# Patient Record
Sex: Male | Born: 1945 | Race: Black or African American | Hispanic: No | Marital: Single | Smoking: Never smoker
Health system: Southern US, Community
[De-identification: ages and names within clinical notes are randomized; demographics above are authoritative.]

## PROBLEM LIST (undated history)

## (undated) ENCOUNTER — Ambulatory Visit (HOSPITAL_COMMUNITY): Disposition: A | Discharge status: Other Acute Inpatient Hospital | Payer: Medicare Other

## (undated) DIAGNOSIS — G20 Parkinson's disease: Secondary | ICD-10-CM

## (undated) DIAGNOSIS — G8929 Other chronic pain: Secondary | ICD-10-CM

## (undated) DIAGNOSIS — M79671 Pain in right foot: Secondary | ICD-10-CM

## (undated) DIAGNOSIS — R200 Anesthesia of skin: Secondary | ICD-10-CM

## (undated) DIAGNOSIS — R339 Retention of urine, unspecified: Secondary | ICD-10-CM

## (undated) DIAGNOSIS — M79672 Pain in left foot: Secondary | ICD-10-CM

## (undated) DIAGNOSIS — Z960 Presence of urogenital implants: Secondary | ICD-10-CM

## (undated) DIAGNOSIS — R0602 Shortness of breath: Secondary | ICD-10-CM

## (undated) DIAGNOSIS — G20A1 Parkinson's disease without dyskinesia, without mention of fluctuations: Secondary | ICD-10-CM

## (undated) DIAGNOSIS — E785 Hyperlipidemia, unspecified: Secondary | ICD-10-CM

## (undated) DIAGNOSIS — K4090 Unilateral inguinal hernia, without obstruction or gangrene, not specified as recurrent: Secondary | ICD-10-CM

## (undated) DIAGNOSIS — I10 Essential (primary) hypertension: Secondary | ICD-10-CM

## (undated) DIAGNOSIS — J189 Pneumonia, unspecified organism: Secondary | ICD-10-CM

## (undated) DIAGNOSIS — Z978 Presence of other specified devices: Secondary | ICD-10-CM

## (undated) HISTORY — PX: TONSILLECTOMY: SUR1361

## (undated) HISTORY — DX: Pain in left foot: M79.672

## (undated) HISTORY — DX: Parkinson's disease: G20

## (undated) HISTORY — DX: Essential (primary) hypertension: I10

## (undated) HISTORY — DX: Parkinson's disease without dyskinesia, without mention of fluctuations: G20.A1

## (undated) HISTORY — PX: COLON SURGERY: SHX602

## (undated) HISTORY — DX: Pain in left foot: M79.671

## (undated) HISTORY — DX: Other chronic pain: G89.29

## (undated) HISTORY — DX: Hyperlipidemia, unspecified: E78.5

---

## 1999-09-18 ENCOUNTER — Encounter: Payer: Self-pay | Admitting: Cardiology

## 1999-09-18 ENCOUNTER — Encounter: Admission: RE | Admit: 1999-09-18 | Discharge: 1999-09-18 | Payer: Self-pay | Admitting: Cardiology

## 1999-11-02 ENCOUNTER — Ambulatory Visit (HOSPITAL_COMMUNITY): Admission: RE | Admit: 1999-11-02 | Discharge: 1999-11-02 | Payer: Self-pay | Admitting: Neurosurgery

## 2003-06-26 ENCOUNTER — Emergency Department (HOSPITAL_COMMUNITY): Admission: EM | Admit: 2003-06-26 | Discharge: 2003-06-26 | Payer: Self-pay | Admitting: Family Medicine

## 2004-09-23 ENCOUNTER — Emergency Department (HOSPITAL_COMMUNITY): Admission: EM | Admit: 2004-09-23 | Discharge: 2004-09-23 | Payer: Self-pay | Admitting: Emergency Medicine

## 2005-09-14 ENCOUNTER — Emergency Department (HOSPITAL_COMMUNITY): Admission: EM | Admit: 2005-09-14 | Discharge: 2005-09-14 | Payer: Self-pay | Admitting: Family Medicine

## 2005-10-27 ENCOUNTER — Emergency Department (HOSPITAL_COMMUNITY): Admission: EM | Admit: 2005-10-27 | Discharge: 2005-10-27 | Payer: Self-pay | Admitting: Emergency Medicine

## 2005-11-04 ENCOUNTER — Emergency Department (HOSPITAL_COMMUNITY): Admission: EM | Admit: 2005-11-04 | Discharge: 2005-11-04 | Payer: Self-pay | Admitting: Emergency Medicine

## 2005-12-13 ENCOUNTER — Emergency Department (HOSPITAL_COMMUNITY): Admission: EM | Admit: 2005-12-13 | Discharge: 2005-12-13 | Payer: Self-pay | Admitting: Emergency Medicine

## 2006-06-25 ENCOUNTER — Emergency Department (HOSPITAL_COMMUNITY): Admission: EM | Admit: 2006-06-25 | Discharge: 2006-06-25 | Payer: Self-pay | Admitting: Emergency Medicine

## 2007-05-22 ENCOUNTER — Emergency Department (HOSPITAL_COMMUNITY): Admission: EM | Admit: 2007-05-22 | Discharge: 2007-05-22 | Payer: Self-pay | Admitting: Family Medicine

## 2009-03-14 ENCOUNTER — Emergency Department (HOSPITAL_COMMUNITY): Admission: EM | Admit: 2009-03-14 | Discharge: 2009-03-14 | Payer: Self-pay | Admitting: Emergency Medicine

## 2009-04-15 ENCOUNTER — Encounter: Admission: RE | Admit: 2009-04-15 | Discharge: 2009-04-15 | Payer: Self-pay | Admitting: Cardiology

## 2009-06-02 ENCOUNTER — Emergency Department (HOSPITAL_COMMUNITY): Admission: EM | Admit: 2009-06-02 | Discharge: 2009-06-03 | Payer: Self-pay | Admitting: Emergency Medicine

## 2009-08-09 ENCOUNTER — Emergency Department (HOSPITAL_COMMUNITY): Admission: EM | Admit: 2009-08-09 | Discharge: 2009-08-09 | Payer: Self-pay | Admitting: Emergency Medicine

## 2009-08-20 ENCOUNTER — Emergency Department (HOSPITAL_COMMUNITY): Admission: EM | Admit: 2009-08-20 | Discharge: 2009-08-20 | Payer: Self-pay | Admitting: Emergency Medicine

## 2009-09-05 ENCOUNTER — Emergency Department (HOSPITAL_COMMUNITY): Admission: EM | Admit: 2009-09-05 | Discharge: 2009-09-05 | Payer: Self-pay | Admitting: Emergency Medicine

## 2009-11-29 ENCOUNTER — Ambulatory Visit (HOSPITAL_COMMUNITY): Admission: RE | Admit: 2009-11-29 | Discharge: 2009-11-29 | Payer: Self-pay | Admitting: Gastroenterology

## 2010-03-03 ENCOUNTER — Encounter
Admission: RE | Admit: 2010-03-03 | Discharge: 2010-03-28 | Payer: Self-pay | Source: Home / Self Care | Attending: Physical Medicine & Rehabilitation | Admitting: Physical Medicine & Rehabilitation

## 2010-03-06 ENCOUNTER — Encounter
Admission: RE | Admit: 2010-03-06 | Discharge: 2010-03-06 | Payer: Self-pay | Source: Home / Self Care | Attending: Neurosurgery | Admitting: Neurosurgery

## 2010-03-07 ENCOUNTER — Ambulatory Visit: Admit: 2010-03-07 | Payer: Self-pay | Admitting: Physical Medicine & Rehabilitation

## 2010-03-19 ENCOUNTER — Encounter: Payer: Self-pay | Admitting: Neurosurgery

## 2010-03-19 ENCOUNTER — Encounter: Payer: Self-pay | Admitting: Cardiology

## 2010-04-25 ENCOUNTER — Ambulatory Visit: Payer: Medicaid Other

## 2010-05-04 ENCOUNTER — Ambulatory Visit: Payer: Medicare Other | Attending: Cardiology

## 2010-05-04 DIAGNOSIS — R262 Difficulty in walking, not elsewhere classified: Secondary | ICD-10-CM | POA: Insufficient documentation

## 2010-05-04 DIAGNOSIS — M25569 Pain in unspecified knee: Secondary | ICD-10-CM | POA: Insufficient documentation

## 2010-05-04 DIAGNOSIS — IMO0001 Reserved for inherently not codable concepts without codable children: Secondary | ICD-10-CM | POA: Insufficient documentation

## 2010-05-04 DIAGNOSIS — M25669 Stiffness of unspecified knee, not elsewhere classified: Secondary | ICD-10-CM | POA: Insufficient documentation

## 2010-05-08 ENCOUNTER — Ambulatory Visit: Payer: Medicare Other

## 2010-05-11 ENCOUNTER — Ambulatory Visit: Payer: Medicare Other

## 2010-05-14 LAB — URINALYSIS, ROUTINE W REFLEX MICROSCOPIC
Bilirubin Urine: NEGATIVE
Bilirubin Urine: NEGATIVE
Glucose, UA: NEGATIVE mg/dL
Glucose, UA: NEGATIVE mg/dL
Ketones, ur: NEGATIVE mg/dL
Ketones, ur: NEGATIVE mg/dL
Leukocytes, UA: NEGATIVE
Nitrite: POSITIVE — AB
Nitrite: NEGATIVE
Protein, ur: 30 mg/dL — AB
Protein, ur: 100 mg/dL — AB
Specific Gravity, Urine: 1.008 (ref 1.005–1.030)
Specific Gravity, Urine: 1.013 (ref 1.005–1.030)
Urobilinogen, UA: 1 mg/dL (ref 0.0–1.0)
Urobilinogen, UA: 1 mg/dL (ref 0.0–1.0)
pH: 6.5 (ref 5.0–8.0)
pH: 5.5 (ref 5.0–8.0)

## 2010-05-14 LAB — URINE MICROSCOPIC-ADD ON

## 2010-05-14 LAB — URINE CULTURE: Colony Count: >=100000

## 2010-05-15 LAB — URINE MICROSCOPIC-ADD ON

## 2010-05-15 LAB — URINE CULTURE
Colony Count: NO GROWTH
Culture: NO GROWTH

## 2010-05-15 LAB — URINALYSIS, ROUTINE W REFLEX MICROSCOPIC
Bilirubin Urine: NEGATIVE
Glucose, UA: 250 mg/dL — AB
Ketones, ur: NEGATIVE mg/dL
Leukocytes, UA: NEGATIVE
Nitrite: NEGATIVE
Protein, ur: 100 mg/dL — AB
Specific Gravity, Urine: 1.013 (ref 1.005–1.030)
Urobilinogen, UA: 1 mg/dL (ref 0.0–1.0)
pH: 6.5 (ref 5.0–8.0)

## 2010-05-15 LAB — GLUCOSE, CAPILLARY: Glucose-Capillary: 138 mg/dL — ABNORMAL HIGH (ref 70–99)

## 2010-09-22 ENCOUNTER — Other Ambulatory Visit (HOSPITAL_COMMUNITY): Payer: Self-pay | Admitting: Cardiology

## 2010-10-09 ENCOUNTER — Encounter (HOSPITAL_COMMUNITY)
Admission: RE | Admit: 2010-10-09 | Discharge: 2010-10-09 | Disposition: A | Discharge status: Home / Self Care | Payer: Medicare Other | Source: Ambulatory Visit | Attending: Cardiology | Admitting: Cardiology

## 2010-10-09 DIAGNOSIS — R079 Chest pain, unspecified: Secondary | ICD-10-CM | POA: Insufficient documentation

## 2010-10-09 MED ORDER — TECHNETIUM TC 99M TETROFOSMIN IV KIT
10.0000 | PACK | Freq: Once | INTRAVENOUS | Status: AC | PRN
  Administered 2010-10-09: 10 via INTRAVENOUS

## 2010-10-09 MED ORDER — TECHNETIUM TC 99M TETROFOSMIN IV KIT
30.0000 | PACK | Freq: Once | INTRAVENOUS | Status: AC | PRN
  Administered 2010-10-09: 30 via INTRAVENOUS

## 2010-11-07 ENCOUNTER — Other Ambulatory Visit: Payer: Self-pay | Admitting: Family Medicine

## 2010-11-07 ENCOUNTER — Ambulatory Visit
Admission: RE | Admit: 2010-11-07 | Discharge: 2010-11-07 | Disposition: A | Discharge status: Home / Self Care | Payer: Medicare Other | Source: Ambulatory Visit | Attending: Family Medicine | Admitting: Family Medicine

## 2010-11-07 DIAGNOSIS — M545 Low back pain, unspecified: Secondary | ICD-10-CM

## 2010-11-07 DIAGNOSIS — M542 Cervicalgia: Secondary | ICD-10-CM

## 2010-11-07 DIAGNOSIS — I10 Essential (primary) hypertension: Secondary | ICD-10-CM

## 2010-11-07 DIAGNOSIS — M25569 Pain in unspecified knee: Secondary | ICD-10-CM

## 2011-01-03 ENCOUNTER — Encounter: Payer: Self-pay | Admitting: Family Medicine

## 2011-01-05 ENCOUNTER — Ambulatory Visit (INDEPENDENT_AMBULATORY_CARE_PROVIDER_SITE_OTHER): Payer: Medicare Other | Admitting: Family Medicine

## 2011-01-05 ENCOUNTER — Encounter: Payer: Self-pay | Admitting: Family Medicine

## 2011-01-05 DIAGNOSIS — I10 Essential (primary) hypertension: Secondary | ICD-10-CM | POA: Insufficient documentation

## 2011-01-05 DIAGNOSIS — F419 Anxiety disorder, unspecified: Secondary | ICD-10-CM

## 2011-01-05 DIAGNOSIS — F411 Generalized anxiety disorder: Secondary | ICD-10-CM

## 2011-01-05 DIAGNOSIS — G8929 Other chronic pain: Secondary | ICD-10-CM

## 2011-01-05 DIAGNOSIS — E119 Type 2 diabetes mellitus without complications: Secondary | ICD-10-CM

## 2011-01-05 DIAGNOSIS — R7303 Prediabetes: Secondary | ICD-10-CM | POA: Insufficient documentation

## 2011-01-05 LAB — POCT GLYCOSYLATED HEMOGLOBIN (HGB A1C): Hemoglobin A1C: 7.5

## 2011-01-05 MED ORDER — HYDROCHLOROTHIAZIDE 25 MG PO TABS: 25.0000 mg | ORAL_TABLET | Freq: Every day | ORAL | Status: DC

## 2011-01-05 NOTE — Assessment & Plan Note (Addendum)
Patient does not know why he takes Klonopin. He thinks that he may have been anxious at one point.  Last took Klonopin this morning. Will address this issue at the next visit in 2-4 weeks

## 2011-01-05 NOTE — Assessment & Plan Note (Signed)
Not well-controlled currently on ARB and calcium channel blocker.  Plan add hydrochlorothiazide and recheck in 2 weeks. We'll also check comprehensive metabolic panel today.

## 2011-01-05 NOTE — Assessment & Plan Note (Signed)
A1c in range. No changes planned. Followup in 3 months or sooner

## 2011-01-05 NOTE — Patient Instructions (Signed)
Thank you for coming in today. We will get some blood and urine today for labs.  Please start taking HCTZ too.  Come see me in 2-4 weeks to follow up your blood pressure and your pain.  We will treat your chronic pain but we will do it by the book.  First we need to get a urine drug screen.

## 2011-01-05 NOTE — Assessment & Plan Note (Addendum)
Has not been taking Vicodin for several months. At this point I cannot confirm any of his story. Will obtain urine drug screen today. [facility] drug database reviewed, and scanned into record.  Only clonazepam in the last year. In return visit in 2-4 weeks will discuss pain control options. At this point I am reluctant to start narcotics until a trial of physical therapy has happened. I will be happy to continue to prescribe tramadol.

## 2011-01-05 NOTE — Progress Notes (Addendum)
Here to meet the new doctor and to discuss his HTN, DM, and Chronic Pain.   1) HTN:  Taking medications as listed below. Denies any palpitations chest pains dyspnea syncope or edema. Feels well otherwise  2) diabetes: Taking medications listed below isn't checking sugar. Denies any symptoms such as polyuria or polydipsia. Feels well otherwise. Lab Results  Component Value Date   HGBA1C 7.5 01/05/2011    3) chronic pain: Previously was managed by other physicians with Vicodin and tramadol.  Cannot remember if he's ever been to physical therapy. Denies any surgeries. Denies any weakness numbness tingling in his legs. He notes that his pain occurs in his lumbar back neck and bilateral knees.  Denies any history of drug abuse.   PMH reviewed.  ROS as above otherwise neg Medications reviewed. Current Outpatient Prescriptions  Medication Sig Dispense Refill  . amLODipine-benazepril (LOTREL) 10-20 MG per capsule Take 1 capsule by mouth daily.        . clonazePAM (KLONOPIN) 0.5 MG tablet Take 0.5 mg by mouth 2 (two) times daily as needed.        Marland Kitchen glimepiride (AMARYL) 4 MG tablet Take 4 mg by mouth daily before breakfast.        . HYDROcodone-acetaminophen (LORTAB) 7.5-500 MG per tablet Take 1 tablet by mouth every 6 (six) hours as needed.        Marland Kitchen ibuprofen (ADVIL,MOTRIN) 800 MG tablet Take 800 mg by mouth every 8 (eight) hours as needed.        . metFORMIN (GLUCOPHAGE) 1000 MG tablet Take 1,000 mg by mouth 2 (two) times daily with a meal.        . metoprolol (TOPROL XL) 50 MG 24 hr tablet Take 50 mg by mouth daily.       . traMADol (ULTRAM) 50 MG tablet Take 50 mg by mouth 2 (two) times daily as needed. Maximum dose= 8 tablets per day       . hydrochlorothiazide (HYDRODIURIL) 25 MG tablet Take 1 tablet (25 mg total) by mouth daily.  30 tablet  1     Exam:  BP 170/100  Pulse 85  Ht 5\' 10"  (1.778 m)  Wt 219 lb (99.338 kg)  BMI 31.42 kg/m2 Gen: Well NAD HEENT: EOMI,  MMM Lungs: CTABL Nl  WOB Heart: RRR no MRG Abd: NABS, NT, ND Exts: Non edematous BL  LE, warm and well perfused. Diabetic foot exam shows callus buildup dead skin buildup and no monofilament sensation bilaterally. No ulcers noted MSK: Nontender over entire spinal midline. Bilateral paraspinal tenderness along the lumbar sacral region bilaterally. Normal gait normal reflexes and sensation in legs bilaterally. Patient is wearing a back brace.

## 2011-01-06 LAB — COMPLETE METABOLIC PANEL WITH GFR
ALT: 19 U/L (ref 0–53)
AST: 23 U/L (ref 0–37)
Albumin: 3.9 g/dL (ref 3.5–5.2)
Alkaline Phosphatase: 68 U/L (ref 39–117)
BUN: 9 mg/dL (ref 6–23)
CO2: 29 mEq/L (ref 19–32)
Calcium: 9.9 mg/dL (ref 8.4–10.5)
Chloride: 101 mEq/L (ref 96–112)
Creat: 0.98 mg/dL (ref 0.50–1.35)
GFR, Est African American: >89 mL/min (ref >89)
GFR, Est Non African American: 82 mL/min — ABNORMAL LOW (ref >89)
Glucose, Bld: 185 mg/dL — ABNORMAL HIGH (ref 70–99)
Potassium: 3.1 mEq/L — ABNORMAL LOW (ref 3.5–5.3)
Sodium: 142 mEq/L (ref 135–145)
Total Bilirubin: 0.5 mg/dL (ref 0.3–1.2)
Total Protein: 7.3 g/dL (ref 6.0–8.3)

## 2011-01-06 LAB — DRUG SCREEN, URINE
Amphetamine Screen, Ur: NEGATIVE
Barbiturate Quant, Ur: NEGATIVE
Benzodiazepines.: NEGATIVE
Cocaine Metabolites: NEGATIVE
Creatinine,U: 119.2 mg/dL
Marijuana Metabolite: NEGATIVE
Methadone: NEGATIVE
Opiates: NEGATIVE
Phencyclidine (PCP): NEGATIVE
Propoxyphene: NEGATIVE

## 2011-01-26 ENCOUNTER — Telehealth: Payer: Self-pay | Admitting: Family Medicine

## 2011-01-26 ENCOUNTER — Encounter: Payer: Self-pay | Admitting: Family Medicine

## 2011-01-26 ENCOUNTER — Ambulatory Visit (INDEPENDENT_AMBULATORY_CARE_PROVIDER_SITE_OTHER): Payer: Medicare Other | Admitting: Family Medicine

## 2011-01-26 DIAGNOSIS — Z23 Encounter for immunization: Secondary | ICD-10-CM

## 2011-01-26 DIAGNOSIS — L989 Disorder of the skin and subcutaneous tissue, unspecified: Secondary | ICD-10-CM | POA: Insufficient documentation

## 2011-01-26 DIAGNOSIS — G8929 Other chronic pain: Secondary | ICD-10-CM

## 2011-01-26 DIAGNOSIS — E119 Type 2 diabetes mellitus without complications: Secondary | ICD-10-CM

## 2011-01-26 DIAGNOSIS — I10 Essential (primary) hypertension: Secondary | ICD-10-CM

## 2011-01-26 DIAGNOSIS — B353 Tinea pedis: Secondary | ICD-10-CM

## 2011-01-26 DIAGNOSIS — N529 Male erectile dysfunction, unspecified: Secondary | ICD-10-CM

## 2011-01-26 MED ORDER — AMLODIPINE BESY-BENAZEPRIL HCL 10-20 MG PO CAPS: 1.0000 | ORAL_CAPSULE | Freq: Every day | ORAL | Status: DC

## 2011-01-26 MED ORDER — KETOCONAZOLE 2 % EX CREA: TOPICAL_CREAM | Freq: Every day | CUTANEOUS | Status: DC

## 2011-01-26 MED ORDER — TRAMADOL HCL 50 MG PO TABS: 50.0000 mg | ORAL_TABLET | Freq: Three times a day (TID) | ORAL | Status: DC | PRN

## 2011-01-26 MED ORDER — HYDROCODONE-ACETAMINOPHEN 7.5-750 MG PO TABS: 1.0000 | ORAL_TABLET | Freq: Every day | ORAL | Status: DC | PRN

## 2011-01-26 NOTE — Assessment & Plan Note (Signed)
See DM section for discussion. Will follow.

## 2011-01-26 NOTE — Patient Instructions (Addendum)
Thank you for coming in today. Please start taking all you blood pressure medications.  Amlodipine/Benazepril, Hydrochlorathioazide, and Metoprolol.  I prescribed hydrocodone 7.5/750 with and tramadol with refills.  Clean and inspect your feet daily.  I prescribed some ketoconazole cream to use every day to keep the fungus down.  Come see me in 1-2 months for follow up.

## 2011-01-26 NOTE — Assessment & Plan Note (Signed)
Organic impotence. We'll fill out form for vacuum erection system, and send copy of note in with it. We'll followup in one or 2 months

## 2011-01-26 NOTE — Assessment & Plan Note (Signed)
Blood pressure significantly elevated today. However he is asymptomatic. I think this is likely due to medication nonadherence. Plan to repeat amlodipine/benazepril and emphasize the importance of taking all of his blood pressure medications. We carefully reviewed what his medicines are and what to do. Plan to followup in one to 2 months.

## 2011-01-26 NOTE — Assessment & Plan Note (Addendum)
Chronic pain. Urine drug screen and [place] and drug database appear to be consistent with his story. Plan to prescribe tramadol +30 tablets of hydrocodone 7.5/750. Encouraged patient to reduce dose of ibuprofen as this is interfering with his blood pressure control. Plan to followup in one to 2 months Prescription use agreement filled out and scanned into chart

## 2011-01-26 NOTE — Progress Notes (Signed)
Addended by: Jennette Bill on: 01/26/2011 11:12 AM   Modules accepted: Orders

## 2011-01-26 NOTE — Assessment & Plan Note (Signed)
Diabetes is well controlled on metformin and Amaryl. However his feet are not good condition. He doesn't have any ulcers but does have tinea in his feet.  Plan: Discussed good foot hygiene and prescribe ketoconazole for tinea. Followup in one month

## 2011-01-26 NOTE — Telephone Encounter (Signed)
Have form for penis pump. We need to schedule an appointment to justify this device for medicare. Will need an appt. I left a message stating this.

## 2011-01-26 NOTE — Progress Notes (Signed)
Mr. Tyler Lester presents to clinic today for blood pressure, impotence, back pain, diabetes.  #1 blood pressure: Taking hydrochlorothiazide and perhaps metoprolol but is not taking amlodipine/benazepril.  No palpitations dyspnea or significant edema. No syncope either. Feels well has trouble recalling which medicines to which.  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 He has no erections in the morning and is not masturbating.  He thinks a penis pump may help.    #3 chronic back pain. Tramadol does help but not quite enough. He thinks hydrocodone 7.5/750 one tablet a day as needed is sufficient in addition to his tramadol. He additionally is taking ibuprofen for back pain. He denies any radiculopathy weakness or numbness. He additionally denies any bladder or bowel dysfunction.  #4 diabetes: Taking metformin and Amaryl. His last A1c was 7.5 in early November.  No lows polyuria or polydipsia.  He does not inspect his feet and has not been to a podiatrist.  PMH reviewed.  ROS as above otherwise neg Medications reviewed. Current Outpatient Prescriptions  Medication Sig Dispense Refill  . clonazePAM (KLONOPIN) 0.5 MG tablet Take 0.5 mg by mouth 2 (two) times daily as needed.        Marland Kitchen glimepiride (AMARYL) 4 MG tablet Take 4 mg by mouth daily before breakfast.        . hydrochlorothiazide (HYDRODIURIL) 25 MG tablet Take 1 tablet (25 mg total) by mouth daily.  30 tablet  1  . ibuprofen (ADVIL,MOTRIN) 800 MG tablet Take 800 mg by mouth every 8 (eight) hours as needed.        . metFORMIN (GLUCOPHAGE) 1000 MG tablet Take 1,000 mg by mouth 2 (two) times daily with a meal.        . metoprolol (TOPROL XL) 50 MG 24 hr tablet Take 50 mg by mouth daily.       . traMADol (ULTRAM) 50 MG tablet Take 1 tablet (50 mg total) by mouth every 8 (eight) hours as needed. Maximum dose= 8 tablets per day  60 tablet  1  . DISCONTD: traMADol (ULTRAM) 50 MG tablet Take 50 mg by mouth 2  (two) times daily as needed. Maximum dose= 8 tablets per day       . amLODipine-benazepril (LOTREL) 10-20 MG per capsule Take 1 capsule by mouth daily.  30 capsule  3  . HYDROcodone-acetaminophen (VICODIN ES) 7.5-750 MG per tablet Take 1 tablet by mouth daily as needed for pain.  30 tablet  1  . ketoconazole (NIZORAL) 2 % cream Apply topically daily.  75 g  12  . DISCONTD: amLODipine-benazepril (LOTREL) 10-20 MG per capsule Take 1 capsule by mouth daily.        Marland Kitchen DISCONTD: HYDROcodone-acetaminophen (LORTAB) 7.5-500 MG per tablet Take 1 tablet by mouth every 6 (six) hours as needed.          Exam:  BP 180/96  Pulse 80  Temp(Src) 97.9 F (36.6 C) (Oral)  Ht 5\' 10"  (1.778 m)  Wt 219 lb (99.338 kg)  BMI 31.42 kg/m2 Gen: Well NAD HEENT: EOMI,  MMM Lungs: CTABL Nl WOB Heart: RRR no MRG Abd: NABS, NT, ND Exts: Feet with tinea bilaterally.  Thickened nails. 0/4 to monofilament filament on right and 2/4 to monofilament on left. No ulcers noted dorsal pedis and posterior tibialis pulses are 2+ bilaterally. MSK: Nontender over spinal midline. Bilateral paraspinal lumbar tenderness to palpation. Strength reflexes and sensation in both legs are normal and equal. Normal  gait.

## 2011-02-01 ENCOUNTER — Telehealth: Payer: Self-pay | Admitting: Family Medicine

## 2011-02-01 NOTE — Telephone Encounter (Signed)
[name], Renato Battles

## 2011-02-01 NOTE — Telephone Encounter (Signed)
Pt called asking for a replacement on his vicodin, says he misplaced it, advised pt we cannot replace lost/stolen prescriptions per our practice policy, pt says he will keep looking, sending this as an [place]

## 2011-02-21 ENCOUNTER — Telehealth: Payer: Self-pay | Admitting: Family Medicine

## 2011-02-21 MED ORDER — PANTOPRAZOLE SODIUM 40 MG PO TBEC: 40.0000 mg | DELAYED_RELEASE_TABLET | Freq: Every day | ORAL | Status: DC

## 2011-02-21 NOTE — Telephone Encounter (Signed)
Required for Nexium. Form given to Dr. Denyse Amass

## 2011-02-22 NOTE — Telephone Encounter (Signed)
Patient notified that Protonix is ready at pharmacy.

## 2011-03-02 ENCOUNTER — Other Ambulatory Visit: Payer: Self-pay | Admitting: Family Medicine

## 2011-03-02 MED ORDER — SILDENAFIL CITRATE 100 MG PO TABS: 50.0000 mg | ORAL_TABLET | Freq: Every day | ORAL | Status: DC | PRN

## 2011-03-12 ENCOUNTER — Ambulatory Visit: Payer: Medicare Other | Admitting: Family Medicine

## 2011-03-16 ENCOUNTER — Ambulatory Visit: Payer: Medicare Other | Admitting: Family Medicine

## 2011-03-26 ENCOUNTER — Ambulatory Visit: Payer: Medicare Other | Admitting: Family Medicine

## 2011-03-28 ENCOUNTER — Encounter: Payer: Self-pay | Admitting: Family Medicine

## 2011-03-28 ENCOUNTER — Ambulatory Visit (INDEPENDENT_AMBULATORY_CARE_PROVIDER_SITE_OTHER): Payer: Medicare Other | Admitting: Family Medicine

## 2011-03-28 VITALS — BP 158/83 | HR 96 | Ht 68.0 in | Wt 214.0 lb

## 2011-03-28 DIAGNOSIS — E1149 Type 2 diabetes mellitus with other diabetic neurological complication: Secondary | ICD-10-CM

## 2011-03-28 DIAGNOSIS — I10 Essential (primary) hypertension: Secondary | ICD-10-CM

## 2011-03-28 DIAGNOSIS — G8929 Other chronic pain: Secondary | ICD-10-CM

## 2011-03-28 DIAGNOSIS — E1142 Type 2 diabetes mellitus with diabetic polyneuropathy: Secondary | ICD-10-CM

## 2011-03-28 MED ORDER — HYDROCODONE-ACETAMINOPHEN 7.5-750 MG PO TABS: 1.0000 | ORAL_TABLET | Freq: Every day | ORAL | Status: DC | PRN

## 2011-03-28 NOTE — Assessment & Plan Note (Signed)
Chronic pain doing well on low-dose intermittent Vicodin. Plan to refill today 30 tabs with one refill.  Followup in one month.  If his medication needs remain stable I feel this is a safe level for this practice.  Discuss red flags such as bowel or bladder problems weakness or numbness.

## 2011-03-28 NOTE — Patient Instructions (Addendum)
Thank you for coming in today. Get a pill box and make sure you are taking your medicines.  Come back in 1 month for a recheck.  I would like to make sure you blood pressure is not good on all your medicines before we start adding some.  I refilled your pain medicine.  Come back in 1 month for a diabetes check then too.

## 2011-03-28 NOTE — Progress Notes (Signed)
Tyler Lester is a 66 y.o. male who presents to [facility] today for   1) hypertension:  He is pretty sure that he's taking his blood pressure medicines however he can't name any of them and does not use a pillbox.  He feels well however without any chest pains palpitations dyspnea.    2) diabetes: Takes his Amaryl and metformin. No hyperglycemic episodes no hypoglycemia feels well. Lab Results  Component Value Date   HGBA1C 7.5 01/05/2011    3) chronic back pain  doing well with ibuprofen and intermittent Vicodin. He says that he uses Vicodin one to 2 pills every other day as needed. He denies any worsening pain bowel or bladder dysfunction constipation nausea vomiting. He says that his pain medications allow him to remain functional at home and exercise.  He feels well.   PMH reviewed. Significant for diabetes hypertension and chronic pain ROS as above otherwise neg Medications reviewed. Current Outpatient Prescriptions  Medication Sig Dispense Refill  . amLODipine-benazepril (LOTREL) 10-20 MG per capsule Take 1 capsule by mouth daily.  30 capsule  3  . clonazePAM (KLONOPIN) 0.5 MG tablet Take 0.5 mg by mouth 2 (two) times daily as needed.        Marland Kitchen glimepiride (AMARYL) 4 MG tablet Take 4 mg by mouth daily before breakfast.        . hydrochlorothiazide (HYDRODIURIL) 25 MG tablet Take 25 mg by mouth daily.      Marland Kitchen HYDROcodone-acetaminophen (VICODIN ES) 7.5-750 MG per tablet Take 1 tablet by mouth daily as needed for pain.  30 tablet  1  . ibuprofen (ADVIL,MOTRIN) 800 MG tablet Take 800 mg by mouth every 8 (eight) hours as needed.        Marland Kitchen ketoconazole (NIZORAL) 2 % cream Apply topically daily.  75 g  12  . metFORMIN (GLUCOPHAGE) 1000 MG tablet Take 1,000 mg by mouth 2 (two) times daily with a meal.        . metoprolol (TOPROL XL) 50 MG 24 hr tablet Take 50 mg by mouth daily.       . pantoprazole (PROTONIX) 40 MG tablet Take 1 tablet (40 mg total) by mouth daily.  30 tablet  12  . sildenafil  (VIAGRA) 100 MG tablet Take 0.5-1 tablets (50-100 mg total) by mouth daily as needed for erectile dysfunction.  5 tablet  11  . traMADol (ULTRAM) 50 MG tablet Take 1 tablet (50 mg total) by mouth every 8 (eight) hours as needed. Maximum dose= 8 tablets per day  60 tablet  1  . DISCONTD: HYDROcodone-acetaminophen (VICODIN ES) 7.5-750 MG per tablet Take 1 tablet by mouth daily as needed for pain.  30 tablet  1    Exam:  BP 158/83  Pulse 96  Ht 5\' 8"  (1.727 m)  Wt 214 lb (97.07 kg)  BMI 32.54 kg/m2 Gen: Well NAD HEENT: EOMI,  MMM Lungs: CTABL Nl WOB Heart: RRR no MRG Abd: NABS, NT, ND MSK: Nontender over spinal midline. Bilateral paraspinal lumbar tenderness to palpation. Strength reflexes and sensation in both legs are normal and equal. Normal gait.

## 2011-03-28 NOTE — Assessment & Plan Note (Signed)
A1c at goal in November. He is due again in February 9 for a repeat A1c. Plan to continue metformin and Amaryl.  Followup in one month

## 2011-03-28 NOTE — Assessment & Plan Note (Signed)
Blood pressure never well controlled today on multiple drug therapy. I am very highly suspicious of accidental medication nonadherence.  He is on multiple medicines and does not use a pillbox and cannot really name his blood pressure medications.  Encouraged patient strongly to use a pillbox to ensure compliance with drug therapy. Plan to followup in one month. We'll consider increasing her Toprol at that point as his heart rate is in the 90s.  Patient expresses understanding

## 2011-03-30 ENCOUNTER — Other Ambulatory Visit: Payer: Self-pay | Admitting: Family Medicine

## 2011-03-30 MED ORDER — PANTOPRAZOLE SODIUM 40 MG PO TBEC: 40.0000 mg | DELAYED_RELEASE_TABLET | Freq: Every day | ORAL | Status: DC

## 2011-05-14 ENCOUNTER — Ambulatory Visit: Payer: Medicare Other | Admitting: Family Medicine

## 2011-05-17 ENCOUNTER — Ambulatory Visit (INDEPENDENT_AMBULATORY_CARE_PROVIDER_SITE_OTHER): Payer: Medicare Other | Admitting: Family Medicine

## 2011-05-17 VITALS — BP 123/76 | HR 80 | Temp 97.9°F | Ht 68.0 in | Wt 216.4 lb

## 2011-05-17 DIAGNOSIS — H60399 Other infective otitis externa, unspecified ear: Secondary | ICD-10-CM

## 2011-05-17 DIAGNOSIS — H6090 Unspecified otitis externa, unspecified ear: Secondary | ICD-10-CM

## 2011-05-17 DIAGNOSIS — J069 Acute upper respiratory infection, unspecified: Secondary | ICD-10-CM | POA: Insufficient documentation

## 2011-05-17 MED ORDER — HYDROCORTISONE-ACETIC ACID 1-2 % OT SOLN: 4.0000 [drp] | Freq: Three times a day (TID) | OTIC | Status: AC

## 2011-05-17 NOTE — Assessment & Plan Note (Signed)
Left otitis externa.  Vosol HC, advised to discontinue qtip sue.

## 2011-05-17 NOTE — Patient Instructions (Signed)
I sent ear drops to your pharmacy for your left ear  Cough and nasal congestion are due to cold virus.  Please let us know if you have worsening cough or start to have fever, or doe not improve.  Upper Respiratory Infection, Adult An upper respiratory infection (URI) is also known as the common cold. It is often caused by a type of germ (virus). Colds are easily spread (contagious). You can pass it to others by kissing, coughing, sneezing, or drinking out of the same glass. Usually, you get better in 1 or 2 weeks.   HOME CARE    Only take medicine as told by your doctor.   Use a warm mist humidifier or breathe in steam from a hot shower.   Drink enough water and fluids to keep your pee (urine) clear or pale yellow.   Get plenty of rest.   Return to work when your temperature is back to normal or as told by your doctor. You may use a face mask and wash your hands to stop your cold from spreading.  GET HELP RIGHT AWAY IF:    After the first few days, you feel you are getting worse.   You have questions about your medicine.   You have chills, shortness of breath, or brown or red spit (mucus).   You have yellow or brown snot (nasal discharge) or pain in the face, especially when you bend forward.   You have a fever, puffy (swollen) neck, pain when you swallow, or white spots in the back of your throat.   You have a bad headache, ear pain, sinus pain, or chest pain.   You have a high-pitched whistling sound when you breathe in and out (wheezing).   You have a lasting cough or cough up blood.   You have sore muscles or a stiff neck.  MAKE SURE YOU:    Understand these instructions.   Will watch your condition.   Will get help right away if you are not doing well or get worse.  Document Released: 08/01/2007 Document Revised: 02/01/2011 Document Reviewed: 06/19/2010 [facility] Patient Information 2012 [place], [place].

## 2011-05-17 NOTE — Assessment & Plan Note (Signed)
Viral, Discussed symptomatic treatment and red flags for return.

## 2011-05-17 NOTE — Progress Notes (Signed)
  Subjective:    Patient ID: Tyler Lester, male    DOB: 28-Jun-1945, 66 y.o.   MRN: [id number]  HPI 30 minutes late for workin appt  approx  Week of nasal congestion, facial pressure.  Some cough, nonproductive,  No fever, chills.  Some dyspnea.  No emesis or diarrhea.  I have reviewed patient's  PMH, FH, and Social history and Medications as related to this visit.    Review of SystemsGeneral:  Negative for fever, chills, malaise, myalgias         Objective:   Physical Exam GEN: Alert & Oriented, No acute distress, psychomotor slowing HEENT: [place]/AT. EOMI, PERRLA- 2mm-, no conjunctival injection or scleral icterus.  Bilateral tympanic membranes intact without erythema or effusion.  Left canal with otitis externa.  Nares without edema or rhinorrhea.  Oropharynx is without erythema or exudates.  No anterior or posterior cervical lymphadenopathy. CV:  Regular Rate & Rhythm, no murmur Respiratory:  Normal work of breathing, CTAB        Assessment & Plan:

## 2011-05-29 ENCOUNTER — Telehealth: Payer: Self-pay | Admitting: Family Medicine

## 2011-05-29 NOTE — Telephone Encounter (Signed)
Fwd to PCP to address. Lorenda Hatchet, Renato Battles

## 2011-05-29 NOTE — Telephone Encounter (Signed)
Patient is calling because [facility] has not heard back on the refill for Hydrocodone.  He said they have been faxing it since last week Thursday.

## 2011-05-30 NOTE — Telephone Encounter (Signed)
Pt has appt 4/9 w/Dr Denyse Amass

## 2011-05-30 NOTE — Telephone Encounter (Signed)
He never followed up with me. No more refills until a visit. Route to red team to call patient.

## 2011-05-30 NOTE — Telephone Encounter (Signed)
LMOM for pt to rt call. Please let pt know msg per Dr Denyse Amass.

## 2011-06-05 ENCOUNTER — Ambulatory Visit: Payer: Medicare Other | Admitting: Family Medicine

## 2011-06-08 ENCOUNTER — Telehealth: Payer: Self-pay | Admitting: *Deleted

## 2011-06-08 NOTE — Telephone Encounter (Signed)
Received request for PCP change form.  Form states patient is requesting Dr. Armen Pickup and no reason listed for requested change.  Called patient and left message to call our office back.  Gaylene Brooks, RN

## 2011-06-15 ENCOUNTER — Ambulatory Visit: Payer: Medicare Other | Admitting: Family Medicine

## 2011-06-20 NOTE — Telephone Encounter (Signed)
Called patient and left message to call our office back.  Trason Shifflet Ann, RN  

## 2011-06-20 NOTE — Telephone Encounter (Signed)
Patient called back.  Patient was question about reason for form request and stated that he takes Vicodin for pain and needs to get refill.  Last office visit was 02/2011.  Scheduled follow-up appt with Dr. Denyse Amass for 07/04/11 at 3:15pm for diabetes/pain med.  Can disregard PCP change request and patient will continue to see Dr. Denyse Amass.  Gaylene Brooks, RN

## 2011-06-20 NOTE — Telephone Encounter (Signed)
OK will follow up

## 2011-06-29 ENCOUNTER — Ambulatory Visit: Payer: Medicare Other | Admitting: Family Medicine

## 2011-07-04 ENCOUNTER — Ambulatory Visit (INDEPENDENT_AMBULATORY_CARE_PROVIDER_SITE_OTHER): Payer: Medicare Other | Admitting: Family Medicine

## 2011-07-04 ENCOUNTER — Encounter: Payer: Self-pay | Admitting: Family Medicine

## 2011-07-04 VITALS — BP 185/98 | HR 82 | Ht 68.0 in | Wt 223.0 lb

## 2011-07-04 DIAGNOSIS — I10 Essential (primary) hypertension: Secondary | ICD-10-CM

## 2011-07-04 DIAGNOSIS — E1149 Type 2 diabetes mellitus with other diabetic neurological complication: Secondary | ICD-10-CM

## 2011-07-04 DIAGNOSIS — E1142 Type 2 diabetes mellitus with diabetic polyneuropathy: Secondary | ICD-10-CM

## 2011-07-04 DIAGNOSIS — K219 Gastro-esophageal reflux disease without esophagitis: Secondary | ICD-10-CM | POA: Insufficient documentation

## 2011-07-04 DIAGNOSIS — R5381 Other malaise: Secondary | ICD-10-CM

## 2011-07-04 DIAGNOSIS — R5383 Other fatigue: Secondary | ICD-10-CM

## 2011-07-04 DIAGNOSIS — G8929 Other chronic pain: Secondary | ICD-10-CM

## 2011-07-04 LAB — TSH: TSH: 1.172 u[IU]/mL (ref 0.350–4.500)

## 2011-07-04 LAB — POCT GLYCOSYLATED HEMOGLOBIN (HGB A1C): Hemoglobin A1C: 7.2

## 2011-07-04 LAB — LDL CHOLESTEROL, DIRECT: Direct LDL: 85 mg/dL

## 2011-07-04 MED ORDER — HYDROCHLOROTHIAZIDE 25 MG PO TABS: ORAL_TABLET | ORAL | Status: DC

## 2011-07-04 MED ORDER — METOPROLOL SUCCINATE ER 50 MG PO TB24: ORAL_TABLET | ORAL | Status: DC

## 2011-07-04 MED ORDER — TRAMADOL HCL 50 MG PO TABS: 50.0000 mg | ORAL_TABLET | Freq: Three times a day (TID) | ORAL | Status: DC | PRN

## 2011-07-04 MED ORDER — AMLODIPINE BESY-BENAZEPRIL HCL 10-20 MG PO CAPS: ORAL_CAPSULE | ORAL | Status: DC

## 2011-07-04 MED ORDER — GLIMEPIRIDE 4 MG PO TABS: ORAL_TABLET | ORAL | Status: DC

## 2011-07-04 MED ORDER — PANTOPRAZOLE SODIUM 40 MG PO TBEC: 40.0000 mg | DELAYED_RELEASE_TABLET | Freq: Every day | ORAL | Status: DC

## 2011-07-04 MED ORDER — METFORMIN HCL 1000 MG PO TABS: ORAL_TABLET | ORAL | Status: DC

## 2011-07-04 MED ORDER — HYDROCODONE-ACETAMINOPHEN 7.5-750 MG PO TABS: 1.0000 | ORAL_TABLET | Freq: Every day | ORAL | Status: DC | PRN

## 2011-07-04 NOTE — Patient Instructions (Signed)
Thank you for coming in today. We will do some tests today to see why you are tired.  I called in all your prescriptions.  Please se me in 2-4 weeks to follow up your fatigue.  Call or go to the emergency room if you get worse, have trouble breathing, have chest pains, or palpitations.  Come back or go to the emergency room if you notice new weakness new numbness problems walking or bowel or bladder problems.

## 2011-07-05 ENCOUNTER — Encounter: Payer: Self-pay | Admitting: Family Medicine

## 2011-07-05 DIAGNOSIS — R5383 Other fatigue: Secondary | ICD-10-CM | POA: Insufficient documentation

## 2011-07-05 LAB — COMPLETE METABOLIC PANEL WITH GFR
ALT: 17 U/L (ref 0–53)
AST: 20 U/L (ref 0–37)
Albumin: 3.9 g/dL (ref 3.5–5.2)
Alkaline Phosphatase: 51 U/L (ref 39–117)
BUN: 13 mg/dL (ref 6–23)
CO2: 29 mEq/L (ref 19–32)
Calcium: 8.9 mg/dL (ref 8.4–10.5)
Chloride: 108 mEq/L (ref 96–112)
Creat: 0.93 mg/dL (ref 0.50–1.35)
GFR, Est African American: >89 mL/min
GFR, Est Non African American: 87 mL/min
Glucose, Bld: 155 mg/dL — ABNORMAL HIGH (ref 70–99)
Potassium: 3.4 mEq/L — ABNORMAL LOW (ref 3.5–5.3)
Sodium: 145 mEq/L (ref 135–145)
Total Bilirubin: 0.4 mg/dL (ref 0.3–1.2)
Total Protein: 6.8 g/dL (ref 6.0–8.3)

## 2011-07-05 NOTE — Assessment & Plan Note (Signed)
Plan to refill diabetes medications and check a hemoglobin A1c today.  Encouraged patient to check his blood sugar occasionally. He expresses understanding

## 2011-07-05 NOTE — Assessment & Plan Note (Signed)
Patient has fatigue.  I think this is likely sleep apnea however we'll start investigating simple possibilities such as hypothyroidism and anemia.  Check CBC and TSH today followup in 2-4 weeks. If not improved will do depression screen and then refer for sleep study

## 2011-07-05 NOTE — Assessment & Plan Note (Signed)
Chronic back pain.  Seems to do reasonably well. Plan to refill hydrocodone and follow patient up in 2-4 weeks

## 2011-07-05 NOTE — Progress Notes (Signed)
Tyler Lester is a 66 y.o. male who presents to [facility] today for  1) low back pain: Patient has chronic low back pain with occasional exacerbations. 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 He has had worsening back pain as result. He has run out of his Vicodin and would like a refill. He does the pain occurs in his lumbar back without radiation weakness or numbness.  He feels well otherwise, and denies any bowel or bladder dysfunction.  2) hypertension: Has been out of almost all of his blood pressure medications and has not been taking them. Denies any chest pain palpitations trouble breathing edema.    3) diabetes: Of his diabetes medications and has not been checking his blood sugar. Denies any polyuria polydipsia or symptomatic hypoglycemic episodes. Like medication refills today.  4) fatigue: Patient has one month of significant daytime fatigue. He feels sleepy during the day. He notes that he does snore at night, and sometimes feels tired when he gets up. Many years ago he had a sleep study that was negative however he has gained weight since then. Denies any suicidal ideation   PMH: Reviewed significant for hypertension diabetes and chronic back pain History  Substance Use Topics  . Smoking status: Never Smoker   . Smokeless tobacco: Not on file  . Alcohol Use: Not on file   ROS as above  Medications reviewed. Current Outpatient Prescriptions  Medication Sig Dispense Refill  . glimepiride (AMARYL) 4 MG tablet 1 pill po daily for diabetes  90 tablet  2  . metFORMIN (GLUCOPHAGE) 1000 MG tablet 1 pill po twice daily for diabetes  180 tablet  2  . amLODipine-benazepril (LOTREL) 10-20 MG per capsule 1 po daily for blood pressure  90 capsule  2  . clonazePAM (KLONOPIN) 0.5 MG tablet Take 0.5 mg by mouth 2 (two) times daily as needed.        . hydrochlorothiazide (HYDRODIURIL) 25 MG tablet 1 pill po daily for blood pressure  90 tablet  2  .  HYDROcodone-acetaminophen (VICODIN ES) 7.5-750 MG per tablet Take 1 tablet by mouth daily as needed for pain.  30 tablet  1  . ibuprofen (ADVIL,MOTRIN) 800 MG tablet Take 800 mg by mouth every 8 (eight) hours as needed.        Marland Kitchen ketoconazole (NIZORAL) 2 % cream Apply topically daily.  75 g  12  . metoprolol succinate (TOPROL XL) 50 MG 24 hr tablet 1 pill po daily for blood pressure  90 tablet  2  . pantoprazole (PROTONIX) 40 MG tablet Take 1 tablet (40 mg total) by mouth daily.  30 tablet  12  . sildenafil (VIAGRA) 100 MG tablet Take 0.5-1 tablets (50-100 mg total) by mouth daily as needed for erectile dysfunction.  5 tablet  11  . traMADol (ULTRAM) 50 MG tablet Take 1 tablet (50 mg total) by mouth every 8 (eight) hours as needed. Maximum dose= 8 tablets per day  60 tablet  1    Exam:  BP 185/98  Pulse 82  Ht 5\' 8"  (1.727 m)  Wt 223 lb (101.152 kg)  BMI 33.91 kg/m2 Gen: Well NAD HEENT: EOMI,  MMM Lungs: CTABL Nl WOB Heart: RRR no MRG Abd: NABS, NT, ND Exts: Non edematous BL  LE, warm and well perfused.  Back: Nontender over spinal midline. Left SI joint tenderness. Normal gait normal strength normal reflexes. Sensations are intact. Patient can get onto and off exam table by  himself.  Results for orders placed in visit on 07/04/11 (from the past 72 hour(s))  POCT GLYCOSYLATED HEMOGLOBIN (HGB A1C)     Status: Normal   Collection Time   07/04/11  3:31 PM      Component Value Range Comment   Hemoglobin A1C 7.2     COMPLETE METABOLIC PANEL WITH GFR     Status: Abnormal   Collection Time   07/04/11  3:55 PM      Component Value Range Comment   Sodium 145  135 - 145 (mEq/L)    Potassium 3.4 (*) 3.5 - 5.3 (mEq/L)    Chloride 108  96 - 112 (mEq/L)    CO2 29  19 - 32 (mEq/L)    Glucose, Bld 155 (*) 70 - 99 (mg/dL)    BUN 13  6 - 23 (mg/dL)    Creat 1.61  0.96 - 1.35 (mg/dL)    Total Bilirubin 0.4  0.3 - 1.2 (mg/dL)    Alkaline Phosphatase 51  39 - 117 (U/L)    AST 20  0 - 37 (U/L)    ALT  17  0 - 53 (U/L)    Total Protein 6.8  6.0 - 8.3 (g/dL)    Albumin 3.9  3.5 - 5.2 (g/dL)    Calcium 8.9  8.4 - 10.5 (mg/dL)    GFR, Est African American >89      GFR, Est Non African American 87     LDL CHOLESTEROL, DIRECT     Status: Normal   Collection Time   07/04/11  3:55 PM      Component Value Range Comment   Direct LDL 85     TSH     Status: Normal   Collection Time   07/04/11  3:55 PM      Component Value Range Comment   TSH 1.172  0.350 - 4.500 (uIU/mL)

## 2011-07-05 NOTE — Assessment & Plan Note (Signed)
Blood pressure poorly controlled today due to medication in appearance. Plan to restart his blood pressure medications with what they're used for written on the bottle.  Followup in 2-4 weeks. Plan to check lipids and metabolic panel and direct LDL today

## 2011-08-31 ENCOUNTER — Other Ambulatory Visit: Payer: Self-pay | Admitting: *Deleted

## 2011-08-31 DIAGNOSIS — G8929 Other chronic pain: Secondary | ICD-10-CM

## 2011-09-03 ENCOUNTER — Encounter: Payer: Self-pay | Admitting: Family Medicine

## 2011-09-03 ENCOUNTER — Ambulatory Visit (INDEPENDENT_AMBULATORY_CARE_PROVIDER_SITE_OTHER): Payer: Medicare Other | Admitting: Family Medicine

## 2011-09-03 VITALS — BP 163/90 | HR 76 | Ht 68.0 in | Wt 206.6 lb

## 2011-09-03 DIAGNOSIS — G8929 Other chronic pain: Secondary | ICD-10-CM

## 2011-09-03 DIAGNOSIS — I10 Essential (primary) hypertension: Secondary | ICD-10-CM

## 2011-09-03 DIAGNOSIS — E1149 Type 2 diabetes mellitus with other diabetic neurological complication: Secondary | ICD-10-CM

## 2011-09-03 DIAGNOSIS — R5383 Other fatigue: Secondary | ICD-10-CM

## 2011-09-03 DIAGNOSIS — R5381 Other malaise: Secondary | ICD-10-CM

## 2011-09-03 DIAGNOSIS — E1142 Type 2 diabetes mellitus with diabetic polyneuropathy: Secondary | ICD-10-CM

## 2011-09-03 MED ORDER — HYDROCODONE-ACETAMINOPHEN 7.5-750 MG PO TABS: 1.0000 | ORAL_TABLET | Freq: Every day | ORAL | Status: DC | PRN

## 2011-09-03 MED ORDER — LISINOPRIL 10 MG PO TABS: 10.0000 mg | ORAL_TABLET | Freq: Every day | ORAL | Status: DC

## 2011-09-03 NOTE — Assessment & Plan Note (Signed)
Stable. Refilled vicodin 7.5/750 daily prn. Continue tramadol and ibuprofen with moderation.

## 2011-09-03 NOTE — Progress Notes (Signed)
Patient ID: Tyler Lester, male   DOB: 08-09-1945, 66 y.o.   MRN: [id number] Patient ID: Tyler Lester    DOB: April 28, 1945, 66 y.o.   MRN: [id number] --- Subjective:  Tyler Lester is a 66 y.o.male who presents for medication refill.   Hypertension:  Takes HCTZ 25mg  daily and metoprolol 50mg  daily. Doesn't check BP at home.  Denies headache, chest pain, lower extremity swelling, acute change in vision, shortness of breath.  BAck pain: right sided, chronic. Takes ibuprofen which doesn;t work as well as vicodin which he takes daily. Also takes tramadol.  Denies any radiation of pain to the legs. No weakness, no urinary or bowel incontinence. No numbness or tingling in lower extremities. No worsening of pain.  Also complaining of fatigue ROS: see HPI Past Medical History: reviewed and updated medications and allergies. Social History: Tobacco: denies  Objective: Filed Vitals:   09/03/11 1020  BP: 163/90  Pulse: 76    Physical Examination:   General appearance - alert, well appearing, and in no distress Nose - normal and patent, no erythema, discharge or polyps Mouth - mucous membranes moist, pharynx normal without lesions Neck - supple, no significant adenopathy Chest - clear to auscultation, no wheezes, rales or rhonchi, symmetric air entry Heart - normal rate, regular rhythm, normal S1, S2, no murmurs, rubs, clicks or gallops Abdomen - soft, nontender, nondistended, no masses or organomegaly Extremities - peripheral pulses normal, no pedal edema, no clubbing or cyanosis Back - tenderness to palpation along right paralumbar muscles. Negative straight leg. Normal range of motion of knees and hips. 5/5 strength lower extremities bilaterally.

## 2011-09-03 NOTE — Assessment & Plan Note (Signed)
Not well controled. Was not taking amlodipine-benazepril. Started patient on lisinopril 10mg . Will check BMP in 2 weeks. Follow up in 1 month. See avs for full HTN management.

## 2011-09-03 NOTE — Assessment & Plan Note (Signed)
TSH from last visit was normal. Continues to report fatigue. Will obtain CBC to make sure he is not anemic. CMP in May was within normal limits. Will follow up at next visit.

## 2011-09-03 NOTE — Assessment & Plan Note (Signed)
A1C of 7.2 in May. Repeat in August. Continue metformin 1000mg  bid and glimepiride 4mg  daily. Denies any symptoms of hypoglycemia.

## 2011-09-03 NOTE — Patient Instructions (Addendum)
It was nice to meet you today!  Your blood pressure was high today. I am starting you on a blood pressure medication called lisinopril 10mg  to take once a day. The 3 blood pressure medicines you need to take are: Lisinopril 10mg  daily, hydrochlorothiazide 25mg  daily and metoprolol 50mg  daily.   I would like to get some blood work in 2-3 weeks. Please make an appointment for a lab test in 2 weeks to make sure that the new medication is not affecting your blood tests.   I will see you again in 1 month to see how the blood pressure medicine is working. If you can, when you are at a drug store, check your blood pressure at an automatic blood pressure machine and write it down so that I can see what your blood pressure has been like.

## 2011-09-21 ENCOUNTER — Telehealth: Payer: Self-pay | Admitting: Family Medicine

## 2011-09-21 DIAGNOSIS — R634 Abnormal weight loss: Secondary | ICD-10-CM

## 2011-09-21 NOTE — Telephone Encounter (Signed)
Open in error

## 2011-09-21 NOTE — Telephone Encounter (Signed)
Noticed that patient had lost 15lbs since last visit in June. Wanted to touch base about this and encourage him to get some blood work done and make follow up appointment with me. Left message on answering message reminding him of lab work and importance of follow up appointment. Will obtain CBC with diff and CMP

## 2011-10-02 ENCOUNTER — Ambulatory Visit (INDEPENDENT_AMBULATORY_CARE_PROVIDER_SITE_OTHER): Payer: PRIVATE HEALTH INSURANCE | Admitting: Family Medicine

## 2011-10-02 ENCOUNTER — Encounter: Payer: Self-pay | Admitting: Family Medicine

## 2011-10-02 ENCOUNTER — Telehealth: Payer: Self-pay | Admitting: Family Medicine

## 2011-10-02 VITALS — BP 133/70 | HR 74 | Temp 97.7°F | Ht 68.0 in | Wt 199.2 lb

## 2011-10-02 DIAGNOSIS — K5289 Other specified noninfective gastroenteritis and colitis: Secondary | ICD-10-CM

## 2011-10-02 DIAGNOSIS — K529 Noninfective gastroenteritis and colitis, unspecified: Secondary | ICD-10-CM

## 2011-10-02 NOTE — Patient Instructions (Signed)
You have a viral infection that is causing your loose stools.  It may go on for up to another week. You can try taking some fiber supplements such as Metamucil to help with the loose stools.  You can get this at the drug store.  Take in 1-2 times per day for the next week. You will need to make an appointment with your primary physician, Dr. Gwenlyn Saran, to discuss refills on your pain medications.

## 2011-10-02 NOTE — Telephone Encounter (Signed)
Patient was seen today by Ritch as a work in and on the way out he told me that he is out of his pain meds and that the drug store had sent [place] a request for  refills.  I tried to make him an appointment with you but you don't have any available times until the end of the month.  What would you like to do?  His pharmacy is [facility].

## 2011-10-02 NOTE — Progress Notes (Signed)
Patient ID: Windel Keziah, male   DOB: 1945/06/04, 66 y.o.   MRN: [id number] Subjective: The patient is a 66 y.o. year old male who presents today for diarrhea x1 week.  1. Diarrhea: x1 week.  Started with nausea and vomiting.  No blood.  Stools are somewhat watery and occurring 3-4 times per day.  No fevers/chills.  No rhinorrhea or cough.  No abd pain or cramps.  Patient's past medical, social, and family history were reviewed and updated as appropriate. History  Substance Use Topics  . Smoking status: Never Smoker   . Smokeless tobacco: Not on file  . Alcohol Use: Not on file   Objective:  Filed Vitals:   10/02/11 1453  BP: 133/70  Pulse: 74  Temp: 97.7 F (36.5 C)   Gen: NAD, overweight CV: RRR Resp: CTABL Abd: SNTND, +BS Ext: No edema, <2 sec cap refill  Assessment/Plan: Viral gastro, resolving.  With normal bp and pulse I doubt significant dehydration.  Will recommend fiber to help form stools and RTC in 1-2 weeks if not better.  Please also see individual problems in problem list for problem-specific plans.

## 2011-10-03 ENCOUNTER — Other Ambulatory Visit: Payer: Self-pay | Admitting: Family Medicine

## 2011-10-03 ENCOUNTER — Telehealth: Payer: Self-pay | Admitting: *Deleted

## 2011-10-03 NOTE — Telephone Encounter (Signed)
Called in to [facility], vicodin 7.5/750 qd/prn pain since patient was unable to obtain appointment this month. Patient will need to be seen before getting new Rx for vicodin.

## 2011-10-03 NOTE — Telephone Encounter (Signed)
Called and informed pt of appt with [facility] on 10/09/2011 @ 1000 AM [name]  [phone]  Expressed to pt that if he CANNOT keep this appt that he will need to call their office 24 hours in advance to CANCEL/RESCHEDULE or he may be charged a fee. Also told pt to bring in his insurance card, ID, copay and new patient information packet. Pt voiced understanding and agreed.Loralee Pacas [place]

## 2011-10-22 ENCOUNTER — Ambulatory Visit (INDEPENDENT_AMBULATORY_CARE_PROVIDER_SITE_OTHER): Payer: PRIVATE HEALTH INSURANCE | Admitting: Family Medicine

## 2011-10-22 ENCOUNTER — Encounter: Payer: Self-pay | Admitting: Family Medicine

## 2011-10-22 VITALS — BP 121/76 | HR 74 | Temp 98.4°F | Ht 68.0 in | Wt 201.0 lb

## 2011-10-22 DIAGNOSIS — H00039 Abscess of eyelid unspecified eye, unspecified eyelid: Secondary | ICD-10-CM

## 2011-10-22 DIAGNOSIS — L03213 Periorbital cellulitis: Secondary | ICD-10-CM

## 2011-10-22 DIAGNOSIS — R197 Diarrhea, unspecified: Secondary | ICD-10-CM

## 2011-10-22 MED ORDER — PROBIOTIC PO CAPS: 1.0000 | ORAL_CAPSULE | Freq: Every day | ORAL | Status: DC

## 2011-10-22 MED ORDER — LOPERAMIDE HCL 2 MG PO TABS: 2.0000 mg | ORAL_TABLET | Freq: Four times a day (QID) | ORAL | Status: AC | PRN

## 2011-10-22 MED ORDER — SULFAMETHOXAZOLE-TRIMETHOPRIM 800-160 MG PO TABS: 2.0000 | ORAL_TABLET | Freq: Two times a day (BID) | ORAL | Status: AC

## 2011-10-22 NOTE — Patient Instructions (Addendum)
For eye swelling: -Take antibiotic: 2 tablets every 12 hours for 7 days   -Take probiotic: 1 tablet daily while taking antibiotic to prevent diarrhea -Ice 20 minutes at a time to help with swelling -Take Tylenol 650 mg every 8 hours as needed  For diarrhea: -Try Imodium: 1 tablet every 6 hours as needed  Follow-up on [facility] (with Dr. Gwenlyn Saran if available)

## 2011-10-23 DIAGNOSIS — L03213 Periorbital cellulitis: Secondary | ICD-10-CM | POA: Insufficient documentation

## 2011-10-23 DIAGNOSIS — R197 Diarrhea, unspecified: Secondary | ICD-10-CM | POA: Insufficient documentation

## 2011-10-23 NOTE — Assessment & Plan Note (Signed)
Without abdominal pain or systemic signs of infection. Try Lomotil. Follow-up as needed. Discussed how may worsen with patient's taking antibiotic for cellulitis.

## 2011-10-23 NOTE — Progress Notes (Signed)
  Subjective:    Patient ID: Tyler Lester, male    DOB: 10-14-1945, 66 y.o.   MRN: [id number]  HPI # Right eye swelling for the past 4 days Denies bug bites or trauma to that or surrounding area His eye hurts but the pain is not particularly worse when he moves his eyeball He denies vision changes ROS: denies fevers/chills/nausea  # Diarrhea for the past 4 days He denies abdominal pain or recent antibiotic use He is having about 4 loose stools daily. He denies blood in stool His appetite is unchanged  Review of Systems Per HPI  Allergies, medication, past medical history reviewed.  Significant for -T2DM with neuropathy--HgbA1c 7.2 (06/2011) -HTN -GERD    Objective:   Physical Exam GEN: NAD HEENT: significant right upper and lower eye lid swelling; EOMI; no conjunctival erythema or discharge; PERRL; vision grossly intact; no hyperemia  NECK: tender along right submandible but no LN palpable  PULM: NI WOB ABD: NABS, soft, NT, ND SKIN: no other rash    Assessment & Plan:

## 2011-10-23 NOTE — Assessment & Plan Note (Addendum)
Right eye, etiology unclear Will treat with Bactrim and follow-up in 2 days. Take with probiotic especially with current diarrhea.   Given indications to RTC or go to the ED

## 2011-10-24 ENCOUNTER — Ambulatory Visit (INDEPENDENT_AMBULATORY_CARE_PROVIDER_SITE_OTHER): Payer: PRIVATE HEALTH INSURANCE | Admitting: Family Medicine

## 2011-10-24 ENCOUNTER — Encounter: Payer: Self-pay | Admitting: Family Medicine

## 2011-10-24 ENCOUNTER — Ambulatory Visit: Payer: PRIVATE HEALTH INSURANCE | Admitting: Family Medicine

## 2011-10-24 VITALS — BP 109/68 | HR 73 | Temp 98.3°F | Ht 68.0 in | Wt 200.0 lb

## 2011-10-24 DIAGNOSIS — Z79899 Other long term (current) drug therapy: Secondary | ICD-10-CM

## 2011-10-24 DIAGNOSIS — E1149 Type 2 diabetes mellitus with other diabetic neurological complication: Secondary | ICD-10-CM

## 2011-10-24 DIAGNOSIS — R197 Diarrhea, unspecified: Secondary | ICD-10-CM

## 2011-10-24 DIAGNOSIS — E1142 Type 2 diabetes mellitus with diabetic polyneuropathy: Secondary | ICD-10-CM

## 2011-10-24 DIAGNOSIS — H00039 Abscess of eyelid unspecified eye, unspecified eyelid: Secondary | ICD-10-CM

## 2011-10-24 DIAGNOSIS — L03213 Periorbital cellulitis: Secondary | ICD-10-CM

## 2011-10-24 LAB — POCT GLYCOSYLATED HEMOGLOBIN (HGB A1C): Hemoglobin A1C: 6.5

## 2011-10-24 NOTE — Patient Instructions (Addendum)
Finish all of your antibiotic  Keep your follow-up appointment with Dr. Gwenlyn Saran

## 2011-10-24 NOTE — Assessment & Plan Note (Signed)
Resolved

## 2011-10-24 NOTE — Assessment & Plan Note (Addendum)
Resolved with antibiotics. Advised to finish course.  His home health nurse reports that he rubs his eyes constantly. This may be impetus for his infection.  He has follow-up with his PCP already arranged for next week for routine follow-up.

## 2011-10-24 NOTE — Progress Notes (Signed)
  Subjective:    Patient ID: Tyler Lester, male    DOB: 30-Jul-1945, 66 y.o.   MRN: [id number]  HPI # Right preseptal cellulitis Improving since 2 days ago He is taking antibiotic He denies eye pain or vision changes  His home health nurse reports that he constantly rubs his eyes  ROS: denies fevers/chills  # Diarrhea Imodium helped He is no longer having diarrhea, and his last bowel movement was yesterday  Review of Systems Per HPI    Objective:   Physical Exam GEN: NAD HEENT: right eye swelling resolved; normal conjunctiva without injection or discharge    Assessment & Plan:

## 2011-11-01 ENCOUNTER — Ambulatory Visit (INDEPENDENT_AMBULATORY_CARE_PROVIDER_SITE_OTHER): Payer: PRIVATE HEALTH INSURANCE | Admitting: Family Medicine

## 2011-11-01 ENCOUNTER — Encounter: Payer: Self-pay | Admitting: Family Medicine

## 2011-11-01 VITALS — BP 135/84 | HR 76 | Ht 68.0 in | Wt 198.0 lb

## 2011-11-01 DIAGNOSIS — R5381 Other malaise: Secondary | ICD-10-CM

## 2011-11-01 DIAGNOSIS — H00039 Abscess of eyelid unspecified eye, unspecified eyelid: Secondary | ICD-10-CM

## 2011-11-01 DIAGNOSIS — R5383 Other fatigue: Secondary | ICD-10-CM

## 2011-11-01 DIAGNOSIS — E1149 Type 2 diabetes mellitus with other diabetic neurological complication: Secondary | ICD-10-CM

## 2011-11-01 DIAGNOSIS — L03213 Periorbital cellulitis: Secondary | ICD-10-CM

## 2011-11-01 DIAGNOSIS — G8929 Other chronic pain: Secondary | ICD-10-CM

## 2011-11-01 DIAGNOSIS — R634 Abnormal weight loss: Secondary | ICD-10-CM

## 2011-11-01 DIAGNOSIS — B353 Tinea pedis: Secondary | ICD-10-CM

## 2011-11-01 DIAGNOSIS — I10 Essential (primary) hypertension: Secondary | ICD-10-CM

## 2011-11-01 DIAGNOSIS — E1142 Type 2 diabetes mellitus with diabetic polyneuropathy: Secondary | ICD-10-CM

## 2011-11-01 LAB — COMPLETE METABOLIC PANEL WITH GFR
ALT: 10 U/L (ref 0–53)
AST: 13 U/L (ref 0–37)
Albumin: 3.9 g/dL (ref 3.5–5.2)
Alkaline Phosphatase: 53 U/L (ref 39–117)
BUN: 13 mg/dL (ref 6–23)
CO2: 26 mEq/L (ref 19–32)
Calcium: 9.4 mg/dL (ref 8.4–10.5)
Chloride: 102 mEq/L (ref 96–112)
Creat: 1.26 mg/dL (ref 0.50–1.35)
GFR, Est African American: 69 mL/min
GFR, Est Non African American: 60 mL/min
Glucose, Bld: 96 mg/dL (ref 70–99)
Potassium: 3.2 mEq/L — ABNORMAL LOW (ref 3.5–5.3)
Sodium: 138 mEq/L (ref 135–145)
Total Bilirubin: 0.6 mg/dL (ref 0.3–1.2)
Total Protein: 7.1 g/dL (ref 6.0–8.3)

## 2011-11-01 LAB — CBC WITH DIFFERENTIAL/PLATELET
Basophils Absolute: 0 10*3/uL (ref 0.0–0.1)
Basophils Relative: 0 % (ref 0–1)
Eosinophils Absolute: 0.1 10*3/uL (ref 0.0–0.7)
Eosinophils Relative: 1 % (ref 0–5)
HCT: 41.7 % (ref 39.0–52.0)
Hemoglobin: 14.8 g/dL (ref 13.0–17.0)
Lymphocytes Relative: 33 % (ref 12–46)
Lymphs Abs: 2.2 10*3/uL (ref 0.7–4.0)
MCH: 29.4 pg (ref 26.0–34.0)
MCHC: 35.5 g/dL (ref 30.0–36.0)
MCV: 82.9 fL (ref 78.0–100.0)
Monocytes Absolute: 0.5 10*3/uL (ref 0.1–1.0)
Monocytes Relative: 7 % (ref 3–12)
Neutro Abs: 4 10*3/uL (ref 1.7–7.7)
Neutrophils Relative %: 59 % (ref 43–77)
Platelets: 251 10*3/uL (ref 150–400)
RBC: 5.03 MIL/uL (ref 4.22–5.81)
RDW: 13.6 % (ref 11.5–15.5)
WBC: 6.8 10*3/uL (ref 4.0–10.5)

## 2011-11-01 LAB — TSH: TSH: 1.665 u[IU]/mL (ref 0.350–4.500)

## 2011-11-01 MED ORDER — HYDROCHLOROTHIAZIDE 25 MG PO TABS: ORAL_TABLET | ORAL | Status: DC

## 2011-11-01 MED ORDER — KETOCONAZOLE 2 % EX CREA: TOPICAL_CREAM | Freq: Every day | CUTANEOUS | Status: DC

## 2011-11-01 MED ORDER — IBUPROFEN 800 MG PO TABS: 800.0000 mg | ORAL_TABLET | Freq: Three times a day (TID) | ORAL | Status: DC | PRN

## 2011-11-01 MED ORDER — HYDROCODONE-ACETAMINOPHEN 7.5-750 MG PO TABS: 1.0000 | ORAL_TABLET | Freq: Every day | ORAL | Status: DC | PRN

## 2011-11-01 NOTE — Assessment & Plan Note (Signed)
A!C: 6.5 on 8/28: continue metformin 1000mg  bid and glimepiride 4mg   daily

## 2011-11-01 NOTE — Assessment & Plan Note (Signed)
Improving. Patient to finish bactrim course.

## 2011-11-01 NOTE — Assessment & Plan Note (Signed)
Unclear etiology at this time. Associated with weight loss which could be from decreased appetite. Will recheck TSH, CMP and CBC. Colorectal cancer always a thought with weight loss, but Patient up to date for colorectal screening. Will follow up in1 month.

## 2011-11-01 NOTE — Assessment & Plan Note (Signed)
Refilled vicodin 7/5/750 and tramadol. Appears stable. No neuro findings.

## 2011-11-01 NOTE — Patient Instructions (Addendum)
You can Try taking a multivitamin that you can get over the counter at the pharmacy. We will be getting some blood work today to see if there's anything that could explain your lack of energy and weight loss.   I would like to see you back in 1 month

## 2011-11-01 NOTE — Progress Notes (Signed)
Patient ID: Tyler Lester, male   DOB: 08/28/1945, 66 y.o.   MRN: [id number] Patient ID: Tyler Lester    DOB: August 08, 1945, 66 y.o.   MRN: [id number] --- Subjective:  Tyler Lester is a 66 y.o.male who presents for follow up. - HTN: has been taking his blood pressure medications and tolerating them well. He denies any chest pain, shortness of breath, PND, orthopnea. - preseptal cellulitis diagnosed last week. Feels much better, feels that swelling has gone down. Taking bactrim. No fevers or chills. No change in vision.  - Fatigue and weight loss: feels tired. States that his muscles feel tired when he walks. He also has had lower appetite and not eating as much as he used to. He would like something for energy. Denies any blood in stool. Per patient, last colonoscopy was 2-3 years ago and was normal.    ROS: see HPI Past Medical History: reviewed and updated medications and allergies. Social History: Tobacco: denies  Objective: Filed Vitals:   11/01/11 1042  BP: 135/84  Pulse: 76    Physical Examination:   General appearance - alert, well appearing, and in no distress, wearing sunglasses Eyes: right eye: minimal swelling around orbit compared to left.  Mouth - mucous membranes moist, pharynx normal without lesions Chest - clear to auscultation, no wheezes, rales or rhonchi, symmetric air entry Heart - normal rate, regular rhythm, normal S1, S2, no murmurs, rubs, clicks or gallops Abdomen - soft, nontender, nondistended, no masses or organomegaly Extremities - peripheral pulses normal, no pedal edema, no clubbing or cyanosis MSK - tenderness along cervical spine and along lumbar spine. 4+/5 knee extension and flexion bilaterally, 4+/5 hip flexion and extension, 4+/5 plantarflexion and dorsiflexion b/l. Sensation to light touch intact

## 2011-11-01 NOTE — Assessment & Plan Note (Signed)
stable on lisninopril 10mg  and HCTZ 25mg  at 135/84. Reviewed previous BP's which have been well controled. Will continue with current regimen and obtain BMP since lisinopril was started a couple of months ago and follow up lab work had not been obtained.

## 2011-11-07 ENCOUNTER — Ambulatory Visit: Payer: PRIVATE HEALTH INSURANCE | Admitting: Family Medicine

## 2011-11-07 ENCOUNTER — Telehealth: Payer: Self-pay | Admitting: Family Medicine

## 2011-11-07 DIAGNOSIS — E876 Hypokalemia: Secondary | ICD-10-CM

## 2011-11-07 NOTE — Telephone Encounter (Signed)
Called patient and left voice message stating that K was low. Instructed patient to return for lab draw in 10 days to follow up K. If low at the time as well, will consider supplementation.

## 2011-11-16 ENCOUNTER — Other Ambulatory Visit: Payer: PRIVATE HEALTH INSURANCE

## 2011-11-16 DIAGNOSIS — E876 Hypokalemia: Secondary | ICD-10-CM

## 2011-11-16 NOTE — Progress Notes (Signed)
BMP DONE TODAY Tyler Lester 

## 2011-11-17 LAB — BASIC METABOLIC PANEL
BUN: 11 mg/dL (ref 6–23)
CO2: 27 mEq/L (ref 19–32)
Calcium: 9.3 mg/dL (ref 8.4–10.5)
Chloride: 105 mEq/L (ref 96–112)
Creat: 1.22 mg/dL (ref 0.50–1.35)
Glucose, Bld: 122 mg/dL — ABNORMAL HIGH (ref 70–99)
Potassium: 3.8 mEq/L (ref 3.5–5.3)
Sodium: 142 mEq/L (ref 135–145)

## 2011-12-03 ENCOUNTER — Encounter: Payer: Self-pay | Admitting: Family Medicine

## 2011-12-03 ENCOUNTER — Ambulatory Visit (INDEPENDENT_AMBULATORY_CARE_PROVIDER_SITE_OTHER): Payer: PRIVATE HEALTH INSURANCE | Admitting: Family Medicine

## 2011-12-03 VITALS — BP 162/100 | HR 71 | Ht 69.0 in | Wt 200.0 lb

## 2011-12-03 DIAGNOSIS — R5383 Other fatigue: Secondary | ICD-10-CM

## 2011-12-03 DIAGNOSIS — R5381 Other malaise: Secondary | ICD-10-CM

## 2011-12-03 DIAGNOSIS — I10 Essential (primary) hypertension: Secondary | ICD-10-CM

## 2011-12-03 NOTE — Progress Notes (Signed)
  Subjective:    Patient ID: Tyler Lester, male    DOB: 03/02/1945, 66 y.o.   MRN: [id number]  HPI Work in appt for fatigue.  "Moving too slow, off balance, lack of energy"  For the past few weeks.  Also notes has been losing his balance for the past 4-5 years.  Fatigue:  Only sleeping 2-3 hours per night.  Goes to bed 1-2 AM, wakes up 2-3 hours later, feels rested when he wakes up, this is his normal pattern for 3-4 years.  Takes naps daily- several hours a day.    Feels "unbalanced, feels my knees popping"  Does not endorse room spinning.  Keeps repeating he is "just walking too slow, need something to help in pepping me up a little"  No actual falls.    Poor historian.  Review of medical chart reveals fatigue has been a recurrent complaint.  Has been worke dup most recently last month with normal CBC, CMET, TSH     Denies being on clonazepam and denies illicit substance use.  States takign vicodin from PCP, does not feel tramadol works well. "Review of Systems No numbness or tingling, No dyspnea, chest pain.   No dizziness, does not sound orthostatic or syncopal in nature,  Denies change in mood, sadness, decreased need for sleep. Objective:   Physical Exam GEN: Alert & Oriented, No acute distress CV:  Regular Rate & Rhythm, no murmur Respiratory:  Normal work of breathing, CTAB Abd:  + BS, soft, no tenderness to palpation Ext: no pre-tibial edema Neuro: gets onto exam table without difficulty.  Cn 2-12 intact.  Neg pronator drift.  Normal finger to nose.  Normal rhomberg.          Assessment & Plan:

## 2011-12-03 NOTE — Patient Instructions (Addendum)
Will order sleep study  Make follow-up with your primary doctor in 2 weeks

## 2011-12-04 NOTE — Assessment & Plan Note (Signed)
Patients complaints non specific, chronic over years.  Lack of sleep likely contributor.  Reports waking up after 2-3 hours, snoring, daytime somnolence requiring naps.  Will order sleep study, patient to return in 3-4 weeks for follow-up with PCP.

## 2011-12-04 NOTE — Assessment & Plan Note (Signed)
Patient did not take BP meds today.  Will recheck at follow-up with PCP in 3-4 weeks

## 2011-12-17 ENCOUNTER — Ambulatory Visit (INDEPENDENT_AMBULATORY_CARE_PROVIDER_SITE_OTHER): Payer: PRIVATE HEALTH INSURANCE | Admitting: Family Medicine

## 2011-12-17 ENCOUNTER — Encounter: Payer: Self-pay | Admitting: Family Medicine

## 2011-12-17 VITALS — BP 146/96 | HR 80 | Ht 68.0 in | Wt 199.0 lb

## 2011-12-17 DIAGNOSIS — M25559 Pain in unspecified hip: Secondary | ICD-10-CM

## 2011-12-17 DIAGNOSIS — H9190 Unspecified hearing loss, unspecified ear: Secondary | ICD-10-CM

## 2011-12-17 DIAGNOSIS — I10 Essential (primary) hypertension: Secondary | ICD-10-CM

## 2011-12-17 DIAGNOSIS — R5381 Other malaise: Secondary | ICD-10-CM

## 2011-12-17 DIAGNOSIS — R634 Abnormal weight loss: Secondary | ICD-10-CM

## 2011-12-17 DIAGNOSIS — E1149 Type 2 diabetes mellitus with other diabetic neurological complication: Secondary | ICD-10-CM

## 2011-12-17 DIAGNOSIS — E1142 Type 2 diabetes mellitus with diabetic polyneuropathy: Secondary | ICD-10-CM

## 2011-12-17 DIAGNOSIS — R5383 Other fatigue: Secondary | ICD-10-CM

## 2011-12-17 DIAGNOSIS — G8929 Other chronic pain: Secondary | ICD-10-CM

## 2011-12-17 DIAGNOSIS — M25551 Pain in right hip: Secondary | ICD-10-CM

## 2011-12-17 MED ORDER — CYCLOBENZAPRINE HCL 10 MG PO TABS: 10.0000 mg | ORAL_TABLET | Freq: Two times a day (BID) | ORAL | Status: DC | PRN

## 2011-12-17 MED ORDER — GLIMEPIRIDE 4 MG PO TABS: ORAL_TABLET | ORAL | Status: DC

## 2011-12-17 MED ORDER — HYDROCHLOROTHIAZIDE 25 MG PO TABS: ORAL_TABLET | ORAL | Status: DC

## 2011-12-17 MED ORDER — HYDROCODONE-ACETAMINOPHEN 7.5-750 MG PO TABS: 1.0000 | ORAL_TABLET | Freq: Every day | ORAL | Status: DC | PRN

## 2011-12-17 MED ORDER — LISINOPRIL 10 MG PO TABS: 10.0000 mg | ORAL_TABLET | Freq: Every day | ORAL | Status: DC

## 2011-12-17 NOTE — Patient Instructions (Addendum)
For the hearing loss, I am going to refer you to a specialist that evaluates your hearing called an audiologist.   For the hip pain, you take ibuprofen 800mg  twice a day as needed for the pain. Continue taking the vicodin and the tramadol. I am prescribing a muscle relaxer to help with the miscles.   Come in whenever you'd like for the extra lab work.  I am going to check a chest xray as well.

## 2011-12-18 DIAGNOSIS — H9190 Unspecified hearing loss, unspecified ear: Secondary | ICD-10-CM | POA: Insufficient documentation

## 2011-12-18 DIAGNOSIS — M25551 Pain in right hip: Secondary | ICD-10-CM | POA: Insufficient documentation

## 2011-12-18 NOTE — Progress Notes (Signed)
Patient ID: [name]    DOB: 05/03/1945, 66 y.o.   MRN: [id number] --- Subjective:  Zaccai is a 66 y.o.male who presents for follow up. Concerns include the following: - right hip pain: has been chronic for a few years but has gotten worst in the last couple weeks where he feels weaker in the right leg because of pain. He feels like his balance is off when he tries to turn.  He denies any tingling or loss of sensation in leg. Denies any bowel or urine incontinence.   - right sided hearing loss: x1 year, worst in last week with feeling of water in ear. He denies any dizziness with walking and standing, no light headedness.  - fatigue: feels low energy. Has a sleep apnea study scheduled this week. He is told that he snores and he reports falling asleep easily during the day if he just sits for a few minutes. He goes to sleep around 12 or 1am and wakes up around 5am with interrupted sleep in between. He reports being more "moody" and feeling like his temper is short.  - weight loss: feels like he has been eating the same, not trying to loose weight.   ROS: see HPI Past Medical History: reviewed and updated medications and allergies. Social History: Tobacco: denies   Objective: Filed Vitals:   12/17/11 1437  BP: 146/96  Pulse: 80    Physical Examination:   General appearance - alert, well appearing, and in no distress Ears - scarred appearing right TM with erythema around canal, no tenderness with movement of the pinna, left TM unremarkable.  Nose - normal and patent, mildly edematous and erythematous nasal turbinates Mouth - mucous membranes moist, pharynx normal without lesions Chest - clear to auscultation, no wheezes, rales or rhonchi, symmetric air entry Heart - normal rate, regular rhythm, normal S1, S2, no murmurs, rubs, clicks or gallops Abdomen - soft, nontender, nondistended, no masses or organomegaly Extremities - peripheral pulses normal, no pedal edema Hip - tenderness to  palpation along right sacroiliac joint, normal range of movement of hip. Neuro - 4+/5 strength with knee flexion, extension and hip flexion bilaterally, CN2-12 grossly intact, normal finger to nose, negative rhomberg

## 2011-12-18 NOTE — Assessment & Plan Note (Signed)
Possibly from sleep apnea, which is soon to be furher evaluated. With his recent weight loss, occult malignancy is also part of the differential. He reports having a colonoscopy 1 year ago with removal of polyp and need to repeat test in 3-5 years. He is not a smoker, but will nonetheless check a cxr to rule out any obvious lung mass. Will also check a PSA and an HIV. CBC, TSH and CMP recently have been normal. Will closely follow up on this.
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
## 2011-12-18 NOTE — Assessment & Plan Note (Signed)
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 There is no evidence of infection on exam, but there does appear to be some scarring of the right TM. Will refer to audiology for further evaluation. His loss of balance doesn't seem to be associated with an inner ear issue, but more with a mechanical element regarding his right hip. Cranial nerve exam and overall neuro exam are unremarkable which is reassuring. Will follow up audiology recs

## 2011-12-27 ENCOUNTER — Encounter (HOSPITAL_BASED_OUTPATIENT_CLINIC_OR_DEPARTMENT_OTHER): Payer: PRIVATE HEALTH INSURANCE

## 2012-01-09 ENCOUNTER — Other Ambulatory Visit: Payer: Self-pay | Admitting: *Deleted

## 2012-01-09 MED ORDER — IBUPROFEN 800 MG PO TABS: 800.0000 mg | ORAL_TABLET | Freq: Three times a day (TID) | ORAL | Status: DC | PRN

## 2012-01-24 ENCOUNTER — Emergency Department (HOSPITAL_COMMUNITY)
Admission: EM | Admit: 2012-01-24 | Discharge: 2012-01-24 | Disposition: A | Discharge status: Home / Self Care | Payer: PRIVATE HEALTH INSURANCE | Attending: Emergency Medicine | Admitting: Emergency Medicine

## 2012-01-24 ENCOUNTER — Encounter (HOSPITAL_COMMUNITY): Payer: Self-pay | Admitting: Emergency Medicine

## 2012-01-24 DIAGNOSIS — E119 Type 2 diabetes mellitus without complications: Secondary | ICD-10-CM | POA: Insufficient documentation

## 2012-01-24 DIAGNOSIS — R339 Retention of urine, unspecified: Secondary | ICD-10-CM

## 2012-01-24 DIAGNOSIS — Z79899 Other long term (current) drug therapy: Secondary | ICD-10-CM | POA: Insufficient documentation

## 2012-01-24 DIAGNOSIS — E785 Hyperlipidemia, unspecified: Secondary | ICD-10-CM | POA: Insufficient documentation

## 2012-01-24 DIAGNOSIS — R338 Other retention of urine: Secondary | ICD-10-CM | POA: Insufficient documentation

## 2012-01-24 DIAGNOSIS — I10 Essential (primary) hypertension: Secondary | ICD-10-CM | POA: Insufficient documentation

## 2012-01-24 DIAGNOSIS — G4730 Sleep apnea, unspecified: Secondary | ICD-10-CM | POA: Insufficient documentation

## 2012-01-24 DIAGNOSIS — G8929 Other chronic pain: Secondary | ICD-10-CM | POA: Insufficient documentation

## 2012-01-24 LAB — URINALYSIS, ROUTINE W REFLEX MICROSCOPIC
Bilirubin Urine: NEGATIVE
Glucose, UA: NEGATIVE mg/dL
Ketones, ur: NEGATIVE mg/dL
Leukocytes, UA: NEGATIVE
Nitrite: NEGATIVE
Protein, ur: 100 mg/dL — AB
Specific Gravity, Urine: 1.012 (ref 1.005–1.030)
Urobilinogen, UA: 1 mg/dL (ref 0.0–1.0)
pH: 5.5 (ref 5.0–8.0)

## 2012-01-24 LAB — URINE MICROSCOPIC-ADD ON

## 2012-01-24 NOTE — ED Notes (Signed)
Foley cath inserted using sterile technique. Clear urine returned. Pt tolerated well 

## 2012-01-24 NOTE — ED Notes (Signed)
Pt sts straining to urinate today and only getting small amount out; pt sts some urge to go; pt sts hx of same in past

## 2012-01-24 NOTE — ED Notes (Signed)
Assumed care of patient. Pt resting comfortably. Denies any pain. VSS. Pt placed on monitor.

## 2012-01-24 NOTE — ED Provider Notes (Signed)
History     CSN: [id number]  Arrival date & time 01/24/12  1300   First MD Initiated Contact with Patient 01/24/12 1311      Chief Complaint  Patient presents with  . Urinary Retention    (Consider location/radiation/quality/duration/timing/severity/associated sxs/prior treatment) The history is provided by the patient.   patient reports difficulty urinating today and urinary retention.  He denies flank pain.  No nausea or vomiting.  He denies dysuria.  No fevers or chills.  He has a history of urinary incontinence and has required Foley catheter placement before.  Foley catheter was placed on arrival to emergency department the patient feels much better at this time.  He has no complaints.  His symptoms are moderate in severity at their worst.  Past Medical History  Diagnosis Date  . Diabetes mellitus   . Chronic pain   . Hypertension   . Hyperlipidemia   . Sleep apnea     Past Surgical History  Procedure Date  . Colon surgery     History reviewed. No pertinent family history.  History  Substance Use Topics  . Smoking status: Never Smoker   . Smokeless tobacco: Not on file  . Alcohol Use: Not on file      Review of Systems  All other systems reviewed and are negative.    Allergies  Review of patient's allergies indicates no known allergies.  Home Medications   Current Outpatient Rx  Name  Route  Sig  Dispense  Refill  . CYCLOBENZAPRINE HCL 10 MG PO TABS   Oral   Take 1 tablet (10 mg total) by mouth 2 (two) times daily as needed for muscle spasms.   60 tablet   0   . GLIMEPIRIDE 4 MG PO TABS      1 pill po daily for diabetes   90 tablet   2   . HYDROCHLOROTHIAZIDE 25 MG PO TABS      1 pill po daily for blood pressure   90 tablet   2   . HYDROCODONE-ACETAMINOPHEN 7.5-750 MG PO TABS   Oral   Take 1 tablet by mouth daily as needed for pain.   45 tablet   1   . IBUPROFEN 800 MG PO TABS   Oral   Take 1 tablet (800 mg total) by mouth every 8  (eight) hours as needed.   30 tablet   1   . KETOCONAZOLE 2 % EX CREA   Topical   Apply topically daily.   75 g   12   . LISINOPRIL 10 MG PO TABS   Oral   Take 1 tablet (10 mg total) by mouth daily.   90 tablet   3   . METFORMIN HCL 1000 MG PO TABS      1 pill po twice daily for diabetes   180 tablet   2   . METOPROLOL SUCCINATE ER 50 MG PO TB24      1 pill po daily for blood pressure   90 tablet   2   . PANTOPRAZOLE SODIUM 40 MG PO TBEC   Oral   Take 1 tablet (40 mg total) by mouth daily.   30 tablet   12   . PROBIOTIC PO CAPS   Oral   Take 1 capsule by mouth daily.   30 capsule   12   . TRAMADOL HCL 50 MG PO TABS   Oral   Take 1 tablet (50 mg total) by mouth every 8 (  eight) hours as needed. Maximum dose= 8 tablets per day   60 tablet   1   . CLONAZEPAM 0.5 MG PO TABS   Oral   Take 0.5 mg by mouth 2 (two) times daily as needed. For anxiety           BP 174/97  Pulse 98  Temp 98 F (36.7 C) (Oral)  Resp 20  SpO2 96%  Physical Exam  Nursing note and vitals reviewed. Constitutional: He is oriented to person, place, and time. He appears well-developed and well-nourished.  HENT:  Head: Normocephalic and atraumatic.  Eyes: EOM are normal.  Neck: Normal range of motion.  Cardiovascular: Normal rate, regular rhythm, normal heart sounds and intact distal pulses.   Pulmonary/Chest: Effort normal and breath sounds normal. No respiratory distress.  Abdominal: Soft. He exhibits no distension. There is no tenderness.  Musculoskeletal: Normal range of motion.  Neurological: He is alert and oriented to person, place, and time.  Skin: Skin is warm and dry.  Psychiatric: He has a normal mood and affect. Judgment normal.    ED Course  Procedures (including critical care time)  Labs Reviewed  URINALYSIS, ROUTINE W REFLEX MICROSCOPIC - Abnormal; Notable for the following:    APPearance HAZY (*)     Hgb urine dipstick MODERATE (*)     Protein, ur 100 (*)      All other components within normal limits  URINE MICROSCOPIC-ADD ON  URINE CULTURE   No results found.   1. Urinary retention       MDM  Patient with evidence of urinary retention.  Foley catheter with resolution of symptoms.  Abdomen benign.  Vital signs normal.  Discharge home with catheter in place.  Urology followup.        Lyanne Co, MD 01/24/12 1536

## 2012-01-25 LAB — URINE CULTURE
Colony Count: NO GROWTH
Culture: NO GROWTH

## 2012-01-28 ENCOUNTER — Encounter (HOSPITAL_COMMUNITY): Payer: Self-pay | Admitting: Emergency Medicine

## 2012-01-28 ENCOUNTER — Emergency Department (HOSPITAL_COMMUNITY)
Admission: EM | Admit: 2012-01-28 | Discharge: 2012-01-28 | Disposition: A | Discharge status: Home / Self Care | Payer: PRIVATE HEALTH INSURANCE | Attending: Emergency Medicine | Admitting: Emergency Medicine

## 2012-01-28 DIAGNOSIS — Z9889 Other specified postprocedural states: Secondary | ICD-10-CM | POA: Insufficient documentation

## 2012-01-28 DIAGNOSIS — Z466 Encounter for fitting and adjustment of urinary device: Secondary | ICD-10-CM | POA: Insufficient documentation

## 2012-01-28 DIAGNOSIS — G4730 Sleep apnea, unspecified: Secondary | ICD-10-CM | POA: Insufficient documentation

## 2012-01-28 DIAGNOSIS — E119 Type 2 diabetes mellitus without complications: Secondary | ICD-10-CM | POA: Insufficient documentation

## 2012-01-28 DIAGNOSIS — Z87448 Personal history of other diseases of urinary system: Secondary | ICD-10-CM | POA: Insufficient documentation

## 2012-01-28 DIAGNOSIS — Z79899 Other long term (current) drug therapy: Secondary | ICD-10-CM | POA: Insufficient documentation

## 2012-01-28 DIAGNOSIS — E785 Hyperlipidemia, unspecified: Secondary | ICD-10-CM | POA: Insufficient documentation

## 2012-01-28 DIAGNOSIS — I10 Essential (primary) hypertension: Secondary | ICD-10-CM | POA: Insufficient documentation

## 2012-01-28 DIAGNOSIS — G8929 Other chronic pain: Secondary | ICD-10-CM | POA: Insufficient documentation

## 2012-01-28 NOTE — ED Provider Notes (Signed)
History    This chart was scribed for Hilario Quarry, MD, MD by Smitty Pluck, ED Scribe. The patient was seen in room TR11C and the patient's care was started at 12:16PM.   CSN: [id number]  Arrival date & time 01/28/12  1147    No chief complaint on file.   (Consider location/radiation/quality/duration/timing/severity/associated sxs/prior treatment) The history is provided by the patient. No language interpreter was used.   Tyler Lester is a 66 y.o. male who presents to the Emergency Department due to foley catheter removal. The catheter was placed on 01/25/12 due pt having trouble urinating. He reports that he has hx of prostate surgery. He denies any other pain after placement of catheter.   Past Medical History  Diagnosis Date  . Diabetes mellitus   . Chronic pain   . Hypertension   . Hyperlipidemia   . Sleep apnea     Past Surgical History  Procedure Date  . Colon surgery     No family history on file.  History  Substance Use Topics  . Smoking status: Never Smoker   . Smokeless tobacco: Not on file  . Alcohol Use: Not on file      Review of Systems  Constitutional: Negative for fever and chills.  Respiratory: Negative for shortness of breath.   Gastrointestinal: Negative for nausea and vomiting.  Genitourinary: Negative for dysuria.  Neurological: Negative for weakness.  All other systems reviewed and are negative.    Allergies  Review of patient's allergies indicates no known allergies.  Home Medications   Current Outpatient Rx  Name  Route  Sig  Dispense  Refill  . CLONAZEPAM 0.5 MG PO TABS   Oral   Take 0.5 mg by mouth 2 (two) times daily as needed. For anxiety         . CYCLOBENZAPRINE HCL 10 MG PO TABS   Oral   Take 1 tablet (10 mg total) by mouth 2 (two) times daily as needed for muscle spasms.   60 tablet   0   . GLIMEPIRIDE 4 MG PO TABS   Oral   Take 4 mg by mouth daily.         Marland Kitchen HYDROCHLOROTHIAZIDE 25 MG PO TABS   Oral  Take 25 mg by mouth daily.         Marland Kitchen HYDROCODONE-ACETAMINOPHEN 7.5-750 MG PO TABS   Oral   Take 1 tablet by mouth daily as needed. For pain         . IBUPROFEN 800 MG PO TABS   Oral   Take 800 mg by mouth every 8 (eight) hours as needed. For pain         . KETOCONAZOLE 2 % EX CREA   Topical   Apply 1 application topically daily.         Marland Kitchen LISINOPRIL 10 MG PO TABS   Oral   Take 1 tablet (10 mg total) by mouth daily.   90 tablet   3   . METFORMIN HCL 1000 MG PO TABS   Oral   Take 1,000 mg by mouth 2 (two) times daily with a meal.         . METOPROLOL SUCCINATE ER 50 MG PO TB24   Oral   Take 50 mg by mouth daily. Take with or immediately following a meal.         . PANTOPRAZOLE SODIUM 40 MG PO TBEC   Oral   Take 1 tablet (40 mg total) by mouth  daily.   30 tablet   12   . PROBIOTIC PO CAPS   Oral   Take 1 capsule by mouth daily.   30 capsule   12   . TRAMADOL HCL 50 MG PO TABS   Oral   Take 50 mg by mouth every 8 (eight) hours as needed. For pain           BP 146/90  Pulse 92  Temp 97.8 F (36.6 C)  Resp 16  SpO2 98%  Physical Exam  Nursing note and vitals reviewed. Constitutional: He is oriented to person, place, and time. He appears well-developed and well-nourished. No distress.  HENT:  Head: Normocephalic and atraumatic.  Eyes: EOM are normal.  Neck: Neck supple. No tracheal deviation present.  Cardiovascular: Normal rate.   Pulmonary/Chest: Effort normal. No respiratory distress.  Musculoskeletal: Normal range of motion.  Neurological: He is alert and oriented to person, place, and time.  Skin: Skin is warm and dry.  Psychiatric: He has a normal mood and affect. His behavior is normal.    ED Course  Procedures (including critical care time) DIAGNOSTIC STUDIES: Oxygen Saturation is 98% on room air, normal by my interpretation.    COORDINATION OF CARE: 12:42 PM Discussed ED treatment with pt     Labs Reviewed - No data to  display No results found.   No diagnosis found.    MDM  I personally performed the services described in this documentation, which was scribed in my presence. The recorded information has been reviewed and considered.   Patient with history of urinary retention s/p foley catheter placed here 11/29.  Patient returned today for catheter removal.  Discussed with patient possible continued retention and need for replacement and that this is normally done in urology office.  Patient does not wish to wait for urology follow up and opts for catheter removal here.  Foley d/c'd by nursing.   Hilario Quarry, MD 02/02/12 [id number]

## 2012-01-28 NOTE — ED Notes (Signed)
Pt ambulatory leaving ED by self. Pt has no further questions upon d/c.

## 2012-01-28 NOTE — ED Notes (Signed)
Needs to have cath removed today placed on 11/29 no problems ua is clear

## 2012-01-31 ENCOUNTER — Ambulatory Visit: Payer: PRIVATE HEALTH INSURANCE | Admitting: Family Medicine

## 2012-02-07 ENCOUNTER — Ambulatory Visit (INDEPENDENT_AMBULATORY_CARE_PROVIDER_SITE_OTHER): Payer: PRIVATE HEALTH INSURANCE | Admitting: Family Medicine

## 2012-02-07 ENCOUNTER — Encounter: Payer: Self-pay | Admitting: Family Medicine

## 2012-02-07 VITALS — BP 138/80 | Temp 100.5°F | Ht 68.0 in | Wt 198.0 lb

## 2012-02-07 DIAGNOSIS — J189 Pneumonia, unspecified organism: Secondary | ICD-10-CM

## 2012-02-07 DIAGNOSIS — IMO0001 Reserved for inherently not codable concepts without codable children: Secondary | ICD-10-CM

## 2012-02-07 DIAGNOSIS — E1142 Type 2 diabetes mellitus with diabetic polyneuropathy: Secondary | ICD-10-CM

## 2012-02-07 DIAGNOSIS — R339 Retention of urine, unspecified: Secondary | ICD-10-CM

## 2012-02-07 DIAGNOSIS — E1149 Type 2 diabetes mellitus with other diabetic neurological complication: Secondary | ICD-10-CM

## 2012-02-07 DIAGNOSIS — R634 Abnormal weight loss: Secondary | ICD-10-CM

## 2012-02-07 LAB — CBC WITH DIFFERENTIAL/PLATELET
Basophils Absolute: 0 10*3/uL (ref 0.0–0.1)
Basophils Relative: 0 % (ref 0–1)
Eosinophils Absolute: 0 10*3/uL (ref 0.0–0.7)
Eosinophils Relative: 0 % (ref 0–5)
HCT: 45.1 % (ref 39.0–52.0)
Hemoglobin: 16 g/dL (ref 13.0–17.0)
Lymphocytes Relative: 9 % — ABNORMAL LOW (ref 12–46)
Lymphs Abs: 2.1 10*3/uL (ref 0.7–4.0)
MCH: 29.5 pg (ref 26.0–34.0)
MCHC: 35.5 g/dL (ref 30.0–36.0)
MCV: 83.2 fL (ref 78.0–100.0)
Monocytes Absolute: 1.6 10*3/uL — ABNORMAL HIGH (ref 0.1–1.0)
Monocytes Relative: 7 % (ref 3–12)
Neutro Abs: 20.3 10*3/uL — ABNORMAL HIGH (ref 1.7–7.7)
Neutrophils Relative %: 84 % — ABNORMAL HIGH (ref 43–77)
Platelets: 233 10*3/uL (ref 150–400)
RBC: 5.42 MIL/uL (ref 4.22–5.81)
RDW: 12.5 % (ref 11.5–15.5)
WBC: 24.1 10*3/uL — ABNORMAL HIGH (ref 4.0–10.5)

## 2012-02-07 LAB — POCT URINALYSIS DIPSTICK
Glucose, UA: NEGATIVE
Ketones, UA: NEGATIVE
Nitrite, UA: NEGATIVE
Protein, UA: >=300
Spec Grav, UA: >=1.03
Urobilinogen, UA: 4
pH, UA: 5.5

## 2012-02-07 LAB — HIV ANTIBODY (ROUTINE TESTING W REFLEX): HIV: NONREACTIVE

## 2012-02-07 LAB — POCT UA - MICROSCOPIC ONLY: RBC, urine, microscopic: >20

## 2012-02-07 LAB — POCT GLYCOSYLATED HEMOGLOBIN (HGB A1C): Hemoglobin A1C: 6.1

## 2012-02-07 LAB — PSA: PSA: 14.17 ng/mL — ABNORMAL HIGH (ref <=4.00)

## 2012-02-07 MED ORDER — TRAMADOL HCL 50 MG PO TABS: 50.0000 mg | ORAL_TABLET | Freq: Three times a day (TID) | ORAL | Status: DC | PRN

## 2012-02-07 MED ORDER — CYCLOBENZAPRINE HCL 10 MG PO TABS: 10.0000 mg | ORAL_TABLET | Freq: Two times a day (BID) | ORAL | Status: DC | PRN

## 2012-02-07 MED ORDER — HYDROCODONE-ACETAMINOPHEN 7.5-750 MG PO TABS: 1.0000 | ORAL_TABLET | Freq: Every day | ORAL | Status: DC | PRN

## 2012-02-07 MED ORDER — LEVOFLOXACIN 500 MG PO TABS: 500.0000 mg | ORAL_TABLET | Freq: Every day | ORAL | Status: DC

## 2012-02-07 NOTE — Progress Notes (Signed)
Patient ID: [name]    DOB: 07-21-1945, 66 y.o.   MRN: [id number] --- Subjective:  Nassir is a 66 y.o.male who presents for follow up: - cough: 1 month, productive of green mucus. Shortness of breath with a few yards. No sore throat, no rhinorrhea. He denies any night sweats. No hemoptysis.  - urinary retention: was seen in the ED on 11/28 for urinary retention, at which point foley was placed. Per his report foley was discontinued on 12/2 and replaced yesterday 12/11 at the urologist's office because of worsening retention. Thinks he has appointment with urology at end of the month. Blood in the urine but not since new foley in. No abdominal pain. No dysuria.  - letter for court: he has a court date for having misinformed police officers who were questioning him. I did not fully understand the full story he told me. He wanted me to write a a letter stating what medications he is taking in case it could help his case.   ROS: see HPI Past Medical History: reviewed and updated medications and allergies. Social History: Tobacco: denies  Objective: Filed Vitals:   02/07/12 1555  BP: 138/80  Temp: 100.5 F (38.1 C)    Physical Examination:   General appearance - alert, fatigued appearing  Ears - bilateral TM's and external ear canals normal Nose - normal and patent, no erythema, discharge or polyps Mouth - mucous membranes moist, pharynx normal without lesions Neck - supple, no significant adenopathy Chest - decreased breath sounds in lower left with crackles Heart - normal rate, regular rhythm, normal S1, S2, no murmurs, rubs, clicks or gallops Abdomen - soft, nontender, mildly distended, no masses or organomegaly, foley bag in place Extremities - no pedal edema

## 2012-02-07 NOTE — Patient Instructions (Signed)
I am going to treat you for pneumonia with antibiotics to take for 7 days. I would still like for you to get a chest xray tomorrow.   I will be in touch with you about the lab tests. It is very important for you to follow up with urology.   Please follow up with me in 2 weeks.

## 2012-02-09 DIAGNOSIS — IMO0001 Reserved for inherently not codable concepts without codable children: Secondary | ICD-10-CM | POA: Insufficient documentation

## 2012-02-09 DIAGNOSIS — R634 Abnormal weight loss: Secondary | ICD-10-CM | POA: Insufficient documentation

## 2012-02-09 DIAGNOSIS — J189 Pneumonia, unspecified organism: Secondary | ICD-10-CM | POA: Insufficient documentation

## 2012-02-09 DIAGNOSIS — R339 Retention of urine, unspecified: Secondary | ICD-10-CM | POA: Insufficient documentation

## 2012-02-09 LAB — CULTURE, URINE COMPREHENSIVE
Colony Count: NO GROWTH
Organism ID, Bacteria: NO GROWTH

## 2012-02-09 NOTE — Assessment & Plan Note (Signed)
Unintentional weight loss that was in the process of being worked up. Normal TSH, CBC. Had ordered PSA, HIV and CXR to evaluate for any occult malignancy that could be present at his age. He had not had a chance to get them done, so obtaining this today.

## 2012-02-09 NOTE — Assessment & Plan Note (Signed)
With fever andindwelling foley, will get UA and UCx. Patient to follow up with urologist. Obtaining PSA today.

## 2012-02-09 NOTE — Assessment & Plan Note (Signed)
Febrile with chest exam showing left sided decreased breath sounds and crackles. No respiratory distress. Will treat for CAP with levaquin as this would provide broader coverage than azithromycin.  Ordered chest xray for confirmation of pneumonia as well as evaluation for chronic cough and weight loss.

## 2012-02-09 NOTE — Assessment & Plan Note (Signed)
Told patient that I could not write a letter explaining his behavior based on his medical conditions. I can write a letter objectively stating what conditions are being treated.

## 2012-02-11 ENCOUNTER — Telehealth: Payer: Self-pay | Admitting: Family Medicine

## 2012-02-11 NOTE — Telephone Encounter (Signed)
Done. .Tyler Lester  

## 2012-02-11 NOTE — Telephone Encounter (Signed)
Patient is calling with a fax # for a letter about the type of medication he is taking to be sent to his landlord.  The fax # is [phone].  He needs this for when he goes to court.

## 2012-02-14 ENCOUNTER — Telehealth: Payer: Self-pay | Admitting: Family Medicine

## 2012-02-14 NOTE — Telephone Encounter (Signed)
Called patient to check up on him about his pneumonia that was diagnosed in clinic on 02/07/12. He states that he finished the antibiotics today and that he feels much better with improved work of breathing and shortness of breath.  Instructed patient to make appointment this coming Monday or Tuesday for follow up, just to make sure that he is clinically improved. Patient agreed to this.  Also told him about PSA being elevated at 14 and for him to follow up with urology. He states that he thinks he has an appointment on 02/25/12.

## 2012-02-18 ENCOUNTER — Ambulatory Visit (INDEPENDENT_AMBULATORY_CARE_PROVIDER_SITE_OTHER): Payer: PRIVATE HEALTH INSURANCE | Admitting: Family Medicine

## 2012-02-18 ENCOUNTER — Encounter: Payer: Self-pay | Admitting: Family Medicine

## 2012-02-18 VITALS — BP 135/72 | HR 99 | Temp 97.6°F | Ht 68.0 in | Wt 190.7 lb

## 2012-02-18 DIAGNOSIS — R339 Retention of urine, unspecified: Secondary | ICD-10-CM

## 2012-02-18 DIAGNOSIS — R5383 Other fatigue: Secondary | ICD-10-CM

## 2012-02-18 DIAGNOSIS — R531 Weakness: Secondary | ICD-10-CM

## 2012-02-18 DIAGNOSIS — R5381 Other malaise: Secondary | ICD-10-CM

## 2012-02-18 DIAGNOSIS — J189 Pneumonia, unspecified organism: Secondary | ICD-10-CM

## 2012-02-18 LAB — CBC WITH DIFFERENTIAL/PLATELET
Basophils Absolute: 0 10*3/uL (ref 0.0–0.1)
Basophils Relative: 0 % (ref 0–1)
Eosinophils Absolute: 0.2 10*3/uL (ref 0.0–0.7)
Eosinophils Relative: 2 % (ref 0–5)
HCT: 46 % (ref 39.0–52.0)
Hemoglobin: 16.6 g/dL (ref 13.0–17.0)
Lymphocytes Relative: 30 % (ref 12–46)
Lymphs Abs: 2.1 10*3/uL (ref 0.7–4.0)
MCH: 29.9 pg (ref 26.0–34.0)
MCHC: 36.1 g/dL — ABNORMAL HIGH (ref 30.0–36.0)
MCV: 82.7 fL (ref 78.0–100.0)
Monocytes Absolute: 0.5 10*3/uL (ref 0.1–1.0)
Monocytes Relative: 7 % (ref 3–12)
Neutro Abs: 4.4 10*3/uL (ref 1.7–7.7)
Neutrophils Relative %: 61 % (ref 43–77)
Platelets: 319 10*3/uL (ref 150–400)
RBC: 5.56 MIL/uL (ref 4.22–5.81)
RDW: 12.7 % (ref 11.5–15.5)
WBC: 7.1 10*3/uL (ref 4.0–10.5)

## 2012-02-18 LAB — VITAMIN B12: Vitamin B-12: 999 pg/mL — ABNORMAL HIGH (ref 211–911)

## 2012-02-18 LAB — TSH: TSH: 0.904 u[IU]/mL (ref 0.350–4.500)

## 2012-02-18 MED ORDER — TAMSULOSIN HCL 0.4 MG PO CAPS: 0.4000 mg | ORAL_CAPSULE | Freq: Every day | ORAL | Status: DC

## 2012-02-18 NOTE — Progress Notes (Signed)
Subjective: The patient is a 66 y.o. year old male who presents today for followup.  1. Pneumonia: Patient reports that his breathing is significantly better now he has finished his antibiotics. He still does have a mildly productive cough is more bothersome at times. He denies any fevers, chills, nausea, vomiting, diarrhea. He is not any shortness of breath.  2. Urinary retention: The patient still has an indwelling Foley secondary to 2 episodes of urinary retention around the beginning of the month. He was scheduled to have it removed in the [facility] room on the 30th of this month. He is asking if there is any possibility of having it removed sooner.  Patient's past medical, social, and family history were reviewed and updated as appropriate. History  Substance Use Topics  . Smoking status: Never Smoker   . Smokeless tobacco: Not on file  . Alcohol Use: Not on file   Objective:  Filed Vitals:   02/18/12 1046  BP: 135/72  Pulse: 99  Temp: 97.6 F (36.4 C)   Gen: No acute distress, breathing easily CV: RRR, no murmurs Resp: Clear to auscultation bilaterally with mildly decreased breath sounds globally.  Assessment/Plan:  Please also see individual problems in problem list for problem-specific plans.

## 2012-02-18 NOTE — Patient Instructions (Signed)
I want you to start taking flomax for your prostate and come back to see [place] Thursday morning to take out your foley. I want to get some blood work for your weakness. Please go to [facility] for a chest x-ray.

## 2012-02-18 NOTE — Assessment & Plan Note (Signed)
The exact history on this is slightly confusing. I do not see a clear plan regarding when the patient's Foley should be removed. It has been in for a number of weeks now. I will plan to start the patient on Flomax and have the Foley removed Thursday morning. This will give [place] 2 days to see if urinary retention recurs. If it does, she will definitely need to see a urologist. I understand that he is somewhat resistant to this but we will had no other choice.

## 2012-02-18 NOTE — Assessment & Plan Note (Signed)
Appears to be resolving. Patient never followed up with chest x-ray as requested. I would like to have him get one today to make certain that his chest x-ray appears to show resolving pneumonia. I do expect that there will be some abnormalities on his chest x-ray still at this time and he will likely need a repeat chest x-ray in 4-6 weeks to ensure the resolution.

## 2012-02-18 NOTE — Assessment & Plan Note (Signed)
Most likely related to recovering from pneumonia. However, I will check B12, vitamin D, and TSH.

## 2012-02-19 LAB — VITAMIN D 25 HYDROXY (VIT D DEFICIENCY, FRACTURES): Vit D, 25-Hydroxy: 20 ng/mL — ABNORMAL LOW (ref 30–89)

## 2012-02-22 ENCOUNTER — Ambulatory Visit
Admission: RE | Admit: 2012-02-22 | Discharge: 2012-02-22 | Disposition: A | Discharge status: Home / Self Care | Payer: PRIVATE HEALTH INSURANCE | Source: Ambulatory Visit | Attending: Family Medicine | Admitting: Family Medicine

## 2012-02-22 DIAGNOSIS — E1149 Type 2 diabetes mellitus with other diabetic neurological complication: Secondary | ICD-10-CM

## 2012-02-28 ENCOUNTER — Telehealth: Payer: Self-pay | Admitting: Family Medicine

## 2012-02-28 ENCOUNTER — Ambulatory Visit: Payer: PRIVATE HEALTH INSURANCE | Admitting: *Deleted

## 2012-02-28 DIAGNOSIS — R339 Retention of urine, unspecified: Secondary | ICD-10-CM

## 2012-02-28 NOTE — Progress Notes (Signed)
Patient in office as directed this AM. Foley catheter removed at 11:40 AM. Consulted with Dr. Sheffield Slider and Dr. Mauricio Po.  Patient advised if not able to void in 6-8 hours or is having pain go to ED . He voices understanding. He is taking  Flomax as directed.  Patient asks about recent chest xray . Dr. Mauricio Po advises no sign of pneumonia .   Notified to schedule appointment to follow up with Dr. Louanne Belton.

## 2012-02-28 NOTE — Telephone Encounter (Signed)
Patient says he needs you to call him concerning his medications.  He says they gave his 30 instead of 45 pills.

## 2012-02-28 NOTE — Telephone Encounter (Signed)
Called patient back. He was calling because he is having flare up of right hip pain requiring 2 tablets of vicodin daily. I will send in for a new Rx for vicodin 7.5/750 daily prn #15 tablets, no refill. His next refill will be on 03/10/11. Called in Rx to [facility] today.

## 2012-03-10 ENCOUNTER — Encounter: Payer: Self-pay | Admitting: Family Medicine

## 2012-03-10 MED ORDER — VITAMIN D3 10 MCG (400 UNIT) PO TABS: 800.0000 [IU] | ORAL_TABLET | Freq: Every day | ORAL | Status: DC

## 2012-03-10 NOTE — Addendum Note (Signed)
Addended by: Majel Homer D on: 03/10/2012 06:05 PM   Modules accepted: Orders

## 2012-03-24 ENCOUNTER — Ambulatory Visit: Payer: PRIVATE HEALTH INSURANCE | Admitting: Family Medicine

## 2012-03-25 ENCOUNTER — Encounter: Payer: Self-pay | Admitting: Family Medicine

## 2012-03-25 NOTE — Telephone Encounter (Signed)
This encounter was created in error - please disregard.

## 2012-03-27 ENCOUNTER — Ambulatory Visit (INDEPENDENT_AMBULATORY_CARE_PROVIDER_SITE_OTHER): Payer: PRIVATE HEALTH INSURANCE | Admitting: Family Medicine

## 2012-03-27 ENCOUNTER — Encounter: Payer: Self-pay | Admitting: Family Medicine

## 2012-03-27 VITALS — BP 156/98 | HR 88 | Temp 97.6°F | Ht 68.0 in | Wt 197.1 lb

## 2012-03-27 DIAGNOSIS — M25519 Pain in unspecified shoulder: Secondary | ICD-10-CM

## 2012-03-27 DIAGNOSIS — R05 Cough: Secondary | ICD-10-CM | POA: Insufficient documentation

## 2012-03-27 DIAGNOSIS — R059 Cough, unspecified: Secondary | ICD-10-CM | POA: Insufficient documentation

## 2012-03-27 MED ORDER — BENZONATATE 200 MG PO CAPS: 200.0000 mg | ORAL_CAPSULE | Freq: Two times a day (BID) | ORAL | Status: DC | PRN

## 2012-03-27 MED ORDER — DICLOFENAC SODIUM 1 % TD GEL: 2.0000 g | Freq: Three times a day (TID) | TRANSDERMAL | Status: DC | PRN

## 2012-03-27 MED ORDER — ACETAMINOPHEN-CODEINE #3 300-30 MG PO TABS: 1.0000 | ORAL_TABLET | Freq: Every evening | ORAL | Status: DC | PRN

## 2012-03-27 NOTE — Assessment & Plan Note (Signed)
Likely post-viral cough from recent pneumonia vs. URI.  No fever today.  No positive findings on exam concerning for pneumonia. - Will treat symptoms with Tessalon perles during the day and Tylenol #3 QHS PRN (not to be combined with pain medications) - Red flags reviewed and home remedies per handout - Follow up in 2 weeks with PCP to discuss chronic medical issues

## 2012-03-27 NOTE — Progress Notes (Signed)
  Subjective:    Patient ID: Tyler Lester, male    DOB: 07-12-1945, 67 y.o.   MRN: [id number]  HPI  Patient presents to same day appointment for productive cough.  Was recently seen in ED 3 weeks ago for pneumonia.  Still complains of wet cough and sore throat.  He completed course of antibiotics.  Coughing up green phlegm.  For cough symptoms, he has take OTC Robitussin which helped a little.  Coughing through the day, but worse at night.  Endorses runny nose.  Denies any associated chest pain, shortness of breath.  Denies any fever, chills, nausea/vomiting.    Patient also requesting Voltaren gel for hx of rotator cuff injury about 20-25 years ago.  He says the cold weather has made the pain worse.    Never smoker.   Review of Systems Per HPI    Objective:   Physical Exam  Constitutional: He appears well-nourished. No distress.  HENT:  Head: Normocephalic and atraumatic.  Mouth/Throat: No oropharyngeal exudate.       MM dry  Cardiovascular: Normal rate and normal heart sounds.   No murmur heard. Pulmonary/Chest: Effort normal and breath sounds normal. He has no wheezes. He has no rales.  Lymphadenopathy:    He has no cervical adenopathy.  Skin: Skin is dry. No rash noted.  MSK: limited abduction both shoulders, LT worse than RT    Assessment & Plan:

## 2012-03-27 NOTE — Patient Instructions (Addendum)
It was good to see you today, Tyler Lester.  I am sorry you are not feeling well. Please pick up prescriptions for cough at your pharmacy. For cough and sore throat, please purchase CEPACOL cough drops. If you develop worsening symptoms, fever (temp > 101 degrees), persistent cough, or nausea/vomiting, please return to clinic. Hope you feel better soon.  Upper Respiratory Infection, Adult  An upper respiratory infection (URI) is also known as the common cold. It is often caused by a type of germ (virus). Colds are easily spread (contagious). You can pass it to others by kissing, coughing, sneezing, or drinking out of the same glass. Usually, you get better in 1 or 2 weeks.  HOME CARE  Only take medicine as told by your doctor.  Use a warm mist humidifier or breathe in steam from a hot shower.  Drink enough water and fluids to keep your pee (urine) clear or pale yellow.  Get plenty of rest.  Return to work when your temperature is back to normal or as told by your doctor. You may use a face mask and wash your hands to stop your cold from spreading. GET HELP RIGHT AWAY IF:  After the first few days, you feel you are getting worse.  You have questions about your medicine.  You have chills, shortness of breath, or brown or red spit (mucus).  You have yellow or brown snot (nasal discharge) or pain in the face, especially when you bend forward.  You have a fever, puffy (swollen) neck, pain when you swallow, or white spots in the back of your throat.  You have a bad headache, ear pain, sinus pain, or chest pain.  You have a high-pitched whistling sound when you breathe in and out (wheezing).  You have a lasting cough or cough up blood.  You have sore muscles or a stiff neck. MAKE SURE YOU:  Understand these instructions.  Will watch your condition.  Will get help right away if you are not doing well or get worse. Document Released: 08/01/2007 Document Revised: 05/07/2011 Document Reviewed: 06/19/2010   [facility] Patient Information 2013 [place], [place].

## 2012-03-27 NOTE — Assessment & Plan Note (Signed)
Acute on chronic shoulder pain.  Will refill Voltaren gel today, but advised patient to schedule follow up appointment with PCP in 2 weeks to discuss pain control and other pain medication refills.  He also asked me about a handicap placard which he will bring to next appointment.

## 2012-04-14 ENCOUNTER — Telehealth: Payer: Self-pay | Admitting: Family Medicine

## 2012-04-14 NOTE — Telephone Encounter (Signed)
Patient is calling for a refill on his Vicodin.  He also wants the doctor to know that last month he didn't get the total amount.  The drug store gave him 30 but he was supposed to get 45.  Please call him when it is ready for pick up.

## 2012-04-15 NOTE — Telephone Encounter (Signed)
Forward to PCP for refill request on pain med

## 2012-04-15 NOTE — Telephone Encounter (Signed)
Pt is calling again to see if he can get this medicine

## 2012-04-16 ENCOUNTER — Telehealth: Payer: Self-pay | Admitting: Family Medicine

## 2012-04-16 ENCOUNTER — Other Ambulatory Visit: Payer: Self-pay | Admitting: Family Medicine

## 2012-04-16 MED ORDER — HYDROCODONE-ACETAMINOPHEN 7.5-500 MG PO TABS: 1.0000 | ORAL_TABLET | Freq: Two times a day (BID) | ORAL | Status: DC | PRN

## 2012-04-16 MED ORDER — HYDROCODONE-ACETAMINOPHEN 7.5-750 MG PO TABS: 1.0000 | ORAL_TABLET | Freq: Two times a day (BID) | ORAL | Status: DC | PRN

## 2012-04-16 NOTE — Telephone Encounter (Signed)
Called patient and told him the Rx is ready for him to pick up and that he will need to schedule and keep that appointment for any further refills.Tyler Lester, [name]

## 2012-04-16 NOTE — Telephone Encounter (Signed)
[facility] calling because Vicodin ES 7.5/750 mg is no longer available.  Can Rx be changed to 7.5/325 mg?  Will route request to Dr. Gwenlyn Saran and call pharmacy back.  Gaylene Brooks, RN

## 2012-04-16 NOTE — Telephone Encounter (Signed)
Received notice from drugstore stating that it does not cover vicodin 7.5/750mg . Changed Rx to 7.5/500 bid/prn for 14 day supply. 30 tabs. No refills.

## 2012-04-16 NOTE — Telephone Encounter (Signed)
Wil refill Rx for vicodin 7.5mg /750mg  bid/prn #30tabs, enough for 2 week supply. Patient needs an appointment with me before I can refill anymore pain medications.

## 2012-04-16 NOTE — Telephone Encounter (Signed)
Patient is calling back checking on the status of the refill on his Vicodin.

## 2012-04-23 ENCOUNTER — Encounter: Payer: Self-pay | Admitting: Family Medicine

## 2012-04-23 ENCOUNTER — Ambulatory Visit (INDEPENDENT_AMBULATORY_CARE_PROVIDER_SITE_OTHER): Payer: PRIVATE HEALTH INSURANCE | Admitting: Family Medicine

## 2012-04-23 VITALS — BP 139/81 | HR 96 | Temp 98.3°F | Ht 68.0 in | Wt 202.1 lb

## 2012-04-23 DIAGNOSIS — R339 Retention of urine, unspecified: Secondary | ICD-10-CM

## 2012-04-23 DIAGNOSIS — G8929 Other chronic pain: Secondary | ICD-10-CM

## 2012-04-23 DIAGNOSIS — M542 Cervicalgia: Secondary | ICD-10-CM

## 2012-04-23 DIAGNOSIS — I10 Essential (primary) hypertension: Secondary | ICD-10-CM

## 2012-04-23 LAB — BASIC METABOLIC PANEL
BUN: 15 mg/dL (ref 6–23)
CO2: 28 mEq/L (ref 19–32)
Calcium: 9.6 mg/dL (ref 8.4–10.5)
Chloride: 103 mEq/L (ref 96–112)
Creat: 1.1 mg/dL (ref 0.50–1.35)
Glucose, Bld: 130 mg/dL — ABNORMAL HIGH (ref 70–99)
Potassium: 3.5 mEq/L (ref 3.5–5.3)
Sodium: 139 mEq/L (ref 135–145)

## 2012-04-23 MED ORDER — DICLOFENAC SODIUM 1 % TD GEL: 4.0000 g | Freq: Four times a day (QID) | TRANSDERMAL | Status: DC

## 2012-04-23 MED ORDER — HYDROCODONE-ACETAMINOPHEN 7.5-500 MG PO TABS: 1.0000 | ORAL_TABLET | Freq: Four times a day (QID) | ORAL | Status: DC | PRN

## 2012-04-23 NOTE — Progress Notes (Signed)
Patient ID: Tyler Lester    DOB: 08-Mar-1945, 67 y.o.   MRN: [id number] --- Subjective:  Tyler Lester is a 67 y.o.male who presents with the following concerns:  - left neck swelling: 4 days, painful: throbbing, constant, difficult at moving neck, no exercise in that area.  Occasional cough, improved, mucus productive. No congestion or rhinorrhea, no sore throat.  No shortness of breath.  tassalon perles helped.  - right hip pain: chronic x2 years, worst across the back. vicodin helped. Ran out of vicodin a couple of days ago. Takes it twice daily. No new lower extremity weakness, urine or bowel incontinence. Patient thinks right leg may be shorter than left and would like that to be evaluated. Walks with a cane. Is able to walk 1/2 block.  - prostate and urologic complaints: seen at [facility] and has follow up appointment soon.   ROS: see HPI Past Medical History: reviewed and updated medications and allergies. Social History: Tobacco: none  Objective: Filed Vitals:   04/23/12 1426  BP: 139/81  Pulse: 96  Temp: 98.3 F (36.8 C)    Physical Examination:   General appearance - alert, well appearing, and in no distress Neck - supple, no tenderness along cervical spine. Tenderness along left sternocleidomastoid muscle. Some limitation in movement to the left side. No lymphadenopathy. Normal range of motion of shoulders.  Chest - clear to auscultation, no wheezes, rales or rhonchi, symmetric air entry Heart - normal rate, regular rhythm, normal S1, S2, no murmurs, rubs, clicks or gallops Extremities - peripheral pulses normal, no pedal edema, no clubbing or cyanosis Right hip - tenderness to palpation along sacroiliac joint, 4+/5 strength with knee flexion, extension and hip flexion bilaterally,

## 2012-04-23 NOTE — Patient Instructions (Signed)
For the neck pain, I am going to get an xray to make sure everything with your neck is ok.  For the pain, the vicodin for your hip will help with the neck. Please stop taking ibuprofen until I see the results of the lab work.  I am sending a prescription for a gel that could help. If this gel is too expensive, you can use over the counter: aspercreme.

## 2012-04-25 DIAGNOSIS — M542 Cervicalgia: Secondary | ICD-10-CM | POA: Insufficient documentation

## 2012-04-25 NOTE — Assessment & Plan Note (Signed)
Refilled vicodin 7.5/500mg  60 tablets, 1 refill.  Will send referral to PT to evaluate and treat.

## 2012-04-25 NOTE — Assessment & Plan Note (Signed)
Follow-up with [facility] °

## 2012-04-25 NOTE — Assessment & Plan Note (Signed)
No evidence of lymphadenopathy. 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 Since patient greater than 65yo, will obtain spinal xray to rule out any acute process.  Otherwise, treat pain with vicodin, warm compresses and voltaren gel. No oral NSAID's at this time due to marginally elevated Cr.

## 2012-05-01 ENCOUNTER — Ambulatory Visit: Payer: PRIVATE HEALTH INSURANCE | Admitting: Family Medicine

## 2012-05-14 ENCOUNTER — Ambulatory Visit (INDEPENDENT_AMBULATORY_CARE_PROVIDER_SITE_OTHER): Payer: PRIVATE HEALTH INSURANCE | Admitting: Sports Medicine

## 2012-05-14 ENCOUNTER — Encounter: Payer: Self-pay | Admitting: Sports Medicine

## 2012-05-14 VITALS — BP 145/75 | HR 85 | Temp 97.7°F | Ht 68.0 in | Wt 205.4 lb

## 2012-05-14 DIAGNOSIS — R609 Edema, unspecified: Secondary | ICD-10-CM

## 2012-05-14 DIAGNOSIS — L602 Onychogryphosis: Secondary | ICD-10-CM | POA: Insufficient documentation

## 2012-05-14 DIAGNOSIS — L603 Nail dystrophy: Secondary | ICD-10-CM

## 2012-05-14 DIAGNOSIS — R600 Localized edema: Secondary | ICD-10-CM | POA: Insufficient documentation

## 2012-05-14 DIAGNOSIS — L608 Other nail disorders: Secondary | ICD-10-CM

## 2012-05-14 LAB — BASIC METABOLIC PANEL
BUN: 21 mg/dL (ref 6–23)
CO2: 31 mEq/L (ref 19–32)
Calcium: 10.4 mg/dL (ref 8.4–10.5)
Chloride: 100 mEq/L (ref 96–112)
Creat: 1.39 mg/dL — ABNORMAL HIGH (ref 0.50–1.35)
Glucose, Bld: 135 mg/dL — ABNORMAL HIGH (ref 70–99)
Potassium: 3.4 mEq/L — ABNORMAL LOW (ref 3.5–5.3)
Sodium: 142 mEq/L (ref 135–145)

## 2012-05-14 MED ORDER — FUROSEMIDE 20 MG PO TABS: 20.0000 mg | ORAL_TABLET | Freq: Every day | ORAL | Status: DC

## 2012-05-14 NOTE — Assessment & Plan Note (Addendum)
Trimmed today per pt request >Consider 1 yr course of Lamsil if no concerns for compliance

## 2012-05-14 NOTE — Assessment & Plan Note (Addendum)
Given elevated BP and + JVD suspect volume overload + PND but no orthopnea, DOE or chest pain, EKG deferred today Check BMP & Start Lasix Elevate feet prn >titrate lasix >consider 2D ECHO vs cards eval > consider compression socks

## 2012-05-14 NOTE — Patient Instructions (Addendum)
It was nice to see you today.   Today we discussed: 1. Bilateral lower extremity edema Try taking: - furosemide (LASIX) 20 MG tablet; Take 1 tablet (20 mg total) by mouth daily. Take 1 tablet twice a day for the next 5 days then use once per day  Dispense: 30 tablet; Refill: 3 I am checking labs today  2. Nail dystrophy Soak your feet in warm clean water daily. Talk to Dr. Gwenlyn Saran about treating your nails if you are interested   Please plan to return to see Dr. Gwenlyn Saran in 2-3 weeks.  If you need anything prior to seeing her please call the clinic.  Please Bring all medications with you to each appointment.

## 2012-05-18 NOTE — Progress Notes (Signed)
  [name]  Patient name: Tyler Lester MRN [id number]  Date of birth: Jun 27, 1945  CC & HPI:  Tyler Lester is a 67 y.o. male presenting today for LE edema and dystrophic nails.  Reports swelling in LE has gotten worse over the past 2 weeks.  Denies orthpnea, chest pain, or DOE.  +PND X 1-2X/week.  BP poorly controlled.  + Increased salt intake. incidious onset, No hx of DVT, no cough, no hemoptysis  Notes small blister over Left 1st toe  # Nail dystrophy.  Has not been able to have toe nails trimmed in >6 months causing discomfort in shoes  ROS:  No fevers, no chills. No chest pain, syncope, palpitations. + Claudication like symptoms  Pertinent History Reviewed:  Medical & Surgical Hx:  Reviewed: Significant for HTN, DM, Chronic pain, long standing Athlete's foot, impotence,  Medications: Reviewed & Updated - see associated section Social History: Reviewed -  reports that he has never smoked. He does not have any smokeless tobacco history on file.  Objective Findings:  Vitals: BP 145/75  Pulse 85  Temp(Src) 97.7 F (36.5 C) (Oral)  Ht 5\' 8"  (1.727 m)  Wt 205 lb 6.4 oz (93.169 kg)  BMI 31.24 kg/m2  PE: GENERAL:  Adult AA  male. In no discomfort; no respiratory distress. PSYCH: Alert and appropriately interactive; Insight:Fair   H&N: AT/[place], trachea midline EENT:  MMM, no scleral icterus, EOMi HEART: RRR, S1/S2 heard, no murmur LUNGS: CTA B, no wheezes, no crackles EXTREMITIES: Moves all 4 extremities spontaneously, warm well perfused, 2+/4 edema, bilateral DP and PT pulses 2/4.  Markedly dystrophic nails bilaterally, >1cm on 3,4,5th B    Assessment & Plan:

## 2012-05-19 ENCOUNTER — Other Ambulatory Visit: Payer: Self-pay | Admitting: *Deleted

## 2012-05-19 NOTE — Addendum Note (Signed)
Addended by: Gaspar Bidding D on: 05/19/2012 09:10 AM   Modules accepted: Level of Service

## 2012-05-20 MED ORDER — CYCLOBENZAPRINE HCL 10 MG PO TABS: 10.0000 mg | ORAL_TABLET | Freq: Two times a day (BID) | ORAL | Status: DC | PRN

## 2012-05-23 ENCOUNTER — Ambulatory Visit (INDEPENDENT_AMBULATORY_CARE_PROVIDER_SITE_OTHER): Payer: PRIVATE HEALTH INSURANCE | Admitting: Family Medicine

## 2012-05-23 ENCOUNTER — Encounter: Payer: Self-pay | Admitting: Family Medicine

## 2012-05-23 VITALS — BP 137/83 | HR 91 | Temp 98.2°F | Wt 208.0 lb

## 2012-05-23 DIAGNOSIS — E1142 Type 2 diabetes mellitus with diabetic polyneuropathy: Secondary | ICD-10-CM

## 2012-05-23 DIAGNOSIS — E1149 Type 2 diabetes mellitus with other diabetic neurological complication: Secondary | ICD-10-CM

## 2012-05-23 DIAGNOSIS — N179 Acute kidney failure, unspecified: Secondary | ICD-10-CM

## 2012-05-23 DIAGNOSIS — R600 Localized edema: Secondary | ICD-10-CM

## 2012-05-23 DIAGNOSIS — R609 Edema, unspecified: Secondary | ICD-10-CM

## 2012-05-23 LAB — COMPREHENSIVE METABOLIC PANEL
ALT: 15 U/L (ref 0–53)
AST: 20 U/L (ref 0–37)
Albumin: 3.9 g/dL (ref 3.5–5.2)
Alkaline Phosphatase: 53 U/L (ref 39–117)
BUN: 24 mg/dL — ABNORMAL HIGH (ref 6–23)
CO2: 31 mEq/L (ref 19–32)
Calcium: 9.7 mg/dL (ref 8.4–10.5)
Chloride: 99 mEq/L (ref 96–112)
Creat: 1.22 mg/dL (ref 0.50–1.35)
Glucose, Bld: 171 mg/dL — ABNORMAL HIGH (ref 70–99)
Potassium: 3.5 mEq/L (ref 3.5–5.3)
Sodium: 139 mEq/L (ref 135–145)
Total Bilirubin: 0.3 mg/dL (ref 0.3–1.2)
Total Protein: 6.9 g/dL (ref 6.0–8.3)

## 2012-05-23 MED ORDER — TAMSULOSIN HCL 0.4 MG PO CAPS: 0.4000 mg | ORAL_CAPSULE | Freq: Every day | ORAL | Status: DC

## 2012-05-23 MED ORDER — GABAPENTIN 300 MG PO CAPS: 300.0000 mg | ORAL_CAPSULE | Freq: Every day | ORAL | Status: DC

## 2012-05-23 NOTE — Progress Notes (Addendum)
Patient ID: Tyler Lester    DOB: [birth year], 67 y.o.   MRN: [id number] --- Subjective:  Tyler Lester is a 67 y.o.male who presents with the following concerns.  - cough: x1 week. Coughing up clear mucus, no blood, associated with shortness of breath. No fevers or chills. No rhinorrhea. Get short winded after 100 feet. - lower extremity swelling: right more than left. X 1week. soeaked it with epston salt for couple of days. Taking lasix 20mg  bid and has helped a little bit with the lower extremity swelling. Also reports shortness of breath with walking 100 yards. Can't sleep laying flat, needs 2-3 pillows. This has been ongoing for months. Also reports some numbness and loss of sensation in lower extremities.  Was seen one week ago for lower extremity swelling and was prescribed lasix 20mg  bid.    ROS: see HPI Past Medical History: reviewed and updated medications and allergies. Social History: Tobacco: denies  Objective: Filed Vitals:   05/23/12 1504  BP: 137/83  Pulse: 91  Temp: 98.2 F (36.8 C)    Physical Examination:   General appearance - alert, well appearing, and in no distress Chest - clear to auscultation, no wheezes, rales or rhonchi, symmetric air entry Heart - normal rate, regular rhythm, normal S1, S2, no murmurs, rubs, clicks or gallops Extremities - shiny, venous stasis changes bilaterally with trace pedal edema bilaterally, no sensation to mcrofilament testing in feet bilaterally.  Area of sloughed off skin on lateral aspect of right toe as well as medial left great toe, no warmth, no erythema

## 2012-05-23 NOTE — Patient Instructions (Addendum)
For now, do not take anything that says on the ingredient list: NSAID: ibuprofen, aleve It's ok to take tylenol (acetaminophen).  I will let you know of the results of the lab work. You can take lasix 20mg  every day.   For the foot, use vaseline on the areas and cover it with bandaids.  Follow up in 1 week.

## 2012-05-24 LAB — PRO B NATRIURETIC PEPTIDE: Pro B Natriuretic peptide (BNP): 7.01 pg/mL (ref <126)

## 2012-05-24 NOTE — Assessment & Plan Note (Addendum)
Differential includes heart failure vs venous stasis vs low albumin Will check BNP to evaluate any component of heart failure as well as an echo. Will also obtain CMP for kidney function evaluation as well as albumin level. Patient had small increase in creatinine recently. Will follow up on this.  Continue lasix 20mg  daily for now.

## 2012-05-25 NOTE — Assessment & Plan Note (Signed)
Neuropathy. Foot sores present, without signs of infection. Rx for diabetic shoes written. PAtient to cover areas with vaseline and bandaid to avoid friction from shoe.

## 2012-05-26 ENCOUNTER — Other Ambulatory Visit: Payer: Self-pay

## 2012-05-26 ENCOUNTER — Emergency Department (HOSPITAL_COMMUNITY)
Admission: EM | Admit: 2012-05-26 | Discharge: 2012-05-26 | Discharge status: Against Medical Advice | Payer: PRIVATE HEALTH INSURANCE | Attending: Emergency Medicine | Admitting: Emergency Medicine

## 2012-05-26 ENCOUNTER — Encounter (HOSPITAL_COMMUNITY): Payer: Self-pay | Admitting: *Deleted

## 2012-05-26 DIAGNOSIS — R209 Unspecified disturbances of skin sensation: Secondary | ICD-10-CM | POA: Insufficient documentation

## 2012-05-26 DIAGNOSIS — R079 Chest pain, unspecified: Secondary | ICD-10-CM | POA: Insufficient documentation

## 2012-05-26 DIAGNOSIS — M7989 Other specified soft tissue disorders: Secondary | ICD-10-CM | POA: Insufficient documentation

## 2012-05-26 LAB — POCT I-STAT, CHEM 8
BUN: 27 mg/dL — ABNORMAL HIGH (ref 6–23)
Calcium, Ion: 1.31 mmol/L — ABNORMAL HIGH (ref 1.13–1.30)
Chloride: 103 mEq/L (ref 96–112)
Creatinine, Ser: 1.2 mg/dL (ref 0.50–1.35)
Glucose, Bld: 161 mg/dL — ABNORMAL HIGH (ref 70–99)
HCT: 48 % (ref 39.0–52.0)
Hemoglobin: 16.3 g/dL (ref 13.0–17.0)
Potassium: 3.6 mEq/L (ref 3.5–5.1)
Sodium: 141 mEq/L (ref 135–145)
TCO2: 29 mmol/L (ref 0–100)

## 2012-05-26 LAB — CBC
HCT: 43.5 % (ref 39.0–52.0)
Hemoglobin: 15.2 g/dL (ref 13.0–17.0)
MCH: 30.6 pg (ref 26.0–34.0)
MCHC: 34.9 g/dL (ref 30.0–36.0)
MCV: 87.5 fL (ref 78.0–100.0)
Platelets: 194 10*3/uL (ref 150–400)
RBC: 4.97 MIL/uL (ref 4.22–5.81)
RDW: 13.2 % (ref 11.5–15.5)
WBC: 6.9 10*3/uL (ref 4.0–10.5)

## 2012-05-26 LAB — POCT I-STAT TROPONIN I: Troponin i, poc: 0 ng/mL (ref 0.00–0.08)

## 2012-05-26 NOTE — ED Notes (Signed)
Pt state R LE swelling x 1 week an tingling to L chest since last night.  Due for echo on 06/03/12.

## 2012-05-26 NOTE — ED Notes (Signed)
Called no answer times 3

## 2012-05-26 NOTE — ED Notes (Signed)
Pt did not answer x 2 

## 2012-06-03 ENCOUNTER — Ambulatory Visit (HOSPITAL_COMMUNITY)
Admission: RE | Admit: 2012-06-03 | Discharge: 2012-06-03 | Disposition: A | Discharge status: Home / Self Care | Payer: PRIVATE HEALTH INSURANCE | Source: Ambulatory Visit | Attending: Family Medicine | Admitting: Family Medicine

## 2012-06-03 ENCOUNTER — Telehealth: Payer: Self-pay | Admitting: Family Medicine

## 2012-06-03 ENCOUNTER — Other Ambulatory Visit: Payer: Self-pay | Admitting: Family Medicine

## 2012-06-03 DIAGNOSIS — N179 Acute kidney failure, unspecified: Secondary | ICD-10-CM

## 2012-06-03 NOTE — Telephone Encounter (Signed)
Forward to PCP for refills

## 2012-06-03 NOTE — Telephone Encounter (Signed)
The patient called back and asked for the Rx for Lisinopril be sent to [facility] on [address].

## 2012-06-03 NOTE — Telephone Encounter (Signed)
Patient is calling because he is completely out of his Hydrocodone and Lisinopril so he needs these refilled as soon as possible.  He needs to be called when both of these Rx's are ready to be picked up.

## 2012-06-04 ENCOUNTER — Telehealth: Payer: Self-pay | Admitting: Family Medicine

## 2012-06-04 DIAGNOSIS — I10 Essential (primary) hypertension: Secondary | ICD-10-CM

## 2012-06-04 MED ORDER — LISINOPRIL 10 MG PO TABS: 10.0000 mg | ORAL_TABLET | Freq: Every day | ORAL | Status: DC

## 2012-06-04 NOTE — Telephone Encounter (Signed)
Patient is calling to find out if his prescription for his pain medication is ready to be picked up and if his prescription for Lisinopril has been sent to his pharmacy.

## 2012-06-04 NOTE — Telephone Encounter (Signed)
Sent Rx for lisinopril 10mg  daily at [facility] [address].  Also called pharmacy for refill on norco 7.5/325mg  q6/prn #60 tabs.   Marena Chancy, PGY-2 Family Medicine Resident

## 2012-06-11 NOTE — Telephone Encounter (Signed)
Has this been addressed?

## 2012-06-17 ENCOUNTER — Telehealth: Payer: Self-pay | Admitting: Family Medicine

## 2012-06-17 DIAGNOSIS — I10 Essential (primary) hypertension: Secondary | ICD-10-CM

## 2012-06-17 MED ORDER — METOPROLOL SUCCINATE ER 50 MG PO TB24: 50.0000 mg | ORAL_TABLET | Freq: Every day | ORAL | Status: DC

## 2012-06-17 MED ORDER — HYDROCHLOROTHIAZIDE 25 MG PO TABS: 25.0000 mg | ORAL_TABLET | Freq: Every day | ORAL | Status: DC

## 2012-06-17 MED ORDER — LISINOPRIL 10 MG PO TABS: 10.0000 mg | ORAL_TABLET | Freq: Every day | ORAL | Status: DC

## 2012-06-17 NOTE — Telephone Encounter (Signed)
Patient is calling because he says he has contacted his pharmacy for a refill on his Blood Pressure medication of which he doesn't know the name of.  He says they they have told him that they have faxed it over, but I don't see any record of it.

## 2012-06-17 NOTE — Telephone Encounter (Signed)
Patient is on 3 blood pressure medications: lisinopril 10mg , toprol XL 50, hctz 25mg . I did not receive anything from the pharmacy for refills.  Will send refills for all three and patient can see which one he is out of.

## 2012-06-17 NOTE — Telephone Encounter (Signed)
Forward to PCP for refill

## 2012-06-24 ENCOUNTER — Other Ambulatory Visit: Payer: Self-pay | Admitting: Family Medicine

## 2012-06-24 NOTE — Telephone Encounter (Signed)
Submitted refill form for vicodin 7.5mg /325 every 6hrs/prn 60 tablets 1 refill. Sent to [facility].   Marena Chancy, PGY-2 Family Medicine Resident

## 2012-06-26 HISTORY — PX: CATARACT EXTRACTION W/ INTRAOCULAR LENS IMPLANT: SHX1309

## 2012-07-01 ENCOUNTER — Encounter: Payer: Self-pay | Admitting: Family Medicine

## 2012-07-01 ENCOUNTER — Ambulatory Visit (INDEPENDENT_AMBULATORY_CARE_PROVIDER_SITE_OTHER): Payer: PRIVATE HEALTH INSURANCE | Admitting: Family Medicine

## 2012-07-01 ENCOUNTER — Ambulatory Visit (HOSPITAL_COMMUNITY)
Admission: RE | Admit: 2012-07-01 | Discharge: 2012-07-01 | Disposition: A | Discharge status: Home / Self Care | Payer: PRIVATE HEALTH INSURANCE | Source: Ambulatory Visit | Attending: Family Medicine | Admitting: Family Medicine

## 2012-07-01 VITALS — BP 142/82 | HR 92 | Temp 98.0°F | Ht 69.0 in | Wt 211.0 lb

## 2012-07-01 DIAGNOSIS — R609 Edema, unspecified: Secondary | ICD-10-CM | POA: Insufficient documentation

## 2012-07-01 DIAGNOSIS — M79609 Pain in unspecified limb: Secondary | ICD-10-CM | POA: Insufficient documentation

## 2012-07-01 DIAGNOSIS — R600 Localized edema: Secondary | ICD-10-CM

## 2012-07-01 DIAGNOSIS — M7989 Other specified soft tissue disorders: Secondary | ICD-10-CM

## 2012-07-01 DIAGNOSIS — G609 Hereditary and idiopathic neuropathy, unspecified: Secondary | ICD-10-CM

## 2012-07-01 DIAGNOSIS — G629 Polyneuropathy, unspecified: Secondary | ICD-10-CM

## 2012-07-01 MED ORDER — GABAPENTIN 300 MG PO CAPS: 300.0000 mg | ORAL_CAPSULE | Freq: Three times a day (TID) | ORAL | Status: DC

## 2012-07-01 NOTE — Progress Notes (Addendum)
*  PRELIMINARY RESULTS* Vascular Ultrasound Right lower extremity venous duplex has been completed.  Preliminary findings: Negative for DVT involving the right lower extremity. There is superficial thrombosis involving a varicosity in the mid calf.   Attempted call report to [phone]. Left message at 3:45pm. Attempted call again at 3:55pm, still no answer. Let patient leave since MD could not be contacted.   Farrel Demark, RDMS, RVT  07/01/2012, 3:48 PM

## 2012-07-01 NOTE — Patient Instructions (Addendum)
Dear Mr. Vancuren,   I think you are dealing with neuropathy. So I want you to take Gabapentin three times a day. A new refill has been sent to your pharmacy. Also, we need to make sure there is not a blood clot. Our nurse will help you get to the radiology department at [facility].   Please follow up in 1 weeks.   Dr. Clinton Sawyer

## 2012-07-01 NOTE — Progress Notes (Signed)
  Subjective:    Patient ID: Tyler Lester, male    DOB: Mar 20, 1945, 67 y.o.   MRN: [id number]  HPI  67 year old M with subacute RLE edema that has become painful in the last 2 days. Pain in sole of right foot and on medial and lateral lower extremity distal to the knee. Pain in described as stinging, burning, and severe.   He was seen for this problem in march 2014 and ordered to increase his furosemide. His pro-BNP, kidney function and albumin have been normal. An echo was ordered, and the patient claims to have had the study, but no record exists in Epic.    Review of Systems     Objective:   Physical Exam BP 142/82  Pulse 92  Temp(Src) 98 F (36.7 C) (Oral)  Ht 5\' 9"  (1.753 m)  Wt 211 lb (95.709 kg)  BMI 31.15 kg/m2  Extremties: RLE: 44 cm diameter, chronic skin changes - hyperpigmented, shiny, hairless, with numerous firm nodules on anterior shin; TTP right planta surface of 2nd metatarsal; TTP medial calf; 1+ DP pulse; good cap refill LLE: 44 cm diameter, chronic skin changes, no tenderness, 1+ DP pulse, good cap refill      Assessment & Plan:  Concern for exacerbation of neuropathy given burning and tingling sensation vs DVT. Increase neurontin to 300 mg TID and obtain venous duplex doppler to rule out DVT.

## 2012-07-02 ENCOUNTER — Telehealth: Payer: Self-pay | Admitting: Family Medicine

## 2012-07-02 ENCOUNTER — Ambulatory Visit: Payer: PRIVATE HEALTH INSURANCE | Admitting: Family Medicine

## 2012-07-02 NOTE — Telephone Encounter (Signed)
Patient informed that venous duplex negative for DVT. No need for further intervention at this time. Patient in agreement.

## 2012-07-03 ENCOUNTER — Ambulatory Visit: Payer: PRIVATE HEALTH INSURANCE | Admitting: Family Medicine

## 2012-07-11 ENCOUNTER — Ambulatory Visit (INDEPENDENT_AMBULATORY_CARE_PROVIDER_SITE_OTHER): Payer: PRIVATE HEALTH INSURANCE | Admitting: Family Medicine

## 2012-07-11 ENCOUNTER — Encounter: Payer: Self-pay | Admitting: Family Medicine

## 2012-07-11 ENCOUNTER — Telehealth: Payer: Self-pay | Admitting: *Deleted

## 2012-07-11 VITALS — BP 163/93 | HR 86 | Ht 69.0 in | Wt 206.9 lb

## 2012-07-11 DIAGNOSIS — M542 Cervicalgia: Secondary | ICD-10-CM

## 2012-07-11 MED ORDER — HYDROCHLOROTHIAZIDE 25 MG PO TABS: 25.0000 mg | ORAL_TABLET | Freq: Every day | ORAL | Status: DC

## 2012-07-11 MED ORDER — MELOXICAM 15 MG PO TABS: 15.0000 mg | ORAL_TABLET | Freq: Every day | ORAL | Status: DC

## 2012-07-11 MED ORDER — CYCLOBENZAPRINE HCL 10 MG PO TABS: 10.0000 mg | ORAL_TABLET | Freq: Two times a day (BID) | ORAL | Status: DC | PRN

## 2012-07-11 NOTE — Telephone Encounter (Signed)
Pt states " I have this pain in my neck that is going up into my head and nothing is helping. I need to see somebody" Appointment made for SDA. Wyatt Haste, RN-BSN

## 2012-07-11 NOTE — Patient Instructions (Addendum)
Take the muscle relaxants (Flexeril) once a day as needed  Use warm compresses on your neck shoulders  Take meloxicam once a day with food as needed for pain   Follow-up with Dr. Gwenlyn Saran in 1-2 weeks to discuss your blood pressure and to follow-up o your neck pain  Bring your medicines please

## 2012-07-11 NOTE — Progress Notes (Signed)
  Subjective:    Patient ID: Tyler Lester, male    DOB: [birth year], 67 y.o.   MRN: [id number]  HPI # SDA Neck pain for the past 2 weeks It feels like a knot on the right side of his neck Denies inciting event  No history of pat neck injuries Denies tingling, numbness of hands  Medications tried: -Gabapentin: did not help; he takes twice a day   -Icy hot helps the pain "just a little" -He is out of Flexeril   Review of Systems Denies headaches, chest pain, difficulty breathing   Allergies, medication, past medical history reviewed.  Smoking status noted. Hypertension--he is not sure which medications he is on     Objective:   Physical Exam GEN: NAD; well-nourished, -appearing HEENT:   Head: [place]/AT   Eyes: normal conjunctiva without injection or tearing   Nose: no rhinorrhea, normal turbinates NECK: no LAD   Inspection: No muscle wasting or winging    Ecchymosis/edema: neg    AC joint, scapula, clavicle: NT   Cervical spine: tender along trapezius on right (none on left); with ROM 120 deg on right and 120 on left (hurts to move both ways but pain is localized to right side of neck)   Spurling's: positive bilaterally but may be secondary to just generalized pain with ROM   C5-T1 intact   Sensation intact   Grip 5/5 CV: RRR PULM: NI WOB; CTAB   10/2010 CERVICAL SPINE - COMPLETE 4+ VIEW  Comparison: 05/22/2007  Findings: Stable appearance of moderately severe spondylosis at the  C5-6 level consisting of disc space narrowing, sclerosis and  proliferative changes. This level demonstrates bilateral bony  foraminal stenosis. Other levels show milder proliferative  changes. No evidence of fracture or subluxation. No soft tissue  swelling.  IMPRESSION:  Stable cervical spondylosis with most significant disease at C5-6.     Assessment & Plan:

## 2012-07-14 DIAGNOSIS — M542 Cervicalgia: Secondary | ICD-10-CM | POA: Insufficient documentation

## 2012-07-14 NOTE — Assessment & Plan Note (Signed)
For the past 2 weeks. Musculoskeletal +/- exacerbation of cervical spondylosis.  We discussed how cervical spondylosis predisposes him to occasional exacerbations.  -Flexeril prn -Warm compresses -Follow-up prn; discussed worrisome red flags that should prompt follow-up (worsening pain, numbness in hands, other worrisome symptoms)

## 2012-07-30 ENCOUNTER — Ambulatory Visit: Payer: PRIVATE HEALTH INSURANCE | Admitting: Family Medicine

## 2012-08-04 ENCOUNTER — Other Ambulatory Visit: Payer: Self-pay | Admitting: Family Medicine

## 2012-08-04 NOTE — Telephone Encounter (Signed)
Sent refill for hydrocodone/APAP 7.5/325 q6/prn 60 tablets, no refills. Sent to [facility].   Marena Chancy, PGY-2 Family Medicine Resident

## 2012-08-13 ENCOUNTER — Encounter: Payer: Self-pay | Admitting: Family Medicine

## 2012-08-13 ENCOUNTER — Ambulatory Visit (INDEPENDENT_AMBULATORY_CARE_PROVIDER_SITE_OTHER): Payer: PRIVATE HEALTH INSURANCE | Admitting: Family Medicine

## 2012-08-13 VITALS — BP 122/75 | HR 94 | Ht 69.0 in | Wt 206.7 lb

## 2012-08-13 DIAGNOSIS — I10 Essential (primary) hypertension: Secondary | ICD-10-CM

## 2012-08-13 DIAGNOSIS — E1149 Type 2 diabetes mellitus with other diabetic neurological complication: Secondary | ICD-10-CM

## 2012-08-13 DIAGNOSIS — G609 Hereditary and idiopathic neuropathy, unspecified: Secondary | ICD-10-CM

## 2012-08-13 DIAGNOSIS — R609 Edema, unspecified: Secondary | ICD-10-CM

## 2012-08-13 DIAGNOSIS — E1142 Type 2 diabetes mellitus with diabetic polyneuropathy: Secondary | ICD-10-CM

## 2012-08-13 DIAGNOSIS — G629 Polyneuropathy, unspecified: Secondary | ICD-10-CM

## 2012-08-13 DIAGNOSIS — R600 Localized edema: Secondary | ICD-10-CM

## 2012-08-13 DIAGNOSIS — G8929 Other chronic pain: Secondary | ICD-10-CM

## 2012-08-13 MED ORDER — HYDROCODONE-ACETAMINOPHEN 7.5-325 MG PO TABS: 1.0000 | ORAL_TABLET | Freq: Three times a day (TID) | ORAL | Status: DC | PRN

## 2012-08-13 MED ORDER — GLIMEPIRIDE 4 MG PO TABS: 4.0000 mg | ORAL_TABLET | Freq: Every day | ORAL | Status: DC

## 2012-08-13 MED ORDER — METFORMIN HCL 1000 MG PO TABS: 1000.0000 mg | ORAL_TABLET | Freq: Two times a day (BID) | ORAL | Status: DC

## 2012-08-13 MED ORDER — GABAPENTIN 300 MG PO CAPS: 300.0000 mg | ORAL_CAPSULE | Freq: Three times a day (TID) | ORAL | Status: DC

## 2012-08-13 MED ORDER — LISINOPRIL 10 MG PO TABS: 10.0000 mg | ORAL_TABLET | Freq: Every day | ORAL | Status: DC

## 2012-08-13 MED ORDER — METOPROLOL SUCCINATE ER 50 MG PO TB24: 50.0000 mg | ORAL_TABLET | Freq: Every day | ORAL | Status: DC

## 2012-08-13 MED ORDER — FUROSEMIDE 20 MG PO TABS: 20.0000 mg | ORAL_TABLET | Freq: Every day | ORAL | Status: DC

## 2012-08-13 NOTE — Patient Instructions (Signed)
Please make an appointment for the geriatric clinic on any Thursday morning of the month of July.  Please bring all your medicines with you for that appointment.

## 2012-08-17 NOTE — Assessment & Plan Note (Addendum)
Refilled vicodin 7.5/325 tid prn 90 tabs.

## 2012-08-17 NOTE — Assessment & Plan Note (Signed)
Well controled on hctz 15, metop 50. Continue current management.

## 2012-08-17 NOTE — Assessment & Plan Note (Signed)
Compliant with metformin and amaryl. Foot changes with caluces and thickened nails. Would benefit from geriatric clinic for better foot care. PAtient to make appointment for July.  At that visit, will get a1c.

## 2012-08-17 NOTE — Progress Notes (Signed)
Patient ID: Tyler Lester    DOB: [birth year], 67 y.o.   MRN: [id number] --- Subjective:  Tyler Lester is a 67 y.o.male who presents for follow up on hypertension. - hypertension: taking medications. No chest pain, no shortness of breath. Used to have lower extremity swelling which has well improved. No headache or change in vision.  He takes hctz, metoprolol and lasix.    ROS: see HPI Past Medical History: reviewed and updated medications and allergies. Recent surgery: cataract surgery in left eye which went well.  Social History: Tobacco: none  Objective: Filed Vitals:   08/13/12 1623  BP: 122/75  Pulse: 94    Physical Examination:   General appearance - alert, well appearing, and in no distress Neck - supple, no significant adenopathy Chest - clear to auscultation, no wheezes, rales or rhonchi, symmetric air entry Heart - normal rate, regular rhythm, normal S1, S2, no murmurs, rubs, clicks or gallops Abdomen - soft, nontender, nondistended, no masses or organomegaly Extremities - trace edema bilaterally, Feet: calices present on heels and bilaterally, thickened nails present.

## 2012-09-04 ENCOUNTER — Ambulatory Visit (INDEPENDENT_AMBULATORY_CARE_PROVIDER_SITE_OTHER): Payer: PRIVATE HEALTH INSURANCE | Admitting: Family Medicine

## 2012-09-04 ENCOUNTER — Encounter: Payer: Self-pay | Admitting: Family Medicine

## 2012-09-04 VITALS — BP 102/68 | HR 85 | Ht 69.0 in | Wt 203.6 lb

## 2012-09-04 DIAGNOSIS — L603 Nail dystrophy: Secondary | ICD-10-CM

## 2012-09-04 DIAGNOSIS — R339 Retention of urine, unspecified: Secondary | ICD-10-CM

## 2012-09-04 DIAGNOSIS — E1142 Type 2 diabetes mellitus with diabetic polyneuropathy: Secondary | ICD-10-CM

## 2012-09-04 DIAGNOSIS — Z23 Encounter for immunization: Secondary | ICD-10-CM

## 2012-09-04 DIAGNOSIS — E1149 Type 2 diabetes mellitus with other diabetic neurological complication: Secondary | ICD-10-CM

## 2012-09-04 DIAGNOSIS — L608 Other nail disorders: Secondary | ICD-10-CM

## 2012-09-04 DIAGNOSIS — I10 Essential (primary) hypertension: Secondary | ICD-10-CM

## 2012-09-04 DIAGNOSIS — K219 Gastro-esophageal reflux disease without esophagitis: Secondary | ICD-10-CM

## 2012-09-04 LAB — POCT GLYCOSYLATED HEMOGLOBIN (HGB A1C): Hemoglobin A1C: 6.5

## 2012-09-04 NOTE — Assessment & Plan Note (Addendum)
Diabetic foot exam done in clinic today.  A1C of 6.5.  - Continue amaryl 4mg  daily and metformin 1000mg  bid.  - foot care: podiatry appointment to be made. Recommendation of bag balm for dry and cracked skin of feet - Patient to make office appointment for 2 weeks from now. At that time, will obtain fasting lipid panel and CMP.

## 2012-09-04 NOTE — Assessment & Plan Note (Signed)
well controlled  

## 2012-09-04 NOTE — Progress Notes (Signed)
Patient ID: Tyler Lester    DOB: August 11, 1945, 67 y.o.   MRN: [id number] --- Subjective:  Tyler Lester is a 67 y.o.male with h/o DM2, HTN, chronic neck and hip pain who presents for geriatric clinic evaluation.  (Note: Patient arrived over one hour late due to transportation issues, limiting visit to a couple of items)  - patient referred by PCP for foot evaluation in context of DM2. Patient with thickened nails and dry skin for several months. No pain, no loss of sensation, no numbness or tingling in lower extremities.   ROS: see HPI Past Medical History: reviewed and updated medications and allergies. Social History: Tobacco: none  Objective: Filed Vitals:   09/04/12 1158  BP: 102/68  Pulse: 85    Physical Examination:   General appearance - alert, well appearing, and in no distress Chest - clear to auscultation, no wheezes, rales or rhonchi, symmetric air entry Heart - normal rate, regular rhythm, normal S1, S2, no murmurs Foot exam - long, dystrophic, thickened nails in bilateral feet Thickened, dry and cracked skin on bilateral heels.  Normal sensation to microfilament Dorsalis pedis pulse difficult to palpate bilaterally  Diabetes foot exam documented in chart.

## 2012-09-04 NOTE — Assessment & Plan Note (Signed)
Dystrophy from onychomycosis. Trimmed nails during visit.  Patient's diabetes is well controled and he doesn't have loss of sensation or active foot wounds, making treatment with oral terbinafine not necessary at this time.  Will refer patient to podiatry for regular foot exams and foot care.

## 2012-09-04 NOTE — Patient Instructions (Addendum)
Follow up with Dr. Gwenlyn Saran in 2 weeks. Come fasting for the appointment so that we can check lab work. This means no food or drink other than water after midnight.  For the dry skin, use bag balm.

## 2012-09-06 ENCOUNTER — Encounter (HOSPITAL_COMMUNITY): Payer: Self-pay | Admitting: *Deleted

## 2012-09-06 ENCOUNTER — Emergency Department (HOSPITAL_COMMUNITY)
Admission: EM | Admit: 2012-09-06 | Discharge: 2012-09-06 | Disposition: A | Discharge status: Home / Self Care | Payer: PRIVATE HEALTH INSURANCE | Attending: Emergency Medicine | Admitting: Emergency Medicine

## 2012-09-06 DIAGNOSIS — G4730 Sleep apnea, unspecified: Secondary | ICD-10-CM | POA: Insufficient documentation

## 2012-09-06 DIAGNOSIS — E119 Type 2 diabetes mellitus without complications: Secondary | ICD-10-CM | POA: Insufficient documentation

## 2012-09-06 DIAGNOSIS — R338 Other retention of urine: Secondary | ICD-10-CM | POA: Insufficient documentation

## 2012-09-06 DIAGNOSIS — G8929 Other chronic pain: Secondary | ICD-10-CM | POA: Insufficient documentation

## 2012-09-06 DIAGNOSIS — E785 Hyperlipidemia, unspecified: Secondary | ICD-10-CM | POA: Insufficient documentation

## 2012-09-06 DIAGNOSIS — R300 Dysuria: Secondary | ICD-10-CM | POA: Insufficient documentation

## 2012-09-06 DIAGNOSIS — I10 Essential (primary) hypertension: Secondary | ICD-10-CM | POA: Insufficient documentation

## 2012-09-06 DIAGNOSIS — R339 Retention of urine, unspecified: Secondary | ICD-10-CM

## 2012-09-06 DIAGNOSIS — Z79899 Other long term (current) drug therapy: Secondary | ICD-10-CM | POA: Insufficient documentation

## 2012-09-06 LAB — URINALYSIS, ROUTINE W REFLEX MICROSCOPIC
Bilirubin Urine: NEGATIVE
Glucose, UA: NEGATIVE mg/dL
Ketones, ur: NEGATIVE mg/dL
Leukocytes, UA: NEGATIVE
Nitrite: NEGATIVE
Protein, ur: NEGATIVE mg/dL
Specific Gravity, Urine: 1.01 (ref 1.005–1.030)
Urobilinogen, UA: 0.2 mg/dL (ref 0.0–1.0)
pH: 5.5 (ref 5.0–8.0)

## 2012-09-06 LAB — URINE MICROSCOPIC-ADD ON

## 2012-09-06 LAB — GLUCOSE, CAPILLARY: Glucose-Capillary: 135 mg/dL — ABNORMAL HIGH (ref 70–99)

## 2012-09-06 NOTE — ED Notes (Signed)
Pt states feels the need to urinate but cannot pass but just a little urine and symptoms started this am.

## 2012-09-06 NOTE — ED Provider Notes (Signed)
History    CSN: [id number] Arrival date & time 09/06/12  1236  First MD Initiated Contact with Patient 09/06/12 1306     Chief Complaint  Patient presents with  . unable to urinate    (Consider location/radiation/quality/duration/timing/severity/associated sxs/prior Treatment) HPI Comments: Patient presents with a one-day history of inability to urinate. He does have a history of prostate problems and had a history of urinary retention in the past which required Foley catheter. He states that he was feeling okay yesterday but today he noticed that he was only dribbling a little bit when he was trying to pee. He has worsening pressure in his lower abdomen. He's had new vomiting or fevers. He is followed by a [facility].  Past Medical History  Diagnosis Date  . Diabetes mellitus   . Chronic pain   . Hypertension   . Hyperlipidemia   . Sleep apnea    Past Surgical History  Procedure Laterality Date  . Colon surgery    . Cataract extraction w/ intraocular lens implant Left 06/2012    laser for posterior capsule?   No family history on file. History  Substance Use Topics  . Smoking status: Never Smoker   . Smokeless tobacco: Not on file  . Alcohol Use: No    Review of Systems  Constitutional: Negative for fever, chills, diaphoresis and fatigue.  HENT: Negative for congestion, rhinorrhea and sneezing.   Eyes: Negative.   Respiratory: Negative for cough, chest tightness and shortness of breath.   Cardiovascular: Negative for chest pain and leg swelling.  Gastrointestinal: Negative for nausea, vomiting, abdominal pain, diarrhea and blood in stool.  Genitourinary: Positive for dysuria. Negative for frequency, hematuria, flank pain, scrotal swelling, difficulty urinating and testicular pain.  Musculoskeletal: Negative for back pain and arthralgias.  Skin: Negative for rash.  Neurological: Negative for dizziness, speech difficulty, weakness, numbness and headaches.     Allergies  Review of patient's allergies indicates no known allergies.  Home Medications   Current Outpatient Rx  Name  Route  Sig  Dispense  Refill  . Cholecalciferol (VITAMIN D3) 400 UNITS tablet   Oral   Take 2 tablets (800 Units total) by mouth daily.   60 tablet   1   . clonazePAM (KLONOPIN) 0.5 MG tablet   Oral   Take 0.5 mg by mouth 2 (two) times daily as needed. For anxiety         . cyclobenzaprine (FLEXERIL) 10 MG tablet   Oral   Take 1 tablet (10 mg total) by mouth 2 (two) times daily as needed for muscle spasms.   60 tablet   0   . furosemide (LASIX) 20 MG tablet   Oral   Take 1 tablet (20 mg total) by mouth daily. Take 1 tablet twice a day for the next 5 days then use once per day   30 tablet   3   . gabapentin (NEURONTIN) 300 MG capsule   Oral   Take 1 capsule (300 mg total) by mouth 3 (three) times daily.   90 capsule   3   . glimepiride (AMARYL) 4 MG tablet   Oral   Take 1 tablet (4 mg total) by mouth daily.   30 tablet   6   . hydrochlorothiazide (HYDRODIURIL) 25 MG tablet   Oral   Take 1 tablet (25 mg total) by mouth daily.   30 tablet   4   . HYDROcodone-acetaminophen (NORCO) 7.5-325 MG per tablet   Oral  Take 1 tablet by mouth 3 (three) times daily as needed for pain.   90 tablet   1   . ketoconazole (NIZORAL) 2 % cream   Topical   Apply 1 application topically daily.         Marland Kitchen lisinopril (PRINIVIL,ZESTRIL) 10 MG tablet   Oral   Take 1 tablet (10 mg total) by mouth daily.   90 tablet   3   . metFORMIN (GLUCOPHAGE) 1000 MG tablet   Oral   Take 1 tablet (1,000 mg total) by mouth 2 (two) times daily with a meal.   60 tablet   6   . metoprolol succinate (TOPROL-XL) 50 MG 24 hr tablet   Oral   Take 1 tablet (50 mg total) by mouth daily. Take with or immediately following a meal.   30 tablet   4   . pantoprazole (PROTONIX) 40 MG tablet   Oral   Take 40 mg by mouth daily.         . tamsulosin (FLOMAX) 0.4 MG  CAPS   Oral   Take 1 capsule (0.4 mg total) by mouth daily.   30 capsule   1    BP 158/81  Pulse 95  Temp(Src) 98 F (36.7 C) (Oral)  Resp 18  SpO2 99% Physical Exam  Constitutional: He is oriented to person, place, and time. He appears well-developed and well-nourished.  HENT:  Head: Normocephalic and atraumatic.  Eyes: Pupils are equal, round, and reactive to light.  Neck: Normal range of motion. Neck supple.  Cardiovascular: Normal rate, regular rhythm and normal heart sounds.   Pulmonary/Chest: Effort normal and breath sounds normal. No respiratory distress. He has no wheezes. He has no rales. He exhibits no tenderness.  Abdominal: Soft. Bowel sounds are normal. There is no tenderness. There is no rebound and no guarding.  Musculoskeletal: Normal range of motion. He exhibits no edema.  Lymphadenopathy:    He has no cervical adenopathy.  Neurological: He is alert and oriented to person, place, and time.  Skin: Skin is warm and dry. No rash noted.  Psychiatric: He has a normal mood and affect.    ED Course  Procedures (including critical care time) Results for orders placed during the hospital encounter of 09/06/12  URINALYSIS, ROUTINE W REFLEX MICROSCOPIC      Result Value Range   Color, Urine YELLOW  YELLOW   APPearance CLEAR  CLEAR   Specific Gravity, Urine 1.010  1.005 - 1.030   pH 5.5  5.0 - 8.0   Glucose, UA NEGATIVE  NEGATIVE mg/dL   Hgb urine dipstick SMALL (*) NEGATIVE   Bilirubin Urine NEGATIVE  NEGATIVE   Ketones, ur NEGATIVE  NEGATIVE mg/dL   Protein, ur NEGATIVE  NEGATIVE mg/dL   Urobilinogen, UA 0.2  0.0 - 1.0 mg/dL   Nitrite NEGATIVE  NEGATIVE   Leukocytes, UA NEGATIVE  NEGATIVE  GLUCOSE, CAPILLARY      Result Value Range   Glucose-Capillary 135 (*) 70 - 99 mg/dL   Comment 1 Documented in Chart     Comment 2 Notify RN    URINE MICROSCOPIC-ADD ON      Result Value Range   RBC / HPF 3-6  <3 RBC/hpf   No results found.   No results found. 1.  Urinary retention     MDM  A Foley catheter was placed and the patient had about 1000 cc of clear urine return. The catheter was left in place with a leg bag  and patient will be discharged to followup with his urologist. He is currently already on Flomax.  Rolan Bucco, MD 09/06/12 1510

## 2012-09-06 NOTE — ED Notes (Signed)
Pt was unable to urinate. Orders for foley cath given. Pt with 700 ml urine out.

## 2012-09-06 NOTE — ED Notes (Signed)
CBG was 135. Notified Nurse Martie Lee.

## 2012-09-06 NOTE — Discharge Instructions (Signed)
Acute Urinary Retention, Male °You have been seen by a caregiver today because of your inability to urinate (pass your water). °This is a common problem in elderly males. As men age their prostates become larger and block the flow of urine from the bladder. This is usually a problem that has come on gradually. It is often first noticed by having to get up at night to urinate. This is because as the prostate enlarges it is more difficult to empty the bladder completely. °Treatment may involve a one time catheterization to empty the bladder. This is putting in a tube to drain your urine. Then you and your personal caregiver can decide at your earliest convenience how to handle this problem in the future. It may also be a problem that may not recur for years. Sometimes this problem can be caused by medications. In this case, all that is often necessary is to discontinue the offending agent. °If you are to leave the foley catheter (a long, narrow, hollow tube) in and go home with a drainage system, you will need to discuss the best course of action with your caregiver. While the catheter is in, maintain a good intake of fluids. Keep the drainage bag emptied and lower than your catheter. This is so contaminated (infected) urine will not be flowing back into your bladder. This could lead to a urinary tract infection. °Only take over-the-counter or prescription medicines for pain, discomfort, or fever as directed by your caregiver.  °SEEK IMMEDIATE MEDICAL CARE IF:  °You develop chills, fever, or show signs of generalized illness that occurs prior to seeing your caregiver. °Document Released: 05/21/2000 Document Revised: 05/07/2011 Document Reviewed: 02/04/2008 °Exit[employer]® Patient Information ©2014 [employer]. ° °

## 2012-09-06 NOTE — ED Notes (Signed)
Foley bag changed to a leg bag. Education done on how to change the bag at night to the large foley bag to avoid infection. Pt getting dressed at this time.

## 2012-09-06 NOTE — ED Notes (Signed)
Pt given a urinal.

## 2012-09-11 ENCOUNTER — Ambulatory Visit: Payer: PRIVATE HEALTH INSURANCE

## 2012-09-19 ENCOUNTER — Ambulatory Visit: Payer: PRIVATE HEALTH INSURANCE | Admitting: Family Medicine

## 2012-10-06 ENCOUNTER — Telehealth: Payer: Self-pay | Admitting: Family Medicine

## 2012-10-07 NOTE — Telephone Encounter (Signed)
error 

## 2012-10-14 ENCOUNTER — Ambulatory Visit (INDEPENDENT_AMBULATORY_CARE_PROVIDER_SITE_OTHER): Payer: PRIVATE HEALTH INSURANCE | Admitting: Family Medicine

## 2012-10-14 ENCOUNTER — Encounter: Payer: Self-pay | Admitting: Family Medicine

## 2012-10-14 VITALS — BP 129/76 | HR 84 | Ht 69.0 in | Wt 201.0 lb

## 2012-10-14 DIAGNOSIS — E1149 Type 2 diabetes mellitus with other diabetic neurological complication: Secondary | ICD-10-CM

## 2012-10-14 DIAGNOSIS — G8929 Other chronic pain: Secondary | ICD-10-CM

## 2012-10-14 DIAGNOSIS — E119 Type 2 diabetes mellitus without complications: Secondary | ICD-10-CM

## 2012-10-14 MED ORDER — METFORMIN HCL 1000 MG PO TABS: 1000.0000 mg | ORAL_TABLET | Freq: Two times a day (BID) | ORAL | Status: DC

## 2012-10-14 MED ORDER — HYDROCODONE-ACETAMINOPHEN 7.5-325 MG PO TABS: 1.0000 | ORAL_TABLET | Freq: Three times a day (TID) | ORAL | Status: DC | PRN

## 2012-10-14 MED ORDER — CYCLOBENZAPRINE HCL 10 MG PO TABS: 10.0000 mg | ORAL_TABLET | Freq: Two times a day (BID) | ORAL | Status: DC | PRN

## 2012-10-14 MED ORDER — GLIMEPIRIDE 4 MG PO TABS: 4.0000 mg | ORAL_TABLET | Freq: Every day | ORAL | Status: DC

## 2012-10-14 NOTE — Patient Instructions (Addendum)
Follow up in October  Come back for a lab visit to get your cholesterol checked. Call the clinic 2 days before the time you would like to come. Please come fasting: no food or drink other than water after midnight.

## 2012-10-16 NOTE — Assessment & Plan Note (Signed)
Recent A1C in July of 6.5. Continue metformin and amaryl.  Instructed patient to return in the near future for fasting lipid panel. Will also get BMP.
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
## 2012-10-16 NOTE — Assessment & Plan Note (Signed)
Refilled vicodin 7.5/325 90 tablets 1 refill.  Refilled flexeril even though not ideal in older patient. Continue to monitor.

## 2012-10-21 ENCOUNTER — Ambulatory Visit: Payer: PRIVATE HEALTH INSURANCE | Admitting: Family Medicine

## 2012-10-22 ENCOUNTER — Telehealth: Payer: Self-pay | Admitting: Family Medicine

## 2012-10-22 NOTE — Telephone Encounter (Signed)
Please see note below. Elizabeth Skyelar Swigart, RN-BSN  

## 2012-10-22 NOTE — Telephone Encounter (Signed)
Pt wants a note to get a back brace and cane so he can get it on his insurance Please advise

## 2012-10-28 MED ORDER — CANE MISC: 1.0000 | Freq: Every day | Status: DC

## 2012-10-28 MED ORDER — LUMBAR BACK BRACE/SUPPORT PAD MISC: 1.0000 | Freq: Every day | Status: DC

## 2012-10-28 NOTE — Telephone Encounter (Signed)
Attempted to call both numbers - unavailable at this time Wyatt Haste, RN-BSN

## 2012-10-28 NOTE — Telephone Encounter (Signed)
rx printed for cane and back brace. Please call patient to let him know he can pick prescriptions up. Will drop off at front desk for him to pick up.   Marena Chancy, PGY-3 Family Medicine Resident

## 2012-11-25 ENCOUNTER — Encounter: Payer: Self-pay | Admitting: Family Medicine

## 2012-12-11 ENCOUNTER — Encounter: Payer: Self-pay | Admitting: Family Medicine

## 2012-12-11 ENCOUNTER — Ambulatory Visit (INDEPENDENT_AMBULATORY_CARE_PROVIDER_SITE_OTHER): Payer: PRIVATE HEALTH INSURANCE | Admitting: Family Medicine

## 2012-12-11 VITALS — BP 173/92 | HR 80 | Temp 98.0°F | Ht 69.0 in | Wt 208.0 lb

## 2012-12-11 DIAGNOSIS — I10 Essential (primary) hypertension: Secondary | ICD-10-CM

## 2012-12-11 DIAGNOSIS — M25561 Pain in right knee: Secondary | ICD-10-CM

## 2012-12-11 DIAGNOSIS — E1142 Type 2 diabetes mellitus with diabetic polyneuropathy: Secondary | ICD-10-CM

## 2012-12-11 DIAGNOSIS — M25569 Pain in unspecified knee: Secondary | ICD-10-CM

## 2012-12-11 DIAGNOSIS — Z23 Encounter for immunization: Secondary | ICD-10-CM

## 2012-12-11 DIAGNOSIS — E1149 Type 2 diabetes mellitus with other diabetic neurological complication: Secondary | ICD-10-CM

## 2012-12-11 LAB — POCT GLYCOSYLATED HEMOGLOBIN (HGB A1C): Hemoglobin A1C: 6.1

## 2012-12-11 MED ORDER — HYDROCHLOROTHIAZIDE 25 MG PO TABS: 25.0000 mg | ORAL_TABLET | Freq: Every day | ORAL | Status: DC

## 2012-12-11 MED ORDER — HYDROCODONE-ACETAMINOPHEN 7.5-325 MG PO TABS: 1.0000 | ORAL_TABLET | Freq: Three times a day (TID) | ORAL | Status: DC | PRN

## 2012-12-11 NOTE — Patient Instructions (Signed)
For your knee, do the exercises like I showed you.  - wall squat for 20-30 seconds - leg raises with the elastic band: 15 reps for 3 sets Stop if it causes any pain.   I am sending medicine for the blood pressure  Follow up in 3 months.

## 2012-12-12 DIAGNOSIS — M25561 Pain in right knee: Secondary | ICD-10-CM | POA: Insufficient documentation

## 2012-12-12 NOTE — Assessment & Plan Note (Addendum)
Uncontrolled today. Suspect this is because he has not been taking hydrochlorothiazide. Will admit hydrochlorothiazide 25 mg to his current lisinopril and metoprolol. Repeat basic metabolic panel and lipid panel in next visit.

## 2012-12-12 NOTE — Assessment & Plan Note (Signed)
Suspect osteoarthritis as cause. Consider obtaining knee x-rays at future visit to see how much osteoarthritis present. Discussed possibility of joint injection. Patient is not quite ready for this at this time. Continue pain medicine for now.

## 2012-12-12 NOTE — Assessment & Plan Note (Signed)
Well controlled with hemoglobin A1c of 6.1. Continue glyburide and metformin.

## 2012-12-12 NOTE — Progress Notes (Signed)
Patient ID: Tyler Lester    DOB: 1945/11/10, 67 y.o.   MRN: [id number] --- Subjective:  Tyler Lester is a 67 y.o.male who presents for followup. -Diabetes: Patient has been taking metformin and glimepiride. Denies any recent hypoglycemic episodes. He was seen recently by a podiatrist who prescribed medicine for nail fungus. He feels like it has improved. Denies any lower extremity open lesions or sores. Denies  Anemia of change in vision. He has scheduled surgery for cataract in January.  - Hypertension: He has been taking lisinopril 10 mg daily as well as metoprolol. He has not been taking the hydrochlorothiazide. He thought this had been replaced by lisinopril. He denies any chest pain, any shortness of breath, any worsening lower extremity swelling.  - Bilateral knee pain: Left more than right. Located on the medial aspect of knee. A few days ago had the feeling that the left knee was going to give out. Pain is intermittent, worse with walking. He has been able to walk around and be functional. He has been taking Vicodin 7.5/ 325 once to twice a day.  ROS: see HPI Past Medical History: reviewed and updated medications and allergies. Social History: Tobacco: None  Objective: Filed Vitals:   12/11/12 1603  BP: 173/92  Pulse: 80  Temp: 98 F (36.7 C)    Physical Examination:   General appearance - alert, well appearing, and in no distress Neck - supple, no significant adenopathy Chest - clear to auscultation, no wheezes, rales or rhonchi, symmetric air entry Heart - normal rate, regular rhythm, normal S1, S2, no murmurs Abdomen - soft, nontender Extremities - trace pitting pedal edema Knees-left knee: No significant soft tissue swelling or joint effusion. Tenderness to palpation along medial and lateral aspect of patella. Unable to fully extend. Normal flexion.  Right knee: No soft tissue swelling or joint effusion. Mild tenderness to palpation along medial and lateral joint lines.

## 2013-01-07 ENCOUNTER — Telehealth: Payer: Self-pay | Admitting: Family Medicine

## 2013-01-07 NOTE — Telephone Encounter (Signed)
Pt called and would like a refill of his hydrocodone left up front for pick up. He would also like to ne called when it is ready. JW

## 2013-01-07 NOTE — Telephone Encounter (Signed)
Will fwd to MD.  Kinlee Garrison L, CMA  

## 2013-01-08 ENCOUNTER — Telehealth: Payer: Self-pay | Admitting: Family Medicine

## 2013-01-08 NOTE — Telephone Encounter (Signed)
Pt called again about refilling his pain meds.

## 2013-01-08 NOTE — Telephone Encounter (Signed)
Pt called and wanted to know what the refill status is on his hydrocodone. JW

## 2013-01-09 ENCOUNTER — Ambulatory Visit (INDEPENDENT_AMBULATORY_CARE_PROVIDER_SITE_OTHER): Payer: PRIVATE HEALTH INSURANCE | Admitting: Family Medicine

## 2013-01-09 ENCOUNTER — Encounter: Payer: Self-pay | Admitting: Family Medicine

## 2013-01-09 VITALS — BP 154/98 | HR 79 | Temp 98.3°F | Wt 206.0 lb

## 2013-01-09 DIAGNOSIS — M25569 Pain in unspecified knee: Secondary | ICD-10-CM

## 2013-01-09 DIAGNOSIS — M25561 Pain in right knee: Secondary | ICD-10-CM

## 2013-01-09 MED ORDER — HYDROCODONE-ACETAMINOPHEN 7.5-325 MG PO TABS: 1.0000 | ORAL_TABLET | Freq: Three times a day (TID) | ORAL | Status: DC | PRN

## 2013-01-09 NOTE — Telephone Encounter (Signed)
Pt seen by me today, 11/14. Norco refilled for two weeks only, with f/u with Dr. Gwenlyn Saran scheduled for 11/26. See SDA note by me from today. I forwarded that note to Dr. Gwenlyn Saran, as well. Thanks. --CMS

## 2013-01-09 NOTE — Telephone Encounter (Addendum)
Pt seen by me today, 11/14. Norco refilled for two weeks only, with f/u with Dr. Gwenlyn Saran scheduled for 11/26. See SDA note by me from today. I forwarded that note to Dr. Gwenlyn Saran, as well. Thanks. --CMS  Addendum: corrected date.

## 2013-01-09 NOTE — Patient Instructions (Signed)
Thank you for coming in, today!  I think your pain is related to your arthritis. I don't see anything that makes me worried about infection or fracture. Continue to work on stretching and exercise, gently. I will refill your pain medicine for 2 weeks. Make an appointment today to see Dr. Gwenlyn Saran between now and when your medicine runs out. I will send her a message to let her know that you will be seeing her soon.  Please feel free to call with any questions or concerns at any time, at [phone]. --Dr. Casper Harrison

## 2013-01-09 NOTE — Progress Notes (Signed)
  Subjective:    Patient ID: Tyler Lester, male    DOB: [birth year], 67 y.o.   MRN: [id number]  HPI: Pt presents to [facility] for SDA for right knee pain. Pt was seen about 1 month ago with similar pain, but was worse on the left at that time. Current pain has been present about 1 week. Pain is described as sharp in and around the knee, worst below the kneecap around a 'bump' that has been there "about all his life." Pain is constant, 10/10 at its worst. Moving around makes it worse. Dr. Gwenlyn Saran at pt's visit last monthconsidered that osteoarthritis may be contributing. Pt has not had xrays in about 2 years.  Of note, pt stated at beginning of appointment: "I need my pain medicine refilled, nobody'll give it to me. The pharmacy won't fill it, I guess I need a new prescription." Pt states he has about 6 hydrocodone left (90 were filled last month). Pt states he has been using this for about 2 years.   Review of Systems: As above. No fever/chills, N/V. Denies swelling in either knee. Does endorse pain in low back and right hip. Denies sciatica-type symptoms.     Objective:   Physical Exam BP 154/98  Pulse 79  Temp(Src) 98.3 F (36.8 C) (Oral)  Wt 206 lb (93.441 kg)  Gen: well-appearing adult male in NAD; does fixate on pain medication, in conversation MSK: bilateral knees with pain on active and passive ROM  Crepitus under patella bilaterally with compression on passive ROM  Tenderness to palpation along medial and lateral joint lines on both knees, worst on lateral right  Increased pain with valgus and varus stress bilaterally, but right > left  Negative drawer signs bilaterally  No joint effusion noted to either knee, sitting straight leg lift negative for radicular pain bilaterally Ext/Skin: bilateral LE with skin changes consistent with chronic venous insufficiency (scaling, thickened skin) but no breakdown or ulceration, and minimal swelling     Assessment & Plan:

## 2013-01-09 NOTE — Assessment & Plan Note (Signed)
Bilateral knee pain consistent with prior exams, very likely component of osteoarthritis. Dr. Gwenlyn Saran considered xrays at appointment last month, which would likely be helpful (last done in 2012, showed "mild bilateral osteoarthritis") to evaluate for worsening degeneration. Pt prescribed Norco #90 about one month ago. Given use TID, refill today seems appropriate, but will only write for #45 (two weeks worth). Instructed pt to f/u with Dr. Gwenlyn Saran within the next two weeks for further refills and to consider xrays. Will also forward this note to her and address phone notes from yesterday.

## 2013-01-21 ENCOUNTER — Ambulatory Visit (INDEPENDENT_AMBULATORY_CARE_PROVIDER_SITE_OTHER): Payer: PRIVATE HEALTH INSURANCE | Admitting: Family Medicine

## 2013-01-21 ENCOUNTER — Encounter: Payer: Self-pay | Admitting: Family Medicine

## 2013-01-21 VITALS — BP 170/95 | HR 85 | Ht 69.0 in | Wt 206.9 lb

## 2013-01-21 DIAGNOSIS — I10 Essential (primary) hypertension: Secondary | ICD-10-CM

## 2013-01-21 DIAGNOSIS — G8929 Other chronic pain: Secondary | ICD-10-CM

## 2013-01-21 MED ORDER — LISINOPRIL 10 MG PO TABS: 10.0000 mg | ORAL_TABLET | Freq: Every day | ORAL | Status: DC

## 2013-01-21 MED ORDER — HYDROCODONE-ACETAMINOPHEN 7.5-325 MG PO TABS: 1.0000 | ORAL_TABLET | Freq: Three times a day (TID) | ORAL | Status: DC | PRN

## 2013-01-21 MED ORDER — HYDROCHLOROTHIAZIDE 25 MG PO TABS: 25.0000 mg | ORAL_TABLET | Freq: Every day | ORAL | Status: DC

## 2013-01-21 MED ORDER — METOPROLOL SUCCINATE ER 50 MG PO TB24: 50.0000 mg | ORAL_TABLET | Freq: Every day | ORAL | Status: DC

## 2013-01-21 MED ORDER — CYCLOBENZAPRINE HCL 10 MG PO TABS: 10.0000 mg | ORAL_TABLET | Freq: Two times a day (BID) | ORAL | Status: DC | PRN

## 2013-01-21 MED ORDER — METFORMIN HCL 1000 MG PO TABS: 1000.0000 mg | ORAL_TABLET | Freq: Two times a day (BID) | ORAL | Status: DC

## 2013-01-21 NOTE — Patient Instructions (Signed)
Follow up in middle of January.   Please get the xrays of your knees whenever you can, either at [facility] or at the hospital.

## 2013-01-26 NOTE — Assessment & Plan Note (Signed)
Uncontrolled due to non-compliance with meds. Refilled meds. Encouraged taking meds on regular basis and calling in advance before meds run out.
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
## 2013-01-26 NOTE — Assessment & Plan Note (Signed)
Refill Vicodin for 1 month. Bilateral knee xrays ordered to evaluate extent of arthritis and possibility of knee injections.

## 2013-01-30 ENCOUNTER — Encounter: Payer: Self-pay | Admitting: Emergency Medicine

## 2013-01-30 ENCOUNTER — Ambulatory Visit (INDEPENDENT_AMBULATORY_CARE_PROVIDER_SITE_OTHER): Payer: PRIVATE HEALTH INSURANCE | Admitting: Emergency Medicine

## 2013-01-30 VITALS — BP 179/95 | HR 93 | Temp 98.1°F | Ht 69.0 in

## 2013-01-30 DIAGNOSIS — E1142 Type 2 diabetes mellitus with diabetic polyneuropathy: Secondary | ICD-10-CM

## 2013-01-30 DIAGNOSIS — E1149 Type 2 diabetes mellitus with other diabetic neurological complication: Secondary | ICD-10-CM

## 2013-01-30 DIAGNOSIS — R339 Retention of urine, unspecified: Secondary | ICD-10-CM

## 2013-01-30 DIAGNOSIS — R300 Dysuria: Secondary | ICD-10-CM

## 2013-01-30 LAB — GLUCOSE, CAPILLARY: Glucose-Capillary: 148 mg/dL — ABNORMAL HIGH (ref 70–99)

## 2013-01-30 MED ORDER — TAMSULOSIN HCL 0.4 MG PO CAPS: 0.8000 mg | ORAL_CAPSULE | Freq: Every day | ORAL | Status: DC

## 2013-01-30 MED ORDER — CEPHALEXIN 500 MG PO CAPS: 500.0000 mg | ORAL_CAPSULE | Freq: Three times a day (TID) | ORAL | Status: DC

## 2013-01-30 NOTE — Patient Instructions (Signed)
It was nice to see you!  I think you were retaining your urine with caused the pain. I am going to give you antibiotics for possible infection of your bladder.  Take 1 pill 3 times a day for 7 days.  Take Flomax 2 pills a day instead of 1 pill.  Follow up with your urologist as scheduled.   If you go 24 hours without urinating, you need to see a doctor.

## 2013-01-30 NOTE — Assessment & Plan Note (Addendum)
With dysuria and some blood in the urine on I&O cath. Urine noted on shorts on repeat exam. Unfortunately, sample was not collected for UA or culture. History of retention and elevated PSA, followed by Dr. Margarita Grizzle.   Pt states has appointment in January. As he was able to urinate in our office as evidenced by the wet shorts, will not place foley at this time. Increased flomax to 0.8mg  daily. With the dysuria and hematuria will presumptively treat for UTI with keflex 500mg  TID x7 days as no sample was collected.

## 2013-01-30 NOTE — Progress Notes (Signed)
   Subjective:    Patient ID: Tyler Lester, male    DOB: 26-Jul-1945, 67 y.o.   MRN: [id number]  HPI Sonya Pucci is here for a SDA for dysuria.  He states that this morning he was only able to urinate a small amount and it burned.  Also with some suprapubic pain.  No fevers.  Reported the urine looked normal this morning.  Urinating normally yesterday.  He is taking his flomax daily.  States he urinated on himself on the bus this morning, but his shorts were dry on initial exam and wet on f/u exam.  I have reviewed and updated the following as appropriate: allergies and current medications PMHx: BPH on flomax; elevated PSA; has had urinary retention in the past  SHx: non smoker   Review of Systems See HPI    Objective:   Physical Exam BP 179/95  Pulse 93  Temp(Src) 98.1 F (36.7 C) (Oral)  Ht 5\' 9"  (1.753 m) Gen: alert, cooperative, mild distress with exam Abd: +BS, soft, ND, tender mass in suprapubic area on initial exam --> resolved on repeat exam GU: normal penis, testes descended; no tenderness  I&O cath: small amount of bloody urine     Assessment & Plan:

## 2013-02-17 ENCOUNTER — Encounter: Payer: Self-pay | Admitting: Family Medicine

## 2013-02-17 ENCOUNTER — Ambulatory Visit (INDEPENDENT_AMBULATORY_CARE_PROVIDER_SITE_OTHER): Payer: PRIVATE HEALTH INSURANCE | Admitting: Family Medicine

## 2013-02-17 VITALS — BP 153/83 | HR 91 | Temp 98.3°F | Ht 69.0 in | Wt 207.0 lb

## 2013-02-17 DIAGNOSIS — R269 Unspecified abnormalities of gait and mobility: Secondary | ICD-10-CM

## 2013-02-17 DIAGNOSIS — R2681 Unsteadiness on feet: Secondary | ICD-10-CM

## 2013-02-17 DIAGNOSIS — L608 Other nail disorders: Secondary | ICD-10-CM

## 2013-02-17 DIAGNOSIS — L603 Nail dystrophy: Secondary | ICD-10-CM

## 2013-02-17 DIAGNOSIS — E1149 Type 2 diabetes mellitus with other diabetic neurological complication: Secondary | ICD-10-CM

## 2013-02-17 DIAGNOSIS — E1142 Type 2 diabetes mellitus with diabetic polyneuropathy: Secondary | ICD-10-CM

## 2013-02-17 DIAGNOSIS — R339 Retention of urine, unspecified: Secondary | ICD-10-CM

## 2013-02-17 MED ORDER — HYDROCODONE-ACETAMINOPHEN 7.5-325 MG PO TABS: 1.0000 | ORAL_TABLET | Freq: Three times a day (TID) | ORAL | Status: DC | PRN

## 2013-02-17 MED ORDER — TERBINAFINE HCL 250 MG PO TABS: 250.0000 mg | ORAL_TABLET | Freq: Every day | ORAL | Status: DC

## 2013-02-17 NOTE — Patient Instructions (Signed)
I am referring you to the sports medicine clinic for evaluation of your gait.  I am also giving you a prescription for diabetic shoes.  For the foot fungus, take the medicine every day. Please follow up with me in 6 weeks for blood work to make sure that your liver is okay.

## 2013-02-21 DIAGNOSIS — R2681 Unsteadiness on feet: Secondary | ICD-10-CM | POA: Insufficient documentation

## 2013-02-21 NOTE — Assessment & Plan Note (Signed)
Much improved s/p keflex for UTI. Continue to monitor

## 2013-02-21 NOTE — Progress Notes (Signed)
Patient ID: Tyler Lester    DOB: Sep 03, 1945, 67 y.o.   MRN: [id number] --- Subjective:  Tyler Lester is a 67 y.o.male who presents with concern regarding his gait. He feels like one of his legs is longer and that he has difficulty clearing his right foot when he walks. This has been ongoing for a long time but it is getting to be more problematic just recently. He doesn't wear diabetic shoes. He went to the podiatrist several months ago but did not return due to an outstanding bill. He denies any sores or ulcers on his feet. Denies any numbness or tingling. He feels like his toenails are thickened and he is not able to trim them.   - f/u on urinary incontinence: resolved with antibiotics. Urinating normally without accidents. No dysuria. Taking keflex.   ROS: see HPI Past Medical History: reviewed and updated medications and allergies. Social History: Tobacco: none  Objective: Filed Vitals:   02/17/13 1117  BP: 153/83  Pulse: 91  Temp: 98.3 F (36.8 C)    Physical Examination:   General appearance - alert, well appearing, and in no distress Chest - clear to auscultation, no wheezes, rales or rhonchi, symmetric air entry Heart - normal rate, regular rhythm, normal S1, S2, no murmurs Feet - superficial abrasion on the lateral aspect of the riht 5th toe along with 2 superficial abrasions on the dorsal aspect of the left foot.  Absent sensation to microfilament at the level of the big toe bilaterally, otherwise, sensation intact.  Faint dorsalis pedis pulses, 2+ posterior tibilais pulse bilaterally Thickened and friable nails on all toes bilaterally.   Procedure:  Trimmed toenails to shorter size. While cutting nails, nails were very soft and friable.

## 2013-02-21 NOTE — Assessment & Plan Note (Signed)
Likely coming from diabetic neuropathy. Patient needing diabetic shoes. Rx given. Would also benefit from gait evaluation at the sports medicine center in case he needs inserts. Referal to sports medicine made.

## 2013-02-21 NOTE — Assessment & Plan Note (Signed)
Nails with evidence of onychomycosis. Will empirically treat with terbinafine for 6 weeks wtith re-check of labs in 6 weeks.  LFTs from 04/2012 were normal.

## 2013-02-21 NOTE — Assessment & Plan Note (Signed)
Decreased microfilament testing in great toe.  -diabetic shoe prescription, which will hopefully help with small abrasions on feet - trimmed nails down - follow up in 3-4 weeks for repeat A1C - patient unable to follow up with podiatry due to outstanding bill

## 2013-03-05 ENCOUNTER — Telehealth: Payer: Self-pay | Admitting: *Deleted

## 2013-03-05 DIAGNOSIS — E1149 Type 2 diabetes mellitus with other diabetic neurological complication: Secondary | ICD-10-CM

## 2013-03-05 NOTE — Telephone Encounter (Signed)
"  Pt is unavailable at this time" (message on both numbers). LMOVM for pt to return call.  Please have pt make a fasting lab appt to check cholesterol.  Future orders in. Dakia Schifano, Salome Spotted

## 2013-03-09 ENCOUNTER — Ambulatory Visit: Payer: PRIVATE HEALTH INSURANCE | Admitting: Sports Medicine

## 2013-03-11 ENCOUNTER — Telehealth: Payer: Self-pay | Admitting: Family Medicine

## 2013-03-11 MED ORDER — HYDROCODONE-ACETAMINOPHEN 7.5-325 MG PO TABS: 1.0000 | ORAL_TABLET | Freq: Three times a day (TID) | ORAL | Status: DC | PRN

## 2013-03-11 NOTE — Telephone Encounter (Signed)
Will fwd to MD.  Yuma Blucher L, CMA  

## 2013-03-11 NOTE — Telephone Encounter (Signed)
Would like to have prescription for hydrocodone printed and left up front for pickup Says he would rather do this than wait for appt on Friday Please advise

## 2013-03-11 NOTE — Telephone Encounter (Signed)
Please tell patient that he can pick up Rx at front desk but that this will be the last time it will be refilled over the phone since our clinic policy asks that patients be seen in the clinic for a refill on their pain medications.  Thank you!  Liam Graham, PGY-3 Family Medicine Resident

## 2013-03-11 NOTE — Telephone Encounter (Signed)
Please tell patient that he can pick up Rx at front desk but that this will be the last time it will be refilled over the phone since our clinic policy asks that patients be seen in the clinic for a refill on their pain medications.  Thank you!  Ewen Varnell, PGY-3 Family Medicine Resident  

## 2013-03-11 NOTE — Telephone Encounter (Signed)
Pt notified and clinic policy clearly stated again to patient.  Pt verbalized understanding.  Tyler Lester, Loralyn Freshwater, [place]

## 2013-03-13 ENCOUNTER — Ambulatory Visit: Payer: PRIVATE HEALTH INSURANCE | Admitting: Family Medicine

## 2013-04-08 ENCOUNTER — Encounter: Payer: Self-pay | Admitting: Family Medicine

## 2013-04-08 ENCOUNTER — Ambulatory Visit (INDEPENDENT_AMBULATORY_CARE_PROVIDER_SITE_OTHER): Payer: PRIVATE HEALTH INSURANCE | Admitting: Family Medicine

## 2013-04-08 VITALS — BP 153/92 | HR 87 | Temp 98.2°F | Ht 69.0 in | Wt 207.0 lb

## 2013-04-08 DIAGNOSIS — I10 Essential (primary) hypertension: Secondary | ICD-10-CM

## 2013-04-08 DIAGNOSIS — E1149 Type 2 diabetes mellitus with other diabetic neurological complication: Secondary | ICD-10-CM

## 2013-04-08 DIAGNOSIS — L603 Nail dystrophy: Secondary | ICD-10-CM

## 2013-04-08 DIAGNOSIS — R600 Localized edema: Secondary | ICD-10-CM

## 2013-04-08 DIAGNOSIS — R609 Edema, unspecified: Secondary | ICD-10-CM

## 2013-04-08 DIAGNOSIS — L608 Other nail disorders: Secondary | ICD-10-CM

## 2013-04-08 LAB — COMPREHENSIVE METABOLIC PANEL
ALT: 15 U/L (ref 0–53)
AST: 20 U/L (ref 0–37)
Albumin: 4 g/dL (ref 3.5–5.2)
Alkaline Phosphatase: 46 U/L (ref 39–117)
BUN: 13 mg/dL (ref 6–23)
CO2: 32 mEq/L (ref 19–32)
Calcium: 9.5 mg/dL (ref 8.4–10.5)
Chloride: 103 mEq/L (ref 96–112)
Creat: 1.19 mg/dL (ref 0.50–1.35)
Glucose, Bld: 73 mg/dL (ref 70–99)
Potassium: 3.2 mEq/L — ABNORMAL LOW (ref 3.5–5.3)
Sodium: 139 mEq/L (ref 135–145)
Total Bilirubin: 0.5 mg/dL (ref 0.2–1.2)
Total Protein: 6.9 g/dL (ref 6.0–8.3)

## 2013-04-08 LAB — POCT GLYCOSYLATED HEMOGLOBIN (HGB A1C): Hemoglobin A1C: 6.4

## 2013-04-08 MED ORDER — ATORVASTATIN CALCIUM 20 MG PO TABS: 20.0000 mg | ORAL_TABLET | Freq: Every day | ORAL | Status: DC

## 2013-04-08 MED ORDER — GLIMEPIRIDE 4 MG PO TABS: 4.0000 mg | ORAL_TABLET | Freq: Every day | ORAL | Status: DC

## 2013-04-08 MED ORDER — HYDROCODONE-ACETAMINOPHEN 7.5-325 MG PO TABS: 1.0000 | ORAL_TABLET | Freq: Three times a day (TID) | ORAL | Status: DC | PRN

## 2013-04-08 MED ORDER — LISINOPRIL 20 MG PO TABS: 20.0000 mg | ORAL_TABLET | Freq: Every day | ORAL | Status: DC

## 2013-04-08 NOTE — Patient Instructions (Addendum)
Increase your lisinopril to 20mg  daily. I am sending a prescription for cholesterol and we will start you back on the fungal medicine.   Come back in 2 weeks for Blood pressure check and repeat labs.

## 2013-04-09 ENCOUNTER — Telehealth: Payer: Self-pay | Admitting: Family Medicine

## 2013-04-09 DIAGNOSIS — E876 Hypokalemia: Secondary | ICD-10-CM

## 2013-04-09 MED ORDER — POTASSIUM CHLORIDE CRYS ER 20 MEQ PO TBCR: 40.0000 meq | EXTENDED_RELEASE_TABLET | Freq: Once | ORAL | Status: DC

## 2013-04-09 MED ORDER — TERBINAFINE HCL 250 MG PO TABS: 250.0000 mg | ORAL_TABLET | Freq: Every day | ORAL | Status: DC

## 2013-04-09 NOTE — Telephone Encounter (Signed)
Called pt again 'person is unavailable right now'. Will try later. Tyler Lester, Tyler Lester

## 2013-04-09 NOTE — Telephone Encounter (Signed)
Tried calling patient to let him know of his lab results but did not get any answer and no option to leave message.  Please let patient know that his potassium was a little low and that I am sending one dose of potassium to take. It is important that he follow up with me in 2 weeks to check on his BP and lab check.  Also, his liver tests are normal and I will therefore send another dose of terbinafine for his nail fungus.   Thank you!  Liam Graham, PGY-3 Family Medicine Resident

## 2013-04-10 NOTE — Assessment & Plan Note (Signed)
Needs echo scheduled at next visit.

## 2013-04-10 NOTE — Telephone Encounter (Signed)
Called patient, phone states person is unavailable.  Phone is turned off.  Will fwd to MD for FYI that we do not have a current phone for patient.  Deretha Ertle, Loralyn Freshwater, [place]

## 2013-04-10 NOTE — Assessment & Plan Note (Signed)
Well controled with A1C of 6.4 today. Congratulated him on this.  No signs of hypoglycemia so will continue amaryl.  Continue metformin as well Patient is not on a statin and fasting lipid panels have been difficult to obtain. Since diabetic, will empirically start him on moderate dose statin: lipitor 20mg  daily. Do not have lipid panel, so unable to calculate ASCVD risk. Consider obtaining non fasting lipid panel at next visit.

## 2013-04-10 NOTE — Assessment & Plan Note (Signed)
Not at goal for diabetes.  - increase lisinopril to 20mg  daily and follow up in 2 weeks for repeat BMP and BP recheck.

## 2013-04-10 NOTE — Progress Notes (Signed)
Patient ID: Tyler Lester    DOB: [birth year], 68 y.o.   MRN: [id number] --- Subjective:  Tyler Lester is a 68 y.o.male who presents for follow up on diabetes and HTN.   # Diabetes: - medications: metformin 1000mg  bid and amaryl 4mg  daily.  - missed doses in 1 week: none - hypoglycemic events: none - CBG's at home: doesn't check - frequency of monitoring: na - numbness or tingling in lower extremities? Intermittently, better with pain medicine - sores or ulcers? None - nails: continues to have thickened nails. Has been taking terbinafine since it was prescribed.  - seen by podiatry? No. Filled Rx for diabetic shoes. Still feels like he looses his balance.  - last eye check?Followed by ophthalmology and scheduled for surgery next month  # Hypertension: Medications: lisinopril 10mg  daily and hctz 25 daily and metoprolol succinate 50 Number of doses missed in 1 week: 0 BP at home: doesn't check Exercise routine: none Salt in diet: some Chest pain: none Lower extremity swelling: stable Shortness of breath: none Headache: none Change in vision: none  ROS: see HPI Past Medical History: reviewed and updated medications and allergies. Social History: Tobacco: none  Objective: Filed Vitals:   04/08/13 1115  BP: 153/92  Pulse: 87  Temp: 98.2 F (36.8 C)    Physical Examination:   General appearance - alert, well appearing, and in no distress Chest - clear to auscultation, no wheezes, rales or rhonchi, symmetric air entry Heart - normal rate, regular rhythm, normal S1, S2, no murmurs  Extremities - +1 pedal edema, dorsalis pedis pulses difficult to palpate, long, thick and friable nails bilaterally, decreased sensation to light touch in great toes bilaterally  Trimmed nails bilaterally with toe nail clippers. Patient tolerated this well.

## 2013-04-10 NOTE — Assessment & Plan Note (Addendum)
Still significant dystrophy.  - LFT's checked and normal - refill terbinafine for another 6 weeks of treatment - nails trimmed today

## 2013-04-13 ENCOUNTER — Encounter: Payer: Self-pay | Admitting: Family Medicine

## 2013-04-22 ENCOUNTER — Encounter: Payer: Self-pay | Admitting: Home Health Services

## 2013-04-30 ENCOUNTER — Encounter: Payer: Self-pay | Admitting: Family Medicine

## 2013-04-30 ENCOUNTER — Ambulatory Visit (INDEPENDENT_AMBULATORY_CARE_PROVIDER_SITE_OTHER): Payer: PRIVATE HEALTH INSURANCE | Admitting: Family Medicine

## 2013-04-30 VITALS — BP 103/44 | HR 102 | Temp 98.6°F | Ht 69.0 in | Wt 208.0 lb

## 2013-04-30 DIAGNOSIS — R609 Edema, unspecified: Secondary | ICD-10-CM

## 2013-04-30 DIAGNOSIS — J069 Acute upper respiratory infection, unspecified: Secondary | ICD-10-CM | POA: Insufficient documentation

## 2013-04-30 DIAGNOSIS — R600 Localized edema: Secondary | ICD-10-CM

## 2013-04-30 DIAGNOSIS — R05 Cough: Secondary | ICD-10-CM

## 2013-04-30 DIAGNOSIS — R059 Cough, unspecified: Secondary | ICD-10-CM

## 2013-04-30 DIAGNOSIS — I10 Essential (primary) hypertension: Secondary | ICD-10-CM

## 2013-04-30 MED ORDER — IPRATROPIUM BROMIDE 0.06 % NA SOLN: 2.0000 | Freq: Four times a day (QID) | NASAL | Status: DC

## 2013-04-30 MED ORDER — LISINOPRIL 10 MG PO TABS: 10.0000 mg | ORAL_TABLET | Freq: Every day | ORAL | Status: DC

## 2013-04-30 MED ORDER — HYDROCODONE-ACETAMINOPHEN 7.5-325 MG PO TABS: 1.0000 | ORAL_TABLET | Freq: Three times a day (TID) | ORAL | Status: DC | PRN

## 2013-04-30 NOTE — Assessment & Plan Note (Addendum)
Patient reporting some shortness of breath. Will check echo to rule out heart failure as etiology

## 2013-04-30 NOTE — Assessment & Plan Note (Signed)
Likely viral in nature, but given chest exam with slight decreased air movement on left, will check cxr. Normal o2 which is reassuring.

## 2013-04-30 NOTE — Progress Notes (Signed)
Patient ID: Tyler Lester    DOB: 1945-12-12, 68 y.o.   MRN: [id number] --- Subjective:  Tyler Lester is a 68 y.o.male who presents for follow up on HTN. His other concerns also include:  - Cough and nasal congestion: Has had cough for last 3 days. Nonproductive. Feeling congested. No increased dyspnea compared to baseline. No fevers no chills. No myalgias. His friend had a viral upper respiratory infection he thinks he caught it.  - Hypertension: Lisinopril was increased 2 weeks ago from 10 mg 20 mg. He has been compliant with medications. He currently takes hydrochlorothiazide 25 and lisinopril 20. He feels lightheaded when he gets up. He has not been checking his blood pressure at home. He denies any chest pain. He does report that he has been feeling more tired. He also has some shortness of breath when he walks short distances. He denies any orthopnea. He continues to have lower extremity swelling.   ROS: see HPI Past Medical History: reviewed and updated medications and allergies. Social History: Tobacco: none  Objective: Filed Vitals:   04/30/13 1555  BP: 103/44  Pulse: 102  Temp: 98.6 F (37 C)    Physical Examination:   General appearance - alert, well appearing, and in no distress  Ears - bilateral TM's and external ear canals normal Nose - erythematous and congested nasal turbinates bilaterally Mouth - erythematous posterior oropharynx, no tonsillar exudates. Neck - supple, no significant adenopathy Chest - scattered wheezing, left upper lobe with decreased breath sounds compared to right, normal work of breathing, no crackles Heart - normal rate, regular rhythm, normal S1, S2, no murmurs

## 2013-04-30 NOTE — Assessment & Plan Note (Signed)
Blood pressure now low on hydrochlorothiazide 25 mg and lisinopril 20 mg daily. Will stop HCTZ and lisinopril 20. Start back on lisinopril 10 mg daily. Followup with me in one week to recheck. Will also check repeat BMP in the setting of double to dose of ACE inhibitor today.

## 2013-04-30 NOTE — Patient Instructions (Addendum)
For the nasal congestion, start taking the ipratropium spray.  Also get the chest xray  For the blood pressure, your blood pressure is low, so let's stop the lisinopril and the hydrochlorothiazide. Start on lisinopril 10mg  daily.   Follow up in 1 week with me.   We are also going to check your heart with the echo.

## 2013-05-01 ENCOUNTER — Telehealth: Payer: Self-pay | Admitting: Family Medicine

## 2013-05-01 LAB — BASIC METABOLIC PANEL
BUN: 20 mg/dL (ref 6–23)
CO2: 31 mEq/L (ref 19–32)
Calcium: 9.5 mg/dL (ref 8.4–10.5)
Chloride: 100 mEq/L (ref 96–112)
Creat: 1.99 mg/dL — ABNORMAL HIGH (ref 0.50–1.35)
Glucose, Bld: 125 mg/dL — ABNORMAL HIGH (ref 70–99)
Potassium: 3.3 mEq/L — ABNORMAL LOW (ref 3.5–5.3)
Sodium: 140 mEq/L (ref 135–145)

## 2013-05-01 NOTE — Telephone Encounter (Signed)
Called patient and discussed results of BMP with him showing acute renal failure with Cr of 1.9 from 1.19. Patient's lisinopril had been increased to 20mg  daily from 10mg  daily which caused marked BP drop.  I think the renal failure is coming hypotension and renal hypoperfusion.  Patient states that he takes 1-2 ibuprofen per day. I asked him to not take anymore ibuprofen or over the counter pain relievers.   In clinic we had discussed stopping lisinopril 20mg  and hctz 25mg  daily and starting back lisinopril 10mg  daily. Instructed him to hold lisinopril 10mg  daily until Tuesday. Patient expressed understanding and agreed with plan.  Tyler Lester, PGY-3 Family Medicine Resident

## 2013-05-06 ENCOUNTER — Encounter: Payer: Self-pay | Admitting: Family Medicine

## 2013-05-06 ENCOUNTER — Ambulatory Visit (HOSPITAL_COMMUNITY)
Admission: RE | Admit: 2013-05-06 | Discharge: 2013-05-06 | Disposition: A | Discharge status: Home / Self Care | Payer: PRIVATE HEALTH INSURANCE | Source: Ambulatory Visit | Attending: Family Medicine | Admitting: Family Medicine

## 2013-05-06 ENCOUNTER — Other Ambulatory Visit (HOSPITAL_COMMUNITY): Payer: Self-pay | Admitting: Family Medicine

## 2013-05-06 DIAGNOSIS — N179 Acute kidney failure, unspecified: Secondary | ICD-10-CM

## 2013-05-06 DIAGNOSIS — I5032 Chronic diastolic (congestive) heart failure: Secondary | ICD-10-CM | POA: Insufficient documentation

## 2013-05-06 DIAGNOSIS — I369 Nonrheumatic tricuspid valve disorder, unspecified: Secondary | ICD-10-CM

## 2013-05-06 NOTE — Progress Notes (Signed)
  Echocardiogram 2D Echocardiogram has been performed.  Nakiya Rallis FRANCES 05/06/2013, 4:24 PM

## 2013-05-07 ENCOUNTER — Encounter: Payer: Self-pay | Admitting: Family Medicine

## 2013-05-07 ENCOUNTER — Ambulatory Visit (INDEPENDENT_AMBULATORY_CARE_PROVIDER_SITE_OTHER): Payer: PRIVATE HEALTH INSURANCE | Admitting: Family Medicine

## 2013-05-07 VITALS — BP 148/87 | HR 84 | Ht 69.0 in | Wt 206.9 lb

## 2013-05-07 DIAGNOSIS — I10 Essential (primary) hypertension: Secondary | ICD-10-CM

## 2013-05-07 DIAGNOSIS — N179 Acute kidney failure, unspecified: Secondary | ICD-10-CM

## 2013-05-07 DIAGNOSIS — J069 Acute upper respiratory infection, unspecified: Secondary | ICD-10-CM

## 2013-05-07 LAB — BASIC METABOLIC PANEL
BUN: 12 mg/dL (ref 6–23)
CO2: 32 mEq/L (ref 19–32)
Calcium: 9.2 mg/dL (ref 8.4–10.5)
Chloride: 102 mEq/L (ref 96–112)
Creat: 1.06 mg/dL (ref 0.50–1.35)
Glucose, Bld: 109 mg/dL — ABNORMAL HIGH (ref 70–99)
Potassium: 3.6 mEq/L (ref 3.5–5.3)
Sodium: 142 mEq/L (ref 135–145)

## 2013-05-07 NOTE — Patient Instructions (Signed)
Please go and get a chest xray   Don't take your blood pressure medicine until I call you.   See you back in 2 weeks.

## 2013-05-08 ENCOUNTER — Telehealth: Payer: Self-pay | Admitting: Family Medicine

## 2013-05-08 DIAGNOSIS — N179 Acute kidney failure, unspecified: Secondary | ICD-10-CM | POA: Insufficient documentation

## 2013-05-08 DIAGNOSIS — N183 Chronic kidney disease, stage 3 unspecified: Secondary | ICD-10-CM | POA: Insufficient documentation

## 2013-05-08 NOTE — Assessment & Plan Note (Signed)
Check BMP after lisinopril was held and BP back to normal

## 2013-05-08 NOTE — Progress Notes (Signed)
Patient ID: Tyler Lester    DOB: 02/23/1946, 68 y.o.   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  - low BP: at last visit, BP was in the 84'O systolic. Lisinopril and hctz were stopped. Patient had some associated acute renal failure with Cr increase from 1.1 to 1.9.  He has not taken any of his BP meds since his last visit. Doesn't check BP at home. He has been eating and drinking normally. Not feeling lightheaded or dizzy.  - cough: still feeling congested with feeling of congestion in chest. Coughing green phlegm. Does feel like it is improving slightly. No shortness of breath. No fevers.   ROS: see HPI Past Medical History: reviewed and updated medications and allergies. Social History: Tobacco: none  Objective: Filed Vitals:   05/07/13 1629  BP: 148/87  Pulse: 84   Repeat BP: 130/80  Physical Examination:   General appearance - alert, well appearing, and in no distress Nose - erythematous and congested nasal turbinates bilaterally Mouth - mucous membranes moist, erythematous oropharynx but no tonsilar exudates Neck - supple, no significant adenopathy Chest - clear to auscultation, no wheezes, rales or rhonchi, symmetric air entry Heart - normal rate, regular rhythm, normal S1, S2, no murmurs Neuro - resting tremor of left hand  Echo 05/06/13: reviewed with patient.   Study Conclusions  - Left ventricle: The cavity size was normal. Wall thickness was increased in a pattern of mild LVH. Systolic function was normal. The estimated ejection fraction was in the range of 55% to 60%. Wall motion was normal; there were no regional wall motion abnormalities. Doppler parameters are consistent with abnormal left ventricular relaxation (grade 1 diastolic dysfunction). - Atrial septum: No defect or patent foramen ovale was identified. Transthoracic  echocardiography. M-mode, complete 2D, spectral Doppler, and color Doppler. Height: Height: 175.3cm. Height: 69in. Weight: Weight: 90.9kg. Weight: 200lb. Body mass index: BMI: 29.6kg/m^2. Body surface area: BSA: 2.11m^2. Blood pressure: 103/44. Patient status: Outpatient. Location: Echo laboratory.

## 2013-05-08 NOTE — Telephone Encounter (Signed)
Called patient and let him know that Creatinine is now back in normal range.  Since BP yesterday was essentially normal off of BP meds, will continue to hold until patient follows up in 10 days. At that point, will reassess. If high, could consider starting back lisinopril with close follow up.  Liam Graham, PGY-3 Family Medicine Resident

## 2013-05-08 NOTE — Assessment & Plan Note (Signed)
Still symptomatic with cough. O2 of 96% on ra in clinic.  Patient to get chest xray

## 2013-05-08 NOTE — Assessment & Plan Note (Signed)
Recently low BP on lisinopril 20 and hctz 25mg .  BP now normal without meds.  Continue holding meds for 2 weeks for recheck BMP to check renal function.

## 2013-05-11 ENCOUNTER — Telehealth: Payer: Self-pay | Admitting: Family Medicine

## 2013-05-11 NOTE — Telephone Encounter (Signed)
Called pt. Explained to pt, that we do not fill out disability forms in our office. Pt has to take the form to [employer] and they will request info from [place] after he signs a ROI there. Pt verbalized understanding and will pick up his forms from [place]. Javier Glazier, Gerrit Heck

## 2013-05-11 NOTE — Telephone Encounter (Signed)
Patient dropped off disability papers to be filled out.  Please fax when completed.

## 2013-05-21 ENCOUNTER — Encounter: Payer: Self-pay | Admitting: Family Medicine

## 2013-05-21 ENCOUNTER — Ambulatory Visit (INDEPENDENT_AMBULATORY_CARE_PROVIDER_SITE_OTHER): Payer: PRIVATE HEALTH INSURANCE | Admitting: Family Medicine

## 2013-05-21 VITALS — BP 145/80 | HR 93 | Temp 98.2°F | Ht 69.0 in | Wt 208.0 lb

## 2013-05-21 DIAGNOSIS — R251 Tremor, unspecified: Secondary | ICD-10-CM

## 2013-05-21 DIAGNOSIS — R059 Cough, unspecified: Secondary | ICD-10-CM

## 2013-05-21 DIAGNOSIS — R259 Unspecified abnormal involuntary movements: Secondary | ICD-10-CM

## 2013-05-21 DIAGNOSIS — R05 Cough: Secondary | ICD-10-CM

## 2013-05-21 DIAGNOSIS — I10 Essential (primary) hypertension: Secondary | ICD-10-CM

## 2013-05-21 LAB — BASIC METABOLIC PANEL
BUN: 14 mg/dL (ref 6–23)
CO2: 29 mEq/L (ref 19–32)
Calcium: 9.4 mg/dL (ref 8.4–10.5)
Chloride: 101 mEq/L (ref 96–112)
Creat: 1.17 mg/dL (ref 0.50–1.35)
Glucose, Bld: 144 mg/dL — ABNORMAL HIGH (ref 70–99)
Potassium: 3.5 mEq/L (ref 3.5–5.3)
Sodium: 139 mEq/L (ref 135–145)

## 2013-05-21 MED ORDER — OMEPRAZOLE 20 MG PO CPDR: 20.0000 mg | DELAYED_RELEASE_CAPSULE | Freq: Every day | ORAL | Status: DC

## 2013-05-21 MED ORDER — HYDROCODONE-ACETAMINOPHEN 7.5-325 MG PO TABS: 1.0000 | ORAL_TABLET | Freq: Three times a day (TID) | ORAL | Status: DC | PRN

## 2013-05-21 MED ORDER — LISINOPRIL 10 MG PO TABS: 10.0000 mg | ORAL_TABLET | Freq: Every day | ORAL | Status: DC

## 2013-05-21 MED ORDER — AZITHROMYCIN 250 MG PO TABS: ORAL_TABLET | ORAL | Status: DC

## 2013-05-21 NOTE — Patient Instructions (Addendum)
For the cough and congestion, you can take mucinex and I am also going to treat you with an antibiotic.  Also, we are going to treat your acid reflux and see if this helps your cough.   For the blood pressure, continue taking lisinopril 10mg  daily.   Follow up in 2 weeks to see how you are doing.

## 2013-05-23 DIAGNOSIS — R251 Tremor, unspecified: Secondary | ICD-10-CM | POA: Insufficient documentation

## 2013-05-23 NOTE — Progress Notes (Signed)
Patient ID: Tyler Lester    DOB: 08-07-1945, 68 y.o.   MRN: [id number] --- Subjective:  Tyler Lester is a 68 y.o.male who presents for follow up on hypertension. He is also here with chest congestion and cough.  - hypertension: had been started back on lisinopril 10mg  daily which he has been taking. No light headedness. No headache or change in vision. No chest pain. No significant lower extremity swelling. He doesn't check BP's at home - chest congestion and cough: he reports having gone to get a chest xray, but report doesn't appear in epic. He continues to experience cough worst at night, associated with nasal congestion and rhinorrhea. He denies any difficulty breathing. This has been ongoing for several weeks and has not significantly improved.   ROS: see HPI Past Medical History: reviewed and updated medications and allergies. Social History: Tobacco: none  Objective: Filed Vitals:   05/21/13 1414  BP: 145/80  Pulse: 93  Temp: 98.2 F (36.8 C)   Repeat manual BP: 130/80 Physical Examination:   General appearance - alert, well appearing, and in no distress Ears -  Nose - erythematous and congested nasal turbinates Mouth - mucous membranes moist, cobblestone pattern present  Neck - supple, no significant adenopathy Chest - clear to auscultation, normal work of breathing, occasional wheeze that clears after coughing Heart - normal rate, regular rhythm, normal S1, S2, no murmurs Neuro - coarse, low frequency tremor of left hand, no tremor with finger to nose.

## 2013-05-23 NOTE — Assessment & Plan Note (Signed)
Cough and chest congestion.  Patient tells me he had cxr done, but I don't see the report.  Since this has been ongoing for a few weeks, will treat for atypical pna with azithromycin. Follow up in 1-2 weeks.

## 2013-05-23 NOTE — Assessment & Plan Note (Signed)
BP controled with lisinopril 10 mg daily. Check BMP.  Patient had episode of low BP's with hctz, lisinopril and metop.  No clear reason for metop at this time. WIll continue holding hctz and metop.

## 2013-05-23 NOTE — Assessment & Plan Note (Signed)
Did not go into much detail given time spent on other issues, but would like to evaluate patient more fully.  May benefit from neuro consult for evaluation of parkinson's.

## 2013-05-24 ENCOUNTER — Telehealth: Payer: Self-pay | Admitting: Family Medicine

## 2013-05-24 NOTE — Telephone Encounter (Signed)
Please let patient know that his labwork was normal. Thank you! Liam Graham, PGY-3 Family Medicine Resident

## 2013-05-25 NOTE — Telephone Encounter (Signed)
Called pt, no answer.  States "person is unavailable right now"  Tyler Lester, [name]

## 2013-05-25 NOTE — Telephone Encounter (Signed)
Called pt. Unable to reach or leave message 'person is unavailable right now'.  Javier Glazier, Gerrit Heck

## 2013-05-27 ENCOUNTER — Ambulatory Visit: Payer: PRIVATE HEALTH INSURANCE | Admitting: Family Medicine

## 2013-06-01 ENCOUNTER — Encounter: Payer: Self-pay | Admitting: Family Medicine

## 2013-06-17 ENCOUNTER — Ambulatory Visit (INDEPENDENT_AMBULATORY_CARE_PROVIDER_SITE_OTHER): Payer: PRIVATE HEALTH INSURANCE | Admitting: Family Medicine

## 2013-06-17 ENCOUNTER — Encounter: Payer: Self-pay | Admitting: Family Medicine

## 2013-06-17 VITALS — BP 145/81 | HR 89 | Ht 69.0 in | Wt 206.0 lb

## 2013-06-17 DIAGNOSIS — R251 Tremor, unspecified: Secondary | ICD-10-CM

## 2013-06-17 DIAGNOSIS — R259 Unspecified abnormal involuntary movements: Secondary | ICD-10-CM

## 2013-06-17 MED ORDER — HYDROCODONE-ACETAMINOPHEN 7.5-325 MG PO TABS: 1.0000 | ORAL_TABLET | Freq: Three times a day (TID) | ORAL | Status: DC | PRN

## 2013-06-17 NOTE — Progress Notes (Signed)
Patient ID: Tyler Lester    DOB: 1945-06-17, 68 y.o.   MRN: [id number] --- Subjective:  Tyler Lester is a 68 y.o.male who presents for evaluation of hand and mouth tremor.  - Tremor: started over a month ago, getting worst and more noticeable. Present bilaterally but left hand more than right. He has also noticed some tremor of his mouth. Tremor of the hand is more pronounced when he is at rest. Doesn't interfere with eating. He does feel weak in the left hand and has been having more trouble grasping things. He also feels like he walks more slowly than he used to. He also reports less energy.  Tremor is not affected by alcohol use. He denies any family history of tremor.   ROS: see HPI Past Medical History: reviewed and updated medications and allergies. Social History: Tobacco: none  Objective: Filed Vitals:   06/17/13 1429  BP: 145/81  Pulse: 89    Physical Examination:   General appearance - alert, well appearing, and in no distress Neuro - CN2-12 grossly intact 4/5 grip strength on left compared to 5/5 on right tremor of the mouth and tongue present with active movement of the mouth.  Low frequency tremor of left hand present at rest.  Normal finger to nose bilaterally 2+ patellar reflexes bilaterally  Negative Babinski 2-3 beats of clonus on left, none on right.  Gait: favors right leg, minimal shuffling No obvious cogwheel rigidity bilaterally

## 2013-06-17 NOTE — Patient Instructions (Signed)
Like we talked about, I would like for you to be seen by the neurologists to evaluate you for the tremor you are having.  We will be in touch with you about this. If you have not heard back from [place] in 1 week, please call us back.

## 2013-06-19 NOTE — Assessment & Plan Note (Signed)
Given low frequency tremor present at rest, concern for Parkinson's.  - will refer patient to neurology for full evaluation

## 2013-07-13 ENCOUNTER — Encounter: Payer: Self-pay | Admitting: Family Medicine

## 2013-07-13 ENCOUNTER — Ambulatory Visit (INDEPENDENT_AMBULATORY_CARE_PROVIDER_SITE_OTHER): Payer: PRIVATE HEALTH INSURANCE | Admitting: Family Medicine

## 2013-07-13 VITALS — BP 147/85 | HR 85 | Ht 69.0 in | Wt 206.0 lb

## 2013-07-13 DIAGNOSIS — M47812 Spondylosis without myelopathy or radiculopathy, cervical region: Secondary | ICD-10-CM

## 2013-07-13 DIAGNOSIS — I10 Essential (primary) hypertension: Secondary | ICD-10-CM

## 2013-07-13 DIAGNOSIS — G8929 Other chronic pain: Secondary | ICD-10-CM | POA: Insufficient documentation

## 2013-07-13 MED ORDER — GABAPENTIN 100 MG PO CAPS: 100.0000 mg | ORAL_CAPSULE | Freq: Three times a day (TID) | ORAL | Status: DC

## 2013-07-13 NOTE — Assessment & Plan Note (Signed)
Patient has documented C5-C6 DDD based on previous imaging. Now having worsening bilateral hand numbness and weakness. Patient requests referral to Orthopedic Surgery at [facility] -MRI cervical spine ordered to further evaluate spine disease -Started on Gabapentin 100 mg TID, continue Flexeril and Norco as needed -Referral to Orthopedic Surgery placed

## 2013-07-13 NOTE — Assessment & Plan Note (Signed)
Blood pressure mildly elevated today. No changes to pharmacotherapy made today.  -Patient told to follow up with PCP

## 2013-07-13 NOTE — Patient Instructions (Signed)
Neck Pain/Hand numbness - you will be sent for an MRI of your neck, A prior-authorization has to be completed first, our nursing staff will contact you to scheduled the MRI, you have also been referred to Orthopedic surgery, our referral coordinator will contact you about your appointment  A prescription for gabapentin has also been sent to your pharmacy, please start taking this evening  Make a follow up appointment with Dr. Otis Dials.

## 2013-07-13 NOTE — Progress Notes (Signed)
   Subjective:    Patient ID: Tyler Lester, male    DOB: [birth year], 68 y.o.   MRN: [id number]  HPI 68 y/o male presents for follow up of neck pain. Patient has a history of cervical DDD. He wishes to see a Psychologist, sport and exercise at [facility] and was told that he needs a referral prior to being seen in their office. He reports neck pain that has been present for many years, he states that he has had worsening bilateral hand numbness and weakness (left > right). He frequently drops objects. Is currently on Flexeril which provides minimal relief of symptoms, has also had Gabapentin which he is currently not taking as it makes him sleepy, takes Norco three times a day to help his pain   Review of Systems  Musculoskeletal: Positive for arthralgias, back pain, neck pain and neck stiffness.  Neurological: Positive for weakness and numbness. Negative for tremors and syncope.       Objective:   Physical Exam Vitals: reviewed Gen: pleasant AAM, NAD MSK: midline and paraspinal cervical tenderness, decreased neck ROM (limited by pain) Neuro: CN2-12 intact, bilateral arm abduction/aduction/flexion/extension 5/5, left grip strength 4/5, right grip strength 5/5, decreased sensation to light touch on bilateral hands, 2+ biceps and BR reflexes bilaterally   2007 CT Cervical Spine - C5-C6 DDD 2012 cervical xray - stable C5-C6 cervical spondylosis      Assessment & Plan:  Please see problem specific assessment and plan.

## 2013-07-14 ENCOUNTER — Telehealth: Payer: Self-pay | Admitting: *Deleted

## 2013-07-14 NOTE — Telephone Encounter (Signed)
Left message for patient to return call. Please tell him I scheduled his appointment for MRI on Wednesday May 27 at 6:30 pm at [facility]. If he needs to reschedule the appointment he can call them at [phone].Jaymes Graff [place]

## 2013-07-15 NOTE — Telephone Encounter (Signed)
Attempted to call patient again at all numbers listed in his chart and I am unable to reach him or leave a voicemail. Please see previous message.Tyler Lester

## 2013-07-16 NOTE — Telephone Encounter (Signed)
Attempted to call patient a third time, unable to reach patient at the three numbers listed in his chart. Please see previous message.Tyler Lester

## 2013-07-22 ENCOUNTER — Ambulatory Visit
Admission: RE | Admit: 2013-07-22 | Discharge: 2013-07-22 | Disposition: A | Discharge status: Home / Self Care | Payer: PRIVATE HEALTH INSURANCE | Source: Ambulatory Visit | Attending: Family Medicine | Admitting: Family Medicine

## 2013-07-22 ENCOUNTER — Telehealth: Payer: Self-pay | Admitting: Family Medicine

## 2013-07-22 ENCOUNTER — Encounter: Payer: Self-pay | Admitting: Family Medicine

## 2013-07-22 DIAGNOSIS — M47812 Spondylosis without myelopathy or radiculopathy, cervical region: Secondary | ICD-10-CM

## 2013-07-22 NOTE — Telephone Encounter (Signed)
Discussed results of MRI with patient, he has appointment with Orthopedic surgery to discuss possible surgery on June 1

## 2013-07-27 ENCOUNTER — Other Ambulatory Visit: Payer: Self-pay | Admitting: Family Medicine

## 2013-07-27 ENCOUNTER — Ambulatory Visit: Payer: PRIVATE HEALTH INSURANCE | Admitting: Family Medicine

## 2013-07-27 MED ORDER — HYDROCODONE-ACETAMINOPHEN 7.5-325 MG PO TABS: 1.0000 | ORAL_TABLET | Freq: Three times a day (TID) | ORAL | Status: DC | PRN

## 2013-07-31 ENCOUNTER — Encounter: Payer: Self-pay | Admitting: Family Medicine

## 2013-07-31 ENCOUNTER — Ambulatory Visit (INDEPENDENT_AMBULATORY_CARE_PROVIDER_SITE_OTHER): Payer: PRIVATE HEALTH INSURANCE | Admitting: Family Medicine

## 2013-07-31 VITALS — BP 138/76 | HR 89 | Temp 97.7°F | Wt 214.0 lb

## 2013-07-31 DIAGNOSIS — M47812 Spondylosis without myelopathy or radiculopathy, cervical region: Secondary | ICD-10-CM

## 2013-07-31 DIAGNOSIS — E1149 Type 2 diabetes mellitus with other diabetic neurological complication: Secondary | ICD-10-CM

## 2013-07-31 DIAGNOSIS — M799 Soft tissue disorder, unspecified: Secondary | ICD-10-CM

## 2013-07-31 DIAGNOSIS — M7989 Other specified soft tissue disorders: Secondary | ICD-10-CM

## 2013-07-31 LAB — POCT GLYCOSYLATED HEMOGLOBIN (HGB A1C): Hemoglobin A1C: 6.3

## 2013-07-31 MED ORDER — LISINOPRIL 10 MG PO TABS: 10.0000 mg | ORAL_TABLET | Freq: Every day | ORAL | Status: DC

## 2013-07-31 MED ORDER — CYCLOBENZAPRINE HCL 10 MG PO TABS
10.0000 mg | ORAL_TABLET | Freq: Two times a day (BID) | ORAL | Status: DC | PRN
Start: 2013-07-31 — End: 2013-11-12

## 2013-07-31 MED ORDER — HYDROCODONE-ACETAMINOPHEN 7.5-325 MG PO TABS: 1.0000 | ORAL_TABLET | Freq: Three times a day (TID) | ORAL | Status: DC | PRN

## 2013-07-31 NOTE — Patient Instructions (Signed)
We will be in touch for the referral.  I would like to ultrasound that lump on your shoulder.   Follow up in 1 month to meet the new doctor.

## 2013-08-03 ENCOUNTER — Emergency Department (HOSPITAL_COMMUNITY)
Admission: EM | Admit: 2013-08-03 | Discharge: 2013-08-03 | Disposition: A | Discharge status: Home / Self Care | Payer: PRIVATE HEALTH INSURANCE | Attending: Emergency Medicine | Admitting: Emergency Medicine

## 2013-08-03 ENCOUNTER — Emergency Department (HOSPITAL_COMMUNITY): Payer: PRIVATE HEALTH INSURANCE

## 2013-08-03 ENCOUNTER — Encounter (HOSPITAL_COMMUNITY): Payer: Self-pay | Admitting: Emergency Medicine

## 2013-08-03 DIAGNOSIS — Z792 Long term (current) use of antibiotics: Secondary | ICD-10-CM | POA: Insufficient documentation

## 2013-08-03 DIAGNOSIS — G8929 Other chronic pain: Secondary | ICD-10-CM | POA: Insufficient documentation

## 2013-08-03 DIAGNOSIS — M7989 Other specified soft tissue disorders: Secondary | ICD-10-CM | POA: Insufficient documentation

## 2013-08-03 DIAGNOSIS — Z79899 Other long term (current) drug therapy: Secondary | ICD-10-CM | POA: Insufficient documentation

## 2013-08-03 DIAGNOSIS — R609 Edema, unspecified: Secondary | ICD-10-CM | POA: Insufficient documentation

## 2013-08-03 DIAGNOSIS — N289 Disorder of kidney and ureter, unspecified: Secondary | ICD-10-CM | POA: Insufficient documentation

## 2013-08-03 DIAGNOSIS — E876 Hypokalemia: Secondary | ICD-10-CM | POA: Insufficient documentation

## 2013-08-03 DIAGNOSIS — E785 Hyperlipidemia, unspecified: Secondary | ICD-10-CM | POA: Insufficient documentation

## 2013-08-03 DIAGNOSIS — E119 Type 2 diabetes mellitus without complications: Secondary | ICD-10-CM | POA: Insufficient documentation

## 2013-08-03 DIAGNOSIS — I10 Essential (primary) hypertension: Secondary | ICD-10-CM | POA: Insufficient documentation

## 2013-08-03 LAB — CBC WITH DIFFERENTIAL/PLATELET
Basophils Absolute: 0 10*3/uL (ref 0.0–0.1)
Basophils Relative: 0 % (ref 0–1)
Eosinophils Absolute: 0.1 10*3/uL (ref 0.0–0.7)
Eosinophils Relative: 2 % (ref 0–5)
HCT: 40.7 % (ref 39.0–52.0)
Hemoglobin: 13.5 g/dL (ref 13.0–17.0)
Lymphocytes Relative: 39 % (ref 12–46)
Lymphs Abs: 2.2 10*3/uL (ref 0.7–4.0)
MCH: 29.5 pg (ref 26.0–34.0)
MCHC: 33.2 g/dL (ref 30.0–36.0)
MCV: 88.9 fL (ref 78.0–100.0)
Monocytes Absolute: 0.4 10*3/uL (ref 0.1–1.0)
Monocytes Relative: 8 % (ref 3–12)
Neutro Abs: 2.8 10*3/uL (ref 1.7–7.7)
Neutrophils Relative %: 51 % (ref 43–77)
Platelets: 183 10*3/uL (ref 150–400)
RBC: 4.58 MIL/uL (ref 4.22–5.81)
RDW: 13.4 % (ref 11.5–15.5)
WBC: 5.5 10*3/uL (ref 4.0–10.5)

## 2013-08-03 LAB — I-STAT CHEM 8, ED
BUN: 11 mg/dL (ref 6–23)
Calcium, Ion: 1.32 mmol/L — ABNORMAL HIGH (ref 1.13–1.30)
Chloride: 101 mEq/L (ref 96–112)
Creatinine, Ser: 1.5 mg/dL — ABNORMAL HIGH (ref 0.50–1.35)
Glucose, Bld: 88 mg/dL (ref 70–99)
HCT: 44 % (ref 39.0–52.0)
Hemoglobin: 15 g/dL (ref 13.0–17.0)
Potassium: 3.1 mEq/L — ABNORMAL LOW (ref 3.7–5.3)
Sodium: 147 mEq/L (ref 137–147)
TCO2: 27 mmol/L (ref 0–100)

## 2013-08-03 LAB — PRO B NATRIURETIC PEPTIDE: Pro B Natriuretic peptide (BNP): 16 pg/mL (ref 0–125)

## 2013-08-03 MED ORDER — POTASSIUM CHLORIDE CRYS ER 20 MEQ PO TBCR
40.0000 meq | EXTENDED_RELEASE_TABLET | Freq: Once | ORAL | Status: AC
  Administered 2013-08-03: 40 meq via ORAL
  Filled 2013-08-03: qty 2

## 2013-08-03 NOTE — Discharge Instructions (Signed)
Edema Keep your legs elevated above her heart as much as possible. Keep your scheduled appointment with the family practice clinic tomorrow at 11 AM. Tell your doctor that you were here and that you have lab work done here Ask them to help you with your diet. You need to cut down on salt. Ask for a referral to a foot doctor Edema is a buildup of fluids. It is most common in the feet, ankles, and legs. This happens more as a person ages. It may affect one or both legs. HOME CARE   Raise (elevate) the legs or ankles above the level of the heart while lying down.  Avoid sitting or standing still for a long time.  Exercise the legs to help the puffiness (swelling) go down.  A low-salt diet may help lessen the puffiness.  Only take medicine as told by your doctor. GET HELP RIGHT AWAY IF:   You develop shortness of breath or chest pain.  You cannot breathe when you lie down.  You have more puffiness that does not go away with treatment.  You develop pain or redness in the areas that are puffy.  You have a temperature by mouth above 102 F (38.9 C), not controlled by medicine.  You gain 03 lb/1.4 kg or more in 1 day or 05 lb/2.3 kg in a week. MAKE SURE YOU:   Understand these instructions.  Will watch your condition.  Will get help right away if you are not doing well or get worse. Document Released: 08/01/2007 Document Revised: 05/07/2011 Document Reviewed: 08/01/2007 [facility] Patient Information 2014 [place], [place].

## 2013-08-03 NOTE — ED Notes (Signed)
Patient with increased swelling in his lower legs and feet.  Patient states he is having a hard time walking due to the swelling.  Patient denies any CHF history.  Patient states he noticed this starting approximately one week ago.  Patient denies any shortness of breath.

## 2013-08-03 NOTE — ED Provider Notes (Signed)
CSN: [id number]     Arrival date & time 08/03/13  1850 History   First MD Initiated Contact with Patient 08/03/13 2258     Chief Complaint  Patient presents with  . Leg Swelling     (Consider location/radiation/quality/duration/timing/severity/associated sxs/prior Treatment) HPI Complains of bilateral leg edema for one week. Swelling is worse at the end of the day improved after bedrest and with soaking in Epsom salts. He complains of bilateral lower leg pain with walking. No fever. No trauma. No fever No shortness of breath. No chest pain. No other associated symptoms. Pain is nonradiating, mild Past Medical History  Diagnosis Date  . Diabetes mellitus   . Chronic pain   . Hypertension   . Hyperlipidemia   . Sleep apnea    Past Surgical History  Procedure Laterality Date  . Colon surgery    . Cataract extraction w/ intraocular lens implant Left 06/2012    laser for posterior capsule?   History reviewed. No pertinent family history. History  Substance Use Topics  . Smoking status: Never Smoker   . Smokeless tobacco: Not on file  . Alcohol Use: No   occasional alcohol use, no illicit drug use  Review of Systems  Constitutional: Negative.   HENT: Negative.   Respiratory: Negative.   Cardiovascular: Positive for leg swelling.  Gastrointestinal: Negative.   Musculoskeletal: Positive for gait problem.       Walks with cane  Skin: Negative.        toenails poorly cared for  Neurological: Negative.   Psychiatric/Behavioral: Negative.   All other systems reviewed and are negative.     Allergies  Review of patient's allergies indicates no known allergies.  Home Medications   Prior to Admission medications   Medication Sig Start Date End Date Taking? Authorizing Provider  atorvastatin (LIPITOR) 20 MG tablet Take 1 tablet (20 mg total) by mouth daily. 04/08/13  Yes Kandis Nab, MD  Cholecalciferol (VITAMIN D3) 400 UNITS tablet Take 2 tablets (800 Units total) by  mouth daily. 03/10/12  Yes Vivi Ferns, MD  clonazePAM (KLONOPIN) 0.5 MG tablet Take 0.5 mg by mouth 2 (two) times daily as needed. For anxiety   Yes Historical Provider, MD  cyclobenzaprine (FLEXERIL) 10 MG tablet Take 1 tablet (10 mg total) by mouth 2 (two) times daily as needed for muscle spasms. 07/31/13  Yes Kandis Nab, MD  gabapentin (NEURONTIN) 100 MG capsule Take 1 capsule (100 mg total) by mouth 3 (three) times daily. 07/13/13  Yes Lupita Dawn, MD  glimepiride (AMARYL) 4 MG tablet Take 1 tablet (4 mg total) by mouth daily. 04/08/13  Yes Kandis Nab, MD  HYDROcodone-acetaminophen (NORCO) 7.5-325 MG per tablet Take 1 tablet by mouth 3 (three) times daily as needed for moderate pain. 07/31/13  Yes Kandis Nab, MD  ipratropium (ATROVENT) 0.06 % nasal spray Place 2 sprays into both nostrils 4 (four) times daily. 04/30/13  Yes Kandis Nab, MD  ketoconazole (NIZORAL) 2 % cream Apply 1 application topically daily.   Yes Historical Provider, MD  lisinopril (PRINIVIL,ZESTRIL) 10 MG tablet Take 1 tablet (10 mg total) by mouth daily. 07/31/13  Yes Kandis Nab, MD  metFORMIN (GLUCOPHAGE) 1000 MG tablet Take 1 tablet (1,000 mg total) by mouth 2 (two) times daily with a meal. 01/21/13  Yes Kandis Nab, MD  omeprazole (PRILOSEC) 20 MG capsule Take 1 capsule (20 mg total) by mouth daily. 05/21/13  Yes Kandis Nab, MD  pantoprazole (PROTONIX) 40 MG tablet Take 40 mg by mouth daily.   Yes Historical Provider, MD  potassium chloride SA (K-DUR,KLOR-CON) 20 MEQ tablet Take 2 tablets (40 mEq total) by mouth once. 04/09/13  Yes Kandis Nab, MD  tamsulosin (FLOMAX) 0.4 MG CAPS capsule Take 2 capsules (0.8 mg total) by mouth daily. 01/30/13  Yes Melony Overly, MD  terbinafine (LAMISIL) 250 MG tablet Take 1 tablet (250 mg total) by mouth daily. 04/09/13  Yes Kandis Nab, MD  azithromycin (ZITHROMAX) 250 MG tablet Take 250 mg by mouth daily. Take 500mg  on day 1 and 250mg  on days 2-5.  Patient not sure when he completed course of medication 05/21/13   Kandis Nab, MD  Elastic Bandages & Supports (LUMBAR BACK BRACE/SUPPORT PAD) MISC 1 Device by Does not apply route daily. 10/28/12   Kandis Nab, MD  Misc. Devices (CANE) MISC 1 Device by Does not apply route daily. Please fit one point cane for balance and support 10/28/12   Kandis Nab, MD   BP 155/83  Pulse 79  Temp(Src) 98.5 F (36.9 C) (Oral)  Resp 15  Ht 5\' 9"  (1.753 m)  Wt 217 lb (98.431 kg)  BMI 32.03 kg/m2  SpO2 99% Physical Exam  Nursing note and vitals reviewed. Constitutional: He appears well-developed and well-nourished.  HENT:  Head: Normocephalic and atraumatic.  Eyes: Conjunctivae are normal. Pupils are equal, round, and reactive to light.  Neck: Neck supple. No JVD present. No tracheal deviation present. No thyromegaly present.  Cardiovascular: Normal rate and regular rhythm.   No murmur heard. Pulmonary/Chest: Effort normal and breath sounds normal.  Abdominal: Soft. Bowel sounds are normal. He exhibits no distension. There is no tenderness.  Musculoskeletal: Normal range of motion. He exhibits edema. He exhibits no tenderness.  1+ pretibial pitting edema bilaterally. Toenails with chronic appearing fungal infection and poorly trimmed. DP pulses 2+ bilaterally.  Neurological: He is alert. No cranial nerve deficit. Coordination normal.  Walks unassisted, without difficulty  Skin: Skin is warm and dry. No rash noted.  Psychiatric: He has a normal mood and affect.    ED Course  Procedures (including critical care time) Labs Review Labs Reviewed  I-STAT CHEM 8, ED - Abnormal; Notable for the following:    Potassium 3.1 (*)    Creatinine, Ser 1.50 (*)    Calcium, Ion 1.32 (*)    All other components within normal limits  CBC WITH DIFFERENTIAL  PRO B NATRIURETIC PEPTIDE    Imaging Review Dg Chest 2 View  08/03/2013   CLINICAL DATA:  Leg swelling.  EXAM: CHEST  2 VIEW  COMPARISON:  PA  and lateral chest 02/22/2012.  FINDINGS: The lungs are clear. No pneumothorax or pleural effusion. heart size and mediastinal contours are within normal limits. The visualized skeletal structures are unremarkable.  IMPRESSION: No acute disease.   Electronically Signed   By: Inge Rise M.D.   On: 08/03/2013 20:47     EKG Interpretation None     Chest xray viewed by me MDM   Final diagnoses:  None   We'll not prescribe diuretic as patient has mild renal insufficiency and mild hypokalemia. He has an appointment scheduled for [facility] for tomorrow 11 AM. He is advised to elevate his leg and cut down on salt. Diagnosis #1 peripheral edema #2 hypokalemia #3 renal insufficiency     Orlie Dakin, MD 08/03/13 2320

## 2013-08-03 NOTE — Assessment & Plan Note (Signed)
-   await orthopedic referral  - refilled vicodin and flexeril - continue gabapentin at 100mg  bid. Hesitate to increase dose since patient reports somnolence with it.

## 2013-08-03 NOTE — Assessment & Plan Note (Signed)
Located on left posterior shoulder. This could be a hypertrophic muscle from muscle spasms or a lipoma. - will get [place] to better define it

## 2013-08-03 NOTE — Assessment & Plan Note (Signed)
Patient requesting podiatry referral for trimming of toenails which are getting long and difficult to cut.  - podiatry referral placed

## 2013-08-03 NOTE — Progress Notes (Signed)
Patient ID: Tyler Lester    DOB: 1945-06-17, 68 y.o.   MRN: [id number] --- Subjective:  Tyler Lester is a 68 y.o.male who presents for follow up on neck pain. He was seen at the clinic for neck pain on 07/13/13 and MRI was done which showed central canal stenosis at levels C4 through C6 as well as central protrusion at C3-4. Since then, he has been taking vicodin 7.5/325 three times a day which helps with the pain but doesn't completely control it. He continues to have difficulty moving his neck around secondary to pain. Has also has been taking gabapentin twice a day but this makes his sleepy. He reports numbness and tingling in both hands, but this has improved since starting the gabapentin. He has also had some associated weakness in the left hand more than the right.   ROS: see HPI Past Medical History: reviewed and updated medications and allergies. Social History: Tobacco: none  Objective: Filed Vitals:   07/31/13 1204  BP: 138/76  Pulse: 89  Temp: 97.7 F (36.5 C)    Physical Examination:   General appearance - alert, well appearing, and in no distress Neck - tenderness to palpation along cervical spine, neck range of motion limited secondary to pain, negative Spurling's, increased spasticity along left trapezius muscle Decreased grip strength on left compared to right, 5/5 deltoid, biceps and triceps strength Chest - clear to auscultation, no wheezes, rales or rhonchi, symmetric air entry Heart - normal rate, regular rhythm, normal S1, S2, no murmurs Skin - soft tissue round mass 2cmx2cm on the posterior aspect of the left shoulder, tender to palpation.    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 The neural foramina are severely  narrowed at both of  these levels.  Central protrusion at C3-4 contacts and causes some deformity of the  cord.  Disc bulges C6-7 causes mild deformity of the ventral cord.

## 2013-08-07 ENCOUNTER — Ambulatory Visit (HOSPITAL_COMMUNITY)
Admission: RE | Admit: 2013-08-07 | Discharge: 2013-08-07 | Disposition: A | Discharge status: Home / Self Care | Payer: PRIVATE HEALTH INSURANCE | Source: Ambulatory Visit | Attending: Family Medicine | Admitting: Family Medicine

## 2013-08-07 ENCOUNTER — Other Ambulatory Visit: Payer: Self-pay | Admitting: Family Medicine

## 2013-08-07 DIAGNOSIS — R220 Localized swelling, mass and lump, head: Secondary | ICD-10-CM | POA: Insufficient documentation

## 2013-08-07 DIAGNOSIS — R221 Localized swelling, mass and lump, neck: Principal | ICD-10-CM

## 2013-08-07 DIAGNOSIS — M7989 Other specified soft tissue disorders: Secondary | ICD-10-CM

## 2013-08-19 ENCOUNTER — Ambulatory Visit: Payer: PRIVATE HEALTH INSURANCE | Admitting: Family Medicine

## 2013-08-24 ENCOUNTER — Encounter: Payer: Self-pay | Admitting: Family Medicine

## 2013-08-24 ENCOUNTER — Ambulatory Visit (INDEPENDENT_AMBULATORY_CARE_PROVIDER_SITE_OTHER): Payer: PRIVATE HEALTH INSURANCE | Admitting: Family Medicine

## 2013-08-24 VITALS — BP 165/90 | HR 101 | Ht 69.0 in | Wt 210.0 lb

## 2013-08-24 DIAGNOSIS — R809 Proteinuria, unspecified: Secondary | ICD-10-CM

## 2013-08-24 DIAGNOSIS — M47812 Spondylosis without myelopathy or radiculopathy, cervical region: Secondary | ICD-10-CM

## 2013-08-24 DIAGNOSIS — I10 Essential (primary) hypertension: Secondary | ICD-10-CM

## 2013-08-24 DIAGNOSIS — L603 Nail dystrophy: Secondary | ICD-10-CM

## 2013-08-24 DIAGNOSIS — L608 Other nail disorders: Secondary | ICD-10-CM

## 2013-08-24 DIAGNOSIS — R600 Localized edema: Secondary | ICD-10-CM

## 2013-08-24 DIAGNOSIS — M7989 Other specified soft tissue disorders: Secondary | ICD-10-CM

## 2013-08-24 DIAGNOSIS — R609 Edema, unspecified: Secondary | ICD-10-CM

## 2013-08-24 LAB — POCT URINALYSIS DIPSTICK
Bilirubin, UA: NEGATIVE
Glucose, UA: NEGATIVE
Ketones, UA: NEGATIVE
Leukocytes, UA: NEGATIVE
Nitrite, UA: NEGATIVE
Protein, UA: >=300
Spec Grav, UA: 1.025
Urobilinogen, UA: 1
pH, UA: 6

## 2013-08-24 LAB — POCT UA - MICROSCOPIC ONLY

## 2013-08-24 MED ORDER — HYDROCODONE-ACETAMINOPHEN 7.5-325 MG PO TABS: 1.0000 | ORAL_TABLET | Freq: Three times a day (TID) | ORAL | Status: DC | PRN

## 2013-08-24 MED ORDER — OMEPRAZOLE 20 MG PO CPDR: 20.0000 mg | DELAYED_RELEASE_CAPSULE | Freq: Every day | ORAL | Status: DC

## 2013-08-24 MED ORDER — METFORMIN HCL 1000 MG PO TABS: 1000.0000 mg | ORAL_TABLET | Freq: Two times a day (BID) | ORAL | Status: DC

## 2013-08-24 MED ORDER — LISINOPRIL 10 MG PO TABS: 10.0000 mg | ORAL_TABLET | Freq: Every day | ORAL | Status: DC

## 2013-08-24 NOTE — Patient Instructions (Signed)
Please make an appointment with the pharmacy clinic for ABI testing. That is to check the circulation in your legs.   Make an appointment with a doctor here in the clinic for Friday to check on your blood pressure and on the swelling in the feet.   For the swelling:  - apply vaseline twice a day to the legs to help with the skin - fill the prescription for the compression stockings and wear the compression stockings as often as possible - keep your legs elevated as much as you can  We'll follow up with you with the labwork.   For the bump on the shoulder, I'm going to send a referral for surgery.

## 2013-08-25 ENCOUNTER — Telehealth: Payer: Self-pay | Admitting: Family Medicine

## 2013-08-25 DIAGNOSIS — N183 Chronic kidney disease, stage 3 unspecified: Secondary | ICD-10-CM | POA: Insufficient documentation

## 2013-08-25 DIAGNOSIS — N182 Chronic kidney disease, stage 2 (mild): Secondary | ICD-10-CM | POA: Insufficient documentation

## 2013-08-25 LAB — BASIC METABOLIC PANEL
BUN: 11 mg/dL (ref 6–23)
CO2: 31 mEq/L (ref 19–32)
Calcium: 9.1 mg/dL (ref 8.4–10.5)
Chloride: 102 mEq/L (ref 96–112)
Creat: 1.11 mg/dL (ref 0.50–1.35)
Glucose, Bld: 97 mg/dL (ref 70–99)
Potassium: 3.2 mEq/L — ABNORMAL LOW (ref 3.5–5.3)
Sodium: 142 mEq/L (ref 135–145)

## 2013-08-25 LAB — TSH: TSH: 1.385 u[IU]/mL (ref 0.350–4.500)

## 2013-08-25 MED ORDER — POTASSIUM CHLORIDE CRYS ER 20 MEQ PO TBCR: 60.0000 meq | EXTENDED_RELEASE_TABLET | Freq: Once | ORAL | Status: DC

## 2013-08-25 NOTE — Assessment & Plan Note (Signed)
Found to have >300 protein in urine.  - check pr/cr ratio at follow up visit.

## 2013-08-25 NOTE — Assessment & Plan Note (Addendum)
Possibly venous stasis. Bilateral. Doesn't appear to be DVT. Patient does have a history of diastolic heart failure grade 1 but pro BNP in the ED was normal, making heart failure exacerbation less likely.  Difficulty finding pulse and so recommended ABI testing to establish any possible component of PVD.  - use gentle compression with 15-72mmHg compression stockings.  - use vaseline bid on legs to help with skin changes and breakdown - check UA for protein - recheck BMP for Creatinine and K - follow up at end of week - hold on lasix since renal insufficiency and low K

## 2013-08-25 NOTE — Assessment & Plan Note (Signed)
Refilled vicodin.  

## 2013-08-25 NOTE — Assessment & Plan Note (Signed)
-

## 2013-08-25 NOTE — Assessment & Plan Note (Signed)
Trimmed nails in office today.  Patient has appointment with podiatry in 1 month

## 2013-08-25 NOTE — Progress Notes (Signed)
Patient ID: Tyler Lester    DOB: 1945-12-18, 68 y.o.   MRN: [id number] --- Subjective:  Tyler Lester is a 68 y.o.male who presents with lower extremity swelling. He states that it started 2-3 weeks ago. It has not gotten any worst. It is bilateral. He doesn't think it gets better in the morning or with elevation. He denies any leg pain. He has had some associated skin changes. No shortness of breath, no history of clots. No chest pain. No palpitations.  He missed his morning dose of lisinopril.   He was seen in the ED on 08/03/13 for leg swelling and was found to have a creatinine of 1.5 and a K of 3.1.  ROS: see HPI Past Medical History: reviewed and updated medications and allergies. Social History: Tobacco: none  Objective: Filed Vitals:   08/24/13 1605  BP: 165/90  Pulse: 101    Physical Examination:   General appearance - alert, well appearing, and in no distress Chest - clear to auscultation, no wheezes, rales or rhonchi, symmetric air entry Heart - mildly tachycardic at 100HR, regular rhythm,  normal S1, S2, no murmurs Extremities - dorsalis pedis pulses difficult to palpate, shiny and thin skin with venous stasis changes, 2+ pitting edema bilaterally, long, thick toenails bilaterally  Toenail trimming: Trimmed toenails with large nail clippers. Patient tolerated procedure well.

## 2013-08-25 NOTE — Assessment & Plan Note (Signed)
Uncontrolled today due to missing dose. History of low BP in the past, so do not want to overtreat.  - refilled lisinopril 10mg  daily - follow up at the end of the week

## 2013-08-25 NOTE — Telephone Encounter (Signed)
Please call patient to let him know that his potassium was low and I would like for him to take 3 tablets of potassium which I will be sending to the pharmacy.  Please make sure he sees a doctor for follow up at the end of the week and also makes an appointment with Dr. Valentina Lucks for ABI testing.   Thank you!  Liam Graham, PGY-3 Family Medicine Resident

## 2013-08-25 NOTE — Telephone Encounter (Signed)
Called x2, each time received recording that person was not accepting calls at the time. Will send letter to patient.

## 2013-09-03 ENCOUNTER — Ambulatory Visit: Payer: Self-pay | Admitting: Podiatry

## 2013-09-15 ENCOUNTER — Encounter (HOSPITAL_COMMUNITY): Payer: Self-pay | Admitting: Emergency Medicine

## 2013-09-15 ENCOUNTER — Other Ambulatory Visit: Payer: Self-pay | Admitting: Orthopedic Surgery

## 2013-09-15 ENCOUNTER — Emergency Department (HOSPITAL_COMMUNITY)
Admission: EM | Admit: 2013-09-15 | Discharge: 2013-09-16 | Disposition: A | Discharge status: Home / Self Care | Payer: PRIVATE HEALTH INSURANCE | Attending: Emergency Medicine | Admitting: Emergency Medicine

## 2013-09-15 DIAGNOSIS — G8929 Other chronic pain: Secondary | ICD-10-CM | POA: Diagnosis not present

## 2013-09-15 DIAGNOSIS — Z79899 Other long term (current) drug therapy: Secondary | ICD-10-CM | POA: Diagnosis not present

## 2013-09-15 DIAGNOSIS — E119 Type 2 diabetes mellitus without complications: Secondary | ICD-10-CM | POA: Diagnosis not present

## 2013-09-15 DIAGNOSIS — R42 Dizziness and giddiness: Secondary | ICD-10-CM

## 2013-09-15 DIAGNOSIS — E785 Hyperlipidemia, unspecified: Secondary | ICD-10-CM | POA: Diagnosis not present

## 2013-09-15 DIAGNOSIS — I10 Essential (primary) hypertension: Secondary | ICD-10-CM | POA: Insufficient documentation

## 2013-09-15 LAB — CBC WITH DIFFERENTIAL/PLATELET
Basophils Absolute: 0 10*3/uL (ref 0.0–0.1)
Basophils Relative: 0 % (ref 0–1)
Eosinophils Absolute: 0.1 10*3/uL (ref 0.0–0.7)
Eosinophils Relative: 1 % (ref 0–5)
HCT: 46.2 % (ref 39.0–52.0)
Hemoglobin: 15.4 g/dL (ref 13.0–17.0)
Lymphocytes Relative: 27 % (ref 12–46)
Lymphs Abs: 2.2 10*3/uL (ref 0.7–4.0)
MCH: 29.2 pg (ref 26.0–34.0)
MCHC: 33.3 g/dL (ref 30.0–36.0)
MCV: 87.7 fL (ref 78.0–100.0)
Monocytes Absolute: 0.7 10*3/uL (ref 0.1–1.0)
Monocytes Relative: 9 % (ref 3–12)
Neutro Abs: 5.2 10*3/uL (ref 1.7–7.7)
Neutrophils Relative %: 63 % (ref 43–77)
Platelets: 203 10*3/uL (ref 150–400)
RBC: 5.27 MIL/uL (ref 4.22–5.81)
RDW: 12.8 % (ref 11.5–15.5)
WBC: 8.3 10*3/uL (ref 4.0–10.5)

## 2013-09-15 LAB — COMPREHENSIVE METABOLIC PANEL
ALT: 12 U/L (ref 0–53)
AST: 18 U/L (ref 0–37)
Albumin: 3.6 g/dL (ref 3.5–5.2)
Alkaline Phosphatase: 59 U/L (ref 39–117)
Anion gap: 16 — ABNORMAL HIGH (ref 5–15)
BUN: 14 mg/dL (ref 6–23)
CO2: 25 mEq/L (ref 19–32)
Calcium: 9 mg/dL (ref 8.4–10.5)
Chloride: 101 mEq/L (ref 96–112)
Creatinine, Ser: 1.3 mg/dL (ref 0.50–1.35)
GFR calc Af Amer: 64 mL/min — ABNORMAL LOW (ref >90)
GFR calc non Af Amer: 56 mL/min — ABNORMAL LOW (ref >90)
Glucose, Bld: 86 mg/dL (ref 70–99)
Potassium: 3.2 mEq/L — ABNORMAL LOW (ref 3.7–5.3)
Sodium: 142 mEq/L (ref 137–147)
Total Bilirubin: 0.4 mg/dL (ref 0.3–1.2)
Total Protein: 7.5 g/dL (ref 6.0–8.3)

## 2013-09-15 MED ORDER — OXYCODONE-ACETAMINOPHEN 5-325 MG PO TABS
1.0000 | ORAL_TABLET | Freq: Once | ORAL | Status: AC
  Administered 2013-09-15: 1 via ORAL
  Filled 2013-09-15: qty 1

## 2013-09-15 NOTE — ED Notes (Signed)
MD at bedside. 

## 2013-09-15 NOTE — ED Notes (Signed)
Pt reports throat is sore and requesting pain pill.

## 2013-09-15 NOTE — ED Notes (Signed)
Pt placed in bed and in gown. Pt monitored by pulse ox, bp cuff, and 5-lead.

## 2013-09-15 NOTE — ED Notes (Signed)
Pt. reports dizziness , lightheaded , jaw and left hand shaking onset 2 days ago , alert and oriented ,respirations unlabored , speech clear /no facial asymmetry , no arm drift.

## 2013-09-15 NOTE — ED Provider Notes (Signed)
CSN: [id number]     Arrival date & time 09/15/13  1926 History   First MD Initiated Contact with Patient 09/15/13 2336     Chief Complaint  Patient presents with  . Dizziness     (Consider location/radiation/quality/duration/timing/severity/associated sxs/prior Treatment) Patient is a 68 y.o. male presenting with dizziness. The history is provided by the patient.  Dizziness Quality:  Lightheadedness Severity:  Mild Onset quality:  Gradual Duration:  2 days Timing:  Constant Progression:  Unchanged Chronicity:  Recurrent Context: not when bending over, not with ear pain and not with loss of consciousness   Context comment:  Sore throat Relieved by:  Nothing Worsened by:  Nothing tried Ineffective treatments:  None tried Associated symptoms: no blood in stool, no chest pain, no diarrhea, no hearing loss, no nausea, no palpitations, no shortness of breath, no tinnitus, no vomiting and no weakness   Risk factors: no Meniere's disease     Past Medical History  Diagnosis Date  . Diabetes mellitus   . Chronic pain   . Hypertension   . Hyperlipidemia   . Sleep apnea    Past Surgical History  Procedure Laterality Date  . Colon surgery    . Cataract extraction w/ intraocular lens implant Left 06/2012    laser for posterior capsule?   No family history on file. History  Substance Use Topics  . Smoking status: Never Smoker   . Smokeless tobacco: Not on file  . Alcohol Use: No    Review of Systems  Constitutional: Negative for fever.  HENT: Negative for ear discharge, hearing loss, rhinorrhea, sinus pressure, tinnitus, trouble swallowing and voice change.   Eyes: Negative for photophobia.  Respiratory: Negative for chest tightness and shortness of breath.   Cardiovascular: Negative for chest pain, palpitations and leg swelling.  Gastrointestinal: Negative for nausea, vomiting, diarrhea and blood in stool.  Neurological: Positive for dizziness and light-headedness. Negative  for facial asymmetry, weakness and numbness.  All other systems reviewed and are negative.     Allergies  Review of patient's allergies indicates no known allergies.  Home Medications   Prior to Admission medications   Medication Sig Start Date End Date Taking? Authorizing Provider  atorvastatin (LIPITOR) 20 MG tablet Take 1 tablet (20 mg total) by mouth daily. 04/08/13   Kandis Nab, MD  Cholecalciferol (VITAMIN D3) 400 UNITS tablet Take 2 tablets (800 Units total) by mouth daily. 03/10/12   Vivi Ferns, MD  clonazePAM (KLONOPIN) 0.5 MG tablet Take 0.5 mg by mouth 2 (two) times daily as needed. For anxiety    Historical Provider, MD  cyclobenzaprine (FLEXERIL) 10 MG tablet Take 1 tablet (10 mg total) by mouth 2 (two) times daily as needed for muscle spasms. 07/31/13   Kandis Nab, MD  Elastic Bandages & Supports (LUMBAR BACK BRACE/SUPPORT PAD) MISC 1 Device by Does not apply route daily. 10/28/12   Kandis Nab, MD  gabapentin (NEURONTIN) 100 MG capsule Take 1 capsule (100 mg total) by mouth 3 (three) times daily. 07/13/13   Lupita Dawn, MD  glimepiride (AMARYL) 4 MG tablet Take 1 tablet (4 mg total) by mouth daily. 04/08/13   Kandis Nab, MD  HYDROcodone-acetaminophen (NORCO) 7.5-325 MG per tablet Take 1 tablet by mouth 3 (three) times daily as needed for moderate pain. 08/24/13   Kandis Nab, MD  ipratropium (ATROVENT) 0.06 % nasal spray Place 2 sprays into both nostrils 4 (four) times daily. 04/30/13   Kandis Nab,  MD  ketoconazole (NIZORAL) 2 % cream Apply 1 application topically daily.    Historical Provider, MD  lisinopril (PRINIVIL,ZESTRIL) 10 MG tablet Take 1 tablet (10 mg total) by mouth daily. 08/24/13   Kandis Nab, MD  metFORMIN (GLUCOPHAGE) 1000 MG tablet Take 1 tablet (1,000 mg total) by mouth 2 (two) times daily with a meal. 08/24/13   Kandis Nab, MD  Misc. Devices (CANE) MISC 1 Device by Does not apply route daily. Please fit one point cane for  balance and support 10/28/12   Kandis Nab, MD  omeprazole (PRILOSEC) 20 MG capsule Take 1 capsule (20 mg total) by mouth daily. 08/24/13   Kandis Nab, MD  pantoprazole (PROTONIX) 40 MG tablet Take 40 mg by mouth daily.    Historical Provider, MD  potassium chloride SA (K-DUR,KLOR-CON) 20 MEQ tablet Take 3 tablets (60 mEq total) by mouth once. 08/25/13   Kandis Nab, MD  tamsulosin (FLOMAX) 0.4 MG CAPS capsule Take 2 capsules (0.8 mg total) by mouth daily. 01/30/13   Melony Overly, MD  terbinafine (LAMISIL) 250 MG tablet Take 1 tablet (250 mg total) by mouth daily. 04/09/13   Kandis Nab, MD   BP 128/81  Pulse 82  Temp(Src) 97.8 F (36.6 C) (Oral)  Resp 14  Wt 201 lb 1 oz (91.201 kg)  SpO2 98% Physical Exam  Constitutional: He is oriented to person, place, and time. He appears well-developed and well-nourished. No distress.  HENT:  Head: Normocephalic and atraumatic.  Mouth/Throat: Oropharynx is clear and moist.  Eyes: Conjunctivae are normal. Pupils are equal, round, and reactive to light.  Neck: Normal range of motion. Neck supple.  Cardiovascular: Normal rate, regular rhythm and intact distal pulses.   Pulmonary/Chest: Effort normal and breath sounds normal. He has no wheezes. He has no rales.  Abdominal: Soft. Bowel sounds are normal. There is no tenderness. There is no rebound and no guarding.  Musculoskeletal: Normal range of motion. He exhibits no edema and no tenderness.  Neurological: He is alert and oriented to person, place, and time. He has normal reflexes. He displays normal reflexes. No cranial nerve deficit. He exhibits normal muscle tone. Coordination normal.  No tremors  Skin: Skin is warm and dry.  Psychiatric: He has a normal mood and affect.    ED Course  Procedures (including critical care time) Labs Review Labs Reviewed  COMPREHENSIVE METABOLIC PANEL - Abnormal; Notable for the following:    Potassium 3.2 (*)    GFR calc non Af Amer 56 (*)     GFR calc Af Amer 64 (*)    Anion gap 16 (*)    All other components within normal limits  RAPID STREP SCREEN  CBC WITH DIFFERENTIAL  I-STAT TROPOININ, ED    Imaging Review No results found.   EKG Interpretation None      MDM   Final diagnoses:  None     Date: 09/16/2013  Rate: 79  Rhythm: normal sinus rhythm  QRS Axis: normal  Intervals: normal  ST/T Wave abnormalities: normal  Conduction Disutrbances: none  Narrative Interpretation: unremarkable      See by family practice follow up set up.  No acute conditions today  Doreather Hoxworth K Seabron Iannello-Rasch, MD 09/16/13 [phone]

## 2013-09-16 ENCOUNTER — Emergency Department (HOSPITAL_COMMUNITY): Payer: PRIVATE HEALTH INSURANCE

## 2013-09-16 ENCOUNTER — Encounter (HOSPITAL_COMMUNITY): Payer: Self-pay | Admitting: Emergency Medicine

## 2013-09-16 DIAGNOSIS — R42 Dizziness and giddiness: Secondary | ICD-10-CM | POA: Diagnosis not present

## 2013-09-16 LAB — I-STAT TROPONIN, ED: Troponin i, poc: 0.01 ng/mL (ref 0.00–0.08)

## 2013-09-16 LAB — RAPID STREP SCREEN (MED CTR MEBANE ONLY): Streptococcus, Group A Screen (Direct): NEGATIVE

## 2013-09-16 NOTE — ED Notes (Signed)
PT returned from scans. Pt monitored by bp cuff, 12-lead, and pulse ox.

## 2013-09-17 ENCOUNTER — Encounter: Payer: Self-pay | Admitting: Family Medicine

## 2013-09-17 ENCOUNTER — Ambulatory Visit: Payer: PRIVATE HEALTH INSURANCE | Admitting: Family Medicine

## 2013-09-17 ENCOUNTER — Ambulatory Visit (INDEPENDENT_AMBULATORY_CARE_PROVIDER_SITE_OTHER): Payer: PRIVATE HEALTH INSURANCE | Admitting: Family Medicine

## 2013-09-17 VITALS — BP 149/83 | HR 91 | Temp 98.1°F | Wt 199.0 lb

## 2013-09-17 DIAGNOSIS — R059 Cough, unspecified: Secondary | ICD-10-CM

## 2013-09-17 DIAGNOSIS — G8929 Other chronic pain: Secondary | ICD-10-CM

## 2013-09-17 DIAGNOSIS — R05 Cough: Secondary | ICD-10-CM

## 2013-09-17 LAB — CULTURE, GROUP A STREP

## 2013-09-17 MED ORDER — HYDROCODONE-ACETAMINOPHEN 7.5-325 MG PO TABS: 1.0000 | ORAL_TABLET | Freq: Three times a day (TID) | ORAL | Status: DC | PRN

## 2013-09-17 MED ORDER — BENZONATATE 100 MG PO CAPS: 100.0000 mg | ORAL_CAPSULE | Freq: Three times a day (TID) | ORAL | Status: DC | PRN

## 2013-09-17 NOTE — Patient Instructions (Signed)
Great to meet you!  Make an appt today with Dr. Lonny Prude for next month.   Cough, Adult  A cough is a reflex that helps clear your throat and airways. It can help heal the body or may be a reaction to an irritated airway. A cough may only last 2 or 3 weeks (acute) or may last more than 8 weeks (chronic).  CAUSES Acute cough:  Viral or bacterial infections. Chronic cough:  Infections.  Allergies.  Asthma.  Post-nasal drip.  Smoking.  Heartburn or acid reflux.  Some medicines.  Chronic lung problems (COPD).  Cancer. SYMPTOMS   Cough.  Fever.  Chest pain.  Increased breathing rate.  High-pitched whistling sound when breathing (wheezing).  Colored mucus that you cough up (sputum). TREATMENT   A bacterial cough may be treated with antibiotic medicine.  A viral cough must run its course and will not respond to antibiotics.  Your caregiver may recommend other treatments if you have a chronic cough. HOME CARE INSTRUCTIONS   Only take over-the-counter or prescription medicines for pain, discomfort, or fever as directed by your caregiver. Use cough suppressants only as directed by your caregiver.  Use a cold steam vaporizer or humidifier in your bedroom or home to help loosen secretions.  Sleep in a semi-upright position if your cough is worse at night.  Rest as needed.  Stop smoking if you smoke. SEEK IMMEDIATE MEDICAL CARE IF:   You have pus in your sputum.  Your cough starts to worsen.  You cannot control your cough with suppressants and are losing sleep.  You begin coughing up blood.  You have difficulty breathing.  You develop pain which is getting worse or is uncontrolled with medicine.  You have a fever. MAKE SURE YOU:   Understand these instructions.  Will watch your condition.  Will get help right away if you are not doing well or get worse. Document Released: 08/11/2010 Document Revised: 05/07/2011 Document Reviewed:  08/11/2010 [facility] Patient Information 2015 [place], [place]. This information is not intended to replace advice given to you by your health care provider. Make sure you discuss any questions you have with your health care provider.

## 2013-09-17 NOTE — Assessment & Plan Note (Signed)
Controlled Seen in place of his PCP this overbooked today, advised that he'll need to see him next month For chronic neck and back pain Refill Vicodin for one month, 7.5 #90

## 2013-09-17 NOTE — Assessment & Plan Note (Signed)
Cough and congestion Seen in the ED 2 days ago for this, chest x-ray was negative there Normal vitals, chest clear, respirations nonlabored Requests Tylenol with codeine, however I declined to prescribe this due to giving him Vicodin as well. Tessalon Perles for cough Followup PCP as needed, red flags reviewed in detail provided for return care

## 2013-09-17 NOTE — Progress Notes (Signed)
Patient ID: Tyler Lester, male   DOB: Feb 22, 1946, 68 y.o.   MRN: [id number]  [name], MD Phone: [phone]  Subjective:  Chief complaint-noted  Pt Here for cough, congestion, refill on chronic pain meds  Chronic pain Takes up to 3 Vicodin per day for chronic neck and back pain. His PCP was overbooked today and he could get appointment.  Cough congestion Onset 2 days ago, has been seen in the ED the negative chest x-ray since that time. Does not like the ED gave him any meds that I can tell other than one Percocet while he was there. He is requesting Tylenol with codeine States that his cough is productive of green sputum, denies dyspnea or chest pain. Appetite normal, no fever or chills  ROS-  Per history of present illness  Past Medical History Patient Active Problem List   Diagnosis Date Noted  . Proteinuria 08/25/2013  . Soft tissue mass 08/03/2013  . Cervical spine degeneration 07/13/2013  . Tremor 05/23/2013  . Acute renal failure 05/08/2013  . Chronic diastolic heart failure 05/39/7673  . Acute upper respiratory infections of unspecified site 04/30/2013  . Gait instability 02/21/2013  . Knee pain, bilateral 12/12/2012  . Neck pain on right side 07/14/2012  . Bilateral lower extremity edema 05/14/2012  . Nail dystrophy 05/14/2012  . Neck pain on left side 04/25/2012  . Cough 03/27/2012  . Urinary retention 02/09/2012  . Hearing loss 12/18/2011  . Right hip pain 12/18/2011  . Fatigue 07/05/2011  . GERD (gastroesophageal reflux disease) 07/04/2011  . 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 Diabetes mellitus type 2 with neurological manifestations 01/05/2011  . HTN (hypertension) 01/05/2011  . Chronic pain 01/05/2011  . Anxiety 01/05/2011    Medications- reviewed and updated Current Outpatient Prescriptions  Medication Sig Dispense Refill  . atorvastatin (LIPITOR) 20 MG tablet Take 1 tablet (20 mg total) by mouth daily.  90 tablet  3  . benzonatate  (TESSALON PERLES) 100 MG capsule Take 1 capsule (100 mg total) by mouth 3 (three) times daily as needed for cough.  20 capsule  0  . Cholecalciferol (VITAMIN D3) 400 UNITS tablet Take 2 tablets (800 Units total) by mouth daily.  60 tablet  1  . clonazePAM (KLONOPIN) 0.5 MG tablet Take 0.5 mg by mouth 2 (two) times daily as needed. For anxiety      . cyclobenzaprine (FLEXERIL) 10 MG tablet Take 1 tablet (10 mg total) by mouth 2 (two) times daily as needed for muscle spasms.  60 tablet  1  . Elastic Bandages & Supports (LUMBAR BACK BRACE/SUPPORT PAD) MISC 1 Device by Does not apply route daily.  1 each  0  . gabapentin (NEURONTIN) 100 MG capsule Take 1 capsule (100 mg total) by mouth 3 (three) times daily.  90 capsule  1  . glimepiride (AMARYL) 4 MG tablet Take 1 tablet (4 mg total) by mouth daily.  90 tablet  6  . HYDROcodone-acetaminophen (NORCO) 7.5-325 MG per tablet Take 1 tablet by mouth 3 (three) times daily as needed for moderate pain.  90 tablet  0  . ipratropium (ATROVENT) 0.06 % nasal spray Place 2 sprays into both nostrils 4 (four) times daily.  15 mL  12  . ketoconazole (NIZORAL) 2 % cream Apply 1 application topically daily.      Marland Kitchen lisinopril (PRINIVIL,ZESTRIL) 10 MG tablet Take 1 tablet (10 mg total) by mouth daily.  90 tablet  3  . metFORMIN (GLUCOPHAGE) 1000 MG tablet Take 1  tablet (1,000 mg total) by mouth 2 (two) times daily with a meal.  180 tablet  6  . Misc. Devices (CANE) MISC 1 Device by Does not apply route daily. Please fit one point cane for balance and support  1 each  0  . omeprazole (PRILOSEC) 20 MG capsule Take 1 capsule (20 mg total) by mouth daily.  30 capsule  3  . pantoprazole (PROTONIX) 40 MG tablet Take 40 mg by mouth daily.      . potassium chloride (KLOR-CON) 20 MEQ packet Take 20 mEq by mouth 3 (three) times daily.      . tamsulosin (FLOMAX) 0.4 MG CAPS capsule Take 2 capsules (0.8 mg total) by mouth daily.  60 capsule  1  . terbinafine (LAMISIL) 250 MG tablet  Take 1 tablet (250 mg total) by mouth daily.  45 tablet  0   No current facility-administered medications for this visit.    Objective: BP 149/83  Pulse 91  Temp(Src) 98.1 F (36.7 C) (Oral)  Wt 199 lb (90.266 kg) Gen: NAD, alert, cooperative with exam HEENT: NCAT CV: RRR, good S1/S2, no murmur Resp: CTABL, no wheezes, non-labored Neuro: Alert and oriented, No gross deficits, walks with a cane   Assessment/Plan:  Chronic pain Controlled Seen in place of his PCP this overbooked today, advised that he'll need to see him next month For chronic neck and back pain Refill Vicodin for one month, 7.5 #90    Cough Cough and congestion Seen in the ED 2 days ago for this, chest x-ray was negative there Normal vitals, chest clear, respirations nonlabored Requests Tylenol with codeine, however I declined to prescribe this due to giving him Vicodin as well. Tessalon Perles for cough Followup PCP as needed, red flags reviewed in detail provided for return care     Meds ordered this encounter  Medications  . HYDROcodone-acetaminophen (NORCO) 7.5-325 MG per tablet    Sig: Take 1 tablet by mouth 3 (three) times daily as needed for moderate pain.    Dispense:  90 tablet    Refill:  0  . benzonatate (TESSALON PERLES) 100 MG capsule    Sig: Take 1 capsule (100 mg total) by mouth 3 (three) times daily as needed for cough.    Dispense:  20 capsule    Refill:  0

## 2013-09-22 ENCOUNTER — Ambulatory Visit (INDEPENDENT_AMBULATORY_CARE_PROVIDER_SITE_OTHER): Payer: PRIVATE HEALTH INSURANCE | Admitting: Family Medicine

## 2013-09-22 ENCOUNTER — Other Ambulatory Visit: Payer: Self-pay | Admitting: Orthopedic Surgery

## 2013-09-22 ENCOUNTER — Encounter: Payer: Self-pay | Admitting: Family Medicine

## 2013-09-22 VITALS — BP 110/60 | HR 89 | Temp 98.1°F | Ht 69.0 in | Wt 204.0 lb

## 2013-09-22 DIAGNOSIS — R05 Cough: Secondary | ICD-10-CM

## 2013-09-22 DIAGNOSIS — M940 Chondrocostal junction syndrome [Tietze]: Secondary | ICD-10-CM

## 2013-09-22 DIAGNOSIS — R059 Cough, unspecified: Secondary | ICD-10-CM

## 2013-09-22 DIAGNOSIS — M542 Cervicalgia: Secondary | ICD-10-CM

## 2013-09-22 MED ORDER — DM-GUAIFENESIN ER 30-600 MG PO TB12: 1.0000 | ORAL_TABLET | Freq: Two times a day (BID) | ORAL | Status: DC

## 2013-09-22 MED ORDER — ACETAMINOPHEN-CODEINE #3 300-30 MG PO TABS: 1.0000 | ORAL_TABLET | Freq: Three times a day (TID) | ORAL | Status: DC | PRN

## 2013-09-22 NOTE — Patient Instructions (Signed)
Tyler Lester it was great to see you today!  I am sorry to hear that you are not feeling well. Please start taking the mucinex to help loosen up your cough and clear your congestion. If you coughing is very bad you can also take the tylenol with codeine  I would watch out for the vicodin and codeine together as these can cause significant sedation and drowsiness   Feel better soon! Bernadene Bell, MD

## 2013-09-22 NOTE — Progress Notes (Signed)
Patient ID: ALFONZA TOFT, male   DOB: 29-Jun-1945, 68 y.o.   MRN: [id number]   [facility] Bernadene Bell, MD Phone: [phone]  Subjective:  Mr Gaunce is a 68 y.o M who presents as SDA for persistent cough   # Cough/congestion -org mucus was green, but now mucus is becoming clear -denies SOB, chest pain or congestion  -was at one point seen in the ED for this, chest x-ray was negative there  -saw Dr Wendi Snipes on 09/17/13 who prescribed tessalon and tylenol- was taking tessalon but then ran out -feels that he has to catch his breath when mucus pools in his mouth   All systems were reviewed and were negative unless otherwise noted in the HPI  Past Medical History Patient Active Problem List   Diagnosis Date Noted  . Proteinuria 08/25/2013  . Soft tissue mass 08/03/2013  . Cervical spine degeneration 07/13/2013  . Tremor 05/23/2013  . Acute renal failure 05/08/2013  . Chronic diastolic heart failure 73/40/3709  . Acute upper respiratory infections of unspecified site 04/30/2013  . Gait instability 02/21/2013  . Knee pain, bilateral 12/12/2012  . Neck pain on right side 07/14/2012  . Bilateral lower extremity edema 05/14/2012  . Nail dystrophy 05/14/2012  . Neck pain on left side 04/25/2012  . Cough 03/27/2012  . Urinary retention 02/09/2012  . Hearing loss 12/18/2011  . Right hip pain 12/18/2011  . Fatigue 07/05/2011  . GERD (gastroesophageal reflux disease) 07/04/2011  . 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 Diabetes mellitus type 2 with neurological manifestations 01/05/2011  . HTN (hypertension) 01/05/2011  . Chronic pain 01/05/2011  . Anxiety 01/05/2011   Reviewed problem list.  Medications- reviewed and updated Chief complaint-noted No additions to family history Social history- patient is a never smoker  Objective: BP 110/60  Pulse 89  Temp(Src) 98.1 F (36.7 C) (Oral)  Ht 5\' 9"  (1.753 m)  Wt 204 lb (92.534 kg)  BMI 30.11 kg/m2  SpO2  98% Gen: NAD, alert, cooperative with exam HEENT: NCAT, EOMI, mildly erythematous pharynx; dry mm; PND present  Neck: FROM, supple CV: RRR, good S1/S2, no murmur, cap refill <3 Resp: CTABL, no wheezes, non-labored Abd: SNTND, BS present, no guarding or organomegaly Ext: No edema, warm, normal tone, moves UE/LE spontaneously  Assessment/Plan: See problem based a/p

## 2013-09-22 NOTE — Assessment & Plan Note (Signed)
Likely viral URI that is continuing to resolve No red flags noted at this time Does have hx of CHF but not volume overloaded  Would benefit from increased fluids Tylenol with codeine (instructed not to take with vicodin) Mucinex  Humidifer or warm shower F/up with PCP if persists

## 2013-09-23 ENCOUNTER — Ambulatory Visit: Payer: PRIVATE HEALTH INSURANCE | Admitting: Family Medicine

## 2013-09-24 ENCOUNTER — Inpatient Hospital Stay (HOSPITAL_COMMUNITY)
Admission: RE | Admit: 2013-09-24 | Discharge: 2013-09-24 | Disposition: A | Discharge status: Home / Self Care | Payer: PRIVATE HEALTH INSURANCE | Source: Ambulatory Visit

## 2013-09-24 NOTE — Pre-Procedure Instructions (Addendum)
Tyler Lester  09/24/2013   Your procedure is scheduled on:  September 30, 2013  Report to [facility] Admitting at 6:30 AM.  Call this number if you have problems the morning of surgery: [phone]   Remember:   Do not eat food or drink liquids after midnight.   Take these medicines the morning of surgery with A SIP OF WATER: dextromethorphan-guaiFENesin ([place] DM), gabapentin (NEURONTIN), omeprazole (PRILOSEC), pantoprazole (PROTONIX), potassium chloride (KLOR-CON),  tamsulosin (FLOMAX), terbinafine (LAMISIL)   Take the following if needed: acetaminophen-codeine (TYLENOL #3) if needed, benzonatate (TESSALON PERLES) if needed, clonazePAM (KLONOPIN) if needed, clonazePAM (KLONOPIN), HYDROcodone-acetaminophen ([place]),      Do not wear jewelry.  Do not wear lotions, powders, or colognes.   Men may shave face and neck only.  Do not bring valuables to the hospital.  [facility] is not responsible  for any belongings or valuables.               Contacts, dentures or bridgework may not be worn into surgery.  Leave suitcase in the car. After surgery it may be brought to your room.  For patients admitted to the hospital, discharge time is determined by your   treatment team.               Patients discharged the day of surgery will not be allowed to drive home.  Name and phone number of your driver:     Please read over the following fact sheets that you were given: Pain Booklet, Coughing and Deep Breathing, Blood Transfusion Information and Surgical Site Infection Prevention

## 2013-09-29 ENCOUNTER — Inpatient Hospital Stay (HOSPITAL_COMMUNITY)
Admission: RE | Admit: 2013-09-29 | Discharge: 2013-09-29 | Disposition: A | Discharge status: Home / Self Care | Payer: PRIVATE HEALTH INSURANCE | Source: Ambulatory Visit

## 2013-09-29 MED ORDER — CEFAZOLIN SODIUM-DEXTROSE 2-3 GM-% IV SOLR: 2.0000 g | INTRAVENOUS | Status: DC

## 2013-09-29 NOTE — Progress Notes (Signed)
Called patient, he missed his PAT appointment, He stated he was cancelling his surgery to another time. Informed patient to notify  Dr Lynann Bologna

## 2013-09-30 ENCOUNTER — Encounter (HOSPITAL_COMMUNITY): Admission: RE | Payer: Self-pay | Source: Ambulatory Visit

## 2013-09-30 ENCOUNTER — Inpatient Hospital Stay (HOSPITAL_COMMUNITY)
Admission: RE | Admit: 2013-09-30 | Payer: PRIVATE HEALTH INSURANCE | Source: Ambulatory Visit | Admitting: Orthopedic Surgery

## 2013-09-30 SURGERY — POSTERIOR CERVICAL FUSION/FORAMINOTOMY LEVEL 4
Anesthesia: General

## 2013-10-14 ENCOUNTER — Ambulatory Visit (INDEPENDENT_AMBULATORY_CARE_PROVIDER_SITE_OTHER): Payer: PRIVATE HEALTH INSURANCE | Admitting: Family Medicine

## 2013-10-14 ENCOUNTER — Encounter: Payer: Self-pay | Admitting: Family Medicine

## 2013-10-14 VITALS — BP 164/106 | HR 78 | Temp 98.0°F | Wt 207.0 lb

## 2013-10-14 DIAGNOSIS — F32A Depression, unspecified: Secondary | ICD-10-CM

## 2013-10-14 DIAGNOSIS — G8929 Other chronic pain: Secondary | ICD-10-CM

## 2013-10-14 DIAGNOSIS — F3289 Other specified depressive episodes: Secondary | ICD-10-CM

## 2013-10-14 DIAGNOSIS — F329 Major depressive disorder, single episode, unspecified: Secondary | ICD-10-CM

## 2013-10-14 DIAGNOSIS — I10 Essential (primary) hypertension: Secondary | ICD-10-CM

## 2013-10-14 MED ORDER — FLUOXETINE HCL 10 MG PO CAPS: 10.0000 mg | ORAL_CAPSULE | Freq: Every day | ORAL | Status: DC

## 2013-10-14 MED ORDER — HYDROCODONE-ACETAMINOPHEN 7.5-325 MG PO TABS: 1.0000 | ORAL_TABLET | Freq: Three times a day (TID) | ORAL | Status: DC | PRN

## 2013-10-14 NOTE — Progress Notes (Signed)
   Subjective:    Patient ID: QUENCY TOBER, male    DOB: 1946/01/30, 68 y.o.   MRN: [id number]  HPI  Chronic pain: Neck and back. Controlled with Vicodin.  Lack of energy: started few years ago. Moving slower. Mood has been depressed for a few years. Sleeps about 6 hours a night with waking up for 30 minutes during the night. Takes naps in the day at times. Normal appetite. Good concentration. Has not taken anything to give him more energy. PHQ-9: 8  Hypertension: Adherent but did not take this morning. No headaches, chest pain, shortness of breath or leg swelling.   History  Substance Use Topics  . Smoking status: Never Smoker   . Smokeless tobacco: Not on file  . Alcohol Use: No    Review of Systems Per HPI    Objective:   Physical Exam  Gen: well appearing male, in no acute distress     Assessment & Plan:

## 2013-10-14 NOTE — Patient Instructions (Addendum)
Tyler Lester, it was a pleasure seeing you today. Today we talked about your lack of energy, your chronic pain and high blood pressure. Please schedule an appointment to see the nurse for a blood pressure check early next week. I have refilled your Norco. Regarding your lack of energy, I will prescribe a medication called Prozac. This is a medication for mood and I think it will help your energy as well.  Please see me in two weeks for follow-up  If you have any questions or concerns, please do not hesitate to call the office at [phone].  Sincerely,  Cordelia Poche, MD

## 2013-10-15 DIAGNOSIS — F329 Major depressive disorder, single episode, unspecified: Secondary | ICD-10-CM | POA: Insufficient documentation

## 2013-10-15 DIAGNOSIS — F32A Depression, unspecified: Secondary | ICD-10-CM | POA: Insufficient documentation

## 2013-10-15 NOTE — Assessment & Plan Note (Signed)
Stable. Continue current regimen. Will need to sign pain contract at next visit.

## 2013-10-15 NOTE — Assessment & Plan Note (Signed)
Appears to be mild. Will try prozac 10mg . Follow-up in two weeks.

## 2013-10-15 NOTE — Assessment & Plan Note (Signed)
Blood pressure not controlled today. Patient states he has not taken his medication yet. Reinforced adherence. Asked to follow-up for nurse visit next week for blood pressure recheck. Will continue current management.

## 2013-10-29 ENCOUNTER — Ambulatory Visit: Payer: PRIVATE HEALTH INSURANCE | Admitting: Family Medicine

## 2013-11-06 ENCOUNTER — Other Ambulatory Visit: Payer: Self-pay | Admitting: Orthopedic Surgery

## 2013-11-09 ENCOUNTER — Ambulatory Visit (INDEPENDENT_AMBULATORY_CARE_PROVIDER_SITE_OTHER): Payer: PRIVATE HEALTH INSURANCE | Admitting: Family Medicine

## 2013-11-09 ENCOUNTER — Encounter: Payer: Self-pay | Admitting: Family Medicine

## 2013-11-09 ENCOUNTER — Encounter (HOSPITAL_COMMUNITY): Payer: Self-pay | Admitting: Pharmacy Technician

## 2013-11-09 VITALS — BP 132/61 | HR 86 | Temp 98.7°F | Ht 69.0 in | Wt 207.0 lb

## 2013-11-09 DIAGNOSIS — F329 Major depressive disorder, single episode, unspecified: Secondary | ICD-10-CM

## 2013-11-09 DIAGNOSIS — G8929 Other chronic pain: Secondary | ICD-10-CM

## 2013-11-09 DIAGNOSIS — F32A Depression, unspecified: Secondary | ICD-10-CM

## 2013-11-09 DIAGNOSIS — F3289 Other specified depressive episodes: Secondary | ICD-10-CM

## 2013-11-09 DIAGNOSIS — R809 Proteinuria, unspecified: Secondary | ICD-10-CM

## 2013-11-09 DIAGNOSIS — E1149 Type 2 diabetes mellitus with other diabetic neurological complication: Secondary | ICD-10-CM

## 2013-11-09 DIAGNOSIS — I10 Essential (primary) hypertension: Secondary | ICD-10-CM

## 2013-11-09 DIAGNOSIS — E876 Hypokalemia: Secondary | ICD-10-CM

## 2013-11-09 LAB — BASIC METABOLIC PANEL
BUN: 14 mg/dL (ref 6–23)
CO2: 33 mEq/L — ABNORMAL HIGH (ref 19–32)
Calcium: 9.3 mg/dL (ref 8.4–10.5)
Chloride: 100 mEq/L (ref 96–112)
Creat: 1.2 mg/dL (ref 0.50–1.35)
Glucose, Bld: 129 mg/dL — ABNORMAL HIGH (ref 70–99)
Potassium: 3.2 mEq/L — ABNORMAL LOW (ref 3.5–5.3)
Sodium: 141 mEq/L (ref 135–145)

## 2013-11-09 LAB — POCT GLYCOSYLATED HEMOGLOBIN (HGB A1C): Hemoglobin A1C: 6

## 2013-11-09 MED ORDER — GLUCOSE BLOOD VI STRP: ORAL_STRIP | Status: DC

## 2013-11-09 MED ORDER — ONETOUCH ULTRA 2 W/DEVICE KIT: PACK | Status: DC

## 2013-11-09 MED ORDER — ONETOUCH ULTRASOFT LANCETS MISC: Status: DC

## 2013-11-09 MED ORDER — FLUOXETINE HCL 20 MG PO CAPS: 20.0000 mg | ORAL_CAPSULE | Freq: Every day | ORAL | Status: DC

## 2013-11-09 MED ORDER — HYDROCODONE-ACETAMINOPHEN 7.5-325 MG PO TABS: 1.0000 | ORAL_TABLET | Freq: Three times a day (TID) | ORAL | Status: DC | PRN

## 2013-11-09 NOTE — Assessment & Plan Note (Signed)
Symptoms seem to be improved with Prozac. Will increase to 20mg  today.

## 2013-11-09 NOTE — Patient Instructions (Signed)
Thank you for coming to see me today. It was a pleasure. Today we talked about:   Diabetes: I am ordering a glucose meter and test strips. Your A1C today is 6, which is great. I will refer you to a Podiatrist  Hypertension: your blood pressure was great today.  Chronic pain: I have refilled your Norco  Depression: we will increase the prozac to 20mg  daily in the morning.  Healthcare maintenance: please schedule an appointment to have a colonoscopy  Please make an appointment to see me in 3 months for follow-up.  If you have any questions or concerns, please do not hesitate to call the office at [phone].  Sincerely,  Cordelia Poche, MD

## 2013-11-09 NOTE — Assessment & Plan Note (Signed)
Refill Norco. UDS today.

## 2013-11-09 NOTE — Assessment & Plan Note (Signed)
Controlled today. Continue current regimen

## 2013-11-09 NOTE — Progress Notes (Signed)
    Subjective    Tyler Lester is a 68 y.o. male that presents for a follow-up visit for chronic issues.   1. Sleep: Sleeping about 5-6 hours per night. Wakes up during the night for about 1 hour before going to sleep. Takes naps during the days because he's at home sitting in his chair. Sleep/Energy has been better since starting Prozac. 2. Chronic pain: Pain in hip, back, knees. Medication helping. Patient still functional 3. Hypertension: Adherent to treatment. No headache, chest pain or shortness of breath. No leg swelling 4. Diabetes: Does not currently have a glucose meter. Does not check blood sugar at home. No nausea/vomiting. No changes to vision. Has an appointment with ophthalmologist in a few weeks. No neuropathy symptoms. No ulcers. Checks feet often  History  Substance Use Topics  . Smoking status: Never Smoker   . Smokeless tobacco: Not on file  . Alcohol Use: No    No Known Allergies  No orders of the defined types were placed in this encounter.    ROS  Per HPI   Objective   BP 132/61  Pulse 86  Temp(Src) 98.7 F (37.1 C) (Oral)  Ht 5\' 9"  (1.753 m)  Wt 207 lb (93.895 kg)  BMI 30.55 kg/m2  General: Well appearing in no acute distress Extremities: No edema Neuro: monofilament performed and documented in chart  Assessment and Plan   Please refer to problem based charting of assessment and plan

## 2013-11-09 NOTE — Assessment & Plan Note (Signed)
Will obtain protein/creatinine ratio today.

## 2013-11-09 NOTE — Assessment & Plan Note (Signed)
Will refer to podiatry. Placed prescription for meter and test strips. Currently controlled but with neuropathy. Current A1C of 6.

## 2013-11-10 LAB — DRUG SCR UR, PAIN MGMT, REFLEX CONF
Amphetamine Screen, Ur: NEGATIVE
Barbiturate Quant, Ur: NEGATIVE
Benzodiazepines.: NEGATIVE
Cocaine Metabolites: NEGATIVE
Creatinine,U: 293.89 mg/dL
Marijuana Metabolite: NEGATIVE
Methadone: NEGATIVE
Phencyclidine (PCP): NEGATIVE
Propoxyphene: NEGATIVE

## 2013-11-10 LAB — PROTEIN / CREATININE RATIO, URINE
Creatinine, Urine: 299.4 mg/dL
Protein Creatinine Ratio: 0.63 — ABNORMAL HIGH (ref <0.15)
Total Protein, Urine: 189 mg/dL — ABNORMAL HIGH (ref 5–25)

## 2013-11-10 NOTE — H&P (Signed)
PREOPERATIVE H&P  Chief Complaint: Progressive balance deterioration  HPI: Tyler Lester is a 68 y.o. male who presents with ongoing and progressive deterioration in balance  MRI reveals severe spinal cord compression C3-C6  Patient has failed multiple forms of conservative care and continues to have pain (see office notes for additional details regarding the patient's full course of treatment)  Past Medical History  Diagnosis Date  . Diabetes mellitus   . Chronic pain   . Hypertension   . Hyperlipidemia   . Sleep apnea    Past Surgical History  Procedure Laterality Date  . Colon surgery    . Cataract extraction w/ intraocular lens implant Left 06/2012    laser for posterior capsule?   History   Social History  . Marital Status: Single    Spouse Name: N/A    Number of Children: N/A  . Years of Education: N/A   Social History Main Topics  . Smoking status: Never Smoker   . Smokeless tobacco: Not on file  . Alcohol Use: No  . Drug Use: No  . Sexual Activity: Not on file   Other Topics Concern  . Not on file   Social History Narrative  . No narrative on file   No family history on file. No Known Allergies Prior to Admission medications   Medication Sig Start Date End Date Taking? Authorizing Provider  atorvastatin (LIPITOR) 20 MG tablet Take 1 tablet (20 mg total) by mouth daily. 04/08/13  Yes Kandis Nab, MD  benzonatate (TESSALON PERLES) 100 MG capsule Take 1 capsule (100 mg total) by mouth 3 (three) times daily as needed for cough. 09/17/13  Yes Timmothy Euler, MD  Cholecalciferol (VITAMIN D3) 400 UNITS tablet Take 2 tablets (800 Units total) by mouth daily. 03/10/12  Yes Vivi Ferns, MD  clonazePAM (KLONOPIN) 0.5 MG tablet Take 0.5 mg by mouth 2 (two) times daily as needed for anxiety.    Yes Historical Provider, MD  cyclobenzaprine (FLEXERIL) 10 MG tablet Take 1 tablet (10 mg total) by mouth 2 (two) times daily as needed for muscle spasms. 07/31/13   Yes Kandis Nab, MD  dextromethorphan-guaiFENesin [facility] DM) 30-600 MG per 12 hr tablet Take 1 tablet by mouth 2 (two) times daily. 09/22/13  Yes Bernadene Bell, MD  FLUoxetine (PROZAC) 20 MG capsule Take 1 capsule (20 mg total) by mouth daily. 11/09/13  Yes Cordelia Poche, MD  gabapentin (NEURONTIN) 100 MG capsule Take 1 capsule (100 mg total) by mouth 3 (three) times daily. 07/13/13  Yes Lupita Dawn, MD  glimepiride (AMARYL) 4 MG tablet Take 1 tablet (4 mg total) by mouth daily. 04/08/13  Yes Kandis Nab, MD  HYDROcodone-acetaminophen (NORCO) 7.5-325 MG per tablet Take 1 tablet by mouth 3 (three) times daily as needed for moderate pain. 11/09/13  Yes Cordelia Poche, MD  ipratropium (ATROVENT) 0.06 % nasal spray Place 2 sprays into both nostrils 4 (four) times daily. 04/30/13  Yes Kandis Nab, MD  ketoconazole (NIZORAL) 2 % cream Apply 1 application topically daily.   Yes Historical Provider, MD  lisinopril (PRINIVIL,ZESTRIL) 10 MG tablet Take 1 tablet (10 mg total) by mouth daily. 08/24/13  Yes Kandis Nab, MD  metFORMIN (GLUCOPHAGE) 1000 MG tablet Take 1 tablet (1,000 mg total) by mouth 2 (two) times daily with a meal. 08/24/13  Yes Kandis Nab, MD  omeprazole (PRILOSEC) 20 MG capsule Take 1 capsule (20 mg total) by mouth daily.  08/24/13  Yes Kandis Nab, MD  pantoprazole (PROTONIX) 40 MG tablet Take 40 mg by mouth daily.   Yes Historical Provider, MD  potassium chloride (KLOR-CON) 20 MEQ packet Take 20 mEq by mouth 3 (three) times daily.   Yes Historical Provider, MD  tamsulosin (FLOMAX) 0.4 MG CAPS capsule Take 2 capsules (0.8 mg total) by mouth daily. 01/30/13  Yes Melony Overly, MD  terbinafine (LAMISIL) 250 MG tablet Take 1 tablet (250 mg total) by mouth daily. 04/09/13  Yes Kandis Nab, MD  Blood Glucose Monitoring Suppl (ONE TOUCH ULTRA 2) W/DEVICE KIT Please dispense one unit. Patient to check blood sugar once every morning before breakfast. 11/09/13   Cordelia Poche,  MD  Elastic Bandages & Supports (LUMBAR BACK BRACE/SUPPORT PAD) MISC 1 Device by Does not apply route daily. 10/28/12   Kandis Nab, MD  glucose blood (ONE TOUCH ULTRA TEST) test strip Check sugar 6 x daily 11/09/13   Cordelia Poche, MD  Lancets [facility] ULTRASOFT) lancets Use as instructed 11/09/13   Cordelia Poche, MD  Misc. Devices (CANE) MISC 1 Device by Does not apply route daily. Please fit one point cane for balance and support 10/28/12   Kandis Nab, MD     All other systems have been reviewed and were otherwise negative with the exception of those mentioned in the HPI and as above.  Physical Exam: There were no vitals filed for this visit.  General: Alert, no acute distress Cardiovascular: No pedal edema Respiratory: No cyanosis, no use of accessory musculature Skin: No lesions in the area of chief complaint Neurologic: Sensation intact distally Psychiatric: Patient is competent for consent with normal mood and affect Lymphatic: No axillary or cervical lymphadenopathy  MUSCULOSKELETAL: Wide-based gait, diffuse weakness throughout upper and lower extremities  Assessment/Plan: Myelopathy Plan for Procedure(s): Posterior cervical decompression fusion, cervical 3-4, cervical 4-5, cervical 5-6, cervical 6-7 with instrumentation and allograft   (LEVEL 4)  Of note, the patient has been non-compliant on several occasions, and has canceled a previous surgery and did not proceed with getting a CT scan of his neck, as was previously recommended. I do have ongoing concerns with postoperative compliance as well, however, given his severe neurologic compression and myelopathy, I feel that any additional delay in surgery brings with it increased risk on ongoing neurologic dysfunction.   Sinclair Ship, MD 11/10/2013 12:34 PM

## 2013-11-11 ENCOUNTER — Encounter (HOSPITAL_COMMUNITY): Payer: Self-pay

## 2013-11-11 ENCOUNTER — Encounter (HOSPITAL_COMMUNITY)
Admission: RE | Admit: 2013-11-11 | Discharge: 2013-11-11 | Disposition: A | Discharge status: Home / Self Care | Payer: PRIVATE HEALTH INSURANCE | Source: Ambulatory Visit | Attending: Orthopedic Surgery | Admitting: Orthopedic Surgery

## 2013-11-11 HISTORY — DX: Anesthesia of skin: R20.0

## 2013-11-11 HISTORY — DX: Shortness of breath: R06.02

## 2013-11-11 HISTORY — DX: Pneumonia, unspecified organism: J18.9

## 2013-11-11 HISTORY — DX: Unilateral inguinal hernia, without obstruction or gangrene, not specified as recurrent: K40.90

## 2013-11-11 LAB — APTT: aPTT: 30 seconds (ref 24–37)

## 2013-11-11 LAB — CBC WITH DIFFERENTIAL/PLATELET
Basophils Absolute: 0 10*3/uL (ref 0.0–0.1)
Basophils Relative: 0 % (ref 0–1)
Eosinophils Absolute: 0.1 10*3/uL (ref 0.0–0.7)
Eosinophils Relative: 2 % (ref 0–5)
HCT: 44.2 % (ref 39.0–52.0)
Hemoglobin: 14.8 g/dL (ref 13.0–17.0)
Lymphocytes Relative: 40 % (ref 12–46)
Lymphs Abs: 2.1 10*3/uL (ref 0.7–4.0)
MCH: 28.6 pg (ref 26.0–34.0)
MCHC: 33.5 g/dL (ref 30.0–36.0)
MCV: 85.5 fL (ref 78.0–100.0)
Monocytes Absolute: 0.5 10*3/uL (ref 0.1–1.0)
Monocytes Relative: 10 % (ref 3–12)
Neutro Abs: 2.5 10*3/uL (ref 1.7–7.7)
Neutrophils Relative %: 48 % (ref 43–77)
Platelets: 189 10*3/uL (ref 150–400)
RBC: 5.17 MIL/uL (ref 4.22–5.81)
RDW: 12.8 % (ref 11.5–15.5)
WBC: 5.2 10*3/uL (ref 4.0–10.5)

## 2013-11-11 LAB — URINE MICROSCOPIC-ADD ON

## 2013-11-11 LAB — URINALYSIS, ROUTINE W REFLEX MICROSCOPIC
Bilirubin Urine: NEGATIVE
Glucose, UA: NEGATIVE mg/dL
Ketones, ur: NEGATIVE mg/dL
Leukocytes, UA: NEGATIVE
Nitrite: NEGATIVE
Protein, ur: >300 mg/dL — AB
Specific Gravity, Urine: 1.018 (ref 1.005–1.030)
Urobilinogen, UA: 1 mg/dL (ref 0.0–1.0)
pH: 6.5 (ref 5.0–8.0)

## 2013-11-11 LAB — HEPATIC FUNCTION PANEL
ALT: 10 U/L (ref 0–53)
AST: 16 U/L (ref 0–37)
Albumin: 3.3 g/dL — ABNORMAL LOW (ref 3.5–5.2)
Alkaline Phosphatase: 52 U/L (ref 39–117)
Bilirubin, Direct: <0.2 mg/dL (ref 0.0–0.3)
Total Bilirubin: 0.5 mg/dL (ref 0.3–1.2)
Total Protein: 7 g/dL (ref 6.0–8.3)

## 2013-11-11 LAB — TYPE AND SCREEN
ABO/RH(D): B POS
Antibody Screen: NEGATIVE

## 2013-11-11 LAB — ABO/RH: ABO/RH(D): B POS

## 2013-11-11 LAB — PROTIME-INR
INR: 1.03 (ref 0.00–1.49)
Prothrombin Time: 13.5 seconds (ref 11.6–15.2)

## 2013-11-11 LAB — SURGICAL PCR SCREEN
MRSA, PCR: NEGATIVE
Staphylococcus aureus: NEGATIVE

## 2013-11-11 MED ORDER — CEFAZOLIN SODIUM-DEXTROSE 2-3 GM-% IV SOLR
2.0000 g | INTRAVENOUS | Status: AC
  Administered 2013-11-12 (×2): 2 g via INTRAVENOUS
  Filled 2013-11-11: qty 50

## 2013-11-11 NOTE — Pre-Procedure Instructions (Signed)
Tyler Lester  11/11/2013   Your procedure is scheduled on:  Thursday, Sept. 16, 2015  Report to [facility] Admitting at 9:00 AM.  Call this number if you have problems the morning of surgery: [phone]   Remember:   Do not eat food or drink liquids after midnight.    Take these medicines the morning of surgery with A SIP OF WATER: FLUoxetine (PROZAC), gabapentin (NEURONTIN), omeprazole (PRILOSEC), pantoprazole (PROTONIX), tamsulosin (FLOMAX) ipratropium (ATROVENT) nasal spray, if needed:clonazePAM (KLONOPIN) for anxiety, HYDROcodone for pain  Stop taking Aspirin,vitamins, and herbal medications. Do not take any NSAIDs ie: Ibuprofen, Advil, Naproxen or any medication containing Aspirin.   Do not wear jewelry, make-up or nail polish.  Do not wear lotions, powders, or perfumes. You may not wear deodorant.  Do not shave 48 hours prior to surgery. Men may shave face and neck.  Do not bring valuables to the hospital.  [facility] is not responsible for any belongings or valuables.               Contacts, dentures or bridgework may not be worn into surgery.  Leave suitcase in the car. After surgery it may be brought to your room.  For patients admitted to the hospital, discharge time is determined by your treatment team.               Patients discharged the day of surgery will not be allowed to drive home.  Name and phone number of your driver:   Special Instructions:  Special Instructions:Special Instructions: [facility] - Preparing for Surgery  Before surgery, you can play an important role.  Because skin is not sterile, your skin needs to be as free of germs as possible.  You can reduce the number of germs on you skin by washing with CHG (chlorahexidine gluconate) soap before surgery.  CHG is an antiseptic cleaner which kills germs and bonds with the skin to continue killing germs even after washing.  Please DO NOT use if you have an allergy to CHG or antibacterial  soaps.  If your skin becomes reddened/irritated stop using the CHG and inform your nurse when you arrive at Short Stay.  Do not shave (including legs and underarms) for at least 48 hours prior to the first CHG shower.  You may shave your face.  Please follow these instructions carefully:   1.  Shower with CHG Soap the night before surgery and the morning of Surgery.  2.  If you choose to wash your hair, wash your hair first as usual with your normal shampoo.  3.  After you shampoo, rinse your hair and body thoroughly to remove the Shampoo.  4.  Use CHG as you would any other liquid soap.  You can apply chg directly  to the skin and wash gently with scrungie or a clean washcloth.  5.  Apply the CHG Soap to your body ONLY FROM THE NECK DOWN.  Do not use on open wounds or open sores.  Avoid contact with your eyes, ears, mouth and genitals (private parts).  Wash genitals (private parts) with your normal soap.  6.  Wash thoroughly, paying special attention to the area where your surgery will be performed.  7.  Thoroughly rinse your body with warm water from the neck down.  8.  DO NOT shower/wash with your normal soap after using and rinsing off the CHG Soap.  9.  Pat yourself dry with a clean towel.  10.  Wear clean pajamas.            11.  Place clean sheets on your bed the night of your first shower and do not sleep with pets.  Day of Surgery  Do not apply any lotions/deodorants the morning of surgery.  Please wear clean clothes to the hospital/surgery center.   Please read over the following fact sheets that you were given: Pain Booklet, Coughing and Deep Breathing, Blood Transfusion Information, MRSA Information and Surgical Site Infection Prevention

## 2013-11-11 NOTE — Progress Notes (Signed)
Pt denies SOB, chest pain, and being under the care of a cardiologist. Pt denies having a chest x ray and EKG within the last year. Pt denies having a stress test, echo and cardiac cath. 

## 2013-11-12 ENCOUNTER — Inpatient Hospital Stay (HOSPITAL_COMMUNITY): Payer: PRIVATE HEALTH INSURANCE

## 2013-11-12 ENCOUNTER — Encounter (HOSPITAL_COMMUNITY)
Admission: RE | Disposition: A | Discharge status: Home / Self Care | Payer: PRIVATE HEALTH INSURANCE | Source: Ambulatory Visit | Attending: Orthopedic Surgery

## 2013-11-12 ENCOUNTER — Inpatient Hospital Stay (HOSPITAL_COMMUNITY): Payer: PRIVATE HEALTH INSURANCE | Admitting: Anesthesiology

## 2013-11-12 ENCOUNTER — Encounter (HOSPITAL_COMMUNITY): Payer: PRIVATE HEALTH INSURANCE | Admitting: Anesthesiology

## 2013-11-12 ENCOUNTER — Inpatient Hospital Stay (HOSPITAL_COMMUNITY)
Admission: RE | Admit: 2013-11-12 | Discharge: 2013-11-13 | DRG: 473 | Disposition: A | Discharge status: Home / Self Care | Payer: PRIVATE HEALTH INSURANCE | Source: Ambulatory Visit | Attending: Orthopedic Surgery | Admitting: Orthopedic Surgery

## 2013-11-12 ENCOUNTER — Encounter (HOSPITAL_COMMUNITY): Payer: Self-pay | Admitting: *Deleted

## 2013-11-12 DIAGNOSIS — Z79899 Other long term (current) drug therapy: Secondary | ICD-10-CM

## 2013-11-12 DIAGNOSIS — G8929 Other chronic pain: Secondary | ICD-10-CM | POA: Diagnosis present

## 2013-11-12 DIAGNOSIS — E119 Type 2 diabetes mellitus without complications: Secondary | ICD-10-CM | POA: Diagnosis not present

## 2013-11-12 DIAGNOSIS — Z91199 Patient's noncompliance with other medical treatment and regimen due to unspecified reason: Secondary | ICD-10-CM

## 2013-11-12 DIAGNOSIS — Z9119 Patient's noncompliance with other medical treatment and regimen: Secondary | ICD-10-CM | POA: Diagnosis not present

## 2013-11-12 DIAGNOSIS — G959 Disease of spinal cord, unspecified: Secondary | ICD-10-CM | POA: Diagnosis present

## 2013-11-12 DIAGNOSIS — M5030 Other cervical disc degeneration, unspecified cervical region: Secondary | ICD-10-CM | POA: Diagnosis present

## 2013-11-12 DIAGNOSIS — M4712 Other spondylosis with myelopathy, cervical region: Principal | ICD-10-CM | POA: Diagnosis present

## 2013-11-12 DIAGNOSIS — E785 Hyperlipidemia, unspecified: Secondary | ICD-10-CM | POA: Diagnosis not present

## 2013-11-12 DIAGNOSIS — G4730 Sleep apnea, unspecified: Secondary | ICD-10-CM | POA: Diagnosis present

## 2013-11-12 HISTORY — PX: POSTERIOR CERVICAL FUSION/FORAMINOTOMY: SHX5038

## 2013-11-12 LAB — OPIATES/OPIOIDS (LC/MS-MS)
Codeine Urine: 537 ng/mL — ABNORMAL HIGH (ref <50)
Hydrocodone: NEGATIVE ng/mL (ref <50)
Hydromorphone: NEGATIVE ng/mL (ref <50)
Morphine Urine: 109 ng/mL — ABNORMAL HIGH (ref <50)
Norhydrocodone, Ur: NEGATIVE ng/mL (ref <50)
Noroxycodone, Ur: NEGATIVE ng/mL (ref <50)
Oxycodone, ur: NEGATIVE ng/mL (ref <50)
Oxymorphone: NEGATIVE ng/mL (ref <50)

## 2013-11-12 LAB — GLUCOSE, CAPILLARY
Glucose-Capillary: 96 mg/dL (ref 70–99)
Glucose-Capillary: 98 mg/dL (ref 70–99)
Glucose-Capillary: 100 mg/dL — ABNORMAL HIGH (ref 70–99)
Glucose-Capillary: 99 mg/dL (ref 70–99)
Glucose-Capillary: 111 mg/dL — ABNORMAL HIGH (ref 70–99)
Glucose-Capillary: 136 mg/dL — ABNORMAL HIGH (ref 70–99)

## 2013-11-12 SURGERY — POSTERIOR CERVICAL FUSION/FORAMINOTOMY LEVEL 4
Anesthesia: General

## 2013-11-12 MED ORDER — DM-GUAIFENESIN ER 30-600 MG PO TB12
1.0000 | ORAL_TABLET | Freq: Two times a day (BID) | ORAL | Status: DC
  Administered 2013-11-13: 1 via ORAL
  Filled 2013-11-12 (×3): qty 1

## 2013-11-12 MED ORDER — LIDOCAINE HCL (CARDIAC) 20 MG/ML IV SOLN
INTRAVENOUS | Status: DC | PRN
  Administered 2013-11-12: 100 mg via INTRAVENOUS

## 2013-11-12 MED ORDER — INSULIN ASPART 100 UNIT/ML ~~LOC~~ SOLN: 0.0000 [IU] | Freq: Every day | SUBCUTANEOUS | Status: DC

## 2013-11-12 MED ORDER — MIDAZOLAM HCL 5 MG/5ML IJ SOLN
INTRAMUSCULAR | Status: DC | PRN
  Administered 2013-11-12: 2 mg via INTRAVENOUS

## 2013-11-12 MED ORDER — ZOLPIDEM TARTRATE 5 MG PO TABS: 5.0000 mg | ORAL_TABLET | Freq: Every evening | ORAL | Status: DC | PRN

## 2013-11-12 MED ORDER — SODIUM CHLORIDE 0.9 % IV SOLN
10.0000 mg | INTRAVENOUS | Status: DC | PRN
Start: 2013-11-12 — End: 2013-11-12
  Administered 2013-11-12 (×2): 25 ug/min via INTRAVENOUS

## 2013-11-12 MED ORDER — PROPOFOL 10 MG/ML IV BOLUS
INTRAVENOUS | Status: AC
  Filled 2013-11-12: qty 20

## 2013-11-12 MED ORDER — GABAPENTIN 100 MG PO CAPS
100.0000 mg | ORAL_CAPSULE | Freq: Three times a day (TID) | ORAL | Status: DC
  Administered 2013-11-13: 100 mg via ORAL
  Filled 2013-11-12 (×4): qty 1

## 2013-11-12 MED ORDER — SENNOSIDES-DOCUSATE SODIUM 8.6-50 MG PO TABS
1.0000 | ORAL_TABLET | Freq: Every evening | ORAL | Status: DC | PRN
Start: 2013-11-12 — End: 2013-11-13

## 2013-11-12 MED ORDER — EPHEDRINE SULFATE 50 MG/ML IJ SOLN
INTRAMUSCULAR | Status: DC | PRN
  Administered 2013-11-12 (×2): 10 mg via INTRAVENOUS
  Administered 2013-11-12 (×2): 5 mg via INTRAVENOUS

## 2013-11-12 MED ORDER — LISINOPRIL 10 MG PO TABS
10.0000 mg | ORAL_TABLET | Freq: Every day | ORAL | Status: DC
  Administered 2013-11-13: 10 mg via ORAL
  Filled 2013-11-12 (×2): qty 1

## 2013-11-12 MED ORDER — PROPOFOL INFUSION 10 MG/ML OPTIME
INTRAVENOUS | Status: DC | PRN
  Administered 2013-11-12: 14:00:00 via INTRAVENOUS
  Administered 2013-11-12 (×2): 50 ug/kg/min via INTRAVENOUS

## 2013-11-12 MED ORDER — SODIUM CHLORIDE 0.9 % IJ SOLN
INTRAMUSCULAR | Status: AC
  Filled 2013-11-12: qty 10

## 2013-11-12 MED ORDER — FLEET ENEMA 7-19 GM/118ML RE ENEM
1.0000 | ENEMA | Freq: Once | RECTAL | Status: AC | PRN
Start: 2013-11-12 — End: 2013-11-12

## 2013-11-12 MED ORDER — ALBUMIN HUMAN 5 % IV SOLN
INTRAVENOUS | Status: DC | PRN
  Administered 2013-11-12 (×2): via INTRAVENOUS

## 2013-11-12 MED ORDER — CLONAZEPAM 0.5 MG PO TABS: 0.5000 mg | ORAL_TABLET | Freq: Two times a day (BID) | ORAL | Status: DC | PRN

## 2013-11-12 MED ORDER — THROMBIN 20000 UNITS EX KIT
PACK | CUTANEOUS | Status: DC | PRN
  Administered 2013-11-12: 20000 [IU] via TOPICAL

## 2013-11-12 MED ORDER — SODIUM CHLORIDE 0.9 % IJ SOLN: 3.0000 mL | INTRAMUSCULAR | Status: DC | PRN

## 2013-11-12 MED ORDER — LACTATED RINGERS IV SOLN
INTRAVENOUS | Status: DC
  Administered 2013-11-12: 10:00:00 via INTRAVENOUS

## 2013-11-12 MED ORDER — OXYCODONE HCL 5 MG PO TABS: 5.0000 mg | ORAL_TABLET | Freq: Once | ORAL | Status: DC | PRN

## 2013-11-12 MED ORDER — DEXAMETHASONE SODIUM PHOSPHATE 4 MG/ML IJ SOLN
INTRAMUSCULAR | Status: DC | PRN
  Administered 2013-11-12: 4 mg via INTRAVENOUS

## 2013-11-12 MED ORDER — POVIDONE-IODINE 7.5 % EX SOLN: Freq: Once | CUTANEOUS | Status: DC

## 2013-11-12 MED ORDER — ACETAMINOPHEN 650 MG RE SUPP: 650.0000 mg | RECTAL | Status: DC | PRN

## 2013-11-12 MED ORDER — CEFAZOLIN SODIUM 1-5 GM-% IV SOLN
1.0000 g | Freq: Three times a day (TID) | INTRAVENOUS | Status: AC
  Administered 2013-11-12 – 2013-11-13 (×2): 1 g via INTRAVENOUS
  Filled 2013-11-12 (×2): qty 50

## 2013-11-12 MED ORDER — ALUM & MAG HYDROXIDE-SIMETH 200-200-20 MG/5ML PO SUSP
30.0000 mL | Freq: Four times a day (QID) | ORAL | Status: DC | PRN
Start: 2013-11-12 — End: 2013-11-13

## 2013-11-12 MED ORDER — PHENYLEPHRINE HCL 10 MG/ML IJ SOLN
INTRAMUSCULAR | Status: DC | PRN
  Administered 2013-11-12: 80 ug via INTRAVENOUS
  Administered 2013-11-12: 40 ug via INTRAVENOUS
  Administered 2013-11-12: 120 ug via INTRAVENOUS
  Administered 2013-11-12 (×2): 80 ug via INTRAVENOUS

## 2013-11-12 MED ORDER — FLUOXETINE HCL 20 MG PO CAPS
20.0000 mg | ORAL_CAPSULE | Freq: Every day | ORAL | Status: DC
  Filled 2013-11-12: qty 1

## 2013-11-12 MED ORDER — PROMETHAZINE HCL 25 MG/ML IJ SOLN: 6.2500 mg | INTRAMUSCULAR | Status: DC | PRN

## 2013-11-12 MED ORDER — LACTATED RINGERS IV SOLN
INTRAVENOUS | Status: DC | PRN
  Administered 2013-11-12: 10:00:00 via INTRAVENOUS

## 2013-11-12 MED ORDER — MORPHINE SULFATE 2 MG/ML IJ SOLN
1.0000 mg | INTRAMUSCULAR | Status: DC | PRN
  Administered 2013-11-12: 2 mg via INTRAVENOUS
  Filled 2013-11-12: qty 1

## 2013-11-12 MED ORDER — ONDANSETRON HCL 4 MG/2ML IJ SOLN: 4.0000 mg | INTRAMUSCULAR | Status: DC | PRN

## 2013-11-12 MED ORDER — ATORVASTATIN CALCIUM 20 MG PO TABS
20.0000 mg | ORAL_TABLET | Freq: Every day | ORAL | Status: DC
  Administered 2013-11-13: 20 mg via ORAL
  Filled 2013-11-12 (×2): qty 1

## 2013-11-12 MED ORDER — SUFENTANIL CITRATE 50 MCG/ML IV SOLN
INTRAVENOUS | Status: AC
  Filled 2013-11-12: qty 1

## 2013-11-12 MED ORDER — OXYCODONE-ACETAMINOPHEN 5-325 MG PO TABS
1.0000 | ORAL_TABLET | ORAL | Status: DC | PRN
  Administered 2013-11-13 (×2): 2 via ORAL
  Filled 2013-11-12 (×2): qty 2

## 2013-11-12 MED ORDER — SUCCINYLCHOLINE CHLORIDE 20 MG/ML IJ SOLN
INTRAMUSCULAR | Status: DC | PRN
  Administered 2013-11-12: 120 mg via INTRAVENOUS

## 2013-11-12 MED ORDER — IPRATROPIUM BROMIDE 0.06 % NA SOLN
2.0000 | Freq: Four times a day (QID) | NASAL | Status: DC
  Administered 2013-11-13 (×3): 2 via NASAL
  Filled 2013-11-12: qty 15

## 2013-11-12 MED ORDER — LACTATED RINGERS IV SOLN
INTRAVENOUS | Status: DC
  Administered 2013-11-12 (×2): via INTRAVENOUS

## 2013-11-12 MED ORDER — ONDANSETRON HCL 4 MG/2ML IJ SOLN
INTRAMUSCULAR | Status: AC
  Filled 2013-11-12: qty 2

## 2013-11-12 MED ORDER — MENTHOL 3 MG MT LOZG: 1.0000 | LOZENGE | OROMUCOSAL | Status: DC | PRN

## 2013-11-12 MED ORDER — PANTOPRAZOLE SODIUM 40 MG PO TBEC
40.0000 mg | DELAYED_RELEASE_TABLET | Freq: Every day | ORAL | Status: DC
  Administered 2013-11-13: 40 mg via ORAL
  Filled 2013-11-12 (×2): qty 1

## 2013-11-12 MED ORDER — SCOPOLAMINE 1 MG/3DAYS TD PT72
MEDICATED_PATCH | TRANSDERMAL | Status: DC | PRN
  Administered 2013-11-12: 1 via TRANSDERMAL

## 2013-11-12 MED ORDER — POTASSIUM CHLORIDE 20 MEQ PO PACK
20.0000 meq | PACK | Freq: Three times a day (TID) | ORAL | Status: DC
  Filled 2013-11-12: qty 1

## 2013-11-12 MED ORDER — BUPIVACAINE-EPINEPHRINE 0.25% -1:200000 IJ SOLN
INTRAMUSCULAR | Status: DC | PRN
  Administered 2013-11-12: 5 mL

## 2013-11-12 MED ORDER — HYDROMORPHONE HCL 1 MG/ML IJ SOLN
0.2500 mg | INTRAMUSCULAR | Status: DC | PRN
  Administered 2013-11-12: 0.5 mg via INTRAVENOUS

## 2013-11-12 MED ORDER — CEFAZOLIN SODIUM-DEXTROSE 2-3 GM-% IV SOLR
INTRAVENOUS | Status: AC
  Filled 2013-11-12: qty 50

## 2013-11-12 MED ORDER — INSULIN ASPART 100 UNIT/ML ~~LOC~~ SOLN
0.0000 [IU] | Freq: Three times a day (TID) | SUBCUTANEOUS | Status: DC
  Administered 2013-11-13: 3 [IU] via SUBCUTANEOUS
  Administered 2013-11-13: 5 [IU] via SUBCUTANEOUS

## 2013-11-12 MED ORDER — THROMBIN 20000 UNITS EX SOLR
CUTANEOUS | Status: AC
  Filled 2013-11-12: qty 20000

## 2013-11-12 MED ORDER — VITAMIN D3 10 MCG (400 UNIT) PO TABS: 800.0000 [IU] | ORAL_TABLET | Freq: Every day | ORAL | Status: DC

## 2013-11-12 MED ORDER — GLIMEPIRIDE 4 MG PO TABS
4.0000 mg | ORAL_TABLET | Freq: Every day | ORAL | Status: DC
Start: 2013-11-13 — End: 2013-11-13
  Filled 2013-11-12 (×2): qty 1

## 2013-11-12 MED ORDER — HYDROMORPHONE HCL 1 MG/ML IJ SOLN
INTRAMUSCULAR | Status: AC
  Administered 2013-11-12: 0.5 mg via INTRAVENOUS
  Filled 2013-11-12: qty 2

## 2013-11-12 MED ORDER — ROCURONIUM BROMIDE 50 MG/5ML IV SOLN
INTRAVENOUS | Status: AC
  Filled 2013-11-12: qty 1

## 2013-11-12 MED ORDER — PANTOPRAZOLE SODIUM 40 MG PO TBEC: 40.0000 mg | DELAYED_RELEASE_TABLET | Freq: Every day | ORAL | Status: DC

## 2013-11-12 MED ORDER — PROPOFOL 10 MG/ML IV BOLUS
INTRAVENOUS | Status: DC | PRN
  Administered 2013-11-12 (×2): 50 mg via INTRAVENOUS
  Administered 2013-11-12: 150 mg via INTRAVENOUS

## 2013-11-12 MED ORDER — METFORMIN HCL 500 MG PO TABS
1000.0000 mg | ORAL_TABLET | Freq: Two times a day (BID) | ORAL | Status: DC
  Filled 2013-11-12 (×3): qty 2

## 2013-11-12 MED ORDER — SODIUM CHLORIDE 0.9 % IJ SOLN: 3.0000 mL | Freq: Two times a day (BID) | INTRAMUSCULAR | Status: DC

## 2013-11-12 MED ORDER — POTASSIUM CHLORIDE CRYS ER 20 MEQ PO TBCR
20.0000 meq | EXTENDED_RELEASE_TABLET | Freq: Three times a day (TID) | ORAL | Status: DC
  Administered 2013-11-13: 20 meq via ORAL
  Filled 2013-11-12 (×4): qty 1

## 2013-11-12 MED ORDER — ARTIFICIAL TEARS OP OINT
TOPICAL_OINTMENT | OPHTHALMIC | Status: DC | PRN
  Administered 2013-11-12: 1 via OPHTHALMIC

## 2013-11-12 MED ORDER — BISACODYL 5 MG PO TBEC: 5.0000 mg | DELAYED_RELEASE_TABLET | Freq: Every day | ORAL | Status: DC | PRN

## 2013-11-12 MED ORDER — TERBINAFINE HCL 250 MG PO TABS
250.0000 mg | ORAL_TABLET | Freq: Every day | ORAL | Status: DC
  Filled 2013-11-12: qty 1

## 2013-11-12 MED ORDER — OXYCODONE HCL 5 MG/5ML PO SOLN: 5.0000 mg | Freq: Once | ORAL | Status: DC | PRN

## 2013-11-12 MED ORDER — SODIUM CHLORIDE 0.9 % IV SOLN: 250.0000 mL | INTRAVENOUS | Status: DC

## 2013-11-12 MED ORDER — DIAZEPAM 5 MG PO TABS: 5.0000 mg | ORAL_TABLET | Freq: Four times a day (QID) | ORAL | Status: DC | PRN

## 2013-11-12 MED ORDER — SUFENTANIL CITRATE 50 MCG/ML IV SOLN
INTRAVENOUS | Status: DC | PRN
  Administered 2013-11-12 (×2): 10 ug via INTRAVENOUS
  Administered 2013-11-12: 5 ug via INTRAVENOUS
  Administered 2013-11-12: 20 ug via INTRAVENOUS
  Administered 2013-11-12: 10 ug via INTRAVENOUS
  Administered 2013-11-12: 5 ug via INTRAVENOUS

## 2013-11-12 MED ORDER — BENZONATATE 100 MG PO CAPS: 100.0000 mg | ORAL_CAPSULE | Freq: Three times a day (TID) | ORAL | Status: DC | PRN

## 2013-11-12 MED ORDER — LIDOCAINE HCL (CARDIAC) 20 MG/ML IV SOLN
INTRAVENOUS | Status: AC
  Filled 2013-11-12: qty 5

## 2013-11-12 MED ORDER — CHOLECALCIFEROL 10 MCG (400 UNIT) PO TABS
800.0000 [IU] | ORAL_TABLET | Freq: Every day | ORAL | Status: DC
  Filled 2013-11-12: qty 2

## 2013-11-12 MED ORDER — BACITRACIN ZINC 500 UNIT/GM EX OINT
TOPICAL_OINTMENT | CUTANEOUS | Status: AC
  Filled 2013-11-12: qty 15

## 2013-11-12 MED ORDER — ONDANSETRON HCL 4 MG/2ML IJ SOLN
INTRAMUSCULAR | Status: DC | PRN
  Administered 2013-11-12: 4 mg via INTRAVENOUS

## 2013-11-12 MED ORDER — SODIUM CHLORIDE 0.9 % IV SOLN: INTRAVENOUS | Status: DC

## 2013-11-12 MED ORDER — DOCUSATE SODIUM 100 MG PO CAPS
100.0000 mg | ORAL_CAPSULE | Freq: Two times a day (BID) | ORAL | Status: DC
  Administered 2013-11-13: 100 mg via ORAL
  Filled 2013-11-12 (×2): qty 1

## 2013-11-12 MED ORDER — MIDAZOLAM HCL 2 MG/2ML IJ SOLN
INTRAMUSCULAR | Status: AC
  Filled 2013-11-12: qty 2

## 2013-11-12 MED ORDER — ACETAMINOPHEN 325 MG PO TABS: 650.0000 mg | ORAL_TABLET | ORAL | Status: DC | PRN

## 2013-11-12 MED ORDER — BUPIVACAINE-EPINEPHRINE (PF) 0.25% -1:200000 IJ SOLN
INTRAMUSCULAR | Status: AC
  Filled 2013-11-12: qty 30

## 2013-11-12 MED ORDER — PHENOL 1.4 % MT LIQD: 1.0000 | OROMUCOSAL | Status: DC | PRN

## 2013-11-12 MED ORDER — TAMSULOSIN HCL 0.4 MG PO CAPS
0.8000 mg | ORAL_CAPSULE | Freq: Every day | ORAL | Status: DC
  Administered 2013-11-13: 0.8 mg via ORAL
  Filled 2013-11-12 (×2): qty 2

## 2013-11-12 MED ORDER — SODIUM CHLORIDE 0.9 % IV SOLN
INTRAVENOUS | Status: DC | PRN
  Administered 2013-11-12: 16:00:00 via INTRAVENOUS

## 2013-11-12 SURGICAL SUPPLY — 93 items
APL SKNCLS STERI-STRIP NONHPOA (GAUZE/BANDAGES/DRESSINGS) ×1
BENZOIN TINCTURE PRP APPL 2/3 (GAUZE/BANDAGES/DRESSINGS) ×3 IMPLANT
BIT DRILL MOUNTAINEER FIX 14 (BIT) ×1
BIT DRILL MOUNTAINEER FIX 14MM (BIT) IMPLANT
BIT DRILL MOUNTAINER FXED 12MM (DRILL) IMPLANT
BLADE CLIPPER SURG NEURO (BLADE) IMPLANT
BUR NEURO DRILL SOFT 3.0X3.8M (BURR) ×2 IMPLANT
BUR PRESCISION 1.7 ELITE (BURR) ×3 IMPLANT
CONNECTOR HEAD-HEAD 21MM (Connector) ×1 IMPLANT
CONT SPEC STER OR (MISCELLANEOUS) ×2 IMPLANT
CORDS BIPOLAR (ELECTRODE) ×2 IMPLANT
COVER SURGICAL LIGHT HANDLE (MISCELLANEOUS) ×2 IMPLANT
DRAIN CHANNEL 10F 3/8 F FF (DRAIN) IMPLANT
DRAIN CHANNEL 15F RND FF W/TCR (WOUND CARE) IMPLANT
DRAIN HEMOVAC 1/8 X 5 (WOUND CARE) IMPLANT
DRAPE C-ARM 42X72 X-RAY (DRAPES) IMPLANT
DRAPE INCISE IOBAN 66X45 STRL (DRAPES) ×2 IMPLANT
DRAPE PED LAPAROTOMY (DRAPES) ×2 IMPLANT
DRAPE POUCH INSTRU U-SHP 10X18 (DRAPES) ×2 IMPLANT
DRAPE PROXIMA HALF (DRAPES) ×11 IMPLANT
DRAPE SURG 17X23 STRL (DRAPES) ×16 IMPLANT
DRAPE TABLE COVER HEAVY DUTY (DRAPES) ×2 IMPLANT
DRILL BIT MOUNTAINEER FIX 14MM (BIT) ×2
DRILL MOUNTAINEER FIXED 12MM (DRILL) ×2
DRSG MEPILEX BORDER 4X8 (GAUZE/BANDAGES/DRESSINGS) ×1 IMPLANT
DURAPREP 26ML APPLICATOR (WOUND CARE) ×2 IMPLANT
ELECT BLADE 4.0 EZ CLEAN MEGAD (MISCELLANEOUS) ×2
ELECT CAUTERY BLADE 6.4 (BLADE) ×2 IMPLANT
ELECT REM PT RETURN 9FT ADLT (ELECTROSURGICAL) ×2
ELECTRODE BLDE 4.0 EZ CLN MEGD (MISCELLANEOUS) ×1 IMPLANT
ELECTRODE REM PT RTRN 9FT ADLT (ELECTROSURGICAL) ×1 IMPLANT
EVACUATOR SILICONE 100CC (DRAIN) IMPLANT
GAUZE SPONGE 4X4 12PLY STRL (GAUZE/BANDAGES/DRESSINGS) ×2 IMPLANT
GAUZE SPONGE 4X4 16PLY XRAY LF (GAUZE/BANDAGES/DRESSINGS) ×8 IMPLANT
GLOVE BIO SURGEON STRL SZ7 (GLOVE) ×2 IMPLANT
GLOVE BIO SURGEON STRL SZ8 (GLOVE) ×2 IMPLANT
GLOVE BIOGEL PI IND STRL 7.5 (GLOVE) ×1 IMPLANT
GLOVE BIOGEL PI IND STRL 8 (GLOVE) ×1 IMPLANT
GLOVE BIOGEL PI INDICATOR 7.5 (GLOVE) ×3
GLOVE BIOGEL PI INDICATOR 8 (GLOVE) ×1
GOWN STRL REUS W/ TWL LRG LVL3 (GOWN DISPOSABLE) ×4 IMPLANT
GOWN STRL REUS W/ TWL XL LVL3 (GOWN DISPOSABLE) ×1 IMPLANT
GOWN STRL REUS W/TWL LRG LVL3 (GOWN DISPOSABLE) ×16
GOWN STRL REUS W/TWL XL LVL3 (GOWN DISPOSABLE) ×2
IV CATH 14GX2 1/4 (CATHETERS) ×2 IMPLANT
KIT BASIN OR (CUSTOM PROCEDURE TRAY) ×2 IMPLANT
KIT ROOM TURNOVER OR (KITS) ×2 IMPLANT
MIX DBX 10CC 35% BONE (Bone Implant) ×1 IMPLANT
MOUNTAINEER 14MM DRILL ×1 IMPLANT
NDL HYPO 25GX1X1/2 BEV (NEEDLE) ×1 IMPLANT
NEEDLE 27GAX1X1/2 (NEEDLE) ×2 IMPLANT
NEEDLE HYPO 25GX1X1/2 BEV (NEEDLE) ×2 IMPLANT
NEURO MONITORING STIM (LABOR (TRAVEL & OVERTIME)) ×1 IMPLANT
NS IRRIG 1000ML POUR BTL (IV SOLUTION) ×3 IMPLANT
NUT HH X CONN OUTER (Orthopedic Implant) ×2 IMPLANT
PACK LAMINECTOMY ORTHO (CUSTOM PROCEDURE TRAY) ×2 IMPLANT
PAD ARMBOARD 7.5X6 YLW CONV (MISCELLANEOUS) ×4 IMPLANT
PATTIES SURGICAL .5 X.5 (GAUZE/BANDAGES/DRESSINGS) ×2 IMPLANT
PATTIES SURGICAL .5 X3 (DISPOSABLE) IMPLANT
PATTIES SURGICAL .5X1.5 (GAUZE/BANDAGES/DRESSINGS) ×2 IMPLANT
PIN MAYFIELD SKULL DISP (PIN) ×2 IMPLANT
PUTTY DBX 1CC (Putty) ×2 IMPLANT
PUTTY DBX 1CC DEPUY (Putty) IMPLANT
ROD MOUNTAINEER 120MM (Rod) ×2 IMPLANT
SCREW F A 3.5X14 (Screw) ×6 IMPLANT
SCREW HH X CONN INNER (Screw) ×2 IMPLANT
SCREW INNER (Screw) ×6 IMPLANT
SCREW MOUNTAINEER FA 18MM (Screw) ×2 IMPLANT
SLEEVE SURGEON STRL (DRAPES) ×1 IMPLANT
SPONGE GAUZE 4X4 12PLY STER LF (GAUZE/BANDAGES/DRESSINGS) ×1 IMPLANT
SPONGE INTESTINAL PEANUT (DISPOSABLE) IMPLANT
SPONGE SURGIFOAM ABS GEL 100 (HEMOSTASIS) ×2 IMPLANT
STRIP CLOSURE SKIN 1/2X4 (GAUZE/BANDAGES/DRESSINGS) ×1 IMPLANT
SURGIFLO TRUKIT (HEMOSTASIS) IMPLANT
SUT MNCRL AB 4-0 PS2 18 (SUTURE) ×2 IMPLANT
SUT VIC AB 0 CT1 18XCR BRD 8 (SUTURE) ×1 IMPLANT
SUT VIC AB 0 CT1 8-18 (SUTURE) ×2
SUT VIC AB 1 CT1 18XCR BRD 8 (SUTURE) ×2 IMPLANT
SUT VIC AB 1 CT1 27 (SUTURE) ×2
SUT VIC AB 1 CT1 27XBRD ANBCTR (SUTURE) IMPLANT
SUT VIC AB 1 CT1 8-18 (SUTURE) ×6
SUT VIC AB 2-0 CT1 18 (SUTURE) IMPLANT
SUT VIC AB 2-0 CT2 18 VCP726D (SUTURE) ×3 IMPLANT
SYR BULB IRRIGATION 50ML (SYRINGE) ×2 IMPLANT
SYR CONTROL 10ML LL (SYRINGE) ×2 IMPLANT
TAPE CLOTH 4X10 WHT NS (GAUZE/BANDAGES/DRESSINGS) ×2 IMPLANT
TAPE CLOTH SURG 4X10 WHT LF (GAUZE/BANDAGES/DRESSINGS) ×1 IMPLANT
TAPE CLOTH SURG 6X10 WHT LF (GAUZE/BANDAGES/DRESSINGS) ×1 IMPLANT
TOWEL OR 17X24 6PK STRL BLUE (TOWEL DISPOSABLE) ×2 IMPLANT
TOWEL OR 17X26 10 PK STRL BLUE (TOWEL DISPOSABLE) ×2 IMPLANT
TRAY FOLEY CATH 16FRSI W/METER (SET/KITS/TRAYS/PACK) ×2 IMPLANT
WATER STERILE IRR 1000ML POUR (IV SOLUTION) ×1 IMPLANT
YANKAUER SUCT BULB TIP NO VENT (SUCTIONS) ×2 IMPLANT

## 2013-11-12 NOTE — Transfer of Care (Signed)
Immediate Anesthesia Transfer of Care Note  Patient: Tyler Lester  Procedure(s) Performed: Procedure(s) with comments: Posterior cervical decompression fusion, cervical 3-4, cervical 4-5, cervical 5-6, cervical 6-7 with instrumentation and allograft   (LEVEL 4) (N/A) - Posterior cervical decompression fusion, cervical 3-4, cervical 4-5, cervical 5-6, cervical 6-7 with instrumentation and allograft  Patient Location: PACU  Anesthesia Type:General  Level of Consciousness: sedated  Airway & Oxygen Therapy: Patient Spontanous Breathing and Patient connected to face mask oxygen  Post-op Assessment: Report given to PACU RN and Post -op Vital signs reviewed and stable  Post vital signs: Reviewed and stable  Complications: No apparent anesthesia complications

## 2013-11-12 NOTE — Progress Notes (Signed)
Orthopedic Tech Progress Note Patient Details:  Tyler Lester 03/26/1945 [id number]  Patient ID: [name], male   DOB: 10-Apr-1945, 68 y.o.   MRN: [id number] Brace order completed by Warnell Forester, Wynne Jury 11/12/2013, 8:38 PM

## 2013-11-12 NOTE — Anesthesia Procedure Notes (Signed)
Procedure Name: Intubation Date/Time: 11/12/2013 11:36 AM Performed by: Jenne Campus Pre-anesthesia Checklist: Patient identified, Emergency Drugs available, Suction available, Patient being monitored and Timeout performed Patient Re-evaluated:Patient Re-evaluated prior to inductionOxygen Delivery Method: Circle system utilized Preoxygenation: Pre-oxygenation with 100% oxygen Intubation Type: IV induction Ventilation: Mask ventilation without difficulty Grade View: Grade I Tube type: Oral Tube size: 7.5 mm Number of attempts: 1 Airway Equipment and Method: Stylet and Video-laryngoscopy Placement Confirmation: positive ETCO2,  CO2 detector and breath sounds checked- equal and bilateral Secured at: 22 cm Tube secured with: Tape Dental Injury: Teeth and Oropharynx as per pre-operative assessment  Difficulty Due To: Difficult Airway-  due to neck instability and Difficulty was anticipated Comments: Smooth IV induction. EZ mask. DL x 1 elective video-glide. 7.5 ETT passed easily. AIO. +ETCO2 bbse. Cervical spine and head maintained in newtral position throughout induction and intubation.

## 2013-11-12 NOTE — Anesthesia Preprocedure Evaluation (Addendum)
Anesthesia Evaluation  Patient identified by MRN, date of birth, ID band Patient awake    Reviewed: Allergy & Precautions, H&P , NPO status , Patient's Chart, lab work & pertinent test results  Airway Mallampati: II TM Distance: >3 FB Neck ROM: Full    Dental  (+) Edentulous Upper, Dental Advisory Given, Missing   Pulmonary sleep apnea ,    Pulmonary exam normal       Cardiovascular hypertension, Pt. on medications     Neuro/Psych Anxiety Depression    GI/Hepatic GERD-  ,  Endo/Other  diabetes  Renal/GU      Musculoskeletal   Abdominal   Peds  Hematology   Anesthesia Other Findings   Reproductive/Obstetrics                        Anesthesia Physical Anesthesia Plan  ASA: III  Anesthesia Plan: General   Post-op Pain Management:    Induction: Intravenous  Airway Management Planned: Oral ETT and Video Laryngoscope Planned  Additional Equipment:   Intra-op Plan:   Post-operative Plan: Extubation in OR  Informed Consent:   Dental advisory given  Plan Discussed with: CRNA, Anesthesiologist and Surgeon  Anesthesia Plan Comments:       Anesthesia Quick Evaluation

## 2013-11-13 LAB — GLUCOSE, CAPILLARY
Glucose-Capillary: 164 mg/dL — ABNORMAL HIGH (ref 70–99)
Glucose-Capillary: 214 mg/dL — ABNORMAL HIGH (ref 70–99)

## 2013-11-13 MED FILL — Sodium Chloride IV Soln 0.9%: INTRAVENOUS | Qty: 1000 | Status: AC

## 2013-11-13 MED FILL — Sodium Chloride Irrigation Soln 0.9%: Qty: 3000 | Status: AC

## 2013-11-13 MED FILL — Heparin Sodium (Porcine) Inj 1000 Unit/ML: INTRAMUSCULAR | Qty: 30 | Status: AC

## 2013-11-13 NOTE — Anesthesia Postprocedure Evaluation (Signed)
Anesthesia Post Note  Patient: Tyler Lester  Procedure(s) Performed: Procedure(s) (LRB): Posterior cervical decompression fusion, cervical 3-4, cervical 4-5, cervical 5-6, cervical 6-7 with instrumentation and allograft   (LEVEL 4) (N/A)  Anesthesia type: general  Patient location: PACU  Post pain: Pain level controlled  Post assessment: Patient's Cardiovascular Status Stable  Last Vitals:  Filed Vitals:   11/13/13 1403  BP: 102/49  Pulse: 93  Temp: 37.2 C  Resp: 20    Post vital signs: Reviewed and stable  Level of consciousness: sedated  Complications: No apparent anesthesia complications

## 2013-11-13 NOTE — Op Note (Signed)
NAMELUBY, SEAMANS NO.:  [id number]  MEDICAL RECORD NO.:  [id number]  LOCATION:  [id number]                        FACILITY:  [facility]  PHYSICIAN:  Phylliss Bob, MD      DATE OF BIRTH:  [birth year]  DATE OF PROCEDURE:  11/12/2013                              OPERATIVE REPORT   PREOPERATIVE DIAGNOSIS: 1. Profound cervical myelopathy. 2. Severe spinal cord compression at C3-4, C4-5, C5-6. 3. Severe multilevel cervical degenerative disk disease.  POSTOPERATIVE DIAGNOSIS: 1. Profound cervical myelopathy. 2. Severe spinal cord compression at C3-4, C4-5, C5-6. 3. Severe multilevel cervical degenerative disk disease.  PROCEDURE: 1. Posterior cervical decompression, C3-C7. 2. Bilateral laminotomy with partial facetectomy and foraminotomy, C6-     7. 3. Posterior spinal fusion, C3-4, C4-5, C5-6, C6-7. 4. Placement of posterior segmental instrumentation, C3-C7. 5. Use of local autograft. 6. Use of morselized allograft - DBX mix. 7. Application and removal of cranial tongs. 8. Intraoperative use of fluoroscopy.  SURGEON:  Phylliss Bob, MD  ASSISTANT:  Pricilla Holm, [facility]  ANESTHESIA:  General endotracheal anesthesia.  COMPLICATIONS:  None.  DISPOSITION:  Stable.  ESTIMATED BLOOD LOSS:  300 mL.  INDICATIONS FOR SURGERY:  Briefly, Mr. Ratterree is a 68 year old male who I did initially evaluate on September 11, 2013, with ongoing and severe deterioration in balance.  I did review an MRI, which was clearly notable for substantial and profound spinal cord compression at C4-5 and C5-6 in particular, in addition to C3-4.  The patient was also noted to have myelomalacia.  Given the patient's progressive and substantial cervical myelopathy and the findings on his MRI, we did discuss proceeding with a posterior decompression and instrumented posterior spinal fusion.  The patient did elect to proceed.  Of particular note, the patient did cancel his surgery the day prior  to a previously scheduled date.  Also, I did advise that we proceed with a CAT scan of the patient's cervical spine for preoperative planning purposes.  However, the patient was unable to be contacted by the facility where the CAT scan was to be arranged.  I did give him the second date, and I did decide to proceed without a CAT scan, as I did feel that additional and ongoing compression of the patient's cervical spine, given the profound spinal cord compression, would bring with it in increased risk of ongoing neurologic dysfunction, which was likely worsen without surgical intervention.  We therefore did elect to proceed on November 12, 2013.  OPERATIVE DETAILS:  On November 12, 2013, the patient was brought to surgery and general endotracheal anesthesia was administered.  The patient was intubated by the Anesthesia team.  Extension of the patient's neck was minimized, per my direction to the Anesthesia team. A [place] head holder was placed.  The patient was then rolled prone onto hospital bed.  The head was secured to the [place] head holder in a flexed position.  Of note, baseline motor-evoked potentials were obtained by the neurologic monitoring team.  Upon positioning, the patient on the table, there was no change of baseline.  However, I then proceeded with translating the patient's neck posteriorly, and the patient's baseline motor-evoked potentials were decreased, I therefore  flexed the head and removed the tape from the shoulders, and there was a substantial improvement in the motor-evoked potentials.  Of note, the patient did have motor function identified into the hands bilaterally and into one of his two feet upon repositioning his head.  Also of note, motor-evoked potentials were obtained throughout the surgery; and throughout the surgery, there did continue to be present motor evoked potentials in the bilateral hands and in the patient's left foot.  Once the patient  was appropriately positioned, the neck was prepped and draped and a time-out procedure was performed.  Antibiotics were given. I then made a midline incision over the posterior cervical spine.  The fascia was incised at the midline.  The paraspinal musculature was bluntly swept laterally.  Self-retaining retractors were placed.  Of note, I did relax retractors approximately every 20 minutes to optimize perfusion to the soft tissues.  I then subperiosteally exposed the lamina of C3, C4, C5, C6, and C7.  The facet joints were subperiosteally exposed bilaterally and decorticated using a 1.7 mm high-speed bur.  I then prepared the lateral mass screws using a 1.7 mm bur followed by a 12 mm drill, 2.5 mm in diameter.  I then used a 3.0 mm tap at the lateral masses at C3, C4, and C5 bilaterally.  I did angle the trajectory of the lateral masses 30 degrees cephalad and 30 degrees lateral.  I then performed a bilateral C6-7 laminotomy with partial facetectomy and foraminotomy.  In doing so, I was able to fully decompress the exiting C7 nerves bilaterally.  Also, I was able to palpate the superior and medial border of the C7 pedicle.  I then used a 1.7 bur to gain access to the C7 pedicle entry points.  I then used a gearshift probe followed by a 3.5 mm tap.  The tap was advanced to 18 mm.  A ball-tip probe was used to confirm that there was no cortical violation.  The wound was then copiously irrigated with approximately 1 L of normal saline.  I then proceeded with the laminectomy portion of the procedure.  Using a 2 mm high-speed bur, I did develop a 14 mm laminectomy defect on the right and on the left at C4, C5, and C6.  With my physician assistant holding upward tension on the posterior lamina, I did release all the soft tissue adhesions to the lamina, and the lamina was removed en bloc at C4, C5, and C6.  The spinal cord was noted. Again, the wound was copiously irrigated.  At this point, I  did use a 3 mm high-speed bur to decorticate the posterior elements.  14 x 3.5 mm screws were placed in the lateral masses at C3, C4, and C5.  A 3.5 x 18 mm screw was placed into the pedicle of C7 bilaterally.  I did note an excellent press fit of each of the screws.  Posterior rods were then fashioned to the appropriate length and the appropriate contour.  They were secured into the screws from C3-C7.  Caps were placed and a final locking procedure was performed.  Of note, a cross-link was placed at the C5 level.  Prior to placing the screws, an abundant amount of autograft obtained from the decompression, in addition to allograft in the form of DBX mixture was packed along the posterior elements.  I then explored the wound for any undue bleeding, and there was none encountered.  There was no extravasation of cerebrospinal fluid noted. I then  closed the fascia using #1 Vicryl.  The subcutaneous layer was then closed using 2-0 Vicryl, and the skin was closed using 3-0 Monocryl.  Benzoin and Steri-Strips were applied followed by sterile dressing.  All instrument counts were correct at the termination of the procedure.  Of note, once the procedure was completed, the patient was rolled supine and the May[place] head holder was removed by me.  The patient was then extubated and transferred to recovery. Patient was able to move feet and Hands in recovery.  Of note, Pricilla Holm was my assistant throughout the entirety of the procedure, and did aid in retraction, suctioning, and closure.     Phylliss Bob, MD     MD/MEDQ  D:  11/12/2013  T:  11/13/2013  Job:  [id number]

## 2013-11-13 NOTE — Progress Notes (Signed)
Utilization review completed.  

## 2013-11-13 NOTE — Progress Notes (Signed)
    Patient doing well, reports resolved numbness in arms, feeling "much better and stronger" had difficulty sleeping through night due to strange environment, loose stool d/c, nurse checked rectal tone and was found to be intact with good sensation, pt eager to eat breakfast and ambulate so he can go home. States he has little to no neck pain.  BP 141/71  Pulse 89  Temp(Src) 98.7 F (37.1 C) (Oral)  Resp 20  Ht 5\' 9"  (1.753 m)  Wt 87.181 kg (192 lb 3.2 oz)  BMI 28.37 kg/m2  SpO2 98%  Dressing in place, hard collar in place worn appropriately, 4+ strength BIL UE's, appears very comfortable but slightly groggy, SCD's in place, NVI  POD #1 s/p C3-7 Posterior Cervical Decompression and Fusion  - Pt reports feeling much improved, no numbness/pain in BIL arms, has not ambulated yet to eval balance - minimal PO neck pain, well controlled  - up with nursing encourage ambulation - Percocet for pain, Valium for muscle spasms - likely d/c home today pending ambulating, food intake and NL bowel/bladder function  -Importance of PO instructions reinforced with pt, hx of non-compliance -D/C scritps signed in chart - D/C orders printed in chart  - Awaiting Philly collar to bedside

## 2013-11-13 NOTE — Plan of Care (Signed)
Problem: Consults Goal: Diagnosis - Spinal Surgery Cervical Spine Fusion     

## 2013-11-13 NOTE — Progress Notes (Signed)
D/C instructions and scripts given to Pt. Pt was able to walk in hall with assistance and felt comfortable with the plan to be D/Cd home today. He reviewed instructions with me and his girlfriend [place] and understood how to care for himself at home. Pt leaving at this time with his girlfriend.

## 2013-11-17 ENCOUNTER — Encounter (HOSPITAL_COMMUNITY): Payer: Self-pay | Admitting: Orthopedic Surgery

## 2013-11-19 NOTE — Discharge Summary (Signed)
Patient ID: Tyler Lester MRN: [id number] DOB/AGE: 66-Mar-1947 66 y.o.  Admit date: 11/12/2013 Discharge date:   Admission Diagnoses:  Active Problems:   Myelopathy   Discharge Diagnoses:  Same  Past Medical History  Diagnosis Date  . Diabetes mellitus   . Chronic pain   . Hypertension   . Hyperlipidemia   . Sleep apnea   . Shortness of breath   . Inguinal hernia     right  . Pneumonia   . Numbness     T/O    Surgeries: Procedure(s): Posterior cervical decompression fusion, cervical 3-4, cervical 4-5, cervical 5-6, cervical 6-7 with instrumentation and allograft   (LEVEL 4) on 11/12/2013   Consultants:    Discharged Condition: Improved  Hospital Course: Tyler Lester is an 68 y.o. male who was admitted 11/12/2013 for operative treatment of <principal problem not specified>. Patient has severe unremitting pain that affects sleep, daily activities, and work/hobbies. After pre-op clearance the patient was taken to the operating room on 11/12/2013 and underwent  Procedure(s): Posterior cervical decompression fusion, cervical 3-4, cervical 4-5, cervical 5-6, cervical 6-7 with instrumentation and allograft   (LEVEL 4).    Patient was given perioperative antibiotics:  Anti-infectives   Start     Dose/Rate Route Frequency Ordered Stop   11/13/13 1000  terbinafine (LAMISIL) tablet 250 mg  Status:  Discontinued     250 mg Oral Daily 11/12/13 1946 11/13/13 1846   11/12/13 2200  ceFAZolin (ANCEF) IVPB 1 g/50 mL premix     1 g 100 mL/hr over 30 Minutes Intravenous Every 8 hours 11/12/13 1946 11/13/13 0630   11/12/13 0600  ceFAZolin (ANCEF) IVPB 2 g/50 mL premix     2 g 100 mL/hr over 30 Minutes Intravenous On call to O.R. 11/11/13 1405 11/12/13 1536       Patient was given sequential compression devices, early ambulation to prevent DVT.  Patient benefited maximally from hospital stay and there were no complications.    Recent vital signs: No data found.    Discharge  Medications:     Medication List    STOP taking these medications       cyclobenzaprine 10 MG tablet  Commonly known as:  FLEXERIL     HYDROcodone-acetaminophen 7.5-325 MG per tablet  Commonly known as:  NORCO      TAKE these medications       atorvastatin 20 MG tablet  Commonly known as:  LIPITOR  Take 1 tablet (20 mg total) by mouth daily.     benzonatate 100 MG capsule  Commonly known as:  TESSALON PERLES  Take 1 capsule (100 mg total) by mouth 3 (three) times daily as needed for cough.     Cane Misc  1 Device by Does not apply route daily. Please fit one point cane for balance and support     clonazePAM 0.5 MG tablet  Commonly known as:  KLONOPIN  Take 0.5 mg by mouth 2 (two) times daily as needed for anxiety.     dextromethorphan-guaiFENesin 30-600 MG per 12 hr tablet  Commonly known as:  MUCINEX DM  Take 1 tablet by mouth 2 (two) times daily.     FLUoxetine 20 MG capsule  Commonly known as:  PROZAC  Take 1 capsule (20 mg total) by mouth daily.     gabapentin 100 MG capsule  Commonly known as:  NEURONTIN  Take 1 capsule (100 mg total) by mouth 3 (three) times daily.  glimepiride 4 MG tablet  Commonly known as:  AMARYL  Take 1 tablet (4 mg total) by mouth daily.     glucose blood test strip  Commonly known as:  ONE TOUCH ULTRA TEST  Check sugar 6 x daily     ipratropium 0.06 % nasal spray  Commonly known as:  ATROVENT  Place 2 sprays into both nostrils 4 (four) times daily.     ketoconazole 2 % cream  Commonly known as:  NIZORAL  Apply 1 application topically daily.     lisinopril 10 MG tablet  Commonly known as:  PRINIVIL,ZESTRIL  Take 1 tablet (10 mg total) by mouth daily.     Lumbar Back Brace/Support Pad Misc  1 Device by Does not apply route daily.     metFORMIN 1000 MG tablet  Commonly known as:  GLUCOPHAGE  Take 1 tablet (1,000 mg total) by mouth 2 (two) times daily with a meal.     omeprazole 20 MG capsule  Commonly known as:   PRILOSEC  Take 1 capsule (20 mg total) by mouth daily.     ONE TOUCH ULTRA 2 W/DEVICE Kit  Please dispense one unit. Patient to check blood sugar once every morning before breakfast.     onetouch ultrasoft lancets  Use as instructed     pantoprazole 40 MG tablet  Commonly known as:  PROTONIX  Take 40 mg by mouth daily.     potassium chloride 20 MEQ packet  Commonly known as:  KLOR-CON  Take 20 mEq by mouth 3 (three) times daily.     tamsulosin 0.4 MG Caps capsule  Commonly known as:  FLOMAX  Take 2 capsules (0.8 mg total) by mouth daily.     terbinafine 250 MG tablet  Commonly known as:  LAMISIL  Take 1 tablet (250 mg total) by mouth daily.     Vitamin D3 400 UNITS tablet  Take 2 tablets (800 Units total) by mouth daily.        Diagnostic Studies: Dg Cervical Spine 2 Or 3 Views  11/12/2013   CLINICAL DATA:  Postop C3-C7.  Posterior decompression and fusion.  EXAM: CERVICAL SPINE - 2-3 VIEW  COMPARISON:  MRI 11/12/2013  FINDINGS: The patient has had posterior fusion C3 through C7. There is significant degenerative change in the midcervical spine. There is loss of lordosis. Alignment is otherwise normal.  IMPRESSION: Status post posterior fusion C3-C7.   Electronically Signed   By: Shon Hale M.D.   On: 11/12/2013 22:12   Dg Cervical Spine 2-3 Views  11/12/2013   CLINICAL DATA:  Posterior cervical fusion  EXAM: CERVICAL SPINE - 2-3 VIEW  COMPARISON:  MRI of the cervical spine of Jul 22, 2013  FINDINGS: The initial film was labeled #1 and timed at 12:37p.m. on November 12, 2013. The metallic trocars overlie the posterior aspect of the C4 spinous process. Surgical sponge material extends from this level inferiorly to C7.  The follow-up film labeled portable AP cross table in timed is 1634 hr reveals the patient to have undergone posterior fusion from C3 through C7. No definite pedicle screws are demonstrated in C6.  IMPRESSION: The intraoperative films labeled as described reveal  preoperative localization of the surgical site and the subsequent post fusion changes.   Electronically Signed   By: David  Martinique   On: 11/12/2013 17:05   Mr Cervical Spine Wo Contrast  11/12/2013   CLINICAL DATA:  68 year old male post surgery cervical spine earlier this afternoon now presenting  with on going and progressive deterioration with balance.  EXAM: MRI CERVICAL SPINE WITHOUT CONTRAST  TECHNIQUE: Multiplanar, multisequence MR imaging of the cervical spine was performed. No intravenous contrast was administered.  COMPARISON:  11/12/2013 intraoperative plain film examination. 07/22/2013 MR.  FINDINGS: Patient has undergone recent posterior decompression C4 through C6 with fusion utilizing facet screws and posterior connecting bar which contribute to artifact slightly limiting the present exam. Exact positioning of the screws is indeterminate by MR and if further delineation of position of hardware is clinically desired, CT imaging may be considered.  Baseline narrowed spinal canal secondary to short pedicles. Superimposed cervical spondylotic changes continued to contribute to compression of the ventral aspect of the cord (as detailed below). C4 through C6 posterior decompression via laminectomy with postoperative collection at the laminectomy site which does not appear to be causing significant mass effect upon the cord which draped slightly posteriorly.  Focal altered signal intensity within the cord at the C5-6 level was noted on the preoperative exam most consistent with myelomalacia from chronic compression.  Small amount of fluid in the left supraclavicular region may be related to fluid extending to this region from postoperative changes. Mild narrowing of the pharynx greater on the right. Pooled secretions may contribute to this appearance.  Postoperative fluid extends from the operative site inferiorly to the upper thoracic region within the paraspinal muscles which may represent early expected  postoperative changes.  Grossly, the vertebral arteries appear patent.  C2-3:  Bulge.  C3-4: Broad-based protrusion greatest right paracentral position with cord compression which appears more prominent than on the prior MR.  C4-5: Posterior decompression. Broad-based disc osteophyte complex with ventral cord compression.  C5-6: Posterior decompression. Broad-based disc osteophyte with ventral cord compression.  C6-7:  Bulge.  Mild spinal stenosis with mild cord contact.  C7-T1: Negative.  Evaluation of the neural foramen limited by metallic artifact.  Decreased signal intensity of bone marrow may be related to patient's habitus. Correlation with CBC to exclude anemia contributing to this appearance may be considered  IMPRESSION: Post cervical spine surgery today with C4 through C6 posterior laminectomy/decompression. The fluid collection within the operative site does not appear to be causing significant mass effect upon the cord (which is draped slightly posteriorly). The disc disease at the C4-5 through the C6-7 level continues to cause mass effect upon the ventral aspect of the cord as detailed above.  The degree of spinal stenosis at the C3-4 level has progressed since the prior exam.  Focal area of altered signal intensity within the cord at C5-6 level was noted on preoperative exam is felt to represent myelomalacia.  Hardware is incompletely assessed by MR and would require CT imaging if further evaluation is clinically desired.  Postop per fluid within the paraspinal muscles inferior to the operative site extending into the upper thoracic region may represent expected postoperative changes. Clinical correlation recommended.  Postoperative changes within soft tissue of the neck as noted above.  These results were called by telephone at the time of interpretation on 11/12/2013 at 8:28 pm to Barton Dubois, PA who verbally acknowledged these results.   Electronically Signed   By: Chauncey Cruel M.D.   On:  11/12/2013 20:29    Disposition: 01-Home or Self Care   POD #1 s/p C3-7 Posterior Cervical Decompression and Fusion   - Pt reports feeling much improved, no numbness/pain in BIL arms, has not ambulated yet to eval balance   - Minimal PO neck pain, well controlled   -  Up with nursing encourage ambulation   - Percocet for pain, Valium for muscle spasms   - Likely d/c home today pending ambulating, food intake and NL bowel/bladder function   - Importance of PO instructions reinforced with pt, hx of non-compliance   - D/C scritps signed in chart   - D/C orders printed in chart   - Awaiting [place] collar to bedside   Signed: Justice Britain 11/19/2013, 9:09 AM

## 2013-12-01 ENCOUNTER — Telehealth: Payer: Self-pay | Admitting: Family Medicine

## 2013-12-01 NOTE — Telephone Encounter (Signed)
Needs refill on pain meds

## 2013-12-02 NOTE — Telephone Encounter (Signed)
Patient called back for request of pain med.  Provider have nothing available for remainder of month for appts.

## 2013-12-02 NOTE — Telephone Encounter (Signed)
Patient not due for refill on narcotics. Last prescribed on 9/14. Patient will not receive prescription until one is due for narcotics.

## 2013-12-03 ENCOUNTER — Ambulatory Visit: Payer: PRIVATE HEALTH INSURANCE | Admitting: Family Medicine

## 2013-12-03 NOTE — Telephone Encounter (Signed)
Patient informed. 

## 2013-12-15 ENCOUNTER — Ambulatory Visit (INDEPENDENT_AMBULATORY_CARE_PROVIDER_SITE_OTHER): Payer: PRIVATE HEALTH INSURANCE | Admitting: Family Medicine

## 2013-12-15 ENCOUNTER — Encounter: Payer: Self-pay | Admitting: Family Medicine

## 2013-12-15 ENCOUNTER — Other Ambulatory Visit: Payer: Self-pay | Admitting: *Deleted

## 2013-12-15 VITALS — BP 130/70 | HR 84 | Temp 97.9°F | Ht 69.0 in | Wt 190.0 lb

## 2013-12-15 DIAGNOSIS — M940 Chondrocostal junction syndrome [Tietze]: Secondary | ICD-10-CM

## 2013-12-15 DIAGNOSIS — R05 Cough: Secondary | ICD-10-CM

## 2013-12-15 DIAGNOSIS — G8929 Other chronic pain: Secondary | ICD-10-CM

## 2013-12-15 DIAGNOSIS — R059 Cough, unspecified: Secondary | ICD-10-CM

## 2013-12-15 MED ORDER — HYDROCODONE-ACETAMINOPHEN 7.5-325 MG PO TABS: 1.0000 | ORAL_TABLET | Freq: Three times a day (TID) | ORAL | Status: DC | PRN

## 2013-12-15 NOTE — Progress Notes (Signed)
    Subjective    Tyler Lester is a 68 y.o. male that presents for an office visit.   1. Chronic pain: Was put on pain meds but says they did not help him. Currently in a collar. Has a home health nurse. Patient can bathe himself. Uses a cane to move around. Has fallen twice, last a week ago. Did not hurt himself. Orthopedic surgeon on 10/27.  History  Substance Use Topics  . Smoking status: Never Smoker   . Smokeless tobacco: Never Used  . Alcohol Use: Yes     Comment: rare alcohol /beer    No Known Allergies  No orders of the defined types were placed in this encounter.    ROS  Per HPI   Objective   BP 130/70  Pulse 84  Temp(Src) 97.9 F (36.6 C) (Oral)  Ht 5\' 9"  (1.753 m)  Wt 190 lb (86.183 kg)  BMI 28.05 kg/m2  General: Fair appearing, no acute distress  Assessment and Plan   Please refer to problem based charting of assessment and plan

## 2013-12-15 NOTE — Patient Instructions (Addendum)
Thank you for coming to see me today. It was a pleasure. Today we talked about:   Chronic pain: I have refilled your Norco.  Please make an appointment to see me in 4 weeks for a follow-up appointment.  If you have any questions or concerns, please do not hesitate to call the office at [phone].  Sincerely,  Cordelia Poche, MD

## 2013-12-24 ENCOUNTER — Telehealth: Payer: Self-pay | Admitting: *Deleted

## 2013-12-24 NOTE — Assessment & Plan Note (Signed)
Refill opiates. Falls seem related to usage of cane rather than disorientation. Will still eventually discuss titration of opiates.

## 2013-12-24 NOTE — Telephone Encounter (Signed)
Pt called stating he has been having a lot of shaking in his left arm and mouth for the past 2-3 weeks.  Pt denies any chest pain, visual changes or SOB.  Pt complained of weakness, numbness of both arms.  Pt advised to go to urgent care or emergency department for evaluation; no appts at [facility] today.  Pt requested a referral to a neurologist.  Will forward to PCP for further advise.

## 2013-12-25 ENCOUNTER — Ambulatory Visit: Payer: PRIVATE HEALTH INSURANCE | Admitting: Family Medicine

## 2013-12-30 ENCOUNTER — Ambulatory Visit (INDEPENDENT_AMBULATORY_CARE_PROVIDER_SITE_OTHER): Payer: PRIVATE HEALTH INSURANCE | Admitting: Family Medicine

## 2013-12-30 ENCOUNTER — Encounter: Payer: Self-pay | Admitting: Family Medicine

## 2013-12-30 VITALS — BP 135/70 | HR 80 | Temp 98.1°F | Ht 69.0 in | Wt 195.4 lb

## 2013-12-30 DIAGNOSIS — G8929 Other chronic pain: Secondary | ICD-10-CM

## 2013-12-30 DIAGNOSIS — R251 Tremor, unspecified: Secondary | ICD-10-CM

## 2013-12-30 MED ORDER — HYDROCODONE-ACETAMINOPHEN 7.5-325 MG PO TABS: 1.0000 | ORAL_TABLET | Freq: Three times a day (TID) | ORAL | Status: DC | PRN

## 2013-12-30 NOTE — Progress Notes (Signed)
    Subjective    Tyler Lester is a 68 y.o. male that presents for an office visit.   1. Hand shaking: started about one year ago. It gets worse intermittently but has gotten much worse in the last few months. Now has trouble holding a plate. He has associated weakness. Has a history of a fall from his bed about one week ago due to the weakness in his hand. Has a history of cervical spine fusion on 9/17. Symptoms have improved since then. Last seen spinal surgeon a few weeks ago. Is supposed to follow-up in another three months.   2. Back pain: Chronic issue. Worse when laying down at night. Also painful when he gets up, but eventually eases off during the day.  History  Substance Use Topics  . Smoking status: Never Smoker   . Smokeless tobacco: Never Used  . Alcohol Use: Yes     Comment: rare alcohol /beer    Allergies  Allergen Reactions  . Tramadol Itching    No orders of the defined types were placed in this encounter.    ROS  Per HPI   Objective   BP 135/70 mmHg  Pulse 80  Temp(Src) 98.1 F (36.7 C) (Oral)  Ht 5\' 9"  (1.753 m)  Wt 195 lb 6.4 oz (88.633 kg)  BMI 28.84 kg/m2  General: fair appearing, no distress Neuro: Pill rolling tremor noted in left hand. Tremor of his mouth noted. No CN deficits noticed. Shuffling gait with multiple steps to turn  Assessment and Plan   Please refer to problem based charting of assessment and plan

## 2013-12-30 NOTE — Patient Instructions (Signed)
Thank you for coming to see me today. It was a pleasure. Today we talked about:   Hand tremor: I will refer you to neurology. I am concerned that this may be related to Parkinson Disease.  Back pain: I will refill your medication. We may discuss treatment but for now, I would like you to follow-up with Neurology  Please make an appointment to see me in 4 weeks for follow-up.  If you have any questions or concerns, please do not hesitate to call the office at [phone].  Sincerely,  Cordelia Poche, MD

## 2014-01-05 DIAGNOSIS — R251 Tremor, unspecified: Secondary | ICD-10-CM | POA: Insufficient documentation

## 2014-01-05 NOTE — Assessment & Plan Note (Signed)
Patient showing signs of possible parkinson disease. Will refer to neurology for further assessment.

## 2014-01-08 ENCOUNTER — Ambulatory Visit: Payer: 59 | Admitting: Neurology

## 2014-01-08 ENCOUNTER — Other Ambulatory Visit (HOSPITAL_COMMUNITY): Payer: Self-pay | Admitting: Orthopedic Surgery

## 2014-01-08 DIAGNOSIS — M25571 Pain in right ankle and joints of right foot: Secondary | ICD-10-CM

## 2014-01-13 ENCOUNTER — Ambulatory Visit (HOSPITAL_COMMUNITY): Payer: PRIVATE HEALTH INSURANCE

## 2014-01-15 ENCOUNTER — Ambulatory Visit (HOSPITAL_COMMUNITY)
Admission: RE | Admit: 2014-01-15 | Discharge: 2014-01-15 | Disposition: A | Discharge status: Home / Self Care | Payer: PRIVATE HEALTH INSURANCE | Source: Ambulatory Visit | Attending: Orthopedic Surgery | Admitting: Orthopedic Surgery

## 2014-01-15 ENCOUNTER — Encounter: Payer: Self-pay | Admitting: Neurology

## 2014-01-15 DIAGNOSIS — M25571 Pain in right ankle and joints of right foot: Secondary | ICD-10-CM

## 2014-01-15 DIAGNOSIS — I70203 Unspecified atherosclerosis of native arteries of extremities, bilateral legs: Secondary | ICD-10-CM | POA: Diagnosis not present

## 2014-01-15 DIAGNOSIS — M79609 Pain in unspecified limb: Secondary | ICD-10-CM

## 2014-01-15 NOTE — Progress Notes (Signed)
VASCULAR LAB PRELIMINARY  ARTERIAL  ABI completed:    RIGHT    LEFT    PRESSURE WAVEFORM  PRESSURE WAVEFORM  BRACHIAL 142 Triphasic BRACHIAL 124 Triphasic  DP 135 Monophasic  DP 141  Monophasic   AT   AT    PT 137 Triphasic  PT 159 Triphasic   PER   PER    GREAT TOE  NA GREAT TOE  NA    RIGHT LEFT  ABI 0.96 1.12   Duplex imaging of the right lower extremity:  Minimal to mild plaque throughout.  Triphasic waveform in the CFA.  Biphasic waveforms in the FA and popliteal a.  50% stenosis in the proximal FA.    Kaiden Pech, RVT 01/15/2014, 6:23 PM

## 2014-01-19 ENCOUNTER — Telehealth: Payer: Self-pay | Admitting: Family Medicine

## 2014-01-19 NOTE — Telephone Encounter (Signed)
Pt called and would like the lab results that were taken at the hospital. jw

## 2014-01-19 NOTE — Telephone Encounter (Signed)
Pt talking about LE doppler. Tyler Lester, Tyler Lester

## 2014-01-28 ENCOUNTER — Ambulatory Visit: Payer: PRIVATE HEALTH INSURANCE | Admitting: Family Medicine

## 2014-01-28 ENCOUNTER — Telehealth: Payer: Self-pay | Admitting: Family Medicine

## 2014-01-28 NOTE — Telephone Encounter (Signed)
Pt called and needs the doctor to write a letter to [facility] care stating that he has parkinson disease. Please fax to [facility] [phone]. If you need additional information please call [place] at [phone]. jw.

## 2014-01-29 NOTE — Telephone Encounter (Signed)
I haven't diagnosed him with Parkinson Disease. I just referred him to neurology for a formal evaluation. If he has seen neurology and if they have told him he has Parkinson Disease, he should request a letter from his neurologist's office.

## 2014-01-29 NOTE — Telephone Encounter (Signed)
LVM for patient to call back to inform of below message

## 2014-01-29 NOTE — Telephone Encounter (Signed)
Was going to discuss at office visit but patient did not come. Overall, patient does not have current PAD. Will inform patient of results.

## 2014-02-03 ENCOUNTER — Ambulatory Visit (INDEPENDENT_AMBULATORY_CARE_PROVIDER_SITE_OTHER): Payer: Medicare Other | Admitting: Neurology

## 2014-02-03 ENCOUNTER — Encounter: Payer: Self-pay | Admitting: Neurology

## 2014-02-03 VITALS — BP 152/82 | HR 77 | Ht 69.0 in | Wt 199.8 lb

## 2014-02-03 DIAGNOSIS — G20 Parkinson's disease: Secondary | ICD-10-CM

## 2014-02-03 MED ORDER — RASAGILINE MESYLATE 0.5 MG PO TABS: 0.5000 mg | ORAL_TABLET | Freq: Every day | ORAL | Status: DC

## 2014-02-03 NOTE — Progress Notes (Addendum)
[facility]    Provider:  Dr Jaynee Eagles Referring Provider: Cordelia Poche, MD Primary Care Physician:  Cordelia Poche, MD  CC:  Tremor  HPI:  Tyler Lester is a 68 y.o. male here as a referral from Dr. Lonny Prude for Tremor. PMHx cervical fusion (September), chronic back pain, DM, HTN, fatigue, depression, anxiety, myelopathy.   Was sent here by Dr. Teryl Lucy for evaluation of tremor. He has a tremor in his left hand and his jaw.  Tremor has only been going on for at least a year but worse in the last 3 months. Getting worse after neck surgery. Worse in the left but also in the right hand as well as the chin. He can't walk, he has chronic pain in his low back and neck. Walking slow, doesn't have energy. No tremors in other family members. Doesn't notice the tremor getting better with alcohol. No constipation. Can hardly smell. Sometimes he is talking low, softer. Hand writing is shaky, smaller. Feels weak. TSH was normal in June. Does not drink caffeine. 1-2 drinks a week, no significant alcohol. No FHx of parkinson's or neurodegenerative disease. Has had falls.   Review of Systems: Patient complains of symptoms per HPI as well as the following symptoms: swelling in legs, weakness. Pertinent negatives per HPI. All others negative.   History   Social History  . Marital Status: Single    Spouse Name: N/A    Number of Children: 2  . Years of Education: 12   Occupational History  . Retired    . Disability    Social History Main Topics  . Smoking status: Never Smoker   . Smokeless tobacco: Never Used  . Alcohol Use: Yes     Comment: rare alcohol /beer  . Drug Use: No  . Sexual Activity: Not on file   Other Topics Concern  . Not on file   Social History Narrative   Patient lives at home alone.    Patient have 2 children.    Patient has a high school education    Patient is right handed.    Patient is retired on disability.        Family History  Problem Relation  Age of Onset  . Cancer - Other Other   . Diabetes Other   . Hypertension Other     Past Medical History  Diagnosis Date  . Diabetes mellitus   . Chronic pain   . Hypertension   . Hyperlipidemia   . Sleep apnea   . Shortness of breath   . Inguinal hernia     right  . Pneumonia   . Numbness     T/O    Past Surgical History  Procedure Laterality Date  . Colon surgery    . Cataract extraction w/ intraocular lens implant Left 06/2012    laser for posterior capsule?  Marland Kitchen Tonsillectomy    . Posterior cervical fusion/foraminotomy N/A 11/12/2013    Procedure: Posterior cervical decompression fusion, cervical 3-4, cervical 4-5, cervical 5-6, cervical 6-7 with instrumentation and allograft   (LEVEL 4);  Surgeon: Sinclair Ship, MD;  Location: [facility];  Service: Orthopedics;  Laterality: N/A;  Posterior cervical decompression fusion, cervical 3-4, cervical 4-5, cervical 5-6, cervical 6-7 with instrumentation and allograft    Current Outpatient Prescriptions  Medication Sig Dispense Refill  . atorvastatin (LIPITOR) 20 MG tablet Take 1 tablet (20 mg total) by mouth daily. 90 tablet 3  . benzonatate (TESSALON PERLES) 100 MG capsule Take 1  capsule (100 mg total) by mouth 3 (three) times daily as needed for cough. 20 capsule 0  . Blood Glucose Monitoring Suppl (ONE TOUCH ULTRA 2) W/DEVICE KIT Please dispense one unit. Patient to check blood sugar once every morning before breakfast. 1 each 0  . Cholecalciferol (VITAMIN D3) 400 UNITS tablet Take 2 tablets (800 Units total) by mouth daily. 60 tablet 1  . clonazePAM (KLONOPIN) 0.5 MG tablet Take 0.5 mg by mouth 2 (two) times daily as needed for anxiety.     Marland Kitchen dextromethorphan-guaiFENesin (MUCINEX DM) 30-600 MG per 12 hr tablet Take 1 tablet by mouth 2 (two) times daily. 14 tablet 0  . Elastic Bandages & Supports (LUMBAR BACK BRACE/SUPPORT PAD) MISC 1 Device by Does not apply route daily. 1 each 0  . FLUoxetine (PROZAC) 20 MG capsule Take 1  capsule (20 mg total) by mouth daily. 30 capsule 2  . gabapentin (NEURONTIN) 100 MG capsule Take 1 capsule (100 mg total) by mouth 3 (three) times daily. 90 capsule 1  . glimepiride (AMARYL) 4 MG tablet Take 1 tablet (4 mg total) by mouth daily. 90 tablet 6  . glucose blood (ONE TOUCH ULTRA TEST) test strip Check sugar 6 x daily 200 each 3  . HYDROcodone-acetaminophen (NORCO) 7.5-325 MG per tablet Take 1 tablet by mouth every 8 (eight) hours as needed for moderate pain. 90 tablet 0  . ipratropium (ATROVENT) 0.06 % nasal spray Place 2 sprays into both nostrils 4 (four) times daily. 15 mL 12  . ketoconazole (NIZORAL) 2 % cream Apply 1 application topically daily.    . Lancets (ONETOUCH ULTRASOFT) lancets Use as instructed 100 each 12  . lisinopril (PRINIVIL,ZESTRIL) 10 MG tablet Take 1 tablet (10 mg total) by mouth daily. 90 tablet 3  . metFORMIN (GLUCOPHAGE) 1000 MG tablet Take 1 tablet (1,000 mg total) by mouth 2 (two) times daily with a meal. 180 tablet 6  . Misc. Devices (CANE) MISC 1 Device by Does not apply route daily. Please fit one point cane for balance and support 1 each 0  . omeprazole (PRILOSEC) 20 MG capsule Take 1 capsule (20 mg total) by mouth daily. 30 capsule 3  . pantoprazole (PROTONIX) 40 MG tablet Take 40 mg by mouth daily.    . potassium chloride (KLOR-CON) 20 MEQ packet Take 20 mEq by mouth 3 (three) times daily.    . tamsulosin (FLOMAX) 0.4 MG CAPS capsule Take 2 capsules (0.8 mg total) by mouth daily. 60 capsule 1  . terbinafine (LAMISIL) 250 MG tablet Take 1 tablet (250 mg total) by mouth daily. 45 tablet 0  . rasagiline (AZILECT) 0.5 MG TABS tablet Take 1 tablet (0.5 mg total) by mouth daily. 30 tablet 3   No current facility-administered medications for this visit.    Allergies as of 02/03/2014 - Review Complete 02/03/2014  Allergen Reaction Noted  . Tramadol Itching 12/30/2013    Vitals: BP 152/82 mmHg  Pulse 77  Ht _0  (1.753 m)  Wt 199 lb 12.8 oz (90.629  kg)  BMI 29.49 kg/m2 Last Weight:  Wt Readings from Last 1 Encounters:  02/03/14 199 lb 12.8 oz (90.629 kg)   Last Height:   Ht Readings from Last 1 Encounters:  02/03/14 _1  (1.753 m)    Physical exam: Exam: Gen: NAD, conversant, well nourised                  CV: RRR, no MRG. No Carotid Bruits. Eyes: Conjunctivae clear without exudates  or hemorrhage  Neuro: Detailed Neurologic Exam  Speech: Mildly hypophonic. No aphasia, not dysarthric Cognition:    The patient is oriented to person, place, and time;     recent and remote memory intact;     language fluent;     normal attention, concentration,     fund of knowledge Cranial Nerves:    The pupils are equal, round, and reactive to light. The fundi are normal and spontaneous venous pulsations are present. Visual fields are full to finger confrontation. Extraocular movements are intact. Trigeminal sensation is intact and the muscles of mastication are normal. The face is symmetric. The palate elevates in the midline. Voice is normal. Shoulder shrug is normal. The tongue has normal motion without fasciculations.   Coordination: Bradykinesias left > right finger tapping, slow but intact FTN, no dysmetria  Gait:  antalgic. Difficult to assess for shuffling and arm swing because he has chronic pain, recent neck surgery, chronic low back pain and uses a cane.    Motor Observation: Left>R resting tremor and chin tremor. Decreased blink. Postural tremor.      Tone:    No cogwheeling  Posture:    Posture is normal. normal erect    Strength:    Giveway due to pain     Sensation: intact     Reflex Exam:  DTR's:    Deep tendon reflexes in the upper and lower extremities are symmetric 1+ bilaterally.   Toes:    The toes are withdrawal bilaterally.   Clonus:    Clonus is absent.      Assessment/Plan:  68 year old male here for evaluation of tremor. PMHx cervical fusion (September), chronic back pain, DM, HTN, fatigue,  depression, anxiety, myelopathy. Has some parkinsonian features, left > right resting tremor, bradykinesia left > right on finger tapping, mild hypophonia, masked facies. No cogwheeling. Antalgic gait with small strides however difficult to assess for shuffling or arm swing because he has chronic pain,  recent neck surgery, chronic low back pain, neuropathy and uses a cane.    MRI of the brain Will start Azilect and follow clinically Do not take Dextromethorphan (cold medicine) with Azilect Follow up in 6 weeks    Sarina Ill, MD  [facility] [address] [place], [place] [address]  Phone [phone] Fax [phone] Lenor Coffin

## 2014-02-03 NOTE — Patient Instructions (Signed)
Overall you are doing fairly well but I do want to suggest a few things today:   Remember to drink plenty of fluid, eat healthy meals and do not skip any meals. Try to eat protein with a every meal and eat a healthy snack such as fruit or nuts in between meals. Try to keep a regular sleep-wake schedule and try to exercise daily, particularly in the form of walking, 20-30 minutes a day, if you can.   As far as your medications are concerned, I would like to suggest: Azilect 0.5 mg (1 tab daily). Do not take Dextromethorphan(cold medicine) with this as it increases the risk of side effects. If you experience significant side effects such as dizziness, low blood pressure, falls, increased heart rate, sweating, jerking or twitching or any concerning symptoms please stop and call the office or proceed to the ED (call 911)  As far as diagnostic testing: MRI of the brain  I would like to see you back in 6 weeks, sooner if we need to. Please call us with any interim questions, concerns, problems, updates or refill requests.   Please also call us for any test results so we can go over those with you on the phone.  My clinical assistant and will answer any of your questions and relay your messages to me and also relay most of my messages to you.   Our phone number is [phone]. We also have an after hours call service for urgent matters and there is a physician on-call for urgent questions. For any emergencies you know to call 911 or go to the nearest emergency room

## 2014-02-05 ENCOUNTER — Ambulatory Visit (INDEPENDENT_AMBULATORY_CARE_PROVIDER_SITE_OTHER): Payer: PRIVATE HEALTH INSURANCE | Admitting: Family Medicine

## 2014-02-05 ENCOUNTER — Encounter: Payer: Self-pay | Admitting: Family Medicine

## 2014-02-05 VITALS — BP 159/73 | HR 81 | Temp 97.8°F | Ht 69.0 in | Wt 199.5 lb

## 2014-02-05 DIAGNOSIS — G8929 Other chronic pain: Secondary | ICD-10-CM

## 2014-02-05 DIAGNOSIS — E1149 Type 2 diabetes mellitus with other diabetic neurological complication: Secondary | ICD-10-CM

## 2014-02-05 DIAGNOSIS — E1140 Type 2 diabetes mellitus with diabetic neuropathy, unspecified: Secondary | ICD-10-CM

## 2014-02-05 DIAGNOSIS — I10 Essential (primary) hypertension: Secondary | ICD-10-CM

## 2014-02-05 MED ORDER — HYDROCODONE-ACETAMINOPHEN 7.5-325 MG PO TABS: 1.0000 | ORAL_TABLET | Freq: Three times a day (TID) | ORAL | Status: DC | PRN

## 2014-02-05 MED ORDER — SENNA 8.6 MG PO TABS: 2.0000 | ORAL_TABLET | Freq: Every day | ORAL | Status: DC

## 2014-02-05 NOTE — Patient Instructions (Signed)
Thank you for coming to see me today. It was a pleasure. Today we talked about:   High blood pressure: your blood pressure was a little high today. I want you to make an appointment to see our nurse next week for a recheck of your blood pressure  Chronic pain: I have refilled your Norco. I have also prescribed you Senna to make sure you do not develop worsened bowel issues  Diabetes: No changes to medications. I will order a meter with test strips and lancets.  Please make an appointment to see me in one month for follow-up.  If you have any questions or concerns, please do not hesitate to call the office at [phone].  Sincerely,  Cordelia Poche, MD

## 2014-02-05 NOTE — Progress Notes (Signed)
    Subjective    Tyler Lester is a 68 y.o. male that presents for an office visit.   1. Chronic pain: Pain controlled with Vicodin. No falls. Patient states he is walking better with less pain in his hips. Having bowel movements every few days. Last bowel movement yesterday.   2. Hypertension: Adherent to regimen, although has not taken blood pressure medication yet today. Usually takes medication in the morning but states he forgot to take it after leaving home. No headaches, chest pain or shortness of breath.  3. Diabetes mellitus: Patient has not been checking blood sugar because he does not have equipment at home. No nausea, vomiting, lightheadedness, seizures. Adherent with metformin and glimepiride.  4. Desire for DME: Patient states when he is taking a shower, he has trouble standing in the tub for long periods of time. He has not fallen in the bathtub.  History  Substance Use Topics  . Smoking status: Never Smoker   . Smokeless tobacco: Never Used  . Alcohol Use: Yes     Comment: rare alcohol /beer    Allergies  Allergen Reactions  . Tramadol Itching    No orders of the defined types were placed in this encounter.    ROS  Per HPI   Objective   BP 159/73 mmHg  Pulse 81  Temp(Src) 97.8 F (36.6 C) (Oral)  Ht 5\' 9"  (1.753 m)  Wt 199 lb 8 oz (90.493 kg)  BMI 29.45 kg/m2  General: Fair appearing Neuro: Tremor of left hand noticed.  Assessment and Plan   Please refer to problem based charting of assessment and plan

## 2014-02-08 NOTE — Assessment & Plan Note (Signed)
Patient's blood pressure not controlled today although patient has not taken medication. Will have patient follow-up next week for a nurse visit. Otherwise, will keep regimen unchanged.

## 2014-02-08 NOTE — Assessment & Plan Note (Signed)
Patient not checking blood sugars. No evidence of hypoglycemia per patient history. Will prescribe diabetic equipment for home CBG checks. A1C at next office visit.

## 2014-02-08 NOTE — Assessment & Plan Note (Signed)
Patient's pain manageable. Will obtain UDS at next office visit. Will revisit controlled substance contract at that time.

## 2014-02-22 ENCOUNTER — Ambulatory Visit: Payer: PRIVATE HEALTH INSURANCE | Admitting: *Deleted

## 2014-02-22 VITALS — BP 158/80 | HR 84

## 2014-02-22 DIAGNOSIS — I10 Essential (primary) hypertension: Secondary | ICD-10-CM

## 2014-02-22 NOTE — Progress Notes (Signed)
   Pt in nurse clinic for blood pressure check.  BP 158/80 manually, heart rate 84.  Pt stated he is having some generalized pain.  Advised pt to schedule an appt with PCP.  Continue taking blood pressure medications as prescribed.  Pt stated he is also having a head cold and taking over the counter medications. Advised to be careful when taking OTC with having high blood pressure.  If he is not feeling any better to schedule an appt with provider soon.  Pt stated understanding.  Derl Barrow, RN

## 2014-02-23 ENCOUNTER — Telehealth: Payer: Self-pay | Admitting: Neurology

## 2014-02-23 NOTE — Telephone Encounter (Signed)
Dr Jaynee Eagles, here is a request.

## 2014-02-23 NOTE — Telephone Encounter (Signed)
Patient requesting a letter stating he has Parkinson's disease.  Letter need to be addressed to Philis Pique with [facility] in order to have a caretaker.  Fax # [phone].  Please call and advise.

## 2014-02-24 ENCOUNTER — Inpatient Hospital Stay: Admission: RE | Admit: 2014-02-24 | Payer: PRIVATE HEALTH INSURANCE | Source: Ambulatory Visit

## 2014-02-24 ENCOUNTER — Encounter: Payer: Self-pay | Admitting: Neurology

## 2014-02-24 NOTE — Telephone Encounter (Signed)
Please fax letter i gave you, thanks

## 2014-02-24 NOTE — Telephone Encounter (Signed)
Faxed mh

## 2014-02-26 MED ORDER — GLUCOSE BLOOD VI STRP: ORAL_STRIP | Status: DC

## 2014-02-26 MED ORDER — ONETOUCH ULTRA 2 W/DEVICE KIT: PACK | Status: DC

## 2014-02-26 MED ORDER — ONETOUCH ULTRASOFT LANCETS MISC: Status: DC

## 2014-02-28 ENCOUNTER — Encounter (HOSPITAL_COMMUNITY): Payer: Self-pay | Admitting: Emergency Medicine

## 2014-02-28 ENCOUNTER — Emergency Department (HOSPITAL_COMMUNITY)
Admission: EM | Admit: 2014-02-28 | Discharge: 2014-02-28 | Disposition: A | Discharge status: Home / Self Care | Payer: PRIVATE HEALTH INSURANCE | Attending: Emergency Medicine | Admitting: Emergency Medicine

## 2014-02-28 ENCOUNTER — Emergency Department (HOSPITAL_COMMUNITY): Payer: PRIVATE HEALTH INSURANCE

## 2014-02-28 DIAGNOSIS — M25571 Pain in right ankle and joints of right foot: Secondary | ICD-10-CM | POA: Diagnosis not present

## 2014-02-28 DIAGNOSIS — E780 Pure hypercholesterolemia: Secondary | ICD-10-CM | POA: Insufficient documentation

## 2014-02-28 DIAGNOSIS — G8929 Other chronic pain: Secondary | ICD-10-CM | POA: Insufficient documentation

## 2014-02-28 DIAGNOSIS — Z79899 Other long term (current) drug therapy: Secondary | ICD-10-CM | POA: Insufficient documentation

## 2014-02-28 DIAGNOSIS — I10 Essential (primary) hypertension: Secondary | ICD-10-CM | POA: Diagnosis not present

## 2014-02-28 DIAGNOSIS — M25551 Pain in right hip: Secondary | ICD-10-CM | POA: Diagnosis not present

## 2014-02-28 DIAGNOSIS — Z8719 Personal history of other diseases of the digestive system: Secondary | ICD-10-CM | POA: Diagnosis not present

## 2014-02-28 DIAGNOSIS — Z8669 Personal history of other diseases of the nervous system and sense organs: Secondary | ICD-10-CM | POA: Diagnosis not present

## 2014-02-28 DIAGNOSIS — R059 Cough, unspecified: Secondary | ICD-10-CM

## 2014-02-28 DIAGNOSIS — E119 Type 2 diabetes mellitus without complications: Secondary | ICD-10-CM | POA: Insufficient documentation

## 2014-02-28 DIAGNOSIS — R05 Cough: Secondary | ICD-10-CM | POA: Insufficient documentation

## 2014-02-28 DIAGNOSIS — Z8701 Personal history of pneumonia (recurrent): Secondary | ICD-10-CM | POA: Diagnosis not present

## 2014-02-28 MED ORDER — HYDROCOD POLST-CHLORPHEN POLST 10-8 MG/5ML PO LQCR: 5.0000 mL | Freq: Two times a day (BID) | ORAL | Status: DC | PRN

## 2014-02-28 NOTE — ED Notes (Signed)
Pt c/o cough and pain with cough x 3 days

## 2014-02-28 NOTE — Discharge Instructions (Signed)
As discussed, your evaluation today has been largely reassuring.  But, it is important that you monitor your condition carefully, and do not hesitate to return to the ED if you develop new, or concerning changes in your condition. ° °Otherwise, please follow-up with your physician for appropriate ongoing care. ° ° °Cough, Adult ° A cough is a reflex. It helps you clear your throat and airways. A cough can help heal your body. A cough can last 2 or 3 weeks (acute) or may last more than 8 weeks (chronic). Some common causes of a cough can include an infection, allergy, or a cold. °HOME CARE °· Only take medicine as told by your doctor. °· If given, take your medicines (antibiotics) as told. Finish them even if you start to feel better. °· Use a cold steam vaporizer or humidifier in your home. This can help loosen thick spit (secretions). °· Sleep so you are almost sitting up (semi-upright). Use pillows to do this. This helps reduce coughing. °· Rest as needed. °· Stop smoking if you smoke. °GET HELP RIGHT AWAY IF: °· You have yellowish-white fluid (pus) in your thick spit. °· Your cough gets worse. °· Your medicine does not reduce coughing, and you are losing sleep. °· You cough up blood. °· You have trouble breathing. °· Your pain gets worse and medicine does not help. °· You have a fever. °MAKE SURE YOU:  °· Understand these instructions. °· Will watch your condition. °· Will get help right away if you are not doing well or get worse. °Document Released: 10/26/2010 Document Revised: 06/29/2013 Document Reviewed: 10/26/2010 °ExitCare® Patient Information ©2015 [employer]. This information is not intended to replace advice given to you by your health care provider. Make sure you discuss any questions you have with your health care provider. ° °

## 2014-02-28 NOTE — ED Provider Notes (Signed)
CSN: [id number]     Arrival date & time 02/28/14  1252 History   First MD Initiated Contact with Patient 02/28/14 1614     Chief Complaint  Patient presents with  . Cough     HPI  Patient presents with one week of cough. Since onset cough has been persistent, with mild associated chest tightness while coughing. No other chest pain. No improvement with DayQuil. No concurrent fever, chills, nausea, vomiting, syncope. No new weight loss, weight gain, lower extremity swelling. Patient has not seen his physician in the past week. Patient has additional complaints of chronic pain in his right hip, right ankle, but no change, no new fall, no new loss of function or sensation in the extremity.  Past Medical History  Diagnosis Date  . Diabetes mellitus   . Chronic pain   . Hypertension   . Hyperlipidemia   . Sleep apnea   . Shortness of breath   . Inguinal hernia     right  . Pneumonia   . Numbness     T/O   Past Surgical History  Procedure Laterality Date  . Colon surgery    . Cataract extraction w/ intraocular lens implant Left 06/2012    laser for posterior capsule?  Marland Kitchen Tonsillectomy    . Posterior cervical fusion/foraminotomy N/A 11/12/2013    Procedure: Posterior cervical decompression fusion, cervical 3-4, cervical 4-5, cervical 5-6, cervical 6-7 with instrumentation and allograft   (LEVEL 4);  Surgeon: Sinclair Ship, MD;  Location: [facility];  Service: Orthopedics;  Laterality: N/A;  Posterior cervical decompression fusion, cervical 3-4, cervical 4-5, cervical 5-6, cervical 6-7 with instrumentation and allograft   Family History  Problem Relation Age of Onset  . Cancer - Other Other   . Diabetes Other   . Hypertension Other    History  Substance Use Topics  . Smoking status: Never Smoker   . Smokeless tobacco: Never Used  . Alcohol Use: Yes     Comment: rare alcohol /beer    Review of Systems  Constitutional:       Per HPI, otherwise negative  HENT:       Per  HPI, otherwise negative  Respiratory:       Per HPI, otherwise negative  Cardiovascular:       Per HPI, otherwise negative  Gastrointestinal: Negative for vomiting.  Endocrine:       Negative aside from HPI  Genitourinary:       Neg aside from HPI   Musculoskeletal:       Per HPI, otherwise negative  Skin: Negative.   Neurological: Negative for syncope.      Allergies  Tramadol  Home Medications   Prior to Admission medications   Medication Sig Start Date End Date Taking? Authorizing Provider  atorvastatin (LIPITOR) 20 MG tablet Take 1 tablet (20 mg total) by mouth daily. 04/08/13   Kandis Nab, MD  benzonatate (TESSALON PERLES) 100 MG capsule Take 1 capsule (100 mg total) by mouth 3 (three) times daily as needed for cough. 09/17/13   Timmothy Euler, MD  Blood Glucose Monitoring Suppl (ONE TOUCH ULTRA 2) W/DEVICE KIT Please dispense one unit. Patient to check blood sugar once every morning before breakfast. 02/26/14   Cordelia Poche, MD  Cholecalciferol (VITAMIN D3) 400 UNITS tablet Take 2 tablets (800 Units total) by mouth daily. 03/10/12   Vivi Ferns, MD  clonazePAM (KLONOPIN) 0.5 MG tablet Take 0.5 mg by mouth 2 (two) times daily as  needed for anxiety.     Historical Provider, MD  dextromethorphan-guaiFENesin (MUCINEX DM) 30-600 MG per 12 hr tablet Take 1 tablet by mouth 2 (two) times daily. 09/22/13   Bernadene Bell, MD  Elastic Bandages & Supports (LUMBAR BACK BRACE/SUPPORT PAD) MISC 1 Device by Does not apply route daily. 10/28/12   Kandis Nab, MD  FLUoxetine (PROZAC) 20 MG capsule Take 1 capsule (20 mg total) by mouth daily. 11/09/13   Cordelia Poche, MD  gabapentin (NEURONTIN) 100 MG capsule Take 1 capsule (100 mg total) by mouth 3 (three) times daily. 07/13/13   Lupita Dawn, MD  glimepiride (AMARYL) 4 MG tablet Take 1 tablet (4 mg total) by mouth daily. 04/08/13   Kandis Nab, MD  glucose blood (ONE TOUCH ULTRA TEST) test strip Check sugar 6 x daily 02/26/14   Cordelia Poche, MD  HYDROcodone-acetaminophen (NORCO) 7.5-325 MG per tablet Take 1 tablet by mouth every 8 (eight) hours as needed for moderate pain. 02/05/14   Cordelia Poche, MD  ipratropium (ATROVENT) 0.06 % nasal spray Place 2 sprays into both nostrils 4 (four) times daily. 04/30/13   Kandis Nab, MD  ketoconazole (NIZORAL) 2 % cream Apply 1 application topically daily.    Historical Provider, MD  Lancets [facility] ULTRASOFT) lancets Use as instructed 02/26/14   Cordelia Poche, MD  lisinopril (PRINIVIL,ZESTRIL) 10 MG tablet Take 1 tablet (10 mg total) by mouth daily. 08/24/13   Kandis Nab, MD  metFORMIN (GLUCOPHAGE) 1000 MG tablet Take 1 tablet (1,000 mg total) by mouth 2 (two) times daily with a meal. 08/24/13   Kandis Nab, MD  Misc. Devices (CANE) MISC 1 Device by Does not apply route daily. Please fit one point cane for balance and support 10/28/12   Kandis Nab, MD  omeprazole (PRILOSEC) 20 MG capsule Take 1 capsule (20 mg total) by mouth daily. 08/24/13   Kandis Nab, MD  pantoprazole (PROTONIX) 40 MG tablet Take 40 mg by mouth daily.    Historical Provider, MD  potassium chloride (KLOR-CON) 20 MEQ packet Take 20 mEq by mouth 3 (three) times daily.    Historical Provider, MD  rasagiline (AZILECT) 0.5 MG TABS tablet Take 1 tablet (0.5 mg total) by mouth daily. 02/03/14   Melvenia Beam, MD  senna (SENOKOT) 8.6 MG TABS tablet Take 2 tablets (17.2 mg total) by mouth daily. 02/05/14   Cordelia Poche, MD  tamsulosin (FLOMAX) 0.4 MG CAPS capsule Take 2 capsules (0.8 mg total) by mouth daily. 01/30/13   Melony Overly, MD  terbinafine (LAMISIL) 250 MG tablet Take 1 tablet (250 mg total) by mouth daily. 04/09/13   Kandis Nab, MD   BP 133/63 mmHg  Pulse 81  Temp(Src) 98.2 F (36.8 C) (Oral)  Resp 18  SpO2 98% Physical Exam  Constitutional: He is oriented to person, place, and time. He appears well-developed. No distress.  Deconditioned elderly male wearing a soft cervical collar, but  resting comfortably in bed  HENT:  Head: Normocephalic and atraumatic.  Eyes: Conjunctivae and EOM are normal.  Neck:  Patient acknowledges chronic soreness.  No new changes / complaints.  Cardiovascular: Normal rate and regular rhythm.   Pulmonary/Chest: Effort normal. No stridor. No respiratory distress. He has no wheezes.  Abdominal: He exhibits no distension. There is no tenderness.  Musculoskeletal: He exhibits no edema.  Mild tenderness to palpation about the right hip and right ankle, laterally and medially, respectively. No deformity.  Flexes  and extends both the hip and the ankle spontaneously.   Neurological: He is alert and oriented to person, place, and time.  Skin: Skin is warm and dry.  Psychiatric: He has a normal mood and affect.  Nursing note and vitals reviewed.   ED Course  Procedures (including critical care time) I reviewed the EMR  Imaging Review Dg Chest 2 View (if Patient Has Fever And/or Copd)  02/28/2014   CLINICAL DATA:  Cough, chest congestion, and shortness of breath.  EXAM: CHEST  2 VIEW  COMPARISON:  09/16/2013  FINDINGS: Heart size and pulmonary vascularity are normal. No infiltrates or effusions. Slight scarring in the lingula, stable. No acute osseous abnormality.  IMPRESSION: No acute abnormalities.   Electronically Signed   By: Rozetta Nunnery M.D.   On: 02/28/2014 14:57     MDM  Elderly male with multiple medical issues now presents with one week of cough. No fever, chills, hemodynamically instability, nor any concurrent complaints of weight gain, or swelling suggesting heart failure exacerbation. No evidence for pneumonia. Patient has no chest pain that is not associated with coughing. There is no evidence for coronary disease. Patient was started on a new antitussive regimen, will follow-up with primary care.      Carmin Muskrat, MD 02/28/14 [phone]

## 2014-03-03 ENCOUNTER — Ambulatory Visit (INDEPENDENT_AMBULATORY_CARE_PROVIDER_SITE_OTHER): Payer: Medicare Other | Admitting: Family Medicine

## 2014-03-03 ENCOUNTER — Encounter: Payer: Self-pay | Admitting: Family Medicine

## 2014-03-03 VITALS — BP 157/77 | HR 79 | Temp 97.7°F | Ht 69.0 in | Wt 192.6 lb

## 2014-03-03 DIAGNOSIS — E1140 Type 2 diabetes mellitus with diabetic neuropathy, unspecified: Secondary | ICD-10-CM

## 2014-03-03 DIAGNOSIS — Z1211 Encounter for screening for malignant neoplasm of colon: Secondary | ICD-10-CM

## 2014-03-03 DIAGNOSIS — M47812 Spondylosis without myelopathy or radiculopathy, cervical region: Secondary | ICD-10-CM

## 2014-03-03 DIAGNOSIS — E1149 Type 2 diabetes mellitus with other diabetic neurological complication: Secondary | ICD-10-CM

## 2014-03-03 DIAGNOSIS — G8929 Other chronic pain: Secondary | ICD-10-CM

## 2014-03-03 LAB — POCT GLYCOSYLATED HEMOGLOBIN (HGB A1C): Hemoglobin A1C: 5.3

## 2014-03-03 MED ORDER — LISINOPRIL 20 MG PO TABS: 20.0000 mg | ORAL_TABLET | Freq: Every day | ORAL | Status: DC

## 2014-03-03 MED ORDER — HYDROCODONE-ACETAMINOPHEN 7.5-325 MG PO TABS: 1.0000 | ORAL_TABLET | Freq: Three times a day (TID) | ORAL | Status: DC | PRN

## 2014-03-03 MED ORDER — GABAPENTIN 100 MG PO CAPS: 200.0000 mg | ORAL_CAPSULE | Freq: Three times a day (TID) | ORAL | Status: DC

## 2014-03-03 NOTE — Patient Instructions (Signed)
Thank you for coming to see me today. It was a pleasure. Today we talked about:   High blood pressure: I will INCREASE your lisinopril. Please start taking Lisinopril 20mg  daily. I will check lab work at your next visit to monitor your electrolytes.  Right leg pain: this sounds like Sciatica. I will INCREASE your gabapentin. Please start taking Gabapentin 200mg  three times per day. I will reassess you at your next visit  Screening for colon cancer: I have placed a referral for you to see a gastroenterologist.  No pulses: I could not feel pulses in your feet. I will send you to see Dr. Valentina Lucks to have your arteries assessed.  Please make an appointment to see me in 2 months for follow-up. Please call for refills your Norco.  If you have any questions or concerns, please do not hesitate to call the office at [phone].  Sincerely,  Cordelia Poche, MD

## 2014-03-03 NOTE — Progress Notes (Signed)
    Subjective    Tyler Lester is a 69 y.o. male that presents for an office visit.   Right leg numbness/pain: Symptoms started about 1 month ago. Sharp pain that radiates from back down to heel of his right leg. Symptoms have been getting worse. He has not tried anything to make it better although the Norco is helping. No sensational loss. Has no weakness, but states he has to get off of his leg due to pain at times. No recent trauma. Symptoms are present whether standing or walking, although walking does cause more pain. He states he has never smoked. No leg swelling.  Chronic pain: Patient has chronic pain that is controlled on Vicodin/Norco. Patient's function has declined because of the above problem.  Diabetes mellitus: patient still has not picked up glucose monitor. Reminded him that his meter should be available at the pharmacy. No issues with medication adherence per patient.   History  Substance Use Topics  . Smoking status: Never Smoker   . Smokeless tobacco: Never Used  . Alcohol Use: Yes     Comment: rare alcohol /beer    Allergies  Allergen Reactions  . Tramadol Itching    Meds ordered this encounter  Medications  . lisinopril (PRINIVIL,ZESTRIL) 20 MG tablet    Sig: Take 1 tablet (20 mg total) by mouth daily.    Dispense:  30 tablet    Refill:  2  . gabapentin (NEURONTIN) 100 MG capsule    Sig: Take 2 capsules (200 mg total) by mouth 3 (three) times daily.    Dispense:  180 capsule    Refill:  2  . HYDROcodone-acetaminophen (NORCO) 7.5-325 MG per tablet    Sig: Take 1 tablet by mouth every 8 (eight) hours as needed for moderate pain.    Dispense:  90 tablet    Refill:  0    ROS  Per HPI   Objective   BP 157/77 mmHg  Pulse 79  Temp(Src) 97.7 F (36.5 C) (Oral)  Ht 5\' 9"  (1.753 m)  Wt 192 lb 9.6 oz (87.363 kg)  BMI 28.43 kg/m2  General: Fair appearing male, no distress Cardiovascular: could not feel distal pedal or posterior tibialis pulses  bilaterally Musculoskeletal: Positive straight leg of right leg. No pain with internal/external rotation bilaterally Neuro: Resting tremor of left arm. 2+ patellar reflexes bilaterally, 4/5 strength bilaterally Skin: extremely dry  Assessment and Plan   Please refer to problem based charting of assessment and plan

## 2014-03-03 NOTE — Progress Notes (Signed)
Patient dropped DMV Form for Disability Placard to be signed by PCP. Please, follow up with Patient.

## 2014-03-03 NOTE — Progress Notes (Signed)
Spoke with patient and informed him that form is up front for pick up

## 2014-03-08 ENCOUNTER — Ambulatory Visit: Payer: Medicaid Other | Admitting: Pharmacist

## 2014-03-10 ENCOUNTER — Ambulatory Visit (INDEPENDENT_AMBULATORY_CARE_PROVIDER_SITE_OTHER): Payer: Medicare Other

## 2014-03-10 ENCOUNTER — Ambulatory Visit (INDEPENDENT_AMBULATORY_CARE_PROVIDER_SITE_OTHER): Payer: Medicare Other | Admitting: Podiatry

## 2014-03-10 ENCOUNTER — Telehealth: Payer: Self-pay | Admitting: Family Medicine

## 2014-03-10 ENCOUNTER — Encounter: Payer: Self-pay | Admitting: Podiatry

## 2014-03-10 DIAGNOSIS — M25579 Pain in unspecified ankle and joints of unspecified foot: Secondary | ICD-10-CM

## 2014-03-10 DIAGNOSIS — E1140 Type 2 diabetes mellitus with diabetic neuropathy, unspecified: Secondary | ICD-10-CM

## 2014-03-10 DIAGNOSIS — G20 Parkinson's disease: Secondary | ICD-10-CM

## 2014-03-10 DIAGNOSIS — I739 Peripheral vascular disease, unspecified: Secondary | ICD-10-CM

## 2014-03-10 DIAGNOSIS — G629 Polyneuropathy, unspecified: Secondary | ICD-10-CM

## 2014-03-10 DIAGNOSIS — E1149 Type 2 diabetes mellitus with other diabetic neurological complication: Secondary | ICD-10-CM

## 2014-03-10 DIAGNOSIS — B351 Tinea unguium: Secondary | ICD-10-CM

## 2014-03-10 DIAGNOSIS — R52 Pain, unspecified: Secondary | ICD-10-CM

## 2014-03-10 NOTE — Telephone Encounter (Signed)
Need referral to [facility]

## 2014-03-10 NOTE — Progress Notes (Signed)
   Subjective:    Patient ID: Tyler Lester, male    DOB: 04/23/1945, 69 y.o.   MRN: [id number]  HPI  69 year old male presents the office they with complaints of bilateral ankle pain with a right greater than left. He states that he has pain radiating from his back down to his right ankle which has been ongoing for the last year has been getting worse. He has been on Norco which seems to help. He is also been seen by his primary care physician for this as well. Denies any history of injury or trauma to bilateral lower extremities. The only injury he recalls is back in high school. He also states his nails are painful and elongated as they are rubbing on shoes. Denies any recent redness or drainage from the nail sites. No other complaints at this time.     Review of Systems  Cardiovascular:       CALF PAIN WITH WALKING  Musculoskeletal: Positive for back pain.       JOINT AND MUSCLE PAIN  Neurological: Positive for weakness.  Hematological:       SLOW TO HEAL  Psychiatric/Behavioral: The patient is nervous/anxious.   All other systems reviewed and are negative.      Objective:   Physical Exam AAO x3, NAD DP/PT pulses decreased b/l. There does appear to be atrophy of the skin. Decreased protective sensation with Derrel Nip monofilament, vibratory sensation intact, Achilles tendon reflex intact. There is diffuse tenderness overlying the right ankle/leg however there is no specific area pinpoint bony tenderness or pain with vibratory sensation. There is mild discomfort with ankle joint range of motion. No pain with STJ ROM. There is no signficiant increase in edema compared to contralateral extremity. There is no overlying erythema or increase in warmth. Nails hypertrophic, dystrophic, elongated, brittle, discolored 10. No swelling erythema, drainage from around the nail sites. No open lesions or pre-ulcerative lesions identified.  There is is no pain with calf compression,  swelling, warmth, erythema.       Assessment and plan   69 year old male with symptomatic onychomycosis; bilateral ankle pain right greater than left  -X-rays were obtained and reviewed with the patient. -Treatment options were discussed including alternatives, risks, complications. -Nail sharply debrided 10 without complications/bleeding. -According to patient's primary care physician's last note he has an appointment to see Dr. Valentina Lucks to assess circulation.  -Ankle pain seems to also originate from low back. Follow-up with PCP/neurologist - Follow-up in 3 months or sooner should any problems arise. In the meantime, encouraged to call the office with any questions, concerns, change in symptoms.

## 2014-03-11 ENCOUNTER — Telehealth: Payer: Self-pay | Admitting: Family Medicine

## 2014-03-11 NOTE — Telephone Encounter (Signed)
Pt called because the cough syrup   That was called in is over 100.00 and he can not afford this. Can we call in something that would be cheaper. jw

## 2014-03-12 ENCOUNTER — Ambulatory Visit: Payer: Medicaid Other | Admitting: Pharmacist

## 2014-03-12 NOTE — Telephone Encounter (Signed)
patient coming in today 03/12/2014

## 2014-03-12 NOTE — Telephone Encounter (Signed)
I have not every evaluated him for a cough nor have I prescribed him medication for a cough, that I can remember. I see he has gotten a prescription from the ED for cough syrup. I recommend he make an appointment to be seen if his symptoms warrant.

## 2014-03-12 NOTE — Telephone Encounter (Signed)
LVM for pt to return call to inform him of below. Katharina Caper, Kezia Benevides D

## 2014-03-19 ENCOUNTER — Ambulatory Visit: Payer: Medicare Other | Admitting: Neurology

## 2014-03-22 ENCOUNTER — Encounter: Payer: Self-pay | Admitting: Neurology

## 2014-03-22 ENCOUNTER — Ambulatory Visit (INDEPENDENT_AMBULATORY_CARE_PROVIDER_SITE_OTHER): Payer: Medicare Other | Admitting: Neurology

## 2014-03-22 VITALS — BP 138/85 | HR 84 | Ht 69.0 in | Wt 195.0 lb

## 2014-03-22 DIAGNOSIS — G20 Parkinson's disease: Secondary | ICD-10-CM

## 2014-03-22 MED ORDER — RASAGILINE MESYLATE 1 MG PO TABS: 1.0000 mg | ORAL_TABLET | Freq: Every day | ORAL | Status: DC

## 2014-03-22 MED ORDER — GABAPENTIN 300 MG PO CAPS: 300.0000 mg | ORAL_CAPSULE | Freq: Three times a day (TID) | ORAL | Status: DC

## 2014-03-22 NOTE — Progress Notes (Signed)
[facility]    Provider:  Dr Jaynee Eagles Referring Provider: Cordelia Poche, MD Primary Care Physician:  Cordelia Poche, MD  CC: Tremor  HPI:  Tyler Lester is a 69 y.o. male here as a follow up for tremor.   Interval History 03/22/2014: the tremor continues. He appears to be a poor historian and doesn't quite know what medications he is taking. He is not having any side effects from the Azilect and says he believes he is taking it. He also has neck pain and low back pain. He is on pain medication and is waiting to be seen by pain management. He had recent cervical spine surgery and has had lumber spine surgery in the past. He is having shooting pain from low back into right leg that is chronic and he is being managed by orthopaedic doctor. The orthopedist cancelled his pain medication because he was already getting vicodin from dr. Teryl Lucy and patient wants to know if I can give him the percocet instead of vicodin. He is only taking 271m three times daily (at first he said just once daily) of neurontin. No side effects from the gabapentin.   02/03/2014: Tyler Lester a 69y.o. male here as a referral from Dr. NLonny Prudefor Tremor. PMHx cervical fusion (September), chronic back pain, DM, HTN, fatigue, depression, anxiety, myelopathy.   Was sent here by Dr. NTeryl Lucyfor evaluation of tremor. He has a tremor in his left hand and his jaw. Tremor has only been going on for at least a year but worse in the last 3 months. Getting worse after neck surgery. Worse in the left but also in the right hand as well as the chin. He can't walk, he has chronic pain in his low back and neck. Walking slow, doesn't have energy. No tremors in other family members. Doesn't notice the tremor getting better with alcohol. No constipation. Can hardly smell. Sometimes he is talking low, softer. Hand writing is shaky, smaller. Feels weak. TSH was normal in June. Does not drink caffeine. 1-2 drinks a week, no  significant alcohol. No FHx of parkinson's or neurodegenerative disease. Has had falls.   Review of Systems: Patient complains of symptoms per HPI as well as the following symptoms: leg swelling, joint swelling, back pain, walking difficulty, neck pain, depression, nervous/anxious. Pertinent negatives per HPI. All others negative.   History   Social History  . Marital Status: Single    Spouse Name: N/A    Number of Children: 2  . Years of Education: 12   Occupational History  . Retired    . Disability    Social History Main Topics  . Smoking status: Never Smoker   . Smokeless tobacco: Never Used  . Alcohol Use: Yes     Comment: rare alcohol /beer  . Drug Use: No  . Sexual Activity: Not on file   Other Topics Concern  . Not on file   Social History Narrative   Patient lives at home alone.    Patient have 2 children.    Patient has a high school education    Patient is right handed.    Patient is retired on disability.        Family History  Problem Relation Age of Onset  . Cancer - Other Other   . Diabetes Other   . Hypertension Other     Past Medical History  Diagnosis Date  . Diabetes mellitus   . Chronic pain   . Hypertension   .  Hyperlipidemia   . Sleep apnea   . Shortness of breath   . Inguinal hernia     right  . Pneumonia   . Numbness     T/O    Past Surgical History  Procedure Laterality Date  . Colon surgery    . Cataract extraction w/ intraocular lens implant Left 06/2012    laser for posterior capsule?  Marland Kitchen Tonsillectomy    . Posterior cervical fusion/foraminotomy N/A 11/12/2013    Procedure: Posterior cervical decompression fusion, cervical 3-4, cervical 4-5, cervical 5-6, cervical 6-7 with instrumentation and allograft   (LEVEL 4);  Surgeon: Sinclair Ship, MD;  Location: [facility];  Service: Orthopedics;  Laterality: N/A;  Posterior cervical decompression fusion, cervical 3-4, cervical 4-5, cervical 5-6, cervical 6-7 with instrumentation  and allograft    Current Outpatient Prescriptions  Medication Sig Dispense Refill  . atorvastatin (LIPITOR) 20 MG tablet Take 1 tablet (20 mg total) by mouth daily. 90 tablet 3  . Blood Glucose Monitoring Suppl (ONE TOUCH ULTRA 2) W/DEVICE KIT Please dispense one unit. Patient to check blood sugar once every morning before breakfast. 1 each 0  . chlorpheniramine-HYDROcodone (TUSSIONEX PENNKINETIC ER) 10-8 MG/5ML LQCR Take 5 mLs by mouth every 12 (twelve) hours as needed for cough. 115 mL 0  . Cholecalciferol (VITAMIN D3) 400 UNITS tablet Take 2 tablets (800 Units total) by mouth daily. 60 tablet 1  . clonazePAM (KLONOPIN) 0.5 MG tablet Take 0.5 mg by mouth 2 (two) times daily as needed for anxiety.     Water engineer Bandages & Supports (LUMBAR BACK BRACE/SUPPORT PAD) MISC 1 Device by Does not apply route daily. 1 each 0  . FLUoxetine (PROZAC) 20 MG capsule Take 1 capsule (20 mg total) by mouth daily. 30 capsule 2  . FLUZONE HIGH-DOSE 0.5 ML SUSY   0  . gabapentin (NEURONTIN) 100 MG capsule Take 2 capsules (200 mg total) by mouth 3 (three) times daily. 180 capsule 2  . glimepiride (AMARYL) 4 MG tablet Take 1 tablet (4 mg total) by mouth daily. 90 tablet 6  . glucose blood (ONE TOUCH ULTRA TEST) test strip Check sugar 6 x daily 200 each 3  . HYDROcodone-acetaminophen (NORCO) 7.5-325 MG per tablet Take 1 tablet by mouth every 8 (eight) hours as needed for moderate pain. 90 tablet 0  . ipratropium (ATROVENT) 0.06 % nasal spray Place 2 sprays into both nostrils 4 (four) times daily. 15 mL 12  . ketoconazole (NIZORAL) 2 % cream Apply 1 application topically daily.    . Lancets (ONETOUCH ULTRASOFT) lancets Use as instructed 100 each 12  . lisinopril (PRINIVIL,ZESTRIL) 20 MG tablet Take 1 tablet (20 mg total) by mouth daily. 30 tablet 2  . metFORMIN (GLUCOPHAGE) 1000 MG tablet Take 1 tablet (1,000 mg total) by mouth 2 (two) times daily with a meal. 180 tablet 6  . Misc. Devices (CANE) MISC 1 Device by Does  not apply route daily. Please fit one point cane for balance and support 1 each 0  . omeprazole (PRILOSEC) 20 MG capsule Take 1 capsule (20 mg total) by mouth daily. 30 capsule 3  . pantoprazole (PROTONIX) 40 MG tablet Take 40 mg by mouth daily.    . potassium chloride (KLOR-CON) 20 MEQ packet Take 20 mEq by mouth 3 (three) times daily.    Marland Kitchen PREVNAR 13 SUSP injection   0  . rasagiline (AZILECT) 0.5 MG TABS tablet Take 1 tablet (0.5 mg total) by mouth daily. 30 tablet 3  .  senna (SENOKOT) 8.6 MG TABS tablet Take 2 tablets (17.2 mg total) by mouth daily. 120 each 5  . tamsulosin (FLOMAX) 0.4 MG CAPS capsule Take 2 capsules (0.8 mg total) by mouth daily. 60 capsule 1  . terbinafine (LAMISIL) 250 MG tablet Take 1 tablet (250 mg total) by mouth daily. 45 tablet 0   No current facility-administered medications for this visit.    Allergies as of 03/22/2014 - Review Complete 03/22/2014  Allergen Reaction Noted  . Tramadol Itching 12/30/2013    Vitals: BP 138/85 mmHg  Pulse 84  Ht 5' 9"  (1.753 m)  Wt 195 lb (88.451 kg)  BMI 28.78 kg/m2 Last Weight:  Wt Readings from Last 1 Encounters:  03/22/14 195 lb (88.451 kg)   Last Height:   Ht Readings from Last 1 Encounters:  03/22/14 5' 9"  (1.753 m)   Speech: Mildly hypophonic. No aphasia, not dysarthric Cognition:  The patient is oriented to person, place, and time;  Cranial Nerves:  The pupils are equal, round, and reactive to light. Visual fields are full to finger confrontation. Extraocular movements are intact. Trigeminal sensation is intact and the muscles of mastication are normal. The face is symmetric. The palate elevates in the midline. Voice is normal. Shoulder shrug is normal. The tongue has normal motion without fasciculations.   Coordination: Bradykinesias left > right finger tapping, slow but intact FTN, no dysmetria  Gait: antalgic. Difficult to assess for shuffling and arm swing because he has chronic pain, recent  neck surgery, chronic low back pain and uses a cane.   Motor Observation: Left>R resting tremor and chin tremor. Decreased blink. Postural tremor.    Tone:  No cogwheeling      Assessment/Plan: 69 year old male here for evaluation of tremor. PMHx cervical fusion (September), chronic back pain, DM, HTN, fatigue, depression, anxiety, myelopathy. Has some parkinsonian features, left > right resting tremor, bradykinesia left > right on finger tapping, mild hypophonia, masked facies. No cogwheeling. Antalgic gait with small strides however difficult to assess for shuffling or arm swing because he has chronic pain, recent neck surgery, chronic low back pain, neuropathy and uses a cane.   He is a poor historian and I am not entirely convinced he knows which medications he is taking. I have asked him to bring his medications with him next time. Can continue the Azilect and increase gabapentin to 354m three times daily for now and if he tolerates this can slowly increase to 6013mthree times daily if needed. Went through in detail with patient. Asked him to call me if he has any questions.   MRI of the brain  Will continue Azilect and follow clinically - Please avoid the following medications while using Azilect:dextromethorphan (can be found in cough medicines), Luvox (fluvoxamine), Prozac (fluoxetine), Flexeril (cyclobenzaprine), Darvon (propoxyphene), St. John's Wort, Tramadol, Methadone, and Demerol (meperidine).  If you need to take ciprofloxacin for an infection, cut down the Azilect to  tab prior to starting the cipro  AnSarina IllMD  [facility] [address]uTrin[place]av[place] [id number] [phone] [phone] total of 20 minutes was spent in with this patient. Over half this time was spent on counseling patient on the Parkinson's diagnosis and different diagnostic and therapeutic options available.

## 2014-03-22 NOTE — Patient Instructions (Addendum)
Overall you are doing fairly well but I do want to suggest a few things today:   Remember to drink plenty of fluid, eat healthy meals and do not skip any meals. Try to eat protein with a every meal and eat a healthy snack such as fruit or nuts in between meals. Try to keep a regular sleep-wake schedule and try to exercise daily, particularly in the form of walking, 20-30 minutes a day, if you can.   As far as your medications are concerned, I would like to suggest:   1. Increasing Neurontin to 300mg  three times daily. Can increase to 600mg  three times daily if needed and there are no side effects.   2. Increase Azilect to 1mg  daily (please stop taking the 0.5mg  pill daily). Please avoid the following medications while using Azilect: dextromethorphan (can be found in cough medicines), Luvox (fluvoxamine), Prozac (fluoxetine), Flexeril (cyclobenzaprine), Darvon (propoxyphene), St. John's Wort, Tramadol, Methadone, and Demerol (meperidine).  If you need to take ciprofloxacin for an infection, cut down the Azilect to  tab prior to starting the cipro (patient denies taking prozac anymore)  As far as diagnostic testing: Still need MRi of the brain  I would like to see you back in 6 months, sooner if we need to. Please call us with any interim questions, concerns, problems, updates or refill requests.   Please also call us for any test results so we can go over those with you on the phone.  My clinical assistant and will answer any of your questions and relay your messages to me and also relay most of my messages to you.   Our phone number is [phone]. We also have an after hours call service for urgent matters and there is a physician on-call for urgent questions. For any emergencies you know to call 911 or go to the nearest emergency room

## 2014-03-24 ENCOUNTER — Other Ambulatory Visit: Payer: Self-pay | Admitting: *Deleted

## 2014-03-25 ENCOUNTER — Emergency Department (HOSPITAL_COMMUNITY): Payer: Medicare Other

## 2014-03-25 ENCOUNTER — Emergency Department (HOSPITAL_COMMUNITY)
Admission: EM | Admit: 2014-03-25 | Discharge: 2014-03-25 | Disposition: A | Discharge status: Home / Self Care | Payer: Medicare Other | Attending: Emergency Medicine | Admitting: Emergency Medicine

## 2014-03-25 ENCOUNTER — Encounter (HOSPITAL_COMMUNITY): Payer: Self-pay

## 2014-03-25 DIAGNOSIS — I10 Essential (primary) hypertension: Secondary | ICD-10-CM | POA: Insufficient documentation

## 2014-03-25 DIAGNOSIS — R339 Retention of urine, unspecified: Secondary | ICD-10-CM | POA: Diagnosis present

## 2014-03-25 DIAGNOSIS — Z8701 Personal history of pneumonia (recurrent): Secondary | ICD-10-CM | POA: Insufficient documentation

## 2014-03-25 DIAGNOSIS — N401 Enlarged prostate with lower urinary tract symptoms: Secondary | ICD-10-CM

## 2014-03-25 DIAGNOSIS — G8929 Other chronic pain: Secondary | ICD-10-CM | POA: Diagnosis not present

## 2014-03-25 DIAGNOSIS — R338 Other retention of urine: Secondary | ICD-10-CM

## 2014-03-25 DIAGNOSIS — N400 Enlarged prostate without lower urinary tract symptoms: Secondary | ICD-10-CM | POA: Diagnosis not present

## 2014-03-25 DIAGNOSIS — Z8719 Personal history of other diseases of the digestive system: Secondary | ICD-10-CM | POA: Insufficient documentation

## 2014-03-25 DIAGNOSIS — Z79899 Other long term (current) drug therapy: Secondary | ICD-10-CM | POA: Diagnosis not present

## 2014-03-25 DIAGNOSIS — E119 Type 2 diabetes mellitus without complications: Secondary | ICD-10-CM | POA: Diagnosis not present

## 2014-03-25 LAB — COMPREHENSIVE METABOLIC PANEL
ALT: 11 U/L (ref 0–53)
AST: 22 U/L (ref 0–37)
Albumin: 3.7 g/dL (ref 3.5–5.2)
Alkaline Phosphatase: 54 U/L (ref 39–117)
Anion gap: 9 (ref 5–15)
BUN: 8 mg/dL (ref 6–23)
CO2: 32 mmol/L (ref 19–32)
Calcium: 9.3 mg/dL (ref 8.4–10.5)
Chloride: 103 mmol/L (ref 96–112)
Creatinine, Ser: 1.11 mg/dL (ref 0.50–1.35)
GFR calc Af Amer: 78 mL/min — ABNORMAL LOW (ref >90)
GFR calc non Af Amer: 67 mL/min — ABNORMAL LOW (ref >90)
Glucose, Bld: 107 mg/dL — ABNORMAL HIGH (ref 70–99)
Potassium: 3 mmol/L — ABNORMAL LOW (ref 3.5–5.1)
Sodium: 144 mmol/L (ref 135–145)
Total Bilirubin: 0.5 mg/dL (ref 0.3–1.2)
Total Protein: 7.4 g/dL (ref 6.0–8.3)

## 2014-03-25 LAB — PROTIME-INR
INR: 0.99 (ref 0.00–1.49)
Prothrombin Time: 13.2 seconds (ref 11.6–15.2)

## 2014-03-25 LAB — CBC WITH DIFFERENTIAL/PLATELET
Basophils Absolute: 0 10*3/uL (ref 0.0–0.1)
Basophils Relative: 0 % (ref 0–1)
Eosinophils Absolute: 0 10*3/uL (ref 0.0–0.7)
Eosinophils Relative: 0 % (ref 0–5)
HCT: 48.6 % (ref 39.0–52.0)
Hemoglobin: 16.5 g/dL (ref 13.0–17.0)
Lymphocytes Relative: 17 % (ref 12–46)
Lymphs Abs: 1 10*3/uL (ref 0.7–4.0)
MCH: 29.4 pg (ref 26.0–34.0)
MCHC: 34 g/dL (ref 30.0–36.0)
MCV: 86.5 fL (ref 78.0–100.0)
Monocytes Absolute: 0.3 10*3/uL (ref 0.1–1.0)
Monocytes Relative: 5 % (ref 3–12)
Neutro Abs: 4.9 10*3/uL (ref 1.7–7.7)
Neutrophils Relative %: 78 % — ABNORMAL HIGH (ref 43–77)
Platelets: 190 10*3/uL (ref 150–400)
RBC: 5.62 MIL/uL (ref 4.22–5.81)
RDW: 13.2 % (ref 11.5–15.5)
WBC: 6.2 10*3/uL (ref 4.0–10.5)

## 2014-03-25 LAB — URINALYSIS, ROUTINE W REFLEX MICROSCOPIC
Bilirubin Urine: NEGATIVE
Glucose, UA: NEGATIVE mg/dL
Ketones, ur: NEGATIVE mg/dL
Leukocytes, UA: NEGATIVE
Nitrite: NEGATIVE
Protein, ur: 100 mg/dL — AB
Specific Gravity, Urine: 1.009 (ref 1.005–1.030)
Urobilinogen, UA: 1 mg/dL (ref 0.0–1.0)
pH: 7 (ref 5.0–8.0)

## 2014-03-25 LAB — URINE MICROSCOPIC-ADD ON

## 2014-03-25 MED ORDER — TAMSULOSIN HCL 0.4 MG PO CAPS: 0.4000 mg | ORAL_CAPSULE | Freq: Every day | ORAL | Status: DC

## 2014-03-25 NOTE — Discharge Instructions (Signed)
Acute Urinary Retention °Acute urinary retention is the temporary inability to urinate. °This is a common problem in older men. As men age their prostates become larger and block the flow of urine from the bladder. This is usually a problem that has come on gradually.  °HOME CARE INSTRUCTIONS °If you are sent home with a Foley catheter and a drainage system, you will need to discuss the best course of action with your health care provider. While the catheter is in, maintain a good intake of fluids. Keep the drainage bag emptied and lower than your catheter. This is so that contaminated urine will not flow back into your bladder, which could lead to a urinary tract infection. °There are two main types of drainage bags. One is a large bag that usually is used at night. It has a good capacity that will allow you to sleep through the night without having to empty it. The second type is called a leg bag. It has a smaller capacity, so it needs to be emptied more frequently. However, the main advantage is that it can be attached by a leg strap and can go underneath your clothing, allowing you the freedom to move about or leave your home. °Only take over-the-counter or prescription medicines for pain, discomfort, or fever as directed by your health care provider.  °SEEK MEDICAL CARE IF: °· You develop a low-grade fever. °· You experience spasms or leakage of urine with the spasms. °SEEK IMMEDIATE MEDICAL CARE IF:  °· You develop chills or fever. °· Your catheter stops draining urine. °· Your catheter falls out. °· You start to develop increased bleeding that does not respond to rest and increased fluid intake. °MAKE SURE YOU: °· Understand these instructions. °· Will watch your condition. °· Will get help right away if you are not doing well or get worse. °Document Released: 05/21/2000 Document Revised: 02/17/2013 Document Reviewed: 07/24/2012 °Exit[employer]® Patient Information ©2015 [employer]. This information is not  intended to replace advice given to you by your health care provider. Make sure you discuss any questions you have with your health care provider. ° °

## 2014-03-25 NOTE — ED Notes (Signed)
Phlebotomy at the bedside  

## 2014-03-25 NOTE — ED Notes (Signed)
Patient returned from CT

## 2014-03-25 NOTE — ED Notes (Signed)
Pt from home with urinary retention that started this am.  Sts he was able to empty bladder earlier today but now has only been able to go a little at a time.  Reporting pressure. Hx of parkinsons and urinary retention.

## 2014-03-25 NOTE — ED Notes (Signed)
MD made aware of the bladder scan. Ordered In and Out instead of Foley.

## 2014-03-25 NOTE — ED Notes (Signed)
Patient given [place] sandwich per MD approval.

## 2014-03-25 NOTE — ED Notes (Signed)
Patient being sent home with Leg bed. Patient aware of how to use and verbalizes understanding.

## 2014-03-25 NOTE — ED Provider Notes (Signed)
CSN: [id number]     Arrival date & time 03/25/14  1130 History   First MD Initiated Contact with Patient 03/25/14 1146     Chief Complaint  Patient presents with  . Urinary Retention     (Consider location/radiation/quality/duration/timing/severity/associated sxs/prior Treatment) HPI He reports this morning he got up and was able to only put out a few drops of urine. The patient reports he had a good bowel movement but with straining was not able to urinate any more than a small amount. He reports he's been developing pressure in the lower abdomen in the suprapubic region. He reports he's had this before and in the past he needed a Foley catheter placed. He reports to the pressures become very intense and painful. He denies any pain in his back. He denies any associated fever, nausea or vomiting. Past Medical History  Diagnosis Date  . Diabetes mellitus   . Chronic pain   . Hypertension   . Hyperlipidemia   . Sleep apnea   . Shortness of breath   . Inguinal hernia     right  . Pneumonia   . Numbness     T/O   Past Surgical History  Procedure Laterality Date  . Colon surgery    . Cataract extraction w/ intraocular lens implant Left 06/2012    laser for posterior capsule?  Marland Kitchen Tonsillectomy    . Posterior cervical fusion/foraminotomy N/A 11/12/2013    Procedure: Posterior cervical decompression fusion, cervical 3-4, cervical 4-5, cervical 5-6, cervical 6-7 with instrumentation and allograft   (LEVEL 4);  Surgeon: Sinclair Ship, MD;  Location: [facility];  Service: Orthopedics;  Laterality: N/A;  Posterior cervical decompression fusion, cervical 3-4, cervical 4-5, cervical 5-6, cervical 6-7 with instrumentation and allograft   Family History  Problem Relation Age of Onset  . Cancer - Other Other   . Diabetes Other   . Hypertension Other    History  Substance Use Topics  . Smoking status: Never Smoker   . Smokeless tobacco: Never Used  . Alcohol Use: Yes     Comment: rare  alcohol /beer    Review of Systems  10 Systems reviewed and are negative for acute change except as noted in the HPI.   Allergies  Tramadol  Home Medications   Prior to Admission medications   Medication Sig Start Date End Date Taking? Authorizing Provider  atorvastatin (LIPITOR) 20 MG tablet Take 1 tablet (20 mg total) by mouth daily. 04/08/13  Yes Kandis Nab, MD  Blood Glucose Monitoring Suppl (ONE TOUCH ULTRA 2) W/DEVICE KIT Please dispense one unit. Patient to check blood sugar once every morning before breakfast. 02/26/14  Yes Cordelia Poche, MD  clonazePAM (KLONOPIN) 0.5 MG tablet Take 0.5 mg by mouth 2 (two) times daily as needed for anxiety.    Yes Historical Provider, MD  Elastic Bandages & Supports (LUMBAR BACK BRACE/SUPPORT PAD) MISC 1 Device by Does not apply route daily. 10/28/12  Yes Kandis Nab, MD  FLUZONE HIGH-DOSE 0.5 ML SUSY  01/05/14  Yes Historical Provider, MD  gabapentin (NEURONTIN) 300 MG capsule Take 1 capsule (300 mg total) by mouth 3 (three) times daily. May increase to 2 capsules (665m total) 3 times daily as needed. Patient taking differently: Take 300 mg by mouth 2 (two) times daily. May increase to 2 capsules (6042mtotal) 3 times daily as needed. 03/22/14  Yes AnMelvenia BeamMD  glimepiride (AMARYL) 4 MG tablet Take 1 tablet (4 mg total) by  mouth daily. 04/08/13  Yes Kandis Nab, MD  glucose blood (ONE TOUCH ULTRA TEST) test strip Check sugar 6 x daily 02/26/14  Yes Cordelia Poche, MD  HYDROcodone-acetaminophen (NORCO) 7.5-325 MG per tablet Take 1 tablet by mouth every 8 (eight) hours as needed for moderate pain. 03/03/14  Yes Cordelia Poche, MD  ketoconazole (NIZORAL) 2 % cream Apply 1 application topically daily.   Yes Historical Provider, MD  Lancets [facility] ULTRASOFT) lancets Use as instructed 02/26/14  Yes Cordelia Poche, MD  lisinopril (PRINIVIL,ZESTRIL) 20 MG tablet Take 1 tablet (20 mg total) by mouth daily. 03/03/14  Yes Cordelia Poche, MD  metFORMIN  (GLUCOPHAGE) 1000 MG tablet Take 1 tablet (1,000 mg total) by mouth 2 (two) times daily with a meal. 08/24/13  Yes Kandis Nab, MD  Misc. Devices (CANE) MISC 1 Device by Does not apply route daily. Please fit one point cane for balance and support 10/28/12  Yes Kandis Nab, MD  omeprazole (PRILOSEC) 20 MG capsule Take 1 capsule (20 mg total) by mouth daily. 08/24/13  Yes Kandis Nab, MD  pantoprazole (PROTONIX) 40 MG tablet Take 40 mg by mouth daily.   Yes Historical Provider, MD  potassium chloride (KLOR-CON) 20 MEQ packet Take 20 mEq by mouth 2 (two) times daily.    Yes Historical Provider, MD  PREVNAR 13 SUSP injection  01/05/14  Yes Historical Provider, MD  rasagiline (AZILECT) 1 MG TABS tablet Take 1 tablet (1 mg total) by mouth daily. 03/22/14  Yes Melvenia Beam, MD  senna (SENOKOT) 8.6 MG TABS tablet Take 2 tablets (17.2 mg total) by mouth daily. Patient taking differently: Take 1 tablet by mouth daily.  02/05/14  Yes Cordelia Poche, MD  chlorpheniramine-HYDROcodone [facility] PENNKINETIC ER) 10-8 MG/5ML LQCR Take 5 mLs by mouth every 12 (twelve) hours as needed for cough. Patient not taking: Reported on 03/25/2014 02/28/14   Carmin Muskrat, MD  Cholecalciferol (VITAMIN D3) 400 UNITS tablet Take 2 tablets (800 Units total) by mouth daily. Patient not taking: Reported on 03/25/2014 03/10/12   Vivi Ferns, MD  ipratropium (ATROVENT) 0.06 % nasal spray Place 2 sprays into both nostrils 4 (four) times daily. Patient not taking: Reported on 03/25/2014 04/30/13   Kandis Nab, MD  tamsulosin (FLOMAX) 0.4 MG CAPS capsule Take 1 capsule (0.4 mg total) by mouth daily after supper. 03/25/14   Charlesetta Shanks, MD  terbinafine (LAMISIL) 250 MG tablet Take 1 tablet (250 mg total) by mouth daily. Patient not taking: Reported on 03/25/2014 04/09/13   Kandis Nab, MD   BP 169/86 mmHg  Pulse 87  Temp(Src) 97.5 F (36.4 C) (Oral)  Resp 27  SpO2 92% Physical Exam  Constitutional: He is  oriented to person, place, and time. He appears well-developed and well-nourished.  Patient appears to be in moderate pain. His color is good he has no respiratory distress and his mental status is clear.  HENT:  Head: Normocephalic and atraumatic.  Eyes: EOM are normal. Pupils are equal, round, and reactive to light.  Neck: Neck supple.  Cardiovascular: Normal rate, regular rhythm, normal heart sounds and intact distal pulses.   Pulmonary/Chest: Effort normal and breath sounds normal.  Abdominal: Soft. Bowel sounds are normal. He exhibits no distension. There is tenderness.  Patient endorses significant tenderness to palpation in the suprapubic region. There is no guarding present. No penile or scrotal swelling.  Musculoskeletal: Normal range of motion. Edema: patient has symmetric 2+ pitting edema bilateral lower extremities. He  has associated skin thinning of chronic venous stasis.  Neurological: He is alert and oriented to person, place, and time. He has normal strength. Coordination normal. GCS eye subscore is 4. GCS verbal subscore is 5. GCS motor subscore is 6.  Skin: Skin is warm, dry and intact.  Psychiatric: He has a normal mood and affect.    ED Course  Procedures (including critical care time) Labs Review Labs Reviewed  URINALYSIS, ROUTINE W REFLEX MICROSCOPIC - Abnormal; Notable for the following:    Hgb urine dipstick SMALL (*)    Protein, ur 100 (*)    All other components within normal limits  URINE MICROSCOPIC-ADD ON - Abnormal; Notable for the following:    Squamous Epithelial / LPF FEW (*)    All other components within normal limits  COMPREHENSIVE METABOLIC PANEL - Abnormal; Notable for the following:    Potassium 3.0 (*)    Glucose, Bld 107 (*)    GFR calc non Af Amer 67 (*)    GFR calc Af Amer 78 (*)    All other components within normal limits  CBC WITH DIFFERENTIAL/PLATELET - Abnormal; Notable for the following:    Neutrophils Relative % 78 (*)    All other  components within normal limits  PROTIME-INR    Imaging Review Ct Renal Stone Study  03/25/2014   CLINICAL DATA:  Urinary retention for 1 day  EXAM: CT ABDOMEN AND PELVIS WITHOUT CONTRAST  TECHNIQUE: Multidetector CT imaging of the abdomen and pelvis was performed following the standard protocol without oral or intravenous contrast material administration.  COMPARISON:  None.  FINDINGS: Lung bases are clear.  No focal liver lesions are identified on this noncontrast enhanced study. Gallbladder wall is not appreciably thickened. There is no biliary duct dilatation.  Spleen, pancreas, and adrenals appear within normal limits.  Both kidneys appear edematous with mild pelvicaliectasis and ureterectasis bilaterally. There is no renal mass on either side. There is no renal or ureteral calculus appreciable on either side. There is ureterectasis bilaterally.  In the pelvis, a catheter extends into the urinary bladder. The urinary bladder wall is thickened. There is marked prostatic enlargement with prostate tissue indistinguishable from the inferior posterior bladder wall. There is an inguinal hernia on the left containing fat only. There is diffuse enlargement of the seminal vesicles bilaterally. No mass apart from the prostate and bladder is appreciable on this study. There is no free pelvic fluid.  The appendix appears normal.  There is no appreciable bowel dilatation to suggest obstruction. No free air or portal venous air. There is a small ventral hernia which contains bowel but no bowel compromise.  There is no appreciable ascites, adenopathy, or abscess in the abdomen or pelvis. There is atherosclerotic change in the aorta but no appreciable aneurysm. There is degenerative change in the lumbar spine. There is spinal stenosis at L3-4 and L4-5 due to diffuse bony hypertrophy and disc protrusion. There are small probable cysts in the inferior left iliac crest. Bones are somewhat sclerotic in a generalized manner  overall.  IMPRESSION: There is marked enlargement of the prostate with moderate enlargement seminal vesicles. The prostate cannot be discerned from the inferior bladder base. This appearance is concerning for potential prostate neoplasm with bladder invasion. The wall of the urinary bladder is diffusely thickened. A small amount of air in the urinary bladder is felt to be due to the catheterization.  There is mild hydronephrosis in ureterectasis bilaterally with edematous kidneys. These findings are probably due  to stasis phenomenon from the changes in the pelvis. No renal or ureteral calculi are apparent on this study.  The bones are somewhat sclerotic in a generalized manner. Question metastatic disease from prostate carcinoma.  There is a small ventral hernia containing bowel but no bowel compromise. There is a small left inguinal hernia containing only fat. No bowel obstruction. No free air.   Electronically Signed   By: Lowella Grip M.D.   On: 03/25/2014 15:25     EKG Interpretation None     Consult: The patient's case was reviewed with Dr. Glo Herring. CT scan findings were reviewed with the prostatic enlargement and concerns for possible cancer. He reports these findings can be followed up in their office. He advises to check the creatinine for renal function. If elevated renal function patient a candidate for post obstructive nephropathy and could consider admission at that point time for medical issues. However regarding CT findings and prostatic abnormality, these could address in the office. MDM   Final diagnoses:  Hypertrophy of prostate with urinary retention   Patient presents with acute onset of urinary retention starting today. Foley catheter emptied 800 mils of urine. The patient does not have any renal dysfunction at this time, he is at his baseline. There is no evidence of UTI. Patient otherwise feels well at this point time. He will be discharged to follow up with urology and he is  also counseled to follow-up with his family physician Monday for ongoing monitoring. He is advised to return to the emergency department if he has any development of pain, fever, general illness or other concerns.   Charlesetta Shanks, MD 03/25/14 [phone]

## 2014-03-26 ENCOUNTER — Encounter (HOSPITAL_COMMUNITY): Payer: Self-pay

## 2014-03-26 ENCOUNTER — Emergency Department (HOSPITAL_COMMUNITY): Payer: Medicare Other

## 2014-03-26 ENCOUNTER — Emergency Department (HOSPITAL_COMMUNITY)
Admission: EM | Admit: 2014-03-26 | Discharge: 2014-03-26 | Disposition: A | Discharge status: Home / Self Care | Payer: Medicare Other | Attending: Emergency Medicine | Admitting: Emergency Medicine

## 2014-03-26 DIAGNOSIS — G4751 Confusional arousals: Secondary | ICD-10-CM | POA: Insufficient documentation

## 2014-03-26 DIAGNOSIS — I10 Essential (primary) hypertension: Secondary | ICD-10-CM | POA: Diagnosis not present

## 2014-03-26 DIAGNOSIS — R410 Disorientation, unspecified: Secondary | ICD-10-CM

## 2014-03-26 DIAGNOSIS — Z8701 Personal history of pneumonia (recurrent): Secondary | ICD-10-CM | POA: Insufficient documentation

## 2014-03-26 DIAGNOSIS — E162 Hypoglycemia, unspecified: Secondary | ICD-10-CM

## 2014-03-26 DIAGNOSIS — R202 Paresthesia of skin: Secondary | ICD-10-CM | POA: Diagnosis present

## 2014-03-26 DIAGNOSIS — Z7951 Long term (current) use of inhaled steroids: Secondary | ICD-10-CM | POA: Insufficient documentation

## 2014-03-26 DIAGNOSIS — Z794 Long term (current) use of insulin: Secondary | ICD-10-CM | POA: Insufficient documentation

## 2014-03-26 DIAGNOSIS — G8929 Other chronic pain: Secondary | ICD-10-CM | POA: Insufficient documentation

## 2014-03-26 DIAGNOSIS — E11649 Type 2 diabetes mellitus with hypoglycemia without coma: Secondary | ICD-10-CM | POA: Diagnosis not present

## 2014-03-26 DIAGNOSIS — E785 Hyperlipidemia, unspecified: Secondary | ICD-10-CM | POA: Insufficient documentation

## 2014-03-26 DIAGNOSIS — Z8719 Personal history of other diseases of the digestive system: Secondary | ICD-10-CM | POA: Insufficient documentation

## 2014-03-26 DIAGNOSIS — Z79899 Other long term (current) drug therapy: Secondary | ICD-10-CM | POA: Insufficient documentation

## 2014-03-26 LAB — URINALYSIS, ROUTINE W REFLEX MICROSCOPIC
Bilirubin Urine: NEGATIVE
Glucose, UA: NEGATIVE mg/dL
Ketones, ur: NEGATIVE mg/dL
Nitrite: NEGATIVE
Protein, ur: >300 mg/dL — AB
Specific Gravity, Urine: 1.018 (ref 1.005–1.030)
Urobilinogen, UA: 1 mg/dL (ref 0.0–1.0)
pH: 6 (ref 5.0–8.0)

## 2014-03-26 LAB — CBC WITH DIFFERENTIAL/PLATELET
Basophils Absolute: 0 10*3/uL (ref 0.0–0.1)
Basophils Relative: 0 % (ref 0–1)
Eosinophils Absolute: 0 10*3/uL (ref 0.0–0.7)
Eosinophils Relative: 0 % (ref 0–5)
HCT: 47.6 % (ref 39.0–52.0)
Hemoglobin: 15.9 g/dL (ref 13.0–17.0)
Lymphocytes Relative: 12 % (ref 12–46)
Lymphs Abs: 1 10*3/uL (ref 0.7–4.0)
MCH: 29.3 pg (ref 26.0–34.0)
MCHC: 33.4 g/dL (ref 30.0–36.0)
MCV: 87.7 fL (ref 78.0–100.0)
Monocytes Absolute: 0.5 10*3/uL (ref 0.1–1.0)
Monocytes Relative: 6 % (ref 3–12)
Neutro Abs: 6.4 10*3/uL (ref 1.7–7.7)
Neutrophils Relative %: 82 % — ABNORMAL HIGH (ref 43–77)
Platelets: 187 10*3/uL (ref 150–400)
RBC: 5.43 MIL/uL (ref 4.22–5.81)
RDW: 13.5 % (ref 11.5–15.5)
WBC: 7.8 10*3/uL (ref 4.0–10.5)

## 2014-03-26 LAB — COMPREHENSIVE METABOLIC PANEL
ALT: 13 U/L (ref 0–53)
AST: 27 U/L (ref 0–37)
Albumin: 3.8 g/dL (ref 3.5–5.2)
Alkaline Phosphatase: 54 U/L (ref 39–117)
Anion gap: 4 — ABNORMAL LOW (ref 5–15)
BUN: 10 mg/dL (ref 6–23)
CO2: 32 mmol/L (ref 19–32)
Calcium: 9.3 mg/dL (ref 8.4–10.5)
Chloride: 105 mmol/L (ref 96–112)
Creatinine, Ser: 1.2 mg/dL (ref 0.50–1.35)
GFR calc Af Amer: 71 mL/min — ABNORMAL LOW (ref >90)
GFR calc non Af Amer: 61 mL/min — ABNORMAL LOW (ref >90)
Glucose, Bld: 91 mg/dL (ref 70–99)
Potassium: 3.5 mmol/L (ref 3.5–5.1)
Sodium: 141 mmol/L (ref 135–145)
Total Bilirubin: 0.6 mg/dL (ref 0.3–1.2)
Total Protein: 7.5 g/dL (ref 6.0–8.3)

## 2014-03-26 LAB — TROPONIN I: Troponin I: <0.03 ng/mL (ref <0.031)

## 2014-03-26 LAB — CBG MONITORING, ED: Glucose-Capillary: 57 mg/dL — ABNORMAL LOW (ref 70–99)

## 2014-03-26 LAB — URINE MICROSCOPIC-ADD ON

## 2014-03-26 MED ORDER — CEPHALEXIN 500 MG PO CAPS: 500.0000 mg | ORAL_CAPSULE | Freq: Four times a day (QID) | ORAL | Status: DC

## 2014-03-26 MED ORDER — TRUEPLUS LANCETS 28G MISC: 1.0000 | Freq: Three times a day (TID) | Status: DC

## 2014-03-26 MED ORDER — GLUCOSE BLOOD VI STRP: ORAL_STRIP | Status: DC

## 2014-03-26 MED ORDER — TRUERESULT BLOOD GLUCOSE W/DEVICE KIT: 1.0000 | PACK | Freq: Three times a day (TID) | Status: DC

## 2014-03-26 NOTE — Discharge Instructions (Signed)
Blood Glucose Monitoring °Monitoring your blood glucose (also know as blood sugar) helps you to manage your diabetes. It also helps you and your health care provider monitor your diabetes and determine how well your treatment plan is working. °WHY SHOULD YOU MONITOR YOUR BLOOD GLUCOSE? °· It can help you understand how food, exercise, and medicine affect your blood glucose. °· It allows you to know what your blood glucose is at any given moment. You can quickly tell if you are having low blood glucose (hypoglycemia) or high blood glucose (hyperglycemia). °· It can help you and your health care provider know how to adjust your medicines. °· It can help you understand how to manage an illness or adjust medicine for exercise. °WHEN SHOULD YOU TEST? °Your health care provider will help you decide how often you should check your blood glucose. This may depend on the type of diabetes you have, your diabetes control, or the types of medicines you are taking. Be sure to write down all of your blood glucose readings so that this information can be reviewed with your health care provider. See below for examples of testing times that your health care provider may suggest. °Type 1 Diabetes °· Test 4 times a day if you are in good control, using an insulin pump, or perform multiple daily injections. °· If your diabetes is not well controlled or if you are sick, you may need to monitor more often. °· It is a good idea to also monitor: °¨ Before and after exercise. °¨ Between meals and 2 hours after a meal. °¨ Occasionally between 2:00 a.m. and 3:00 a.m. °Type 2 Diabetes °· It can vary with each person, but generally, if you are on insulin, test 4 times a day. °· If you take medicines by mouth (orally), test 2 times a day. °· If you are on a controlled diet, test once a day. °· If your diabetes is not well controlled or if you are sick, you may need to monitor more often. °HOW TO MONITOR YOUR BLOOD GLUCOSE °Supplies  Needed °· Blood glucose meter. °· Test strips for your meter. Each meter has its own strips. You must use the strips that go with your own meter. °· A pricking needle (lancet). °· A device that holds the lancet (lancing device). °· A journal or log book to write down your results. °Procedure °· Wash your hands with soap and water. Alcohol is not preferred. °· Prick the side of your finger (not the tip) with the lancet. °· Gently milk the finger until a small drop of blood appears. °· Follow the instructions that come with your meter for inserting the test strip, applying blood to the strip, and using your blood glucose meter. °Other Areas to Get Blood for Testing °Some meters allow you to use other areas of your body (other than your finger) to test your blood. These areas are called alternative sites. The most common alternative sites are: °· The forearm. °· The thigh. °· The back area of the lower leg. °· The palm of the hand. °The blood flow in these areas is slower. Therefore, the blood glucose values you get may be delayed, and the numbers are different from what you would get from your fingers. Do not use alternative sites if you think you are having hypoglycemia. Your reading will not be accurate. Always use a finger if you are having hypoglycemia. Also, if you cannot feel your lows (hypoglycemia unawareness), always use your fingers for your   blood glucose checks. ADDITIONAL TIPS FOR GLUCOSE MONITORING  Do not reuse lancets.  Always carry your supplies with you.  All blood glucose meters have a 24-hour "hotline" number to call if you have questions or need help.  Adjust (calibrate) your blood glucose meter with a control solution after finishing a few boxes of strips. BLOOD GLUCOSE RECORD KEEPING It is a good idea to keep a daily record or log of your blood glucose readings. Most glucose meters, if not all, keep your glucose records stored in the meter. Some meters come with the ability to download  your records to your home computer. Keeping a record of your blood glucose readings is especially helpful if you are wanting to look for patterns. Make notes to go along with the blood glucose readings because you might forget what happened at that exact time. Keeping good records helps you and your health care provider to work together to achieve good diabetes management.  Document Released: 02/15/2003 Document Revised: 06/29/2013 Document Reviewed: 07/07/2012 [facility] Patient Information 2015 [place], [place]. This information is not intended to replace advice given to you by your health care provider. Make sure you discuss any questions you have with your health care provider.  Low Blood Sugar Low blood sugar (hypoglycemia) means that the level of sugar in your blood is lower than it should be. Signs of low blood sugar include:  Getting sweaty.  Feeling hungry.  Feeling dizzy or weak.  Feeling sleepier than normal.  Feeling nervous.  Headaches.  Having a fast heartbeat. Low blood sugar can happen fast and can be an emergency. Your doctor can do tests to check your blood sugar level. You can have low blood sugar and not have diabetes. HOME CARE  Check your blood sugar as told by your doctor. If it is less than 70 mg/dl or as told by your doctor, take 1 of the following:  3 to 4 glucose tablets.   cup clear juice.   cup soda pop, not diet.  1 cup milk.  5 to 6 hard candies.  Recheck blood sugar after 15 minutes. Repeat until it is at the right level.  Eat a snack if it is more than 1 hour until the next meal.  Only take medicine as told by your doctor.  Do not skip meals. Eat on time.  Do not drink alcohol except with meals.  Check your blood glucose before driving.  Check your blood glucose before and after exercise.  Always carry treatment with you, such as glucose pills.  Always wear a medical alert bracelet if you have diabetes. GET HELP RIGHT AWAY IF:    Your blood glucose goes below 70 mg/dl or as told by your doctor, and you:  Are confused.  Are not able to swallow.  Pass out (faint).  You cannot treat yourself. You may need someone to help you.  You have low blood sugar problems often.  You have problems from your medicines.  You are not feeling better after 3 to 4 days.  You have vision changes. MAKE SURE YOU:   Understand these instructions.  Will watch this condition.  Will get help right away if you are not doing well or get worse. Document Released: 05/09/2009 Document Revised: 05/07/2011 Document Reviewed: 05/09/2009 [facility] Patient Information 2015 [place], [place]. This information is not intended to replace advice given to you by your health care provider. Make sure you discuss any questions you have with your health care provider.

## 2014-03-26 NOTE — ED Notes (Addendum)
Pt states he just hasn't felt like himself today. States he hasn't had a lot of sleep. Denies any pain. Brother noticed something wasn't right with him. States he has a leg bag on and is diabetic and has been feeling some tingling in both of his feet when he walks.

## 2014-03-26 NOTE — ED Notes (Signed)
Case management working with patient to prepare him to go home with needed equipment

## 2014-03-26 NOTE — ED Notes (Signed)
Pt discharged in cab with walker and bed side commode from case management

## 2014-03-26 NOTE — ED Notes (Addendum)
Pt does not have a CBG meter at home, also states he would like a bedside commode. States hasn't eaten since yesterday when he left here.

## 2014-03-26 NOTE — ED Notes (Signed)
Pt eating and drinking without difficulty, reports that he feels much better.

## 2014-03-26 NOTE — ED Provider Notes (Signed)
CSN: [id number]     Arrival date & time 03/26/14  1346 History   First MD Initiated Contact with Patient 03/26/14 1407     Chief Complaint  Patient presents with  . Tingling  . Hypoglycemia     (Consider location/radiation/quality/duration/timing/severity/associated sxs/prior Treatment) HPI Comments: Patient reports that he has been feeling shaky, weak and like he is in a fog for the last couple of hours. Patient is found to have low blood sugar in triage. He is a diabetic, takes oral diabetes medications. He reports that he has not eaten since yesterday. He did take his medications this morning. He does not have any headache or blurred vision. There is no focal numbness, tingling or weakness of extremities. He is not expressing any chest pain or shortness of breath.  Patient is a 69 y.o. male presenting with hypoglycemia.  Hypoglycemia   Past Medical History  Diagnosis Date  . Diabetes mellitus   . Chronic pain   . Hypertension   . Hyperlipidemia   . Sleep apnea   . Shortness of breath   . Inguinal hernia     right  . Pneumonia   . Numbness     T/O   Past Surgical History  Procedure Laterality Date  . Colon surgery    . Cataract extraction w/ intraocular lens implant Left 06/2012    laser for posterior capsule?  Marland Kitchen Tonsillectomy    . Posterior cervical fusion/foraminotomy N/A 11/12/2013    Procedure: Posterior cervical decompression fusion, cervical 3-4, cervical 4-5, cervical 5-6, cervical 6-7 with instrumentation and allograft   (LEVEL 4);  Surgeon: Sinclair Ship, MD;  Location: [facility];  Service: Orthopedics;  Laterality: N/A;  Posterior cervical decompression fusion, cervical 3-4, cervical 4-5, cervical 5-6, cervical 6-7 with instrumentation and allograft   Family History  Problem Relation Age of Onset  . Cancer - Other Other   . Diabetes Other   . Hypertension Other    History  Substance Use Topics  . Smoking status: Never Smoker   . Smokeless tobacco: Never  Used  . Alcohol Use: Yes     Comment: rare alcohol /beer    Review of Systems    Allergies  Tramadol  Home Medications   Prior to Admission medications   Medication Sig Start Date End Date Taking? Authorizing Provider  atorvastatin (LIPITOR) 20 MG tablet Take 1 tablet (20 mg total) by mouth daily. 04/08/13   Kandis Nab, MD  Blood Glucose Monitoring Suppl (ONE TOUCH ULTRA 2) W/DEVICE KIT Please dispense one unit. Patient to check blood sugar once every morning before breakfast. 02/26/14   Cordelia Poche, MD  cephALEXin (KEFLEX) 500 MG capsule Take 1 capsule (500 mg total) by mouth 4 (four) times daily. 03/26/14   Orpah Greek, MD  chlorpheniramine-HYDROcodone (TUSSIONEX PENNKINETIC ER) 10-8 MG/5ML LQCR Take 5 mLs by mouth every 12 (twelve) hours as needed for cough. Patient not taking: Reported on 03/25/2014 02/28/14   Carmin Muskrat, MD  Cholecalciferol (VITAMIN D3) 400 UNITS tablet Take 2 tablets (800 Units total) by mouth daily. Patient not taking: Reported on 03/25/2014 03/10/12   Vivi Ferns, MD  clonazePAM (KLONOPIN) 0.5 MG tablet Take 0.5 mg by mouth 2 (two) times daily as needed for anxiety.     Historical Provider, MD  Elastic Bandages & Supports (LUMBAR BACK BRACE/SUPPORT PAD) MISC 1 Device by Does not apply route daily. 10/28/12   Kandis Nab, MD  FLUZONE HIGH-DOSE 0.5 ML SUSY  01/05/14  Historical Provider, MD  gabapentin (NEURONTIN) 300 MG capsule Take 1 capsule (300 mg total) by mouth 3 (three) times daily. May increase to 2 capsules (665m total) 3 times daily as needed. Patient taking differently: Take 300 mg by mouth 2 (two) times daily. May increase to 2 capsules (60106mtotal) 3 times daily as needed. 03/22/14   AnMelvenia BeamMD  glimepiride (AMARYL) 4 MG tablet Take 1 tablet (4 mg total) by mouth daily. 04/08/13   StKandis NabMD  glucose blood (ONE TOUCH ULTRA TEST) test strip Check sugar 6 x daily 02/26/14   RaCordelia PocheMD  HYDROcodone-acetaminophen  (NORCO) 7.5-325 MG per tablet Take 1 tablet by mouth every 8 (eight) hours as needed for moderate pain. 03/03/14   RaCordelia PocheMD  ipratropium (ATROVENT) 0.06 % nasal spray Place 2 sprays into both nostrils 4 (four) times daily. Patient not taking: Reported on 03/25/2014 04/30/13   StKandis NabMD  ketoconazole (NIZORAL) 2 % cream Apply 1 application topically daily.    Historical Provider, MD  Lancets ([facility] FLTRAS[facility]FT) lancets Use as instructed 02/26/14   RaCordelia PocheMD  lisinopril (PRINIVIL,ZESTRIL) 20 MG tablet Take 1 tablet (20 mg total) by mouth daily. 03/03/14   RaCordelia PocheMD  metFORMIN (GLUCOPHAGE) 1000 MG tablet Take 1 tablet (1,000 mg total) by mouth 2 (two) times daily with a meal. 08/24/13   StKandis NabMD  Misc. Devices (CANE) MISC 1 Device by Does not apply route daily. Please fit one point cane for balance and support 10/28/12   StKandis NabMD  omeprazole (PRILOSEC) 20 MG capsule Take 1 capsule (20 mg total) by mouth daily. 08/24/13   StKandis NabMD  pantoprazole (PROTONIX) 40 MG tablet Take 40 mg by mouth daily.    Historical Provider, MD  potassium chloride (KLOR-CON) 20 MEQ packet Take 20 mEq by mouth 2 (two) times daily.     Historical Provider, MD  PREVNAR 13 SUSP injection  01/05/14   Historical Provider, MD  rasagiline (AZILECT) 1 MG TABS tablet Take 1 tablet (1 mg total) by mouth daily. 03/22/14   AnMelvenia BeamMD  senna (SENOKOT) 8.6 MG TABS tablet Take 2 tablets (17.2 mg total) by mouth daily. Patient taking differently: Take 1 tablet by mouth daily.  02/05/14   RaCordelia PocheMD  tamsulosin (FLOMAX) 0.4 MG CAPS capsule Take 1 capsule (0.4 mg total) by mouth daily after supper. 03/25/14   MaCharlesetta ShanksMD  terbinafine (LAMISIL) 250 MG tablet Take 1 tablet (250 mg total) by mouth daily. Patient not taking: Reported on 03/25/2014 04/09/13   StKandis NabMD   BP 182/95 mmHg  Pulse 87  Temp(Src) 97.6 F (36.4 C) (Oral)  Resp 18  SpO2 100% Physical  Exam  ED Course  Procedures (including critical care time) Labs Review Labs Reviewed  CBC WITH DIFFERENTIAL/PLATELET - Abnormal; Notable for the following:    Neutrophils Relative % 82 (*)    All other components within normal limits  COMPREHENSIVE METABOLIC PANEL - Abnormal; Notable for the following:    GFR calc non Af Amer 61 (*)    GFR calc Af Amer 71 (*)    Anion gap 4 (*)    All other components within normal limits  URINALYSIS, ROUTINE W REFLEX MICROSCOPIC - Abnormal; Notable for the following:    APPearance CLOUDY (*)    Hgb urine dipstick LARGE (*)    Protein, ur >300 (*)  Leukocytes, UA MODERATE (*)    All other components within normal limits  CBG MONITORING, ED - Abnormal; Notable for the following:    Glucose-Capillary 57 (*)    All other components within normal limits  TROPONIN I  URINE MICROSCOPIC-ADD ON  CBG MONITORING, ED    Imaging Review Dg Chest 2 View  03/26/2014   CLINICAL DATA:  Cough and congestion for several months.  EXAM: CHEST  2 VIEW  COMPARISON:  February 28, 2014.  FINDINGS: The heart size and mediastinal contours are within normal limits. Both lungs are clear. No pneumothorax or pleural effusion is noted. The visualized skeletal structures are unremarkable.  IMPRESSION: No acute cardiopulmonary abnormality seen.   Electronically Signed   By: Sabino Dick M.D.   On: 03/26/2014 15:32   Ct Head Wo Contrast  03/26/2014   CLINICAL DATA:  Confusion.  Weakness.  EXAM: CT HEAD WITHOUT CONTRAST  TECHNIQUE: Contiguous axial images were obtained from the base of the skull through the vertex without intravenous contrast.  COMPARISON:  None.  FINDINGS: Bony calvarium appears intact. No mass effect or midline shift is noted. Ventricular size is within normal limits. There is no evidence of mass lesion, hemorrhage or acute infarction.  IMPRESSION: Normal head CT.   Electronically Signed   By: Sabino Dick M.D.   On: 03/26/2014 15:44   Ct Renal Stone  Study  03/25/2014   CLINICAL DATA:  Urinary retention for 1 day  EXAM: CT ABDOMEN AND PELVIS WITHOUT CONTRAST  TECHNIQUE: Multidetector CT imaging of the abdomen and pelvis was performed following the standard protocol without oral or intravenous contrast material administration.  COMPARISON:  None.  FINDINGS: Lung bases are clear.  No focal liver lesions are identified on this noncontrast enhanced study. Gallbladder wall is not appreciably thickened. There is no biliary duct dilatation.  Spleen, pancreas, and adrenals appear within normal limits.  Both kidneys appear edematous with mild pelvicaliectasis and ureterectasis bilaterally. There is no renal mass on either side. There is no renal or ureteral calculus appreciable on either side. There is ureterectasis bilaterally.  In the pelvis, a catheter extends into the urinary bladder. The urinary bladder wall is thickened. There is marked prostatic enlargement with prostate tissue indistinguishable from the inferior posterior bladder wall. There is an inguinal hernia on the left containing fat only. There is diffuse enlargement of the seminal vesicles bilaterally. No mass apart from the prostate and bladder is appreciable on this study. There is no free pelvic fluid.  The appendix appears normal.  There is no appreciable bowel dilatation to suggest obstruction. No free air or portal venous air. There is a small ventral hernia which contains bowel but no bowel compromise.  There is no appreciable ascites, adenopathy, or abscess in the abdomen or pelvis. There is atherosclerotic change in the aorta but no appreciable aneurysm. There is degenerative change in the lumbar spine. There is spinal stenosis at L3-4 and L4-5 due to diffuse bony hypertrophy and disc protrusion. There are small probable cysts in the inferior left iliac crest. Bones are somewhat sclerotic in a generalized manner overall.  IMPRESSION: There is marked enlargement of the prostate with moderate  enlargement seminal vesicles. The prostate cannot be discerned from the inferior bladder base. This appearance is concerning for potential prostate neoplasm with bladder invasion. The wall of the urinary bladder is diffusely thickened. A small amount of air in the urinary bladder is felt to be due to the catheterization.  There is mild  hydronephrosis in ureterectasis bilaterally with edematous kidneys. These findings are probably due to stasis phenomenon from the changes in the pelvis. No renal or ureteral calculi are apparent on this study.  The bones are somewhat sclerotic in a generalized manner. Question metastatic disease from prostate carcinoma.  There is a small ventral hernia containing bowel but no bowel compromise. There is a small left inguinal hernia containing only fat. No bowel obstruction. No free air.   Electronically Signed   By: Lowella Grip M.D.   On: 03/25/2014 15:25     EKG Interpretation   Date/Time:  Friday March 26 2014 15:01:12 EST Ventricular Rate:  82 PR Interval:    QRS Duration: 100 QT Interval:  389 QTC Calculation: 454 R Axis:   8 Text Interpretation:  Normal sinus rhythm Normal ECG Confirmed by POLLINA   MD, CHRISTOPHER [phone]) on 03/26/2014 3:39:27 PM      MDM   Final diagnoses:  Confusion  Hypoglycemia    Presents to the ER for evaluation of confusion. Patient is found to have hypoglycemia upon arrival. He has taken his medications today without eating. He had a nonfocal examination. Workup has been entirely normal except for hypoglycemia and some possible signs of urinary infection. He recently had urinary retention, had a Foley catheter placed yesterday. Will initiate antibiotic coverage empirically, culture pending. Case management consultation to help him with his medications and glucose meter.    Orpah Greek, MD 03/26/14 [phone]

## 2014-03-27 ENCOUNTER — Encounter (HOSPITAL_COMMUNITY): Payer: Self-pay | Admitting: *Deleted

## 2014-03-27 ENCOUNTER — Emergency Department (HOSPITAL_COMMUNITY)
Admission: EM | Admit: 2014-03-27 | Discharge: 2014-03-27 | Disposition: A | Discharge status: Home / Self Care | Payer: Medicare Other | Attending: Emergency Medicine | Admitting: Emergency Medicine

## 2014-03-27 DIAGNOSIS — Z8669 Personal history of other diseases of the nervous system and sense organs: Secondary | ICD-10-CM | POA: Diagnosis not present

## 2014-03-27 DIAGNOSIS — I10 Essential (primary) hypertension: Secondary | ICD-10-CM | POA: Insufficient documentation

## 2014-03-27 DIAGNOSIS — E785 Hyperlipidemia, unspecified: Secondary | ICD-10-CM | POA: Insufficient documentation

## 2014-03-27 DIAGNOSIS — Z79899 Other long term (current) drug therapy: Secondary | ICD-10-CM | POA: Insufficient documentation

## 2014-03-27 DIAGNOSIS — G8929 Other chronic pain: Secondary | ICD-10-CM | POA: Insufficient documentation

## 2014-03-27 DIAGNOSIS — Z8719 Personal history of other diseases of the digestive system: Secondary | ICD-10-CM | POA: Insufficient documentation

## 2014-03-27 DIAGNOSIS — Z8701 Personal history of pneumonia (recurrent): Secondary | ICD-10-CM | POA: Insufficient documentation

## 2014-03-27 DIAGNOSIS — T83038A Leakage of other indwelling urethral catheter, initial encounter: Secondary | ICD-10-CM | POA: Diagnosis present

## 2014-03-27 DIAGNOSIS — E162 Hypoglycemia, unspecified: Secondary | ICD-10-CM

## 2014-03-27 DIAGNOSIS — T839XXA Unspecified complication of genitourinary prosthetic device, implant and graft, initial encounter: Secondary | ICD-10-CM

## 2014-03-27 DIAGNOSIS — Y846 Urinary catheterization as the cause of abnormal reaction of the patient, or of later complication, without mention of misadventure at the time of the procedure: Secondary | ICD-10-CM | POA: Insufficient documentation

## 2014-03-27 DIAGNOSIS — E11649 Type 2 diabetes mellitus with hypoglycemia without coma: Secondary | ICD-10-CM | POA: Insufficient documentation

## 2014-03-27 LAB — CBG MONITORING, ED
Glucose-Capillary: 104 mg/dL — ABNORMAL HIGH (ref 70–99)
Glucose-Capillary: 39 mg/dL — CL (ref 70–99)

## 2014-03-27 NOTE — ED Notes (Signed)
Foley irrigated without difficulty. [place], [place] at bedside and is aware.

## 2014-03-27 NOTE — Discharge Instructions (Signed)
Return to the emergency room with worsening of symptoms, new symptoms or with symptoms that are concerning, new numbness, tingling, weakness, visual changes, slurred speech. Follow-up with your primary care provider for your diabetes and problems with low blood sugar. Get your glucometer and use daily. Use below instructions for monitoring. Check blood sugar 4 times daily. Follow-up with urology as scheduled in 4 days. Read below information and follow all instructions. Start taking your antibiotic. Please take all of your antibiotics until finished!   You may develop abdominal discomfort or diarrhea from the antibiotic.  You may help offset this with probiotics which you can buy or get in yogurt. Do not eat  or take the probiotics until 2 hours after your antibiotic.     Blood Glucose Monitoring Monitoring your blood glucose (also know as blood sugar) helps you to manage your diabetes. It also helps you and your health care provider monitor your diabetes and determine how well your treatment plan is working. WHY SHOULD YOU MONITOR YOUR BLOOD GLUCOSE?  It can help you understand how food, exercise, and medicine affect your blood glucose.  It allows you to know what your blood glucose is at any given moment. You can quickly tell if you are having low blood glucose (hypoglycemia) or high blood glucose (hyperglycemia).  It can help you and your health care provider know how to adjust your medicines.  It can help you understand how to manage an illness or adjust medicine for exercise. WHEN SHOULD YOU TEST? Your health care provider will help you decide how often you should check your blood glucose. This may depend on the type of diabetes you have, your diabetes control, or the types of medicines you are taking. Be sure to write down all of your blood glucose readings so that this information can be reviewed with your health care provider. See below for examples of testing times that your health care  provider may suggest. Type 1 Diabetes  Test 4 times a day if you are in good control, using an insulin pump, or perform multiple daily injections.  If your diabetes is not well controlled or if you are sick, you may need to monitor more often.  It is a good idea to also monitor:  Before and after exercise.  Between meals and 2 hours after a meal.  Occasionally between 2:00 a.m. and 3:00 a.m. Type 2 Diabetes  It can vary with each person, but generally, if you are on insulin, test 4 times a day.  If you take medicines by mouth (orally), test 2 times a day.  If you are on a controlled diet, test once a day.  If your diabetes is not well controlled or if you are sick, you may need to monitor more often. HOW TO MONITOR YOUR BLOOD GLUCOSE Supplies Needed  Blood glucose meter.  Test strips for your meter. Each meter has its own strips. You must use the strips that go with your own meter.  A pricking needle (lancet).  A device that holds the lancet (lancing device).  A journal or log book to write down your results. Procedure  Wash your hands with soap and water. Alcohol is not preferred.  Prick the side of your finger (not the tip) with the lancet.  Gently milk the finger until a small drop of blood appears.  Follow the instructions that come with your meter for inserting the test strip, applying blood to the strip, and using your blood glucose meter. Other  Areas to Get Blood for Testing Some meters allow you to use other areas of your body (other than your finger) to test your blood. These areas are called alternative sites. The most common alternative sites are:  The forearm.  The thigh.  The back area of the lower leg.  The palm of the hand. The blood flow in these areas is slower. Therefore, the blood glucose values you get may be delayed, and the numbers are different from what you would get from your fingers. Do not use alternative sites if you think you are  having hypoglycemia. Your reading will not be accurate. Always use a finger if you are having hypoglycemia. Also, if you cannot feel your lows (hypoglycemia unawareness), always use your fingers for your blood glucose checks. ADDITIONAL TIPS FOR GLUCOSE MONITORING  Do not reuse lancets.  Always carry your supplies with you.  All blood glucose meters have a 24-hour "hotline" number to call if you have questions or need help.  Adjust (calibrate) your blood glucose meter with a control solution after finishing a few boxes of strips. BLOOD GLUCOSE RECORD KEEPING It is a good idea to keep a daily record or log of your blood glucose readings. Most glucose meters, if not all, keep your glucose records stored in the meter. Some meters come with the ability to download your records to your home computer. Keeping a record of your blood glucose readings is especially helpful if you are wanting to look for patterns. Make notes to go along with the blood glucose readings because you might forget what happened at that exact time. Keeping good records helps you and your health care provider to work together to achieve good diabetes management.  Document Released: 02/15/2003 Document Revised: 06/29/2013 Document Reviewed: 07/07/2012 [facility] Patient Information 2015 [place], [place]. This information is not intended to replace advice given to you by your health care provider. Make sure you discuss any questions you have with your health care provider.  Low Blood Sugar Low blood sugar (hypoglycemia) means that the level of sugar in your blood is lower than it should be. Signs of low blood sugar include:  Getting sweaty.  Feeling hungry.  Feeling dizzy or weak.  Feeling sleepier than normal.  Feeling nervous.  Headaches.  Having a fast heartbeat. Low blood sugar can happen fast and can be an emergency. Your doctor can do tests to check your blood sugar level. You can have low blood sugar and not have  diabetes. HOME CARE  Check your blood sugar as told by your doctor. If it is less than 70 mg/dl or as told by your doctor, take 1 of the following:  3 to 4 glucose tablets.   cup clear juice.   cup soda pop, not diet.  1 cup milk.  5 to 6 hard candies.  Recheck blood sugar after 15 minutes. Repeat until it is at the right level.  Eat a snack if it is more than 1 hour until the next meal.  Only take medicine as told by your doctor.  Do not skip meals. Eat on time.  Do not drink alcohol except with meals.  Check your blood glucose before driving.  Check your blood glucose before and after exercise.  Always carry treatment with you, such as glucose pills.  Always wear a medical alert bracelet if you have diabetes. GET HELP RIGHT AWAY IF:   Your blood glucose goes below 70 mg/dl or as told by your doctor, and you:  Are confused.  Are not able to swallow.  Pass out (faint).  You cannot treat yourself. You may need someone to help you.  You have low blood sugar problems often.  You have problems from your medicines.  You are not feeling better after 3 to 4 days.  You have vision changes. MAKE SURE YOU:   Understand these instructions.  Will watch this condition.  Will get help right away if you are not doing well or get worse. Document Released: 05/09/2009 Document Revised: 05/07/2011 Document Reviewed: 05/09/2009 [facility] Patient Information 2015 [place], [place]. This information is not intended to replace advice given to you by your health care provider. Make sure you discuss any questions you have with your health care provider.

## 2014-03-27 NOTE — ED Notes (Signed)
Pt brought in via ems from home. Pt woke up this am with foley catheter leaking. Pt was seen in ED yesterday for urinary retention. Pt has leg bag on and was due to have antibiotic filled but pt did not get filled. Pt also with hypoglycemia per ems of 51. Pt w/o any s/s of hypoglycemia. Pt able to ambulate to bed.

## 2014-03-27 NOTE — ED Provider Notes (Signed)
CSN: [id number]     Arrival date & time 03/27/14  0540 History   First MD Initiated Contact with Patient 03/27/14 [phone]     Chief Complaint  Patient presents with  . foley catheter problem   . Hypoglycemia     (Consider location/radiation/quality/duration/timing/severity/associated sxs/prior Treatment) HPI  Tyler Lester is a 69 y.o. male with PMH of diabetes on metformin and glimepiride, hypertension, hyperlipidemia presenting with complaint of catheter leaking and found to be hypoglycemic to 37 in the ED waiting room. Patient stated he ate last night and took his medications last night. 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 A Foley was placed at that time. Patient presented yesterday as well and diagnosed with UTI. Culture still pending. Patient has not had his medication refill. Patient stated he woke up in his pajamas or wet. He denies foul smell or cloudy urine. Patient denies any headache, visual changes, slurred speech, numbness, tingling. No chest pain or shortness of breath. No back pain. No abdominal pain.   Past Medical History  Diagnosis Date  . Diabetes mellitus   . Chronic pain   . Hypertension   . Hyperlipidemia   . Sleep apnea   . Shortness of breath   . Inguinal hernia     right  . Pneumonia   . Numbness     T/O   Past Surgical History  Procedure Laterality Date  . Colon surgery    . Cataract extraction w/ intraocular lens implant Left 06/2012    laser for posterior capsule?  Marland Kitchen Tonsillectomy    . Posterior cervical fusion/foraminotomy N/A 11/12/2013    Procedure: Posterior cervical decompression fusion, cervical 3-4, cervical 4-5, cervical 5-6, cervical 6-7 with instrumentation and allograft   (LEVEL 4);  Surgeon: Sinclair Ship, MD;  Location: [place];  Service: Orthopedics;  Laterality: N/A;  Posterior cervical decompression fusion, cervical 3-4, cervical  4-5, cervical 5-6, cervical 6-7 with instrumentation and allograft   Family History  Problem Relation Age of Onset  . Cancer - Other Other   . Diabetes Other   . Hypertension Other    History  Substance Use Topics  . Smoking status: Never Smoker   . Smokeless tobacco: Never Used  . Alcohol Use: Yes     Comment: rare alcohol /beer    Review of Systems 10 Systems reviewed and are negative for acute change except as noted in the HPI.    Allergies  Tramadol  Home Medications   Prior to Admission medications   Medication Sig Start Date End Date Taking? Authorizing Provider  atorvastatin (LIPITOR) 20 MG tablet Take 1 tablet (20 mg total) by mouth daily. 04/08/13  Yes Kandis Nab, MD  clonazePAM (KLONOPIN) 0.5 MG tablet Take 0.5 mg by mouth 2 (two) times daily as needed for anxiety.    Yes Historical Provider, MD  gabapentin (NEURONTIN) 300 MG capsule Take 1 capsule (300 mg total) by mouth 3 (three) times daily. May increase to 2 capsules (647m total) 3 times daily as needed. Patient taking differently: Take 300 mg by mouth 2 (two) times daily. May increase to 2 capsules (6020mtotal) 3 times daily as needed. 03/22/14  Yes AnMelvenia BeamMD  glimepiride (AMARYL) 4 MG tablet Take 1 tablet (4 mg total) by mouth daily. 04/08/13  Yes StKandis NabMD  lisinopril (PRINIVIL,ZESTRIL) 20 MG tablet Take 1 tablet (20 mg total) by mouth  daily. 03/03/14  Yes Cordelia Poche, MD  omeprazole (PRILOSEC) 20 MG capsule Take 1 capsule (20 mg total) by mouth daily. 08/24/13  Yes Kandis Nab, MD  pantoprazole (PROTONIX) 40 MG tablet Take 40 mg by mouth daily.   Yes Historical Provider, MD  potassium chloride (KLOR-CON) 20 MEQ packet Take 20 mEq by mouth 2 (two) times daily.    Yes Historical Provider, MD  tamsulosin (FLOMAX) 0.4 MG CAPS capsule Take 1 capsule (0.4 mg total) by mouth daily after supper. 03/25/14  Yes Charlesetta Shanks, MD  Blood Glucose Monitoring Suppl (TRUERESULT BLOOD GLUCOSE)  W/DEVICE KIT 1 kit by Does not apply route 3 (three) times daily. 03/26/14   Hannah Muthersbaugh, PA-C  cephALEXin (KEFLEX) 500 MG capsule Take 1 capsule (500 mg total) by mouth 4 (four) times daily. 03/26/14   Orpah Greek, MD  chlorpheniramine-HYDROcodone (TUSSIONEX PENNKINETIC ER) 10-8 MG/5ML LQCR Take 5 mLs by mouth every 12 (twelve) hours as needed for cough. Patient not taking: Reported on 03/25/2014 02/28/14   Carmin Muskrat, MD  Cholecalciferol (VITAMIN D3) 400 UNITS tablet Take 2 tablets (800 Units total) by mouth daily. Patient not taking: Reported on 03/25/2014 03/10/12   Vivi Ferns, MD  Elastic Bandages & Supports (LUMBAR BACK BRACE/SUPPORT PAD) MISC 1 Device by Does not apply route daily. 10/28/12   Kandis Nab, MD  FLUZONE HIGH-DOSE 0.5 ML SUSY  01/05/14   Historical Provider, MD  glucose blood test strip Use as instructed 03/26/14   Jarrett Soho Muthersbaugh, PA-C  HYDROcodone-acetaminophen (NORCO) 7.5-325 MG per tablet Take 1 tablet by mouth every 8 (eight) hours as needed for moderate pain. 03/03/14   Cordelia Poche, MD  ipratropium (ATROVENT) 0.06 % nasal spray Place 2 sprays into both nostrils 4 (four) times daily. Patient not taking: Reported on 03/25/2014 04/30/13   Kandis Nab, MD  ketoconazole (NIZORAL) 2 % cream Apply 1 application topically daily.    Historical Provider, MD  metFORMIN (GLUCOPHAGE) 1000 MG tablet Take 1 tablet (1,000 mg total) by mouth 2 (two) times daily with a meal. 08/24/13   Kandis Nab, MD  Misc. Devices (CANE) MISC 1 Device by Does not apply route daily. Please fit one point cane for balance and support 10/28/12   Kandis Nab, MD  PREVNAR 13 SUSP injection Inject 0.5 mLs into the muscle.  01/05/14   Historical Provider, MD  rasagiline (AZILECT) 1 MG TABS tablet Take 1 tablet (1 mg total) by mouth daily. 03/22/14   Melvenia Beam, MD  senna (SENOKOT) 8.6 MG TABS tablet Take 2 tablets (17.2 mg total) by mouth daily. Patient taking differently:  Take 1 tablet by mouth daily.  02/05/14   Cordelia Poche, MD  terbinafine (LAMISIL) 250 MG tablet Take 1 tablet (250 mg total) by mouth daily. Patient not taking: Reported on 03/25/2014 04/09/13   Kandis Nab, MD  TRUEPLUS LANCETS 28G MISC 1 Device by Does not apply route 3 (three) times daily. 03/26/14   Hannah Muthersbaugh, PA-C   BP 180/98 mmHg  Pulse 88  Temp(Src) 97.7 F (36.5 C) (Oral)  Resp 16  SpO2 99% Physical Exam  Constitutional: He appears well-developed and well-nourished. No distress.  HENT:  Head: Normocephalic and atraumatic.  Mouth/Throat: Oropharynx is clear and moist.  Eyes: Conjunctivae and EOM are normal. Pupils are equal, round, and reactive to light. Right eye exhibits no discharge. Left eye exhibits no discharge.  Neck: Normal range of motion. Neck supple.  Cardiovascular: Normal rate, regular  rhythm and normal heart sounds.   Pulmonary/Chest: Effort normal and breath sounds normal. No respiratory distress. He has no wheezes.  Abdominal: Soft. Bowel sounds are normal. He exhibits no distension. There is no tenderness.  Genitourinary:  Normal appearing circumcised penis without erythema, lesions, swelling. Foley catheter in place without evidence of infection. Urethra is erythematous. No penile discharge. Scrotum without edema, erythema or lesions. Normal testicular lie. No tenderness to palpation.  Neurological: He is alert. No cranial nerve deficit. He exhibits normal muscle tone. Coordination normal.  Speech is clear and goal oriented. Strength 5/5 in upper and lower extremities. Sensation intact. No pronator drift. Normal gait.   Skin: Skin is warm and dry. He is not diaphoretic.  Nursing note and vitals reviewed.   ED Course  Procedures (including critical care time) Labs Review Labs Reviewed  CBG MONITORING, ED - Abnormal; Notable for the following:    Glucose-Capillary 39 (*)    All other components within normal limits  CBG MONITORING, ED -  Abnormal; Notable for the following:    Glucose-Capillary 104 (*)    All other components within normal limits  CBG MONITORING, ED    Imaging Review Dg Chest 2 View  03/26/2014   CLINICAL DATA:  Cough and congestion for several months.  EXAM: CHEST  2 VIEW  COMPARISON:  February 28, 2014.  FINDINGS: The heart size and mediastinal contours are within normal limits. Both lungs are clear. No pneumothorax or pleural effusion is noted. The visualized skeletal structures are unremarkable.  IMPRESSION: No acute cardiopulmonary abnormality seen.   Electronically Signed   By: Sabino Dick M.D.   On: 03/26/2014 15:32   Ct Head Wo Contrast  03/26/2014   CLINICAL DATA:  Confusion.  Weakness.  EXAM: CT HEAD WITHOUT CONTRAST  TECHNIQUE: Contiguous axial images were obtained from the base of the skull through the vertex without intravenous contrast.  COMPARISON:  None.  FINDINGS: Bony calvarium appears intact. No mass effect or midline shift is noted. Ventricular size is within normal limits. There is no evidence of mass lesion, hemorrhage or acute infarction.  IMPRESSION: Normal head CT.   Electronically Signed   By: Sabino Dick M.D.   On: 03/26/2014 15:44   Ct Renal Stone Study  03/25/2014   CLINICAL DATA:  Urinary retention for 1 day  EXAM: CT ABDOMEN AND PELVIS WITHOUT CONTRAST  TECHNIQUE: Multidetector CT imaging of the abdomen and pelvis was performed following the standard protocol without oral or intravenous contrast material administration.  COMPARISON:  None.  FINDINGS: Lung bases are clear.  No focal liver lesions are identified on this noncontrast enhanced study. Gallbladder wall is not appreciably thickened. There is no biliary duct dilatation.  Spleen, pancreas, and adrenals appear within normal limits.  Both kidneys appear edematous with mild pelvicaliectasis and ureterectasis bilaterally. There is no renal mass on either side. There is no renal or ureteral calculus appreciable on either side. There is  ureterectasis bilaterally.  In the pelvis, a catheter extends into the urinary bladder. The urinary bladder wall is thickened. There is marked prostatic enlargement with prostate tissue indistinguishable from the inferior posterior bladder wall. There is an inguinal hernia on the left containing fat only. There is diffuse enlargement of the seminal vesicles bilaterally. No mass apart from the prostate and bladder is appreciable on this study. There is no free pelvic fluid.  The appendix appears normal.  There is no appreciable bowel dilatation to suggest obstruction. No free air or portal venous  air. There is a small ventral hernia which contains bowel but no bowel compromise.  There is no appreciable ascites, adenopathy, or abscess in the abdomen or pelvis. There is atherosclerotic change in the aorta but no appreciable aneurysm. There is degenerative change in the lumbar spine. There is spinal stenosis at L3-4 and L4-5 due to diffuse bony hypertrophy and disc protrusion. There are small probable cysts in the inferior left iliac crest. Bones are somewhat sclerotic in a generalized manner overall.  IMPRESSION: There is marked enlargement of the prostate with moderate enlargement seminal vesicles. The prostate cannot be discerned from the inferior bladder base. This appearance is concerning for potential prostate neoplasm with bladder invasion. The wall of the urinary bladder is diffusely thickened. A small amount of air in the urinary bladder is felt to be due to the catheterization.  There is mild hydronephrosis in ureterectasis bilaterally with edematous kidneys. These findings are probably due to stasis phenomenon from the changes in the pelvis. No renal or ureteral calculi are apparent on this study.  The bones are somewhat sclerotic in a generalized manner. Question metastatic disease from prostate carcinoma.  There is a small ventral hernia containing bowel but no bowel compromise. There is a small left  inguinal hernia containing only fat. No bowel obstruction. No free air.   Electronically Signed   By: Lowella Grip M.D.   On: 03/25/2014 15:25     EKG Interpretation None      MDM   Final diagnoses:  Hypoglycemia  Foley catheter problem, initial encounter   Patient with complaint of Foley leakage. It was placed 2 days ago due to urinary retention. Patient evaluated yesterday and diagnosed with possible UTI. He was given Keflex and has not taken any of this medication. Foley in place without evidence of external infection or leakage. No abdominal tenderness on exam. Foley flushed without evidence of leakage. Nurse did note only 7 mL of balloon and 3 ml added. Do not feel Foley needs to be replaced. Stress importance of patient obtaining anabiotic. Culture results not available. Patient with urology follow-up in 4 days.  Hypoglycemia.  CBG 39. After eating sandwich and crackers in ED CBG recheck 104. Patient without any symptoms. Normal neurological exam. Patient to get, and are filled and take glucose level 4 times a day. Discuss glucose monitoring and reasons to return to emergency room immediately and patient verbalizes understanding. Patient stable for discharge. Patient to follow-up with PCP.  Discussed return precautions with patient. Discussed all results and patient verbalizes understanding and agrees with plan.  Case has been discussed with Dr. Sharol Given who agrees with the above plan and to discharge.      Pura Spice, PA-C 03/27/14 [place], MD 03/27/14 [phone]

## 2014-03-27 NOTE — ED Notes (Signed)
3 cc added to balloon of foley cath. Only 7 cc in balloon. Will continue to monitor.

## 2014-03-27 NOTE — ED Notes (Signed)
Pt given oj with sugar and a sandwich. Pt does have any s/s of hypoglycemia.

## 2014-03-28 LAB — URINE CULTURE
Colony Count: NO GROWTH
Culture: NO GROWTH

## 2014-03-28 NOTE — Care Management (Signed)
ED CM consulted by Dr. Betsey Holiday concerning recommendation for HHPT/ DME. Spoke with patient regarding the recommendation for rolling walker and 3 N 1 potty chair, patient declines HHPT but is agreeable with plan for equipment. Reviewed record verified insurance and demographics on face sheet. Offered choice of agency for medical equipment, list provided. Patient selected [facility], referral called in to [place].  Patient questioned about transportation home, he states, he has a ride and he would be able to transport equipment. Discussed  with Dr. Betsey Holiday he is agreeable with discharge plan.  Spoke with Raquel Sarna RN regarding contacting patient transportation. Raquel Sarna was unable to contact the transporter by number provided. Contacted CSW about arranging transportation home, and will arrange taxi home with equipment. No further CM needs identified.

## 2014-03-29 LAB — CBG MONITORING, ED: Glucose-Capillary: 89 mg/dL (ref 70–99)

## 2014-03-30 ENCOUNTER — Telehealth: Payer: Self-pay | Admitting: Family Medicine

## 2014-03-30 NOTE — Telephone Encounter (Signed)
Tyler Lester called and asked that the rx for his hydrocodone be discontinued

## 2014-03-31 ENCOUNTER — Ambulatory Visit: Payer: Medicare Other | Admitting: Family Medicine

## 2014-04-05 ENCOUNTER — Encounter: Payer: Self-pay | Admitting: Family Medicine

## 2014-04-05 ENCOUNTER — Ambulatory Visit (INDEPENDENT_AMBULATORY_CARE_PROVIDER_SITE_OTHER): Payer: Medicare Other | Admitting: Family Medicine

## 2014-04-05 VITALS — BP 179/94 | HR 83 | Temp 97.7°F | Ht 69.0 in | Wt 197.0 lb

## 2014-04-05 DIAGNOSIS — E1140 Type 2 diabetes mellitus with diabetic neuropathy, unspecified: Secondary | ICD-10-CM

## 2014-04-05 DIAGNOSIS — E1149 Type 2 diabetes mellitus with other diabetic neurological complication: Secondary | ICD-10-CM

## 2014-04-05 DIAGNOSIS — R339 Retention of urine, unspecified: Secondary | ICD-10-CM

## 2014-04-05 LAB — GLUCOSE, POCT (MANUAL RESULT ENTRY): POC Glucose: 115 mg/dl — AB (ref 70–99)

## 2014-04-05 MED ORDER — GLUCOSE BLOOD VI STRP: ORAL_STRIP | Status: DC

## 2014-04-05 NOTE — Assessment & Plan Note (Signed)
Patient with recent acute urinary retention. Foley catheter is in place (thigh bag). Home health order placed so the patient can get teaching regarding his Foley catheter into get a larger bag to be used at night.

## 2014-04-05 NOTE — Patient Instructions (Signed)
It was nice to see you today.  Your sugars have been too low. STOP taking the glipizide.  Continue the metformin as prescribed.  Follow up w/ Dr. Lonny Prude as indicated (at least in the next 3 months).  Take care  Dr. Lacinda Axon

## 2014-04-05 NOTE — Progress Notes (Signed)
   Subjective:    Patient ID: Tyler Lester, male    DOB: 11-27-1945, 69 y.o.   MRN: [id number]  HPI 69 year old male with past medical history of hypertension, chronic pain, and DM 2 presents today for follow-up regarding diabetes/hypoglycemia.  Patient has recently been seen in the ED with symptomatic hypoglycemia.   1) DM-2  CBG's - Not checking at home.  Medications - Amaryl, Metformin.  Compliance - Yes  Medication side effects  Hypoglycemia: yes. Patient was seen on 1/29 in the emergency department with confusion. Patient was found to have a CBG of 57.  He had additional visit to the emergency department 1/30. At that time he was found to have a CBG of 37.  Review of Systems Per HPI    Objective:   Physical Exam Filed Vitals:   04/05/14 1527  BP: 179/94  Pulse: 83  Temp: 97.7 F (36.5 C)  Exam: General: well appearing male in NAD. Abdomen: soft, NT, ND. GU: foley catheter in place.      Assessment & Plan:  See Problem List

## 2014-04-05 NOTE — Assessment & Plan Note (Signed)
Patient had an A1c of 5.3 on 03/03/14. Given A1c and hypoglycemic events, will discontinue sulfonylurea. Patient to follow closely with PCP.

## 2014-04-05 NOTE — Progress Notes (Signed)
Patient saw Dr. Lacinda Axon today and was wanting to see Tyler Lester because he has a lot of issues going on.  A lot of pain, but Nettey has no appointment until March and he doesn't want to wait that long.  Please call him concerning this.  His phone number is [phone].

## 2014-04-06 ENCOUNTER — Telehealth: Payer: Self-pay | Admitting: Family Medicine

## 2014-04-06 NOTE — Telephone Encounter (Signed)
Patient would like to be escalated to Percocet. Patient has had an increase in his pain from his shoulders to hips, down his legs. Patient states he is unsure what has changed. States he has weakness and has pain and he has been falling recently. He has fallen multiple times, especially when trying to turn around. Patient is unsure if this is more related to pain or weakness. He has an appointment tomorrow with neurology. Advised him to keep that appointment and to make an appointment in our office to be seen since he is having frequent falls. Suspect his Parkinson disease is playing a role, but could also be related to sedation from opiates, benzo and gabapentin. Patient agreed to keep his appointment with neurology and to follow-up in [facility] clinic.

## 2014-04-06 NOTE — Progress Notes (Signed)
Spoke with patient about below message to get clarification on what exactly is needed. He is requesting Percocet with out having to come in. I explained to him that he would have to be evaluated. Patient still wants to be prescribed without coming in for visit. He would like Dr.Nettey to call him if this cannot be done.

## 2014-04-13 ENCOUNTER — Telehealth: Payer: Self-pay | Admitting: Family Medicine

## 2014-04-13 ENCOUNTER — Telehealth: Payer: Self-pay | Admitting: Clinical

## 2014-04-13 LAB — GLUCOSE, CAPILLARY: Glucose-Capillary: 115 mg/dL — ABNORMAL HIGH (ref 70–99)

## 2014-04-13 NOTE — Telephone Encounter (Signed)
Tyler Lester is requesting a rx for Viagra. Please call him if needing to discuss medication

## 2014-04-13 NOTE — Telephone Encounter (Signed)
CSW received a call from [place] with [place] that pt refused [facility] services. Kerri Perches stated that [facility] Lemmings is still attempting to reach pt to see if they can assist in any other way.  Hunt Oris, MSW, [place]

## 2014-04-19 ENCOUNTER — Other Ambulatory Visit: Payer: Self-pay | Admitting: *Deleted

## 2014-04-19 ENCOUNTER — Emergency Department (HOSPITAL_COMMUNITY)
Admission: EM | Admit: 2014-04-19 | Discharge: 2014-04-19 | Disposition: A | Discharge status: Home / Self Care | Payer: Medicare Other | Attending: Emergency Medicine | Admitting: Emergency Medicine

## 2014-04-19 ENCOUNTER — Encounter (HOSPITAL_COMMUNITY): Payer: Self-pay | Admitting: Nurse Practitioner

## 2014-04-19 DIAGNOSIS — E785 Hyperlipidemia, unspecified: Secondary | ICD-10-CM | POA: Diagnosis not present

## 2014-04-19 DIAGNOSIS — Z8719 Personal history of other diseases of the digestive system: Secondary | ICD-10-CM | POA: Insufficient documentation

## 2014-04-19 DIAGNOSIS — G8929 Other chronic pain: Secondary | ICD-10-CM | POA: Diagnosis not present

## 2014-04-19 DIAGNOSIS — E119 Type 2 diabetes mellitus without complications: Secondary | ICD-10-CM | POA: Diagnosis not present

## 2014-04-19 DIAGNOSIS — Z792 Long term (current) use of antibiotics: Secondary | ICD-10-CM | POA: Insufficient documentation

## 2014-04-19 DIAGNOSIS — Z8701 Personal history of pneumonia (recurrent): Secondary | ICD-10-CM | POA: Insufficient documentation

## 2014-04-19 DIAGNOSIS — N390 Urinary tract infection, site not specified: Secondary | ICD-10-CM | POA: Diagnosis not present

## 2014-04-19 DIAGNOSIS — I10 Essential (primary) hypertension: Secondary | ICD-10-CM | POA: Diagnosis not present

## 2014-04-19 DIAGNOSIS — Z466 Encounter for fitting and adjustment of urinary device: Secondary | ICD-10-CM | POA: Diagnosis present

## 2014-04-19 DIAGNOSIS — Z79899 Other long term (current) drug therapy: Secondary | ICD-10-CM | POA: Insufficient documentation

## 2014-04-19 DIAGNOSIS — Z8669 Personal history of other diseases of the nervous system and sense organs: Secondary | ICD-10-CM | POA: Insufficient documentation

## 2014-04-19 DIAGNOSIS — N400 Enlarged prostate without lower urinary tract symptoms: Secondary | ICD-10-CM | POA: Diagnosis not present

## 2014-04-19 LAB — URINALYSIS, ROUTINE W REFLEX MICROSCOPIC
Glucose, UA: NEGATIVE mg/dL
Ketones, ur: NEGATIVE mg/dL
Nitrite: POSITIVE — AB
Protein, ur: 100 mg/dL — AB
Specific Gravity, Urine: 1.021 (ref 1.005–1.030)
Urobilinogen, UA: 1 mg/dL (ref 0.0–1.0)
pH: 6 (ref 5.0–8.0)

## 2014-04-19 LAB — URINE MICROSCOPIC-ADD ON

## 2014-04-19 MED ORDER — CEPHALEXIN 500 MG PO CAPS: 500.0000 mg | ORAL_CAPSULE | Freq: Four times a day (QID) | ORAL | Status: DC

## 2014-04-19 NOTE — ED Notes (Signed)
Pt presents with a c/o of a Dislodged Foley Cath, states it is a chronic problem, and that the catheter was put in for his prostrate problem, denies pain or hematuria.

## 2014-04-19 NOTE — ED Notes (Signed)
Bed: RF16 Expected date: 04/19/14 Expected time: 7:16 PM Means of arrival: Ambulance Comments: Dislodged catheter

## 2014-04-19 NOTE — Discharge Instructions (Signed)
Catheter-Associated Urinary Tract Infection FAQs °WHAT IS "CATHETER-ASSOCIATED" URINARY TRACT INFECTION? °A urinary tract infection (also called "UTI") is an infection in the urinary system, which includes the bladder (which stores the urine) and the kidneys (which filter the blood to make urine). Germs (for example, bacteria or yeasts) do not normally live in these areas; but if germs are introduced, an infection can occur. If you have a urinary catheter, germs can travel along the catheter and cause an infection in your bladder or your kidney; in that case it is called a catheter-associated urinary tract infection (or "CA-UTI").  °WHAT IS A URINARY CATHETER? °A urinary catheter is a thin tube placed in the bladder to drain urine. Urine drains through the tube into a bag that collects the urine. A urinary  °catheter may be used: °· If you are not able to urinate on your own. °· To measure the amount of urine that you make, for example, during intensive care. °· During and after some types of surgery. °· During some tests of the kidneys and bladder . °People with urinary catheters have a much higher chance of getting a urinary tract infection than people who don't have a catheter. °HOW DO I GET A CATHETER-ASSOCIATED URINARY TRACT INFECTION (CA-UTI)? °If germs enter the urinary tract, they may cause an infection. Many of the germs that cause a catheter-associated urinary tract infection are common germs found in your intestines that do not usually cause an infection there. Germs can enter the urinary tract when the catheter is being put in or while the catheter remains in the bladder.  °WHAT ARE THE SYMPTOMS OF A URINARY TRACT INFECTION?  °Some of the common symptoms of a urinary tract infection are: °· Burning or pain in the lower abdomen (that is, below the stomach). °· Fever. °· Bloody urine may be a sign of infection, but is also caused by other problems . °· Burning during urination or an increase in the  frequency of urination after the catheter is removed. °Sometimes people with catheter-associated urinary tract infections do not have these symptoms of infection. °CAN CATHETER-ASSOCIATED URINARY TRACT INFECTIONS BE TREATED? °Yes, most catheter-associated urinary tract infections can be treated with antibiotics and removal or change of the catheter. Your doctor will determine which antibiotic is best for you.  °WHAT ARE SOME OF THE THINGS THAT HOSPITALS ARE DOING TO PREVENT CATHETER-ASSOCIATED URINARY TRACT INFECTIONS? °To prevent urinary tract infections, doctors and nurses take the following actions.  °Catheter insertion °· Catheters are put in only when necessary and they are removed as soon as possible. °· Only properly trained persons insert catheters using sterile ("clean") technique. °· The skin in the area where the catheter will be inserted is cleaned before inserting the catheter. °· Other methods to drain the urine are sometimes used, such as: °¨ External catheters in men (these look like condoms and are placed over the penis rather than into the penis) °¨ Putting a temporary catheter in to drain the urine and removing it right away. This is called intermittent urethral catheterization. °Catheter care °· Healthcare providers clean their hands by washing them with soap and water or using an alcohol-based hand rub before and after touching your catheter. °¨ If you do not see your providers clean their hands, please ask them to do so. °· Avoid disconnecting the catheter and drain tube. This helps to prevent germs from getting into the catheter tube. °· The catheter is secured to the leg to prevent pulling on the   catheter. °· Avoid twisting or kinking the catheter. °· Keep the bag lower than the bladder to prevent urine from backflowing to the bladder. °· Empty the bag regularly. The drainage spout should not touch anything while emptying the bag. °WHAT CAN I DO TO HELP PREVENT CATHETER-ASSOCIATED URINARY  TRACT INFECTIONS IF I HAVE A CATHETER? °· Always clean your hands before and after doing catheter care. °· Always keep your urine bag below the level of your bladder. °· Do not tug or pull on the tubing. °· Do not twist or kink the catheter tubing. °· Ask your healthcare provider each day if you still need the catheter. °WHAT DO I NEED TO DO WHEN I GO HOME FROM THE HOSPITAL? °· If you will be going home with a catheter, your doctor or nurse should explain everything you need to know about taking care of the catheter. Make sure you understand how to care for it before you leave the hospital. °· If you develop any of the symptoms of a urinary tract infection, such as burning or pain in the lower abdomen, fever, or an increase in the frequency of urination, contact your doctor or nurse immediately. °· Before you go home, make sure you know who to contact if you have questions or problems after you get home. °If you have questions, please ask your doctor or nurse. °Developed and co-sponsored by The Society for Healthcare Epidemiology of America (SHEA); Infectious Diseases Society of America (IDSA); The American Hospital Association; Association for Professionals in Infection Control and Epidemiology (APIC); Center for Disease Control (CDC); and The Joint Commission °Document Released: 11/07/2011 Document Reviewed: 11/07/2011 °ExitCare® Patient Information ©2015 [employer]. This information is not intended to replace advice given to you by your health care provider. Make sure you discuss any questions you have with your health care provider. ° °

## 2014-04-19 NOTE — ED Notes (Signed)
Pt requesting his foley catheter to be remove as"it is causing his discomfort," states he is now continent and does not need it, RN has obliged to pt's request, observed pt void without any difficulty and sent a UA sample to lab per protocol for urinary symptoms.

## 2014-04-19 NOTE — ED Notes (Signed)
Pt ambulates to the bathroom, states he needs to void, RN assess pt for the 2nd time that he was able to void, pt denies dysuria or hematuria.

## 2014-04-19 NOTE — ED Provider Notes (Signed)
CSN: [id number]     Arrival date & time 04/19/14  1936 History   First MD Initiated Contact with Patient 04/19/14 2139     Chief Complaint  Patient presents with  . Dislodged Foley Cath      (Consider location/radiation/quality/duration/timing/severity/associated sxs/prior Treatment) HPI   69 year old male presenting requesting his Foley catheter be removed.patient status post catheter for approximately 3 weeks. Initially presented with urinary retention secondary to enlarged prostate and concern for possible carcinoma. Patient would like to catheter out. I discussed with him the reason why he has it in place and concern for continued retention if removed. He would still like this done. Has had some issues with leakage around catheter. Discussed that we could exchange it. He simply wants it out. Denies pain. No hematuria. No fever. No n/v.   Past Medical History  Diagnosis Date  . Diabetes mellitus   . Chronic pain   . Hypertension   . Hyperlipidemia   . Sleep apnea   . Shortness of breath   . Inguinal hernia     right  . Pneumonia   . Numbness     T/O   Past Surgical History  Procedure Laterality Date  . Colon surgery    . Cataract extraction w/ intraocular lens implant Left 06/2012    laser for posterior capsule?  Marland Kitchen Tonsillectomy    . Posterior cervical fusion/foraminotomy N/A 11/12/2013    Procedure: Posterior cervical decompression fusion, cervical 3-4, cervical 4-5, cervical 5-6, cervical 6-7 with instrumentation and allograft   (LEVEL 4);  Surgeon: Sinclair Ship, MD;  Location: [place];  Service: Orthopedics;  Laterality: N/A;  Posterior cervical decompression fusion, cervical 3-4, cervical 4-5, cervical 5-6, cervical 6-7 with instrumentation and allograft   Family History  Problem Relation Age of Onset  . Cancer - Other Other   . Diabetes Other   . Hypertension Other    History  Substance Use Topics  . Smoking status: Never Smoker   . Smokeless tobacco: Never  Used  . Alcohol Use: Yes     Comment: rare alcohol /beer    Review of Systems  All systems reviewed and negative, other than as noted in HPI.   Allergies  Tramadol  Home Medications   Prior to Admission medications   Medication Sig Start Date End Date Taking? Authorizing Provider  atorvastatin (LIPITOR) 20 MG tablet Take 1 tablet (20 mg total) by mouth daily. 04/08/13  Yes Kandis Nab, MD  Elastic Bandages & Supports (LUMBAR BACK BRACE/SUPPORT PAD) MISC 1 Device by Does not apply route daily. 10/28/12  Yes Kandis Nab, MD  gabapentin (NEURONTIN) 300 MG capsule Take 1 capsule (300 mg total) by mouth 3 (three) times daily. May increase to 2 capsules (628m total) 3 times daily as needed. Patient taking differently: Take 300 mg by mouth 2 (two) times daily. May increase to 2 capsules (6059mtotal) 3 times daily as needed. 03/22/14  Yes AnMelvenia BeamMD  glimepiride (AMARYL) 4 MG tablet Take 1 tablet (4 mg total) by mouth daily. 04/08/13  Yes StKandis NabMD  glucose blood test strip Use as instructed 04/05/14  Yes JaCoral SpikesDO  HYDROcodone-acetaminophen (NORCO) 7.5-325 MG per tablet Take 1 tablet by mouth every 8 (eight) hours as needed for moderate pain. 03/03/14  Yes RaCordelia PocheMD  metFORMIN (GLUCOPHAGE) 1000 MG tablet Take 1 tablet (1,000 mg total) by mouth 2 (two) times daily with a meal. 08/24/13  Yes StBurnell Blanks  Losq, MD  Misc. Devices (CANE) MISC 1 Device by Does not apply route daily. Please fit one point cane for balance and support 10/28/12  Yes Kandis Nab, MD  rasagiline (AZILECT) 1 MG TABS tablet Take 1 tablet (1 mg total) by mouth daily. 03/22/14  Yes Melvenia Beam, MD  senna (SENOKOT) 8.6 MG TABS tablet Take 2 tablets (17.2 mg total) by mouth daily. Patient taking differently: Take 1 tablet by mouth daily.  02/05/14  Yes Cordelia Poche, MD  tamsulosin (FLOMAX) 0.4 MG CAPS capsule Take 1 capsule (0.4 mg total) by mouth daily after supper. 03/25/14  Yes Charlesetta Shanks, MD  Blood Glucose Monitoring Suppl (TRUERESULT BLOOD GLUCOSE) W/DEVICE KIT 1 kit by Does not apply route 3 (three) times daily. 03/26/14   Hannah Muthersbaugh, PA-C  cephALEXin (KEFLEX) 500 MG capsule Take 1 capsule (500 mg total) by mouth 4 (four) times daily. Patient not taking: Reported on 04/19/2014 03/26/14   Orpah Greek, MD  chlorpheniramine-HYDROcodone [facility] PENNKINETIC ER) 10-8 MG/5ML LQCR Take 5 mLs by mouth every 12 (twelve) hours as needed for cough. Patient not taking: Reported on 03/25/2014 02/28/14   Carmin Muskrat, MD  Cholecalciferol (VITAMIN D3) 400 UNITS tablet Take 2 tablets (800 Units total) by mouth daily. Patient not taking: Reported on 03/25/2014 03/10/12   Vivi Ferns, MD  ipratropium (ATROVENT) 0.06 % nasal spray Place 2 sprays into both nostrils 4 (four) times daily. Patient not taking: Reported on 03/25/2014 04/30/13   Kandis Nab, MD  lisinopril (PRINIVIL,ZESTRIL) 20 MG tablet Take 1 tablet (20 mg total) by mouth daily. Patient not taking: Reported on 04/19/2014 03/03/14   Cordelia Poche, MD  omeprazole (PRILOSEC) 20 MG capsule Take 1 capsule (20 mg total) by mouth daily. Patient not taking: Reported on 04/19/2014 08/24/13   Kandis Nab, MD  terbinafine (LAMISIL) 250 MG tablet Take 1 tablet (250 mg total) by mouth daily. Patient not taking: Reported on 03/25/2014 04/09/13   Kandis Nab, MD  TRUEPLUS LANCETS 28G MISC 1 Device by Does not apply route 3 (three) times daily. 03/26/14   Hannah Muthersbaugh, PA-C   BP 134/80 mmHg  Pulse 84  Temp(Src) 97.9 F (36.6 C) (Oral)  Resp 14  SpO2 97% Physical Exam  Constitutional: He appears well-developed and well-nourished. No distress.  HENT:  Head: Normocephalic and atraumatic.  Eyes: Conjunctivae are normal. Right eye exhibits no discharge. Left eye exhibits no discharge.  Neck: Neck supple.  Cardiovascular: Normal rate, regular rhythm and normal heart sounds.  Exam reveals no gallop and no  friction rub.   No murmur heard. Pulmonary/Chest: Effort normal and breath sounds normal. No respiratory distress.  Abdominal: Soft. He exhibits no distension. There is no tenderness.  Genitourinary:  No cva tenderness  Musculoskeletal: He exhibits no edema or tenderness.  Neurological: He is alert.  Skin: Skin is warm and dry.  Psychiatric: He has a normal mood and affect. His behavior is normal. Thought content normal.  Nursing note and vitals reviewed.   ED Course  Procedures (including critical care time) Labs Review Labs Reviewed  URINALYSIS, ROUTINE W REFLEX MICROSCOPIC - Abnormal; Notable for the following:    Color, Urine AMBER (*)    APPearance CLOUDY (*)    Hgb urine dipstick LARGE (*)    Bilirubin Urine SMALL (*)    Protein, ur 100 (*)    Nitrite POSITIVE (*)    Leukocytes, UA LARGE (*)    All other components within normal  limits  URINE MICROSCOPIC-ADD ON - Abnormal; Notable for the following:    Squamous Epithelial / LPF FEW (*)    Bacteria, UA MANY (*)    All other components within normal limits  URINE CULTURE    Imaging Review No results found.   EKG Interpretation None      MDM   Final diagnoses:  UTI (lower urinary tract infection)    69 year old male presenting requesting his Foley catheter be removed. Place secondary to retention. Was removed at his request. He states that he is been able to void since removal. Urinalysis is consistent with infection. Will be placed on antibiotics. Return precautions were discussed.    Virgel Manifold, MD 04/28/14 [phone]

## 2014-04-20 ENCOUNTER — Emergency Department (HOSPITAL_COMMUNITY)
Admission: EM | Admit: 2014-04-20 | Discharge: 2014-04-20 | Disposition: A | Discharge status: Home / Self Care | Payer: Medicare Other | Attending: Emergency Medicine | Admitting: Emergency Medicine

## 2014-04-20 ENCOUNTER — Ambulatory Visit: Payer: Medicare Other | Admitting: Family Medicine

## 2014-04-20 ENCOUNTER — Encounter (HOSPITAL_COMMUNITY): Payer: Self-pay

## 2014-04-20 DIAGNOSIS — Z792 Long term (current) use of antibiotics: Secondary | ICD-10-CM | POA: Insufficient documentation

## 2014-04-20 DIAGNOSIS — E785 Hyperlipidemia, unspecified: Secondary | ICD-10-CM | POA: Insufficient documentation

## 2014-04-20 DIAGNOSIS — R339 Retention of urine, unspecified: Secondary | ICD-10-CM | POA: Diagnosis not present

## 2014-04-20 DIAGNOSIS — Z8701 Personal history of pneumonia (recurrent): Secondary | ICD-10-CM | POA: Insufficient documentation

## 2014-04-20 DIAGNOSIS — Z79899 Other long term (current) drug therapy: Secondary | ICD-10-CM | POA: Insufficient documentation

## 2014-04-20 DIAGNOSIS — Z8719 Personal history of other diseases of the digestive system: Secondary | ICD-10-CM | POA: Insufficient documentation

## 2014-04-20 DIAGNOSIS — G8929 Other chronic pain: Secondary | ICD-10-CM | POA: Diagnosis not present

## 2014-04-20 DIAGNOSIS — E119 Type 2 diabetes mellitus without complications: Secondary | ICD-10-CM | POA: Insufficient documentation

## 2014-04-20 DIAGNOSIS — Z8669 Personal history of other diseases of the nervous system and sense organs: Secondary | ICD-10-CM | POA: Diagnosis not present

## 2014-04-20 DIAGNOSIS — I10 Essential (primary) hypertension: Secondary | ICD-10-CM | POA: Insufficient documentation

## 2014-04-20 LAB — CBG MONITORING, ED: Glucose-Capillary: 114 mg/dL — ABNORMAL HIGH (ref 70–99)

## 2014-04-20 NOTE — Discharge Instructions (Signed)
Follow up tomorrow with your md as planned 

## 2014-04-20 NOTE — ED Notes (Signed)
Bed: WA10 Expected date:  Expected time:  Means of arrival:  Comments: ems 

## 2014-04-20 NOTE — ED Notes (Signed)
Per EMS, pt from home.  Pt had prostate removed x 4 years ago.  Pt had cath placed for retention x 4 weeks ago.  Started on antibiotic for UTI.  Pt cath was removed yesterday.  Pt has not voided since 2 pm yesterday.  Alert and oriented.  Vitals: 142/63, hr 120, resp 20, oxygen 94%

## 2014-04-20 NOTE — ED Provider Notes (Signed)
CSN: [id number]     Arrival date & time 04/20/14  9628 History   First MD Initiated Contact with Patient 04/20/14 [id number]     Chief Complaint  Patient presents with  . Urinary Retention     (Consider location/radiation/quality/duration/timing/severity/associated sxs/prior Treatment) Patient is a 69 y.o. male presenting with abdominal pain. The history is provided by the patient (the pt had a foley removed last night and cannot urinate now.).  Abdominal Pain Pain location:  Suprapubic Pain quality: aching   Pain radiates to:  Does not radiate Pain severity:  Moderate Onset quality:  Gradual Timing:  Constant Progression:  Worsening Chronicity:  Recurrent Context: not alcohol use   Associated symptoms: no chest pain, no cough, no diarrhea, no fatigue and no hematuria     Past Medical History  Diagnosis Date  . Diabetes mellitus   . Chronic pain   . Hypertension   . Hyperlipidemia   . Sleep apnea   . Shortness of breath   . Inguinal hernia     right  . Pneumonia   . Numbness     T/O   Past Surgical History  Procedure Laterality Date  . Colon surgery    . Cataract extraction w/ intraocular lens implant Left 06/2012    laser for posterior capsule?  Marland Kitchen Tonsillectomy    . Posterior cervical fusion/foraminotomy N/A 11/12/2013    Procedure: Posterior cervical decompression fusion, cervical 3-4, cervical 4-5, cervical 5-6, cervical 6-7 with instrumentation and allograft   (LEVEL 4);  Surgeon: Sinclair Ship, MD;  Location: [place];  Service: Orthopedics;  Laterality: N/A;  Posterior cervical decompression fusion, cervical 3-4, cervical 4-5, cervical 5-6, cervical 6-7 with instrumentation and allograft   Family History  Problem Relation Age of Onset  . Cancer - Other Other   . Diabetes Other   . Hypertension Other    History  Substance Use Topics  . Smoking status: Never Smoker   . Smokeless tobacco: Never Used  . Alcohol Use: Yes     Comment: rare alcohol /beer     Review of Systems  Constitutional: Negative for appetite change and fatigue.  HENT: Negative for congestion, ear discharge and sinus pressure.   Eyes: Negative for discharge.  Respiratory: Negative for cough.   Cardiovascular: Negative for chest pain.  Gastrointestinal: Positive for abdominal pain. Negative for diarrhea.  Genitourinary: Negative for frequency and hematuria.  Musculoskeletal: Negative for back pain.  Skin: Negative for rash.  Neurological: Negative for seizures and headaches.  Psychiatric/Behavioral: Negative for hallucinations.      Allergies  Tramadol  Home Medications   Prior to Admission medications   Medication Sig Start Date End Date Taking? Authorizing Provider  atorvastatin (LIPITOR) 20 MG tablet Take 1 tablet (20 mg total) by mouth daily. 04/08/13  Yes Kandis Nab, MD  Blood Glucose Monitoring Suppl (TRUERESULT BLOOD GLUCOSE) W/DEVICE KIT 1 kit by Does not apply route 3 (three) times daily. 03/26/14  Yes Hannah Muthersbaugh, PA-C  Elastic Bandages & Supports (LUMBAR BACK BRACE/SUPPORT PAD) MISC 1 Device by Does not apply route daily. 10/28/12  Yes Kandis Nab, MD  gabapentin (NEURONTIN) 300 MG capsule Take 1 capsule (300 mg total) by mouth 3 (three) times daily. May increase to 2 capsules (65m total) 3 times daily as needed. Patient taking differently: Take 300 mg by mouth 2 (two) times daily. May increase to 2 capsules (602mtotal) 3 times daily as needed. 03/22/14  Yes AnMelvenia BeamMD  glimepiride (AJari Sportsman  4 MG tablet Take 1 tablet (4 mg total) by mouth daily. 04/08/13  Yes Kandis Nab, MD  glucose blood test strip Use as instructed 04/05/14  Yes Coral Spikes, DO  HYDROcodone-acetaminophen (NORCO) 7.5-325 MG per tablet Take 1 tablet by mouth every 8 (eight) hours as needed for moderate pain. 03/03/14  Yes Cordelia Poche, MD  metFORMIN (GLUCOPHAGE) 1000 MG tablet Take 1 tablet (1,000 mg total) by mouth 2 (two) times daily with a meal. 08/24/13   Yes Kandis Nab, MD  Misc. Devices (CANE) MISC 1 Device by Does not apply route daily. Please fit one point cane for balance and support 10/28/12  Yes Kandis Nab, MD  rasagiline (AZILECT) 1 MG TABS tablet Take 1 tablet (1 mg total) by mouth daily. 03/22/14  Yes Melvenia Beam, MD  senna (SENOKOT) 8.6 MG TABS tablet Take 2 tablets (17.2 mg total) by mouth daily. Patient taking differently: Take 1 tablet by mouth daily.  02/05/14  Yes Cordelia Poche, MD  tamsulosin (FLOMAX) 0.4 MG CAPS capsule Take 1 capsule (0.4 mg total) by mouth daily after supper. 03/25/14  Yes Charlesetta Shanks, MD  TRUEPLUS LANCETS 28G MISC 1 Device by Does not apply route 3 (three) times daily. 03/26/14  Yes Hannah Muthersbaugh, PA-C  cephALEXin (KEFLEX) 500 MG capsule Take 1 capsule (500 mg total) by mouth 4 (four) times daily. Patient not taking: Reported on 04/19/2014 03/26/14   Orpah Greek, MD  cephALEXin (KEFLEX) 500 MG capsule Take 1 capsule (500 mg total) by mouth 4 (four) times daily. Patient not taking: Reported on 04/20/2014 04/19/14   Virgel Manifold, MD  chlorpheniramine-HYDROcodone [facility] ER) 10-8 MG/5ML [facility] Take 5 mLs by mouth every 12 (twelve) hours as needed for cough. Patient not taking: Reported on 03/25/2014 02/28/14   Carmin Muskrat, MD  Cholecalciferol (VITAMIN D3) 400 UNITS tablet Take 2 tablets (800 Units total) by mouth daily. Patient not taking: Reported on 03/25/2014 03/10/12   Vivi Ferns, MD  ipratropium (ATROVENT) 0.06 % nasal spray Place 2 sprays into both nostrils 4 (four) times daily. Patient not taking: Reported on 03/25/2014 04/30/13   Kandis Nab, MD  lisinopril (PRINIVIL,ZESTRIL) 20 MG tablet Take 1 tablet (20 mg total) by mouth daily. Patient not taking: Reported on 04/19/2014 03/03/14   Cordelia Poche, MD  omeprazole (PRILOSEC) 20 MG capsule Take 1 capsule (20 mg total) by mouth daily. Patient not taking: Reported on 04/19/2014 08/24/13   Kandis Nab, MD   terbinafine (LAMISIL) 250 MG tablet Take 1 tablet (250 mg total) by mouth daily. Patient not taking: Reported on 03/25/2014 04/09/13   Kandis Nab, MD   BP 188/86 mmHg  Pulse 112  Temp(Src) 99.2 F (37.3 C) (Oral)  Resp 18  SpO2 99% Physical Exam  Constitutional: He is oriented to person, place, and time. He appears well-developed.  HENT:  Head: Normocephalic.  Eyes: Conjunctivae and EOM are normal. No scleral icterus.  Neck: Neck supple. No thyromegaly present.  Cardiovascular: Normal rate and regular rhythm.  Exam reveals no gallop and no friction rub.   No murmur heard. Pulmonary/Chest: No stridor. He has no wheezes. He has no rales. He exhibits no tenderness.  Abdominal: He exhibits no distension. There is tenderness. There is no rebound.  Mild tenderness to abd with distension  Musculoskeletal: Normal range of motion. He exhibits no edema.  Lymphadenopathy:    He has no cervical adenopathy.  Neurological: He is oriented to person, place, and  time. He exhibits normal muscle tone. Coordination normal.  Skin: No rash noted. No erythema.  Psychiatric: He has a normal mood and affect. His behavior is normal.    ED Course  Procedures (including critical care time) Labs Review Labs Reviewed  CBG MONITORING, ED - Abnormal; Notable for the following:    Glucose-Capillary 114 (*)    All other components within normal limits    Imaging Review No results found.   EKG Interpretation None      MDM   Final diagnoses:  Urinary retention    Urinary retention after removal of foley last night.   Pt to follow up tomorrow    Maudry Diego, MD 04/20/14 1019

## 2014-04-21 NOTE — Telephone Encounter (Signed)
Pt called to check the status of his pain medication. Please call when ready to pick up . jw

## 2014-04-22 ENCOUNTER — Other Ambulatory Visit (HOSPITAL_COMMUNITY): Payer: Self-pay

## 2014-04-22 LAB — URINE CULTURE: Colony Count: >=100000

## 2014-04-22 NOTE — ED Notes (Signed)
Post ED Visit - Positive Culture Follow-up: Successful Patient Follow-Up  Culture assessed and recommendations reviewed by: []  Wes [place], Pharm.D., BCPS [x]  Heide Guile, Pharm.D., BCPS []  Alycia Rossetti, Pharm.D., BCPS []  [place], Pharm.D., BCPS, AAHIVP []  Legrand Como, Pharm.D., BCPS, AAHIVP []  Hassie Bruce, Pharm.D. []  Milus Glazier, [place].D.  Positive urine culture  []  Patient discharged without antimicrobial prescription and treatment is now indicated [x]  Organism is resistant to prescribed ED discharge antimicrobial []  Patient with positive blood cultures  Changes discussed with ED provider: Quincy Carnes [facility] New antibiotic prescription Cipro 500mg  po bid x 7 days, no refills Called to [place]  Contacted patient, date 04/22/2014, time 1047   Ileene Musa 04/22/2014, 10:46 AM

## 2014-04-27 ENCOUNTER — Ambulatory Visit (INDEPENDENT_AMBULATORY_CARE_PROVIDER_SITE_OTHER): Payer: Medicare Other | Admitting: Family Medicine

## 2014-04-27 ENCOUNTER — Encounter: Payer: Self-pay | Admitting: Family Medicine

## 2014-04-27 VITALS — BP 115/52 | HR 91 | Ht 69.0 in | Wt 187.0 lb

## 2014-04-27 DIAGNOSIS — H9190 Unspecified hearing loss, unspecified ear: Secondary | ICD-10-CM

## 2014-04-27 DIAGNOSIS — H9193 Unspecified hearing loss, bilateral: Secondary | ICD-10-CM

## 2014-04-27 DIAGNOSIS — E1149 Type 2 diabetes mellitus with other diabetic neurological complication: Secondary | ICD-10-CM

## 2014-04-27 DIAGNOSIS — G8929 Other chronic pain: Secondary | ICD-10-CM

## 2014-04-27 DIAGNOSIS — E1140 Type 2 diabetes mellitus with diabetic neuropathy, unspecified: Secondary | ICD-10-CM

## 2014-04-27 DIAGNOSIS — I10 Essential (primary) hypertension: Secondary | ICD-10-CM

## 2014-04-27 DIAGNOSIS — H6122 Impacted cerumen, left ear: Secondary | ICD-10-CM | POA: Insufficient documentation

## 2014-04-27 MED ORDER — HYDROCODONE-ACETAMINOPHEN 7.5-325 MG PO TABS: 1.0000 | ORAL_TABLET | Freq: Three times a day (TID) | ORAL | Status: DC | PRN

## 2014-04-27 MED ORDER — LISINOPRIL 2.5 MG PO TABS: 2.5000 mg | ORAL_TABLET | Freq: Every day | ORAL | Status: DC

## 2014-04-27 NOTE — Assessment & Plan Note (Addendum)
Blood pressure controlled and slightly hypotensive. No symptoms. Patient reports taking HCTZ  Continue HCTZ 25mg  daily  Reduce dose of lisinopril to 2.5mg  daily as patient has not been taking this.  Plan to increase lisinopril and decrease HCTZ as BP tolerates

## 2014-04-27 NOTE — Patient Instructions (Addendum)
Thank you for coming to see me today. It was a pleasure. Today we talked about:   Chronic pain: I am refilling your Vicodin  Hearing loss: I am referring you to audiology for hearing aids  Diabetes: Your last A1C was 5.3. I will discontinue your glipizide. Please continue taking metformin. Also, please return promptly with your meter so we can figure out how to facilitate you checking your blood sugar in the morning.  High blood pressure: I am reducing your lisinopril to 2.5mg  daily. Since you have been taking hydrochlorothiazide, you can continue taking that medication  Please make an appointment to see me in 4 weeks for follow-up.  If you have any questions or concerns, please do not hesitate to call the office at [phone].  Sincerely,  Cordelia Poche, MD

## 2014-04-27 NOTE — Assessment & Plan Note (Addendum)
Patient states still taking glipizide.   Instructed to discontinue glipizide  Patient to return with glucose monitor so he can get test strips  Continue metformin

## 2014-04-27 NOTE — Assessment & Plan Note (Signed)
Refer to audiology.

## 2014-04-27 NOTE — Progress Notes (Signed)
    Subjective    Tyler Lester is a 69 y.o. male that presents for an office visit.   1. Hearing loss: chronic for years. It has been getting worse. He cannot hear well on the phone and requires people to repeat what they are saying. He would like to see audiology to get a hearing aid.  2. Chronic pain: Pain controlled with Norco. He ran out of medication yesterday. He has bowel movements every few days. They are soft and not difficulty.   3. Diabetes: He has been taking his metformin and glipizide. He is not checking his blood sugar because he does not have strips for his meter. He is unclear about which meter he has. No hypoglycemia  4. Hypertension: Patient does not take lisinopril but he has been taking HCTZ. No lightheadedness, chest pain or shortness of breath.  History  Substance Use Topics  . Smoking status: Never Smoker   . Smokeless tobacco: Never Used  . Alcohol Use: Yes     Comment: rare alcohol /beer    Allergies  Allergen Reactions  . Tramadol Itching    No orders of the defined types were placed in this encounter.    ROS  Per HPI  Objective   BP 115/52 mmHg  Pulse 91  Ht 5\' 9"  (1.753 m)  Wt 187 lb (84.823 kg)  BMI 27.60 kg/m2  General: Well appearing Musculoskeletal: Foot exam performed and documented  Assessment and Plan   Please refer to problem based charting of assessment and plan

## 2014-04-27 NOTE — Assessment & Plan Note (Addendum)
Pain manageable with current regimen. Refill Norco

## 2014-04-29 NOTE — Telephone Encounter (Signed)
Pt's insurance company called and pt needs a new glucometer and strips. His insurance will cover Accu-check aviva plus, Accu -check Nano easy read, One touch Ultra 2, One touch ultra Vero and strips to go with the meter. Please call one one of these for the patient to also include strips. jw

## 2014-05-03 ENCOUNTER — Telehealth: Payer: Self-pay | Admitting: Family Medicine

## 2014-05-03 MED ORDER — ONETOUCH ULTRASOFT LANCETS MISC: Status: DC

## 2014-05-03 MED ORDER — GLUCOSE BLOOD VI STRP: ORAL_STRIP | Status: DC

## 2014-05-03 MED ORDER — ONETOUCH ULTRA 2 W/DEVICE KIT: PACK | Status: DC

## 2014-05-03 NOTE — Telephone Encounter (Signed)
OneTouch meter, test strips and lancets placed for fax to pharmacy.

## 2014-05-03 NOTE — Telephone Encounter (Signed)
Pt called to check on the status of his referral to have a colonoscopy and also that his pain medication Hydrocodone was suppose to be 10.5 instead of the 7.5. jw

## 2014-05-03 NOTE — Telephone Encounter (Signed)
I have always prescribed hydrocodone 7.5mg . There was not a mistake. Regarding the colonoscopy, a referral was placed in January. I am unsure of the status of the referral.

## 2014-05-04 ENCOUNTER — Other Ambulatory Visit: Payer: Self-pay | Admitting: Family Medicine

## 2014-05-04 ENCOUNTER — Telehealth: Payer: Self-pay | Admitting: Family Medicine

## 2014-05-04 NOTE — Telephone Encounter (Signed)
Spoke with patient and informed him about the below message. He states that he does now understand the hydrocodone issue. Inform him referral looks like it has been placed

## 2014-05-04 NOTE — Telephone Encounter (Signed)
Please also include his address of [place] in the letter. jw

## 2014-05-04 NOTE — Telephone Encounter (Signed)
Pt called because he needs [place] to write a letter to the [facility] stating that he has medical issues so that they help him pay his light bill. Please call patient to get additional information and let him know when he can pick this up. jw

## 2014-05-04 NOTE — Telephone Encounter (Signed)
Spoke with patient and informed him that rx's have been sent in to [facility]

## 2014-05-04 NOTE — Telephone Encounter (Signed)
Ext [id number] Would like to have lancets, test strips and glucometer sent to [place]

## 2014-05-05 ENCOUNTER — Ambulatory Visit (INDEPENDENT_AMBULATORY_CARE_PROVIDER_SITE_OTHER): Payer: Medicare Other | Admitting: Podiatry

## 2014-05-05 ENCOUNTER — Ambulatory Visit (INDEPENDENT_AMBULATORY_CARE_PROVIDER_SITE_OTHER): Payer: Medicare Other

## 2014-05-05 ENCOUNTER — Encounter: Payer: Self-pay | Admitting: Podiatry

## 2014-05-05 ENCOUNTER — Telehealth: Payer: Self-pay | Admitting: Family Medicine

## 2014-05-05 VITALS — BP 122/76 | HR 94 | Resp 18

## 2014-05-05 DIAGNOSIS — M25579 Pain in unspecified ankle and joints of unspecified foot: Secondary | ICD-10-CM | POA: Diagnosis not present

## 2014-05-05 DIAGNOSIS — E1149 Type 2 diabetes mellitus with other diabetic neurological complication: Secondary | ICD-10-CM

## 2014-05-05 DIAGNOSIS — R52 Pain, unspecified: Secondary | ICD-10-CM

## 2014-05-05 DIAGNOSIS — E1140 Type 2 diabetes mellitus with diabetic neuropathy, unspecified: Secondary | ICD-10-CM | POA: Diagnosis not present

## 2014-05-05 NOTE — Telephone Encounter (Signed)
Has ear problems Needs a referral to ear dr

## 2014-05-05 NOTE — Telephone Encounter (Signed)
What exactly his crisis is. He needs for the letter to state that he has had multiple ambulance ruides to hospital. Patient state that it has bee 3 in last month. He wanted [place] to write stating also he has ambulance bills not paid. I informed him that part is something that hospital billing will need to take care of.

## 2014-05-05 NOTE — Telephone Encounter (Signed)
Spoke with pt and informed him that his referral had been placed and it has been put in OP-Audiology workqueue. Katharina Caper, [name]

## 2014-05-06 ENCOUNTER — Encounter: Payer: Self-pay | Admitting: Podiatry

## 2014-05-06 NOTE — Progress Notes (Signed)
Patient ID: Tyler Lester, male   DOB: 1945/07/13, 69 y.o.   MRN: [id number]  Subjective: 69 year old male returns the office today with complaints of right ankle pain. He states he is unable to walk due to pain in his right ankle. He also states he has pain in his right leg and he has cramping a pain in his leg. He denies any history of injury or trauma to the ankle or foot. He states his been ongoing for 6 months or more. He states that he has pain with pressure and ambulation. States that he gets intermittent swelling to bilateral legs. No other complaints at this time.  Objective: AAO 3, NAD DP/PT pulses decreased bilaterally. There is atrophy of the skin. No pedal hair present. Decreased protective sensation with Sims once the monofilament, vibratory sensation intact, Achilles tendon reflex intact. There is tenderness to palpation overlying the lateral aspect of the right ankle overlying the fibula. There is mild discomfort overlying the lateral ankle ligaments particularly the ATFL. There is no pain on the deltoid ligaments, syndesmosis or medial ankle. There is no areas of tenderness of the foot. No tenderness at this time to the contralateral extremity. There is mild chronic appearing bilateral lower extremity without any associated erythema or increase in warmth. There is no pain with ankle joint range of motion or subtalar joint range of motion. MMT 4/5 No open lesions or pre-ulcerative lesions identified bilaterally. No pain with calf compression, warmth, erythema.  Assessment: 69 year old male with continued right ankle pain.  Plan: -X-rays were obtained and reviewed. -Treatment options discussed including alternatives, risks, complications. -As the patient has tried multiple different treatments due to the ankle pain he is unable to bear weight at this time to the ankle discussed various options. At this time will immobilize short-term in a Cam Walker to help take pressure off the  area. -He previously had vascular studies performed last year. Given findings of right leg pain with ambulation, decreased pulses, atrophy of the skin I do believe that he should be formally evaluated to assess his circulation. A referral was placed.  Bilateral ABIs within normal limits at rest. Duplex imaging of the right lower extremity: Minimal to mild plaque thoughout. There is a 50% stenosis in the proximal femoral artery. Waveforms triphasic in the common femoral artery and biphasic throughout the femoral and popliteal arteries. -Follow-up in 2 weeks, or sooner should any problems arise. In the meantime, encouraged to call the office with any questions/concerns/change in symptoms.

## 2014-05-06 NOTE — Telephone Encounter (Signed)
Prescriptions placed for fax to [facility]

## 2014-05-06 NOTE — Telephone Encounter (Signed)
patient informed

## 2014-05-11 ENCOUNTER — Encounter: Payer: Self-pay | Admitting: Family Medicine

## 2014-05-11 NOTE — Progress Notes (Signed)
Spoke with Caron Presume and gave requested information below

## 2014-05-11 NOTE — Progress Notes (Signed)
Caron Presume, nurse from [employer], left message on medical records line that she needs the latest bp and A1C.  Her number is [phone] ext [phone] and she said you can leave it on her voicemail.

## 2014-05-12 ENCOUNTER — Other Ambulatory Visit: Payer: Self-pay | Admitting: *Deleted

## 2014-05-12 ENCOUNTER — Ambulatory Visit: Payer: Medicare Other | Admitting: Podiatry

## 2014-05-14 ENCOUNTER — Encounter: Payer: Self-pay | Admitting: Cardiovascular Disease

## 2014-05-14 ENCOUNTER — Ambulatory Visit (INDEPENDENT_AMBULATORY_CARE_PROVIDER_SITE_OTHER): Payer: Medicare Other | Admitting: Cardiovascular Disease

## 2014-05-14 VITALS — BP 124/76 | HR 86 | Ht 69.0 in | Wt 188.4 lb

## 2014-05-14 DIAGNOSIS — M79671 Pain in right foot: Secondary | ICD-10-CM | POA: Diagnosis not present

## 2014-05-14 DIAGNOSIS — I739 Peripheral vascular disease, unspecified: Secondary | ICD-10-CM | POA: Diagnosis not present

## 2014-05-14 DIAGNOSIS — M79604 Pain in right leg: Secondary | ICD-10-CM | POA: Diagnosis not present

## 2014-05-14 DIAGNOSIS — M79605 Pain in left leg: Secondary | ICD-10-CM

## 2014-05-14 DIAGNOSIS — M79672 Pain in left foot: Secondary | ICD-10-CM | POA: Diagnosis not present

## 2014-05-14 NOTE — Patient Instructions (Addendum)
Follow up with Dr Gwenlyn Found as needed.   Dr Gwenlyn Found has ordered a lower extremity arterial doppler- During this test, ultrasound is used to evaluate arterial blood flow in the legs. Allow approximately one hour for this exam.   Dr Gwenlyn Found has referred you to [facility] for evaluation of your leg pain.

## 2014-05-14 NOTE — Progress Notes (Signed)
05/14/2014 Tyler Lester   1945/12/14  [id number]  Primary Physician Cordelia Poche, MD Primary Cardiologist: Lorretta Harp MD Renae Gloss   HPI:  Tyler Lester is a 69 year old single African-American male father of 2, grandfather of 3 grandchildren was accompanied by his caregiver today. He lives alone and was referred by Dr. Jacqualyn Posey, podiatrist at [facility] , for evaluation of peripheral arterial disease. He has a history of treated hypertension, diabetes and hyperlipidemia. He has never had a heart attack or stroke and denies chest pain or shortness of breath. He was walking without difficulty up until 3-4 months ago and now walks with the aid of a cane because of pain in his feet and calves. He did have arterial Doppler studies performed at [facility] in November last year which was were read as essentially normal triphasic waveforms.    Current Outpatient Prescriptions  Medication Sig Dispense Refill  . atorvastatin (LIPITOR) 20 MG tablet Take 1 tablet (20 mg total) by mouth daily. 90 tablet 3  . Cholecalciferol (VITAMIN D3) 400 UNITS tablet Take 2 tablets (800 Units total) by mouth daily. 60 tablet 1  . gabapentin (NEURONTIN) 300 MG capsule Take 1 capsule (300 mg total) by mouth 3 (three) times daily. May increase to 2 capsules (600mg  total) 3 times daily as needed. (Patient taking differently: Take 300 mg by mouth 2 (two) times daily. May increase to 2 capsules (600mg  total) 3 times daily as needed.) 180 capsule 11  . hydrochlorothiazide (HYDRODIURIL) 25 MG tablet Take 25 mg by mouth daily.    Marland Kitchen HYDROcodone-acetaminophen (NORCO) 7.5-325 MG per tablet Take 1 tablet by mouth every 8 (eight) hours as needed for moderate pain. 90 tablet 0  . ipratropium (ATROVENT) 0.06 % nasal spray Place 2 sprays into both nostrils 4 (four) times daily. 15 mL 12  . lisinopril (PRINIVIL,ZESTRIL) 2.5 MG tablet Take 1 tablet (2.5 mg total) by mouth daily. 30 tablet 2  . metFORMIN  (GLUCOPHAGE) 1000 MG tablet Take 1 tablet (1,000 mg total) by mouth 2 (two) times daily with a meal. 180 tablet 6  . omeprazole (PRILOSEC) 20 MG capsule Take 1 capsule (20 mg total) by mouth daily. 30 capsule 3  . rasagiline (AZILECT) 1 MG TABS tablet Take 1 tablet (1 mg total) by mouth daily. 90 tablet 6  . senna (SENOKOT) 8.6 MG TABS tablet Take 2 tablets (17.2 mg total) by mouth daily. (Patient taking differently: Take 1 tablet by mouth daily. ) 120 each 5  . tamsulosin (FLOMAX) 0.4 MG CAPS capsule Take 1 capsule (0.4 mg total) by mouth daily after supper. 30 capsule 0  . terbinafine (LAMISIL) 250 MG tablet Take 1 tablet (250 mg total) by mouth daily. 45 tablet 0   No current facility-administered medications for this visit.    Allergies  Allergen Reactions  . Poison Ivy Treatments Hives    History   Social History  . Marital Status: Single    Spouse Name: N/A  . Number of Children: 2  . Years of Education: 12   Occupational History  . Retired    . Disability    Social History Main Topics  . Smoking status: Never Smoker   . Smokeless tobacco: Never Used  . Alcohol Use: Yes     Comment: rare alcohol /beer  . Drug Use: No  . Sexual Activity: Not on file   Other Topics Concern  . Not on file   Social History Narrative   Patient  lives at home alone.    Patient have 2 children.    Patient has a high school education    Patient is right handed.    Patient is retired on disability.         Review of Systems: General: negative for chills, fever, night sweats or weight changes.  Cardiovascular: negative for chest pain, dyspnea on exertion, edema, orthopnea, palpitations, paroxysmal nocturnal dyspnea or shortness of breath Dermatological: negative for rash Respiratory: negative for cough or wheezing Urologic: negative for hematuria Abdominal: negative for nausea, vomiting, diarrhea, bright red blood per rectum, melena, or hematemesis Neurologic: negative for visual  changes, syncope, or dizziness All other systems reviewed and are otherwise negative except as noted above.    Blood pressure 124/76, pulse 86, height 5\' 9"  (1.753 m), weight 188 lb 6.4 oz (85.458 kg).  General appearance: alert and no distress Neck: no adenopathy, no carotid bruit, no JVD, supple, symmetrical, trachea midline and thyroid not enlarged, symmetric, no tenderness/mass/nodules Lungs: clear to auscultation bilaterally Heart: regular rate and rhythm, S1, S2 normal, no murmur, click, rub or gallop Extremities: 2+ pedal pulses  EKG not performed today  ASSESSMENT AND PLAN:   Bilateral foot pain Tyler Lester was referred by Dr. Jacqualyn Posey, podiatrist at [facility], for evaluation of bilateral foot and leg pain. He relates that over the last 3-4 months she's had pain in his feet and calves with ambulating. He does have positive risk factors for vascular disease including hypertension time, diabetes and hyperlipidemia. He did have lower extremity arterial  Dopplers performed at [facility] 01/15/14 that were essentially normal at rest with triphasic waveforms. On exam he does have 2+ pedal pulses. 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       Lorretta Harp MD FACP,FACC,FAHA, [name] 05/14/2014 10:43 AM

## 2014-05-14 NOTE — Assessment & Plan Note (Addendum)
Mr. Paolillo was referred by Dr. Jacqualyn Posey, podiatrist at [facility], for evaluation of bilateral foot and leg pain. He relates that over the last 3-4 months she's had pain in his feet and calves with ambulating. He does have positive risk factors for vascular disease including hypertension time, diabetes and hyperlipidemia. He did have lower extremity arterial  Dopplers performed at [facility] 01/15/14 that were essentially normal at rest with triphasic waveforms. On exam he does have 2+ pedal pulses. 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

## 2014-05-18 ENCOUNTER — Ambulatory Visit: Payer: Medicare Other | Admitting: Cardiovascular Disease

## 2014-05-19 ENCOUNTER — Ambulatory Visit: Payer: Medicare Other | Admitting: Podiatry

## 2014-05-19 ENCOUNTER — Encounter: Payer: Self-pay | Admitting: Physical Medicine & Rehabilitation

## 2014-05-24 ENCOUNTER — Ambulatory Visit (HOSPITAL_COMMUNITY)
Admission: RE | Admit: 2014-05-24 | Discharge: 2014-05-24 | Disposition: A | Discharge status: Home / Self Care | Payer: Medicare Other | Source: Ambulatory Visit | Attending: Cardiology | Admitting: Cardiology

## 2014-05-24 DIAGNOSIS — I739 Peripheral vascular disease, unspecified: Secondary | ICD-10-CM | POA: Diagnosis present

## 2014-05-24 NOTE — Progress Notes (Signed)
Arterial Duplex Lower Ext. Completed. Normal ABIs, TBIs and mildly abnormal duplex imaging of the lower extremities.  Oda Cogan, BS, RDMS, RVT

## 2014-05-26 ENCOUNTER — Telehealth: Payer: Self-pay | Admitting: Family Medicine

## 2014-05-26 NOTE — Telephone Encounter (Signed)
Pt called and would like to have a MRI done of his back to be seen at the neuro office. jw

## 2014-05-27 ENCOUNTER — Ambulatory Visit (INDEPENDENT_AMBULATORY_CARE_PROVIDER_SITE_OTHER): Payer: Medicare Other | Admitting: Family Medicine

## 2014-05-27 ENCOUNTER — Encounter: Payer: Self-pay | Admitting: Family Medicine

## 2014-05-27 VITALS — BP 126/61 | HR 93 | Temp 97.8°F | Ht 69.0 in | Wt 187.1 lb

## 2014-05-27 DIAGNOSIS — E1149 Type 2 diabetes mellitus with other diabetic neurological complication: Secondary | ICD-10-CM

## 2014-05-27 DIAGNOSIS — M5430 Sciatica, unspecified side: Secondary | ICD-10-CM | POA: Insufficient documentation

## 2014-05-27 DIAGNOSIS — F329 Major depressive disorder, single episode, unspecified: Secondary | ICD-10-CM | POA: Diagnosis not present

## 2014-05-27 DIAGNOSIS — R634 Abnormal weight loss: Secondary | ICD-10-CM | POA: Insufficient documentation

## 2014-05-27 DIAGNOSIS — G8929 Other chronic pain: Secondary | ICD-10-CM

## 2014-05-27 DIAGNOSIS — F32A Depression, unspecified: Secondary | ICD-10-CM

## 2014-05-27 DIAGNOSIS — I10 Essential (primary) hypertension: Secondary | ICD-10-CM | POA: Diagnosis not present

## 2014-05-27 DIAGNOSIS — M549 Dorsalgia, unspecified: Secondary | ICD-10-CM | POA: Insufficient documentation

## 2014-05-27 DIAGNOSIS — R809 Proteinuria, unspecified: Secondary | ICD-10-CM | POA: Diagnosis not present

## 2014-05-27 DIAGNOSIS — M5441 Lumbago with sciatica, right side: Secondary | ICD-10-CM

## 2014-05-27 DIAGNOSIS — E1140 Type 2 diabetes mellitus with diabetic neuropathy, unspecified: Secondary | ICD-10-CM | POA: Diagnosis not present

## 2014-05-27 LAB — BASIC METABOLIC PANEL
BUN: 19 mg/dL (ref 6–23)
CO2: 25 mEq/L (ref 19–32)
Calcium: 9.3 mg/dL (ref 8.4–10.5)
Chloride: 103 mEq/L (ref 96–112)
Creat: 1.15 mg/dL (ref 0.50–1.35)
Glucose, Bld: 108 mg/dL — ABNORMAL HIGH (ref 70–99)
Potassium: 4.2 mEq/L (ref 3.5–5.3)
Sodium: 138 mEq/L (ref 135–145)

## 2014-05-27 LAB — POCT GLYCOSYLATED HEMOGLOBIN (HGB A1C): Hemoglobin A1C: 5.9

## 2014-05-27 LAB — POCT URINALYSIS DIPSTICK
Bilirubin, UA: NEGATIVE
Blood, UA: NEGATIVE
Glucose, UA: NEGATIVE
Ketones, UA: NEGATIVE
Leukocytes, UA: NEGATIVE
Nitrite, UA: NEGATIVE
Protein, UA: 100
Spec Grav, UA: 1.02
Urobilinogen, UA: 0.2
pH, UA: 5.5

## 2014-05-27 MED ORDER — HYDROCODONE-ACETAMINOPHEN 7.5-325 MG PO TABS: 1.0000 | ORAL_TABLET | Freq: Three times a day (TID) | ORAL | Status: DC | PRN

## 2014-05-27 MED ORDER — VENLAFAXINE HCL ER 37.5 MG PO CP24: 37.5000 mg | ORAL_CAPSULE | Freq: Every day | ORAL | Status: DC

## 2014-05-27 NOTE — Assessment & Plan Note (Addendum)
   Continue metformin  A1C  Have patient return to clinic to have meter set up

## 2014-05-27 NOTE — Patient Instructions (Signed)
Thank you for coming to see me today. It was a pleasure. Today we talked about:   Diabetes: I am checking your A1C today. No changes. Please return to the office tomorrow to have someone help you with your meter.  High blood pressure: blood pressure controlled today. Continue your lisinopril and hydrochlorothiazide  Depression: I am starting you on Effexor 37.5mg  daily. Please look out for any symptoms of increased fatigue, fever, sweats, agitation, muscle aches  Back pain: As directed by your neurologist, you can increase your gabapentin to 600mg  three times per day. I am refilling your Norco. Please follow-up with your neurosurgeon as you have discussed with me  Protein in urine (proteinuria): I am going to get an ultrasound of your kidneys and check some blood work today   Please make an appointment to see me in 2 weeks for follow-up.  If you have any questions or concerns, please do not hesitate to call the office at [phone].  Sincerely,  Cordelia Poche, MD     Serotonin Syndrome Serotonin is a brain chemical that regulates the nervous system. Some kinds of drugs increase the amount of serotonin in your body. Drugs that increase the serotonin in your body include:   Anti-depressant medications.  St. John's wort.  Recreational drugs.  Migraine medicines.  Some pain medicines. SYMPTOMS Combining these drugs increases the risk that you will become ill with a toxic condition called serotonin syndrome.  Symptoms of too much serotonin include:  Confusion.  Agitation.  Weakness.  Insomnia.  Fever.  Sweats. Other symptoms that may develop include:  Shakiness.  Muscle spasms.  Seizures. TREATMENT  Hospital treatment is often needed until the effects are controlled.  Avoiding the combination of medicines listed above is recommended.  Check with your doctor if you are concerned about your medicine or the side effects. Document Released: 03/22/2004  Document Revised: 05/07/2011 Document Reviewed: 02/12/2005 [facility] Patient Information 2015 [place], [place]. This information is not intended to replace advice given to you by your health care provider. Make sure you discuss any questions you have with your health care provider.

## 2014-05-27 NOTE — Assessment & Plan Note (Signed)
Possibly nothing to be concerned with, however, not overtly intentional. Could be related to Azilect therapy. Patient with recent colonoscopy for which he said was normal.  Will watch weight for now

## 2014-05-27 NOTE — Assessment & Plan Note (Signed)
   Continue lisinopril and hydrochlorothiazide 

## 2014-05-27 NOTE — Progress Notes (Signed)
    Subjective    Tyler Lester is a 69 y.o. male that presents for an office visit.   1. Depression: Increased irritability with increased mood swings. Has having depressed feeling regarding back/leg pain. Losing interest in activities he enjoys doing. Feels like he's :dropping.: He has not tried anything to help with his mood.  2. Diabetes: Patient is not checking his blood sugar as he states he needs to be shown how to use it. He is adherent with metformin 1000mg  BID  3. Weight loss: about 20lbs in the last 6 months. Unintentional although he has been walking more at the [facility]. He has associated fatigue although that may be related to his depression. He states he had a colonoscopy in 2011 that was normal. He is not a smoker. No dark stools. No hemoptysis. No chills.  4. Back pain: Pain worse on right side. Radiates from back down the back of his thigh and leg to his foot. Vicodin also helps with his pain.   History  Substance Use Topics  . Smoking status: Never Smoker   . Smokeless tobacco: Never Used  . Alcohol Use: Yes     Comment: rare alcohol /beer    Allergies  Allergen Reactions  . Poison Ivy Treatments Hives    No orders of the defined types were placed in this encounter.    ROS  Per HPI   Objective   BP 126/61 mmHg  Pulse 93  Temp(Src) 97.8 F (36.6 C) (Oral)  Ht 5\' 9"  (1.753 m)  Wt 187 lb 1.6 oz (84.868 kg)  BMI 27.62 kg/m2  General: Fair appearing, no distress Musculoskeletal: Straight leg positive on right leg. Some tenderness of paraspinal area of lumbar region of back Neuro: Sustained tremor of left hand, dyskinesia noted, 2+ patellar reflexes bilaterally, could not get good achilles reflexes. 4/5 plantar and dorsiflexion Psych: Flat affect, little eye contact  Assessment and Plan   Please refer to problem based charting of assessment and plan

## 2014-05-27 NOTE — Assessment & Plan Note (Signed)
   Start Effexor XR 37.5mg  daily  Follow-up in 2 weeks

## 2014-05-27 NOTE — Telephone Encounter (Signed)
Pt seen if office with Dr. Teryl Lucy today, 05/27/2014. Katharina Caper, [name]

## 2014-05-27 NOTE — Assessment & Plan Note (Signed)
   SPEP, UPEP  Urinalysis  Renal ultrasound  Bmet

## 2014-05-27 NOTE — Assessment & Plan Note (Signed)
   Refill Norco 7.5-325mg  TID PRN

## 2014-05-31 LAB — PROTEIN ELECTROPHORESIS, SERUM
Albumin ELP: 3.8 g/dL (ref 3.8–4.8)
Alpha-1-Globulin: 0.3 g/dL (ref 0.2–0.3)
Alpha-2-Globulin: 0.8 g/dL (ref 0.5–0.9)
Beta 2: 0.6 g/dL — ABNORMAL HIGH (ref 0.2–0.5)
Beta Globulin: 0.4 g/dL (ref 0.4–0.6)
Gamma Globulin: 1.4 g/dL (ref 0.8–1.7)
Total Protein, Serum Electrophoresis: 7.2 g/dL (ref 6.1–8.1)

## 2014-06-01 ENCOUNTER — Ambulatory Visit: Payer: Medicare Other | Attending: Audiology | Admitting: Audiology

## 2014-06-01 ENCOUNTER — Encounter: Payer: Self-pay | Admitting: Family Medicine

## 2014-06-01 LAB — PROTEIN ELECTROPHORESIS, URINE REFLEX
Albumin: 59.6 %
Alpha-1-Globulin, U: 16.7 %
Alpha-2-Globulin, U: 8.8 %
Beta Globulin, U: 9.4 %
Gamma Globulin, U: 5.5 %
Total Protein, Urine: 49 mg/dL

## 2014-06-04 ENCOUNTER — Ambulatory Visit (HOSPITAL_COMMUNITY): Payer: Medicare Other

## 2014-06-07 ENCOUNTER — Encounter: Payer: Self-pay | Admitting: Family Medicine

## 2014-06-09 ENCOUNTER — Ambulatory Visit: Payer: Medicare Other

## 2014-06-09 ENCOUNTER — Ambulatory Visit (INDEPENDENT_AMBULATORY_CARE_PROVIDER_SITE_OTHER): Payer: Medicare Other | Admitting: Podiatry

## 2014-06-09 ENCOUNTER — Encounter: Payer: Self-pay | Admitting: Podiatry

## 2014-06-09 DIAGNOSIS — B351 Tinea unguium: Secondary | ICD-10-CM

## 2014-06-09 DIAGNOSIS — R52 Pain, unspecified: Secondary | ICD-10-CM | POA: Diagnosis not present

## 2014-06-09 DIAGNOSIS — L97511 Non-pressure chronic ulcer of other part of right foot limited to breakdown of skin: Secondary | ICD-10-CM

## 2014-06-09 DIAGNOSIS — E1149 Type 2 diabetes mellitus with other diabetic neurological complication: Secondary | ICD-10-CM

## 2014-06-09 MED ORDER — SILVER SULFADIAZINE 1 % EX CREA: 1.0000 "application " | TOPICAL_CREAM | Freq: Every day | CUTANEOUS | Status: DC

## 2014-06-09 MED ORDER — CEPHALEXIN 500 MG PO CAPS: 500.0000 mg | ORAL_CAPSULE | Freq: Three times a day (TID) | ORAL | Status: DC

## 2014-06-09 NOTE — Patient Instructions (Signed)
Continue daily dressing changes as discussed with silvadene. Start antibiotics Monitor for any signs/symptoms of infection. Call the office immediately if any occur or go directly to the emergency room. Call with any questions/concerns.

## 2014-06-11 ENCOUNTER — Telehealth: Payer: Self-pay | Admitting: Family Medicine

## 2014-06-11 NOTE — Telephone Encounter (Signed)
Tyler Lester states that the medication for his Parkinson's is not helping.  Still having lots of tremors.  Need something else that will help.

## 2014-06-14 NOTE — Telephone Encounter (Signed)
Unsure of what kidney problems he is referring to. Will need elaboration. Also, patient's neurologist, Dr. Jaynee Eagles, is managing Parkinson disease in this patient. Patient will need to follow-up with Dr. Cathren Laine office for change in management.

## 2014-06-14 NOTE — Telephone Encounter (Signed)
Tyler Lester is needing to get referred to a kidney specialist.  Having issues in that area. Please contact patient

## 2014-06-14 NOTE — Progress Notes (Signed)
Patient ID: Tyler Lester, male   DOB: 10/18/1945, 69 y.o.   MRN: [id number]  Subjective: 69 y.o.-year-old male returns the office today for painful, elongated, thickened toenails which he is unable to trim himself. Denies any redness or drainage around the nails. Denies any acute changes since last appointment and no new complaints today. Denies any systemic complaints such as fevers, chills, nausea, vomiting.   Objective: AAO 3, NAD DP/PT pulses decreased, CRT less than 3 seconds Protective sensation decreased with Simms Weinstein monofilament, Achilles tendon reflex intact.  Nails hypertrophic, dystrophic, elongated, brittle, discolored 10. There is tenderness overlying the nails 1-5 bilaterally. There is no surrounding erythema or drainage along the nail sites. On the lateral aspect of the right fourth toe there is hyperkeratotic lesion. Upon debridement lesion there is a small superficial collection of purulence. There is an underlying superficial wound measures 0.8 x 0.2 synovators granular wound base. There is no probing, undermining, tunneling. There is no surrounding erythema to the digit or edema. No areas of fluctuance or crepitus. No open lesions or pre-ulcerative lesions are identified. No other areas of tenderness bilateral lower extremities. No overlying edema, erythema, increased warmth. No pain with calf compression, swelling, warmth, erythema.  Assessment: Patient presents with symptomatic onychomycosis; right 4th toe infection/ulcer  Plan: -Treatment options including alternatives, risks, complications were discussed -Nails sharply debrided 10 without complication/bleeding. -Lesion on the lateral aspect of the right fourth toe sharply debrided to reveal underlying ulceration. Area was cleaned and Silvadene was applied followed by dry sterile dressing. Continue with daily dressing changes as discussed. Prescribed Keflex.  -Paperwork for diabetic shoe precertification was  completed.  -Discussed daily foot inspection. If there are any changes, to call the office immediately.  -Follow-up in 2 weeksor sooner if any problems are to arise. In the meantime, encouraged to call the office with any questions, concerns, changes symptoms. At next appointment if not adequate healing will obtain ABI/PVR.

## 2014-06-15 ENCOUNTER — Encounter: Payer: Medicare Other | Attending: Physical Medicine & Rehabilitation

## 2014-06-15 ENCOUNTER — Ambulatory Visit: Payer: Medicare Other | Admitting: Physical Medicine & Rehabilitation

## 2014-06-16 ENCOUNTER — Ambulatory Visit (INDEPENDENT_AMBULATORY_CARE_PROVIDER_SITE_OTHER): Payer: Medicare Other | Admitting: Family Medicine

## 2014-06-16 ENCOUNTER — Encounter: Payer: Self-pay | Admitting: Family Medicine

## 2014-06-16 VITALS — BP 142/90 | HR 80 | Temp 97.6°F | Wt 186.0 lb

## 2014-06-16 DIAGNOSIS — R251 Tremor, unspecified: Secondary | ICD-10-CM | POA: Diagnosis not present

## 2014-06-16 DIAGNOSIS — G8929 Other chronic pain: Secondary | ICD-10-CM

## 2014-06-16 MED ORDER — HYDROCODONE-ACETAMINOPHEN 7.5-325 MG PO TABS: 1.0000 | ORAL_TABLET | Freq: Three times a day (TID) | ORAL | Status: DC | PRN

## 2014-06-16 NOTE — Assessment & Plan Note (Signed)
Patient with Parkinson's disease diagnosis from neurology   he is on Azilect, an MAO B inhibitor  encouraged him to follow-up with them for his concerns of worsening disease   note left greater than right pill-rolling tremor as well as masklike facies today

## 2014-06-16 NOTE — Progress Notes (Signed)
Patient ID: Tyler Lester, male   DOB: 1945-07-05, 69 y.o.   MRN: [id number]   HPI  Patient presents today for  Same-day appointment for chronic pain and worsening tremor   chronic pain Described as right-sided low back pain radiating down the back of his legs. He denies bowel or bladder dysfunction, leg weakness, and saddle anesthesia. States that the pain has been  Worse than baseline but unchanging for the last 4 months. He states that he is taking 5 hydrocodone 7.5 mg daily now ( 3during the day and 2 at night)  requests changing his medication to oxycodone.   tremor  states that it's been worsening for several weeks , has tried to call neurology for an appointment is waiting for response. I encouraged him to call them again explain his situation and he agrees with this.  He is taking Azilect regularly  Smoking status noted ROS: Per HPI  Objective: BP 142/90 mmHg  Pulse 80  Temp(Src) 97.6 F (36.4 C) (Oral)  Wt 186 lb (84.369 kg) Gen: NAD, alert, cooperative with exam HEENT: NCAT CV: RRR, good S1/S2, no murmur Resp: CTABL, no wheezes, non-labored Abd: SNTND, BS present, no guarding or organomegaly Ext:  Right lower extremity with walking boot on Neuro: Alert and oriented,  Pill-rolling tremor left greater than right, masklike facies, strength 5/5 in bilateral lower extremities, sensation intact in bilateral lower extremities as well, slightly hyperreflexic in bilateral patellar tendons  musculoskeletal: no midline lumbar tenderness, right-sided soft tissue paraspinal tenderness to palpation in the area of L4/L5  Assessment and plan:  Chronic pain Chronic low back pain, stable  no red flags , red flags provided and reviewed in detail   on chronic narcotics from his PCP , sounds like he is having an escalation of dose /need  discussed with him that escalations dose potency of narcotic need to be discussed with his PCP Provided extra 30 pills of his usual 7.5 mg hydrocodone    Also encouraged him to seek epidural injections with his orthopedist   Tremor  Patient with Parkinson's disease diagnosis from neurology   he is on Azilect, an MAO B inhibitor  encouraged him to follow-up with them for his concerns of worsening disease   note left greater than right pill-rolling tremor as well as masklike facies today     Meds ordered this encounter  Medications  . HYDROcodone-acetaminophen (NORCO) 7.5-325 MG per tablet    Sig: Take 1 tablet by mouth every 8 (eight) hours as needed for moderate pain.    Dispense:  30 tablet    Refill:  0

## 2014-06-16 NOTE — Patient Instructions (Signed)
Great to meet you  PLease make an appointment with Dr. Lonny Prude in the next 2 week sto discuss your pain medications. Consider the injections with your orthopedist Please call you r neurologists office to be seen again for your parkinson's disease.   Back Pain, Adult Low back pain is very common. About 1 in 5 people have back pain.The cause of low back pain is rarely dangerous. The pain often gets better over time.About half of people with a sudden onset of back pain feel better in just 2 weeks. About 8 in 10 people feel better by 6 weeks.  CAUSES Some common causes of back pain include:  Strain of the muscles or ligaments supporting the spine.  Wear and tear (degeneration) of the spinal discs.  Arthritis.  Direct injury to the back. DIAGNOSIS Most of the time, the direct cause of low back pain is not known.However, back pain can be treated effectively even when the exact cause of the pain is unknown.Answering your caregiver's questions about your overall health and symptoms is one of the most accurate ways to make sure the cause of your pain is not dangerous. If your caregiver needs more information, he or she may order lab work or imaging tests (X-rays or MRIs).However, even if imaging tests show changes in your back, this usually does not require surgery. HOME CARE INSTRUCTIONS For many people, back pain returns.Since low back pain is rarely dangerous, it is often a condition that people can learn to [facility] their own.   Remain active. It is stressful on the back to sit or stand in one place. Do not sit, drive, or stand in one place for more than 30 minutes at a time. Take short walks on level surfaces as soon as pain allows.Try to increase the length of time you walk each day.  Do not stay in bed.Resting more than 1 or 2 days can delay your recovery.  Do not avoid exercise or work.Your body is made to move.It is not dangerous to be active, even though your back may hurt.Your  back will likely heal faster if you return to being active before your pain is gone.  Pay attention to your body when you bend and lift. Many people have less discomfortwhen lifting if they bend their knees, keep the load close to their bodies,and avoid twisting. Often, the most comfortable positions are those that put less stress on your recovering back.  Find a comfortable position to sleep. Use a firm mattress and lie on your side with your knees slightly bent. If you lie on your back, put a pillow under your knees.  Only take over-the-counter or prescription medicines as directed by your caregiver. Over-the-counter medicines to reduce pain and inflammation are often the most helpful.Your caregiver may prescribe muscle relaxant drugs.These medicines help dull your pain so you can more quickly return to your normal activities and healthy exercise.  Put ice on the injured area.  Put ice in a plastic bag.  Place a towel between your skin and the bag.  Leave the ice on for 15-20 minutes, 03-04 times a day for the first 2 to 3 days. After that, ice and heat may be alternated to reduce pain and spasms.  Ask your caregiver about trying back exercises and gentle massage. This may be of some benefit.  Avoid feeling anxious or stressed.Stress increases muscle tension and can worsen back pain.It is important to recognize when you are anxious or stressed and learn ways to manage it.Exercise  is a great option. SEEK MEDICAL CARE IF:  You have pain that is not relieved with rest or medicine.  You have pain that does not improve in 1 week.  You have new symptoms.  You are generally not feeling well. SEEK IMMEDIATE MEDICAL CARE IF:   You have pain that radiates from your back into your legs.  You develop new bowel or bladder control problems.  You have unusual weakness or numbness in your arms or legs.  You develop nausea or vomiting.  You develop abdominal pain.  You feel  faint. Document Released: 02/12/2005 Document Revised: 08/14/2011 Document Reviewed: 06/16/2013 [facility] Patient Information 2015 [place], [place]. This information is not intended to replace advice given to you by your health care provider. Make sure you discuss any questions you have with your health care provider.

## 2014-06-16 NOTE — Assessment & Plan Note (Addendum)
Chronic low back pain, stable  no red flags , red flags provided and reviewed in detail   on chronic narcotics from his PCP , sounds like he is having an escalation of dose /need  discussed with him that escalations dose potency of narcotic need to be discussed with his PCP Provided extra 30 pills of his usual 7.5 mg hydrocodone  Also encouraged him to seek epidural injections with his orthopedist

## 2014-06-21 ENCOUNTER — Telehealth: Payer: Self-pay | Admitting: Family Medicine

## 2014-06-23 ENCOUNTER — Ambulatory Visit: Payer: Medicare Other | Admitting: Podiatry

## 2014-06-28 ENCOUNTER — Ambulatory Visit: Payer: Medicare Other | Admitting: Podiatry

## 2014-07-01 ENCOUNTER — Encounter: Payer: Self-pay | Admitting: Family Medicine

## 2014-07-01 ENCOUNTER — Ambulatory Visit (INDEPENDENT_AMBULATORY_CARE_PROVIDER_SITE_OTHER): Payer: Medicare Other | Admitting: Family Medicine

## 2014-07-01 VITALS — BP 156/80 | HR 70 | Ht 69.0 in | Wt 187.0 lb

## 2014-07-01 DIAGNOSIS — F329 Major depressive disorder, single episode, unspecified: Secondary | ICD-10-CM | POA: Diagnosis not present

## 2014-07-01 DIAGNOSIS — E1140 Type 2 diabetes mellitus with diabetic neuropathy, unspecified: Secondary | ICD-10-CM

## 2014-07-01 DIAGNOSIS — E1149 Type 2 diabetes mellitus with other diabetic neurological complication: Secondary | ICD-10-CM

## 2014-07-01 DIAGNOSIS — M5441 Lumbago with sciatica, right side: Secondary | ICD-10-CM

## 2014-07-01 DIAGNOSIS — F32A Depression, unspecified: Secondary | ICD-10-CM

## 2014-07-01 DIAGNOSIS — G8929 Other chronic pain: Secondary | ICD-10-CM

## 2014-07-01 DIAGNOSIS — I10 Essential (primary) hypertension: Secondary | ICD-10-CM

## 2014-07-01 LAB — TSH: TSH: 1.119 u[IU]/mL (ref 0.350–4.500)

## 2014-07-01 MED ORDER — VENLAFAXINE HCL ER 37.5 MG PO CP24: 37.5000 mg | ORAL_CAPSULE | Freq: Every day | ORAL | Status: DC

## 2014-07-01 MED ORDER — HYDROCODONE-ACETAMINOPHEN 7.5-325 MG PO TABS: 1.0000 | ORAL_TABLET | Freq: Three times a day (TID) | ORAL | Status: DC | PRN

## 2014-07-01 MED ORDER — LISINOPRIL 10 MG PO TABS: 10.0000 mg | ORAL_TABLET | Freq: Every day | ORAL | Status: DC

## 2014-07-01 NOTE — Patient Instructions (Signed)
Thank you for coming to see me today. It was a pleasure. Today we talked about:   Back pain: I am refilling your Norco and shredding your other prescription.  Diabetes: No changes today. Please remember to come to have your meter set up  Hypertension: you blood pressure was very elevated today. I will increase your lisinopril. You will now be taking Lisinopril 10mg  daily.  Depression: I am refilling your Effexor 37.5mg  daily. We will see if this is helping you the next time I see you  Please make an appointment to see me in 4 weeks for follow-up.  If you have any questions or concerns, please do not hesitate to call the office at [phone].  Sincerely,  Cordelia Poche, MD

## 2014-07-01 NOTE — Progress Notes (Signed)
    Subjective    Tyler Lester is a 69 y.o. male that presents for a follow-up visit for chronic issues.   1. Back pain: Saw [employer] [facility] [employer]. They prescribed Percocet 10-325mg  #60. He states that they are planning on giving him epidural injections. His pain sometimes causes him to have near-falls. The narcotics are helping with his pain.   2. Diabetes: He is adherent with metformin. No side effects. He is still not checking his blood sugar. He has a meter but has not set it up. No hypoglycemia symptoms. No polydipsia but states he has polyuria.  3. Hypertension: Adherent with lisinopril and hydrochlorothiazide. No chest pain or shortness of breath  4. Depression: He is unsure if he is taking his Effexor XR. Associated symptoms include fatigue. He still has some symptoms of depressed mood and would like to feel better overall. He would like his TSH to be checked, although has had normal TSH in the past.   History  Substance Use Topics  . Smoking status: Never Smoker   . Smokeless tobacco: Never Used  . Alcohol Use: Yes     Comment: rare alcohol /beer    Allergies  Allergen Reactions  . Poison Ivy Treatments Hives    No orders of the defined types were placed in this encounter.    ROS  Per HPI   Objective   BP 172/91 mmHg  Pulse 71  Ht 5\' 9"  (1.753 m)  Wt 187 lb (84.823 kg)  BMI 27.60 kg/m2  General: Fair appearing, no distress Psych: masked facies, flat affect  Assessment and Plan   Please refer to problem based charting of assessment and plan

## 2014-07-02 ENCOUNTER — Ambulatory Visit: Payer: Medicare Other | Admitting: Physical Medicine & Rehabilitation

## 2014-07-02 NOTE — Assessment & Plan Note (Addendum)
Patient's pain controlled with Norco. Currently being managed by [facility] who may be managing with epidural injections  Refill Norco 7.5-325mg  TID prn #90  Hopefully with epidural injections, can eventually wean narcotic use, especially with patient's age and possible developing dementia  Follow-up one month

## 2014-07-02 NOTE — Assessment & Plan Note (Signed)
A1C controlled and below goal. No hypoglycemia  Continue metformin  Check fasting blood sugars daily

## 2014-07-02 NOTE — Assessment & Plan Note (Signed)
Blood pressure not controlled. Patient states adherence.  Increase to lisinopril 10mg  daily  Follow-up in two weeks for blood pressure check with nurse

## 2014-07-02 NOTE — Assessment & Plan Note (Addendum)
PHQ-9 improved from last visit. Patient may or may not be taking Effexor consistently. Discussed bringing medications to his next visit  Refill Effexor  Unsure of Parksinson disease is playing a role, so will obtain mini mental exam at next visit.

## 2014-07-04 ENCOUNTER — Encounter: Payer: Self-pay | Admitting: Family Medicine

## 2014-07-05 ENCOUNTER — Encounter: Payer: Self-pay | Admitting: Family Medicine

## 2014-07-05 ENCOUNTER — Ambulatory Visit: Payer: Medicare Other | Admitting: Podiatry

## 2014-07-06 ENCOUNTER — Ambulatory Visit: Payer: Medicare Other | Attending: Audiology | Admitting: Audiology

## 2014-07-14 ENCOUNTER — Ambulatory Visit: Payer: Medicare Other | Admitting: Audiology

## 2014-07-15 ENCOUNTER — Telehealth: Payer: Self-pay | Admitting: Family Medicine

## 2014-07-15 NOTE — Telephone Encounter (Signed)
Pt says his metformin is making him lose too much weight His Parkinson medication (he didn't know what it was) wasn't agreeing with him He fell yesterday in his kitchen. Says he needs something done quickly

## 2014-07-16 NOTE — Telephone Encounter (Signed)
Will forward to MD.  Patient's next clinic appt is 6-16. Jazmin Hartsell,CMA

## 2014-07-19 ENCOUNTER — Ambulatory Visit: Payer: Medicare Other | Admitting: Podiatry

## 2014-07-21 ENCOUNTER — Telehealth: Payer: Self-pay | Admitting: Family Medicine

## 2014-07-21 NOTE — Telephone Encounter (Signed)
Pt thinks he needs to come off the metformin. Says he has lost too much weight. Would like something else The med for the depression venlafaxine is not agreeing with him Wants something else for the sugar and something else for the depression.

## 2014-07-22 NOTE — Telephone Encounter (Signed)
Called patient but voicemail reached. Left message to call back with more appropriate time.

## 2014-07-23 ENCOUNTER — Ambulatory Visit (HOSPITAL_BASED_OUTPATIENT_CLINIC_OR_DEPARTMENT_OTHER): Payer: Medicare Other | Admitting: Physical Medicine & Rehabilitation

## 2014-07-23 ENCOUNTER — Other Ambulatory Visit: Payer: Self-pay | Admitting: Physical Medicine & Rehabilitation

## 2014-07-23 ENCOUNTER — Encounter: Payer: Self-pay | Admitting: Physical Medicine & Rehabilitation

## 2014-07-23 ENCOUNTER — Encounter: Payer: Medicare Other | Attending: Physical Medicine & Rehabilitation

## 2014-07-23 VITALS — BP 142/86 | HR 75 | Resp 14

## 2014-07-23 DIAGNOSIS — G894 Chronic pain syndrome: Secondary | ICD-10-CM

## 2014-07-23 DIAGNOSIS — M4712 Other spondylosis with myelopathy, cervical region: Secondary | ICD-10-CM | POA: Insufficient documentation

## 2014-07-23 DIAGNOSIS — G20 Parkinson's disease: Secondary | ICD-10-CM | POA: Insufficient documentation

## 2014-07-23 DIAGNOSIS — M47812 Spondylosis without myelopathy or radiculopathy, cervical region: Secondary | ICD-10-CM | POA: Insufficient documentation

## 2014-07-23 DIAGNOSIS — M961 Postlaminectomy syndrome, not elsewhere classified: Secondary | ICD-10-CM

## 2014-07-23 DIAGNOSIS — Z79899 Other long term (current) drug therapy: Secondary | ICD-10-CM | POA: Diagnosis not present

## 2014-07-23 DIAGNOSIS — Z5181 Encounter for therapeutic drug level monitoring: Secondary | ICD-10-CM | POA: Diagnosis not present

## 2014-07-23 NOTE — Progress Notes (Signed)
Subjective:    Patient ID: Tyler Lester, male    DOB: January 18, 1946, 69 y.o.   MRN: [id number]  HPI Chief complaint is low back pain with some radiation into the right big toe, onset was about 6 months ago shortly after his cervical spine surgery  69 year old male with history of Parkinson's disease as well as cervical myelopathy. He underwent a C3-C7 posterior spinal decompression and fusion on 11/12/2013 by Dr. Bonita Quin ski. Postoperatively patient noticed increasing low back pain which also radiates toward his big toe on the right side. Patient has previous history of back pain and had MRI of the lumbar spine in 2012 demonstrating an L5-S1 synovial cyst potentially causing stenosis At the right lateral recess and right L5-S1 foramen.  Patient lives independently but he's had some increasing problems with mobility. He had a fall at home. He does have a friend that comes in to help him. He is currently using a quad cane but does have a walker at home. When he fell he was using a quad cane rather than the walker.  Patient denies any problems with his bowel or bladder.  Patient also wearing a cam walker boot he wears it on his right lower extremity but looking at the boot it is a left-sided boot. He cannot tell me why he is wearing it.Also could not recall which Dr. Given to him however after reviewing the chart it appears to be Podiatry. Note indicates and patient indicates that the right lower extremity is his main problem however he has a left cam walker boot.  Pain Inventory Average Pain 10 Pain Right Now 10 My pain is sharp  In the last 24 hours, has pain interfered with the following? General activity 10 Relation with others 10 Enjoyment of life 10 What TIME of day is your pain at its worst? daytime Sleep (in general) Poor  Pain is worse with: walking, bending, standing and some activites Pain improves with: heat/ice, therapy/exercise and medication Relief from Meds:  10  Mobility walk with assistance use a cane use a walker ability to climb steps?  yes do you drive?  no Do you have any goals in this area?  no  Function retired  Neuro/Psych trouble walking depression  Prior Studies Any changes since last visit?  no   Study Result       Clinical Data: Low back pain.  Lumbar stenosis.    MRI LUMBAR SPINE WITHOUT CONTRAST    Technique:  Multiplanar and multiecho pulse sequences of the lumbar spine were obtained without intravenous contrast.    Comparison: Lumbar spine MRI 04/15/2009.    Findings: The same numbering system is used with the last full intervertebral disc space labeled L5-S1.  The conus medullaris terminates at L1.    Stable marrow signal abnormality likely due to obesity, smoking, anemia or osteoporosis.    No significant retroperitoneal findings.    L1-2:  No significant findings.    L2-3:  Mild bulging annulus with mild lateral recess encroachment but no spinal or foraminal stenosis.    L3-4:  Diffuse bulging annulus, short pedicles and facet disease contribute to moderate spinal and bilateral lateral recess stenosis.  Mild foraminal stenosis bilaterally.  No significant change.    L4-5:  Diffuse bulging annulus, short pedicles and facet disease contribute to mild to moderate spinal and lateral recess stenosis. Shallow broad-based right foraminal/extraforaminal disc protrusion potentially irritating the right L4 nerve root.  This is a stable finding.    L5-S1:  There is a complex right sided synovial cyst contributing to lateral recess stenosis and foraminal stenosis possibly involving both the right L5 and S1 nerve roots.  This is a progressive finding when compared the prior study.    IMPRESSION:    1.  Stable spinal, lateral recess and foraminal stenosis at  L3-4 and  L4-5. 2.  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    Physicians  involved in your care Any changes since last visit?  no   Family History  Problem Relation Age of Onset  . Cancer - Other Other   . Diabetes Other   . Hypertension Other    History   Social History  . Marital Status: Single    Spouse Name: N/A  . Number of Children: 2  . Years of Education: 12   Occupational History  . Retired    . Disability    Social History Main Topics  . Smoking status: Never Smoker   . Smokeless tobacco: Never Used  . Alcohol Use: Yes     Comment: rare alcohol /beer  . Drug Use: No  . Sexual Activity: Not on file   Other Topics Concern  . None   Social History Narrative   Patient lives at home alone.    Patient have 2 children.    Patient has a high school education    Patient is right handed.    Patient is retired on disability.       Past Surgical History  Procedure Laterality Date  . Colon surgery    . Cataract extraction w/ intraocular lens implant Left 06/2012    laser for posterior capsule?  Marland Kitchen Tonsillectomy    . Posterior cervical fusion/foraminotomy N/A 11/12/2013    Procedure: Posterior cervical decompression fusion, cervical 3-4, cervical 4-5, cervical 5-6, cervical 6-7 with instrumentation and allograft   (LEVEL 4);  Surgeon: Sinclair Ship, MD;  Location: [facility];  Service: Orthopedics;  Laterality: N/A;  Posterior cervical decompression fusion, cervical 3-4, cervical 4-5, cervical 5-6, cervical 6-7 with instrumentation and allograft   Past Medical History  Diagnosis Date  . Diabetes mellitus   . Chronic pain   . Hypertension   . Hyperlipidemia   . Sleep apnea   . Shortness of breath   . Inguinal hernia     right  . Pneumonia   . Numbness     T/O  . Bilateral foot pain    BP 142/86 mmHg  Pulse 75  Resp 14  SpO2 99%  Opioid Risk Score: 1 Fall Risk Score: Moderate Fall Risk (6-13 points)`1  Depression screen PHQ 2/9  Depression screen [facility] 2/9 07/23/2014 07/01/2014 06/16/2014 05/27/2014 05/27/2014 04/27/2014 04/05/2014   Decreased Interest 1 2 0 3 0 1 0  Down, Depressed, Hopeless 3 2 0 3 1 1  0  PHQ - 2 Score 4 4 0 6 1 2  0  Altered sleeping 0 - - 0 - - -  Tired, decreased energy 1 - - 1 - - -  Change in appetite 0 - - - - - -  Feeling bad or failure about yourself  2 - - 3 - - -  Trouble concentrating 1 - - 1 - - -  Moving slowly or fidgety/restless 1 - - 3 - - -  Suicidal thoughts 1 - - 3 - - -  PHQ-9 Score 10 - - 17 - - -  Difficult doing work/chores - - -  Extremely dIfficult - - -     Review of Systems  Constitutional:       Weight loss  Endocrine:       High blood sugar  Musculoskeletal: Positive for gait problem.  Psychiatric/Behavioral: Positive for dysphoric mood.  All other systems reviewed and are negative.      Objective:   Physical Exam  Constitutional: He appears well-developed and well-nourished.  HENT:  Head: Normocephalic and atraumatic.  Eyes: Conjunctivae are normal. Pupils are equal, round, and reactive to light.  Neurological: He is alert. He displays tremor. A sensory deficit is present. Gait abnormal.  Reflex Scores:      Tricep reflexes are 0 on the right side and 0 on the left side.      Bicep reflexes are 0 on the right side and 0 on the left side.      Brachioradialis reflexes are 0 on the right side and 0 on the left side.      Patellar reflexes are 3+ on the right side and 3+ on the left side.      Achilles reflexes are 3+ on the right side and 3+ on the left side. Tremor resting left hand Decreased sensation in the left upper extremity  Sensation equal to pinprick at C4 Reduced sensation to pinprick left C5 Reduced sensation to pinprick right C6 and right C7 Equal sensation to pinprick bilateral C8  Equal sensation to pinprick bilateral L2 Decreased sensation to pinprick left L3 Decreased sensory to pinprick left L4 Decreased sensation of pinprick right L5 Equal sensation to pinprick bilateral S1  Shuffling gait, decreased heel strike, short step  length. Needs to hold onto objects to maintain balance Truncal stiffness  Motor strength is 3 minus at the left deltoid  5 at the right deltoid 5 at the biceps triceps grip on the right 4/5 in the left biceps triceps grip 4/5 bilateral hip flexor and knee extensor   , 3 minus bilateral ankle dorsiflexor plantar flexor  Psychiatric: His affect is blunt. His speech is delayed. He is slowed. Cognition and memory are impaired.  Oriented to city not to name of clinic Oriented to month and year but not to the week  Nursing note and vitals reviewed.         Assessment & Plan:  1. Multifactorial gait disorder this is a combination of Parkinson's disease, cervical myelopathy And possibly lumbar radiculopathy High risk for falls, recommend walking with a walker, we will order home health PT OT to evaluate  2. Chronic pain syndrome this is mainly related to his cervical postlaminectomy syndrome with cervical myelopathy. Given his problem with balance, would try to avoid narcotic pain medicine such as oxycodone or hydrocodone if possible. He is on MAO inhibitor for Parkinson's disease, avoid tramadol, in fact have asked him to stop his venlafaxine, also not a good candidate for fentanyl  We will check urine drug screen, consider low-dose oxycodone  3. Parkinson's disease have given patient name and number of his neurologist so that he can make a follow-up appointment

## 2014-07-23 NOTE — Patient Instructions (Addendum)
Please make appointment with Sarina Ill, MD  [facility] [address] [place], [place] [address]  Phone [phone] Fax [phone]  Increase gabapentin to 3 times per day  Stop venlafaxine it may interact with your Azilect

## 2014-07-24 LAB — PMP ALCOHOL METABOLITE (ETG)

## 2014-07-27 ENCOUNTER — Telehealth: Payer: Self-pay | Admitting: Family Medicine

## 2014-07-27 LAB — OPIATES/OPIOIDS (LC/MS-MS)
Codeine Urine: NEGATIVE ng/mL (ref <50)
Hydrocodone: NEGATIVE ng/mL — AB (ref <50)
Hydromorphone: NEGATIVE ng/mL — AB (ref <50)
Morphine Urine: NEGATIVE ng/mL (ref <50)
Norhydrocodone, Ur: 60 ng/mL (ref <50)
Noroxycodone, Ur: NEGATIVE ng/mL (ref <50)
Oxycodone, ur: NEGATIVE ng/mL (ref <50)
Oxymorphone: NEGATIVE ng/mL (ref <50)

## 2014-07-27 LAB — ETHYL GLUCURONIDE, URINE
Ethyl Glucuronide (EtG): 13692 ng/mL — ABNORMAL HIGH (ref <500)
Ethyl Sulfate (ETS): 5010 ng/mL — ABNORMAL HIGH (ref <100)

## 2014-07-27 NOTE — Telephone Encounter (Signed)
Pt called to request a refill on his hydrocodone. Its not due until Sunday but wanted to call this in early so that the doctor would be able to fill it by then. Blima Rich

## 2014-07-28 LAB — PRESCRIPTION MONITORING PROFILE (SOLSTAS)
Amphetamine/Meth: NEGATIVE ng/mL
Barbiturate Screen, Urine: NEGATIVE ng/mL
Benzodiazepine Screen, Urine: NEGATIVE ng/mL
Buprenorphine, Urine: NEGATIVE ng/mL
Cannabinoid Scrn, Ur: NEGATIVE ng/mL
Carisoprodol, Urine: NEGATIVE ng/mL
Cocaine Metabolites: NEGATIVE ng/mL
Creatinine, Urine: 168.9 mg/dL (ref >20.0)
Fentanyl, Ur: NEGATIVE ng/mL
MDMA URINE: NEGATIVE ng/mL
Meperidine, Ur: NEGATIVE ng/mL
Methadone Screen, Urine: NEGATIVE ng/mL
Nitrites, Initial: NEGATIVE ug/mL
Oxycodone Screen, Ur: NEGATIVE ng/mL
Propoxyphene: NEGATIVE ng/mL
Tapentadol, urine: NEGATIVE ng/mL
Tramadol Scrn, Ur: NEGATIVE ng/mL
Zolpidem, Urine: NEGATIVE ng/mL
pH, Initial: 5.9 pH (ref 4.5–8.9)

## 2014-07-29 ENCOUNTER — Ambulatory Visit (INDEPENDENT_AMBULATORY_CARE_PROVIDER_SITE_OTHER): Payer: Medicare Other | Admitting: Family Medicine

## 2014-07-29 ENCOUNTER — Encounter: Payer: Self-pay | Admitting: Family Medicine

## 2014-07-29 VITALS — BP 144/65 | HR 73 | Temp 97.6°F | Ht 69.0 in | Wt 188.3 lb

## 2014-07-29 DIAGNOSIS — R809 Proteinuria, unspecified: Secondary | ICD-10-CM | POA: Diagnosis not present

## 2014-07-29 DIAGNOSIS — N182 Chronic kidney disease, stage 2 (mild): Secondary | ICD-10-CM | POA: Diagnosis not present

## 2014-07-29 DIAGNOSIS — G8929 Other chronic pain: Secondary | ICD-10-CM

## 2014-07-29 MED ORDER — HYDROCODONE-ACETAMINOPHEN 7.5-325 MG PO TABS: 1.0000 | ORAL_TABLET | Freq: Three times a day (TID) | ORAL | Status: DC | PRN

## 2014-07-29 MED ORDER — LISINOPRIL 20 MG PO TABS: 20.0000 mg | ORAL_TABLET | Freq: Every day | ORAL | Status: DC

## 2014-07-29 MED ORDER — LISINOPRIL 20 MG PO TABS: 10.0000 mg | ORAL_TABLET | Freq: Every day | ORAL | Status: DC

## 2014-07-29 NOTE — Progress Notes (Signed)
   Subjective:    Patient ID: Tyler Lester, male    DOB: Apr 29, 1945, 69 y.o.   MRN: [id number]  HPI 69 year old male with a PMH of HTN, DM-2, CKD stage 2, and chronic pain presents requesting refill on pain medication and to "have my kidneys checked".  CKD  Patient has a prior history of CKD w/ proteinuria. Creatinine is stable.   Patient is concerned about this and would like this reassessed today. Last UA was in March and reveal 100 mg/dL.  He is currently taking Lisinopril 10 mg daily.   No current complaints. No difficulty making urine/voiding.  No dysuria/urinary urgency/frequency.  Chronic pain  PCP not available.   Requesting refill on medication today.  Review of Systems  Constitutional: Negative.   Genitourinary: Negative for dysuria, urgency, flank pain and difficulty urinating.  Musculoskeletal: Positive for back pain.      Objective:   Physical Exam Filed Vitals:   07/29/14 1427  BP: 144/65  Pulse: 73  Temp: 97.6 F (36.4 C)   Vital signs reviewed.  Exam: General: well appearing, NAD. Cardiovascular: RRR. No murmurs, rubs, or gallops. Respiratory: CTAB. No rales, rhonchi, or wheeze. Abdomen: soft, nontender, nondistended.    Assessment & Plan:  See Problem List

## 2014-07-29 NOTE — Patient Instructions (Addendum)
It was nice to see you today.  I have increased your Lisinopril to 20 mg daily (you can use the pills you have at home until they are gone; Take 2 daily).  Once you pick up the prescription take 1 pill daily as directed.  Follow-up closely with Dr. Lonny Prude.  Take care  Dr. Lacinda Axon

## 2014-07-30 LAB — MICROALBUMIN / CREATININE URINE RATIO
Creatinine, Urine: 278.4 mg/dL
Microalb Creat Ratio: 180.3 mg/g — ABNORMAL HIGH (ref 0.0–30.0)
Microalb, Ur: 50.2 mg/dL — ABNORMAL HIGH (ref <2.0)

## 2014-07-30 NOTE — Assessment & Plan Note (Signed)
Urine microalbumin/creatinine ration obtained today. Upon review of the EMR patient with persistent proteinuria. Increased Lisinopril today.

## 2014-07-30 NOTE — Assessment & Plan Note (Signed)
Norco 7.5/325 mg refilled today (# 90).

## 2014-08-03 NOTE — Progress Notes (Addendum)
Urine drug screen for this encounter is inconsistent for prescribed medication. Small amount of metabolite of his hydrocodone reported taken the day before(only trace norhydrocodone) but very high level of alcohol.  Per Dr Letta Pate, he is recommending non narcotic treatment

## 2014-08-09 ENCOUNTER — Telehealth: Payer: Self-pay | Admitting: *Deleted

## 2014-08-09 NOTE — Telephone Encounter (Signed)
Do not feel comfortable prescribing narcotic analgesics given his alcohol use as well as his other problems he is a very high fall risk

## 2014-08-09 NOTE — Telephone Encounter (Signed)
Patient called and asked for a fill on his Hydrocodone 7.5/325.  Patient UDS came back positive for Alcohol.  Numbers are very high.  Please advise.

## 2014-08-10 NOTE — Telephone Encounter (Signed)
I spoke with Tyler Lester and reviewed the reasons why Dr Letta Pate does not want to prescribe narcotic medication for him.  I advised him to see if the previous prescriber would continue to write for him.  Otherwise he has an appt 08/20/14 for a back injection and that may give him relief.  Non narcotic treatment only. He will see if the other doctor will prescribe.

## 2014-08-10 NOTE — Telephone Encounter (Signed)
Pt called back this morning saying he is in pain  He is asking if there is anything we can do.  I read the phone call documentation thread and passed on what was said in the call documentation.  I informed him that a high level of alcohol was detected in his UDS sample and that Dr. Letta Pate does not want to prescribe narcotic analgesics.  Pt then said, "what am I supposed to do?" He has an upcoming procedure appt 08/20/2014.

## 2014-08-12 ENCOUNTER — Ambulatory Visit (INDEPENDENT_AMBULATORY_CARE_PROVIDER_SITE_OTHER): Payer: Medicare Other | Admitting: Family Medicine

## 2014-08-12 ENCOUNTER — Ambulatory Visit: Payer: Medicare Other | Admitting: Family Medicine

## 2014-08-12 ENCOUNTER — Encounter: Payer: Self-pay | Admitting: Clinical

## 2014-08-12 ENCOUNTER — Encounter: Payer: Self-pay | Admitting: Family Medicine

## 2014-08-12 VITALS — BP 151/88 | HR 70 | Temp 97.9°F | Ht 69.0 in | Wt 184.9 lb

## 2014-08-12 DIAGNOSIS — M545 Low back pain, unspecified: Secondary | ICD-10-CM

## 2014-08-12 DIAGNOSIS — G8929 Other chronic pain: Secondary | ICD-10-CM | POA: Diagnosis not present

## 2014-08-12 DIAGNOSIS — G20 Parkinson's disease: Secondary | ICD-10-CM | POA: Diagnosis not present

## 2014-08-12 DIAGNOSIS — Z9181 History of falling: Secondary | ICD-10-CM | POA: Diagnosis not present

## 2014-08-12 NOTE — Assessment & Plan Note (Signed)
Given tremor/gait instability and recent falls Rx to be sent to [facility] for a walker with seat.

## 2014-08-12 NOTE — Progress Notes (Signed)
   Subjective:    Patient ID: Tyler Lester, male    DOB: [birth year], 69 y.o.   MRN: [id number]  HPI  69 year old male with a competent a past medical history including chronic low back pain, and Parkinson's disease presents to the clinic today requesting a walker.  Patient reports that he is in need of a four-wheel walker with seat. He states that he currently uses a cane. Due to his underlying chronic low back pain and Parkinson's disease, his gait is unsteady and he has difficulty standing upright for long periods of time. He has recently fallen twice approximately 3 weeks ago. He feels like he needs his walker to ensure safe mobility and also needs his seat to allow him to rest with long walks given chronic low back pain.    Review of Systems Per HPI    Objective:   Physical Exam Filed Vitals:   08/12/14 1103  BP: 151/88  Pulse: 70  Temp: 97.9 F (36.6 C)   Vital signs reviewed.  Exam: General: well appearing, NAD. HEENT: NCAT. Cardiovascular: RRR. No murmurs, rubs, or gallops. Respiratory: CTAB. No rales, rhonchi, or wheeze. Back: Tender to palpation - R low back. Decreased ROM secondary to pain. Neuro: Resting tremor noted.     Assessment & Plan:  See Problem List

## 2014-08-12 NOTE — Patient Instructions (Signed)
[facility] will be in contact with you regarding your walker.  Please follow-up with your primary doctor name regarding your pain medication.  Take care  Dr. Lacinda Axon

## 2014-08-12 NOTE — Progress Notes (Signed)
Order for a walker with a bench was faxed to [facility] today.  Hunt Oris, MSW, [place]

## 2014-08-17 ENCOUNTER — Ambulatory Visit: Payer: Medicare Other

## 2014-08-20 ENCOUNTER — Ambulatory Visit: Payer: Medicare Other | Admitting: Physical Medicine & Rehabilitation

## 2014-08-24 ENCOUNTER — Ambulatory Visit: Payer: Medicare Other | Admitting: *Deleted

## 2014-08-24 DIAGNOSIS — E1149 Type 2 diabetes mellitus with other diabetic neurological complication: Secondary | ICD-10-CM

## 2014-08-24 NOTE — Progress Notes (Signed)
Patient ID: Tyler Lester, male   DOB: 03-14-1945, 69 y.o.   MRN: [id number] Patient presents for diabetic shoe measurement.  Patient is scanned, measured and has chosen his shoe we will call to schedule an appointment as soon as they arrive.

## 2014-08-31 ENCOUNTER — Ambulatory Visit (INDEPENDENT_AMBULATORY_CARE_PROVIDER_SITE_OTHER): Payer: Medicare Other | Admitting: Family Medicine

## 2014-08-31 ENCOUNTER — Encounter: Payer: Self-pay | Admitting: Family Medicine

## 2014-08-31 VITALS — BP 155/90 | HR 77 | Temp 98.0°F | Ht 69.0 in | Wt 188.1 lb

## 2014-08-31 DIAGNOSIS — E1140 Type 2 diabetes mellitus with diabetic neuropathy, unspecified: Secondary | ICD-10-CM

## 2014-08-31 DIAGNOSIS — I10 Essential (primary) hypertension: Secondary | ICD-10-CM | POA: Diagnosis not present

## 2014-08-31 DIAGNOSIS — E1149 Type 2 diabetes mellitus with other diabetic neurological complication: Secondary | ICD-10-CM

## 2014-08-31 DIAGNOSIS — G8929 Other chronic pain: Secondary | ICD-10-CM | POA: Diagnosis not present

## 2014-08-31 LAB — POCT GLYCOSYLATED HEMOGLOBIN (HGB A1C): Hemoglobin A1C: 5.5

## 2014-08-31 MED ORDER — LISINOPRIL 40 MG PO TABS: 40.0000 mg | ORAL_TABLET | Freq: Every day | ORAL | Status: DC

## 2014-08-31 MED ORDER — HYDROCODONE-ACETAMINOPHEN 7.5-325 MG PO TABS: 1.0000 | ORAL_TABLET | Freq: Three times a day (TID) | ORAL | Status: DC | PRN

## 2014-08-31 MED ORDER — LISINOPRIL 40 MG PO TABS: 20.0000 mg | ORAL_TABLET | Freq: Every day | ORAL | Status: DC

## 2014-08-31 NOTE — Progress Notes (Signed)
    Subjective    Tyler Lester is a 70 y.o. male that presents for a follow-up visit for chronic issues.   1. Chronic pain: Adherent with Norco. Medication helps with symptoms. He is functional at this dose.   2. Hypertension: States he is adherent with lisinopril and hydrochlorothiazide. No chest pain or shortness of breath.   3. Diabetes: Adherent with metformin at home. He does not check his blood sugar.   History  Substance Use Topics  . Smoking status: Never Smoker   . Smokeless tobacco: Never Used  . Alcohol Use: Yes     Comment: rare alcohol /beer    Allergies  Allergen Reactions  . Poison Ivy Treatments Hives    No orders of the defined types were placed in this encounter.    ROS  Per HPI   Objective   BP 155/90 mmHg  Pulse 77  Temp(Src) 98 F (36.7 C) (Oral)  Ht 5\' 9"  (1.753 m)  Wt 188 lb 1.6 oz (85.322 kg)  BMI 27.76 kg/m2  General: Well appearing, no distress Extremities: See documentation for foot exam  Assessment and Plan   Please refer to problem based charting of assessment and plan

## 2014-08-31 NOTE — Patient Instructions (Addendum)
Thank you for coming to see me today. It was a pleasure. Today we talked about:   Chronic pain: refill meds  Diabetes: Discontinue metformin  High blood pressure: increase lisinopril. Please take Lisinopril 40mg  daily and continue Hydrochlorothiazide 25mg  daily. I will recheck your electrolytes at our next visit  Please make an appointment to see me in 4 weeks for follow-up.  If you have any questions or concerns, please do not hesitate to call the office at [phone].  Sincerely,  Cordelia Poche, MD

## 2014-09-01 NOTE — Assessment & Plan Note (Signed)
Not at goal. Will increase lisinopril to 40mg  daily and leave HCTZ at 25mg  daily

## 2014-09-01 NOTE — Assessment & Plan Note (Signed)
A1C improved. Will discontinue metformin. Follow-up A1C in 3 months.

## 2014-09-01 NOTE — Assessment & Plan Note (Signed)
Controlled with Norco. No changes to medication regimen. Will check Narcotics database at next visit.

## 2014-09-08 ENCOUNTER — Ambulatory Visit: Payer: Medicare Other | Admitting: Neurology

## 2014-09-08 ENCOUNTER — Telehealth: Payer: Self-pay

## 2014-09-08 NOTE — Telephone Encounter (Signed)
Patient called to cancel just before appt due to no transportation.

## 2014-09-09 ENCOUNTER — Encounter: Payer: Self-pay | Admitting: Neurology

## 2014-09-14 ENCOUNTER — Ambulatory Visit: Payer: Medicare Other

## 2014-09-14 ENCOUNTER — Ambulatory Visit: Payer: Medicare Other | Admitting: Physical Medicine & Rehabilitation

## 2014-09-15 ENCOUNTER — Ambulatory Visit (INDEPENDENT_AMBULATORY_CARE_PROVIDER_SITE_OTHER): Payer: Medicare Other | Admitting: Podiatry

## 2014-09-15 ENCOUNTER — Ambulatory Visit: Payer: Medicare Other | Admitting: Neurology

## 2014-09-15 ENCOUNTER — Encounter: Payer: Self-pay | Admitting: Podiatry

## 2014-09-15 DIAGNOSIS — B351 Tinea unguium: Secondary | ICD-10-CM

## 2014-09-15 DIAGNOSIS — E1140 Type 2 diabetes mellitus with diabetic neuropathy, unspecified: Secondary | ICD-10-CM

## 2014-09-15 DIAGNOSIS — E1149 Type 2 diabetes mellitus with other diabetic neurological complication: Secondary | ICD-10-CM

## 2014-09-15 DIAGNOSIS — M2041 Other hammer toe(s) (acquired), right foot: Secondary | ICD-10-CM | POA: Diagnosis not present

## 2014-09-15 DIAGNOSIS — M2042 Other hammer toe(s) (acquired), left foot: Secondary | ICD-10-CM | POA: Diagnosis not present

## 2014-09-15 DIAGNOSIS — M79673 Pain in unspecified foot: Secondary | ICD-10-CM

## 2014-09-15 DIAGNOSIS — R52 Pain, unspecified: Secondary | ICD-10-CM

## 2014-09-15 NOTE — Progress Notes (Signed)
Patient ID: Tyler Lester, male   DOB: 1945-12-28, 69 y.o.   MRN: [id number]  Subjective: 69 y.o.-year-old male returns the office today for painful, elongated, thickened toenails which he is unable to trim himself. He also presents to pick up diabetic shoes. Denies any redness or drainage around the nails. Denies any acute changes since last appointment and no new complaints today. Denies any systemic complaints such as fevers, chills, nausea, vomiting.   Objective: AAO 3, NAD DP/PT pulses decreased, CRT less than 3 seconds Protective sensation decreased with Simms Weinstein monofilament, Achilles tendon reflex intact.  Nails hypertrophic, dystrophic, elongated, brittle, discolored 10. There is tenderness overlying the nails 1-5 bilaterally. There is no surrounding erythema or drainage along the nail sites. No open lesions or pre-ulcerative lesions are identified bilaterally No other areas of tenderness bilateral lower extremities. No overlying edema, erythema, increased warmth. No pain with calf compression, swelling, warmth, erythema.  Assessment: Patient presents with symptomatic onychomycosis; no signs of infection at this time.   Plan: -Treatment options including alternatives, risks, complications were discussed -Nails sharply debrided 10 without complication/bleeding. -Diabetic shoes were dispensed the patient. Appropriate break-in instructions were discussed the patient. See signs or symptoms of redness or any problems that she is to hold off on wearing them a call the office medially. -Discussed daily foot inspection. If there are any changes, to call the office immediately.  -Follow-up in 3 months or sooner if any problems are to arise. In the meantime, encouraged to call the office with any questions, concerns, changes symptoms.   Celesta Gentile, DPM

## 2014-09-17 ENCOUNTER — Ambulatory Visit: Payer: Medicare Other | Admitting: Podiatry

## 2014-09-22 ENCOUNTER — Telehealth: Payer: Self-pay | Admitting: Family Medicine

## 2014-09-22 NOTE — Telephone Encounter (Signed)
Pt called because we ordered a walker for the patient but he has no way of going to [facility] to get this and was hoping we can have [facility] deliver this on Monday or Tuesday of next week since he will have money on the 1st. jw

## 2014-09-22 NOTE — Telephone Encounter (Signed)
Spoke with [facility] representative.   They would not be able to deliver because pt would have to pay $75 out of pocket, they can however have it shipped for free.  They will go ahead and get this set up.  Pt informed and very appreciative.  He is to call back next Friday if he has not received. Khalani Novoa, Salome Spotted

## 2014-09-23 ENCOUNTER — Ambulatory Visit: Payer: Medicare Other | Admitting: Family Medicine

## 2014-09-24 ENCOUNTER — Ambulatory Visit (INDEPENDENT_AMBULATORY_CARE_PROVIDER_SITE_OTHER): Payer: Medicare Other | Admitting: Family Medicine

## 2014-09-24 VITALS — BP 157/81 | HR 74 | Temp 97.9°F | Ht 69.0 in | Wt 189.2 lb

## 2014-09-24 DIAGNOSIS — M5441 Lumbago with sciatica, right side: Secondary | ICD-10-CM

## 2014-09-24 DIAGNOSIS — I10 Essential (primary) hypertension: Secondary | ICD-10-CM | POA: Diagnosis not present

## 2014-09-24 DIAGNOSIS — G8929 Other chronic pain: Secondary | ICD-10-CM | POA: Diagnosis not present

## 2014-09-24 MED ORDER — HYDROCODONE-ACETAMINOPHEN 7.5-325 MG PO TABS: 1.0000 | ORAL_TABLET | Freq: Three times a day (TID) | ORAL | Status: DC | PRN

## 2014-09-24 NOTE — Patient Instructions (Signed)
Thank you for coming to see me today. It was a pleasure. Today we talked about:   Pain: I will refill your Norco  High blood pressure: please take your medication. I will recheck next visit  Please make an appointment to see me in one month for follow-up and physical.  If you have any questions or concerns, please do not hesitate to call the office at [phone].  Sincerely,  Cordelia Poche, MD

## 2014-09-24 NOTE — Progress Notes (Signed)
    Subjective    Tyler Lester is a 69 y.o. male that presents for a follow-up visit for chronic issues.   1. Hypertension: Patient not adherent with hctz 25mg  (he did not take it today) and lisinopril 40mg  because he ran out. No chest pain or shortness of breath. He is picking up his lisinopril today.  2. Right hip pain with radiculopathy: Chronic. Norco helps with pain and allows him to function. No falls.  3. Healthcare maintenance: Patient states he has had a colonoscopy about 1-2 years ago but does not remember who performed it. He states he was told he did not have to come back in for 5 years. He states that he was told he did not have cancer.  History  Substance Use Topics  . Smoking status: Never Smoker   . Smokeless tobacco: Never Used  . Alcohol Use: Yes     Comment: rare alcohol /beer    Allergies  Allergen Reactions  . Poison Ivy Treatments Hives    No orders of the defined types were placed in this encounter.    ROS  Per HPI   Objective   BP 157/81 mmHg  Pulse 74  Temp(Src) 97.9 F (36.6 C) (Oral)  Ht 5\' 9"  (1.753 m)  Wt 189 lb 3.2 oz (85.821 kg)  BMI 27.93 kg/m2  General: Well appearing  Assessment and Plan   Please refer to problem based charting of assessment and plan

## 2014-09-25 NOTE — Assessment & Plan Note (Signed)
Readdressed adherence to medication regimen. Patient states he will pick up his refills today. Recheck at next visit

## 2014-09-25 NOTE — Assessment & Plan Note (Signed)
Refill Norco

## 2014-09-27 LAB — HM DIABETES EYE EXAM

## 2014-10-05 ENCOUNTER — Ambulatory Visit: Payer: Medicare Other | Admitting: Neurology

## 2014-10-08 ENCOUNTER — Ambulatory Visit (INDEPENDENT_AMBULATORY_CARE_PROVIDER_SITE_OTHER): Payer: Medicare Other | Admitting: Student

## 2014-10-08 ENCOUNTER — Encounter: Payer: Self-pay | Admitting: Student

## 2014-10-08 VITALS — BP 173/82 | HR 72 | Temp 97.9°F | Ht 69.0 in | Wt 190.5 lb

## 2014-10-08 DIAGNOSIS — R05 Cough: Secondary | ICD-10-CM

## 2014-10-08 DIAGNOSIS — N39490 Overflow incontinence: Secondary | ICD-10-CM | POA: Diagnosis not present

## 2014-10-08 DIAGNOSIS — R059 Cough, unspecified: Secondary | ICD-10-CM | POA: Insufficient documentation

## 2014-10-08 DIAGNOSIS — R159 Full incontinence of feces: Secondary | ICD-10-CM | POA: Diagnosis not present

## 2014-10-08 DIAGNOSIS — R053 Chronic cough: Secondary | ICD-10-CM

## 2014-10-08 MED ORDER — DOCUSATE SODIUM 100 MG PO CAPS: 100.0000 mg | ORAL_CAPSULE | Freq: Two times a day (BID) | ORAL | Status: DC

## 2014-10-08 MED ORDER — SENNA 8.6 MG PO TABS: 1.0000 | ORAL_TABLET | Freq: Two times a day (BID) | ORAL | Status: DC

## 2014-10-08 NOTE — Patient Instructions (Addendum)
It was great seeing you today! We have addressed the following issues today  1. Diarrhea: this is likely from constipation. I have ordered an X-ray of your abdomen. I have also sent a presecription for two medication to take. 2. Cough: we will be getting an X-ray of your chest. I have already placed an order for this. 3. We will see you in about two weeks to make sure that things have improved. 4. Things to watch: weakness in your legs, numbness in your legs, fever, urinary incontinence or severe back pain. If you notice any of these, please come back soon or go to hospital.   If we did any lab work today, and the results require attention, either me or my nurse will get in touch with you. Otherwise, I look forward to talking with you again at our next visit. If you have any questions or concerns before then, please call the clinic at [phone].  Please bring all your medications to every doctors visit   Sign up for My Chart to have easy access to your labs results, and communication with your Primary care physician.    Please check-out at the front desk before leaving the clinic.   Take Care,   Dr. Wendee Beavers

## 2014-10-08 NOTE — Progress Notes (Signed)
   Subjective:    Patient ID: Tyler Lester, male    DOB: 02/01/1946, 69 y.o.   MRN: [id number]  HPI Diarrhea: urge, not giving him time to get to bath room. Sense of incomplete evacuation. Denies straining. This has been going on for two weeks. About the same over the course of two weeks. BM every 2-3 days. Last BM 3-days ago.  Reports a lot of gas. Mixed Consistency with well formed and watery stool. Brownish in color. No blood. Denies fever, chills, N/V, abdominal pain, urinary incontinence, saddle parasthesia or numbness in his legs. Reports back pain and weakness in his legs for 40 years after fall from a truck. Denies new changes or worsening of weakness in his legs. No recent antibiotic. He has been on Norco for long time. Hasn't been taking stool softener. Previously on Senna. Doesn't remember when he took it last. No sick contact.  Cough: productive. Whitish, thick. More in the morning. No hemoptysis. Has sx for a year but worried that this may get to his lung. Never smoked. 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 No reflux or epigastric pain. Denies sour taste in his mouth. Has hx of GERD. Feels gassy sometime. Denies hx of rhinitis. No chest pain, SOB & no night sweat. He is on Lisinopril 40 which he hasn't taken in for the last three days because he didn't have $3 to pick.   Review of Systems    Objective:   Physical Exam Gen: NAD, well-appearing, slow speech  Nares: clear, no erythema, swelling or congestion Oropharynx: clear, moist CV: RRR. S1 & S2 audible, no murmurs, rubs. 2+ radial and DP pulses bilaterally. Resp: no apparent WOB, CTAB. Abd: ++BS. Soft, NDNT, no mass Rectal exam: patient denied Ext: No edema or gross deformities. Neuro: LE: 5/5 at hip, knee and ankle bilaterally. Intact sensation over all his dermatomes. Diminished knee reflex bilaterally     Assessment & Plan:  See problem list

## 2014-10-08 NOTE — Assessment & Plan Note (Signed)
Productive cough for about a year. More in the morning when he wakes up. No hemoptysis or constitutional symptoms. Denies SOB or chest pain. Has hx of GERD although he denies reflux or epigastric pain now. He is also on ACEi for blood pressure. Oropharynx appears normal. Lung and heart exams unremarkable. Normal CXR about 8 months ago. -Will get CXR along KUB today -Won't start trial of PPI given his diarrhea now -Follow up with PCP in two weeks. May consider switching his ACEi to ARB's if no improvement.

## 2014-10-08 NOTE — Assessment & Plan Note (Addendum)
Likely overflow incontinence given hx of opiate use, PD, DM and description of his sxs. Negative for red flags (urinary incontinence, saddle paraesthesia, fever, neurologic symptoms in his legs). Neurologic exam unremarkable. Patient denied rectal exam. Less likely to be infectious. -Will get KUB -Senna -Docusate -Recommend fleet enema at home -Follow up with PCP in two weeks

## 2014-10-19 ENCOUNTER — Encounter: Payer: Self-pay | Admitting: Neurology

## 2014-10-19 ENCOUNTER — Ambulatory Visit (INDEPENDENT_AMBULATORY_CARE_PROVIDER_SITE_OTHER): Payer: Medicare Other | Admitting: Neurology

## 2014-10-19 VITALS — BP 151/80 | HR 78 | Ht 69.0 in | Wt 192.4 lb

## 2014-10-19 DIAGNOSIS — G20 Parkinson's disease: Secondary | ICD-10-CM | POA: Diagnosis not present

## 2014-10-19 MED ORDER — ROTIGOTINE 4 MG/24HR TD PT24: 1.0000 | MEDICATED_PATCH | Freq: Every day | TRANSDERMAL | Status: DC

## 2014-10-19 NOTE — Patient Instructions (Signed)
Overall you are doing fairly well but I do want to suggest a few things today:   Remember to drink plenty of fluid, eat healthy meals and do not skip any meals. Try to eat protein with a every meal and eat a healthy snack such as fruit or nuts in between meals. Try to keep a regular sleep-wake schedule and try to exercise daily, particularly in the form of walking, 20-30 minutes a day, if you can.   As far as your medications are concerned, I would like to suggest:  Start with Neupro 2mg  daily. In 2 weeks can increase to Neupro 4mg  daily.   I would like to see you back in 4 weeks, sooner if we need to. Please call us with any interim questions, concerns, problems, updates or refill requests.   Please also call us for any test results so we can go over those with you on the phone.  My clinical assistant and will answer any of your questions and relay your messages to me and also relay most of my messages to you.   Our phone number is [phone]. We also have an after hours call service for urgent matters and there is a physician on-call for urgent questions. For any emergencies you know to call 911 or go to the nearest emergency room

## 2014-10-19 NOTE — Progress Notes (Signed)
[facility]   Provider: Dr Jaynee Eagles Referring Provider: Cordelia Poche, MD Primary Care Physician: Cordelia Poche, MD  CC: Tremor  HPI: Tyler Lester is a 69 y.o. male here as a follow up for tremor. Memory problems started years ago. The tremor is more at rest. And the jaw trembles as well. He says he is still taking the medication I gave him and he says it is not helping. Feels like his voice is getting softer. He has vivid dreams. He has drool on his pillow. He has fallen a few times and he catches himslef. He has loss of smell and taste. He can't smell dinner. He is swallowing OK. His handwriting has gotten smaller, when he writes memos he has noticed he has gotten smaller.   Interval History 03/22/2014: the tremor continues. He appears to be a poor historian and doesn't quite know what medications he is taking. He is not having any side effects from the Azilect and says he believes he is taking it. He also has neck pain and low back pain. He is on pain medication and is waiting to be seen by pain management. He had recent cervical spine surgery and has had lumber spine surgery in the past. He is having shooting pain from low back into right leg that is chronic and he is being managed by orthopaedic doctor. The orthopedist cancelled his pain medication because he was already getting vicodin from dr. Teryl Lucy and patient wants to know if I can give him the percocet instead of vicodin. He is only taking 200mg  three times daily (at first he said just once daily) of neurontin. No side effects from the gabapentin.   02/03/2014: Tyler Lester is a 69 y.o. male here as a referral from Dr. Lonny Prude for Tremor. PMHx cervical fusion (September), chronic back pain, DM, HTN, fatigue, depression, anxiety, myelopathy.   Was sent here by Dr. Teryl Lucy for evaluation of tremor. He has a tremor in his left hand and his jaw. Tremor has only been going on for at least a year but worse in the last 3  months. Getting worse after neck surgery. Worse in the left but also in the right hand as well as the chin. He can't walk, he has chronic pain in his low back and neck. Walking slow, doesn't have energy. No tremors in other family members. Doesn't notice the tremor getting better with alcohol. No constipation. Can hardly smell. Sometimes he is talking low, softer. Hand writing is shaky, smaller. Feels weak. TSH was normal in June. Does not drink caffeine. 1-2 drinks a week, no significant alcohol. No FHx of parkinson's or neurodegenerative disease. Has had falls.  Review of Systems: Patient complains of symptoms per HPI as well as the following symptoms: no cp, no sob. Pertinent negatives per HPI. All others negative.   Social History   Social History  . Marital Status: Single    Spouse Name: N/A  . Number of Children: 2  . Years of Education: 12   Occupational History  . Retired    . Disability    Social History Main Topics  . Smoking status: Never Smoker   . Smokeless tobacco: Never Used  . Alcohol Use: Yes     Comment: rare alcohol /beer  . Drug Use: No  . Sexual Activity: Not on file   Other Topics Concern  . Not on file   Social History Narrative   Patient lives at home alone.    Patient have  2 children.    Patient has a high school education    Patient is right handed.    Patient is retired on disability.        Family History  Problem Relation Age of Onset  . Cancer - Other Other   . Diabetes Other   . Hypertension Other     Past Medical History  Diagnosis Date  . Diabetes mellitus   . Chronic pain   . Hypertension   . Hyperlipidemia   . Sleep apnea   . Shortness of breath   . Inguinal hernia     right  . Pneumonia   . Numbness     T/O  . Bilateral foot pain     Past Surgical History  Procedure Laterality Date  . Colon surgery    . Cataract extraction w/ intraocular lens implant Left 06/2012    laser for posterior capsule?  Marland Kitchen Tonsillectomy    .  Posterior cervical fusion/foraminotomy N/A 11/12/2013    Procedure: Posterior cervical decompression fusion, cervical 3-4, cervical 4-5, cervical 5-6, cervical 6-7 with instrumentation and allograft   (LEVEL 4);  Surgeon: Sinclair Ship, MD;  Location: [facility];  Service: Orthopedics;  Laterality: N/A;  Posterior cervical decompression fusion, cervical 3-4, cervical 4-5, cervical 5-6, cervical 6-7 with instrumentation and allograft    Current Outpatient Prescriptions  Medication Sig Dispense Refill  . atorvastatin (LIPITOR) 20 MG tablet Take 1 tablet (20 mg total) by mouth daily. 90 tablet 3  . Cholecalciferol (VITAMIN D3) 400 UNITS tablet Take 2 tablets (800 Units total) by mouth daily. 60 tablet 1  . finasteride (PROSCAR) 5 MG tablet Take 5 mg by mouth daily.  0  . FLUoxetine (PROZAC) 20 MG capsule     . gabapentin (NEURONTIN) 300 MG capsule Take 1 capsule (300 mg total) by mouth 3 (three) times daily. May increase to 2 capsules (600mg  total) 3 times daily as needed. (Patient taking differently: Take 300 mg by mouth 2 (two) times daily. May increase to 2 capsules (600mg  total) 3 times daily as needed.) 180 capsule 11  . hydrochlorothiazide (HYDRODIURIL) 25 MG tablet Take 25 mg by mouth daily.    Marland Kitchen HYDROcodone-acetaminophen (NORCO) 7.5-325 MG per tablet Take 1 tablet by mouth every 8 (eight) hours as needed for moderate pain. 90 tablet 0  . lisinopril (PRINIVIL,ZESTRIL) 40 MG tablet Take 1 tablet (40 mg total) by mouth daily. (Patient taking differently: Take 20 mg by mouth daily. ) 30 tablet 2  . rasagiline (AZILECT) 1 MG TABS tablet Take 1 tablet (1 mg total) by mouth daily. 90 tablet 6  . tamsulosin (FLOMAX) 0.4 MG CAPS capsule Take 1 capsule (0.4 mg total) by mouth daily after supper. 30 capsule 0  . venlafaxine XR (EFFEXOR-XR) 37.5 MG 24 hr capsule Take 37.5 mg by mouth daily.  0  . docusate sodium (COLACE) 100 MG capsule Take 1 capsule (100 mg total) by mouth 2 (two) times daily. (Patient  not taking: Reported on 10/19/2014) 10 capsule 0  . ipratropium (ATROVENT) 0.06 % nasal spray Place 2 sprays into both nostrils 4 (four) times daily. (Patient not taking: Reported on 10/19/2014) 15 mL 12  . senna (SENOKOT) 8.6 MG TABS tablet Take 1 tablet (8.6 mg total) by mouth 2 (two) times daily. (Patient not taking: Reported on 10/19/2014) 120 each 0  . silver sulfADIAZINE (SILVADENE) 1 % cream Apply 1 application topically daily. (Patient not taking: Reported on 10/19/2014) 50 g 0   No current  facility-administered medications for this visit.    Allergies as of 10/19/2014 - Review Complete 10/08/2014  Allergen Reaction Noted  . Poison ivy treatments Hives 05/14/2014    Vitals: BP 151/80 mmHg  Pulse 78  Ht 5\' 9"  (1.753 m)  Wt 192 lb 6.4 oz (87.272 kg)  BMI 28.40 kg/m2 Last Weight:  Wt Readings from Last 1 Encounters:  10/19/14 192 lb 6.4 oz (87.272 kg)   Last Height:   Ht Readings from Last 1 Encounters:  10/19/14 5\' 9"  (1.753 m)   Speech: Mildly hypophonic. No aphasia, not dysarthric Cognition:  The patient is oriented to person, place, and time;  Cranial Nerves:  The pupils are equal, round, and reactive to light. Visual fields are full to finger confrontation. Extraocular movements are intact. Trigeminal sensation is intact and the muscles of mastication are normal. The face is symmetric. The palate elevates in the midline. Voice is normal. Shoulder shrug is normal. The tongue has normal motion without fasciculations.   Coordination: Bradykinesias left > right finger tapping, slow but intact FTN, no dysmetria  Gait: antalgic. Difficult to assess for shuffling and arm swing because he has chronic pain, recent neck surgery, chronic low back pain and uses a cane.   Motor Observation: Left>R resting tremor and chin tremor. Decreased blink. Postural tremor.    Tone: Mild cogwheeling with facilitation.  Assessment/Plan: 69 year old male here for evaluation of  tremor. PMHx cervical fusion (September), chronic back pain, DM, HTN, fatigue, depression, anxiety, myelopathy. Has some parkinsonian features, left > right resting tremor, bradykinesia left > right on finger tapping, mild hypophonia, masked facies. Mild cogwheeling with facilitation. Antalgic gait with small strides however difficult to assess for shuffling or arm swing because he has chronic pain, recent neck surgery, chronic low back pain, neuropathy and uses a cane.   He is a poor historian and I am not entirely convinced he knows which medications he is taking. I have asked him to bring his medications with him next time but he forget them today. Can continue the Azilect. Will try Neupro patch, discussed side effects including hypotension, dizziness, GI upset, compulsive behavior. Stop for any side effect.    MRI of the brain - ordered but he did not have it done  Will continue Azilect and start Neupro - for Azilect Please avoid the following medications while using Azilect:dextromethorphan (can be found in cough medicines), Luvox (fluvoxamine), Prozac (fluoxetine), Flexeril (cyclobenzaprine), Darvon (propoxyphene), St. John's Wort, Tramadol, Methadone, and Demerol (meperidine). If you need to take ciprofloxacin for an infection, cut down the Azilect to  tab prior to starting the cipro  Memory loss - asked him to come back in 4 weeks with a relative or friend who knows him well so we can discuss memory loss and how to help him better.    Sarina Ill, MD  [facility] [address] [place], [place] [address]  Phone [phone] Fax [phone]  A total of 30 minutes was spent face-to-face with this patient. Over half this time was spent on counseling patient on the Parkinson's diagnosis and different diagnostic and therapeutic options available.

## 2014-10-25 ENCOUNTER — Encounter (HOSPITAL_COMMUNITY): Payer: Self-pay | Admitting: Emergency Medicine

## 2014-10-25 ENCOUNTER — Emergency Department (HOSPITAL_COMMUNITY)
Admission: EM | Admit: 2014-10-25 | Discharge: 2014-10-25 | Disposition: A | Discharge status: Home / Self Care | Payer: Medicare Other | Attending: Emergency Medicine | Admitting: Emergency Medicine

## 2014-10-25 DIAGNOSIS — Z79899 Other long term (current) drug therapy: Secondary | ICD-10-CM | POA: Insufficient documentation

## 2014-10-25 DIAGNOSIS — E785 Hyperlipidemia, unspecified: Secondary | ICD-10-CM | POA: Diagnosis not present

## 2014-10-25 DIAGNOSIS — Z8701 Personal history of pneumonia (recurrent): Secondary | ICD-10-CM | POA: Insufficient documentation

## 2014-10-25 DIAGNOSIS — Z8669 Personal history of other diseases of the nervous system and sense organs: Secondary | ICD-10-CM | POA: Insufficient documentation

## 2014-10-25 DIAGNOSIS — T839XXA Unspecified complication of genitourinary prosthetic device, implant and graft, initial encounter: Secondary | ICD-10-CM

## 2014-10-25 DIAGNOSIS — E119 Type 2 diabetes mellitus without complications: Secondary | ICD-10-CM | POA: Insufficient documentation

## 2014-10-25 DIAGNOSIS — Z466 Encounter for fitting and adjustment of urinary device: Secondary | ICD-10-CM | POA: Diagnosis not present

## 2014-10-25 DIAGNOSIS — I10 Essential (primary) hypertension: Secondary | ICD-10-CM | POA: Diagnosis not present

## 2014-10-25 DIAGNOSIS — G8929 Other chronic pain: Secondary | ICD-10-CM | POA: Diagnosis not present

## 2014-10-25 DIAGNOSIS — Z8719 Personal history of other diseases of the digestive system: Secondary | ICD-10-CM | POA: Diagnosis not present

## 2014-10-25 DIAGNOSIS — R339 Retention of urine, unspecified: Secondary | ICD-10-CM | POA: Diagnosis not present

## 2014-10-25 LAB — URINE MICROSCOPIC-ADD ON

## 2014-10-25 LAB — CBC
HCT: 47.5 % (ref 39.0–52.0)
Hemoglobin: 16.2 g/dL (ref 13.0–17.0)
MCH: 29.8 pg (ref 26.0–34.0)
MCHC: 34.1 g/dL (ref 30.0–36.0)
MCV: 87.5 fL (ref 78.0–100.0)
Platelets: 181 10*3/uL (ref 150–400)
RBC: 5.43 MIL/uL (ref 4.22–5.81)
RDW: 12.6 % (ref 11.5–15.5)
WBC: 5.7 10*3/uL (ref 4.0–10.5)

## 2014-10-25 LAB — URINALYSIS, ROUTINE W REFLEX MICROSCOPIC
Bilirubin Urine: NEGATIVE
Glucose, UA: 100 mg/dL — AB
Ketones, ur: NEGATIVE mg/dL
Leukocytes, UA: NEGATIVE
Nitrite: NEGATIVE
Protein, ur: 100 mg/dL — AB
Specific Gravity, Urine: 1.009 (ref 1.005–1.030)
Urobilinogen, UA: 0.2 mg/dL (ref 0.0–1.0)
pH: 7 (ref 5.0–8.0)

## 2014-10-25 LAB — BASIC METABOLIC PANEL
Anion gap: 6 (ref 5–15)
BUN: 9 mg/dL (ref 6–20)
CO2: 30 mmol/L (ref 22–32)
Calcium: 9.8 mg/dL (ref 8.9–10.3)
Chloride: 105 mmol/L (ref 101–111)
Creatinine, Ser: 1.15 mg/dL (ref 0.61–1.24)
GFR calc Af Amer: >60 mL/min (ref >60)
GFR calc non Af Amer: >60 mL/min (ref >60)
Glucose, Bld: 176 mg/dL — ABNORMAL HIGH (ref 65–99)
Potassium: 2.9 mmol/L — ABNORMAL LOW (ref 3.5–5.1)
Sodium: 141 mmol/L (ref 135–145)

## 2014-10-25 MED ORDER — POTASSIUM CHLORIDE CRYS ER 20 MEQ PO TBCR: 20.0000 meq | EXTENDED_RELEASE_TABLET | Freq: Every day | ORAL | Status: DC

## 2014-10-25 MED ORDER — POTASSIUM CHLORIDE CRYS ER 20 MEQ PO TBCR
40.0000 meq | EXTENDED_RELEASE_TABLET | Freq: Once | ORAL | Status: AC
  Administered 2014-10-25: 40 meq via ORAL
  Filled 2014-10-25: qty 2

## 2014-10-25 NOTE — ED Provider Notes (Addendum)
CSN: [id number]     Arrival date & time 10/25/14  7341 History   First MD Initiated Contact with Patient 10/25/14 [phone]     Chief Complaint  Patient presents with  . Urinary Retention     (Consider location/radiation/quality/duration/timing/severity/associated sxs/prior Treatment) HPI Comments: Patient is a 69 yo M PMHx significant for DM, HTN, HLD, BPH presenting to the ED for evaluation of acute urinary retention since this morning. Patient states he awoke and felt the urge to urinate, but could not. He endorses suprapubic discomfort and fullness. He states he urinated a small amount. He had similar incidence approximately one year ago, had a foley placed and saw urology. He has been doing well since being started on flomax. Denies any fevers, vomiting, diarrhea, CP, SOB, dysuria.    Past Medical History  Diagnosis Date  . Diabetes mellitus   . Chronic pain   . Hypertension   . Hyperlipidemia   . Sleep apnea   . Shortness of breath   . Inguinal hernia     right  . Pneumonia   . Numbness     T/O  . Bilateral foot pain    Past Surgical History  Procedure Laterality Date  . Colon surgery    . Cataract extraction w/ intraocular lens implant Left 06/2012    laser for posterior capsule?  Marland Kitchen Tonsillectomy    . Posterior cervical fusion/foraminotomy N/A 11/12/2013    Procedure: Posterior cervical decompression fusion, cervical 3-4, cervical 4-5, cervical 5-6, cervical 6-7 with instrumentation and allograft   (LEVEL 4);  Surgeon: Sinclair Ship, MD;  Location: [facility];  Service: Orthopedics;  Laterality: N/A;  Posterior cervical decompression fusion, cervical 3-4, cervical 4-5, cervical 5-6, cervical 6-7 with instrumentation and allograft   Family History  Problem Relation Age of Onset  . Cancer - Other Other   . Diabetes Other   . Hypertension Other    Social History  Substance Use Topics  . Smoking status: Never Smoker   . Smokeless tobacco: Never Used  . Alcohol Use: Yes      Comment: rare alcohol /beer    Review of Systems  Gastrointestinal: Positive for abdominal pain.  Genitourinary:       + Urinary retention  All other systems reviewed and are negative.     Allergies  Poison ivy treatments  Home Medications   Prior to Admission medications   Medication Sig Start Date End Date Taking? Authorizing Provider  atorvastatin (LIPITOR) 20 MG tablet Take 1 tablet (20 mg total) by mouth daily. 04/08/13   Kandis Nab, MD  Cholecalciferol (VITAMIN D3) 400 UNITS tablet Take 2 tablets (800 Units total) by mouth daily. 03/10/12   Vivi Ferns, MD  docusate sodium (COLACE) 100 MG capsule Take 1 capsule (100 mg total) by mouth 2 (two) times daily. Patient not taking: Reported on 10/19/2014 10/08/14   Mercy Riding, MD  finasteride (PROSCAR) 5 MG tablet Take 5 mg by mouth daily. 09/15/14   Historical Provider, MD  FLUoxetine (PROZAC) 20 MG capsule  07/04/14   Historical Provider, MD  gabapentin (NEURONTIN) 300 MG capsule Take 1 capsule (300 mg total) by mouth 3 (three) times daily. May increase to 2 capsules (600mg  total) 3 times daily as needed. Patient taking differently: Take 300 mg by mouth 2 (two) times daily. May increase to 2 capsules (600mg  total) 3 times daily as needed. 03/22/14   Melvenia Beam, MD  hydrochlorothiazide (HYDRODIURIL) 25 MG tablet Take 25 mg by  mouth daily.    Historical Provider, MD  HYDROcodone-acetaminophen (NORCO) 7.5-325 MG per tablet Take 1 tablet by mouth every 8 (eight) hours as needed for moderate pain. 09/24/14   Mariel Aloe, MD  ipratropium (ATROVENT) 0.06 % nasal spray Place 2 sprays into both nostrils 4 (four) times daily. Patient not taking: Reported on 10/19/2014 04/30/13   Kandis Nab, MD  lisinopril (PRINIVIL,ZESTRIL) 40 MG tablet Take 1 tablet (40 mg total) by mouth daily. Patient taking differently: Take 20 mg by mouth daily.  08/31/14   Mariel Aloe, MD  potassium chloride SA (K-DUR,KLOR-CON) 20 MEQ tablet Take 1  tablet (20 mEq total) by mouth daily. 10/25/14   Jakaden Ouzts, PA-C  rasagiline (AZILECT) 1 MG TABS tablet Take 1 tablet (1 mg total) by mouth daily. 03/22/14   Melvenia Beam, MD  rotigotine (NEUPRO) 4 MG/24HR Place 1 patch onto the skin daily. Use one patch daily as instructed 10/19/14   Melvenia Beam, MD  senna (SENOKOT) 8.6 MG TABS tablet Take 1 tablet (8.6 mg total) by mouth 2 (two) times daily. Patient not taking: Reported on 10/19/2014 10/08/14   Mercy Riding, MD  silver sulfADIAZINE (SILVADENE) 1 % cream Apply 1 application topically daily. Patient not taking: Reported on 10/19/2014 06/09/14   Trula Slade, DPM  tamsulosin (FLOMAX) 0.4 MG CAPS capsule Take 1 capsule (0.4 mg total) by mouth daily after supper. 03/25/14   Charlesetta Shanks, MD  venlafaxine XR (EFFEXOR-XR) 37.5 MG 24 hr capsule Take 37.5 mg by mouth daily. 10/16/14   Historical Provider, MD   BP 153/88 mmHg  Pulse 88  Temp(Src) 97.8 F (36.6 C) (Oral)  Resp 18  SpO2 95% Physical Exam  Constitutional: He is oriented to person, place, and time. He appears well-developed and well-nourished. No distress.  HENT:  Head: Normocephalic and atraumatic.  Right Ear: External ear normal.  Left Ear: External ear normal.  Nose: Nose normal.  Mouth/Throat: Oropharynx is clear and moist.  Eyes: Conjunctivae are normal.  Neck: Normal range of motion. Neck supple.  No nuchal rigidity.   Cardiovascular: Normal rate, regular rhythm and normal heart sounds.   Pulmonary/Chest: Effort normal and breath sounds normal.  Abdominal: Soft. There is tenderness (suprapubic discomfort).  Musculoskeletal: Normal range of motion.  Neurological: He is alert and oriented to person, place, and time.  Skin: Skin is warm and dry. He is not diaphoretic.  Psychiatric: He has a normal mood and affect.  Nursing note and vitals reviewed.   ED Course  Procedures (including critical care time) Medications  potassium chloride SA (K-DUR,KLOR-CON)  CR tablet 40 mEq (40 mEq Oral Given 10/25/14 0826)    Labs Review Labs Reviewed  URINALYSIS, ROUTINE W REFLEX MICROSCOPIC (NOT AT [facility]) - Abnormal; Notable for the following:    Glucose, UA 100 (*)    Hgb urine dipstick LARGE (*)    Protein, ur 100 (*)    All other components within normal limits  BASIC METABOLIC PANEL - Abnormal; Notable for the following:    Potassium 2.9 (*)    Glucose, Bld 176 (*)    All other components within normal limits  URINE CULTURE  CBC  URINE MICROSCOPIC-ADD ON    Imaging Review No results found. I have personally reviewed and evaluated these images and lab results as part of my medical decision-making.   EKG Interpretation None      8:00 AM Patient feeling better s/p foley placement.   MDM  Final diagnoses:  Urinary retention    Filed Vitals:   10/25/14 0915  BP: 153/88  Pulse: 88  Temp:   Resp:    Afebrile, NAD, non-toxic appearing, AAOx4.   Patient presenting with recurrent urinary retention. Suprapubic discomfort noted. > 500 mL On bladder scan performed by RN staff in ED. Will insert foley catheter. Creatinine and UA checked. Creatinine wnl. Large hgb on UA, likely secondary to catheter placement. No evidence of UTI. Symptoms improved after foley placement. Will send back to urology for re-evaluation of recurrent retention. Patient is agreeable to plan. Patient is stable at time of discharge   Patient d/w with Dr. Dina Rich, agrees with plan.    Baron Sane, PA-C 10/25/14 [place], MD 10/25/14 [address], PA-C 11/12/14 2110  Merryl Hacker, MD 11/15/14 [phone]

## 2014-10-25 NOTE — ED Notes (Signed)
EMS - Patient coming from home with c/o of urinary retention x 1 hour.  Patient has lower abdominal pressure.  Denies N/V/D.

## 2014-10-25 NOTE — Discharge Instructions (Signed)
Please follow up with your urologist at [facility] to schedule a follow up appointment. Please take the potassium pills as prescribed. Please read all discharge instructions and return precautions.    Acute Urinary Retention Acute urinary retention is the temporary inability to urinate. This is a common problem in older men. As men age their prostates become larger and block the flow of urine from the bladder. This is usually a problem that has come on gradually.  HOME CARE INSTRUCTIONS If you are sent home with a Foley catheter and a drainage system, you will need to discuss the best course of action with your health care provider. While the catheter is in, maintain a good intake of fluids. Keep the drainage bag emptied and lower than your catheter. This is so that contaminated urine will not flow back into your bladder, which could lead to a urinary tract infection. There are two main types of drainage bags. One is a large bag that usually is used at night. It has a good capacity that will allow you to sleep through the night without having to empty it. The second type is called a leg bag. It has a smaller capacity, so it needs to be emptied more frequently. However, the main advantage is that it can be attached by a leg strap and can go underneath your clothing, allowing you the freedom to move about or leave your home. Only take over-the-counter or prescription medicines for pain, discomfort, or fever as directed by your health care provider.  SEEK MEDICAL CARE IF:  You develop a low-grade fever.  You experience spasms or leakage of urine with the spasms. SEEK IMMEDIATE MEDICAL CARE IF:   You develop chills or fever.  Your catheter stops draining urine.  Your catheter falls out.  You start to develop increased bleeding that does not respond to rest and increased fluid intake. MAKE SURE YOU:  Understand these instructions.  Will watch your condition.  Will get help right away  if you are not doing well or get worse. Document Released: 05/21/2000 Document Revised: 02/17/2013 Document Reviewed: 07/24/2012 [facility] Patient Information 2015 [place], [place]. This information is not intended to replace advice given to you by your health care provider. Make sure you discuss any questions you have with your health care provider.

## 2014-10-25 NOTE — ED Notes (Signed)
Pt here this am and had foley placed; pt here to have regular bag changed out for leg bag

## 2014-10-25 NOTE — ED Provider Notes (Signed)
CSN: [id number]     Arrival date & time 10/25/14  1641 History  This chart was scribed for non-physician practitioner, Waynetta Pean, PA-C working with Sharlett Iles, MD by Tula Nakayama, ED scribe. This patient was seen in room TR07C/TR07C and the patient's care was started at 5:19 PM   Chief Complaint  Patient presents with  . needs leg bag    The history is provided by the patient. No language interpreter was used.   HPI Comments: Tyler Lester is a 69 y.o. male who presents to the Emergency Department for a leg bag for his foley catheter. Pt was seen earlier today for acute urinary retention. He was given a Foley catheter which has improved his symptoms. Pt denies current abdominal pain, nausea, vomiting, fever, chills, penile pain and testicular pain as associated symptoms.  Past Medical History  Diagnosis Date  . Diabetes mellitus   . Chronic pain   . Hypertension   . Hyperlipidemia   . Sleep apnea   . Shortness of breath   . Inguinal hernia     right  . Pneumonia   . Numbness     T/O  . Bilateral foot pain    Past Surgical History  Procedure Laterality Date  . Colon surgery    . Cataract extraction w/ intraocular lens implant Left 06/2012    laser for posterior capsule?  Marland Kitchen Tonsillectomy    . Posterior cervical fusion/foraminotomy N/A 11/12/2013    Procedure: Posterior cervical decompression fusion, cervical 3-4, cervical 4-5, cervical 5-6, cervical 6-7 with instrumentation and allograft   (LEVEL 4);  Surgeon: Sinclair Ship, MD;  Location: [facility];  Service: Orthopedics;  Laterality: N/A;  Posterior cervical decompression fusion, cervical 3-4, cervical 4-5, cervical 5-6, cervical 6-7 with instrumentation and allograft   Family History  Problem Relation Age of Onset  . Cancer - Other Other   . Diabetes Other   . Hypertension Other    Social History  Substance Use Topics  . Smoking status: Never Smoker   . Smokeless tobacco: Never Used  . Alcohol Use:  Yes     Comment: rare alcohol /beer    Review of Systems  Constitutional: Negative for fever and chills.  Gastrointestinal: Negative for nausea, vomiting and abdominal pain.  Genitourinary: Negative for hematuria, difficulty urinating, penile pain and testicular pain.    Allergies  Poison ivy treatments  Home Medications   Prior to Admission medications   Medication Sig Start Date End Date Taking? Authorizing Provider  atorvastatin (LIPITOR) 20 MG tablet Take 1 tablet (20 mg total) by mouth daily. 04/08/13   Kandis Nab, MD  Cholecalciferol (VITAMIN D3) 400 UNITS tablet Take 2 tablets (800 Units total) by mouth daily. 03/10/12   Vivi Ferns, MD  docusate sodium (COLACE) 100 MG capsule Take 1 capsule (100 mg total) by mouth 2 (two) times daily. Patient not taking: Reported on 10/19/2014 10/08/14   Mercy Riding, MD  finasteride (PROSCAR) 5 MG tablet Take 5 mg by mouth daily. 09/15/14   Historical Provider, MD  FLUoxetine (PROZAC) 20 MG capsule  07/04/14   Historical Provider, MD  gabapentin (NEURONTIN) 300 MG capsule Take 1 capsule (300 mg total) by mouth 3 (three) times daily. May increase to 2 capsules (600mg  total) 3 times daily as needed. Patient taking differently: Take 300 mg by mouth 2 (two) times daily. May increase to 2 capsules (600mg  total) 3 times daily as needed. 03/22/14   Melvenia Beam, MD  hydrochlorothiazide (HYDRODIURIL) 25 MG tablet Take 25 mg by mouth daily.    Historical Provider, MD  HYDROcodone-acetaminophen (NORCO) 7.5-325 MG per tablet Take 1 tablet by mouth every 8 (eight) hours as needed for moderate pain. 09/24/14   Mariel Aloe, MD  ipratropium (ATROVENT) 0.06 % nasal spray Place 2 sprays into both nostrils 4 (four) times daily. Patient not taking: Reported on 10/19/2014 04/30/13   Kandis Nab, MD  lisinopril (PRINIVIL,ZESTRIL) 40 MG tablet Take 1 tablet (40 mg total) by mouth daily. Patient taking differently: Take 20 mg by mouth daily.  08/31/14   Mariel Aloe, MD  potassium chloride SA (K-DUR,KLOR-CON) 20 MEQ tablet Take 1 tablet (20 mEq total) by mouth daily. 10/25/14   Jennifer Piepenbrink, PA-C  rasagiline (AZILECT) 1 MG TABS tablet Take 1 tablet (1 mg total) by mouth daily. 03/22/14   Melvenia Beam, MD  rotigotine (NEUPRO) 4 MG/24HR Place 1 patch onto the skin daily. Use one patch daily as instructed 10/19/14   Melvenia Beam, MD  senna (SENOKOT) 8.6 MG TABS tablet Take 1 tablet (8.6 mg total) by mouth 2 (two) times daily. Patient not taking: Reported on 10/19/2014 10/08/14   Mercy Riding, MD  silver sulfADIAZINE (SILVADENE) 1 % cream Apply 1 application topically daily. Patient not taking: Reported on 10/19/2014 06/09/14   Trula Slade, DPM  tamsulosin (FLOMAX) 0.4 MG CAPS capsule Take 1 capsule (0.4 mg total) by mouth daily after supper. 03/25/14   Charlesetta Shanks, MD  venlafaxine XR (EFFEXOR-XR) 37.5 MG 24 hr capsule Take 37.5 mg by mouth daily. 10/16/14   Historical Provider, MD   BP 163/84 mmHg  Pulse 95  Temp(Src) 98.4 F (36.9 C) (Oral)  Resp 18  SpO2 96% Physical Exam  Constitutional: He appears well-developed and well-nourished. No distress.  HENT:  Head: Normocephalic and atraumatic.  Eyes: Right eye exhibits no discharge. Left eye exhibits no discharge.  Pulmonary/Chest: Effort normal. No respiratory distress.  Genitourinary:  Foley catheter in place with a large foley catheter bag Straw-colored urine in bag  Neurological: He is alert. Coordination normal.  Skin: No rash noted. He is not diaphoretic.  Psychiatric: He has a normal mood and affect. His behavior is normal.  Nursing note and vitals reviewed.   ED Course  Procedures  DIAGNOSTIC STUDIES: Oxygen Saturation is 96% on RA, adequate by my interpretation.    COORDINATION OF CARE: 5:23 PM Discussed treatment plan with pt at bedside and pt agreed to plan.   Labs Review Labs Reviewed - No data to display  Imaging Review No results found.   EKG  Interpretation None      Filed Vitals:   10/25/14 1713  BP: 163/84  Pulse: 95  Temp: 98.4 F (36.9 C)  TempSrc: Oral  Resp: 18  SpO2: 96%     MDM   Meds given in ED:  Medications - No data to display  Discharge Medication List as of 10/25/2014  5:22 PM      Final diagnoses:  Foley catheter problem, initial encounter   This patient return to the emergency department today as he was sent home without a leg bag for his Foley catheter. Patient reports he would like a leg bag for his catheter. We'll provide him with a leg bag and keep his follow-up appointment with urology for this week. On exam patient is afebrile nontoxic appearing. His Foley catheter is in place with straw-colored urine in the bag. I advised the patient  to follow-up with their primary care provider this week. I advised the patient to return to the emergency department with new or worsening symptoms or new concerns. The patient verbalized understanding and agreement with plan.     I personally performed the services described in this documentation, which was scribed in my presence. The recorded information has been reviewed and is accurate.    Waynetta Pean, PA-C 10/25/14 1809  Sharlett Iles, MD 10/26/14 [phone]

## 2014-10-25 NOTE — ED Notes (Signed)
Pt stable, ambulatory, states understanding of discharge instructions 

## 2014-10-25 NOTE — ED Provider Notes (Signed)
  Face-to-face evaluation   History: Patient had difficulty voiding this morning, similar to prior. Probable about a year ago. He believes at that time. He saw a urologist and was started on medication. He denies fever, chills, nausea, vomiting, constipation, weakness or dizziness.  Physical exam: Alert, elderly man who is comfortable, post Foley catheterization. Abdomen soft. Rectal exam- normal perineal sensation and normal anal tone. Prostate enlarged, somewhat firm, and without palpable nodules. No fecal impaction or rectal mass. Brown stool in rectum.  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 Doubt prostatitis, or prostate cancer. He will require follow-up with urology in 1 week, and be discharged with Foley catheter.  Medical screening examination/treatment/procedure(s) were conducted as a shared visit with non-physician practitioner(s) and myself.  I personally evaluated the patient during the encounter  Daleen Bo, MD 10/25/14 [phone]

## 2014-10-25 NOTE — Discharge Instructions (Signed)
Foley Catheter Care °A Foley catheter is a soft, flexible tube that is placed into the bladder to drain urine. A Foley catheter may be inserted if: °· You leak urine or are not able to control when you urinate (urinary incontinence). °· You are not able to urinate when you need to (urinary retention). °· You had prostate surgery or surgery on the genitals. °· You have certain medical conditions, such as multiple sclerosis, dementia, or a spinal cord injury. °If you are going home with a Foley catheter in place, follow the instructions below. °TAKING CARE OF THE CATHETER °1. Wash your hands with soap and water. °2. Using mild soap and warm water on a clean washcloth: °· Clean the area on your body closest to the catheter insertion site using a circular motion, moving away from the catheter. Never wipe toward the catheter because this could sweep bacteria up into the urethra and cause infection. °· Remove all traces of soap. Pat the area dry with a clean towel. For males, reposition the foreskin. °3. Attach the catheter to your leg so there is no tension on the catheter. Use adhesive tape or a leg strap. If you are using adhesive tape, remove any sticky residue left behind by the previous tape you used. °4. Keep the drainage bag below the level of the bladder, but keep it off the floor. °5. Check throughout the day to be sure the catheter is working and urine is draining freely. Make sure the tubing does not become kinked. °6. Do not pull on the catheter or try to remove it. Pulling could damage internal tissues. °TAKING CARE OF THE DRAINAGE BAGS °You will be given two drainage bags to take home. One is a large overnight drainage bag, and the other is a smaller leg bag that fits underneath clothing. You may wear the overnight bag at any time, but you should never wear the smaller leg bag at night. Follow the instructions below for how to empty, change, and clean your drainage bags. °Emptying the Drainage Bag °You must  empty your drainage bag when it is  -½ full or at least 2-3 times a day. °1. Wash your hands with soap and water. °2. Keep the drainage bag below your hips, below the level of your bladder. This stops urine from going back into the tubing and into your bladder. °3. Hold the dirty bag over the toilet or a clean container. °4. Open the pour spout at the bottom of the bag and empty the urine into the toilet or container. Do not let the pour spout touch the toilet, container, or any other surface. Doing so can place bacteria on the bag, which can cause an infection. °5. Clean the pour spout with a gauze pad or cotton ball that has rubbing alcohol on it. °6. Close the pour spout. °7. Attach the bag to your leg with adhesive tape or a leg strap. °8. Wash your hands well. °Changing the Drainage Bag °Change your drainage bag once a month or sooner if it starts to smell bad or look dirty. Below are steps to follow when changing the drainage bag. °1. Wash your hands with soap and water. °2. Pinch off the rubber catheter so that urine does not spill out. °3. Disconnect the catheter tube from the drainage tube at the connection valve. Do not let the tubes touch any surface. °4. Clean the end of the catheter tube with an alcohol wipe. Use a different alcohol wipe to clean the   end of the drainage tube. °5. Connect the catheter tube to the drainage tube of the clean drainage bag. °6. Attach the new bag to the leg with adhesive tape or a leg strap. Avoid attaching the new bag too tightly. °7. Wash your hands well. °Cleaning the Drainage Bag °1. Wash your hands with soap and water. °2. Wash the bag in warm, soapy water. °3. Rinse the bag thoroughly with warm water. °4. Fill the bag with a solution of white vinegar and water (1 cup vinegar to 1 qt warm water [.2 L vinegar to 1 L warm water]). Close the bag and soak it for 30 minutes in the solution. °5. Rinse the bag with warm water. °6. Hang the bag to dry with the pour spout open  and hanging downward. °7. Store the clean bag (once it is dry) in a clean plastic bag. °8. Wash your hands well. °PREVENTING INFECTION °· Wash your hands before and after handling your catheter. °· Take showers daily and wash the area where the catheter enters your body. Do not take baths. Replace wet leg straps with dry ones, if this applies. °· Do not use powders, sprays, or lotions on the genital area. Only use creams, lotions, or ointments as directed by your caregiver. °· For females, wipe from front to back after each bowel movement. °· Drink enough fluids to keep your urine clear or pale yellow unless you have a fluid restriction. °· Do not let the drainage bag or tubing touch or lie on the floor. °· Wear cotton underwear to absorb moisture and to keep your skin drier. °SEEK MEDICAL CARE IF:  °· Your urine is cloudy or smells unusually bad. °· Your catheter becomes clogged. °· You are not draining urine into the bag or your bladder feels full. °· Your catheter starts to leak. °SEEK IMMEDIATE MEDICAL CARE IF:  °· You have pain, swelling, redness, or pus where the catheter enters the body. °· You have pain in the abdomen, legs, lower back, or bladder. °· You have a fever. °· You see blood fill the catheter, or your urine is pink or red. °· You have nausea, vomiting, or chills. °· Your catheter gets pulled out. °MAKE SURE YOU:  °· Understand these instructions. °· Will watch your condition. °· Will get help right away if you are not doing well or get worse. °Document Released: 02/12/2005 Document Revised: 06/29/2013 Document Reviewed: 02/04/2012 °Exit[employer]® Patient Information ©2015 [employer]. This information is not intended to replace advice given to you by your health care provider. Make sure you discuss any questions you have with your health care provider. ° °

## 2014-10-25 NOTE — ED Notes (Addendum)
Foley inserted by Renea Ee. Sterile technique observed by Erlene Quan EMT. Peri-care provided by Erlene Quan EMT. Bag labeled with time, date, location and initials.

## 2014-10-26 ENCOUNTER — Encounter: Payer: Self-pay | Admitting: Family Medicine

## 2014-10-26 ENCOUNTER — Ambulatory Visit (INDEPENDENT_AMBULATORY_CARE_PROVIDER_SITE_OTHER): Payer: Medicare Other | Admitting: Family Medicine

## 2014-10-26 VITALS — BP 144/81 | HR 82 | Temp 98.3°F | Ht 69.0 in | Wt 190.0 lb

## 2014-10-26 DIAGNOSIS — G8929 Other chronic pain: Secondary | ICD-10-CM

## 2014-10-26 DIAGNOSIS — M25561 Pain in right knee: Secondary | ICD-10-CM | POA: Diagnosis not present

## 2014-10-26 DIAGNOSIS — F329 Major depressive disorder, single episode, unspecified: Secondary | ICD-10-CM

## 2014-10-26 DIAGNOSIS — M5441 Lumbago with sciatica, right side: Secondary | ICD-10-CM

## 2014-10-26 DIAGNOSIS — F32A Depression, unspecified: Secondary | ICD-10-CM

## 2014-10-26 DIAGNOSIS — I10 Essential (primary) hypertension: Secondary | ICD-10-CM | POA: Diagnosis not present

## 2014-10-26 DIAGNOSIS — M25562 Pain in left knee: Secondary | ICD-10-CM

## 2014-10-26 LAB — URINE CULTURE: Culture: NO GROWTH

## 2014-10-26 MED ORDER — HYDROCODONE-ACETAMINOPHEN 7.5-325 MG PO TABS: 1.0000 | ORAL_TABLET | Freq: Three times a day (TID) | ORAL | Status: DC | PRN

## 2014-10-26 MED ORDER — VENLAFAXINE HCL ER 37.5 MG PO CP24: 37.5000 mg | ORAL_CAPSULE | Freq: Every day | ORAL | Status: DC

## 2014-10-26 NOTE — Progress Notes (Signed)
    Subjective    Tyler Lester is a 69 y.o. male that presents for a follow-up visit for chronic issues.   1. Chronic pain: Adherent with Norco. He is using it 2-3 times per day. No falls. Gabapentin helps as well and he is taking as prescribed. He has issues with incontinence and is going to see a Dealer on 9/2   2. Hypertension: Adherent with lisinopril and hydrochlorothiazide. He is taking 20mg  BID and 25mg  daily respectively. No chest pain/dyspnea.  3. Depression: Mood is good. He is adherent with Effexor 37.5mg . No side effects. No SI.  Social History  Substance Use Topics  . Smoking status: Never Smoker   . Smokeless tobacco: Never Used  . Alcohol Use: Yes     Comment: rare alcohol /beer    Allergies  Allergen Reactions  . Poison Ivy Treatments Hives    No orders of the defined types were placed in this encounter.    ROS  Per HPI   Objective   BP 144/81 mmHg  Pulse 82  Temp(Src) 98.3 F (36.8 C) (Oral)  Ht 5\' 9"  (1.753 m)  Wt 190 lb (86.183 kg)  BMI 28.05 kg/m2  General: Fair appearing, no distress  Assessment and Plan    Chronic pain Controlled. Refill Norco  Depression Patient again did not bring medications. Reiterated this to him. Mood is stable. He is followed by neurology as well for Parkinsonism. Will continue Effexor.  HTN (hypertension) Blood pressure mildly elevated today. May need to adjust regimen. Will continue for now and recheck at next visit.

## 2014-10-26 NOTE — Patient Instructions (Signed)
Thank you for coming to see me today. It was a pleasure. Today we talked about:   Pain: I am refilling your medication  Hypertension: please continue as prescribed. No changes today  Depression: I'm glad the Effexor is helping you. No changes  Please bring all your pill bottles to your next visit  Please make an appointment to see me in 3 months for followup.  If you have any questions or concerns, please do not hesitate to call the office at [phone].  Sincerely,  Cordelia Poche, MD

## 2014-10-28 ENCOUNTER — Other Ambulatory Visit: Payer: Self-pay | Admitting: *Deleted

## 2014-10-28 ENCOUNTER — Emergency Department (HOSPITAL_COMMUNITY)
Admission: EM | Admit: 2014-10-28 | Discharge: 2014-10-29 | Disposition: A | Discharge status: Home / Self Care | Payer: Medicare Other | Attending: Emergency Medicine | Admitting: Emergency Medicine

## 2014-10-28 ENCOUNTER — Encounter (HOSPITAL_COMMUNITY): Payer: Self-pay | Admitting: Emergency Medicine

## 2014-10-28 DIAGNOSIS — Z8701 Personal history of pneumonia (recurrent): Secondary | ICD-10-CM | POA: Diagnosis not present

## 2014-10-28 DIAGNOSIS — G8929 Other chronic pain: Secondary | ICD-10-CM | POA: Insufficient documentation

## 2014-10-28 DIAGNOSIS — E119 Type 2 diabetes mellitus without complications: Secondary | ICD-10-CM | POA: Insufficient documentation

## 2014-10-28 DIAGNOSIS — T83028A Displacement of other indwelling urethral catheter, initial encounter: Secondary | ICD-10-CM | POA: Insufficient documentation

## 2014-10-28 DIAGNOSIS — R319 Hematuria, unspecified: Secondary | ICD-10-CM | POA: Diagnosis present

## 2014-10-28 DIAGNOSIS — Z8639 Personal history of other endocrine, nutritional and metabolic disease: Secondary | ICD-10-CM | POA: Insufficient documentation

## 2014-10-28 DIAGNOSIS — Z79899 Other long term (current) drug therapy: Secondary | ICD-10-CM | POA: Insufficient documentation

## 2014-10-28 DIAGNOSIS — Z8669 Personal history of other diseases of the nervous system and sense organs: Secondary | ICD-10-CM | POA: Diagnosis not present

## 2014-10-28 DIAGNOSIS — I10 Essential (primary) hypertension: Secondary | ICD-10-CM | POA: Insufficient documentation

## 2014-10-28 DIAGNOSIS — Y846 Urinary catheterization as the cause of abnormal reaction of the patient, or of later complication, without mention of misadventure at the time of the procedure: Secondary | ICD-10-CM | POA: Diagnosis not present

## 2014-10-28 DIAGNOSIS — T83091A Other mechanical complication of indwelling urethral catheter, initial encounter: Secondary | ICD-10-CM

## 2014-10-28 NOTE — ED Notes (Signed)
Bed: WA03 Expected date:  Expected time:  Means of arrival:  Comments: Catheter issues

## 2014-10-28 NOTE — ED Provider Notes (Signed)
CSN: [id number]     Arrival date & time 10/28/14  2315 History  This chart was scribed for Tyler Gambler, MD by Evelene Croon, ED Scribe. This patient was seen in room WA03/WA03 and the patient's care was started 12:00 AM.     Chief Complaint  Patient presents with  . Hematuria   The history is provided by the patient. No language interpreter was used.    HPI Comments:  Tyler Lester is a 69 y.o. male who presents to the Emergency Department complaining of difficulty urinating from his foley catheter today. Pt was seen in the ED  On 10/26/2014 for urinary retention and had this current catheter placed.  He reports associated  suprapubic pain and hematuria reports 2 bags worth of bloody urine today.  He denies vomiting and back pain. No alleviating factors noted. Pt has a urology appointment scheduled for 10/29/14 at 0930. No fevers. Is not on blood thinners.  Past Medical History  Diagnosis Date  . Diabetes mellitus   . Chronic pain   . Hypertension   . Hyperlipidemia   . Sleep apnea   . Shortness of breath   . Inguinal hernia     right  . Pneumonia   . Numbness     T/O  . Bilateral foot pain    Past Surgical History  Procedure Laterality Date  . Colon surgery    . Cataract extraction w/ intraocular lens implant Left 06/2012    laser for posterior capsule?  Marland Kitchen Tonsillectomy    . Posterior cervical fusion/foraminotomy N/A 11/12/2013    Procedure: Posterior cervical decompression fusion, cervical 3-4, cervical 4-5, cervical 5-6, cervical 6-7 with instrumentation and allograft   (LEVEL 4);  Surgeon: Sinclair Ship, MD;  Location: [facility];  Service: Orthopedics;  Laterality: N/A;  Posterior cervical decompression fusion, cervical 3-4, cervical 4-5, cervical 5-6, cervical 6-7 with instrumentation and allograft   Family History  Problem Relation Age of Onset  . Cancer - Other Other   . Diabetes Other   . Hypertension Other    Social History  Substance Use Topics  . Smoking  status: Never Smoker   . Smokeless tobacco: Never Used  . Alcohol Use: Yes     Comment: rare alcohol /beer    Review of Systems  Constitutional: Negative for fever.  Gastrointestinal: Positive for abdominal pain. Negative for vomiting.  Genitourinary: Positive for hematuria and difficulty urinating.  Musculoskeletal: Negative for back pain.  All other systems reviewed and are negative.  Allergies  Poison ivy treatments  Home Medications   Prior to Admission medications   Medication Sig Start Date End Date Taking? Authorizing Provider  atorvastatin (LIPITOR) 20 MG tablet Take 1 tablet (20 mg total) by mouth daily. 04/08/13  Yes Kandis Nab, MD  finasteride (PROSCAR) 5 MG tablet Take 5 mg by mouth daily. 09/15/14  Yes Historical Provider, MD  gabapentin (NEURONTIN) 300 MG capsule Take 1 capsule (300 mg total) by mouth 3 (three) times daily. May increase to 2 capsules (600mg  total) 3 times daily as needed. Patient taking differently: Take 300 mg by mouth 2 (two) times daily. May increase to 2 capsules (600mg  total) 3 times daily as needed. 03/22/14  Yes Melvenia Beam, MD  hydrochlorothiazide (HYDRODIURIL) 25 MG tablet Take 25 mg by mouth daily.   Yes Historical Provider, MD  HYDROcodone-acetaminophen (NORCO) 7.5-325 MG per tablet Take 1 tablet by mouth every 8 (eight) hours as needed for moderate pain. 10/26/14  Yes Deidre Ala  A Nettey, MD  lisinopril (PRINIVIL,ZESTRIL) 40 MG tablet Take 1 tablet (40 mg total) by mouth daily. Patient taking differently: Take 20 mg by mouth daily.  08/31/14  Yes Mariel Aloe, MD  potassium chloride SA (K-DUR,KLOR-CON) 20 MEQ tablet Take 1 tablet (20 mEq total) by mouth daily. 10/25/14  Yes Jennifer Piepenbrink, PA-C  rasagiline (AZILECT) 1 MG TABS tablet Take 1 tablet (1 mg total) by mouth daily. 03/22/14  Yes Melvenia Beam, MD  rotigotine (NEUPRO) 4 MG/24HR Place 1 patch onto the skin daily. Use one patch daily as instructed 10/19/14  Yes Melvenia Beam, MD   tamsulosin (FLOMAX) 0.4 MG CAPS capsule Take 1 capsule (0.4 mg total) by mouth daily after supper. 03/25/14  Yes Charlesetta Shanks, MD  venlafaxine XR (EFFEXOR-XR) 37.5 MG 24 hr capsule Take 1 capsule (37.5 mg total) by mouth daily. 10/26/14  Yes Mariel Aloe, MD  Cholecalciferol (VITAMIN D3) 400 UNITS tablet Take 2 tablets (800 Units total) by mouth daily. Patient not taking: Reported on 10/28/2014 03/10/12   Vivi Ferns, MD  docusate sodium (COLACE) 100 MG capsule Take 1 capsule (100 mg total) by mouth 2 (two) times daily. Patient not taking: Reported on 10/19/2014 10/08/14   Mercy Riding, MD  ipratropium (ATROVENT) 0.06 % nasal spray Place 2 sprays into both nostrils 4 (four) times daily. Patient not taking: Reported on 10/19/2014 04/30/13   Kandis Nab, MD  senna (SENOKOT) 8.6 MG TABS tablet Take 1 tablet (8.6 mg total) by mouth 2 (two) times daily. Patient not taking: Reported on 10/19/2014 10/08/14   Mercy Riding, MD  silver sulfADIAZINE (SILVADENE) 1 % cream Apply 1 application topically daily. Patient not taking: Reported on 10/19/2014 06/09/14   Trula Slade, DPM   BP 189/86 mmHg  Pulse 95  Temp(Src) 98.8 F (37.1 C) (Oral)  Resp 18  SpO2 96% Physical Exam  Constitutional: He is oriented to person, place, and time. He appears well-developed and well-nourished.  HENT:  Head: Normocephalic and atraumatic.  Right Ear: External ear normal.  Left Ear: External ear normal.  Nose: Nose normal.  Eyes: Right eye exhibits no discharge. Left eye exhibits no discharge.  Neck: Neck supple.  Cardiovascular: Normal rate, regular rhythm, normal heart sounds and intact distal pulses.   Pulmonary/Chest: Effort normal and breath sounds normal.  Abdominal: Soft. There is tenderness.  Mild suprapubic tenderness   Genitourinary:  Nml penis without tenderness or swelling Foley cath in place  No scrotal swelling Small amount of red urine in foley bag   Musculoskeletal: He exhibits no edema.   Neurological: He is alert and oriented to person, place, and time.  Skin: Skin is warm and dry.  Nursing note and vitals reviewed.   ED Course  Procedures   DIAGNOSTIC STUDIES:  Oxygen Saturation is 96% on RA, normal by my interpretation.    COORDINATION OF CARE:  12:04 AM Discussed treatment plan with pt at bedside and pt agreed to plan.  Labs Review Labs Reviewed  URINALYSIS, ROUTINE W REFLEX MICROSCOPIC (NOT AT [facility]) - Abnormal; Notable for the following:    Color, Urine RED (*)    APPearance CLOUDY (*)    Hgb urine dipstick LARGE (*)    Protein, ur 100 (*)    Leukocytes, UA TRACE (*)    All other components within normal limits  URINE CULTURE  URINE MICROSCOPIC-ADD ON    Imaging Review No results found. I have personally reviewed and evaluated these  images and lab results as part of my medical decision-making.   EKG Interpretation None      MDM   Final diagnoses:  Obstructed Foley catheter, initial encounter    Patient's catheter does not flow despite being irrigated. New one placed and now large urine output. Initially bloody, starting to clear. Numerous RBCs but no signs of UTI. Likely a blood clot caused his obstruction. No abdominal pain after bladder drained. New foley will be left in and instructed to keep urology appointment this morning.   I personally performed the services described in this documentation, which was scribed in my presence. The recorded information has been reviewed and is accurate.    Tyler Gambler, MD 10/29/14 [phone]

## 2014-10-28 NOTE — ED Notes (Signed)
Pt bib EMS.  Pt has a chronic foley that was changed 2 days ago.  Pt reports increased pain and blood clots.  Pt reports urine is still flowing but is reduced.  Pt ambulatory. Pt has a urology clinic appt in the AM. Pt a/o x 4.

## 2014-10-29 DIAGNOSIS — T83028A Displacement of other indwelling urethral catheter, initial encounter: Secondary | ICD-10-CM | POA: Diagnosis not present

## 2014-10-29 LAB — URINALYSIS, ROUTINE W REFLEX MICROSCOPIC
Bilirubin Urine: NEGATIVE
Glucose, UA: NEGATIVE mg/dL
Ketones, ur: NEGATIVE mg/dL
Nitrite: NEGATIVE
Protein, ur: 100 mg/dL — AB
Specific Gravity, Urine: 1.013 (ref 1.005–1.030)
Urobilinogen, UA: 1 mg/dL (ref 0.0–1.0)
pH: 6.5 (ref 5.0–8.0)

## 2014-10-29 LAB — URINE MICROSCOPIC-ADD ON

## 2014-10-29 MED ORDER — LISINOPRIL 40 MG PO TABS: 40.0000 mg | ORAL_TABLET | Freq: Every day | ORAL | Status: DC

## 2014-10-29 NOTE — ED Notes (Addendum)
Pt's foley was irrigated with 272ml of NaCl.  No urine was returned.  Dr. Regenia Skeeter was informed.  Dr. Regenia Skeeter gave verbal order to replace foley. Upon removal of foley, some red blood came out of pt's penis. After foley was replaced, pink color urine flowed back and pt felt pain decreased. MD made aware.

## 2014-10-30 LAB — URINE CULTURE: Culture: NO GROWTH

## 2014-10-30 NOTE — Assessment & Plan Note (Signed)
Patient again did not bring medications. Reiterated this to him. Mood is stable. He is followed by neurology as well for Parkinsonism. Will continue Effexor.

## 2014-10-30 NOTE — Assessment & Plan Note (Signed)
Controlled. Refill Norco

## 2014-10-30 NOTE — Assessment & Plan Note (Signed)
Blood pressure mildly elevated today. May need to adjust regimen. Will continue for now and recheck at next visit.

## 2014-11-11 ENCOUNTER — Ambulatory Visit (INDEPENDENT_AMBULATORY_CARE_PROVIDER_SITE_OTHER): Payer: Medicare Other | Admitting: Family Medicine

## 2014-11-11 ENCOUNTER — Ambulatory Visit (HOSPITAL_COMMUNITY): Payer: Medicare Other

## 2014-11-11 ENCOUNTER — Encounter: Payer: Self-pay | Admitting: Family Medicine

## 2014-11-11 VITALS — BP 124/65 | HR 80 | Temp 98.4°F | Ht 69.0 in | Wt 188.0 lb

## 2014-11-11 DIAGNOSIS — R5382 Chronic fatigue, unspecified: Secondary | ICD-10-CM | POA: Diagnosis not present

## 2014-11-11 DIAGNOSIS — M7989 Other specified soft tissue disorders: Secondary | ICD-10-CM | POA: Diagnosis not present

## 2014-11-11 DIAGNOSIS — E876 Hypokalemia: Secondary | ICD-10-CM

## 2014-11-11 MED ORDER — PREGABALIN 50 MG PO CAPS: 50.0000 mg | ORAL_CAPSULE | Freq: Three times a day (TID) | ORAL | Status: DC

## 2014-11-11 NOTE — Assessment & Plan Note (Signed)
Left arm swelling of unknown duration. Clinical concern for possible LUE DVT -stat Left UE duplex

## 2014-11-11 NOTE — Progress Notes (Addendum)
   Subjective:    Patient ID: Tyler Lester, male    DOB: 1945/03/15, 69 y.o.   MRN: [id number]  HPI 69 y/o with PMH Parkinson's, diastolic heart failure, and diabetes presents for evaluation of fatigue.  Chronic in nature (9 months), started after starting Gabapentin, feels like he is moving "more slowly", does not have the energy that he used to, wants to sleep all the time, gets 8 hours of sleep during the night, will nap multiple times during the day,   Parkinson's is managed by PCP and Neurology (on Rotigotine and Rasagiline)  Depression - on Venlafaxine, reports improvement of depression symptoms  Left hand swelling - patient was not aware that arm was swollen (noted on medical student exam), patient has had previous cervical spine surgery and has chronic weakness of left UE  ROS - no headaches/dizziness, does report some balance issues, denies orthopnea, no snoring, no exertional dyspnea, no chest pain, no dysuria, no abdominal pain, no weakness/loss of sensation, no fevers/chills/nightsweats   Review of Systems See HPI    Objective:   Physical Exam Vitals: reviewed Gen: pleasant male, NAD Cardiac: RRR, S1 and S2 present, no murmur Resp: CTAB, normal effort Ext: left arm/hand - 2+ swelling up to shoulder; no LE edema, no right UE edema Neuro: pill rolling tremor present MSK: knot/spasm of left trapezius muscle Vascular: 2+ radial pulses bilaterally  06/2014 - TSH wnl 10/25/14 - BMP wnl except low potassium       Assessment & Plan:  Please see problem specific assessment and plan.

## 2014-11-11 NOTE — Patient Instructions (Signed)
It was nice to see you today.  Left arm swelling - we will send you to the hospital for an ultrasound to rule out a blood clot  Fatigue - I think that your fatigue may be due to your Gabapentin, please stop Gabapentin and start Lyrica (50 mg three times per day). I think that some of your fatigue may be due to the Parkinson's.   Please return if this does not help your fatigue.

## 2014-11-11 NOTE — Assessment & Plan Note (Signed)
Patient reports 9 months of fatigue (this has previously been an issue for this patient). Likely multifactorial from Medications (gabapentin) and Parkinson's disease. Symptoms of "moving slower" especially concerning for worsening Parkinson's. Review of lab work did not identify metabolic cause.  -attempt trial off Gabapentin, as patient has peripheral neuropathy will transition to Lyrica -patient seen by Neurology one month ago, encouraged to return to neurology if symptoms not improved with medication change

## 2014-11-11 NOTE — Assessment & Plan Note (Signed)
Noted to be 2.9 on 10/25/14 (patient seen in ED at that time) -BMP not rechecked today as patient had possibly acute left upper extremity swelling concerning for DVT (sent for LUE venous doppler) -will place future order and have patient scheduled future lab appointment

## 2014-11-12 ENCOUNTER — Ambulatory Visit (HOSPITAL_COMMUNITY)
Admission: RE | Admit: 2014-11-12 | Discharge: 2014-11-12 | Disposition: A | Discharge status: Home / Self Care | Payer: Medicare Other | Source: Ambulatory Visit | Attending: Family Medicine | Admitting: Family Medicine

## 2014-11-12 DIAGNOSIS — M7989 Other specified soft tissue disorders: Secondary | ICD-10-CM

## 2014-11-12 DIAGNOSIS — M79602 Pain in left arm: Secondary | ICD-10-CM | POA: Diagnosis present

## 2014-11-12 NOTE — Progress Notes (Signed)
*  Preliminary Results* Left upper extremity venous duplex completed. Left upper extremity is negative for deep and superficial vein thrombosis.  11/12/2014 2:03 PM  Maudry Mayhew, RVT, RDCS, RDMS

## 2014-11-15 ENCOUNTER — Encounter: Payer: Self-pay | Admitting: Family Medicine

## 2014-11-16 ENCOUNTER — Ambulatory Visit: Payer: Medicare Other | Admitting: Family Medicine

## 2014-11-17 ENCOUNTER — Ambulatory Visit (INDEPENDENT_AMBULATORY_CARE_PROVIDER_SITE_OTHER): Payer: Medicare Other | Admitting: Neurology

## 2014-11-17 VITALS — BP 170/92 | HR 77 | Ht 69.0 in | Wt 191.6 lb

## 2014-11-17 DIAGNOSIS — G20 Parkinson's disease: Secondary | ICD-10-CM | POA: Diagnosis not present

## 2014-11-17 DIAGNOSIS — R413 Other amnesia: Secondary | ICD-10-CM | POA: Diagnosis not present

## 2014-11-17 DIAGNOSIS — R251 Tremor, unspecified: Secondary | ICD-10-CM | POA: Diagnosis not present

## 2014-11-17 MED ORDER — CARBIDOPA-LEVODOPA 25-100 MG PO TABS: 0.5000 | ORAL_TABLET | Freq: Three times a day (TID) | ORAL | Status: DC

## 2014-11-17 NOTE — Progress Notes (Signed)
[facility]   Provider: Dr Jaynee Eagles Referring Provider: Cordelia Poche, MD Primary Care Physician: Cordelia Poche, MD  CC: Tremor  HPI: CANDACE RAMUS is a 69 y.o. male here as a follow up for tremor. Memory problems started years ago. The tremor is more at rest. And the jaw trembles as well. He says he is still taking the medication I gave him and he says it is not helping. Feels like his voice is getting softer. He has vivid dreams. He has drool on his pillow. He has fallen a few times and he catches himslef. He has loss of smell and taste. He can't smell dinner. He is swallowing OK. His handwriting has gotten smaller, when he writes memos he has noticed he has gotten smaller.  The neupro made him dizzy. He wants to have more sex while taking the neupro. Will discontinue.   Interval history 11/17/2014: He is having neck pain. He was scheduled today to discuss memory loss and he was supposed to bring a family member but he forgot.  He would like pain medication and gets Vicadin but Dr. Lonny Prude won't give hm anymore because he had alcohol in his urine. He is not wearing his neupro patch today.  I am not sure he is taking his medications. He declines any services to help him in the house. He has not started the Lyrica. He never got the MRI of the brain completed that I ordered last December. He says the neupro patch doesn't work and gives him a lot of urges to have sex.  Interval History 03/22/2014: the tremor continues. He appears to be a poor historian and doesn't quite know what medications he is taking. He is not having any side effects from the Azilect and says he believes he is taking it. He also has neck pain and low back pain. He is on pain medication and is waiting to be seen by pain management. He had recent cervical spine surgery and has had lumber spine surgery in the past. He is having shooting pain from low back into right leg that is chronic and he is being managed by  orthopaedic doctor. The orthopedist cancelled his pain medication because he was already getting vicodin from dr. Teryl Lucy and patient wants to know if I can give him the percocet instead of vicodin. He is only taking 200mg  three times daily (at first he said just once daily) of neurontin. No side effects from the gabapentin.   02/03/2014: KENDRY PFARR is a 69 y.o. male here as a referral from Dr. Lonny Prude for Tremor. PMHx cervical fusion (September), chronic back pain, DM, HTN, fatigue, depression, anxiety, myelopathy.   Was sent here by Dr. Teryl Lucy for evaluation of tremor. He has a tremor in his left hand and his jaw. Tremor has only been going on for at least a year but worse in the last 3 months. Getting worse after neck surgery. Worse in the left but also in the right hand as well as the chin. He can't walk, he has chronic pain in his low back and neck. Walking slow, doesn't have energy. No tremors in other family members. Doesn't notice the tremor getting better with alcohol. No constipation. Can hardly smell. Sometimes he is talking low, softer. Hand writing is shaky, smaller. Feels weak. TSH was normal in June. Does not drink caffeine. 1-2 drinks a week, no significant alcohol. No FHx of parkinson's or neurodegenerative disease. Has had falls.   Review of Systems: Patient  complains of symptoms per HPI as well as the following symptoms: No CP, no SOB. Pertinent negatives per HPI. All others negative.   Social History   Social History  . Marital Status: Single    Spouse Name: N/A  . Number of Children: 2  . Years of Education: 12   Occupational History  . Retired    . Disability    Social History Main Topics  . Smoking status: Never Smoker   . Smokeless tobacco: Never Used  . Alcohol Use: Yes     Comment: rare alcohol /beer  . Drug Use: No  . Sexual Activity: Not on file   Other Topics Concern  . Not on file   Social History Narrative   Patient lives at home alone.    Patient  have 2 children.    Patient has a high school education    Patient is right handed.    Patient is retired on disability.        Family History  Problem Relation Age of Onset  . Cancer - Other Other   . Diabetes Other   . Hypertension Other     Past Medical History  Diagnosis Date  . Diabetes mellitus   . Chronic pain   . Hypertension   . Hyperlipidemia   . Sleep apnea   . Shortness of breath   . Inguinal hernia     right  . Pneumonia   . Numbness     T/O  . Bilateral foot pain     Past Surgical History  Procedure Laterality Date  . Colon surgery    . Cataract extraction w/ intraocular lens implant Left 06/2012    laser for posterior capsule?  Marland Kitchen Tonsillectomy    . Posterior cervical fusion/foraminotomy N/A 11/12/2013    Procedure: Posterior cervical decompression fusion, cervical 3-4, cervical 4-5, cervical 5-6, cervical 6-7 with instrumentation and allograft   (LEVEL 4);  Surgeon: Sinclair Ship, MD;  Location: [facility];  Service: Orthopedics;  Laterality: N/A;  Posterior cervical decompression fusion, cervical 3-4, cervical 4-5, cervical 5-6, cervical 6-7 with instrumentation and allograft    Current Outpatient Prescriptions  Medication Sig Dispense Refill  . atorvastatin (LIPITOR) 20 MG tablet Take 1 tablet (20 mg total) by mouth daily. 90 tablet 3  . finasteride (PROSCAR) 5 MG tablet Take 5 mg by mouth daily.  0  . hydrochlorothiazide (HYDRODIURIL) 25 MG tablet Take 25 mg by mouth daily.    Marland Kitchen HYDROcodone-acetaminophen (NORCO) 7.5-325 MG per tablet Take 1 tablet by mouth every 8 (eight) hours as needed for moderate pain. 90 tablet 0  . lisinopril (PRINIVIL,ZESTRIL) 40 MG tablet Take 1 tablet (40 mg total) by mouth daily. 30 tablet 2  . potassium chloride SA (K-DUR,KLOR-CON) 20 MEQ tablet Take 1 tablet (20 mEq total) by mouth daily. 4 tablet 0  . pregabalin (LYRICA) 50 MG capsule Take 1 capsule (50 mg total) by mouth 3 (three) times daily. 90 capsule 1  .  rasagiline (AZILECT) 1 MG TABS tablet Take 1 tablet (1 mg total) by mouth daily. 90 tablet 6  . rotigotine (NEUPRO) 4 MG/24HR Place 1 patch onto the skin daily. Use one patch daily as instructed 30 patch 3  . silver sulfADIAZINE (SILVADENE) 1 % cream Apply 1 application topically daily. 50 g 0  . tamsulosin (FLOMAX) 0.4 MG CAPS capsule Take 1 capsule (0.4 mg total) by mouth daily after supper. 30 capsule 0  . venlafaxine XR (EFFEXOR-XR) 37.5 MG 24 hr  capsule Take 1 capsule (37.5 mg total) by mouth daily. 30 capsule 2  . Cholecalciferol (VITAMIN D3) 400 UNITS tablet Take 2 tablets (800 Units total) by mouth daily. (Patient not taking: Reported on 10/28/2014) 60 tablet 1  . docusate sodium (COLACE) 100 MG capsule Take 1 capsule (100 mg total) by mouth 2 (two) times daily. (Patient not taking: Reported on 10/19/2014) 10 capsule 0  . ipratropium (ATROVENT) 0.06 % nasal spray Place 2 sprays into both nostrils 4 (four) times daily. (Patient not taking: Reported on 10/19/2014) 15 mL 12  . senna (SENOKOT) 8.6 MG TABS tablet Take 1 tablet (8.6 mg total) by mouth 2 (two) times daily. (Patient not taking: Reported on 10/19/2014) 120 each 0   No current facility-administered medications for this visit.    Allergies as of 11/17/2014 - Review Complete 11/17/2014  Allergen Reaction Noted  . Poison ivy treatments Hives 05/14/2014    Vitals: BP 170/92 mmHg  Pulse 77  Ht 5\' 9"  (1.753 m)  Wt 191 lb 9.6 oz (86.909 kg)  BMI 28.28 kg/m2 Last Weight:  Wt Readings from Last 1 Encounters:  11/17/14 191 lb 9.6 oz (86.909 kg)   Last Height:   Ht Readings from Last 1 Encounters:  11/17/14 5\' 9"  (1.753 m)    Speech: Mildly hypophonic. No aphasia, not dysarthric Cognition:  The patient is oriented to person, place, and time;  Cranial Nerves:  The pupils are equal, round, and reactive to light. Visual fields are full to finger confrontation. Extraocular movements are intact. Trigeminal sensation is intact  and the muscles of mastication are normal. The face is symmetric. The palate elevates in the midline. Voice is normal. Shoulder shrug is normal. The tongue has normal motion without fasciculations.   Coordination: Bradykinesias left > right finger tapping, slow but intact FTN, no dysmetria  Gait: antalgic. Difficult to assess for shuffling and arm swing because he has chronic pain, recent neck surgery, chronic low back pain and uses a cane.   Motor Observation: Left>R resting tremor and chin tremor. Decreased blink. Postural tremor.    Tone: Mild cogwheeling with facilitation.  Assessment/Plan: 69 year old male here for evaluation of tremor. PMHx cervical fusion (September), chronic back pain, DM, HTN, fatigue, depression, anxiety, myelopathy. Has some parkinsonian features, left > right resting tremor, bradykinesia left > right on finger tapping, mild hypophonia, masked facies. Mild cogwheeling with facilitation. Antalgic gait with small strides however difficult to assess for shuffling or arm swing because he has chronic pain, recent neck surgery, chronic low back pain, neuropathy and uses a cane.   He is a poor historian and I am not entirely convinced he knows which medications he is taking or if he is taking his medications at all . I have asked him to bring his medications with him multiple times but he forget them today again.  MRI of the brain - ordered but he did not have it done. Ordered it again.   Will continue Azilect  Will stop Neupro, patient doesn't think it helps, tells me he wants more sex while on the patch. I will stop it and try Sinemet however again not sure he is taking his meds. I encouraged him to let me contact an agency, [facility], to see if we can get  help at home with medications but he declines multiple times.  Sarina Ill, MD  [facility] [address] [place], [place] [address]  Phone [phone] Fax  [phone]  A total of  25 minutes was spent face-to-face with this patient. Over half this time was spent on counseling patient on the parkinsonism diagnosis and different diagnostic and therapeutic options available.

## 2014-11-17 NOTE — Patient Instructions (Addendum)
Overall you are doing fairly well but I do want to suggest a few things today:   Remember to drink plenty of fluid, eat healthy meals and do not skip any meals. Try to eat protein with a every meal and eat a healthy snack such as fruit or nuts in between meals. Try to keep a regular sleep-wake schedule and try to exercise daily, particularly in the form of walking, 20-30 minutes a day, if you can.   As far as your medications are concerned, I would like to suggest: Stop the patch Continue the Azilect Continue Lyrica Sinemet 1/2 tablet three times a day. Separate from protein by 30 minutes.   As far as diagnostic testing: MRI of the brain  I would like to see you back after MRI of the brain , sooner if we need to. Please call us with any interim questions, concerns, problems, updates or refill requests.   Our phone number is [phone]. We also have an after hours call service for urgent matters and there is a physician on-call for urgent questions. For any emergencies you know to call 911 or go to the nearest emergency room

## 2014-11-18 ENCOUNTER — Encounter: Payer: Self-pay | Admitting: Neurology

## 2014-11-19 ENCOUNTER — Telehealth: Payer: Self-pay | Admitting: Family Medicine

## 2014-11-19 NOTE — Telephone Encounter (Signed)
Spoke to Dr. Lonny Prude directly.  He will write the Rx. Ottis Stain, CMA

## 2014-11-19 NOTE — Telephone Encounter (Signed)
[facility] called and would like the doctor to write a prescription for the patients walker and then have a prior authorization done so that the patient can get his walker. Please give the prescription to [place]. jw

## 2014-11-23 ENCOUNTER — Telehealth: Payer: Self-pay | Admitting: Family Medicine

## 2014-11-23 NOTE — Telephone Encounter (Signed)
Pt called and needs a print out of all the doctors he has seen her for the last year to include dates. Please leave this up front for pickup. He will be in after lunch today. jw

## 2014-11-24 NOTE — Telephone Encounter (Signed)
Called [facility] prior authorization dept (cust. Service number on insurance card)--Per Julie--patient does not need prior authorization for rolling walker with CPT code [phone]. Will place walker prescription at front desk.  Called and left message for patient to call our office back.  Burna Forts, BSN, RN-BC

## 2014-11-24 NOTE — Telephone Encounter (Signed)
Pt called and I informed him of message below. Sadie Reynolds, ASA

## 2014-11-25 ENCOUNTER — Telehealth: Payer: Self-pay | Admitting: Family Medicine

## 2014-11-25 NOTE — Telephone Encounter (Signed)
[facility] called and would like [place] to fax them a medication list and a diagnosis list to them at [phone] attention Fredrich Birks. jw

## 2014-11-26 ENCOUNTER — Other Ambulatory Visit: Payer: Self-pay | Admitting: *Deleted

## 2014-11-26 MED ORDER — LISINOPRIL 40 MG PO TABS: 40.0000 mg | ORAL_TABLET | Freq: Every day | ORAL | Status: DC

## 2014-11-28 ENCOUNTER — Other Ambulatory Visit: Payer: Medicare Other

## 2014-11-30 ENCOUNTER — Inpatient Hospital Stay: Admission: RE | Admit: 2014-11-30 | Payer: Medicare Other | Source: Ambulatory Visit

## 2014-11-30 NOTE — Telephone Encounter (Signed)
Will forward to [employer] in medical records. Jazmin Hartsell,CMA

## 2014-12-02 NOTE — Telephone Encounter (Signed)
I believe patient will have to give consent first.

## 2014-12-08 NOTE — Telephone Encounter (Signed)
Need to find out about a 214 form from [place] that was given to front desk last Friday.   Please contact patient to confirm receipt and when document will be completed.

## 2014-12-09 NOTE — Telephone Encounter (Signed)
[place] Dept of [place] trying to reach [facility] and clarify exactly why the information is needed to determine if we need to get a ROI from the pt.  I called and connected to the Clinical Services and spoke with a lady in records and she stated that she was not familiar with Fredrich Birks and that she tried to do and email to see if she popped up and did not.  Told her that I would just wait to see if Fredrich Birks calls back.  If she does we need to get contact information from her to determine what we need to do to get her the information. Katharina Caper, Shahil Speegle D, [name]

## 2014-12-16 ENCOUNTER — Encounter (HOSPITAL_COMMUNITY): Payer: Self-pay | Admitting: Emergency Medicine

## 2014-12-16 ENCOUNTER — Emergency Department (INDEPENDENT_AMBULATORY_CARE_PROVIDER_SITE_OTHER)
Admission: EM | Admit: 2014-12-16 | Discharge: 2014-12-16 | Disposition: A | Discharge status: Home / Self Care | Payer: Medicare Other | Source: Home / Self Care

## 2014-12-16 DIAGNOSIS — R197 Diarrhea, unspecified: Secondary | ICD-10-CM

## 2014-12-16 DIAGNOSIS — I160 Hypertensive urgency: Secondary | ICD-10-CM | POA: Diagnosis not present

## 2014-12-16 NOTE — Discharge Instructions (Signed)
You have a viral gut infection called gastroenteritis This will take 1-5 days to resolve Get plenty of rest and drink lots of fluids (especially things like gatorade) Use a probiotic or yogurt to treat your symptoms Please take your blood pressure medications

## 2014-12-16 NOTE — ED Notes (Signed)
C/o diarrhea and fatigue since yesterday evening Denies any abd pain States he has no energy

## 2014-12-16 NOTE — ED Provider Notes (Signed)
CSN: [id number]     Arrival date & time 12/16/14  1714 History   None    Chief Complaint  Patient presents with  . Diarrhea  . Fatigue   (Consider location/radiation/quality/duration/timing/severity/associated sxs/prior Treatment) HPI   Diarrhea. Started 1 day ago. 4-6 episodes in past 24 hrs. Watery. Denies fevers, dysuria, back pain, abd pain, HA, CP, SOB palp. Very fatigued since onset. Has not tried anything.  Tolerating PO  Forgot to take BP meds this am   Past Medical History  Diagnosis Date  . Diabetes mellitus   . Chronic pain   . Hypertension   . Hyperlipidemia   . Sleep apnea   . Shortness of breath   . Inguinal hernia     right  . Pneumonia   . Numbness     T/O  . Bilateral foot pain    Past Surgical History  Procedure Laterality Date  . Colon surgery    . Cataract extraction w/ intraocular lens implant Left 06/2012    laser for posterior capsule?  Marland Kitchen Tonsillectomy    . Posterior cervical fusion/foraminotomy N/A 11/12/2013    Procedure: Posterior cervical decompression fusion, cervical 3-4, cervical 4-5, cervical 5-6, cervical 6-7 with instrumentation and allograft   (LEVEL 4);  Surgeon: Sinclair Ship, MD;  Location: [facility];  Service: Orthopedics;  Laterality: N/A;  Posterior cervical decompression fusion, cervical 3-4, cervical 4-5, cervical 5-6, cervical 6-7 with instrumentation and allograft   Family History  Problem Relation Age of Onset  . Cancer - Other Other   . Diabetes Other   . Hypertension Other    Social History  Substance Use Topics  . Smoking status: Never Smoker   . Smokeless tobacco: Never Used  . Alcohol Use: Yes     Comment: rare alcohol /beer    Review of Systems Per HPI with all other pertinent systems negative.   Allergies  Poison ivy treatments  Home Medications   Prior to Admission medications   Medication Sig Start Date End Date Taking? Authorizing Provider  atorvastatin (LIPITOR) 20 MG tablet Take 1 tablet (20  mg total) by mouth daily. 04/08/13   Kandis Nab, MD  carbidopa-levodopa (SINEMET) 25-100 MG per tablet Take 0.5 tablets by mouth 3 (three) times daily. 1/2 tablet po at bedtime. Can repeat additional 1/2 tablet later in evening as needed 11/17/14   Melvenia Beam, MD  Cholecalciferol (VITAMIN D3) 400 UNITS tablet Take 2 tablets (800 Units total) by mouth daily. Patient not taking: Reported on 10/28/2014 03/10/12   Vivi Ferns, MD  docusate sodium (COLACE) 100 MG capsule Take 1 capsule (100 mg total) by mouth 2 (two) times daily. Patient not taking: Reported on 10/19/2014 10/08/14   Mercy Riding, MD  finasteride (PROSCAR) 5 MG tablet Take 5 mg by mouth daily. 09/15/14   Historical Provider, MD  hydrochlorothiazide (HYDRODIURIL) 25 MG tablet Take 25 mg by mouth daily.    Historical Provider, MD  HYDROcodone-acetaminophen (NORCO) 7.5-325 MG per tablet Take 1 tablet by mouth every 8 (eight) hours as needed for moderate pain. 10/26/14   Mariel Aloe, MD  ipratropium (ATROVENT) 0.06 % nasal spray Place 2 sprays into both nostrils 4 (four) times daily. Patient not taking: Reported on 10/19/2014 04/30/13   Kandis Nab, MD  lisinopril (PRINIVIL,ZESTRIL) 40 MG tablet Take 1 tablet (40 mg total) by mouth daily. 11/26/14   Mariel Aloe, MD  potassium chloride SA (K-DUR,KLOR-CON) 20 MEQ tablet Take 1 tablet (20  mEq total) by mouth daily. 10/25/14   Jennifer Piepenbrink, PA-C  pregabalin (LYRICA) 50 MG capsule Take 1 capsule (50 mg total) by mouth 3 (three) times daily. 11/11/14   Lupita Dawn, MD  rasagiline (AZILECT) 1 MG TABS tablet Take 1 tablet (1 mg total) by mouth daily. 03/22/14   Melvenia Beam, MD  senna (SENOKOT) 8.6 MG TABS tablet Take 1 tablet (8.6 mg total) by mouth 2 (two) times daily. Patient not taking: Reported on 10/19/2014 10/08/14   Mercy Riding, MD  silver sulfADIAZINE (SILVADENE) 1 % cream Apply 1 application topically daily. 06/09/14   Trula Slade, DPM  tamsulosin (FLOMAX) 0.4 MG  CAPS capsule Take 1 capsule (0.4 mg total) by mouth daily after supper. 03/25/14   Charlesetta Shanks, MD  venlafaxine XR (EFFEXOR-XR) 37.5 MG 24 hr capsule Take 1 capsule (37.5 mg total) by mouth daily. 10/26/14   Mariel Aloe, MD   Meds Ordered and Administered this Visit  Medications - No data to display  BP 198/112 mmHg  Pulse 77  Temp(Src) 98.1 F (36.7 C) (Oral)  Resp 20  SpO2 100% No data found.   Physical Exam Physical Exam  Constitutional: oriented to person, place, and time. appears well-developed and well-nourished. No distress.  HENT:  Head: Normocephalic and atraumatic.  Eyes: EOMI. PERRL.  Neck: Normal range of motion.  Cardiovascular: RRR, no m/r/g, 2+ distal pulses,  Pulmonary/Chest: Effort normal and breath sounds normal. No respiratory distress.  Abdominal: Soft. Bowel sounds are normal. NonTTP, no distension.  Musculoskeletal: Normal range of motion. Non ttp, no effusion.  Neurological: alert and oriented to person, place, and time.  Skin: Skin is warm. No rash noted. non diaphoretic.  Psychiatric: normal mood and affect. behavior is normal. Judgment and thought content normal.   ED Course  Procedures (including critical care time)  Labs Review Labs Reviewed - No data to display  Imaging Review No results found.   Visual Acuity Review  Right Eye Distance:   Left Eye Distance:   Bilateral Distance:    Right Eye Near:   Left Eye Near:    Bilateral Near:         MDM   1. Diarrhea, unspecified type   2. Hypertensive urgency    Viral gastro.  Supportive measures Strongly encouraged pt to take BP meds. No evidence of hypertensive emergency.      Waldemar Dickens, MD 12/16/14 [phone]

## 2014-12-21 ENCOUNTER — Encounter: Payer: Self-pay | Admitting: Family Medicine

## 2014-12-21 ENCOUNTER — Ambulatory Visit: Payer: Medicare Other | Admitting: Podiatry

## 2014-12-23 ENCOUNTER — Inpatient Hospital Stay: Admission: RE | Admit: 2014-12-23 | Payer: Medicare Other | Source: Ambulatory Visit

## 2014-12-24 ENCOUNTER — Ambulatory Visit: Payer: Medicare Other | Admitting: Family Medicine

## 2014-12-27 ENCOUNTER — Other Ambulatory Visit: Payer: Self-pay | Admitting: *Deleted

## 2014-12-27 MED ORDER — LISINOPRIL 40 MG PO TABS: 40.0000 mg | ORAL_TABLET | Freq: Every day | ORAL | Status: DC

## 2014-12-31 LAB — HM DIABETES EYE EXAM

## 2015-01-12 ENCOUNTER — Encounter: Payer: Self-pay | Admitting: Family Medicine

## 2015-01-12 ENCOUNTER — Ambulatory Visit (INDEPENDENT_AMBULATORY_CARE_PROVIDER_SITE_OTHER): Payer: Medicare Other | Admitting: Family Medicine

## 2015-01-12 VITALS — BP 132/74 | HR 81 | Temp 98.9°F | Ht 69.0 in | Wt 196.0 lb

## 2015-01-12 DIAGNOSIS — E1149 Type 2 diabetes mellitus with other diabetic neurological complication: Secondary | ICD-10-CM | POA: Diagnosis not present

## 2015-01-12 DIAGNOSIS — G8929 Other chronic pain: Secondary | ICD-10-CM

## 2015-01-12 DIAGNOSIS — Z23 Encounter for immunization: Secondary | ICD-10-CM | POA: Diagnosis not present

## 2015-01-12 DIAGNOSIS — G20 Parkinson's disease: Secondary | ICD-10-CM

## 2015-01-12 DIAGNOSIS — I10 Essential (primary) hypertension: Secondary | ICD-10-CM

## 2015-01-12 LAB — POCT GLYCOSYLATED HEMOGLOBIN (HGB A1C): Hemoglobin A1C: 6.3

## 2015-01-12 MED ORDER — HYDROCODONE-ACETAMINOPHEN 7.5-325 MG PO TABS: 1.0000 | ORAL_TABLET | Freq: Three times a day (TID) | ORAL | Status: DC | PRN

## 2015-01-12 MED ORDER — PREGABALIN 50 MG PO CAPS: 50.0000 mg | ORAL_CAPSULE | Freq: Three times a day (TID) | ORAL | Status: DC

## 2015-01-12 NOTE — Progress Notes (Signed)
    Subjective    Tyler Lester is a 69 y.o. male that presents for a follow-up visit for chronic issues.   1. Back pain with sciatica: Chronic. He is taking Norco 7.5-325mg  and Lyrica 50mg  taking it once per day instead of three times per day.  2. Hypertension: Chronic. Patient states he is adherent with hydrochlorothiazide and lisinopril. No chest pain or dyspnea.  3. Parkinson disease: He states he has been adherent with his medications but he cannot remember the names.   4. Diabetes mellitis: patient has not been on metformin for months since his A1C has been less than 6. He is currently not checking his blood sugars. He is not adhering to a low carb diet  Social History  Substance Use Topics  . Smoking status: Never Smoker   . Smokeless tobacco: Never Used  . Alcohol Use: Yes     Comment: rare alcohol /beer    Allergies  Allergen Reactions  . Poison Ivy Treatments Hives    No orders of the defined types were placed in this encounter.    ROS  Per HPI   Objective   BP 132/74 mmHg  Pulse 81  Temp(Src) 98.9 F (37.2 C) (Oral)  Ht 5\' 9"  (1.753 m)  Wt 196 lb (88.905 kg)  BMI 28.93 kg/m2  SpO2 99%  General: Fair appearing Neuro: Resting tremor in hands and mouth Psych: Flat affect  Assessment and Plan    No problem-specific assessment & plan notes found for this encounter.

## 2015-01-12 NOTE — Patient Instructions (Signed)
Thank you for coming to see me today. It was a pleasure. Today we talked about:   Blood pressure: no changes to your medications  Back pain with sciatica: please take your Lyrica as prescribed. You are prescribed Lyrica 50mg  three times per day. I have also refills your Norco  Diabetes: your A1C is 6.3 and is creeping up. We will need to keep an eye on this to prevent you needing to go back on medication  Please make an appointment to see me in 3 months for follow-up.  If you have any questions or concerns, please do not hesitate to call the office at [phone].  Sincerely,  Cordelia Poche, MD

## 2015-01-17 NOTE — Assessment & Plan Note (Signed)
Controlled. No medication changes

## 2015-01-17 NOTE — Assessment & Plan Note (Signed)
This is to treat patient's back pain. Refill Norco

## 2015-01-17 NOTE — Assessment & Plan Note (Signed)
A1C at 6.3. No change to management as patient is still within goal.

## 2015-01-17 NOTE — Assessment & Plan Note (Signed)
Patient still with symptoms. Discussed following up with neurologist.

## 2015-01-18 ENCOUNTER — Telehealth: Payer: Self-pay | Admitting: Family Medicine

## 2015-01-18 NOTE — Telephone Encounter (Signed)
Sharyn Lull is requesting a copy of patient's med list and diagnoses to add to patient's SRS to get approval for CAP, would like this faxed [phone], Attn: Fredrich Birks. Will send this over for Dr. Lonny Prude to review and sign off on.

## 2015-01-19 NOTE — Telephone Encounter (Signed)
Information printed and faxed to michelle. Jazmin Hartsell,CMA

## 2015-01-25 ENCOUNTER — Other Ambulatory Visit: Payer: Self-pay | Admitting: *Deleted

## 2015-01-26 ENCOUNTER — Other Ambulatory Visit: Payer: Self-pay | Admitting: *Deleted

## 2015-01-28 ENCOUNTER — Other Ambulatory Visit: Payer: Self-pay | Admitting: *Deleted

## 2015-01-28 MED ORDER — VENLAFAXINE HCL ER 37.5 MG PO CP24: 37.5000 mg | ORAL_CAPSULE | Freq: Every day | ORAL | Status: DC

## 2015-01-28 NOTE — Telephone Encounter (Signed)
Patient should have most of these with refills. Refill for rejected not appropriate. Finasteride was prescribed once and I'm not sure why. Will not refill without discussing at next office visit.

## 2015-02-10 ENCOUNTER — Other Ambulatory Visit: Payer: Self-pay | Admitting: *Deleted

## 2015-02-10 NOTE — Telephone Encounter (Signed)
Pt informed. Sharon T Saunders, CMA  

## 2015-02-10 NOTE — Telephone Encounter (Signed)
Refill request filled and placed in to be faxed pile.

## 2015-02-10 NOTE — Telephone Encounter (Signed)
Rx for Lyrica was printed at office visit on 01/12/2015. Patient is now requesting Lyrica through [employer], [facility]. Fax request placed in Dr. Lisbeth Ply box. Velora Heckler, RN

## 2015-02-14 ENCOUNTER — Other Ambulatory Visit: Payer: Self-pay | Admitting: *Deleted

## 2015-02-15 ENCOUNTER — Other Ambulatory Visit: Payer: Self-pay | Admitting: Podiatry

## 2015-02-15 ENCOUNTER — Ambulatory Visit (INDEPENDENT_AMBULATORY_CARE_PROVIDER_SITE_OTHER): Payer: Medicare Other | Admitting: Neurology

## 2015-02-15 ENCOUNTER — Encounter: Payer: Self-pay | Admitting: Neurology

## 2015-02-15 VITALS — BP 148/75 | HR 80 | Ht 69.0 in | Wt 202.2 lb

## 2015-02-15 DIAGNOSIS — R413 Other amnesia: Secondary | ICD-10-CM

## 2015-02-15 DIAGNOSIS — W19XXXD Unspecified fall, subsequent encounter: Secondary | ICD-10-CM

## 2015-02-15 DIAGNOSIS — G20 Parkinson's disease: Secondary | ICD-10-CM

## 2015-02-15 MED ORDER — CARBIDOPA-LEVODOPA 25-100 MG PO TABS: 1.0000 | ORAL_TABLET | Freq: Three times a day (TID) | ORAL | Status: DC

## 2015-02-15 NOTE — Patient Instructions (Signed)
Remember to drink plenty of fluid, eat healthy meals and do not skip any meals. Try to eat protein with a every meal and eat a healthy snack such as fruit or nuts in between meals. Try to keep a regular sleep-wake schedule and try to exercise daily, particularly in the form of walking, 20-30 minutes a day, if you can.   As far as your medications are concerned, I would like to suggest Carbidopa/Levodopa 1 pill three times a day. 1 tab every 4 hours. Take 4 hours apart at 8am, noon and 4pm for example.  Try to separate Sinemet from food (especially protein-rich foods like meat, dairy, eggs) by about 30-60 mins - this will help the absorption of the medication. If you have some nausea with the medication, you can take it with some light food like crackers or ginger ale  I would like to see you back after [facility], sooner if we need to. Please call us with any interim questions, concerns, problems, updates or refill requests.   Our phone number is [phone]. We also have an after hours call service for urgent matters and there is a physician on-call for urgent questions. For any emergencies you know to call 911 or go to the nearest emergency room

## 2015-02-15 NOTE — Progress Notes (Signed)
[facility]    Provider:  Dr Jaynee Eagles Referring Provider: Mariel Aloe, MD Primary Care Physician:  Cordelia Poche, MD  [facility]   Provider: Dr Jaynee Eagles Referring Provider: Cordelia Poche, MD Primary Care Physician: Cordelia Poche, MD  CC: Parkinsonism, memory loss, gait ataxia, non-compliance  HPI: Tyler Lester is a 69 y.o. male here as a follow up for Parkinsonism, memory loss, gait ataxia, non-compliance. Memory problems started years ago. The tremor is more at rest. And the jaw trembles as well.  Feels like his voice is getting softer. He has vivid dreams. He has drool on his pillow. He has fallen a few times and he catches himslef. He has loss of smell and taste. He can't smell dinner. He is swallowing OK. His handwriting has gotten smaller, when he writes memos he has noticed he has gotten smaller.   Interval history 02/15/2015: He does not know what medications he is taking. He has refused [facility] or home health care to help him with medications. He is a poor historian. I have asked him multiple times to bring in his medication bottles and he always forgets. i had a frank conversation with patient, I cannot manage his parkinsonism in this manner. He is supposed to be on Sinemet and Azilect. Called the pharmacy and he has not filled [place] since July. Sinemet was last filled on 12/5 but before then he filled it in September for a month. Not compliant.  Interval history 11/17/2014: He is having neck pain. He was scheduled today to discuss memory loss and he was supposed to bring a family member but he forgot. He would like pain medication and gets Vicadin but Dr. Lonny Prude won't give hm anymore because he had alcohol in his urine. He is not wearing his neupro patch today. I am not sure he is taking his medications. He declines any services to help him in the house. He has not started the Lyrica. He never got the MRI of the brain completed that I  ordered last December. He says the neupro patch doesn't work and gives him a lot of urges to have sex.  Interval History 03/22/2014: the tremor continues. He appears to be a poor historian and doesn't quite know what medications he is taking. He is not having any side effects from the Azilect and says he believes he is taking it. He also has neck pain and low back pain. He is on pain medication and is waiting to be seen by pain management. He had recent cervical spine surgery and has had lumber spine surgery in the past. He is having shooting pain from low back into right leg that is chronic and he is being managed by orthopaedic doctor. The orthopedist cancelled his pain medication because he was already getting vicodin from dr. Teryl Lucy and patient wants to know if I can give him the percocet instead of vicodin. He is only taking 277m three times daily (at first he said just once daily) of neurontin. No side effects from the gabapentin.   02/03/2014: Tyler NILLis a 69y.o. male here as a referral from Dr. NLonny Prudefor Tremor. PMHx cervical fusion (September), chronic back pain, DM, HTN, fatigue, depression, anxiety, myelopathy.   Was sent here by Dr. NTeryl Lucyfor evaluation of tremor. He has a tremor in his left hand and his jaw. Tremor has only been going on for at least a year but worse in the last 3 months. Getting worse after neck surgery.  Worse in the left but also in the right hand as well as the chin. He can't walk, he has chronic pain in his low back and neck. Walking slow, doesn't have energy. No tremors in other family members. Doesn't notice the tremor getting better with alcohol. No constipation. Can hardly smell. Sometimes he is talking low, softer. Hand writing is shaky, smaller. Feels weak. TSH was normal in June. Does not drink caffeine. 1-2 drinks a week, no significant alcohol. No FHx of parkinson's or neurodegenerative disease. Has had falls.   Review of Systems: Patient complains of  symptoms per HPI as well as the following symptoms: No CP, no SOB. Pertinent negatives per HPI. All others negative.   Social History   Social History  . Marital Status: Single    Spouse Name: N/A  . Number of Children: 2  . Years of Education: 12   Occupational History  . Retired    . Disability    Social History Main Topics  . Smoking status: Never Smoker   . Smokeless tobacco: Never Used  . Alcohol Use: Yes     Comment: rare alcohol /beer  . Drug Use: No  . Sexual Activity: Not on file   Other Topics Concern  . Not on file   Social History Narrative   Patient lives at home alone.    Patient have 2 children.    Patient has a high school education    Patient is right handed.    Patient is retired on disability.        Family History  Problem Relation Age of Onset  . Cancer - Other Other   . Diabetes Other   . Hypertension Other     Past Medical History  Diagnosis Date  . Diabetes mellitus   . Chronic pain   . Hypertension   . Hyperlipidemia   . Sleep apnea   . Shortness of breath   . Inguinal hernia     right  . Pneumonia   . Numbness     T/O  . Bilateral foot pain     Past Surgical History  Procedure Laterality Date  . Colon surgery    . Cataract extraction w/ intraocular lens implant Left 06/2012    laser for posterior capsule?  Marland Kitchen Tonsillectomy    . Posterior cervical fusion/foraminotomy N/A 11/12/2013    Procedure: Posterior cervical decompression fusion, cervical 3-4, cervical 4-5, cervical 5-6, cervical 6-7 with instrumentation and allograft   (LEVEL 4);  Surgeon: Sinclair Ship, MD;  Location: [place];  Service: Orthopedics;  Laterality: N/A;  Posterior cervical decompression fusion, cervical 3-4, cervical 4-5, cervical 5-6, cervical 6-7 with instrumentation and allograft    Current Outpatient Prescriptions  Medication Sig Dispense Refill  . atorvastatin (LIPITOR) 20 MG tablet Take 1 tablet (20 mg total) by mouth daily. 90 tablet 3  .  Blood Glucose Monitoring Suppl (ONE TOUCH ULTRA MINI) W/DEVICE KIT     . carbidopa-levodopa (SINEMET) 25-100 MG per tablet Take 0.5 tablets by mouth 3 (three) times daily. 1/2 tablet po at bedtime. Can repeat additional 1/2 tablet later in evening as needed 45 tablet 6  . fluorometholone (FML) 0.1 % ophthalmic suspension   0  . HYDROcodone-acetaminophen (NORCO) 7.5-325 MG tablet Take 1 tablet by mouth every 8 (eight) hours as needed for moderate pain. 90 tablet 0  . lisinopril (PRINIVIL,ZESTRIL) 40 MG tablet Take 1 tablet (40 mg total) by mouth daily. 30 tablet 2  . ONE TOUCH ULTRA  TEST test strip     . ONETOUCH DELICA LANCETS FINE MISC     . silver sulfADIAZINE (SILVADENE) 1 % cream Apply 1 application topically daily. 50 g 0  . tamsulosin (FLOMAX) 0.4 MG CAPS capsule Take 1 capsule (0.4 mg total) by mouth daily after supper. 30 capsule 0  . venlafaxine XR (EFFEXOR-XR) 37.5 MG 24 hr capsule Take 1 capsule (37.5 mg total) by mouth daily. 30 capsule 2  . Cholecalciferol (VITAMIN D3) 400 UNITS tablet Take 2 tablets (800 Units total) by mouth daily. (Patient not taking: Reported on 10/28/2014) 60 tablet 1  . docusate sodium (COLACE) 100 MG capsule Take 1 capsule (100 mg total) by mouth 2 (two) times daily. (Patient not taking: Reported on 10/19/2014) 10 capsule 0  . finasteride (PROSCAR) 5 MG tablet Take 5 mg by mouth daily. Reported on 02/15/2015  0  . hydrochlorothiazide (HYDRODIURIL) 25 MG tablet Take 25 mg by mouth daily. Reported on 02/15/2015    . ipratropium (ATROVENT) 0.06 % nasal spray Place 2 sprays into both nostrils 4 (four) times daily. (Patient not taking: Reported on 10/19/2014) 15 mL 12  . potassium chloride SA (K-DUR,KLOR-CON) 20 MEQ tablet Take 1 tablet (20 mEq total) by mouth daily. (Patient not taking: Reported on 02/15/2015) 4 tablet 0  . pregabalin (LYRICA) 50 MG capsule Take 1 capsule (50 mg total) by mouth 3 (three) times daily. (Patient not taking: Reported on 02/15/2015) 90 capsule  2  . rasagiline (AZILECT) 1 MG TABS tablet Take 1 tablet (1 mg total) by mouth daily. (Patient not taking: Reported on 02/15/2015) 90 tablet 6  . senna (SENOKOT) 8.6 MG TABS tablet Take 1 tablet (8.6 mg total) by mouth 2 (two) times daily. (Patient not taking: Reported on 10/19/2014) 120 each 0   No current facility-administered medications for this visit.    Allergies as of 02/15/2015 - Review Complete 12/23/2014  Allergen Reaction Noted  . Poison ivy treatments Hives 05/14/2014    Vitals: BP 148/75 mmHg  Pulse 80  Ht 5' 9"  (1.753 m)  Wt 202 lb 3.2 oz (91.717 kg)  BMI 29.85 kg/m2 Last Weight:  Wt Readings from Last 1 Encounters:  02/15/15 202 lb 3.2 oz (91.717 kg)   Last Height:   Ht Readings from Last 1 Encounters:  02/15/15 5' 9"  (1.753 m)     Speech: Mildly hypophonic. No aphasia, not dysarthric Cognition:  The patient is oriented to person, place, and time;   Cranial Nerves:  The pupils are equal, round, and reactive to light. Visual fields are full to finger confrontation. Extraocular movements are intact. Trigeminal sensation is intact and the muscles of mastication are normal. The face is symmetric. The palate elevates in the midline. Voice is normal. Shoulder shrug is normal. The tongue has normal motion without fasciculations.   Coordination: Bradykinesias left > right finger tapping, slow but intact FTN, no dysmetria  Gait: antalgic. Difficult to assess for shuffling and arm swing because he has chronic pain, recent neck surgery, chronic low back pain and uses a cane.   Motor Observation: Left>R resting tremor and chin tremor. Decreased blink. Postural tremor.    Tone: Mild cogwheeling with facilitation.  Assessment/Plan: 69 year old male here for evaluation of parkinsonism and memory loss. PMHx cervical fusion (September), chronic back pain, DM, HTN, fatigue, depression, anxiety, myelopathy. Has some parkinsonian features, left > right resting  tremor, bradykinesia left > right on finger tapping, mild hypophonia, masked facies. Mild cogwheeling with facilitation. Antalgic gait with  small strides however difficult to assess for shuffling or arm swing because he has chronic pain, recent neck surgery, chronic low back pain, neuropathy and uses a cane.   He is a poor historian and I am not entirely convinced he knows which medications he is taking or if he is taking his medications at all . I have asked him to bring his medications with him multiple times but he forget them today again.  MRI of the brain - ordered but he did not have it done. Ordered it again.   Azilect - hasn't filled is since July. Will increase Sinemet and not restart Azilect at this time. Sinemet - called pharmacy,not filling it regularly.  Non compliance: I encouraged him to let me contact an agency, [facility], to see if we can get help at home with medications but he declines multiple times. Today I told him I can't help him or manage his medications or conditions since he is noncompliant unless he gets help and he has agreed to try with [facility]. If he declines when [facility] calls, patient will need to follow up with primary care instead of neurology. Needs help with medication management, nursing support, physical therapy for gait ataxia and falls and memory loss.  Parkinsonism: Sinemet 1 tab every 4 hours. Take 4 hours apart at 8am, noon and 4pm for example. Do not take at the same time as protein.  Try to separate Sinemet from food (especially protein-rich foods like meat, dairy, eggs) by about 30-60 mins - this will help the absorption of the medication. If you have some nausea with the medication, you can take it with some light food like crackers or ginger ale   Sarina Ill, MD  [facility] [address] [place], [place] [address]  Phone [phone] Fax [phone]  A total of 45 minutes was spent face-to-face with this  patient. Over half this time was spent on counseling patient on the parkinsonism,  diagnosis and different diagnostic and therapeutic options available.

## 2015-02-16 ENCOUNTER — Telehealth: Payer: Self-pay | Admitting: *Deleted

## 2015-02-16 NOTE — Telephone Encounter (Signed)
Refilled Silvadene Cream and informed pt, also instructed pt to make an appt to be seen for the wound to his foot.  Pt agreed and was transferred to schedulers.

## 2015-02-16 NOTE — Telephone Encounter (Signed)
Pt needs an appt prior to future refills. 

## 2015-02-17 ENCOUNTER — Telehealth: Payer: Self-pay | Admitting: Family Medicine

## 2015-02-17 NOTE — Telephone Encounter (Signed)
Patient states he does not like the One Touch Ultra glucometer that her received and would like to get an Accucheck with test strips and lancets. Please advise.

## 2015-02-22 ENCOUNTER — Telehealth: Payer: Self-pay | Admitting: Neurology

## 2015-02-22 NOTE — Telephone Encounter (Signed)
Roselie Awkward with [facility] evaluated the patient last week and his recommendation is for PT 2 times a week for 2 weeks, then once a week for 2 weeks. Please call with verbal order.  Thanks!

## 2015-02-24 ENCOUNTER — Other Ambulatory Visit: Payer: Self-pay | Admitting: *Deleted

## 2015-02-24 NOTE — Telephone Encounter (Signed)
Called back. Ok per Dr Jaynee Eagles to do PT 2x/week for 2 weeks and 1x/week for 2 weeks. He verbalized understanding.

## 2015-02-24 NOTE — Telephone Encounter (Signed)
Pt is checking the status of his request for a Accucheck and test strips. jw

## 2015-02-25 MED ORDER — LISINOPRIL 40 MG PO TABS: 40.0000 mg | ORAL_TABLET | Freq: Every day | ORAL | Status: DC

## 2015-03-02 ENCOUNTER — Telehealth: Payer: Self-pay | Admitting: Neurology

## 2015-03-02 DIAGNOSIS — G20 Parkinson's disease: Secondary | ICD-10-CM

## 2015-03-02 DIAGNOSIS — W19XXXD Unspecified fall, subsequent encounter: Secondary | ICD-10-CM

## 2015-03-02 NOTE — Telephone Encounter (Signed)
LVM returning call from [place]. Asked her to call back. Gave [facility] phone number.

## 2015-03-02 NOTE — Telephone Encounter (Signed)
Mya with [facility] is calling regarding the patient. Mya is requesting to get an order for a rollator and a tub transfer bench for the patient. Please call Mya with a verbal order and also send the order to [place] with the patient's demographic sheet to fax#[phone]. Thank you.

## 2015-03-02 NOTE — Telephone Encounter (Signed)
Per Dr Jaynee Eagles- ok to give verbal order for rollator and tub transfer bench

## 2015-03-08 NOTE — Telephone Encounter (Signed)
Thought I had replied to this message, but appears not. Patient's insurance covers OneTouch. It does not cover Accu-chek, If patient is okay with this, I will call it in, however, he should likely stick with the OneTouch. Please inform.

## 2015-03-09 NOTE — Telephone Encounter (Signed)
LVM for Mya again. Gave [facility] phone number for her to call back.

## 2015-03-09 NOTE — Telephone Encounter (Signed)
LVM for pt to return call to discuss below. Zimmerman Rumple, Maggy Wyble D, CMA  

## 2015-03-11 ENCOUNTER — Telehealth: Payer: Self-pay | Admitting: Family Medicine

## 2015-03-11 ENCOUNTER — Encounter: Payer: Self-pay | Admitting: *Deleted

## 2015-03-11 NOTE — Telephone Encounter (Signed)
Form dropped off to be filled out for aid.  Please call worker to pick up when completed.

## 2015-03-11 NOTE — Progress Notes (Signed)
Faxed completed form to [place] from [facility] to [phone] as requested. (Request for independent assessment for personal care services) Received confirmation. Sent copy to MR.

## 2015-03-14 NOTE — Telephone Encounter (Signed)
Tried calling Tyler Lester again. No answer. LVM for her to call back. Advised I have left 2 previous messages returning call. Gave [facility] phone number and hours.

## 2015-03-14 NOTE — Telephone Encounter (Signed)
Called main office number for [facility] home health since unable to reach Tyler Lester. They tried reaching Tyler Lester but it went straight to VM as well. Spoke to [employer]. She will send message to Tyler Lester to try and reach her.

## 2015-03-15 ENCOUNTER — Encounter: Payer: Self-pay | Admitting: *Deleted

## 2015-03-15 NOTE — Progress Notes (Signed)
Faxed signed paperwork back to [facility] health dated 02/17/15, 02/25/15, and 02/28/15. Received confirmation. FaxNZ:[phone]. Sent copy to MR.

## 2015-03-15 NOTE — Progress Notes (Signed)
Re-faxed paperwork with completed section D as requested. Received confirmation. Sent copy to MR.

## 2015-03-15 NOTE — Telephone Encounter (Signed)
LVM for pt to call our office to discuss information below. Ottis Stain, CMA

## 2015-03-16 ENCOUNTER — Telehealth: Payer: Self-pay | Admitting: Family Medicine

## 2015-03-16 NOTE — Telephone Encounter (Signed)
Called main office number again.Spoke to [place] again. I have not heard back from Mya yet. Asked if someone else can take over care for pt. Or help with what Mya was requesting. She placed me on hold. She stated she is also going to try calling Mya and sending her an email again to get in touch with me ASAP. She took my name and number.

## 2015-03-16 NOTE — Telephone Encounter (Signed)
Mya from [facility] home health called office back. She stated orders for 4 wheel-rollator and tub transfer bench with back support needs to be faxed to [facility] for pt to receive requested supplies. Verified Mya's number and advised I will fax over orders.

## 2015-03-16 NOTE — Telephone Encounter (Signed)
Pt needs a nurse to return his call to discuss symptoms. Pt experiencing shortness of breath when lying down and he reports that his heart "feels like it is fluttering." Please advise asap. Sadie Reynolds, ASA

## 2015-03-16 NOTE — Telephone Encounter (Signed)
Appt with PCP tomorrow 03/17/15 at 1:30 PM.  Derl Barrow, RN

## 2015-03-16 NOTE — Telephone Encounter (Signed)
Form placed in PCP box. Zimmerman Rumple, Finnbar Cedillos D, CMA  

## 2015-03-16 NOTE — Telephone Encounter (Signed)
Faxed orders and received confirmation.

## 2015-03-16 NOTE — Telephone Encounter (Signed)
Pt calling to check on status of this request. Tyler Lester, ASA ° °

## 2015-03-16 NOTE — Telephone Encounter (Signed)
Left message for patient to call for an appointment or go to ED.  Derl Barrow, RN

## 2015-03-17 ENCOUNTER — Encounter: Payer: Self-pay | Admitting: Family Medicine

## 2015-03-17 ENCOUNTER — Ambulatory Visit (INDEPENDENT_AMBULATORY_CARE_PROVIDER_SITE_OTHER): Payer: Medicare Other | Admitting: Family Medicine

## 2015-03-17 VITALS — BP 128/80 | HR 87 | Temp 98.3°F | Ht 69.0 in | Wt 202.4 lb

## 2015-03-17 DIAGNOSIS — Z1159 Encounter for screening for other viral diseases: Secondary | ICD-10-CM

## 2015-03-17 DIAGNOSIS — I10 Essential (primary) hypertension: Secondary | ICD-10-CM

## 2015-03-17 DIAGNOSIS — Z23 Encounter for immunization: Secondary | ICD-10-CM

## 2015-03-17 DIAGNOSIS — R0602 Shortness of breath: Secondary | ICD-10-CM | POA: Diagnosis not present

## 2015-03-17 DIAGNOSIS — R269 Unspecified abnormalities of gait and mobility: Secondary | ICD-10-CM

## 2015-03-17 LAB — BASIC METABOLIC PANEL WITH GFR
BUN: 16 mg/dL (ref 7–25)
CO2: 33 mmol/L — ABNORMAL HIGH (ref 20–31)
Calcium: 9.3 mg/dL (ref 8.6–10.3)
Chloride: 99 mmol/L (ref 98–110)
Creat: 1.28 mg/dL — ABNORMAL HIGH (ref 0.70–1.25)
GFR, Est African American: 67 mL/min (ref >=60)
GFR, Est Non African American: 58 mL/min — ABNORMAL LOW (ref >=60)
Glucose, Bld: 102 mg/dL — ABNORMAL HIGH (ref 65–99)
Potassium: 3.7 mmol/L (ref 3.5–5.3)
Sodium: 142 mmol/L (ref 135–146)

## 2015-03-17 NOTE — Telephone Encounter (Signed)
Patient to see me regarding this form. Was not able to complete today secondary to problems brought up during visit.

## 2015-03-17 NOTE — Patient Instructions (Addendum)
Thank you for coming to see me today. It was a pleasure. Today we talked about:   Shortness of breath: I will refer you for a sleep study. Please do not take your Norco after 6pm as this can make your breathing shallow.  Foot tripping: Please ask your physical therapist to look at this for you. It does not appear that you have any foot drop. This may be related to your Parkinson diagnosis. If any falls, please follow-up sooner  High blood pressure: controlled today. No changes. I will check your kidney function today  Hepatitis C screening: I will check this today.  Pneumonia: You will get this shot today.  Please make an appointment to see me in 1 months for follow-up of Diabetes.  If you have any questions or concerns, please do not hesitate to call the office at [phone].  Sincerely,  Cordelia Poche, MD

## 2015-03-17 NOTE — Progress Notes (Signed)
    Subjective    Tyler Lester is a 70 y.o. male that presents for a follow-up visit for chronic issues.   1. Breathing issues: Symptoms started about one month ago and has remained unchanged. Symptoms occuring at night with the feeling of being short-winded. Symptoms improve when he sits up. He sometimes hears his heart beat but does not endorse. He sleeps on one pillow. He reports no swelling in his legs, although has some mild swelling in his left hand. He reports no chest pain or diaphoresis associated with symptoms. He reports daytime somnolence. He reports a history of sleep apnea. He states some people say he snores. He usually takes his Norco three times per day, latest around 11pm. He has some intermittent, mild non-productive cough with no sneezing or rhinorrhea. No fevers.  2. Tripping over left foot: This has been an issue for a few months. He reports issues with his left foot only. He has found that he starts to drag his left foot at times.   3. Hypertension: Adherent with lisinopril 40mg  and hydrochlorothiazide 25mg , although he could not name the hctz. He reports taking his medication this morning.   Social History  Substance Use Topics  . Smoking status: Never Smoker   . Smokeless tobacco: Never Used  . Alcohol Use: Yes     Comment: rare alcohol /beer    Allergies  Allergen Reactions  . Poison Ivy Treatments Hives    No orders of the defined types were placed in this encounter.    ROS  Per HPI   Objective   BP 128/80 mmHg  Pulse 87  Temp(Src) 98.3 F (36.8 C) (Oral)  Ht 5\' 9"  (1.753 m)  Wt 202 lb 6.4 oz (91.808 kg)  BMI 29.88 kg/m2  SpO2 98%  General: Well appearing, no distress HEENT:   Nose/Throat: Nares patent bilaterally. Oropharnx clear and moist.  Neck: No cervical adenopathy bilaterally Respiratory/Chest: Clear to auscultation bilaterally. Unlabored work of breathing. No wheezing or rales. Cardiovascular: Regular rate and rhythm. Normal S1 and  S2. No heart murmurs present. No extra heart sounds. No JVD. Musculoskeletal: Symmetrical leg length, gait appears slightly shuffled with adequate dorsiflexion. 5/5 dorsiflexion/plantarflexion bilaterally.   Assessment and Plan    Shortness of breath Does not seem consistent with orthopnea. Would favor possible sleep apnea. No wheezes or history of asthma. Will refer for sleep study. Return precautions discussed.  Gait abnormality Gait appears measured, however, no issues with dorsiflexion noted. Reassured patient. Follow-up if returns/worsens/  Essential hypertension Controlled. No changes to regimen.

## 2015-03-18 LAB — HEPATITIS C ANTIBODY: HCV Ab: NEGATIVE

## 2015-03-21 ENCOUNTER — Encounter: Payer: Self-pay | Admitting: *Deleted

## 2015-03-21 ENCOUNTER — Telehealth: Payer: Self-pay | Admitting: Family Medicine

## 2015-03-21 NOTE — Telephone Encounter (Signed)
Pt   " cant hold his bowels".  Please advise

## 2015-03-21 NOTE — Progress Notes (Signed)
Faxed signed order by Dr Jaynee Eagles for transfer bench back to [facility]. Received fax confirmation. FaxMV:[phone]. Sent copy to MR.

## 2015-03-23 ENCOUNTER — Telehealth: Payer: Self-pay | Admitting: *Deleted

## 2015-03-23 NOTE — Telephone Encounter (Signed)
Received refill request from [facility] for Diclofenac 1% gel, apply 1 gram to affected area 3-4 times a day.  This is not on his current med list, will forward to MD. Clinton Sawyer, Salome Spotted

## 2015-03-24 ENCOUNTER — Other Ambulatory Visit: Payer: Self-pay | Admitting: Family Medicine

## 2015-03-24 NOTE — Telephone Encounter (Signed)
Will not prescribe. Has not been prescribed in almost 3 years and patient has not discussed this with me.

## 2015-03-25 NOTE — Telephone Encounter (Signed)
LM for patient to call clinic back. Zorion Nims,CMA  

## 2015-03-25 NOTE — Telephone Encounter (Signed)
Patient called 1/26 and message left for him to return call

## 2015-03-28 ENCOUNTER — Other Ambulatory Visit: Payer: Self-pay | Admitting: *Deleted

## 2015-03-28 DIAGNOSIS — R0602 Shortness of breath: Secondary | ICD-10-CM | POA: Insufficient documentation

## 2015-03-28 DIAGNOSIS — R269 Unspecified abnormalities of gait and mobility: Secondary | ICD-10-CM | POA: Insufficient documentation

## 2015-03-28 NOTE — Assessment & Plan Note (Signed)
Gait appears measured, however, no issues with dorsiflexion noted. Reassured patient. Follow-up if returns/worsens/

## 2015-03-28 NOTE — Assessment & Plan Note (Signed)
Does not seem consistent with orthopnea. Would favor possible sleep apnea. No wheezes or history of asthma. Will refer for sleep study. Return precautions discussed.

## 2015-03-28 NOTE — Assessment & Plan Note (Signed)
Controlled. No changes to regimen 

## 2015-04-01 ENCOUNTER — Ambulatory Visit (INDEPENDENT_AMBULATORY_CARE_PROVIDER_SITE_OTHER): Payer: Medicare Other | Admitting: Podiatry

## 2015-04-01 ENCOUNTER — Encounter: Payer: Self-pay | Admitting: Podiatry

## 2015-04-01 DIAGNOSIS — I739 Peripheral vascular disease, unspecified: Secondary | ICD-10-CM

## 2015-04-01 DIAGNOSIS — E1149 Type 2 diabetes mellitus with other diabetic neurological complication: Secondary | ICD-10-CM | POA: Diagnosis not present

## 2015-04-01 DIAGNOSIS — M79676 Pain in unspecified toe(s): Secondary | ICD-10-CM

## 2015-04-01 DIAGNOSIS — B351 Tinea unguium: Secondary | ICD-10-CM

## 2015-04-01 DIAGNOSIS — G629 Polyneuropathy, unspecified: Secondary | ICD-10-CM

## 2015-04-01 DIAGNOSIS — R2681 Unsteadiness on feet: Secondary | ICD-10-CM

## 2015-04-01 NOTE — Progress Notes (Signed)
Patient ID: Tyler Lester, male   DOB: 1945-07-18, 70 y.o.   MRN: [id number]  Subjective: 70 y.o.-year-old male returns the office today for painful, elongated, thickened toenails which he is unable to trim himself. He is also requesting new diabetic shoes. Denies any redness or drainage around the nails. He also states that he feels unsteady on his feet and not balanced and is concerned about falling. This is not new and has been ongoing for some time. Denies any acute changes since last appointment and no new complaints today. Denies any systemic complaints such as fevers, chills, nausea, vomiting.   Objective: AAO 3, NAD DP/PT pulses decreased, CRT less than 3 seconds Protective sensation decreased with Simms Weinstein monofilament Nails hypertrophic, dystrophic, elongated, brittle, discolored 10. There is tenderness overlying the nails 1-5 bilaterally. There is no surrounding erythema or drainage along the nail sites. No open lesions or pre-ulcerative lesions are identified bilaterally No other areas of tenderness bilateral lower extremities. No overlying edema, erythema, increased warmth. Hammertoe and HAV is present bilateral No pain with calf compression, swelling, warmth, erythema.  Assessment: Patient presents with symptomatic onychomycosis; no signs of infection at this time.   Plan: -Treatment options including alternatives, risks, complications were discussed -Nails sharply debrided 10 without complication/bleeding. -Diabetic shoes paperwork was completed today.  Also, may benefit from [place]. Will have him follow-up with Betha. -Discussed daily foot inspection. If there are any changes, to call the office immediately.  -Follow-up in 3 months or sooner if any problems are to arise. In the meantime, encouraged to call the office with any questions, concerns, changes symptoms.   Celesta Gentile, DPM

## 2015-04-05 MED ORDER — LISINOPRIL 40 MG PO TABS: 40.0000 mg | ORAL_TABLET | Freq: Every day | ORAL | Status: DC

## 2015-04-13 ENCOUNTER — Other Ambulatory Visit: Payer: Self-pay | Admitting: Family Medicine

## 2015-04-13 DIAGNOSIS — G8929 Other chronic pain: Secondary | ICD-10-CM

## 2015-04-13 NOTE — Telephone Encounter (Signed)
Pt called and would like a refill on his pain medication left up front. Please call him when ready to pick up. Also the patient has a request on [facility]. He could not do this away from his home. He was wondering if there is a way to do this at his house. Please call patient to discuss. jw

## 2015-04-18 MED ORDER — HYDROCODONE-ACETAMINOPHEN 7.5-325 MG PO TABS: 1.0000 | ORAL_TABLET | Freq: Three times a day (TID) | ORAL | Status: DC | PRN

## 2015-04-18 NOTE — Telephone Encounter (Signed)
LVM informing pt rx is up front ready for pick up. Ottis Stain, CMA

## 2015-04-18 NOTE — Telephone Encounter (Signed)
Patient calling again requesting pain medication

## 2015-04-18 NOTE — Telephone Encounter (Signed)
Medication available up front for pickup

## 2015-04-25 ENCOUNTER — Other Ambulatory Visit: Payer: Self-pay | Admitting: Family Medicine

## 2015-05-05 ENCOUNTER — Other Ambulatory Visit: Payer: Medicare Other

## 2015-05-12 ENCOUNTER — Ambulatory Visit: Payer: Medicare Other | Admitting: Family Medicine

## 2015-05-13 ENCOUNTER — Ambulatory Visit (INDEPENDENT_AMBULATORY_CARE_PROVIDER_SITE_OTHER): Payer: Medicare Other | Admitting: Family Medicine

## 2015-05-13 ENCOUNTER — Encounter: Payer: Self-pay | Admitting: Family Medicine

## 2015-05-13 VITALS — BP 166/81 | HR 89 | Temp 98.1°F | Wt 206.0 lb

## 2015-05-13 DIAGNOSIS — J329 Chronic sinusitis, unspecified: Secondary | ICD-10-CM | POA: Diagnosis not present

## 2015-05-13 DIAGNOSIS — B349 Viral infection, unspecified: Secondary | ICD-10-CM

## 2015-05-13 DIAGNOSIS — B9789 Other viral agents as the cause of diseases classified elsewhere: Secondary | ICD-10-CM

## 2015-05-13 NOTE — Patient Instructions (Signed)
Thanks for coming in today.   Take tylenol , and you may take Guifinesin (mucinex) for your congestion.   Drink plenty of water.   Follow up if your symptoms are not getting better, or you are getting worse.   Thanks for letting us take care of you.   Sincerely, Paula Compton, MD Family Medicine - PGY 2

## 2015-05-13 NOTE — Progress Notes (Signed)
Patient ID: Tyler Lester, male   DOB: [birth year], 70 y.o.   MRN: [id number]   [facility] Aquilla Hacker, MD Phone: [phone]  Subjective:   # Cough - has had for about 3 days.  - No fever or chills.  - He has had some chest discomfort with coughing. No heaviness, no pleurodynia.  - No runny nose.  - Coughing up green phlegm - No throat pain.  - No body aches.  - Has had a hard time catching his breath at times, but this has been going on since before he had this illness. No chest pain with exertion.  - Was in the ICU 30 years ago and had a trach.  - Sleeps on one pillow, + PND, but says that this is due to heart burn that he feels in his chest. He has a bad taste in his mouth with PND.  - No swollen lymph nodes.  - No ear pain.  - No vision changes, eye pain, or dizziness.  - Does feel dripping in the back of his throat.  - No headaches.  - Has had some sinus pressure. - Has been taking Nyquil with Pseudoephedrine in it.   All relevant systems were reviewed and were negative unless otherwise noted in the HPI  Past Medical History Reviewed problem list.  Medications- reviewed and updated Current Outpatient Prescriptions  Medication Sig Dispense Refill  . atorvastatin (LIPITOR) 20 MG tablet Take 1 tablet (20 mg total) by mouth daily. 90 tablet 3  . Blood Glucose Monitoring Suppl (ONE TOUCH ULTRA MINI) W/DEVICE KIT     . carbidopa-levodopa (SINEMET) 25-100 MG tablet Take 1 tablet by mouth 3 (three) times daily. Take 4 hours apart at 8am, noon and 4pm for example. Do not eat at the same time as protein. 90 tablet 6  . Cholecalciferol (VITAMIN D3) 400 UNITS tablet Take 2 tablets (800 Units total) by mouth daily. 60 tablet 1  . docusate sodium (COLACE) 100 MG capsule Take 1 capsule (100 mg total) by mouth 2 (two) times daily. 10 capsule 0  . finasteride (PROSCAR) 5 MG tablet Take 5 mg by mouth daily. Reported on 02/15/2015  0  . fluorometholone (FML) 0.1  % ophthalmic suspension   0  . hydrochlorothiazide (HYDRODIURIL) 25 MG tablet Take 25 mg by mouth daily. Reported on 02/15/2015    . HYDROcodone-acetaminophen (NORCO) 7.5-325 MG tablet Take 1 tablet by mouth every 8 (eight) hours as needed for moderate pain. 90 tablet 0  . ipratropium (ATROVENT) 0.06 % nasal spray Place 2 sprays into both nostrils 4 (four) times daily. 15 mL 12  . lisinopril (PRINIVIL,ZESTRIL) 40 MG tablet Take 1 tablet (40 mg total) by mouth daily. 30 tablet 2  . ONE TOUCH ULTRA TEST test strip     . ONETOUCH DELICA LANCETS FINE MISC     . potassium chloride SA (K-DUR,KLOR-CON) 20 MEQ tablet Take 1 tablet (20 mEq total) by mouth daily. 4 tablet 0  . pregabalin (LYRICA) 50 MG capsule Take 1 capsule (50 mg total) by mouth 3 (three) times daily. 90 capsule 2  . rasagiline (AZILECT) 1 MG TABS tablet Take 1 tablet (1 mg total) by mouth daily. 90 tablet 6  . senna (SENOKOT) 8.6 MG TABS tablet Take 1 tablet (8.6 mg total) by mouth 2 (two) times daily. 120 each 0  . SSD 1 % cream APPLY TOPICALLY DAILY 50 g 0  . tamsulosin (FLOMAX) 0.4 MG CAPS capsule Take  1 capsule (0.4 mg total) by mouth daily after supper. 30 capsule 0  . venlafaxine XR (EFFEXOR-XR) 37.5 MG 24 hr capsule take 1 capsule by mouth once daily 30 capsule 2   No current facility-administered medications for this visit.   Chief complaint-noted No additions to family history Social history- patient is a non smoker  Objective: BP 166/81 mmHg  Pulse 89  Temp(Src) 98.1 F (36.7 C) (Oral)  Wt 206 lb (93.441 kg)  SpO2 100% Gen: NAD, alert, cooperative with exam HEENT: NCAT, EOMI, PERRL, TMs nml, O/P clear with slight erythema, no exudates. No lymph nodes. Nares with dried discharge.  Neck: FROM, supple, no lymph nodes palpable.  CV: RRR, good S1/S2, no murmur Resp: CTABL, no wheezes, non-labored, appropriate rate.  Abd: SNTND, BS present, no guarding or organomegaly Ext: No edema, warm, normal tone, moves UE/LE  spontaneously Neuro: Alert and oriented, No gross deficits Skin: no rashes no lesions  Assessment/Plan:  # Viral URI / Sinusitis - pt. With viral sinusitis. Post-nasal drip.  - Supportive care with Tylenol and drink plenty of fluids.  - Take guifinesin to get rid of the mucus.  - If you develop any fever > 100.4 F, or if your symptoms worsen, or if you develop difficulty breathing, then return for follow up and further evaluation.  - F/U with PCP as needed.  - Avoid alpha agonist products.   # HTN - elevated today. Related to taking pseudoephedrine products.  - Avoid these.  - Take BP meds as prescribed.   # PND -  Suspect this is due to GERD.  - Will send in Prilosec.  - Reevaluate in 60-90 days for discontinuation / improvement.   # Dyspnea on Exertion - pt. Has been having this for some time. He is a diabetic. Last Echo 2015 was normal. He says he gets intermittent ankle swelling.  - No emergent workup needed.  - would get Echo vs. Outpatient stress test as well upon follow up.  - See Dr. Teryl Lucy in 2-3 months.

## 2015-05-19 ENCOUNTER — Ambulatory Visit (INDEPENDENT_AMBULATORY_CARE_PROVIDER_SITE_OTHER): Payer: Medicare Other | Admitting: Family Medicine

## 2015-05-19 ENCOUNTER — Encounter: Payer: Self-pay | Admitting: Family Medicine

## 2015-05-19 VITALS — BP 150/82 | HR 80 | Temp 97.4°F | Ht 69.0 in | Wt 203.0 lb

## 2015-05-19 DIAGNOSIS — M5441 Lumbago with sciatica, right side: Secondary | ICD-10-CM

## 2015-05-19 DIAGNOSIS — E1149 Type 2 diabetes mellitus with other diabetic neurological complication: Secondary | ICD-10-CM | POA: Diagnosis not present

## 2015-05-19 DIAGNOSIS — R05 Cough: Secondary | ICD-10-CM

## 2015-05-19 DIAGNOSIS — G8929 Other chronic pain: Secondary | ICD-10-CM

## 2015-05-19 DIAGNOSIS — Z1211 Encounter for screening for malignant neoplasm of colon: Secondary | ICD-10-CM | POA: Diagnosis not present

## 2015-05-19 DIAGNOSIS — R059 Cough, unspecified: Secondary | ICD-10-CM

## 2015-05-19 DIAGNOSIS — I10 Essential (primary) hypertension: Secondary | ICD-10-CM

## 2015-05-19 LAB — POCT GLYCOSYLATED HEMOGLOBIN (HGB A1C): Hemoglobin A1C: 6.5

## 2015-05-19 MED ORDER — HYDROCODONE-ACETAMINOPHEN 7.5-325 MG PO TABS: 1.0000 | ORAL_TABLET | Freq: Three times a day (TID) | ORAL | Status: DC | PRN

## 2015-05-19 MED ORDER — GUAIFENESIN 200 MG PO TABS: 200.0000 mg | ORAL_TABLET | Freq: Four times a day (QID) | ORAL | Status: DC | PRN

## 2015-05-19 MED ORDER — PREGABALIN 50 MG PO CAPS: 50.0000 mg | ORAL_CAPSULE | Freq: Three times a day (TID) | ORAL | Status: DC

## 2015-05-19 NOTE — Assessment & Plan Note (Signed)
Post viral, most likely. Will provide Mucinex to help with production. Exam normal

## 2015-05-19 NOTE — Patient Instructions (Signed)
Thank you for coming to see me today. It was a pleasure. Today we talked about:   Back pain: I have refilled your Norco and Lyrica. Please let me know the minimal amount of medication you need to control your pain  Cough: I will give you Mucinex to help  Hypertension: I will recheck your blood pressure before you go. We may need to adjust your medications  Please make an appointment to see me in 3 months.  If you have any questions or concerns, please do not hesitate to call the office at [phone].  Sincerely,  Cordelia Poche, MD

## 2015-05-19 NOTE — Assessment & Plan Note (Signed)
Not controlled. Follow-up with nurse visit. If elevated, will need to adjust medication regimen. Will need to add new medication.

## 2015-05-19 NOTE — Assessment & Plan Note (Signed)
Controlled. Refill Norco for one month. Will adjust regimen as patient has been taking this scheduled. Advised to take this strictly PRN.

## 2015-05-19 NOTE — Progress Notes (Signed)
    Subjective    Tyler Lester is a 70 y.o. male that presents for a follow-up visit for:   1. 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 Norco and Lyrica are helping with his pain. No falls. No urine incontinence/retention and no bowel incontinence. No saddle anesthesia.  2. Cough: Symptoms started one week ago. Cough is productive. He had associated sneezing and rhinorrhea. He reports some dyspnea, however, this is improved when he cough's up phlegm. No fevers. No sick contacts.  3. Hypertension: Patient is adherent with lisinopril 40mg  and hydrochlorothiazide. He reports upper right sided chest pain secondary to coughing.   4. Healthcare maintenance: patient states he has had a recent colonoscopy in the past. Unsure of which office he went to get this colonoscopy. Per chart review, appears his last PCP documented a colonoscopy in 2012 with evidence of polyps and 3-5 year follow-up. Patient confirms that his last colonoscopy was significant for polyps.  Social History  Substance Use Topics  . Smoking status: Never Smoker   . Smokeless tobacco: Never Used  . Alcohol Use: Yes     Comment: rare alcohol /beer    Allergies  Allergen Reactions  . Poison Ivy Treatments Hives    No orders of the defined types were placed in this encounter.    ROS  Per HPI   Objective   BP 150/82 mmHg  Pulse 80  Temp(Src) 97.4 F (36.3 C) (Oral)  Ht 5\' 9"  (1.753 m)  Wt 203 lb (92.08 kg)  BMI 29.96 kg/m2  SpO2 98%  Vital signs reviewed  General: Well appearing, no distress HEENT:   Head:  Normocephalic  Eyes: Pupils equal and reactive to light/accomodation. Extraocular movements intact bilaterally.  Ears: Tympanic membranes normal bilaterally.  Nose/Throat: Nares patent bilaterally. Oropharnx clear and moist.  Neck: No cervical adenopathy bilaterally Respiratory: Clear to auscultation bilaterally. Unlabored work of breathing. No wheezing or rales.   Assessment and Plan     Back pain with sciatica Controlled. Refill Norco for one month. Will adjust regimen as patient has been taking this scheduled. Advised to take this strictly PRN.  Cough Post viral, most likely. Will provide Mucinex to help with production. Exam normal  Essential hypertension Not controlled. Follow-up with nurse visit. If elevated, will need to adjust medication regimen. Will need to add new medication.   Colon cancer screening - Ambulatory referral to Gastroenterology

## 2015-05-23 ENCOUNTER — Ambulatory Visit: Payer: Medicare Other | Admitting: Neurology

## 2015-05-23 ENCOUNTER — Encounter (HOSPITAL_BASED_OUTPATIENT_CLINIC_OR_DEPARTMENT_OTHER): Payer: Medicare Other

## 2015-05-24 ENCOUNTER — Encounter: Payer: Self-pay | Admitting: Neurology

## 2015-05-24 ENCOUNTER — Telehealth: Payer: Self-pay | Admitting: Family Medicine

## 2015-05-24 DIAGNOSIS — G20 Parkinson's disease: Secondary | ICD-10-CM

## 2015-05-24 NOTE — Telephone Encounter (Signed)
Pt called and would like another referral to a different Neurologist office. He is not happy where he is at and don not think that they are helping him at all. Jw

## 2015-05-25 NOTE — Telephone Encounter (Signed)
Pt is calling back to see if he can get a referral to a Neurologist office different than the one he is going to now. Please let him know since he wants to see what is going on. jw

## 2015-05-26 NOTE — Telephone Encounter (Signed)
I cannot do anything without a new referral. Will wait until Dr. Lonny Prude responds.

## 2015-05-26 NOTE — Telephone Encounter (Signed)
Referral placed.

## 2015-05-30 ENCOUNTER — Ambulatory Visit: Payer: Medicare Other | Admitting: *Deleted

## 2015-05-30 ENCOUNTER — Other Ambulatory Visit: Payer: Self-pay | Admitting: *Deleted

## 2015-05-30 DIAGNOSIS — E1149 Type 2 diabetes mellitus with other diabetic neurological complication: Secondary | ICD-10-CM

## 2015-05-30 DIAGNOSIS — R2681 Unsteadiness on feet: Secondary | ICD-10-CM

## 2015-05-30 MED ORDER — LISINOPRIL 40 MG PO TABS: 40.0000 mg | ORAL_TABLET | Freq: Every day | ORAL | Status: DC

## 2015-05-30 NOTE — Progress Notes (Signed)
Patient ID: Tyler Lester, male   DOB: 02-04-1946, 70 y.o.   MRN: [id number] Patient presents to be casted for a brace with [facility] Certified Pedorthist. Patient also presents to be scanned and measured for diabetic shoes and inserts.   Patient will return in 4 weeks to be fitted.

## 2015-06-03 ENCOUNTER — Encounter: Payer: Self-pay | Admitting: Neurology

## 2015-06-03 ENCOUNTER — Ambulatory Visit (INDEPENDENT_AMBULATORY_CARE_PROVIDER_SITE_OTHER): Payer: Medicare Other | Admitting: Neurology

## 2015-06-03 VITALS — BP 142/86 | HR 83 | Ht 69.0 in | Wt 204.0 lb

## 2015-06-03 DIAGNOSIS — R413 Other amnesia: Secondary | ICD-10-CM | POA: Diagnosis not present

## 2015-06-03 DIAGNOSIS — G894 Chronic pain syndrome: Secondary | ICD-10-CM | POA: Diagnosis not present

## 2015-06-03 DIAGNOSIS — G20 Parkinson's disease: Secondary | ICD-10-CM

## 2015-06-03 NOTE — Patient Instructions (Signed)
On/Off test scheduled on 06/23/15 at 2:30 pm. Stay off Carbidopa Levodopa 24 hours prior to appt.

## 2015-06-03 NOTE — Progress Notes (Signed)
Tyler Lester was seen today in the movement disorders clinic for neurologic consultation at the request of Cordelia Poche, MD.  The patient presents today for the evaluation of Parkinson's disease.  He is accompanied by a "friend" who supplements the history   I had the opportunity to review records from his previous provider at [facility] neurology, Dr. Jaynee Eagles.  He started seeing Dr. Jaynee Eagles in December, 2015 and last saw her in December, 2016.  He was diagnosed with Parkinson's disease in December, 2015.  She ordered an MRI of the brain, but the patient did not complete that.  He was started on Azilect in December, 2015.  In August, 2016 he was started on Neupro.  He followed back up in September, 2016 and said that the Neupro did not help and it caused sexual urges in the Neupro was discontinued.  He was then started on levodopa.  He followed back up on 11/16/2014.  Noncompliance was suspected for a long time and the pharmacy was contacted.  His Azilect had not been filled since July and his Sinemet was being filled quite erratically.    Pt states that he started with tremor about 2 years ago with L hand and L jaw.  There is some tremor in the L jaw.  He reports that he is still taking the carbidopa/levodopa 25/100 tid.  Bottle he brings in is full and was filled on 04/20/15.  He states that he felt it more recently and just poured the pills into this bottle.  We called the pharmacy, however, and this was not the case.  He states that he takes it at 8am and 2 tablets at 10 pm.    The patient also has a history of chronic pain syndrome.  He was being treated by orthopedics, but after his orthopedic physician found out that he was not the only prescriber for his opioid, he would no longer prescribe him the medication.  His primary care physician was prescribing him his pain medication until last year when the patient tested positive for alcohol in the urine and negative for opioids in the urine.   Specific  Symptoms:  Tremor: Yes.   Family hx of similar:  No. Voice: unknown Sleep: awakenings during the night  Vivid Dreams:  Yes.    Acting out dreams:  No. Wet Pillows: Yes.   Postural symptoms:  Yes.    Falls?  Yes.   (not since last year) Bradykinesia symptoms: difficulty getting out of a chair Loss of smell:  Yes.   Loss of taste:  No. Urinary Incontinence:  No. Difficulty Swallowing:  No. Handwriting, micrographia: Yes.   Trouble with ADL's:  No.  Trouble buttoning clothing: Yes.   Depression:  No. but easily irritated Memory changes:  Yes.   Hallucinations:  No.  visual distortions: Yes.   and has illusions N/V:  No. Lightheaded:  Yes.   but rare  Syncope: No. Diplopia:  No. Dyskinesia:  No.    ALLERGIES:   Allergies  Allergen Reactions  . Poison Ivy Treatments Hives    CURRENT MEDICATIONS:  Outpatient Encounter Prescriptions as of 06/03/2015  Medication Sig  . atorvastatin (LIPITOR) 20 MG tablet Take 1 tablet (20 mg total) by mouth daily.  . carbidopa-levodopa (SINEMET) 25-100 MG tablet Take 1 tablet by mouth 3 (three) times daily. Take 4 hours apart at 8am, noon and 4pm for example. Do not eat at the same time as protein.  . Cholecalciferol (VITAMIN D3) 400 UNITS  tablet Take 2 tablets (800 Units total) by mouth daily.  Marland Kitchen docusate sodium (COLACE) 100 MG capsule Take 1 capsule (100 mg total) by mouth 2 (two) times daily.  . finasteride (PROSCAR) 5 MG tablet Take 5 mg by mouth daily. Reported on 02/15/2015  . hydrochlorothiazide (HYDRODIURIL) 25 MG tablet Take 25 mg by mouth daily. Reported on 02/15/2015  . HYDROcodone-acetaminophen (NORCO) 7.5-325 MG tablet Take 1 tablet by mouth every 8 (eight) hours as needed for moderate pain.  Marland Kitchen lisinopril (PRINIVIL,ZESTRIL) 40 MG tablet Take 1 tablet (40 mg total) by mouth daily.  Marland Kitchen loratadine (CLARITIN) 10 MG tablet Take 10 mg by mouth daily.  . pregabalin (LYRICA) 50 MG capsule Take 1 capsule (50 mg total) by mouth 3 (three) times  daily.  . rasagiline (AZILECT) 1 MG TABS tablet Take 1 tablet (1 mg total) by mouth daily.  Marland Kitchen senna (SENOKOT) 8.6 MG TABS tablet Take 1 tablet (8.6 mg total) by mouth 2 (two) times daily.  Marland Kitchen SSD 1 % cream APPLY TOPICALLY DAILY  . tamsulosin (FLOMAX) 0.4 MG CAPS capsule Take 1 capsule (0.4 mg total) by mouth daily after supper.  . venlafaxine XR (EFFEXOR-XR) 37.5 MG 24 hr capsule take 1 capsule by mouth once daily  . [DISCONTINUED] Blood Glucose Monitoring Suppl (ONE TOUCH ULTRA MINI) W/DEVICE KIT   . [DISCONTINUED] fluorometholone (FML) 0.1 % ophthalmic suspension   . [DISCONTINUED] guaiFENesin 200 MG tablet Take 1 tablet (200 mg total) by mouth every 6 (six) hours as needed for cough or to loosen phlegm.  . [DISCONTINUED] ipratropium (ATROVENT) 0.06 % nasal spray Place 2 sprays into both nostrils 4 (four) times daily.  . [DISCONTINUED] ONE TOUCH ULTRA TEST test strip   . [DISCONTINUED] ONETOUCH DELICA LANCETS FINE MISC    No facility-administered encounter medications on file as of 06/03/2015.    PAST MEDICAL HISTORY:   Past Medical History  Diagnosis Date  . Diabetes mellitus   . Chronic pain   . Hypertension   . Hyperlipidemia   . Sleep apnea   . Shortness of breath   . Inguinal hernia     right  . Pneumonia   . Numbness     T/O  . Bilateral foot pain   . Parkinson's disease ([place])     PAST SURGICAL HISTORY:   Past Surgical History  Procedure Laterality Date  . Colon surgery    . Cataract extraction w/ intraocular lens implant Left 06/2012    laser for posterior capsule?  Marland Kitchen Tonsillectomy    . Posterior cervical fusion/foraminotomy N/A 11/12/2013    Procedure: Posterior cervical decompression fusion, cervical 3-4, cervical 4-5, cervical 5-6, cervical 6-7 with instrumentation and allograft   (LEVEL 4);  Surgeon: Sinclair Ship, MD;  Location: [place];  Service: Orthopedics;  Laterality: N/A;  Posterior cervical decompression fusion, cervical 3-4, cervical 4-5, cervical 5-6,  cervical 6-7 with instrumentation and allograft    SOCIAL HISTORY:   Social History   Social History  . Marital Status: Single    Spouse Name: N/A  . Number of Children: 2  . Years of Education: 12   Occupational History  . Retired    . Disability    Social History Main Topics  . Smoking status: Never Smoker   . Smokeless tobacco: Never Used  . Alcohol Use: 0.0 oz/week    0 Standard drinks or equivalent per week     Comment: 1 drink a month  . Drug Use: No  . Sexual Activity:  Not on file   Other Topics Concern  . Not on file   Social History Narrative   Patient lives at home alone.    Patient have 2 children.    Patient has a high school education    Patient is right handed.    Patient is retired on disability.        FAMILY HISTORY:   Family Status  Relation Status Death Age  . Mother Deceased     unknown  . Father Deceased     unknown  . Maternal Grandmother Deceased   . Maternal Grandfather Deceased     ROS:  A complete 10 system review of systems was obtained and was unremarkable apart from what is mentioned above.  PHYSICAL EXAMINATION:    VITALS:   Filed Vitals:   06/03/15 1413  BP: 142/86  Pulse: 83  Height: 5' 9"  (1.753 m)  Weight: 204 lb (92.534 kg)    GEN:  The patient appears stated age and is in NAD. HEENT:  Normocephalic, atraumatic.  The mucous membranes are moist. The superficial temporal arteries are without ropiness or tenderness. CV:  RRR Lungs:  CTAB Neck/HEME:  There are no carotid bruits bilaterally.  Neurological examination:  Orientation:  Montreal Cognitive Assessment  06/03/2015  Visuospatial/ Executive (0/5) 3  Naming (0/3) 3  Attention: Read list of digits (0/2) 1  Attention: Read list of letters (0/1) 1  Attention: Serial 7 subtraction starting at 100 (0/3) 0  Language: Repeat phrase (0/2) 2  Language : Fluency (0/1) 0  Abstraction (0/2) 0  Delayed Recall (0/5) 0  Orientation (0/6) 6  Total 16  Adjusted Score  (based on education) 17    Cranial nerves: There is good facial symmetry. Pupils are equal round and reactive to light bilaterally. Fundoscopic exam reveals clear margins bilaterally. Extraocular muscles are intact. The visual fields are full to confrontational testing. The speech is fluent and clear. Soft palate rises symmetrically and there is no tongue deviation. Hearing is intact to conversational tone. Sensation: Sensation is intact to light and pinprick throughout (facial, trunk, extremities). Vibration is decreased at the bilateral big toe. There is no extinction with double simultaneous stimulation. There is no sensory dermatomal level identified. Motor: Strength is 5/5 in the bilateral upper and lower extremities.   Shoulder shrug is equal and symmetric.  There is no pronator drift. Deep tendon reflexes: Deep tendon reflexes are 2/4 at the bilateral biceps, triceps, brachioradialis, patella and achilles. Plantar responses are downgoing bilaterally.  Movement examination: Tone: There is moderate increased tone in the LUE/LLE.  There is good tone in the RUE/RLE.   Abnormal movements: There is a mild RUE resting tremor. Coordination:  There is decremation with RAM's, with any form of RAMS, including alternating supination and pronation of the forearm, hand opening and closing, finger taps, heel taps and toe taps on the R.   Gait and Station: The patient has mild difficulty arising out of a deep-seated chair without the use of the hands; he is able to do it on the 2nd attempt. The patient's stride length is decreased and he limps as he has a short leg on the right.  The patient has a positive pull test.      ASSESSMENT/PLAN:  1.  Parkinsonism.  I suspect that this does represent idiopathic Parkinson's disease.  The patient has tremor, bradykinesia, rigidity and mild postural instability.  -We discussed the diagnosis as well as pathophysiology of the disease.  We discussed treatment  options as  well as prognostic indicators.  Patient education was provided.  -Greater than 50% of the 60 minute visit was spent in counseling answering questions and talking about what to expect now as well as in the future.  We talked about medication options as well as potential future surgical options.  We talked about safety in the home.  -Long discussion with the patient today.  I discussed with him the importance of compliance with medication, as well as with follow-up visits.  I told him quite frankly that the reason that he thinks that the medication does not work is because he does not take it.  I confronted him with his pharmacy refills and showed him that he does not refill it very often.  We talked about how to take this medication properly.  We talked about the fact that he should keep it away from protein.  I ultimately decided to do an on/off test on him so that we could prove/disprove if this medication works, which I suspect that it does.  -I talked to the patient about safe, cardiovascular exercise.  -We discussed community resources in the area including patient support groups and community exercise programs for PD and pt education was provided to the patient. 2.  Memory change.  -His MoCA was not good, but I suspect that this is not all memory loss and suspect that some of this is poor education level.  He may need neuropsych testing in the future to help separate this out. 3.  Chronic pain syndrome  -The patient is aware that our office does not treat chronic pain and that we are a non-opioid prescribing practice.  He was aware of this prior to making this appointment today.  Unfortunately, he has violated the terms of others contracts in regards to his pain, so his orthopedic physician and his primary care physician will no longer treat his pain either. 4.  I will see him back when we do his on/off testing.

## 2015-06-14 ENCOUNTER — Other Ambulatory Visit: Payer: Self-pay | Admitting: Family Medicine

## 2015-06-14 DIAGNOSIS — G8929 Other chronic pain: Secondary | ICD-10-CM

## 2015-06-14 NOTE — Telephone Encounter (Signed)
Call when rx for hydrocodone is ready for pickup

## 2015-06-15 MED ORDER — HYDROCODONE-ACETAMINOPHEN 7.5-325 MG PO TABS: 1.0000 | ORAL_TABLET | Freq: Three times a day (TID) | ORAL | Status: DC | PRN

## 2015-06-15 NOTE — Telephone Encounter (Signed)
LVM to call office to inform of below. Tyler Lester, [name], [name]

## 2015-06-15 NOTE — Telephone Encounter (Signed)
Patient calling again about rx

## 2015-06-15 NOTE — Telephone Encounter (Signed)
Prescription written. Available for pick up.

## 2015-06-16 NOTE — Telephone Encounter (Signed)
LM to call office back to inform him of below. Katharina Caper, [name], [name]

## 2015-06-20 NOTE — Telephone Encounter (Signed)
Rx was picked up by pt. Tyler Lester, [name], [name]

## 2015-06-22 ENCOUNTER — Encounter: Payer: Self-pay | Admitting: Family Medicine

## 2015-06-22 ENCOUNTER — Ambulatory Visit (INDEPENDENT_AMBULATORY_CARE_PROVIDER_SITE_OTHER): Payer: Medicare Other | Admitting: Family Medicine

## 2015-06-22 VITALS — BP 167/89 | HR 79 | Temp 98.0°F | Ht 69.0 in | Wt 208.7 lb

## 2015-06-22 DIAGNOSIS — R0600 Dyspnea, unspecified: Secondary | ICD-10-CM | POA: Diagnosis not present

## 2015-06-22 DIAGNOSIS — I5032 Chronic diastolic (congestive) heart failure: Secondary | ICD-10-CM

## 2015-06-22 LAB — COMPLETE METABOLIC PANEL WITH GFR
ALT: 7 U/L — ABNORMAL LOW (ref 9–46)
AST: 11 U/L (ref 10–35)
Albumin: 3.6 g/dL (ref 3.6–5.1)
Alkaline Phosphatase: 52 U/L (ref 40–115)
BUN: 15 mg/dL (ref 7–25)
CO2: 26 mmol/L (ref 20–31)
Calcium: 9.1 mg/dL (ref 8.6–10.3)
Chloride: 105 mmol/L (ref 98–110)
Creat: 1.33 mg/dL — ABNORMAL HIGH (ref 0.70–1.25)
GFR, Est African American: 63 mL/min (ref >=60)
GFR, Est Non African American: 55 mL/min — ABNORMAL LOW (ref >=60)
Glucose, Bld: 95 mg/dL (ref 65–99)
Potassium: 3.5 mmol/L (ref 3.5–5.3)
Sodium: 143 mmol/L (ref 135–146)
Total Bilirubin: 0.5 mg/dL (ref 0.2–1.2)
Total Protein: 6.8 g/dL (ref 6.1–8.1)

## 2015-06-22 MED ORDER — FUROSEMIDE 40 MG PO TABS: ORAL_TABLET | ORAL | Status: DC

## 2015-06-22 NOTE — Patient Instructions (Signed)
Cut out the salt. I think you have congestive heart failure causing your body to retain salt and fluid Stop the hydrochlorothiazide Start furosemide two times a day for the first week to get the fluid off and then once a day thereafter. Come back in one week for a recheck of your weight and fluid.

## 2015-06-22 NOTE — Assessment & Plan Note (Addendum)
Lasix and bnp.  With his previous echo and his orthopnea and DOE, I was fairly confident in my clinical diagnosis of CHF.   I know see his BNP is in the normal range and believe I should broaden my differential.   He had a mild bump in creat - he likely needs a renal and bladder ultrasound to rule out hydro given his previous BPH.  Myelopathy could also give a neurogenic bladder. He has had previous negative for DVT LE dopplers remotely.  I doubt DVT since it would need to be bilateral.  No trauma.  Only risk factor is mild immobility due to parkinsons.

## 2015-06-23 ENCOUNTER — Ambulatory Visit: Payer: Medicare Other | Admitting: Neurology

## 2015-06-23 LAB — BRAIN NATRIURETIC PEPTIDE: Brain Natriuretic Peptide: 9.1 pg/mL (ref <100)

## 2015-06-23 NOTE — Progress Notes (Signed)
   Subjective:    Patient ID: Tyler Lester, male    DOB: 07/15/1945, 70 y.o.   MRN: [id number]  HPI  Patient complains of bilateral leg swelling Lt>Rt.  No change in diet.  No med changes.  No trauma to legs. Patient has underlying problem of Parkinsons Disease.    He did not have a diagnosis of CHF, He did have diastolic dysfunction grade 1 on a 2015 echo.  He complains of dyspnea on exertion and 2 pillow orthopnea.  He has no known liver or kidney disease.  Again, reviewing records, he had enlarged prostate on previous CT scan.  He denies any urinary hesitancy or difficulty voiding. States that he feels like he completely empties his bladder.      Review of Systems     Objective:   Physical Exam No recent 10 lb weight gain.   Lungs clear Cardiac RRR without m or g abd benign Ext 3+ bilateral leg edema.  Left leg slightly larger than left.  No erythema or tenderness.        Assessment & Plan:

## 2015-06-24 ENCOUNTER — Other Ambulatory Visit: Payer: Self-pay | Admitting: *Deleted

## 2015-06-27 ENCOUNTER — Ambulatory Visit: Payer: Medicare Other | Admitting: *Deleted

## 2015-06-27 DIAGNOSIS — R2681 Unsteadiness on feet: Secondary | ICD-10-CM | POA: Diagnosis not present

## 2015-06-28 DIAGNOSIS — H401132 Primary open-angle glaucoma, bilateral, moderate stage: Secondary | ICD-10-CM | POA: Diagnosis not present

## 2015-06-28 NOTE — Progress Notes (Signed)
Patient ID: Tyler Lester, male   DOB: 1945/08/29, 71 y.o.   MRN: [id number] Patient presents for fitting of [place] with [facility] Certified Pedorthist. Written and verbal break in instructions given. Patient will follow up in 6 weeks with Dr. Jacqualyn Posey

## 2015-07-05 ENCOUNTER — Telehealth: Payer: Self-pay | Admitting: Family Medicine

## 2015-07-05 NOTE — Telephone Encounter (Signed)
Form placed in PCP box for completion. Zimmerman Rumple, [name], CMA  

## 2015-07-05 NOTE — Telephone Encounter (Signed)
Patient's PCP to complete forms for [place]. Please, follow-up with patient.

## 2015-07-08 NOTE — Telephone Encounter (Signed)
Forms completed and given to RN.

## 2015-07-08 NOTE — Telephone Encounter (Signed)
Left voice message for patient that forms were faxed to number provided and original forms placed up front for pickup.  Derl Barrow, RN

## 2015-07-15 ENCOUNTER — Ambulatory Visit (INDEPENDENT_AMBULATORY_CARE_PROVIDER_SITE_OTHER): Payer: Medicare Other | Admitting: Family Medicine

## 2015-07-15 VITALS — BP 140/78 | HR 88 | Temp 98.3°F | Ht 69.0 in | Wt 204.6 lb

## 2015-07-15 DIAGNOSIS — N400 Enlarged prostate without lower urinary tract symptoms: Secondary | ICD-10-CM | POA: Diagnosis not present

## 2015-07-15 DIAGNOSIS — G8929 Other chronic pain: Secondary | ICD-10-CM

## 2015-07-15 DIAGNOSIS — R0982 Postnasal drip: Secondary | ICD-10-CM

## 2015-07-15 DIAGNOSIS — M5441 Lumbago with sciatica, right side: Secondary | ICD-10-CM

## 2015-07-15 DIAGNOSIS — I10 Essential (primary) hypertension: Secondary | ICD-10-CM | POA: Diagnosis not present

## 2015-07-15 MED ORDER — HYDROCODONE-ACETAMINOPHEN 7.5-325 MG PO TABS: 1.0000 | ORAL_TABLET | Freq: Three times a day (TID) | ORAL | Status: DC | PRN

## 2015-07-15 MED ORDER — FLUTICASONE PROPIONATE 50 MCG/ACT NA SUSP: 2.0000 | Freq: Every day | NASAL | Status: DC

## 2015-07-15 MED ORDER — GUAIFENESIN ER 600 MG PO TB12: 600.0000 mg | ORAL_TABLET | Freq: Two times a day (BID) | ORAL | Status: DC | PRN

## 2015-07-15 NOTE — Patient Instructions (Signed)
Thank you for coming to see me today. It was a pleasure. Today we talked about:   Post nasal drip: I am prescribing Flonase to help with this. You can also use Mucinex to help with the production.  Hypertension: no changes today  Chronic back pain: refill Norco  BPH: No changes today  Please make an appointment to see me in one month.  If you have any questions or concerns, please do not hesitate to call the office at [phone].  Sincerely,  Cordelia Poche, MD

## 2015-07-15 NOTE — Progress Notes (Signed)
    Subjective    Tyler Lester is a 70 y.o. male that presents for a follow-up visit for:   1. Chronic pain: Patient adherent with Norco. No complaints. Patient's function stable and improved with Norco.  2. Hypertension: He is adherent with lisinopril. No chest pain or dyspnea.  3. BPH: Patient states he is adherent with Flomax 0.4mg  daily. He has no issues with urine flow.  4. Congestion: patient states symptoms started about one year ago. He has associated post nasal drip. He reports no associated rhinorrhea or sneezing.   Social History  Substance Use Topics  . Smoking status: Never Smoker   . Smokeless tobacco: Never Used  . Alcohol Use: 0.0 oz/week    0 Standard drinks or equivalent per week     Comment: 1 shot a day    Allergies  Allergen Reactions  . Poison Ivy Treatments Hives    No orders of the defined types were placed in this encounter.    ROS  Per HPI   Objective   BP 140/78 mmHg  Pulse 88  Temp(Src) 98.3 F (36.8 C) (Oral)  Ht 5\' 9"  (1.753 m)  Wt 204 lb 9.6 oz (92.806 kg)  BMI 30.20 kg/m2  Vital signs reviewed  General: Well appearing, no distress HEENT:   Head:  Normocephalic  Eyes: Pupils equal and reactive to light/accomodation. Extraocular movements intact bilaterally.  Ears: Tympanic membranes normal bilaterally.  Nose/Throat: Nares patent bilaterally with slight mucosal edema. Oropharnx clear and moist.  Neck: No cervical adenopathy bilaterally  Assessment and Plan    Back pain with sciatica Stable. Refill Norco  Essential hypertension Stable. No changes.  Post-nasal drip - fluticasone (FLONASE) 50 MCG/ACT nasal spray; Place 2 sprays into both nostrils daily.  Dispense: 16 g; Refill: 5 - guaiFENesin (MUCINEX) 600 MG 12 hr tablet; Take 1 tablet (600 mg total) by mouth 2 (two) times daily as needed.  Dispense: 30 tablet; Refill: 0  BPH - continue flomax

## 2015-07-18 ENCOUNTER — Encounter: Payer: Self-pay | Admitting: Family Medicine

## 2015-07-18 NOTE — Assessment & Plan Note (Signed)
Stable No changes 

## 2015-07-18 NOTE — Assessment & Plan Note (Signed)
Stable. Refill Norco

## 2015-07-21 ENCOUNTER — Ambulatory Visit: Payer: Medicare Other | Admitting: Neurology

## 2015-07-24 ENCOUNTER — Other Ambulatory Visit: Payer: Self-pay | Admitting: Family Medicine

## 2015-07-26 MED ORDER — LISINOPRIL 40 MG PO TABS
40.0000 mg | ORAL_TABLET | Freq: Every day | ORAL | Status: DC
Start: 2015-07-26 — End: 2015-10-24

## 2015-07-27 ENCOUNTER — Encounter: Payer: Self-pay | Admitting: Podiatry

## 2015-07-27 ENCOUNTER — Ambulatory Visit (INDEPENDENT_AMBULATORY_CARE_PROVIDER_SITE_OTHER): Payer: Medicare Other | Admitting: Podiatry

## 2015-07-27 DIAGNOSIS — M79676 Pain in unspecified toe(s): Secondary | ICD-10-CM | POA: Diagnosis not present

## 2015-07-27 DIAGNOSIS — E1149 Type 2 diabetes mellitus with other diabetic neurological complication: Secondary | ICD-10-CM

## 2015-07-27 DIAGNOSIS — B351 Tinea unguium: Secondary | ICD-10-CM

## 2015-07-27 NOTE — Patient Instructions (Signed)
You can purchase fungi-nail for the nail fungus.

## 2015-07-31 NOTE — Progress Notes (Signed)
Patient ID: RIGSBY PEREGRINO, male   DOB: August 15, 1945, 70 y.o.   MRN: [id number]   Subjective: 70 y.o.-year-old male returns the office today for painful, elongated, thickened toenails which he is unable to trim himself. Denies any surrounding redness or drainage. He states the brace is doing well. Denies any acute changes since last appointment and no new complaints today. Denies any systemic complaints such as fevers, chills, nausea, vomiting.   Objective: AAO 3, NAD DP/PT pulses decreased, CRT less than 3 seconds Protective sensation decreased with Simms Weinstein monofilament Nails hypertrophic, dystrophic, elongated, brittle, discolored 10. There is tenderness overlying the nails 1-5 bilaterally. There is no surrounding erythema or drainage along the nail sites. No open lesions or pre-ulcerative lesions are identified bilaterally No other areas of tenderness bilateral lower extremities. No overlying edema, erythema, increased warmth. Hammertoe and HAV bilaterally.  No pain with calf compression, swelling, warmth, erythema.  Assessment: Patient presents with symptomatic onychomycosis; no signs of infection at this time.   Plan: -Treatment options including alternatives, risks, complications were discussed -Nails sharply debrided 10 without complication/bleeding. -Discussed daily foot inspection. If there are any changes, to call the office immediately.  -Follow-up in 3 months or sooner if any problems are to arise. In the meantime, encouraged to call the office with any questions, concerns, changes symptoms.   Celesta Gentile, DPM

## 2015-08-08 ENCOUNTER — Ambulatory Visit: Payer: Medicare Other | Admitting: Podiatry

## 2015-08-11 ENCOUNTER — Other Ambulatory Visit: Payer: Self-pay | Admitting: *Deleted

## 2015-08-11 ENCOUNTER — Other Ambulatory Visit: Payer: Self-pay | Admitting: Family Medicine

## 2015-08-11 DIAGNOSIS — G8929 Other chronic pain: Secondary | ICD-10-CM

## 2015-08-11 NOTE — Telephone Encounter (Signed)
Tyler Lester at 08/11/2015 11:52 AM     Status: Signed        Refill request for hydrocodone. Pt needed appt before 6/20 to get his refill, but Dr. Lisbeth Ply next available appt is 6/28. He is scheduled on that day.

## 2015-08-11 NOTE — Telephone Encounter (Signed)
Refill request for hydrocodone. Pt needed appt before 6/20 to get his refill, but Dr. Lisbeth Ply next available appt is 6/28. He is scheduled on that day.

## 2015-08-12 ENCOUNTER — Telehealth: Payer: Self-pay | Admitting: *Deleted

## 2015-08-12 MED ORDER — HYDROCODONE-ACETAMINOPHEN 7.5-325 MG PO TABS: 1.0000 | ORAL_TABLET | Freq: Three times a day (TID) | ORAL | Status: DC | PRN

## 2015-08-12 NOTE — Telephone Encounter (Signed)
LVm for pt to call back to inform of below.Tyler Lester, [name], [name]

## 2015-08-12 NOTE — Telephone Encounter (Signed)
Refill request for Silvadene Cream ordered 01/2015.  I asked pt what he was using the Silvadene for and he said he felt he had a infected toe.  I told him, it would be best for him to see a doctor and transferred him to the scheduler. Pt called back and I told him to make an appt, to do epsom salt soaks daily and cover with a neosporin bandaid, until the appt.  I transferred him to the schedulers again.

## 2015-08-12 NOTE — Telephone Encounter (Signed)
Rx already filled.  Tyler Lester. Jerline Pain, [place] Medicine Resident PGY-2 08/12/2015 4:02 PM

## 2015-08-12 NOTE — Telephone Encounter (Signed)
Rx filled and left at front of office.  Algis Greenhouse. Jerline Pain, [place] [facility] Resident PGY-2 08/12/2015 9:16 AM

## 2015-08-19 ENCOUNTER — Ambulatory Visit: Payer: Medicare Other | Admitting: Podiatry

## 2015-08-23 ENCOUNTER — Ambulatory Visit: Payer: Medicare Other | Admitting: Neurology

## 2015-08-24 ENCOUNTER — Ambulatory Visit (INDEPENDENT_AMBULATORY_CARE_PROVIDER_SITE_OTHER): Payer: Medicare Other | Admitting: Family Medicine

## 2015-08-24 ENCOUNTER — Encounter: Payer: Self-pay | Admitting: Family Medicine

## 2015-08-24 VITALS — BP 150/75 | HR 70 | Temp 97.9°F | Ht 69.0 in | Wt 208.2 lb

## 2015-08-24 DIAGNOSIS — I10 Essential (primary) hypertension: Secondary | ICD-10-CM

## 2015-08-24 DIAGNOSIS — E1149 Type 2 diabetes mellitus with other diabetic neurological complication: Secondary | ICD-10-CM

## 2015-08-24 DIAGNOSIS — J309 Allergic rhinitis, unspecified: Secondary | ICD-10-CM | POA: Diagnosis not present

## 2015-08-24 DIAGNOSIS — G8929 Other chronic pain: Secondary | ICD-10-CM | POA: Diagnosis not present

## 2015-08-24 LAB — POCT GLYCOSYLATED HEMOGLOBIN (HGB A1C): Hemoglobin A1C: 6.5

## 2015-08-24 MED ORDER — LORATADINE 10 MG PO TABS: 10.0000 mg | ORAL_TABLET | Freq: Every day | ORAL | Status: DC

## 2015-08-24 NOTE — Patient Instructions (Signed)
Thank you for coming to see me today. It was a pleasure. Today we talked about:   Diabetes: Her A1c is stable at 6.5. I will hold off on restarting medications at this time. Please keep your weight controlled as possible. Next  Hypertension: Blood pressures were elevated today. Please make sure you take your blood pressure medication before he comes to see me so we can get good, accurate readings. I will make no changes to your medication today.  Allergic rhinitis: I will start Sean Claritin. Please continue Flonase daily as well. This should help with your itchy eyes, runny nose, sneezing and coughing.  Please make an appointment to see your new doctor in 1 month for your chronic pain.  If you have any questions or concerns, please do not hesitate to call the office at [phone].  Sincerely,  Cordelia Poche, MD

## 2015-08-24 NOTE — Progress Notes (Signed)
    Subjective    Tyler Lester is a 70 y.o. male that presents for a follow-up visit for:   1. Chronic back pain: Patient is adherent with Norco. He reports that it helps control his symptoms but that he wants to "upgrade." Lyrica does not help with his pain much but says it "calms his nerves."   2. Hypertension: Patient is adherent with lisinopril 40mg  daily. He reports no chest pain or dyspnea.   3. Allergies: This is a chronic issue. He has been using Flonase. He has Claritin prescribed but has not been using it.   4. Diabetes: Patient is currently not on treatment because of previous very good control. Patient reports no polyuria, polydipsia or polyphagia. He is no checking blood sugars.  Social History  Substance Use Topics  . Smoking status: Never Smoker   . Smokeless tobacco: Never Used  . Alcohol Use: 0.0 oz/week    0 Standard drinks or equivalent per week     Comment: 1 shot a day    Allergies  Allergen Reactions  . Poison Ivy Treatments Hives    No orders of the defined types were placed in this encounter.    ROS  Per HPI   Objective   BP 150/75 mmHg  Pulse 70  Temp(Src) 97.9 F (36.6 C) (Oral)  Ht 5\' 9"  (1.753 m)  Wt 208 lb 3.2 oz (94.439 kg)  BMI 30.73 kg/m2  SpO2 98%  Vital signs reviewed  General: Well appearing, no distress HEENT: Eyes: injected bilaterally Respiratory/Chest: Clear to auscultation bilaterally. Unlabored work of breathing. No wheezing or rales. Cardiovascular: Regular rate and rhythm. Normal S1 and S2. No heart murmurs present. No extra heart sounds  Assessment and Plan    Rhinitis, allergic Will start claritin and advised to use Flonase.  Diabetes mellitus type 2 with neurological manifestations A1C stable at 6.5. Will hold off on starting treatment for now. Advised to manage via diet. Discussed that if A1C does not improve, that he may need to restart treatment.  Essential hypertension Uncontrolled today. Patient has not  been compliant. Discussed compliance; especially for when coming into the clinic. Follow-up at next visit.

## 2015-08-25 ENCOUNTER — Telehealth: Payer: Self-pay | Admitting: Neurology

## 2015-08-25 DIAGNOSIS — J309 Allergic rhinitis, unspecified: Secondary | ICD-10-CM | POA: Insufficient documentation

## 2015-08-25 NOTE — Telephone Encounter (Signed)
-----   Message from [place], DO sent at 08/25/2015 10:44 AM EDT ----- Can you call pharmacy and check on RF hx of levodopa

## 2015-08-25 NOTE — Assessment & Plan Note (Signed)
Uncontrolled today. Patient has not been compliant. Discussed compliance; especially for when coming into the clinic. Follow-up at next visit.

## 2015-08-25 NOTE — Assessment & Plan Note (Signed)
A1C stable at 6.5. Will hold off on starting treatment for now. Advised to manage via diet. Discussed that if A1C does not improve, that he may need to restart treatment.

## 2015-08-25 NOTE — Telephone Encounter (Signed)
Refilled for 30 day supplies on: 08/15/15, 06/06/15, 04/20/15. She states about every two months he will pick this up.

## 2015-08-25 NOTE — Assessment & Plan Note (Signed)
Will start claritin and advised to use Flonase.

## 2015-08-26 ENCOUNTER — Encounter: Payer: Self-pay | Admitting: Neurology

## 2015-08-26 ENCOUNTER — Ambulatory Visit (INDEPENDENT_AMBULATORY_CARE_PROVIDER_SITE_OTHER): Payer: Medicare Other | Admitting: Neurology

## 2015-08-26 VITALS — BP 160/88 | HR 86 | Ht 69.0 in | Wt 213.0 lb

## 2015-08-26 DIAGNOSIS — G20 Parkinson's disease: Secondary | ICD-10-CM | POA: Diagnosis not present

## 2015-08-26 DIAGNOSIS — Z9119 Patient's noncompliance with other medical treatment and regimen: Secondary | ICD-10-CM | POA: Diagnosis not present

## 2015-08-26 DIAGNOSIS — Z91199 Patient's noncompliance with other medical treatment and regimen due to unspecified reason: Secondary | ICD-10-CM

## 2015-08-26 DIAGNOSIS — G20A1 Parkinson's disease without dyskinesia, without mention of fluctuations: Secondary | ICD-10-CM

## 2015-08-26 DIAGNOSIS — M7989 Other specified soft tissue disorders: Secondary | ICD-10-CM

## 2015-08-26 HISTORY — DX: Parkinson's disease: G20

## 2015-08-26 HISTORY — DX: Parkinson's disease without dyskinesia, without mention of fluctuations: G20.A1

## 2015-08-26 MED ORDER — CARBIDOPA-LEVODOPA 25-100 MG PO TABS
3.0000 | ORAL_TABLET | Freq: Once | ORAL | Status: AC
  Administered 2015-08-26: 3 via ORAL

## 2015-08-26 MED ORDER — CARBIDOPA-LEVODOPA 25-100 MG PO TABS: 2.0000 | ORAL_TABLET | Freq: Three times a day (TID) | ORAL | Status: DC

## 2015-08-26 NOTE — Progress Notes (Signed)
Tyler Lester was seen today in the movement disorders clinic for neurologic consultation at the request of Cordelia Poche, MD.  The patient presents today for the evaluation of Parkinson's disease.  He is accompanied by a "friend" who supplements the history   I had the opportunity to review records from his previous provider at [facility] neurology, Dr. Jaynee Eagles.  He started seeing Dr. Jaynee Eagles in December, 2015 and last saw her in December, 2016.  He was diagnosed with Parkinson's disease in December, 2015.  She ordered an MRI of the brain, but the patient did not complete that.  He was started on Azilect in December, 2015.  In August, 2016 he was started on Neupro.  He followed back up in September, 2016 and said that the Neupro did not help and it caused sexual urges in the Neupro was discontinued.  He was then started on levodopa.  He followed back up on 11/16/2014.  Noncompliance was suspected for a long time and the pharmacy was contacted.  His Azilect had not been filled since July and his Sinemet was being filled quite erratically.    Pt states that he started with tremor about 2 years ago with L hand and L jaw.  There is some tremor in the L jaw.  He reports that he is still taking the carbidopa/levodopa 25/100 tid.  Bottle he brings in is full and was filled on 04/20/15.  He states that he felt it more recently and just poured the pills into this bottle.  We called the pharmacy, however, and this was not the case.  He states that he takes it at 8am and 2 tablets at 10 pm.    The patient also has a history of chronic pain syndrome.  He was being treated by orthopedics, but after his orthopedic physician found out that he was not the only prescriber for his opioid, he would no longer prescribe him the medication.  His primary care physician was prescribing him his pain medication until last year when the patient tested positive for alcohol in the urine and negative for opioids in the urine.  08/26/15  update:  Pt here for follow up.  Last visit, we discussed importance of compliance with levodopa as pt states that the medication doesn't work.  Called pharmacy and pt has been picking up levodopa every other month (30 day supply lasting 60 days).  Missed last appt for on/off testing and r/s to today but states that he took his levodopa so cannot do the test.  L arm swollen and saw PCP 2 days ago for it and told to soak it in epsom salt.  No pain in the arm.      ALLERGIES:   Allergies  Allergen Reactions  . Poison Ivy Treatments Hives    CURRENT MEDICATIONS:  Outpatient Encounter Prescriptions as of 08/26/2015  Medication Sig  . atorvastatin (LIPITOR) 20 MG tablet Take 1 tablet (20 mg total) by mouth daily.  . carbidopa-levodopa (SINEMET) 25-100 MG tablet Take 1 tablet by mouth 3 (three) times daily. Take 4 hours apart at 8am, noon and 4pm for example. Do not eat at the same time as protein.  . Cholecalciferol (VITAMIN D3) 400 UNITS tablet Take 2 tablets (800 Units total) by mouth daily.  Marland Kitchen docusate sodium (COLACE) 100 MG capsule Take 1 capsule (100 mg total) by mouth 2 (two) times daily.  . finasteride (PROSCAR) 5 MG tablet Take 5 mg by mouth daily. Reported on 02/15/2015  .  fluticasone (FLONASE) 50 MCG/ACT nasal spray Place 2 sprays into both nostrils daily.  . furosemide (LASIX) 40 MG tablet One pill twice daily for one week then one pill daily thereafter.  Marland Kitchen guaiFENesin (MUCINEX) 600 MG 12 hr tablet Take 1 tablet (600 mg total) by mouth 2 (two) times daily as needed.  Marland Kitchen HYDROcodone-acetaminophen (NORCO) 7.5-325 MG tablet Take 1 tablet by mouth every 8 (eight) hours as needed for moderate pain.  Marland Kitchen lisinopril (PRINIVIL,ZESTRIL) 40 MG tablet Take 1 tablet (40 mg total) by mouth daily.  Marland Kitchen loratadine (CLARITIN) 10 MG tablet Take 1 tablet (10 mg total) by mouth daily.  . pregabalin (LYRICA) 50 MG capsule Take 1 capsule (50 mg total) by mouth 3 (three) times daily.  Marland Kitchen senna (SENOKOT) 8.6 MG  TABS tablet Take 1 tablet (8.6 mg total) by mouth 2 (two) times daily.  Marland Kitchen SSD 1 % cream APPLY TOPICALLY DAILY  . tamsulosin (FLOMAX) 0.4 MG CAPS capsule Take 1 capsule (0.4 mg total) by mouth daily after supper.  . venlafaxine XR (EFFEXOR-XR) 37.5 MG 24 hr capsule take 1 capsule by mouth once daily   No facility-administered encounter medications on file as of 08/26/2015.    PAST MEDICAL HISTORY:   Past Medical History  Diagnosis Date  . Diabetes mellitus   . Chronic pain   . Hypertension   . Hyperlipidemia   . Sleep apnea   . Shortness of breath   . Inguinal hernia     right  . Pneumonia   . Numbness     T/O  . Bilateral foot pain   . Parkinson's disease ([place])     PAST SURGICAL HISTORY:   Past Surgical History  Procedure Laterality Date  . Colon surgery    . Cataract extraction w/ intraocular lens implant Left 06/2012    laser for posterior capsule?  Marland Kitchen Tonsillectomy    . Posterior cervical fusion/foraminotomy N/A 11/12/2013    Procedure: Posterior cervical decompression fusion, cervical 3-4, cervical 4-5, cervical 5-6, cervical 6-7 with instrumentation and allograft   (LEVEL 4);  Surgeon: Sinclair Ship, MD;  Location: [place];  Service: Orthopedics;  Laterality: N/A;  Posterior cervical decompression fusion, cervical 3-4, cervical 4-5, cervical 5-6, cervical 6-7 with instrumentation and allograft    SOCIAL HISTORY:   Social History   Social History  . Marital Status: Single    Spouse Name: N/A  . Number of Children: 2  . Years of Education: 12   Occupational History  . Retired      Event organiser at [employer] History Main Topics  . Smoking status: Never Smoker   . Smokeless tobacco: Never Used  . Alcohol Use: 0.0 oz/week    0 Standard drinks or equivalent per week     Comment: 1 shot a day  . Drug Use: No  . Sexual Activity: Not on file   Other Topics Concern  . Not on file   Social History Narrative   Patient lives at home alone.    Patient  have 2 children.    Patient has a high school education    Patient is right handed.    Patient is retired on disability.        FAMILY HISTORY:   Family Status  Relation Status Death Age  . Mother Deceased     unknown  . Father Deceased     unknown  . Maternal Grandmother Deceased   . Maternal Grandfather Deceased   . Brother  Alive     5, healthy  . Brother Deceased     2, CA (? type)  . Sister Alive     4, healthy  . Sister Deceased     1  . Child Alive     2, healthy    ROS:  A complete 10 system review of systems was obtained and was unremarkable apart from what is mentioned above.  PHYSICAL EXAMINATION:    VITALS:   Filed Vitals:   08/26/15 1446  BP: 160/88  Pulse: 86  Height: 5\' 9"  (1.753 m)  Weight: 213 lb (96.616 kg)    GEN:  The patient appears stated age and is in NAD. HEENT:  Normocephalic, atraumatic.  The mucous membranes are moist. The superficial temporal arteries are without ropiness or tenderness. CV:  RRR Lungs:  CTAB Neck/HEME:  There are no carotid bruits bilaterally. Musculoskeletal: The fingers on the left hand are very swollen.  While it appears that the left arm is larger than the right, measuring it at the mid forearm, the right arm was 290 mm and the left was 285 mm.  Neurological examination:  Orientation:  Montreal Cognitive Assessment  06/03/2015  Visuospatial/ Executive (0/5) 3  Naming (0/3) 3  Attention: Read list of digits (0/2) 1  Attention: Read list of letters (0/1) 1  Attention: Serial 7 subtraction starting at 100 (0/3) 0  Language: Repeat phrase (0/2) 2  Language : Fluency (0/1) 0  Abstraction (0/2) 0  Delayed Recall (0/5) 0  Orientation (0/6) 6  Total 16  Adjusted Score (based on education) 17    Cranial nerves: There is good facial symmetry. Pupils are equal round and reactive to light bilaterally. Fundoscopic exam reveals clear margins bilaterally. Extraocular muscles are intact. The visual fields are full to  confrontational testing. The speech is fluent and clear. Soft palate rises symmetrically and there is no tongue deviation. Hearing is intact to conversational tone. Sensation: Sensation is intact to light and pinprick throughout (facial, trunk, extremities). Vibration is decreased at the bilateral big toe. There is no extinction with double simultaneous stimulation. There is no sensory dermatomal level identified. Motor: Strength is 5/5 in the bilateral upper and lower extremities.   Shoulder shrug is equal and symmetric.  There is no pronator drift. Deep tendon reflexes: Deep tendon reflexes are 2/4 at the bilateral biceps, triceps, brachioradialis, patella and achilles. Plantar responses are downgoing bilaterally.   Movement examination PRIOR TO ADMINISTRATION OF LEVODOPA: Tone: There is moderate increased tone in the LUE/LLE.  There is mod increased tone in the RUE today Abnormal movements: There is an independent RUE/LUE resting tremor, L more than right today Coordination:  There is decremation with RAM's, with any form of RAMS, including alternating supination and pronation of the forearm, hand opening and closing, finger taps, heel taps and toe taps on the R.   Gait and Station: The patient has mild difficulty arising out of a deep-seated chair without the use of the hands; he is able to do it on the 2nd attempt. The patient's stride length is decreased and he limps as he has a short leg on the right.  The patient has a positive pull test.     Movement examination AFTER administration of 300 mg of levodopa dissolved in ginger ale and waited 35 min Tone:  There was normal tone in the bilateral upper extremities. Abnormal movements: There is a very rare left upper extremity resting tremor. Coordination: He does have difficulty with hand  opening and closing on the left, but that is primarily because the hand is swollen.  He still has some difficulty with finger taps bilaterally and slightly slow  with supination/pronation of the forearm bilaterally. Gait and Station: The patient arises on the first attempt without use of his hands.  His stride length is improved, but still has decreased arm swing on the left.  He limps due to short leg.   ASSESSMENT/PLAN:  1.  Idiopathic Parkinson's disease.  The patient has tremor, bradykinesia, rigidity and mild postural instability.  -We discussed the diagnosis as well as pathophysiology of the disease.  We discussed treatment options as well as prognostic indicators.  Patient education was provided.  -We talked about safety in the home.  -Long discussion with the patient today.  I discussed with him the importance of compliance with medication, as well as with follow-up visits.  He continues to be noncompliant with medication.  He also has been noncompliant with follow-up visits.  -was here for levodopa challenge but took med today but didn't yet take midday dose (and suspect he never does based on refill hx but he denies that).  Full UPDRS motor on/off not done because of this but did give 300 mg of levodopa in office dissolved in gingerale and waited 35 min and re-examined pt. examination was markedly improved after administration of medication.  I suspect that the primary reason he thought it didn't work is because he does not take it properly, and the secondary reason is the dose is too low.  I will increase his carbidopa/levodopa 25/100, to 2 tablets 3 times per day and encouraged him to take the medication faithfully and as directed.  The patient admitted today that he felt much better after I gave him levodopa, and I told him the medication works if he will take it.  -I talked to the patient about safe, cardiovascular exercise.  -We discussed community resources in the area including patient support groups and community exercise programs for PD and pt education was provided to the patient. 2.  Memory change.  -His MoCA was not good last visit, but I  suspect that this is not all memory loss and suspect that some of this is poor education level.  He may need neuropsych testing in the future to help separate this out. 3.  Chronic pain syndrome  -The patient is aware that our office does not treat chronic pain and that we are a non-opioid prescribing practice.  He was aware of this prior to making this appointment today.  Unfortunately, he has violated the terms of others contracts in regards to his pain, so his orthopedic physician and his primary care physician will no longer treat his pain either. 4.  Left arm swelling  -We will order an ultrasound just to make sure no blood clot.  He reports that he has already seen his primary care physician 2 days ago about it.  I did not see in no about this complaint. 5.  Follow-up in the next several months, sooner should new neurologic issues arise.  Face-to-face visit time was 60 minutes (in two 30 minute blocks) and greater than 50% of that was in counseling and coordinating care.

## 2015-08-26 NOTE — Patient Instructions (Addendum)
Increase Levodopa 2 tablets 3 times daily  We have you scheduled for your Doppler (ultrasound of arm) on 08/31/15 at 3:00 pm. Please arrive 15 minutes prior and go to [facility], section A. If this is not a good date/time please call [phone] to reschedule.

## 2015-08-31 ENCOUNTER — Ambulatory Visit (HOSPITAL_COMMUNITY): Payer: Medicare Other

## 2015-09-05 ENCOUNTER — Telehealth: Payer: Self-pay | Admitting: Neurology

## 2015-09-05 ENCOUNTER — Ambulatory Visit (HOSPITAL_COMMUNITY)
Admission: RE | Admit: 2015-09-05 | Discharge: 2015-09-05 | Disposition: A | Discharge status: Home / Self Care | Payer: Medicare Other | Source: Ambulatory Visit | Attending: Neurology | Admitting: Neurology

## 2015-09-05 DIAGNOSIS — M7989 Other specified soft tissue disorders: Secondary | ICD-10-CM | POA: Diagnosis not present

## 2015-09-05 NOTE — Telephone Encounter (Signed)
-----   Message from [place], DO sent at 09/05/2015  4:00 PM EDT ----- Let pt know that u/s looks fine

## 2015-09-05 NOTE — Telephone Encounter (Signed)
Patient made aware.

## 2015-09-05 NOTE — Progress Notes (Signed)
Preliminary results by tech  Left Upper Ext. Venous Duplex Completed. Negative for deep and superficial vein thrombosis. Results given to office. Oda Cogan, BS, RDMS, RVT

## 2015-09-05 NOTE — Telephone Encounter (Signed)
Call report - doppler negative for DVT.

## 2015-09-16 ENCOUNTER — Ambulatory Visit (INDEPENDENT_AMBULATORY_CARE_PROVIDER_SITE_OTHER): Payer: Medicare Other | Admitting: Student

## 2015-09-16 ENCOUNTER — Encounter: Payer: Self-pay | Admitting: Student

## 2015-09-16 VITALS — BP 175/85 | HR 81 | Temp 98.3°F | Ht 69.0 in | Wt 207.6 lb

## 2015-09-16 DIAGNOSIS — F32A Depression, unspecified: Secondary | ICD-10-CM

## 2015-09-16 DIAGNOSIS — G8929 Other chronic pain: Secondary | ICD-10-CM | POA: Diagnosis not present

## 2015-09-16 DIAGNOSIS — G894 Chronic pain syndrome: Secondary | ICD-10-CM

## 2015-09-16 DIAGNOSIS — F329 Major depressive disorder, single episode, unspecified: Secondary | ICD-10-CM | POA: Diagnosis not present

## 2015-09-16 DIAGNOSIS — M549 Dorsalgia, unspecified: Secondary | ICD-10-CM

## 2015-09-16 MED ORDER — DULOXETINE HCL 60 MG PO CPEP: 60.0000 mg | ORAL_CAPSULE | Freq: Every day | ORAL | Status: DC

## 2015-09-16 MED ORDER — HYDROCODONE-ACETAMINOPHEN 7.5-325 MG PO TABS: 1.0000 | ORAL_TABLET | Freq: Three times a day (TID) | ORAL | Status: DC | PRN

## 2015-09-16 NOTE — Patient Instructions (Signed)
It was great seeing you today! We have addressed the following issues today   1. Back pain: Refilled a prescription for Norco. I have also started a new medication called duloxetine (Cymbalta). I would like you to stop taking Effexor (venlafaxine). Give [place] a call or come back and see [place] if you have any problem with the new medication.    If we did any lab work today, and the results require attention, either me or my nurse will get in touch with you. If everything is normal, you will get a letter in mail. If you don't hear from [place] in two weeks, please give [place] a call. Otherwise, I look forward to talking with you again at our next visit. If you have any questions or concerns before then, please call the clinic at [phone].  Please bring all your medications to every doctors visit   Sign up for My Chart to have easy access to your labs results, and communication with your Primary care physician.    Please check-out at the front desk before leaving the clinic.   Take Care,

## 2015-09-16 NOTE — Assessment & Plan Note (Addendum)
Chronic back pain. No red flags. -Refilled his Norco 7.5/325 mg quantity #90 for 30 day supply -Urine for UDS obtained -Pain contract signed -Database reviewed. No suspicious activity noted.  -Started trial of Cymbalta 60 mg daily. Advised to stop for concerning side effects -Will see patient again in 1 month to assess medication tolerance and improvement. Will discuss about health maintenance as well at that time

## 2015-09-16 NOTE — Progress Notes (Addendum)
   Subjective:    Patient ID: Tyler Lester, male    DOB: 04/18/1945, 70 y.o.   MRN: [id number]  CC: hip pain  HPI #Right back pain: this has been going on for about 10 years. No changes in nature. Pain sharp like stabbing. 10/10 When not taking medication. Took his last dose of norco this morning. Pain medicine enables him to walk and exercise. Can walk for about 10 minutes. Goes to gym about 3 days a week. Does stationary bicycle. Can do ADLS without difficulty. Lives alone. Denies fever, night sweats, unintentional weight loss, saddle parasthesia, incontinence.   Review of Systems  Per history of present illness Objective:   Physical Exam Filed Vitals:   09/16/15 1403  BP: 175/85  Pulse: 81  Temp: 98.3 F (36.8 C)  TempSrc: Oral  Height: 5\' 9"  (1.753 m)  Weight: 207 lb 9.6 oz (94.167 kg)    GEN: appears well, NAD CVS: RRR, normal s1 and s2, no edema RESP: no increased work of breathing GI: soft, non-tender,non-distended, +BS MSK: Tenderness to palpation over right paraspinal muscles NEURO: Pill rolling tremor in left arm at rest, limping gait. Uses cane PSYCH: appropriate mood and affect     Assessment & Plan:  Chronic pain syndrome Chronic back pain. No red flags. -Refilled his Norco 7.5/325 mg quantity #90 for 30 day supply -Urine for UDS obtained -Pain contract signed -Database reviewed. No suspicious activity noted.  -Started trial of Cymbalta 60 mg daily. Advised to stop for concerning side effects -Will see patient again in 1 month to assess medication tolerance and improvement. Will discuss about health maintenance as well at that time  Addendum:  Depression Stopped his Effexor today. Started Cymbalta as this could help with depression and chronic pain.

## 2015-09-16 NOTE — Assessment & Plan Note (Signed)
Stopped his Effexor today. Started Cymbalta as this could help with depression and chronic pain.

## 2015-09-17 LAB — PAIN MGMT, PROFILE 5 W/O MEDMATCH U
Amphetamines: NEGATIVE ng/mL (ref <500)
Barbiturates: NEGATIVE ng/mL (ref <300)
Benzodiazepines: NEGATIVE ng/mL (ref <100)
Cocaine Metabolite: NEGATIVE ng/mL (ref <150)
Creatinine: 99.7 mg/dL (ref >=20.0)
Marijuana Metabolite: NEGATIVE ng/mL (ref <20)
Methadone Metabolite: NEGATIVE ng/mL (ref <100)
Opiates: NEGATIVE ng/mL (ref <100)
Oxidant: NEGATIVE ug/mL (ref <200)
Oxycodone: NEGATIVE ng/mL (ref <100)
pH: 7.63 (ref 4.5–9.0)

## 2015-09-20 ENCOUNTER — Telehealth: Payer: Self-pay | Admitting: Student

## 2015-09-20 NOTE — Telephone Encounter (Signed)
Left voice message for patient to call for an appointment.  Martin, Tamika L, RN  

## 2015-09-20 NOTE — Telephone Encounter (Signed)
Pt was given a new medication recently and it is making him sick (throwing up). He would like someone to call him [phone] ep

## 2015-09-20 NOTE — Telephone Encounter (Signed)
Left voice message for patient to return nurse call or call to schedule an appointment to discuss medication reaction with the provider.  Derl Barrow, RN

## 2015-09-21 ENCOUNTER — Telehealth: Payer: Self-pay | Admitting: *Deleted

## 2015-09-21 NOTE — Telephone Encounter (Signed)
Just realized that Cymbalta is a capsule. The options are: 1.  Stopping Cymbalta and resuming his Effexor 2. Trying low dose Cymbalta, 30 mg instead of 60 mg. I can send in an Rx for this if interested.  LVM again about these options. Let me know his decision/choice when he calls back. Thanks! Bretta Bang

## 2015-09-21 NOTE — Telephone Encounter (Signed)
LVM for pt to call back to inform him of below and see what he decides to to. Katharina Caper, [name], [name]

## 2015-09-21 NOTE — Telephone Encounter (Signed)
Spoke with Tyler Lester and informed him of your message. Tyler Lester wants to stop taking cymbalta completely and just continue taking his other meds instead. Perseus Westall Kennon Holter, CMA

## 2015-09-21 NOTE — Telephone Encounter (Signed)
Called patient and left a voicemail to stop the Cymbalta and stick with Norco if he continues to throw up with Cymbalta. The alternative would be taking half of his Cymbalta dose which will be 30 mg daily for a week and then increasing to 60 if he tolerates that. Thanks! Bretta Bang

## 2015-10-03 NOTE — Telephone Encounter (Signed)
Pt called and would like to have a referral to have a colonoscopy done. jw

## 2015-10-05 ENCOUNTER — Other Ambulatory Visit: Payer: Self-pay | Admitting: Student

## 2015-10-05 DIAGNOSIS — Z1211 Encounter for screening for malignant neoplasm of colon: Secondary | ICD-10-CM

## 2015-10-05 NOTE — Telephone Encounter (Signed)
LVM for pt to call back to check in and see if he had gotten the information below and to see what decision he had chosen. Katharina Caper, Lakeyshia Tuckerman D, [name]

## 2015-10-14 NOTE — Telephone Encounter (Signed)
Pt has an appt with DR. Gonfa on 10/24/2015. Ottis Stain, CMA

## 2015-10-20 ENCOUNTER — Encounter: Payer: Self-pay | Admitting: Student

## 2015-10-20 ENCOUNTER — Ambulatory Visit (INDEPENDENT_AMBULATORY_CARE_PROVIDER_SITE_OTHER): Payer: Medicare Other | Admitting: Student

## 2015-10-20 VITALS — BP 136/70 | HR 88 | Temp 97.4°F | Ht 69.0 in | Wt 206.0 lb

## 2015-10-20 DIAGNOSIS — R05 Cough: Secondary | ICD-10-CM

## 2015-10-20 DIAGNOSIS — R0982 Postnasal drip: Secondary | ICD-10-CM | POA: Diagnosis not present

## 2015-10-20 DIAGNOSIS — I10 Essential (primary) hypertension: Secondary | ICD-10-CM

## 2015-10-20 DIAGNOSIS — J309 Allergic rhinitis, unspecified: Secondary | ICD-10-CM | POA: Diagnosis not present

## 2015-10-20 DIAGNOSIS — R059 Cough, unspecified: Secondary | ICD-10-CM

## 2015-10-20 DIAGNOSIS — R7303 Prediabetes: Secondary | ICD-10-CM

## 2015-10-20 DIAGNOSIS — G8929 Other chronic pain: Secondary | ICD-10-CM

## 2015-10-20 DIAGNOSIS — G894 Chronic pain syndrome: Secondary | ICD-10-CM

## 2015-10-20 DIAGNOSIS — E785 Hyperlipidemia, unspecified: Secondary | ICD-10-CM

## 2015-10-20 LAB — BASIC METABOLIC PANEL WITH GFR
BUN: 21 mg/dL (ref 7–25)
CO2: 27 mmol/L (ref 20–31)
Calcium: 9 mg/dL (ref 8.6–10.3)
Chloride: 104 mmol/L (ref 98–110)
Creat: 1.53 mg/dL — ABNORMAL HIGH (ref 0.70–1.25)
GFR, Est African American: 53 mL/min — ABNORMAL LOW (ref >=60)
GFR, Est Non African American: 46 mL/min — ABNORMAL LOW (ref >=60)
Glucose, Bld: 124 mg/dL — ABNORMAL HIGH (ref 65–99)
Potassium: 3.7 mmol/L (ref 3.5–5.3)
Sodium: 141 mmol/L (ref 135–146)

## 2015-10-20 LAB — LIPID PANEL
Cholesterol: 159 mg/dL (ref 125–200)
HDL: 32 mg/dL — ABNORMAL LOW (ref >=40)
LDL Cholesterol: 66 mg/dL (ref <130)
Total CHOL/HDL Ratio: 5 Ratio (ref <=5.0)
Triglycerides: 307 mg/dL — ABNORMAL HIGH (ref <150)
VLDL: 61 mg/dL — ABNORMAL HIGH (ref <30)

## 2015-10-20 MED ORDER — LORATADINE 10 MG PO TABS: 10.0000 mg | ORAL_TABLET | Freq: Every day | ORAL | 2 refills | Status: DC

## 2015-10-20 MED ORDER — HYDROCODONE-ACETAMINOPHEN 7.5-325 MG PO TABS: 1.0000 | ORAL_TABLET | Freq: Three times a day (TID) | ORAL | 0 refills | Status: DC | PRN

## 2015-10-20 MED ORDER — FLUTICASONE PROPIONATE 50 MCG/ACT NA SUSP: 2.0000 | Freq: Every day | NASAL | 5 refills | Status: DC

## 2015-10-20 NOTE — Assessment & Plan Note (Addendum)
No new changes. Didn't tolerate Cymbalta so discontinued I resumed his Effexor. He has been out of his medication for the last 3 days. No red flags at this time. It is good that he is using a walker now especially given his Parkinson disease.  -Gave him 3 prescriptions for Norco 7.5/325 for the next 3 months to be filled today, 11/20/2015 and 12/20/2015. Each prescription with 90 tablets.  -Advised him to come back and see me for his annual physical exam to discuss about his other health issues

## 2015-10-20 NOTE — Assessment & Plan Note (Addendum)
Blood pressure at goal. He is on lisinopril 40 mg and Lasix. We will check BMP today

## 2015-10-20 NOTE — Progress Notes (Signed)
   Subjective:    Patient ID: Tyler Lester is a 70 y.o. old male.  HPI #Lower back and hip pain: this is a chronic Issue. No changes. Pain location is bilateral. Reports radiation to his knees more on the right than left. He is on Norco 7.5/325. He has been out of his medication for the last three days. Denies fever, chills, urinary or bowel incontinence. Denies numbness or tingling in his legs. Denies fall. Started using walker. Goes to [facility] twice a week. He does stationary bicycles and lifting weight. We tried Cymbalta last month but patient couldn't tolerate the side effects. So we discontinued Cymbalta.  #Cough: Reports some postnasal drainage. Cough is intermittently productive with whitish to grayish phlegm. He denies hemoptysis. He is on Flonase and loratadine. He is out of this medication. Denies shortness of breath, fever or chest pain.   PMH: reviewed SH: Denies smoking or drug use  Review of Systems Per HPI Objective:   Vitals:   10/20/15 1108  BP: 136/70  Pulse: 88  Temp: 97.4 F (36.3 C)  TempSrc: Oral  Weight: 206 lb (93.4 kg)  Height: 5\' 9"  (1.753 m)   GEN: appears well, NAD CVS: RRR, normal s1 and s2, no edema RESP: no increased work of breathing GI: soft, non-tender,non-distended, +BS MSK: No swelling or overlying skin changes in his knees, no tenderness to palpation over his lower spinal and paraspinal muscles, his thighs or knees.  NEURO: Pill rolling tremor in left arm at rest, limping gait. Uses walker PSYCH: appropriate mood and affect     Assessment & Plan:  Chronic pain syndrome No new changes. Didn't tolerate Cymbalta so discontinued I resumed his Effexor. He has been out of his medication for the last 3 days. No red flags at this time. It is good that he is using a walker now especially given his Parkinson disease.  -Gave him 3 prescriptions for Norco 7.5/325 for the next 3 months to be filled today, 11/20/2015 and 12/20/2015. Each prescription with  90 tablets.  -Advised him to come back and see me for his annual physical exam to discuss about his other health issues  Essential hypertension Blood pressure at goal. He is on lisinopril 40 mg and Lasix. We will check BMP today  Cough Likely secondary to postnasal drip. Refilled his prescription for Flonase and loratadine today.   Prediabetes Last A1c 6.5 on 08/24/2015. Repeat A1c in one year

## 2015-10-20 NOTE — Patient Instructions (Addendum)
It was great seeing you today! We have addressed the following issues today  1. Back pain: I have refilled your Norco prescription for three months.  2. Cough: This is likely due to allergy. I have refilled her allergy medications today 3. Annual physical: I recommend he come back and see me for your annual physical so that we can talk about your other health issues.     If we did any lab work today, and the results require attention, either me or my nurse will get in touch with you. If everything is normal, you will get a letter in mail. If you don't hear from [place] in two weeks, please give [place] a call. Otherwise, I look forward to talking with you again at our next visit. If you have any questions or concerns before then, please call the clinic at [phone].  Please bring all your medications to every doctors visit   Sign up for My Chart to have easy access to your labs results, and communication with your Primary care physician.    Please check-out at the front desk before leaving the clinic.   Take Care,

## 2015-10-20 NOTE — Assessment & Plan Note (Signed)
Last A1c 6.5 on 08/24/2015. Repeat A1c in one year

## 2015-10-20 NOTE — Assessment & Plan Note (Signed)
Likely secondary to postnasal drip. Refilled his prescription for Flonase and loratadine today.

## 2015-10-22 ENCOUNTER — Other Ambulatory Visit: Payer: Self-pay | Admitting: Family Medicine

## 2015-10-24 ENCOUNTER — Ambulatory Visit: Payer: Medicare Other | Admitting: Student

## 2015-10-24 ENCOUNTER — Other Ambulatory Visit: Payer: Self-pay | Admitting: Student

## 2015-10-24 DIAGNOSIS — I5032 Chronic diastolic (congestive) heart failure: Secondary | ICD-10-CM

## 2015-10-24 DIAGNOSIS — I10 Essential (primary) hypertension: Secondary | ICD-10-CM

## 2015-10-28 ENCOUNTER — Encounter: Payer: Self-pay | Admitting: Podiatry

## 2015-10-28 ENCOUNTER — Ambulatory Visit (INDEPENDENT_AMBULATORY_CARE_PROVIDER_SITE_OTHER): Payer: Medicare Other | Admitting: Podiatry

## 2015-10-28 DIAGNOSIS — M79676 Pain in unspecified toe(s): Secondary | ICD-10-CM | POA: Diagnosis not present

## 2015-10-28 DIAGNOSIS — B351 Tinea unguium: Secondary | ICD-10-CM

## 2015-10-28 DIAGNOSIS — E1149 Type 2 diabetes mellitus with other diabetic neurological complication: Secondary | ICD-10-CM

## 2015-10-28 MED ORDER — NONFORMULARY OR COMPOUNDED ITEM: 2 refills | Status: DC

## 2015-11-02 DIAGNOSIS — D128 Benign neoplasm of rectum: Secondary | ICD-10-CM | POA: Diagnosis not present

## 2015-11-06 NOTE — Progress Notes (Signed)
Patient ID: Tyler Lester, male   DOB: 1946/02/04, 70 y.o.   MRN: [id number]   Subjective: 70 y.o.-year-old male returns the office today for painful, elongated, thickened toenails which he is unable to trim himself. Denies any surrounding redness or drainage. He states the brace is doing well. Denies any acute changes since last appointment and no new complaints today. Denies any systemic complaints such as fevers, chills, nausea, vomiting.   Objective: AAO 3, NAD DP/PT pulses decreased, CRT less than 3 seconds Nails hypertrophic, dystrophic, elongated, brittle, discolored 10. There is tenderness overlying the nails 1-5 bilaterally. There is no surrounding erythema or drainage along the nail sites. No open lesions or pre-ulcerative lesions are identified bilaterally No other areas of tenderness bilateral lower extremities. No overlying edema, erythema, increased warmth. Hammertoe and HAV bilaterally.  No pain with calf compression, swelling, warmth, erythema.  Assessment: Patient presents with symptomatic onychomycosis  Plan: -Treatment options including alternatives, risks, complications were discussed -Nails sharply debrided 10 without complication/bleeding. -Discussed daily foot inspection. If there are any changes, to call the office immediately.  -Follow-up in 3 months or sooner if any problems are to arise. In the meantime, encouraged to call the office with any questions, concerns, changes symptoms.  *Did not get diabetic shoes as his doctor did not apparently sign off on them.   Celesta Gentile, DPM

## 2015-11-08 ENCOUNTER — Other Ambulatory Visit: Payer: Self-pay | Admitting: *Deleted

## 2015-11-08 DIAGNOSIS — G8929 Other chronic pain: Secondary | ICD-10-CM

## 2015-11-14 ENCOUNTER — Telehealth: Payer: Self-pay | Admitting: *Deleted

## 2015-11-14 NOTE — Telephone Encounter (Signed)
Patient would also like to know if the order for his shoes has been completed by his PCP yet.  Jazmin Hartsell,CMA

## 2015-11-14 NOTE — Telephone Encounter (Signed)
Patient states that he would like to switch his podiatry care to the office on [address].  He has been waiting for diabetic shoes from his current office with a long delay.  Will forward to MD to advise. Shalicia Craghead,CMA

## 2015-11-21 ENCOUNTER — Encounter: Payer: Self-pay | Admitting: Student

## 2015-11-21 DIAGNOSIS — E118 Type 2 diabetes mellitus with unspecified complications: Secondary | ICD-10-CM | POA: Insufficient documentation

## 2015-11-22 NOTE — Telephone Encounter (Signed)
Sorry for the late response again. I was on vacation last week.  I have never discussed about the shoes or podiatry with the patient. I last saw patient in August for cough and chronic pain and advised him to come back and see me for his other medical conditions. I cannot place a referral to podiatry for a condition I have not evaluated or assessed. However, I would be happy to authorize if I receive an order form/prescription from his podiatry.   Patient didn't pick up the phone when I attempted to call the patient. Thanks!

## 2015-11-24 NOTE — Telephone Encounter (Signed)
Order form (2 pages) for therapeutic shoes signed and placed in outgoing fax box.

## 2015-12-01 ENCOUNTER — Ambulatory Visit (INDEPENDENT_AMBULATORY_CARE_PROVIDER_SITE_OTHER): Payer: Medicare Other | Admitting: Student

## 2015-12-01 VITALS — BP 137/63 | HR 82 | Temp 98.3°F | Ht 69.0 in | Wt 210.4 lb

## 2015-12-01 DIAGNOSIS — M7989 Other specified soft tissue disorders: Secondary | ICD-10-CM

## 2015-12-01 DIAGNOSIS — R0982 Postnasal drip: Secondary | ICD-10-CM

## 2015-12-01 MED ORDER — FLUTICASONE PROPIONATE 50 MCG/ACT NA SUSP: 2.0000 | Freq: Every day | NASAL | 5 refills | Status: DC

## 2015-12-01 MED ORDER — BACLOFEN 10 MG PO TABS: 10.0000 mg | ORAL_TABLET | Freq: Three times a day (TID) | ORAL | 0 refills | Status: DC | PRN

## 2015-12-01 NOTE — Progress Notes (Signed)
   Subjective:    Patient ID: Tyler Lester is a 70 y.o. old male.  HPI #Left arm swelling and pain: For one week. This is a recurrent issue. Reports similar problems in the past in the same arm. It usually improves by soaking in salty water but not this time. He had upper extremity Doppler about 2 months ago which was negative for DVT. Reports 10/10 pain. Pain is mainly over the dorsal aspect of his hand. Pain with movement. Reports weakness. Not sure about numbness. Denies new medicine. Denies trauma.  His Norco 7.5 which is not helping. He is asking if " number 10" could help with the pain. He denies fever, chest pain or cough. She reports shortness of breath that he describes as feeling winded when he walks. However, this is not new for him.  PMH: reviewed  SH: Denies smoking  Review of Systems Per HPI Objective:   Vitals:   12/01/15 1436  BP: 137/63  Pulse: 82  Temp: 98.3 F (36.8 C)  TempSrc: Oral  Weight: 210 lb 6.4 oz (95.4 kg)  Height: 5\' 9"  (1.753 m)    GEN: appears well, no apparent distress. CVS: RRR, normal s1 and s2, no murmurs, no edema, radial pulses 2+ bilaterally RESP: no increased work of breathing, good air movement bilaterally, no crackles or wheeze MSK: Left arm appears bigger and feels warmer than right. Left arm with resting tremor. He has lipoma about 1 inch in diameter over his left shoulder. Passive and active abduction to about 150 on the left. Normal range of motion in the right. He is diffusely tender to palpation over his left upper extremity.  SKIN: Skin over his left arm below his elbow appears erythematous compared to right NEURO: alert and oriented appropriately, motor 5/5 in all muscle groups of upper extremity except for 4/5 grip strength is in left hand. Sensation intact in all dermatomes.  PSYCH: appropriate mood and affect     Assessment & Plan:  Left arm swelling This appears to be a chronic issue. Recent Doppler about 2 months ago  negative for DVT. Low suspicion for cellulitis without systemic fever. He had PSDF of C3-4 through C6-7 in 2015. However this doesn't explain the swelling and skin erythema. After discussing patient with Dr. 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 Unfortunately, patient is already on Lyrica. He did not tolerate trial of Cymbalta in the past for his chronic pain. So, I gave him prescription for baclofen 10 mg after 3 times a day for pain, #20. Discussed return precautions including worsening of his symptoms, shortness of breath, chest pain or fever. If no improvement with baclofen, we may try nortriptyline.  Patient declined flu shot today

## 2015-12-01 NOTE — Patient Instructions (Signed)
It was great seeing you today! We have addressed the following issues today  1. Left arm pain: Sent prescription for pain medicine to the pharmacy. This medicine might make him sleepy. His be careful while you take this medication. Take it only if the pain is not amenable. If no improvement in the pain over the next 1-2 weeks,  please come back and see [place]    If we did any lab work today, and the results require attention, either me or my nurse will get in touch with you. If everything is normal, you will get a letter in mail. If you don't hear from [place] in two weeks, please give [place] a call. Otherwise, I look forward to talking with you again at our next visit. If you have any questions or concerns before then, please call the clinic at [phone].  Please bring all your medications to every doctors visit   Sign up for My Chart to have easy access to your labs results, and communication with your Primary care physician.    Please check-out at the front desk before leaving the clinic.   Take Care,

## 2015-12-01 NOTE — Assessment & Plan Note (Addendum)
This appears to be a chronic issue. Recent Doppler about 2 months ago negative for DVT. Low suspicion for cellulitis without systemic fever. He had PSDF of C3-4 through C6-7 in 2015. However this doesn't explain the swelling and skin erythema. After discussing patient with Dr. 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 Unfortunately, patient is already on Lyrica. He did not tolerate trial of Cymbalta in the past for his chronic pain. So, I gave him prescription for baclofen 10 mg after 3 times a day for pain, #20. Discussed return precautions including worsening of his symptoms, shortness of breath, chest pain or fever. If no improvement with baclofen, we may try nortriptyline.

## 2015-12-02 ENCOUNTER — Other Ambulatory Visit: Payer: Self-pay | Admitting: Student

## 2015-12-02 MED ORDER — BACLOFEN 10 MG PO TABS: 5.0000 mg | ORAL_TABLET | Freq: Two times a day (BID) | ORAL | 0 refills | Status: DC | PRN

## 2015-12-02 NOTE — Progress Notes (Signed)
Realized that his creatinine is elevated after I prescribed him Baclofen. Called pharmacy to cancel prescription and sent in another prescription for 5 mg twice a day (renal dose). Also talked to patient about the change.

## 2015-12-07 ENCOUNTER — Other Ambulatory Visit: Payer: Self-pay | Admitting: Student

## 2015-12-07 DIAGNOSIS — G20 Parkinson's disease: Secondary | ICD-10-CM

## 2015-12-07 NOTE — Telephone Encounter (Signed)
Pt needs a refill on Lyrica and a medication for Parkinson's (pt is unsure what medication that is). Pt uses [facility]. Please advise. Thanks! ep

## 2015-12-08 MED ORDER — CARBIDOPA-LEVODOPA 25-100 MG PO TABS: 2.0000 | ORAL_TABLET | Freq: Three times a day (TID) | ORAL | 5 refills | Status: DC

## 2015-12-08 MED ORDER — PREGABALIN 50 MG PO CAPS: 50.0000 mg | ORAL_CAPSULE | Freq: Three times a day (TID) | ORAL | 3 refills | Status: DC

## 2015-12-19 ENCOUNTER — Ambulatory Visit (INDEPENDENT_AMBULATORY_CARE_PROVIDER_SITE_OTHER): Payer: Medicare Other | Admitting: Podiatry

## 2015-12-19 DIAGNOSIS — I739 Peripheral vascular disease, unspecified: Secondary | ICD-10-CM

## 2015-12-19 DIAGNOSIS — E1149 Type 2 diabetes mellitus with other diabetic neurological complication: Secondary | ICD-10-CM

## 2015-12-19 DIAGNOSIS — G629 Polyneuropathy, unspecified: Secondary | ICD-10-CM | POA: Diagnosis not present

## 2015-12-19 DIAGNOSIS — L97511 Non-pressure chronic ulcer of other part of right foot limited to breakdown of skin: Secondary | ICD-10-CM

## 2015-12-20 ENCOUNTER — Encounter: Payer: Self-pay | Admitting: Student

## 2015-12-20 ENCOUNTER — Ambulatory Visit (INDEPENDENT_AMBULATORY_CARE_PROVIDER_SITE_OTHER): Payer: Medicare Other | Admitting: Student

## 2015-12-20 VITALS — BP 118/70 | HR 70 | Temp 98.1°F | Ht 69.0 in | Wt 209.6 lb

## 2015-12-20 DIAGNOSIS — E118 Type 2 diabetes mellitus with unspecified complications: Secondary | ICD-10-CM

## 2015-12-20 DIAGNOSIS — G894 Chronic pain syndrome: Secondary | ICD-10-CM | POA: Diagnosis not present

## 2015-12-20 DIAGNOSIS — M7989 Other specified soft tissue disorders: Secondary | ICD-10-CM

## 2015-12-20 LAB — POCT GLYCOSYLATED HEMOGLOBIN (HGB A1C): Hemoglobin A1C: 6.7

## 2015-12-20 MED ORDER — ZOSTER VACCINE LIVE 19400 UNT/0.65ML ~~LOC~~ SUSR: 0.6500 mL | Freq: Once | SUBCUTANEOUS | 0 refills | Status: AC

## 2015-12-20 NOTE — Progress Notes (Signed)
Patient presents with a pickup diabetic shoes. One pair of shoes and 3 pairs custom inserts were dispensed. The shoes appear to be fitting well. Discussed oral and written break in instructions with the patient. Monitor daily for any skin breakdown or any irritation and to call the office and medially should any occur. I will see him back for his routine care or sooner if any issues are to arise. I encouraged to call any questions, concerns or any change in symptoms.

## 2015-12-20 NOTE — Patient Instructions (Addendum)
It was great seeing you today! We have addressed the following issues today  1. Left arm swelling: this is probably related to your nerve. I don't think there is a blockage in the blood vessels. It also appears about the same from the last time I saw you. Continue taking the pain medications as needed for pain. Please Come back and see [place] if you have worsening of swelling, pain, fever, chest pain, shortness of breath or other symptoms concerning to you.     Please bring your medication bottles when he come back to see [place] next time.   Take the prescription for your shingles vaccine to the pharmacy.    I also recommend you get a colonoscopy as soon as possible.     If we did any lab work today, and the results require attention, either me or my nurse will get in touch with you. If everything is normal, you will get a letter in mail. If you don't hear from [place] in two weeks, please give [place] a call. Otherwise, we look forward to seeing you again at your next visit. If you have any questions or concerns before then, please call the clinic at [phone].   Please bring all your medications to every doctors visit   Sign up for My Chart to have easy access to your labs results, and communication with your Primary care physician.     Please check-out at the front desk before leaving the clinic.      Take Care,

## 2015-12-22 NOTE — Progress Notes (Signed)
   Subjective:    Patient ID: Tyler Lester is a 70 y.o. old male.  HPI #Left arm swelling: This is a recurrent issue. He was here with similar problem and also left arm pain about 2 weeks ago. I gave him prescription for baclofen. Today he denies pain but he is concerned about the left arm swelling. He had upper extremity Doppler about 2 months ago which was negative for DVT.  Off note, patient had history of cervical myelopathy s/p posterior cervical decompression fusion, cervical 3-4, cervical 4-5, cervical 5-6, cervical 6-7 with instrumentation and allograft on 11/12/2013.  He denies fever, dyspnea, chest pain or cough.   PMH: reviewed  SH: Denies smoking  Review of Systems Per HPI Objective:   Vitals:   12/20/15 1544  BP: 118/70  Pulse: 70  Temp: 98.1 F (36.7 C)  TempSrc: Oral  SpO2: 98%  Weight: 209 lb 9.6 oz (95.1 kg)  Height: 5\' 9"  (1.753 m)   GEN: appears well, no apparent distress. CVS: RRR, normal s1 and s2, no murmurs, no edema, radial pulses 2+ bilaterally RESP: no increased work of breathing, good air movement bilaterally, no crackles or wheeze MSK: Left arm slightly bigger than  Right (unchanged from previous). Left arm with resting tremor. He has lipoma about 1 inch in diameter over his left shoulder. Passive and active abduction to about 150 on the left. Normal range of motion in the right. No tenderness  to palpation over his left upper extremity. Left fifth digit in extension at the proximal interphalangeal joint(chronic due to trauma). (See picture below for more)       SKIN: Skin over his left arm below his elbow appears erythematous compared to right NEURO: alert and oriented appropriately, motor 5/5 in all muscle groups of upper extremity except for 4/5 grip strength is in left hand. Sensation intact in all dermatomes.  PSYCH: appropriate mood and affect     Assessment & Plan:  Left arm swelling This is a chronic and recurrent issue. Swelling  hasn't changed from 2 weeks ago when I saw him for similar problem. 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 We treated the pain with baclofen about 2 weeks ago. Now pain has resolved but not swelling.  Recent Doppler about 2 months ago negative for DVT. He never had any vascular procedure such as PICC line in his left arm.  Low suspicion for cellulitis without fever or focal erythema. Has history of cervical myelopathy status post PSDF of C3-4 through C6-7 in 2015. However this doesn't explain the swelling.  If pain or worsening of swelling we may consider nortriptyline for reflux sympathetic pain syndrome. Otherwise we will continue to monitor. I have discussed return precautions including shortness of breath, chest pain, fever or other symptoms concerning to him.  Patient declined flu shot today.

## 2015-12-22 NOTE — Assessment & Plan Note (Signed)
This is a chronic and recurrent issue. Swelling hasn't changed from 2 weeks ago when I saw him for similar problem. 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 We treated the pain with baclofen about 2 weeks ago. Now pain has resolved but not swelling.  Recent Doppler about 2 months ago negative for DVT. He never had any vascular procedure such as PICC line in his left arm.  Low suspicion for cellulitis without fever or focal erythema. Has history of cervical myelopathy status post PSDF of C3-4 through C6-7 in 2015. However this doesn't explain the swelling.  If pain or worsening of swelling we may consider nortriptyline for reflux sympathetic pain syndrome. Otherwise we will continue to monitor. I have discussed return precautions including shortness of breath, chest pain, fever or other symptoms concerning to him.

## 2015-12-23 ENCOUNTER — Emergency Department (HOSPITAL_COMMUNITY)
Admission: EM | Admit: 2015-12-23 | Discharge: 2015-12-23 | Disposition: A | Discharge status: Home / Self Care | Payer: Medicare Other | Attending: Emergency Medicine | Admitting: Emergency Medicine

## 2015-12-23 ENCOUNTER — Encounter (HOSPITAL_COMMUNITY): Payer: Self-pay | Admitting: Emergency Medicine

## 2015-12-23 DIAGNOSIS — R109 Unspecified abdominal pain: Secondary | ICD-10-CM | POA: Diagnosis not present

## 2015-12-23 DIAGNOSIS — N182 Chronic kidney disease, stage 2 (mild): Secondary | ICD-10-CM | POA: Insufficient documentation

## 2015-12-23 DIAGNOSIS — E1122 Type 2 diabetes mellitus with diabetic chronic kidney disease: Secondary | ICD-10-CM | POA: Diagnosis not present

## 2015-12-23 DIAGNOSIS — R339 Retention of urine, unspecified: Secondary | ICD-10-CM | POA: Diagnosis not present

## 2015-12-23 DIAGNOSIS — Z79899 Other long term (current) drug therapy: Secondary | ICD-10-CM | POA: Diagnosis not present

## 2015-12-23 DIAGNOSIS — R1030 Lower abdominal pain, unspecified: Secondary | ICD-10-CM | POA: Diagnosis not present

## 2015-12-23 DIAGNOSIS — I129 Hypertensive chronic kidney disease with stage 1 through stage 4 chronic kidney disease, or unspecified chronic kidney disease: Secondary | ICD-10-CM | POA: Insufficient documentation

## 2015-12-23 LAB — URINE MICROSCOPIC-ADD ON
Squamous Epithelial / LPF: NONE SEEN
WBC, UA: NONE SEEN WBC/hpf (ref 0–5)

## 2015-12-23 LAB — URINALYSIS, ROUTINE W REFLEX MICROSCOPIC
Bilirubin Urine: NEGATIVE
Glucose, UA: NEGATIVE mg/dL
Ketones, ur: NEGATIVE mg/dL
Leukocytes, UA: NEGATIVE
Nitrite: NEGATIVE
Protein, ur: 100 mg/dL — AB
Specific Gravity, Urine: 1.007 (ref 1.005–1.030)
pH: 6 (ref 5.0–8.0)

## 2015-12-23 NOTE — ED Notes (Signed)
Spoke with lab, urine results should be available in about 15-20 mins.

## 2015-12-23 NOTE — ED Triage Notes (Signed)
Pt reports that he has been unable to urinate fully since 0400 this am. Urinated a little just before EMS arrival. Hx of enlarged prostate. Had a foley inserted last year for retention.

## 2015-12-23 NOTE — ED Notes (Signed)
Bed: [id number] Expected date: 12/23/15 Expected time: 8:04 AM Means of arrival: Ambulance Comments: Unable to Void

## 2015-12-23 NOTE — ED Provider Notes (Signed)
[facility] DEPT Provider Note   CSN: [id number] Arrival date & time: 12/23/15  0808     History   Chief Complaint Chief Complaint  Patient presents with  . Urinary Retention    HPI Tyler Lester is a 70 y.o. male.  HPI Patient presents with urinary retention. Last urinated around 4:00 last night. Previous history of enlarged prostate. Has had a Foley in the past. Now feels that he can go. When I see the patient in the room GERD has his Foley placement feels much better. No dysuria. No fevers or chills. He is already on Flomax. He does have a urologist.   Past Medical History:  Diagnosis Date  . Bilateral foot pain   . Chronic pain   . Diabetes mellitus   . Hyperlipidemia   . Hypertension   . Inguinal hernia    right  . Numbness    T/O  . Parkinson's disease ([place])   . Pneumonia   . Shortness of breath   . Sleep apnea     Patient Active Problem List   Diagnosis Date Noted  . Diabetes ([place]) 11/21/2015  . Chronic pain syndrome 09/16/2015  . Parkinson disease ([place]) 08/26/2015  . Rhinitis, allergic 08/25/2015  . Dyspnea 06/22/2015  . Shortness of breath 03/28/2015  . Gait abnormality 03/28/2015  . Left arm swelling 11/11/2014  . Hypokalemia 11/11/2014  . Cough 10/08/2014  . Postlaminectomy syndrome, cervical region 07/23/2014  . Parkinson's disease ([place]) 07/23/2014  . 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 Back pain with sciatica 05/27/2014  . Bilateral foot pain 05/14/2014  . Myelopathy ([place]) 11/12/2013  . Depression 10/15/2013  . CKD Stage 2 08/25/2013  . Cervical spine degeneration 07/13/2013  . Tremor 05/23/2013  . Chronic diastolic heart failure ([place]) 05/06/2013  . Knee pain, bilateral 12/12/2012  . Nail dystrophy 05/14/2012  . Fatigue 07/05/2011  . GERD (gastroesophageal reflux disease) 07/04/2011  . 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 Prediabetes 01/05/2011  . Essential hypertension 01/05/2011  . Anxiety 01/05/2011    Past  Surgical History:  Procedure Laterality Date  . CATARACT EXTRACTION W/ INTRAOCULAR LENS IMPLANT Left 06/2012   laser for posterior capsule?  . COLON SURGERY    . POSTERIOR CERVICAL FUSION/FORAMINOTOMY N/A 11/12/2013   Procedure: Posterior cervical decompression fusion, cervical 3-4, cervical 4-5, cervical 5-6, cervical 6-7 with instrumentation and allograft   (LEVEL 4);  Surgeon: Sinclair Ship, MD;  Location: [facility];  Service: Orthopedics;  Laterality: N/A;  Posterior cervical decompression fusion, cervical 3-4, cervical 4-5, cervical 5-6, cervical 6-7 with instrumentation and allograft  . TONSILLECTOMY         Home Medications    Prior to Admission medications   Medication Sig Start Date End Date Taking? Authorizing Provider  atorvastatin (LIPITOR) 20 MG tablet Take 1 tablet (20 mg total) by mouth daily. 04/08/13   Kandis Nab, MD  baclofen (LIORESAL) 10 MG tablet Take 0.5 tablets (5 mg total) by mouth 2 (two) times daily as needed for muscle spasms. 12/02/15   Mercy Riding, MD  carbidopa-levodopa (SINEMET) 25-100 MG tablet Take 2 tablets by mouth 3 (three) times daily. Take 4 hours apart at 8am, noon and 4pm for example. Avoid high protein diet. 12/08/15   Mercy Riding, MD  Cholecalciferol (VITAMIN D3) 400 UNITS tablet Take 2 tablets (800 Units total) by mouth daily. 03/10/12   Vivi Ferns, MD  docusate sodium (COLACE) 100 MG capsule Take 1 capsule (100 mg total) by mouth  2 (two) times daily. 10/08/14   Mercy Riding, MD  finasteride (PROSCAR) 5 MG tablet Take 5 mg by mouth daily. Reported on 02/15/2015 09/15/14   Historical Provider, MD  fluticasone (FLONASE) 50 MCG/ACT nasal spray Place 2 sprays into both nostrils daily. 12/01/15   Mercy Riding, MD  furosemide (LASIX) 40 MG tablet One pill twice daily for one week then one pill daily thereafter. 06/22/15   Zenia Resides, MD  HYDROcodone-acetaminophen ([place]) 7.5-325 MG tablet Take 1 tablet by mouth every 8 (eight) hours as needed  for moderate pain. 10/20/15   Mercy Riding, MD  HYDROcodone-acetaminophen (NORCO) 7.5-325 MG tablet Take 1 tablet by mouth every 8 (eight) hours as needed for moderate pain or severe pain. 10/20/15   Mercy Riding, MD  HYDROcodone-acetaminophen (NORCO) 7.5-325 MG tablet Take 1 tablet by mouth every 8 (eight) hours as needed for moderate pain or severe pain. 10/20/15   Mercy Riding, MD  lisinopril (PRINIVIL,ZESTRIL) 20 MG tablet take 1 tablet by mouth once daily 10/24/15   Mercy Riding, MD  loratadine (CLARITIN) 10 MG tablet Take 1 tablet (10 mg total) by mouth daily. 10/20/15   Mercy Riding, MD  NONFORMULARY OR COMPOUNDED [place]:  Peripheral Neuropathy cream - Bupivacaine 1%, Doxepin 3%, Gabapentin 6%, Pentoxifylline 3%, Topiramate 1%, apply 1-2 grams to affected area 3-4 times daily. 10/28/15   Trula Slade, DPM  pregabalin (LYRICA) 50 MG capsule Take 1 capsule (50 mg total) by mouth 3 (three) times daily. 12/08/15   Mercy Riding, MD  senna (SENOKOT) 8.6 MG TABS tablet Take 1 tablet (8.6 mg total) by mouth 2 (two) times daily. 10/08/14   Mercy Riding, MD  SSD 1 % cream APPLY TOPICALLY DAILY 02/16/15   Trula Slade, DPM  tamsulosin (FLOMAX) 0.4 MG CAPS capsule Take 1 capsule (0.4 mg total) by mouth daily after supper. 03/25/14   Charlesetta Shanks, MD  venlafaxine XR (EFFEXOR-XR) 37.5 MG 24 hr capsule Take 37.5 mg by mouth daily. 08/25/15   Historical Provider, MD    Family History Family History  Problem Relation Age of Onset  . Cancer - Other Other   . Diabetes Other   . Hypertension Other     Social History Social History  Substance Use Topics  . Smoking status: Never Smoker  . Smokeless tobacco: Never Used  . Alcohol use 0.0 oz/week     Comment: 1 shot a day     Allergies   Poison ivy treatments   Review of Systems Review of Systems  Constitutional: Negative for appetite change.  HENT: Negative for congestion.   Respiratory: Negative for shortness of breath.     Cardiovascular: Negative for chest pain.  Gastrointestinal: Positive for abdominal pain.  Genitourinary: Positive for decreased urine volume and enuresis.  Musculoskeletal: Negative for back pain and neck pain.  Skin: Negative for wound.  Neurological: Negative for headaches.  Psychiatric/Behavioral: Negative for confusion.     Physical Exam Updated Vital Signs BP 153/86   Pulse 81   Temp 98.5 F (36.9 C) (Oral)   Resp 16   SpO2 97%   Physical Exam  Constitutional: He appears well-developed.  HENT:  Head: Atraumatic.  Neck: Neck supple.  Cardiovascular: Normal rate.   Pulmonary/Chest: Effort normal.  Abdominal: Soft.  Genitourinary: Penis normal.  Genitourinary Comments: Foley catheter in place  Musculoskeletal: He exhibits no edema.  Neurological: He is alert.  Skin: Skin is warm. Capillary refill  takes less than 2 seconds.     ED Treatments / Results  Labs (all labs ordered are listed, but only abnormal results are displayed) Labs Reviewed  URINALYSIS, ROUTINE W REFLEX MICROSCOPIC (NOT AT [facility]) - Abnormal; Notable for the following:       Result Value   Hgb urine dipstick TRACE (*)    Protein, ur 100 (*)    All other components within normal limits  URINE MICROSCOPIC-ADD ON - Abnormal; Notable for the following:    Bacteria, UA RARE (*)    All other components within normal limits    EKG  EKG Interpretation None       Radiology No results found.  Procedures Procedures (including critical care time)  Medications Ordered in ED Medications - No data to display   Initial Impression / Assessment and Plan / ED Course  I have reviewed the triage vital signs and the nursing notes.  Pertinent labs & imaging results that were available during my care of the patient were reviewed by me and considered in my medical decision making (see chart for details).  Clinical Course    Urinary retention. Feels better after foley. History of same. D/c home with  urology follow up.  Final Clinical Impressions(s) / ED Diagnoses   Final diagnoses:  Urinary retention    New Prescriptions Discharge Medication List as of 12/23/2015 10:45 AM       Davonna Belling, MD 12/28/15 [phone]

## 2015-12-28 DIAGNOSIS — R338 Other retention of urine: Secondary | ICD-10-CM | POA: Diagnosis not present

## 2016-01-12 ENCOUNTER — Other Ambulatory Visit: Payer: Self-pay | Admitting: Urology

## 2016-01-12 DIAGNOSIS — N401 Enlarged prostate with lower urinary tract symptoms: Secondary | ICD-10-CM | POA: Diagnosis not present

## 2016-01-12 DIAGNOSIS — R972 Elevated prostate specific antigen [PSA]: Secondary | ICD-10-CM | POA: Diagnosis not present

## 2016-01-12 DIAGNOSIS — R338 Other retention of urine: Secondary | ICD-10-CM | POA: Diagnosis not present

## 2016-01-13 MED ORDER — MAGNESIUM CITRATE PO SOLN: 1.0000 | Freq: Once | ORAL | Status: DC

## 2016-01-18 ENCOUNTER — Ambulatory Visit (INDEPENDENT_AMBULATORY_CARE_PROVIDER_SITE_OTHER): Payer: Medicare Other | Admitting: Student

## 2016-01-18 VITALS — BP 135/63 | HR 89 | Temp 97.4°F | Ht 69.0 in | Wt 211.8 lb

## 2016-01-18 DIAGNOSIS — Z23 Encounter for immunization: Secondary | ICD-10-CM | POA: Diagnosis not present

## 2016-01-18 DIAGNOSIS — G8929 Other chronic pain: Secondary | ICD-10-CM | POA: Diagnosis not present

## 2016-01-18 DIAGNOSIS — G894 Chronic pain syndrome: Secondary | ICD-10-CM | POA: Diagnosis not present

## 2016-01-18 DIAGNOSIS — H9192 Unspecified hearing loss, left ear: Secondary | ICD-10-CM | POA: Diagnosis not present

## 2016-01-18 MED ORDER — HYDROCODONE-ACETAMINOPHEN 7.5-325 MG PO TABS: 1.0000 | ORAL_TABLET | Freq: Three times a day (TID) | ORAL | 0 refills | Status: DC | PRN

## 2016-01-18 MED ORDER — LORATADINE 10 MG PO TABS: 10.0000 mg | ORAL_TABLET | Freq: Every day | ORAL | 2 refills | Status: DC

## 2016-01-18 NOTE — Patient Instructions (Addendum)
It was great seeing you today! We have addressed the following issues today  1. Diminished hearing in left ear: This is likely due to allergy. Your ear looks normal on exam. I recommend using your flonase and Claritin daily. I also recommend stopping the fluid pill (Lasix).  If no improvement over the next 2-3 weeks, please come back and see [place].  2.   Back pain: I have given you through prescriptions for the next 3 months. Please take the prescriptions to the pharmacy to get them filled.    If we did any lab work today, and the results require attention, either me or my nurse will get in touch with you. If everything is normal, you will get a letter in mail. If you don't hear from [place] in two weeks, please give [place] a call. Otherwise, we look forward to seeing you again at your next visit. If you have any questions or concerns before then, please call the clinic at [phone].   Please bring all your medications to every doctors visit   Sign up for My Chart to have easy access to your labs results, and communication with your Primary care physician.     Please check-out at the front desk before leaving the clinic.    Take Care,

## 2016-01-18 NOTE — Progress Notes (Signed)
   Subjective:    Patient ID: Tyler Lester is a 70 y.o. old male. Patient here to discuss diminished hearing in his left ear and for refill on his pain medication HPI #Diminished hearing in left ear: this has been going on for about a year. Reports difficulty TV clearly without turning the volume up. He hears himself low but he talks louder. His wife asked him why he is talking louder and asked him to be checked. No history of trauma. Denies ringing, vertigo, fullness or pain. Denies discharge. Denies new medicine. Denies q-tips. No fever. He is on lasix 40 mg daily for edema chronically. Last Echo 04/2013 with EF of 55-60% and G1DD, no other abnormality  He reports tearing, runny nose, congestion. He uses his flonase everyday.   #Back pain: this is a chronic Issue. No changes. Pain location is bilateral. Reports radiation to his knees more on the right than left. Denies numbness, tingling or weakness in his legs. He is on Norco 7.5/325. Denies fever, chills, urinary or bowel incontinence. Denies fall. Using walker. Goes to [facility] twice a week when he gets ride. He usually takes a bus.   PMH: reviewed  SH: denies smoking. Drinks beers occasionally. Denies drugs.   Review of Systems Per HPI Objective:   Vitals:   01/18/16 1427  BP: 135/63  Pulse: 89  Temp: 97.4 F (36.3 C)  SpO2: 96%  Weight: 211 lb 12.8 oz (96.1 kg)  Height: 5\' 9"  (1.753 m)    GEN: appears well, no apparent distress. Eyes: without conjunctival injection, sclera anicteric Ears: normal TM and ear canal, no pain with gentle pressure on tragus or tug on pinnae Nares: rhinorrhea, congestion or erythema,  Oropharynx: mmm without erythema or exudation HEM: negative for cervical or periauricular LAD CVS: RRR, normal s1 and s2, no murmurs, no edema RESP: no increased work of breathing, without crackles or wheeze SKIN: no apparent skin lession NEURO: alert and oriented appropriately, no gross defecits  PSYCH:  appropriate mood and affect     Assessment & Plan:  Diminished hearing, left Likely from related to his allergy. He is also on lasix chronically.  -Recommended using Flonase and Claritin daily -Discontinued his lasix. He has no sign of fluid overload.  -If no improvement with the above measures, we may consider referral to ENT/audiology evaluation   Chronic pain syndrome No new changes. No red flags at this time. No fall. Still goes to [facility] twice a week. Lack of transportation is a limiting factor. He depends on public transportation. [place] database reviewed and clean.  -Gave him 3 prescriptions for Norco 7.5/325, #90 each for the next 3 months  Received his flu vaccination today

## 2016-01-20 DIAGNOSIS — H9192 Unspecified hearing loss, left ear: Secondary | ICD-10-CM | POA: Insufficient documentation

## 2016-01-20 NOTE — Assessment & Plan Note (Addendum)
Likely from related to his allergy. He is also on lasix chronically.  -Recommended using Flonase and Claritin daily -Discontinued his lasix. He has no sign of fluid overload.  -If no improvement with the above measures, we may consider referral to ENT/audiology evaluation

## 2016-01-21 NOTE — Assessment & Plan Note (Signed)
No new changes. No red flags at this time. No fall. Still goes to [facility] twice a week. Lack of transportation is a limiting factor. He depends on public transportation. [place] database reviewed and clean.  -Gave him 3 prescriptions for Norco 7.5/325, #90 each for the next 3 months

## 2016-01-23 ENCOUNTER — Other Ambulatory Visit: Payer: Self-pay | Admitting: *Deleted

## 2016-01-23 MED ORDER — VENLAFAXINE HCL ER 37.5 MG PO CP24: 37.5000 mg | ORAL_CAPSULE | Freq: Every day | ORAL | 2 refills | Status: DC

## 2016-01-26 DIAGNOSIS — R338 Other retention of urine: Secondary | ICD-10-CM | POA: Diagnosis not present

## 2016-01-26 DIAGNOSIS — N401 Enlarged prostate with lower urinary tract symptoms: Secondary | ICD-10-CM | POA: Diagnosis not present

## 2016-01-27 ENCOUNTER — Ambulatory Visit: Payer: Medicare Other | Admitting: Podiatry

## 2016-02-10 ENCOUNTER — Ambulatory Visit: Payer: Medicare Other | Admitting: Podiatry

## 2016-02-14 DIAGNOSIS — R338 Other retention of urine: Secondary | ICD-10-CM | POA: Diagnosis not present

## 2016-02-29 ENCOUNTER — Telehealth: Payer: Self-pay | Admitting: Student

## 2016-03-01 NOTE — Patient Instructions (Signed)
Tyler Lester  03/01/2016   Your procedure is scheduled on: 03-07-16  Report to [facility]  Entrance take [facility]  elevators to 3rd floor to  [place] at 1115 AM.  Call this number if you have problems the morning of surgery [phone]   Remember: ONLY 1 PERSON MAY GO WITH YOU TO SHORT STAY TO GET  READY MORNING OF [place].  Do not eat food :After MidnightMonday night. Clear liquids all day Tuesday 03-06-16 per dr Tresa Moore instructions, may have clear liquids until 730 am day of surgery, nothing by mouth after 730 am day of surgery.     Take these medicines the morning of surgery with A SIP OF WATER: Atorvastatin (lipitor)< Baclofen if needed, carvidopa-levidopa(senimet), finasteride (proscar), loratadine (claritin), VenlafaxineXR (Effexor xr), pregabulin (lyrica) DO NOT TAKE ANY DIABETIC MEDICATIONS DAY OF YOUR SURGERY                               You may not have any metal on your body including hair pins and              piercings  Do not wear jewelry, make-up, lotions, powders or perfumes, deodorant             Do not wear nail polish.  Do not shave  48 hours prior to surgery.              Men may shave face and neck.   Do not bring valuables to the hospital. [place].  Contacts, dentures or bridgework may not be worn into surgery.  Leave suitcase in the car. After surgery it may be brought to your room.                  Please read over the following fact sheets you were given: _____________________________________________________________________                CLEAR LIQUID DIET   Foods Allowed                                                                     Foods Excluded  Coffee and tea, regular and decaf                             liquids that you cannot  Plain Jell-O in any flavor                                             see through such as: Fruit ices (not with fruit  pulp)                                     milk, soups, orange juice  Iced Popsicles  All solid food Carbonated beverages, regular and diet                                    Cranberry, grape and apple juices Sports drinks like Gatorade Lightly seasoned clear broth or consume(fat free) Sugar, honey syrup  Sample Menu Breakfast                                Lunch                                     Supper Cranberry juice                    Beef broth                            Chicken broth Jell-O                                     Grape juice                           Apple juice Coffee or tea                        Jell-O                                      Popsicle                                                Coffee or tea                        Coffee or tea  _____________________________________________________________________  [facility] - Preparing for Surgery Before surgery, you can play an important role.  Because skin is not sterile, your skin needs to be as free of germs as possible.  You can reduce the number of germs on your skin by washing with CHG (chlorahexidine gluconate) soap before surgery.  CHG is an antiseptic cleaner which kills germs and bonds with the skin to continue killing germs even after washing. Please DO NOT use if you have an allergy to CHG or antibacterial soaps.  If your skin becomes reddened/irritated stop using the CHG and inform your nurse when you arrive at Short Stay. Do not shave (including legs and underarms) for at least 48 hours prior to the first CHG shower.  You may shave your face/neck. Please follow these instructions carefully:  1.  Shower with CHG Soap the night before surgery and the  morning of Surgery.  2.  If you choose to wash your hair, wash your hair first as usual with your  normal  shampoo.  3.  After you shampoo, rinse your hair and body thoroughly to remove the  shampoo.  4.  Use CHG as you would any other liquid soap.  You can apply chg directly  to the skin and wash                       Gently with a scrungie or clean washcloth.  5.  Apply the CHG Soap to your body ONLY FROM THE NECK DOWN.   Do not use on face/ open                           Wound or open sores. Avoid contact with eyes, ears mouth and genitals (private parts).                       Wash face,  Genitals (private parts) with your normal soap.             6.  Wash thoroughly, paying special attention to the area where your surgery  will be performed.  7.  Thoroughly rinse your body with warm water from the neck down.  8.  DO NOT shower/wash with your normal soap after using and rinsing off  the CHG Soap.                9.  Pat yourself dry with a clean towel.            10.  Wear clean pajamas.            11.  Place clean sheets on your bed the night of your first shower and do not  sleep with pets. Day of Surgery : Do not apply any lotions/deodorants the morning of surgery.  Please wear clean clothes to the hospital/surgery center.  FAILURE TO FOLLOW THESE INSTRUCTIONS MAY RESULT IN THE CANCELLATION OF YOUR SURGERY PATIENT SIGNATURE_________________________________  NURSE SIGNATURE__________________________________  ________________________________________________________________________  WHAT IS A BLOOD TRANSFUSION? Blood Transfusion Information  A transfusion is the replacement of blood or some of its parts. Blood is made up of multiple cells which provide different functions.  Red blood cells carry oxygen and are used for blood loss replacement.  White blood cells fight against infection.  Platelets control bleeding.  Plasma helps clot blood.  Other blood products are available for specialized needs, such as hemophilia or other clotting disorders. BEFORE THE TRANSFUSION  Who gives blood for transfusions?   Healthy volunteers who are fully evaluated to make sure their blood is  safe. This is blood bank blood. Transfusion therapy is the safest it has ever been in the practice of medicine. Before blood is taken from a donor, a complete history is taken to make sure that person has no history of diseases nor engages in risky social behavior (examples are intravenous drug use or sexual activity with multiple partners). The donor's travel history is screened to minimize risk of transmitting infections, such as malaria. The donated blood is tested for signs of infectious diseases, such as HIV and hepatitis. The blood is then tested to be sure it is compatible with you in order to minimize the chance of a transfusion reaction. If you or a relative donates blood, this is often done in anticipation of surgery and is not appropriate for emergency situations. It takes many days to process the donated blood. RISKS AND COMPLICATIONS Although transfusion therapy is very safe and saves many lives, the main dangers of transfusion include:   Getting an infectious disease.  Developing a transfusion reaction. This  is an allergic reaction to something in the blood you were given. Every precaution is taken to prevent this. The decision to have a blood transfusion has been considered carefully by your caregiver before blood is given. Blood is not given unless the benefits outweigh the risks. AFTER THE TRANSFUSION  Right after receiving a blood transfusion, you will usually feel much better and more energetic. This is especially true if your red blood cells have gotten low (anemic). The transfusion raises the level of the red blood cells which carry oxygen, and this usually causes an energy increase.  The nurse administering the transfusion will monitor you carefully for complications. HOME CARE INSTRUCTIONS  No special instructions are needed after a transfusion. You may find your energy is better. Speak with your caregiver about any limitations on activity for underlying diseases you may  have. SEEK MEDICAL CARE IF:   Your condition is not improving after your transfusion.  You develop redness or irritation at the intravenous (IV) site. SEEK IMMEDIATE MEDICAL CARE IF:  Any of the following symptoms occur over the next 12 hours:  Shaking chills.  You have a temperature by mouth above 102 F (38.9 C), not controlled by medicine.  Chest, back, or muscle pain.  People around you feel you are not acting correctly or are confused.  Shortness of breath or difficulty breathing.  Dizziness and fainting.  You get a rash or develop hives.  You have a decrease in urine output.  Your urine turns a dark color or changes to pink, red, or brown. Any of the following symptoms occur over the next 10 days:  You have a temperature by mouth above 102 F (38.9 C), not controlled by medicine.  Shortness of breath.  Weakness after normal activity.  The white part of the eye turns yellow (jaundice).  You have a decrease in the amount of urine or are urinating less often.  Your urine turns a dark color or changes to pink, red, or brown. Document Released: 02/10/2000 Document Revised: 05/07/2011 Document Reviewed: 09/29/2007 [facility] Patient Information 2014 [place], [place].  _______________________________________________________________________

## 2016-03-02 ENCOUNTER — Ambulatory Visit: Payer: Medicare Other | Admitting: Podiatry

## 2016-03-05 ENCOUNTER — Encounter (HOSPITAL_COMMUNITY)
Admission: RE | Admit: 2016-03-05 | Discharge: 2016-03-05 | Disposition: A | Discharge status: Home / Self Care | Payer: Medicare Other | Source: Ambulatory Visit | Attending: Urology | Admitting: Urology

## 2016-03-05 ENCOUNTER — Encounter (HOSPITAL_COMMUNITY): Payer: Self-pay

## 2016-03-05 DIAGNOSIS — Z833 Family history of diabetes mellitus: Secondary | ICD-10-CM | POA: Diagnosis not present

## 2016-03-05 DIAGNOSIS — K429 Umbilical hernia without obstruction or gangrene: Secondary | ICD-10-CM | POA: Diagnosis not present

## 2016-03-05 DIAGNOSIS — Z889 Allergy status to unspecified drugs, medicaments and biological substances status: Secondary | ICD-10-CM | POA: Diagnosis not present

## 2016-03-05 DIAGNOSIS — G20 Parkinson's disease: Secondary | ICD-10-CM | POA: Diagnosis not present

## 2016-03-05 DIAGNOSIS — N401 Enlarged prostate with lower urinary tract symptoms: Secondary | ICD-10-CM | POA: Diagnosis not present

## 2016-03-05 DIAGNOSIS — Z8249 Family history of ischemic heart disease and other diseases of the circulatory system: Secondary | ICD-10-CM | POA: Diagnosis not present

## 2016-03-05 DIAGNOSIS — Z981 Arthrodesis status: Secondary | ICD-10-CM | POA: Diagnosis not present

## 2016-03-05 DIAGNOSIS — S3730XA Unspecified injury of urethra, initial encounter: Secondary | ICD-10-CM | POA: Diagnosis not present

## 2016-03-05 DIAGNOSIS — R338 Other retention of urine: Secondary | ICD-10-CM | POA: Diagnosis not present

## 2016-03-05 DIAGNOSIS — E785 Hyperlipidemia, unspecified: Secondary | ICD-10-CM | POA: Diagnosis not present

## 2016-03-05 DIAGNOSIS — I10 Essential (primary) hypertension: Secondary | ICD-10-CM | POA: Diagnosis not present

## 2016-03-05 DIAGNOSIS — Z961 Presence of intraocular lens: Secondary | ICD-10-CM | POA: Diagnosis not present

## 2016-03-05 DIAGNOSIS — G8929 Other chronic pain: Secondary | ICD-10-CM | POA: Diagnosis not present

## 2016-03-05 DIAGNOSIS — E1140 Type 2 diabetes mellitus with diabetic neuropathy, unspecified: Secondary | ICD-10-CM | POA: Diagnosis not present

## 2016-03-05 HISTORY — DX: Presence of urogenital implants: Z96.0

## 2016-03-05 HISTORY — DX: Retention of urine, unspecified: R33.9

## 2016-03-05 HISTORY — DX: Presence of other specified devices: Z97.8

## 2016-03-05 LAB — BASIC METABOLIC PANEL
Anion gap: 8 (ref 5–15)
BUN: 30 mg/dL — ABNORMAL HIGH (ref 6–20)
CO2: 26 mmol/L (ref 22–32)
Calcium: 9 mg/dL (ref 8.9–10.3)
Chloride: 105 mmol/L (ref 101–111)
Creatinine, Ser: 1.35 mg/dL — ABNORMAL HIGH (ref 0.61–1.24)
GFR calc Af Amer: >60 mL/min (ref >60)
GFR calc non Af Amer: 52 mL/min — ABNORMAL LOW (ref >60)
Glucose, Bld: 142 mg/dL — ABNORMAL HIGH (ref 65–99)
Potassium: 3.6 mmol/L (ref 3.5–5.1)
Sodium: 139 mmol/L (ref 135–145)

## 2016-03-05 LAB — CBC
HCT: 40.9 % (ref 39.0–52.0)
Hemoglobin: 13.5 g/dL (ref 13.0–17.0)
MCH: 28.7 pg (ref 26.0–34.0)
MCHC: 33 g/dL (ref 30.0–36.0)
MCV: 87 fL (ref 78.0–100.0)
Platelets: 173 10*3/uL (ref 150–400)
RBC: 4.7 MIL/uL (ref 4.22–5.81)
RDW: 13.5 % (ref 11.5–15.5)
WBC: 9.2 10*3/uL (ref 4.0–10.5)

## 2016-03-05 LAB — GLUCOSE, CAPILLARY: Glucose-Capillary: 137 mg/dL — ABNORMAL HIGH (ref 65–99)

## 2016-03-05 LAB — ABO/RH: ABO/RH(D): B POS

## 2016-03-06 LAB — HEMOGLOBIN A1C
Hgb A1c MFr Bld: 7.3 % — ABNORMAL HIGH (ref 4.8–5.6)
Mean Plasma Glucose: 163 mg/dL

## 2016-03-06 MED ORDER — DEXTROSE 5 % IV SOLN
5.0000 mg/kg | INTRAVENOUS | Status: AC
  Administered 2016-03-07: 400 mg via INTRAVENOUS
  Filled 2016-03-06 (×2): qty 10

## 2016-03-06 NOTE — Progress Notes (Signed)
BMP reports routed to Dr. Alexis Frock via [facility] to [facility]

## 2016-03-07 ENCOUNTER — Inpatient Hospital Stay (HOSPITAL_COMMUNITY)
Admission: RE | Admit: 2016-03-07 | Discharge: 2016-03-09 | DRG: 707 | Disposition: A | Discharge status: Home / Self Care | Payer: Medicare Other | Source: Ambulatory Visit | Attending: Urology | Admitting: Urology

## 2016-03-07 ENCOUNTER — Encounter (HOSPITAL_COMMUNITY)
Admission: RE | Disposition: A | Discharge status: Home / Self Care | Payer: Self-pay | Source: Ambulatory Visit | Attending: Urology

## 2016-03-07 ENCOUNTER — Inpatient Hospital Stay (HOSPITAL_COMMUNITY): Payer: Medicare Other | Admitting: Registered Nurse

## 2016-03-07 ENCOUNTER — Encounter: Payer: Self-pay | Admitting: Student

## 2016-03-07 ENCOUNTER — Encounter (HOSPITAL_COMMUNITY): Payer: Self-pay | Admitting: Registered Nurse

## 2016-03-07 DIAGNOSIS — Z961 Presence of intraocular lens: Secondary | ICD-10-CM | POA: Diagnosis present

## 2016-03-07 DIAGNOSIS — E1140 Type 2 diabetes mellitus with diabetic neuropathy, unspecified: Secondary | ICD-10-CM | POA: Diagnosis not present

## 2016-03-07 DIAGNOSIS — K429 Umbilical hernia without obstruction or gangrene: Secondary | ICD-10-CM | POA: Diagnosis not present

## 2016-03-07 DIAGNOSIS — Z833 Family history of diabetes mellitus: Secondary | ICD-10-CM | POA: Diagnosis not present

## 2016-03-07 DIAGNOSIS — I120 Hypertensive chronic kidney disease with stage 5 chronic kidney disease or end stage renal disease: Secondary | ICD-10-CM | POA: Diagnosis not present

## 2016-03-07 DIAGNOSIS — R338 Other retention of urine: Secondary | ICD-10-CM | POA: Diagnosis present

## 2016-03-07 DIAGNOSIS — Z8249 Family history of ischemic heart disease and other diseases of the circulatory system: Secondary | ICD-10-CM

## 2016-03-07 DIAGNOSIS — E785 Hyperlipidemia, unspecified: Secondary | ICD-10-CM | POA: Diagnosis present

## 2016-03-07 DIAGNOSIS — S3739XA Other injury of urethra, initial encounter: Secondary | ICD-10-CM | POA: Diagnosis not present

## 2016-03-07 DIAGNOSIS — G8929 Other chronic pain: Secondary | ICD-10-CM | POA: Diagnosis present

## 2016-03-07 DIAGNOSIS — N401 Enlarged prostate with lower urinary tract symptoms: Principal | ICD-10-CM | POA: Diagnosis present

## 2016-03-07 DIAGNOSIS — S3730XA Unspecified injury of urethra, initial encounter: Secondary | ICD-10-CM | POA: Diagnosis present

## 2016-03-07 DIAGNOSIS — Z981 Arthrodesis status: Secondary | ICD-10-CM | POA: Diagnosis not present

## 2016-03-07 DIAGNOSIS — I10 Essential (primary) hypertension: Secondary | ICD-10-CM | POA: Diagnosis present

## 2016-03-07 DIAGNOSIS — G20 Parkinson's disease: Secondary | ICD-10-CM | POA: Diagnosis not present

## 2016-03-07 DIAGNOSIS — Z889 Allergy status to unspecified drugs, medicaments and biological substances status: Secondary | ICD-10-CM

## 2016-03-07 DIAGNOSIS — N400 Enlarged prostate without lower urinary tract symptoms: Secondary | ICD-10-CM | POA: Diagnosis present

## 2016-03-07 HISTORY — PX: XI ROBOTIC ASSISTED SIMPLE PROSTATECTOMY: SHX6713

## 2016-03-07 LAB — TYPE AND SCREEN
ABO/RH(D): B POS
Antibody Screen: NEGATIVE

## 2016-03-07 LAB — GLUCOSE, CAPILLARY
Glucose-Capillary: 125 mg/dL — ABNORMAL HIGH (ref 65–99)
Glucose-Capillary: 140 mg/dL — ABNORMAL HIGH (ref 65–99)

## 2016-03-07 LAB — HEMOGLOBIN AND HEMATOCRIT, BLOOD
HCT: 36.4 % — ABNORMAL LOW (ref 39.0–52.0)
Hemoglobin: 11.9 g/dL — ABNORMAL LOW (ref 13.0–17.0)

## 2016-03-07 SURGERY — PROSTATECTOMY, SIMPLE, ROBOT-ASSISTED
Anesthesia: General | Site: Abdomen

## 2016-03-07 MED ORDER — MIDAZOLAM HCL 2 MG/2ML IJ SOLN
INTRAMUSCULAR | Status: AC
  Filled 2016-03-07: qty 2

## 2016-03-07 MED ORDER — LIDOCAINE HCL (CARDIAC) 20 MG/ML IV SOLN
INTRAVENOUS | Status: DC | PRN
  Administered 2016-03-07: 25 mg via INTRAVENOUS
  Administered 2016-03-07: 75 mg via INTRAVENOUS

## 2016-03-07 MED ORDER — ONDANSETRON HCL 4 MG/2ML IJ SOLN
INTRAMUSCULAR | Status: AC
  Filled 2016-03-07: qty 2

## 2016-03-07 MED ORDER — BUPIVACAINE LIPOSOME 1.3 % IJ SUSP
INTRAMUSCULAR | Status: DC | PRN
  Administered 2016-03-07: 20 mL

## 2016-03-07 MED ORDER — ROCURONIUM BROMIDE 50 MG/5ML IV SOSY
PREFILLED_SYRINGE | INTRAVENOUS | Status: AC
Start: 2016-03-07 — End: 2016-03-07
  Filled 2016-03-07: qty 5

## 2016-03-07 MED ORDER — ROCURONIUM BROMIDE 100 MG/10ML IV SOLN
INTRAVENOUS | Status: DC | PRN
  Administered 2016-03-07: 50 mg via INTRAVENOUS
  Administered 2016-03-07 (×2): 10 mg via INTRAVENOUS

## 2016-03-07 MED ORDER — LORATADINE 10 MG PO TABS
10.0000 mg | ORAL_TABLET | Freq: Every day | ORAL | Status: DC
  Administered 2016-03-08 – 2016-03-09 (×2): 10 mg via ORAL
  Filled 2016-03-07 (×2): qty 1

## 2016-03-07 MED ORDER — CARBIDOPA-LEVODOPA 25-100 MG PO TABS
2.0000 | ORAL_TABLET | Freq: Three times a day (TID) | ORAL | Status: DC
  Administered 2016-03-08 – 2016-03-09 (×5): 2 via ORAL
  Filled 2016-03-07 (×5): qty 2

## 2016-03-07 MED ORDER — PHENYLEPHRINE HCL 10 MG/ML IJ SOLN
INTRAMUSCULAR | Status: DC | PRN
  Administered 2016-03-07 (×3): 80 ug via INTRAVENOUS

## 2016-03-07 MED ORDER — SULFAMETHOXAZOLE-TRIMETHOPRIM 800-160 MG PO TABS: 1.0000 | ORAL_TABLET | Freq: Two times a day (BID) | ORAL | 0 refills | Status: AC

## 2016-03-07 MED ORDER — HYDROMORPHONE HCL 1 MG/ML IJ SOLN
INTRAMUSCULAR | Status: DC | PRN
  Administered 2016-03-07 (×2): 0.5 mg via INTRAVENOUS

## 2016-03-07 MED ORDER — SUGAMMADEX SODIUM 200 MG/2ML IV SOLN
INTRAVENOUS | Status: AC
  Filled 2016-03-07: qty 2

## 2016-03-07 MED ORDER — LACTATED RINGERS IV SOLN
INTRAVENOUS | Status: DC
  Administered 2016-03-07: 1000 mL via INTRAVENOUS
  Administered 2016-03-07 (×2): via INTRAVENOUS

## 2016-03-07 MED ORDER — FLUTICASONE PROPIONATE 50 MCG/ACT NA SUSP: 2.0000 | Freq: Every day | NASAL | Status: DC | PRN

## 2016-03-07 MED ORDER — SODIUM CHLORIDE 0.9 % IJ SOLN
INTRAMUSCULAR | Status: DC | PRN
  Administered 2016-03-07: 20 mL

## 2016-03-07 MED ORDER — PROPOFOL 10 MG/ML IV BOLUS
INTRAVENOUS | Status: AC
  Filled 2016-03-07: qty 20

## 2016-03-07 MED ORDER — MORPHINE SULFATE (PF) 2 MG/ML IV SOLN
2.0000 mg | INTRAVENOUS | Status: DC | PRN
  Administered 2016-03-08: 2 mg via INTRAVENOUS
  Filled 2016-03-07: qty 1

## 2016-03-07 MED ORDER — ONDANSETRON HCL 4 MG/2ML IJ SOLN: 4.0000 mg | INTRAMUSCULAR | Status: DC | PRN

## 2016-03-07 MED ORDER — PHENYLEPHRINE 40 MCG/ML (10ML) SYRINGE FOR IV PUSH (FOR BLOOD PRESSURE SUPPORT)
PREFILLED_SYRINGE | INTRAVENOUS | Status: AC
  Filled 2016-03-07: qty 10

## 2016-03-07 MED ORDER — OXYCODONE-ACETAMINOPHEN 5-325 MG PO TABS: 1.0000 | ORAL_TABLET | ORAL | 0 refills | Status: DC | PRN

## 2016-03-07 MED ORDER — SULFAMETHOXAZOLE-TRIMETHOPRIM 800-160 MG PO TABS: 1.0000 | ORAL_TABLET | Freq: Two times a day (BID) | ORAL | 0 refills | Status: DC

## 2016-03-07 MED ORDER — SUCCINYLCHOLINE CHLORIDE 20 MG/ML IJ SOLN
INTRAMUSCULAR | Status: DC | PRN
  Administered 2016-03-07: 120 mg via INTRAVENOUS

## 2016-03-07 MED ORDER — ATORVASTATIN CALCIUM 20 MG PO TABS
20.0000 mg | ORAL_TABLET | Freq: Every day | ORAL | Status: DC
  Administered 2016-03-07 – 2016-03-09 (×3): 20 mg via ORAL
  Filled 2016-03-07 (×3): qty 1

## 2016-03-07 MED ORDER — OXYCODONE HCL 5 MG PO TABS: 10.0000 mg | ORAL_TABLET | ORAL | Status: DC | PRN

## 2016-03-07 MED ORDER — CLINDAMYCIN PHOSPHATE 900 MG/50ML IV SOLN
INTRAVENOUS | Status: AC
  Filled 2016-03-07: qty 50

## 2016-03-07 MED ORDER — KCL IN DEXTROSE-NACL 20-5-0.45 MEQ/L-%-% IV SOLN
INTRAVENOUS | Status: DC
  Administered 2016-03-07 – 2016-03-08 (×2): via INTRAVENOUS
  Filled 2016-03-07 (×2): qty 1000

## 2016-03-07 MED ORDER — ACETAMINOPHEN 10 MG/ML IV SOLN
INTRAVENOUS | Status: AC
  Filled 2016-03-07: qty 100

## 2016-03-07 MED ORDER — KETOROLAC TROMETHAMINE 30 MG/ML IJ SOLN: 30.0000 mg | Freq: Once | INTRAMUSCULAR | Status: DC

## 2016-03-07 MED ORDER — BUPIVACAINE LIPOSOME 1.3 % IJ SUSP
INTRAMUSCULAR | Status: AC
  Filled 2016-03-07: qty 20

## 2016-03-07 MED ORDER — PROPOFOL 10 MG/ML IV BOLUS
INTRAVENOUS | Status: DC | PRN
  Administered 2016-03-07: 30 mg via INTRAVENOUS
  Administered 2016-03-07: 160 mg via INTRAVENOUS

## 2016-03-07 MED ORDER — LACTATED RINGERS IR SOLN
Status: DC | PRN
  Administered 2016-03-07: 1000 mL

## 2016-03-07 MED ORDER — PREGABALIN 50 MG PO CAPS
50.0000 mg | ORAL_CAPSULE | Freq: Three times a day (TID) | ORAL | Status: DC
  Administered 2016-03-07 – 2016-03-09 (×5): 50 mg via ORAL
  Filled 2016-03-07 (×5): qty 1

## 2016-03-07 MED ORDER — LISINOPRIL 20 MG PO TABS
20.0000 mg | ORAL_TABLET | Freq: Every day | ORAL | Status: DC
  Administered 2016-03-09: 20 mg via ORAL
  Filled 2016-03-07: qty 1

## 2016-03-07 MED ORDER — SODIUM CHLORIDE 0.9 % IV BOLUS (SEPSIS)
1000.0000 mL | Freq: Once | INTRAVENOUS | Status: AC
  Administered 2016-03-07: 1000 mL via INTRAVENOUS

## 2016-03-07 MED ORDER — ACETAMINOPHEN 10 MG/ML IV SOLN
INTRAVENOUS | Status: DC | PRN
  Administered 2016-03-07: 1000 mg via INTRAVENOUS

## 2016-03-07 MED ORDER — PROMETHAZINE HCL 25 MG/ML IJ SOLN: 6.2500 mg | INTRAMUSCULAR | Status: DC | PRN

## 2016-03-07 MED ORDER — SODIUM CHLORIDE 0.9 % IJ SOLN
INTRAMUSCULAR | Status: AC
  Filled 2016-03-07: qty 10

## 2016-03-07 MED ORDER — ROCURONIUM BROMIDE 50 MG/5ML IV SOSY
PREFILLED_SYRINGE | INTRAVENOUS | Status: AC
  Filled 2016-03-07: qty 5

## 2016-03-07 MED ORDER — HYDROMORPHONE HCL 1 MG/ML IJ SOLN
0.2500 mg | INTRAMUSCULAR | Status: DC | PRN
  Administered 2016-03-07 (×2): 0.5 mg via INTRAVENOUS

## 2016-03-07 MED ORDER — DEXAMETHASONE SODIUM PHOSPHATE 10 MG/ML IJ SOLN
INTRAMUSCULAR | Status: DC | PRN
  Administered 2016-03-07: 10 mg via INTRAVENOUS

## 2016-03-07 MED ORDER — CLINDAMYCIN PHOSPHATE 900 MG/50ML IV SOLN
900.0000 mg | INTRAVENOUS | Status: AC
  Administered 2016-03-07: 900 mg via INTRAVENOUS

## 2016-03-07 MED ORDER — HYDROMORPHONE HCL 2 MG/ML IJ SOLN
INTRAMUSCULAR | Status: AC
  Filled 2016-03-07: qty 1

## 2016-03-07 MED ORDER — ONDANSETRON HCL 4 MG/2ML IJ SOLN
INTRAMUSCULAR | Status: DC | PRN
  Administered 2016-03-07: 4 mg via INTRAVENOUS

## 2016-03-07 MED ORDER — HYDROMORPHONE HCL 1 MG/ML IJ SOLN
INTRAMUSCULAR | Status: AC
  Administered 2016-03-07: 0.5 mg via INTRAVENOUS
  Filled 2016-03-07: qty 1

## 2016-03-07 MED ORDER — ACETAMINOPHEN 500 MG PO TABS
1000.0000 mg | ORAL_TABLET | Freq: Four times a day (QID) | ORAL | Status: AC
  Administered 2016-03-07 – 2016-03-08 (×4): 1000 mg via ORAL
  Filled 2016-03-07 (×5): qty 2

## 2016-03-07 MED ORDER — DOCUSATE SODIUM 100 MG PO CAPS
100.0000 mg | ORAL_CAPSULE | Freq: Two times a day (BID) | ORAL | Status: DC
  Administered 2016-03-07 – 2016-03-09 (×4): 100 mg via ORAL
  Filled 2016-03-07 (×4): qty 1

## 2016-03-07 MED ORDER — SUFENTANIL CITRATE 50 MCG/ML IV SOLN
INTRAVENOUS | Status: DC | PRN
  Administered 2016-03-07 (×5): 5 ug via INTRAVENOUS

## 2016-03-07 MED ORDER — SENNA 8.6 MG PO TABS
1.0000 | ORAL_TABLET | Freq: Two times a day (BID) | ORAL | Status: DC
  Administered 2016-03-07 – 2016-03-09 (×4): 8.6 mg via ORAL
  Filled 2016-03-07 (×4): qty 1

## 2016-03-07 MED ORDER — SUFENTANIL CITRATE 50 MCG/ML IV SOLN
INTRAVENOUS | Status: AC
  Filled 2016-03-07: qty 1

## 2016-03-07 MED ORDER — VENLAFAXINE HCL ER 37.5 MG PO CP24
37.5000 mg | ORAL_CAPSULE | Freq: Every day | ORAL | Status: DC
  Administered 2016-03-08 – 2016-03-09 (×2): 37.5 mg via ORAL
  Filled 2016-03-07 (×2): qty 1

## 2016-03-07 MED ORDER — SUGAMMADEX SODIUM 200 MG/2ML IV SOLN
INTRAVENOUS | Status: DC | PRN
  Administered 2016-03-07: 200 mg via INTRAVENOUS

## 2016-03-07 MED ORDER — OXYCODONE HCL 5 MG PO TABS
5.0000 mg | ORAL_TABLET | ORAL | Status: DC | PRN
  Administered 2016-03-09: 5 mg via ORAL
  Filled 2016-03-07 (×2): qty 1

## 2016-03-07 MED ORDER — LIDOCAINE 2% (20 MG/ML) 5 ML SYRINGE
INTRAMUSCULAR | Status: AC
Start: 2016-03-07 — End: 2016-03-07
  Filled 2016-03-07: qty 5

## 2016-03-07 MED ORDER — DEXAMETHASONE SODIUM PHOSPHATE 10 MG/ML IJ SOLN
INTRAMUSCULAR | Status: AC
  Filled 2016-03-07: qty 1

## 2016-03-07 MED ORDER — MIDAZOLAM HCL 5 MG/5ML IJ SOLN
INTRAMUSCULAR | Status: DC | PRN
  Administered 2016-03-07: 1 mg via INTRAVENOUS

## 2016-03-07 MED ORDER — MEPERIDINE HCL 50 MG/ML IJ SOLN: 6.2500 mg | INTRAMUSCULAR | Status: DC | PRN

## 2016-03-07 SURGICAL SUPPLY — 62 items
ADH SKN CLS APL DERMABOND .7 (GAUZE/BANDAGES/DRESSINGS) ×1
APPLICATOR COTTON TIP 6IN STRL (MISCELLANEOUS) ×2 IMPLANT
CATH FOLEY 2WAY SLVR 18FR 30CC (CATHETERS) ×2 IMPLANT
CATH HEMA 3WAY 30CC 22FR COUDE (CATHETERS) ×1 IMPLANT
CATH TIEMANN FOLEY 18FR 5CC (CATHETERS) ×1 IMPLANT
CHLORAPREP W/TINT 26ML (MISCELLANEOUS) ×2 IMPLANT
CLIP LIGATING HEM O LOK PURPLE (MISCELLANEOUS) IMPLANT
CLOTH BEACON ORANGE TIMEOUT ST (SAFETY) ×2 IMPLANT
COVER SURGICAL LIGHT HANDLE (MISCELLANEOUS) ×2 IMPLANT
COVER TIP SHEARS 8 DVNC (MISCELLANEOUS) ×1 IMPLANT
COVER TIP SHEARS 8MM DA VINCI (MISCELLANEOUS) ×1
CUTTER ECHEON FLEX ENDO 45 340 (ENDOMECHANICALS) IMPLANT
DECANTER SPIKE VIAL GLASS SM (MISCELLANEOUS) ×2 IMPLANT
DERMABOND ADVANCED (GAUZE/BANDAGES/DRESSINGS) ×1
DERMABOND ADVANCED .7 DNX12 (GAUZE/BANDAGES/DRESSINGS) ×1 IMPLANT
DRAPE ARM DVNC X/XI (DISPOSABLE) ×4 IMPLANT
DRAPE COLUMN DVNC XI (DISPOSABLE) ×1 IMPLANT
DRAPE DA VINCI XI ARM (DISPOSABLE) ×4
DRAPE DA VINCI XI COLUMN (DISPOSABLE) ×1
DRSG TEGADERM 4X4.75 (GAUZE/BANDAGES/DRESSINGS) ×4 IMPLANT
ELECT REM PT RETURN 9FT ADLT (ELECTROSURGICAL) ×2
ELECTRODE REM PT RTRN 9FT ADLT (ELECTROSURGICAL) ×1 IMPLANT
GAUZE SPONGE 2X2 8PLY STRL LF (GAUZE/BANDAGES/DRESSINGS) ×1 IMPLANT
GLOVE BIO SURGEON STRL SZ 6.5 (GLOVE) ×2 IMPLANT
GLOVE BIOGEL M STRL SZ7.5 (GLOVE) ×4 IMPLANT
GLOVE BIOGEL PI IND STRL 7.5 (GLOVE) ×1 IMPLANT
GLOVE BIOGEL PI INDICATOR 7.5 (GLOVE) ×1
GOWN STRL REUS W/TWL LRG LVL3 (GOWN DISPOSABLE) ×4 IMPLANT
GOWN STRL REUS W/TWL LRG LVL4 (GOWN DISPOSABLE) ×6 IMPLANT
GUIDEWIRE STR DUAL SENSOR (WIRE) ×1 IMPLANT
HOLDER FOLEY CATH W/STRAP (MISCELLANEOUS) ×2 IMPLANT
IRRIG SUCT STRYKERFLOW 2 WTIP (MISCELLANEOUS) ×2
IRRIGATION SUCT STRKRFLW 2 WTP (MISCELLANEOUS) ×1 IMPLANT
IV LACTATED RINGERS 1000ML (IV SOLUTION) ×2 IMPLANT
NDL INSUFFLATION 14GA 120MM (NEEDLE) ×1 IMPLANT
NEEDLE INSUFFLATION 14GA 120MM (NEEDLE) ×2 IMPLANT
PACK ROBOT UROLOGY CUSTOM (CUSTOM PROCEDURE TRAY) ×2 IMPLANT
PAD POSITIONING PINK XL (MISCELLANEOUS) ×1 IMPLANT
PORT ACCESS TROCAR AIRSEAL 12 (TROCAR) ×1 IMPLANT
PORT ACCESS TROCAR AIRSEAL 5M (TROCAR) ×1
RELOAD GREEN ECHELON 45 (STAPLE) ×1 IMPLANT
SEAL CANN UNIV 5-8 DVNC XI (MISCELLANEOUS) ×4 IMPLANT
SEAL XI 5MM-8MM UNIVERSAL (MISCELLANEOUS) ×4
SET TRI-LUMEN FLTR TB AIRSEAL (TUBING) ×2 IMPLANT
SHEET LAVH (DRAPES) IMPLANT
SOLUTION ELECTROLUBE (MISCELLANEOUS) ×2 IMPLANT
SPONGE GAUZE 2X2 STER 10/PKG (GAUZE/BANDAGES/DRESSINGS) ×1
SPONGE LAP 4X18 X RAY DECT (DISPOSABLE) ×2 IMPLANT
SUT ETHILON 3 0 PS 1 (SUTURE) ×2 IMPLANT
SUT MNCRL AB 4-0 PS2 18 (SUTURE) ×4 IMPLANT
SUT PDS AB 1 CT1 27 (SUTURE) ×2 IMPLANT
SUT PROLENE 0 CT 1 30 (SUTURE) ×1 IMPLANT
SUT VIC AB 0 CT1 36 (SUTURE) ×3 IMPLANT
SUT VIC AB 2-0 SH 27 (SUTURE) ×2
SUT VIC AB 2-0 SH 27X BRD (SUTURE) ×1 IMPLANT
SUT VICRYL 0 UR6 27IN ABS (SUTURE) ×2 IMPLANT
SUT VLOC BARB 180 ABS3/0GR12 (SUTURE)
SUTURE VLOC BRB 180 ABS3/0GR12 (SUTURE) IMPLANT
TOWEL OR 17X26 10 PK STRL BLUE (TOWEL DISPOSABLE) ×2 IMPLANT
TOWEL OR NON WOVEN STRL DISP B (DISPOSABLE) ×2 IMPLANT
TUBING TUR DISP (UROLOGICAL SUPPLIES) ×1 IMPLANT
WATER STERILE IRR 1500ML POUR (IV SOLUTION) ×4 IMPLANT

## 2016-03-07 NOTE — Brief Op Note (Signed)
03/07/2016  5:22 PM  PATIENT:  Tyler Lester  71 y.o. 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, UMBILICAL HERNIA  POST-OPERATIVE DIAGNOSIS:  urinary retention in massive prostate, umbilical hernia, bulbar urethral injury  PROCEDURE:  Procedure(s): XI ROBOTIC ASSISTED SIMPLE PROSTATECTOMY AND UMBILICAL HERNIA REPAIR flexible cystoscopy (N/A)  SURGEON:  Surgeon(s) and Role:    * Alexis Frock, MD - Primary  PHYSICIAN ASSISTANT:   ASSISTANTS: Debbrah Alar PA   ANESTHESIA:   local and general  EBL:  Total I/O In: 1000 [I.V.:1000] Out: 200 [Blood:200]  BLOOD ADMINISTERED:none  DRAINS: 1 - JP to bulb, 2 - Foley to gravity   LOCAL MEDICATIONS USED:  MARCAINE     SPECIMEN:  Source of Specimen:  1 - prostate adenoma, 2 - peri-prostatic fat  DISPOSITION OF SPECIMEN:  PATHOLOGY  COUNTS:  YES  TOURNIQUET:  * No tourniquets in log *  DICTATION: .Other Dictation: Dictation Number [id number]  PLAN OF CARE: Admit to inpatient   PATIENT DISPOSITION:  PACU - hemodynamically stable.   Delay start of Pharmacological VTE agent (>24hrs) due to surgical blood loss or risk of bleeding: yes

## 2016-03-07 NOTE — Transfer of Care (Signed)
Immediate Anesthesia Transfer of Care Note  Patient: Tyler Lester  Procedure(s) Performed: Procedure(s): XI ROBOTIC ASSISTED SIMPLE PROSTATECTOMY AND UMBILICAL HERNIA REPAIR flexible cystoscopy (N/A)  Patient Location: PACU  Anesthesia Type:General  Level of Consciousness: awake, alert , oriented and patient cooperative  Airway & Oxygen Therapy: Patient Spontanous Breathing and Patient connected to face mask oxygen  Post-op Assessment: Report given to RN, Post -op Vital signs reviewed and stable and Patient moving all extremities X 4  Post vital signs: stable  Last Vitals:  Vitals:   03/07/16 1137  BP: (!) 144/74  Pulse: 86  Resp: 18  Temp: 37.2 C    Last Pain:  Vitals:   03/07/16 1137  TempSrc: Oral      Patients Stated Pain Goal: 4 ([phone] [id number])  Complications: No apparent anesthesia complications

## 2016-03-07 NOTE — Anesthesia Preprocedure Evaluation (Signed)
Anesthesia Evaluation  Patient identified by MRN, date of birth, ID band Patient awake    Reviewed: Allergy & Precautions, H&P , NPO status , Patient's Chart, lab work & pertinent test results  Airway Mallampati: II  TM Distance: >3 FB Neck ROM: full    Dental no notable dental hx. (+) Edentulous Upper   Pulmonary    Pulmonary exam normal        Cardiovascular Exercise Tolerance: Good hypertension, Normal cardiovascular exam     Neuro/Psych negative psych ROS   GI/Hepatic Neg liver ROS,   Endo/Other  negative endocrine ROSdiabetes  Renal/GU   negative genitourinary   Musculoskeletal   Abdominal (+) + obese,   Peds  Hematology negative hematology ROS (+)   Anesthesia Other Findings   Reproductive/Obstetrics negative OB ROS                             Anesthesia Physical Anesthesia Plan  ASA: II  Anesthesia Plan: General   Post-op Pain Management:    Induction: Intravenous  Airway Management Planned: Oral ETT  Additional Equipment:   Intra-op Plan:   Post-operative Plan: Extubation in OR  Informed Consent: I have reviewed the patients History and Physical, chart, labs and discussed the procedure including the risks, benefits and alternatives for the proposed anesthesia with the patient or authorized representative who has indicated his/her understanding and acceptance.   Dental Advisory Given  Plan Discussed with: CRNA and Surgeon  Anesthesia Plan Comments:         Anesthesia Quick Evaluation

## 2016-03-07 NOTE — Discharge Summary (Signed)
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 Briefly, Tyler Lester is a 71 y.o. gentleman with BPH refractory to medication and umbilical hernia.  After discussing management/treatment options, he elected to proceed with surgical treatment.  Hospital Course: Tyler Lester was taken to the operating room on 03/07/2016 and underwent a robotic assisted laparoscopic simple prostatectomy, umbilical hernia repair, and cystoscopy. He tolerated this procedure well and without complications. Postoperatively, he was able to be transferred to a regular hospital room following recovery from anesthesia.  He was able to begin ambulating the night of surgery. He remained hemodynamically stable overnight.  He had excellent urine output with appropriately minimal output from his pelvic drain and his pelvic drain was removed on POD #2.  He was transitioned to oral pain medication, tolerated a regular liquid diet, and had met all discharge criteria and was able to be discharged home later on POD#2.  Laboratory values:   Recent Labs  03/05/16 1200 03/07/16 1805 03/08/16 0545  HGB 13.5 11.9* 11.8*  HCT 40.9 36.4* 37.1*    EXAM: NAD, resting tremor as per baseline, AOx3 Non-labored breathing on room air Regular heart rate SNTND abdomen, JP removed and dry dressing place.  Foley c/d/i with tea colored urine without clots. Some scant old peri-urethral as anticipated No c/c/e  Disposition: Home  Discharge instruction: He was instructed to be ambulatory but to refrain from heavy lifting, strenuous activity, or driving. He was instructed on urethral catheter care.   Followup: as previously scheduled.

## 2016-03-07 NOTE — Anesthesia Procedure Notes (Addendum)
Procedure Name: Intubation Date/Time: 03/07/2016 1:24 PM Performed by: Lissa Morales Pre-anesthesia Checklist: Patient identified, Emergency Drugs available, Suction available and Patient being monitored Patient Re-evaluated:Patient Re-evaluated prior to inductionOxygen Delivery Method: Circle system utilized Preoxygenation: Pre-oxygenation with 100% oxygen Intubation Type: IV induction Ventilation: Mask ventilation without difficulty Laryngoscope Size: Mac and 4 Grade View: Grade III Tube type: Oral Tube size: 8.0 mm Number of attempts: 1 Airway Equipment and Method: Stylet,  Oral airway and Video-laryngoscopy Placement Confirmation: ETT inserted through vocal cords under direct vision,  positive ETCO2 and breath sounds checked- equal and bilateral Secured at: 23 cm Tube secured with: Tape Dental Injury: Teeth and Oropharynx as per pre-operative assessment  Difficulty Due To: Difficult Airway- due to anterior larynx, Difficult Airway- due to limited oral opening and Difficult Airway- due to reduced neck mobility Comments: Elective Glidescope secondary TO  DECREASED MOBILITY OF NECK,  3 LEVEL FUSION Map 1 with glidescpe.

## 2016-03-07 NOTE — Anesthesia Postprocedure Evaluation (Signed)
Anesthesia Post Note  Patient: Tyler Lester  Procedure(s) Performed: Procedure(s) (LRB): XI ROBOTIC ASSISTED SIMPLE PROSTATECTOMY AND UMBILICAL HERNIA REPAIR flexible cystoscopy (N/A)  Patient location during evaluation: PACU Anesthesia Type: General Level of consciousness: sedated and patient cooperative Pain management: pain level controlled Vital Signs Assessment: post-procedure vital signs reviewed and stable Respiratory status: spontaneous breathing Cardiovascular status: stable Anesthetic complications: no       Last Vitals:  Vitals:   03/07/16 1745 03/07/16 1800  BP: 129/76 140/86  Pulse: 73 70  Resp: 14 14  Temp:      Last Pain:  Vitals:   03/07/16 1800  TempSrc:   PainSc: [place]

## 2016-03-07 NOTE — Progress Notes (Signed)
Reviewed and completed an order for supplies for incontinence from health Department . Dr. Ardelia Mems signed the form.  The form was placed in outgoing fax basket.  Mercy Riding, MD

## 2016-03-07 NOTE — H&P (Signed)
Tyler Lester is an 71 y.o. male.    Chief Complaint: Pre-op Robotic Simple Prostatectomy  HPI:   1 - BPH with Refracotry Urinary Retention - 140gm prostate by TRUS 2014. Estimate 287m by CT 2016. UDS pre medical therapy obstructed, normal capacity. Started on tamsulosin + finasteride and passed trial of void 2016. Has had few interval episdoes of recurent retention when ran out of meds. Now on finasteride + tamsulosin BID and with recurrent retetion and catheter dependant.    2 -Elevated PSA - No known [facility] prostate cancer.    PSA has been as follows.   01/2012 - 14.63   09/2012 - 15.3-> NEGATIVE prostate biopsy   05/2015 - PSA 8.79 DRE 100gm+ smooth.     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 His PCP is Tyler PocheMD.    Today Tyler Lester seen to proceed with robotic simple prostatectomy. No interval fevers.    Past Medical History:  Diagnosis Date  . Bilateral foot pain   . Chronic pain   . Diabetes mellitus    type 2, diet controlled now  . Foley catheter in place since nov 2018, last changed 1 week ago  . Hyperlipidemia   . Hypertension   . Inguinal hernia    right  . Numbness    T/O  . Parkinson's disease ([facility]   . Pneumonia yrs ago  . Shortness of breath    with exertion  . Urinary retention     Past Surgical History:  Procedure Laterality Date  . CATARACT EXTRACTION W/ INTRAOCULAR LENS IMPLANT Left 06/2012   laser for posterior capsule?  . COLON SURGERY    . POSTERIOR CERVICAL FUSION/FORAMINOTOMY N/A 11/12/2013   Procedure: Posterior cervical decompression fusion, cervical 3-4, cervical 4-5, cervical 5-6, cervical 6-7 with instrumentation and allograft   (LEVEL 4);  Surgeon: MSinclair Ship MD;  [place] [facility]  Service: Orthopedics;  Laterality: N/A;  Posterior cervical decompression fusion, cervical 3-4, cervical 4-5, cervical 5-6, cervical  6-7 with instrumentation and allograft  . TONSILLECTOMY      Family History  Problem Relation Age of Onset  . Cancer - Other Other   . Diabetes Other   . Hypertension Other    Social History:  reports that he has never smoked. He has never used smokeless tobacco. He reports that he drinks alcohol. He reports that he does not use drugs.  Allergies:  Allergies  Allergen Reactions  . Poison Ivy Treatments Hives    No prescriptions prior to admission.    Results for orders placed or performed during the hospital encounter of 03/05/16 (from the past 48 hour(s))  Glucose, capillary     Status: Abnormal   Collection Time: 03/05/16 11:10 AM  Result Value Ref Range   Glucose-Capillary 137 (H) 65 - 99 mg/dL  Basic metabolic panel     Status: Abnormal   Collection Time: 03/05/16 12:00 PM  Result Value Ref Range   Sodium 139 135 - 145 mmol/L   Potassium 3.6 3.5 - 5.1 mmol/L   Chloride 105 101 - 111 mmol/L   CO2 26 22 - 32 mmol/L   Glucose, Bld 142 (H) 65 - 99 mg/dL   BUN 30 (H) 6 - 20 mg/dL   Creatinine, Ser 1.35 (H) 0.61 - 1.24 mg/dL   Calcium 9.0 8.9 - 10.3 mg/dL   GFR calc non Af Amer 52 (L) >60 mL/min  GFR calc Af Amer >60 >60 mL/min    Comment: (NOTE) The eGFR has been calculated using the CKD EPI equation. This calculation has not been validated in all clinical situations. eGFR's persistently <60 mL/min signify possible Chronic Kidney Disease.    Anion gap 8 5 - 15  CBC     Status: None   Collection Time: 03/05/16 12:00 PM  Result Value Ref Range   WBC 9.2 4.0 - 10.5 K/uL   RBC 4.70 4.22 - 5.81 MIL/uL   Hemoglobin 13.5 13.0 - 17.0 g/dL   HCT 40.9 39.0 - 52.0 %   MCV 87.0 78.0 - 100.0 fL   MCH 28.7 26.0 - 34.0 pg   MCHC 33.0 30.0 - 36.0 g/dL   RDW 13.5 11.5 - 15.5 %   Platelets 173 150 - 400 K/uL  Type and screen All Cardiac and thoracic surgeries, spinal fusions, myomectomies, craniotomies, colon & liver resections, total joint revisions, same day c-section with  placenta previa or accreta.     Status: None   Collection Time: 03/05/16 12:00 PM  Result Value Ref Range   ABO/RH(D) B POS    Antibody Screen NEG    Sample Expiration 03/19/2016    Extend sample reason NO TRANSFUSIONS OR PREGNANCY IN THE PAST 3 MONTHS   Hemoglobin A1c     Status: Abnormal   Collection Time: 03/05/16 12:00 PM  Result Value Ref Range   Hgb A1c MFr Bld 7.3 (H) 4.8 - 5.6 %    Comment: (NOTE)         Pre-diabetes: 5.7 - 6.4         Diabetes: >6.4         Glycemic control for adults with diabetes: <7.0    Mean Plasma Glucose 163 mg/dL    Comment: (NOTE) Performed At: [facility] [place], [place] [id number] Lindon Romp MD QB:[id number]   ABO/Rh     Status: None   Collection Time: 03/05/16 12:00 PM  Result Value Ref Range   ABO/RH(D) B POS    No results found.  Review of Systems  Constitutional: Negative.  Negative for fever.  HENT: Negative.   Eyes: Negative.   Respiratory: Negative.   Cardiovascular: Negative.   Gastrointestinal: Negative.   Genitourinary: Negative for flank pain.  Musculoskeletal: Negative.   Skin: Negative.   Neurological: Negative.   Endo/Heme/Allergies: Negative.   Psychiatric/Behavioral: Negative.     There were no vitals taken for this visit. Physical Exam  Constitutional: He is oriented to person, place, and time. He appears well-developed.  HENT:  Head: Normocephalic.  Eyes: Pupils are equal, round, and reactive to light.  Neck: Normal range of motion.  Cardiovascular: Normal rate.   Respiratory: Effort normal.  GI:  Reducible umbilical hernia  Genitourinary:  Genitourinary Comments: No CVAT. Foley in situe with yellow urine.   Musculoskeletal: Normal range of motion.  Neurological: He is alert and oriented to person, place, and time.  Stigmata of parkinsonism, at baseline.   Skin: Skin is warm.  Psychiatric: He has a normal mood and affect. His behavior is normal. Judgment and thought  content normal.     Assessment/Plan  Proceed as planned with robotic simple prostatectomy and umbilical hernia repair. Risks, benefits, alternatives, expected peri-op course discussed previously and reiterated today.   Tyler Frock, MD 03/07/2016, 6:17 AM

## 2016-03-07 NOTE — Discharge Instructions (Signed)

## 2016-03-08 ENCOUNTER — Encounter (HOSPITAL_COMMUNITY): Payer: Self-pay | Admitting: Urology

## 2016-03-08 LAB — HEMOGLOBIN AND HEMATOCRIT, BLOOD
HCT: 37.1 % — ABNORMAL LOW (ref 39.0–52.0)
Hemoglobin: 11.8 g/dL — ABNORMAL LOW (ref 13.0–17.0)

## 2016-03-08 LAB — BASIC METABOLIC PANEL
Anion gap: 5 (ref 5–15)
BUN: 24 mg/dL — ABNORMAL HIGH (ref 6–20)
CO2: 24 mmol/L (ref 22–32)
Calcium: 7.6 mg/dL — ABNORMAL LOW (ref 8.9–10.3)
Chloride: 104 mmol/L (ref 101–111)
Creatinine, Ser: 1.58 mg/dL — ABNORMAL HIGH (ref 0.61–1.24)
GFR calc Af Amer: 50 mL/min — ABNORMAL LOW (ref >60)
GFR calc non Af Amer: 43 mL/min — ABNORMAL LOW (ref >60)
Glucose, Bld: 172 mg/dL — ABNORMAL HIGH (ref 65–99)
Potassium: 4.3 mmol/L (ref 3.5–5.1)
Sodium: 133 mmol/L — ABNORMAL LOW (ref 135–145)

## 2016-03-08 MED ORDER — SODIUM CHLORIDE 0.9 % IV SOLN: 250.0000 mL | INTRAVENOUS | Status: DC | PRN

## 2016-03-08 MED ORDER — SODIUM CHLORIDE 0.9% FLUSH: 3.0000 mL | INTRAVENOUS | Status: DC | PRN

## 2016-03-08 MED ORDER — SODIUM CHLORIDE 0.9% FLUSH
3.0000 mL | Freq: Two times a day (BID) | INTRAVENOUS | Status: DC
  Administered 2016-03-08 (×2): 3 mL via INTRAVENOUS

## 2016-03-08 NOTE — Progress Notes (Signed)
Offered ambulation/dangling to patient, stated he just wanted to "rest" for the night, and would try in the morning if possible. Will continue to encourage him to ambulate.

## 2016-03-08 NOTE — Progress Notes (Signed)
1 Day Post-Op Subjective: The patient is doing well.  No nausea or vomiting. Pain is adequately controlled. Catheter drained well overnight without flushing needed. Has not ambulated. Hb stable at 11.8 from 11.9. Creatinine up to 1.58 from 1.35. JP with 196ml out  Objective: Vital signs in last 24 hours: Temp:  [97.4 F (36.3 C)-98.9 F (37.2 C)] 98.9 F (37.2 C) (01/11 0459) Pulse Rate:  [69-86] 71 (01/11 0459) Resp:  [11-18] 14 (01/11 0459) BP: (95-144)/(55-89) 95/55 (01/11 0459) SpO2:  [98 %-100 %] 100 % (01/11 0459) Weight:  [95.3 kg (210 lb)] 95.3 kg (210 lb) (01/10 1137)  Intake/Output from previous day: 01/10 0701 - 01/11 0700 In: 4083.8 [P.O.:240; I.V.:2843.8; IV Piggyback:1000] Out: [id number] [Urine:525; Drains:150; Blood:200] Intake/Output this shift: Total I/O In: 683.8 [P.O.:240; I.V.:443.8] Out: 615 [Urine:525; Drains:90]  Physical Exam:  General: Alert and oriented. CV: RRR Lungs: Clear bilaterally. GI: Soft, Nondistended. Incisions: Clean, dry, and intact Urine: Light pink colored urine, flushes to clear. JP with amber colored fluid. Extremities: Nontender, no erythema, no edema.  Lab Results:  Recent Labs  03/05/16 1200 03/07/16 1805 03/08/16 0545  HGB 13.5 11.9* 11.8*  HCT 40.9 36.4* 37.1*      Assessment/Plan: POD# 1 s/p robotic simple prostatectomy.  1) Advance to regular diet 2) Ambulate, Incentive spirometry 3) Transition to oral pain medication 4) Dulcolax suppository 5) Plan for likely discharge later today vs tomorrow   LOS: 1 day   Lolita Rieger 03/08/2016, 7:00 AM   I have seen and examined the patient and agree with above.  Briefly,  S: POD 1 s/p robotic simple prostatectomy / umbilical hernia repair / cysto. NO complaints. Pain controlled.  O: NAD, AOx3 Resting tremor as per baseline SNTND, LLQ JP with serosanguinous fluid that is non-foul Foley c/d/i to straight drain. Some bleeding aroung as expected. Urine clearing. SCD's  in place, no c/c/e  A/P Doing well POD 1. Likely DC tomorrow based on current progress.

## 2016-03-09 LAB — CREATININE, FLUID (PLEURAL, PERITONEAL, JP DRAINAGE): Creat, Fluid: 1.7 mg/dL

## 2016-03-09 MED ORDER — SENNOSIDES-DOCUSATE SODIUM 8.6-50 MG PO TABS: 1.0000 | ORAL_TABLET | Freq: Two times a day (BID) | ORAL | 0 refills | Status: DC

## 2016-03-09 NOTE — Progress Notes (Signed)
Patient is stable for discharge. Discharge instructions and medications have been reviewed with the patient and all questions answered. Reviewed incision care and foley/leg bag teaching with patient. Patient demonstrates understanding.

## 2016-03-09 NOTE — Op Note (Signed)
NAME:  Tyler Lester                    ACCOUNT NO.:  MEDICAL RECORD NO.:  [id number]  LOCATION:                                 FACILITY:  PHYSICIAN:  Alexis Frock, MD     DATE OF BIRTH:  17-Dec-1945  DATE OF PROCEDURE:  03/07/2016                              OPERATIVE REPORT   PREOPERATIVE DIAGNOSES: 1. Massive prostatic hypertrophy with recurrent urinary retention. 2. Umbilical hernia.  POSTOPERATIVE DIAGNOSES: 1. Massive prostatic hypertrophy with recurrent urinary retention. 2. Umbilical hernia. 3. Bulbar urethral injury.  PROCEDURE: 1. Robotic assisted laparoscopic simple prostatectomy. 2. Open umbilical hernia repair. 3. Cystoscopy with catheter placement, complicated.  ESTIMATED BLOOD LOSS:  200 mL.  ASSISTANT: Debbrah Alar, PA  COMPLICATIONS:  Recognized bulbar urethral injury.  DRAINS: 1. A 22-French 3-way hematuria catheter to gravity drainage,     irrigation port plugged. 2. Jackson-Pratt drain to bulb suction.  INDICATIONS:  Tyler Lester is a pleasant 71 year old gentleman with history of advanced Parkinson's disease, with remarkably good quality of life and functional status.  He has been catheter dependent for some time due to refractory prostatic hypertrophy.  He is on maximal medical therapy with 5 alpha reductase inhibitors and alpha blockers, but is recurrently in urinary retention.  He is known to have an adequately contractile bladder.  Options were discussed including chronic catheters versus attempted endoscopic management versus more definitive therapy with simple prostatectomy and he adamantly wished to proceed with the latter.  The patient also with umbilical hernia that is reducible, that he also desires concomitant repair of.  Informed consent was obtained and placed in the medical record.  PROCEDURE IN DETAIL:  The patient being Tyler Lester was verified. Procedure being robotic simple prostatectomy with umbilical hernia repair was  confirmed.  Procedure was carried out.  Time-out was performed.  Intravenous antibiotics were administered.  General anesthesia introduced.  The patient was placed into a low lithotomy position.  Sterile field was created by prepping and draping the patient's penis, perineum, proximal thighs using iodine x3 after his in- situ catheter was removed and his infraumbilical abdomen using chlorhexidine gluconate.  After clipper shaving, he was further fashioned to the operating table using 3-inch tape over foam padding.  A test of steep Trendelenburg positioning was performed and found to be suitably positioned.  A new Foley catheter was placed per urethra to straight drain.  Next, a high-flow, low-pressure pneumoperitoneum was obtained using Veress technique in the umbilicus directly into the area of reducible hernia defect after ensuring it was reduced.  An 8 mm robotic camera port was placed into the same location.  Laparoscopic examination of the peritoneal cavity revealed no significant adhesions and no visceral injury.  Additional ports were then placed as follows; right paramedian 8-mm robotic port, right far lateral 12-mm AirSeal assistant port, right paramedian 5-mm suction port, left paramedian 8-mm robotic port, left far lateral 8-mm robotic port.  Robot was docked and passed through the electronic checks.  Initial attention was directed at development of space of Retzius.  Incision was made lateral to the left mid-umbilical ligament from the midline towards the area of the internal ring coursing  along the area of vas deferens towards the area of the ureter.  The lateral bladder wall was swept away from the pelvic sidewall towards the area of the endopelvic fascia on the left.  A mirror-imaged dissection was then performed on the right side.  Anterior attachments were taken down with cautery scissors exposing the anterior base of the prostate.  The prostate base junction was  carefully defatted.  The tissue set aside, labeled as periprostatic fat to better identify the bladder neck junction.  Next, the dorsal vein was identified by carefully sweeping away a small amount of the endopelvic fascia just lateral to the puboprostatic ligaments bilaterally dissecting towards the area of the membranous urethra, which was visualized.  A 45 mm stapler was applied across this after ensuring no membranous urethra on the staple line.  This resulted in excellent hemostatic control of that structure.  Next, the area of the bladder neck was identified by moving the Foley catheter back and forth. Incision was made in an inverse semilunar fashion, approximately 60% of the bladder circumference.  There was massive trilobar hypertrophy with majority of the hypertrophy existing from the median lobe as is anticipated.  Four stay sutures were applied, 1 on each lateral lobe and 1 on each side of the median lobe.  Median lobe was then retracted superiorly anteriorly.  Ureteral orifices were carefully identified. The adenoma plane was entered and the posterior plane taking exquisite care to dissect at least 8 mm distal to the trigonal ridge.  The adenoma plane was entered and dissection proceeded within this plane towards the area of the apex of the prostate.  This was carefully developed circumferentially from the 6 o'clock to 12 o'clock position using additional point cautery for excellent hemostasis.  Apical dissection was performed in the anterior plane dissecting along the Foley catheter towards the area of the apex of the of the adenoma, purposely butterflying the adenoma to identify the apex.  Apical attachments were taken down in the anterior plane.  This completely freed up the large adenoma, which was placed in an EndoCatch bag for later retrieval. Posterior mucosal advancement was performed using double-armed V-Loc suture reapproximating the 5 o'clock to the 7 o'clock area  of the membranous urethra, to the 5 o'clock to the 7 o'clock area of the mucosal bladder taking exquisite care to avoid injury to the ureteral orifice, which did not occur.  At this point, all sponge and needle counts were correct.  The cystotomy was closed using a 2-0 V-Loc suture from the 5 o'clock and 7 o'clock positions respectively towards the area of the 12 o'clock position.  The final Foley catheter was then placed, 3- way hematuria type.  However, this did not irrigate quantitatively. Became immediately concerned about possible malposition of this and I very carefully attempted to place a coude type 18-French catheter to verify urethral patency as this had been easily identified multiple times during the posterior mucosal advancement steps, during which the catheter removed quite easily.  At this point, however, the coude catheter was not able to navigate to the bladder reliably.  This was concerning for possible urethral injury during malposition of prior catheter.  As such, the cystotomy was taken down robotically along the prior incision line just at the area of bladder neck.  Prior apical prostate was visualized and this area was completely intact without any violation whatsoever.  An attempt was made to pass a catheter anterograde and robotically towards the pendulous urethra.  However, this also  did not pass easily.  As such, flexible cystoscopy was performed using 16-French flexible cystoscope.  Flexible cystoscopy revealed mucosal disruption in the 6 o'clock aspect of the bulbar urethra, consistent with likely malposition and inadvertent balloon insufflation in the bulbar urethra resulting in injury.  This was able to be navigated across with a cystoscope, which was visualized under robotic vision and over a Sensor wire, a new 22- [place] 3-way hematuria catheter was easily placed under direct vision from the bladder side robotically.  The cystotomy was  then reapproximated again using 2-0 running V-Loss of consciousness x2.  A 30 mL sterile water placed in the balloon catheter and irrigated quite satisfactorily without any leak whatsoever.  At this point, the robot was undocked.  The right lateral most assistant port site was closed at the level of the fascia using Carter-Thomason suture passer.  A close suction drain was brought through the previous left lateral most robotic port site near the peritoneal cavity.  Specimen was retrieved by extending the previous camera port site, which was directly purposely in the area of umbilical hernia defect for distance approximately 3 cm and removing the large adenoma, which was set aside for permanent pathology. The fascial defect did not have to be further developed.  Simple open umbilical hernia repair was then performed using interrupted 2-0 Prolene x4, which resulted in excellent fascial reapproximation.  Scarpa's reapproximated with running Vicryl.  Excess umbilical tissue was tailored.  All incision sites were infiltrated with dilute lyophilized Marcaine and closed at the level of the skin using subcuticular Monocryl followed by Dermabond.  Drain stitch was applied and procedure was terminated.  The patient tolerated the procedure well.  The patient was taken to the postanesthesia care in stable condition.  Please note, surgical assistant, Debbrah Alar, PA, was absolutely crucial for all aspects of the robotic portions today.  She provided invaluable suctioning, traction, bedside assistance without which this would not be possible.          ______________________________ Alexis Frock, MD     TM/MEDQ  D:  03/07/2016  T:  03/08/2016  Job:  [id number]

## 2016-03-09 NOTE — Care Management Note (Signed)
Case Management Note  Patient Details  Name: Tyler Lester MRN: [id number] Date of Birth: 02-Jun-1945  Subjective/Objective:                    Action/Plan:d/c home.   Expected Discharge Date:  03/09/16               Expected Discharge Plan:  Home/Self Care  In-House Referral:     Discharge planning Services  CM Consult  Post Acute Care Choice:    Choice offered to:     DME Arranged:    DME Agency:     HH Arranged:    HH Agency:     Status of Service:  Completed, signed off  If discussed at [employer] of Stay Meetings, dates discussed:    Additional Comments:  Dessa Phi, RN 03/09/2016, 11:54 AM

## 2016-03-16 DIAGNOSIS — G20 Parkinson's disease: Secondary | ICD-10-CM | POA: Diagnosis not present

## 2016-03-23 IMAGING — US US EXTREM LOW*L* LIMITED
1 series · 13 of 13 positions shown · non-contrast
Comparison: None.

CLINICAL DATA: Left supraclavicular mass.

EXAM:
ULTRASOUND left LOWER EXTREMITY LIMITED
TECHNIQUE: Ultrasound examination of the lower extremity soft tissues was
performed in the area of clinical concern.

[Series 1: us extrem low*left* limited · 0.05mm/px · 13 of 13 slices shown]
[im 1/13]
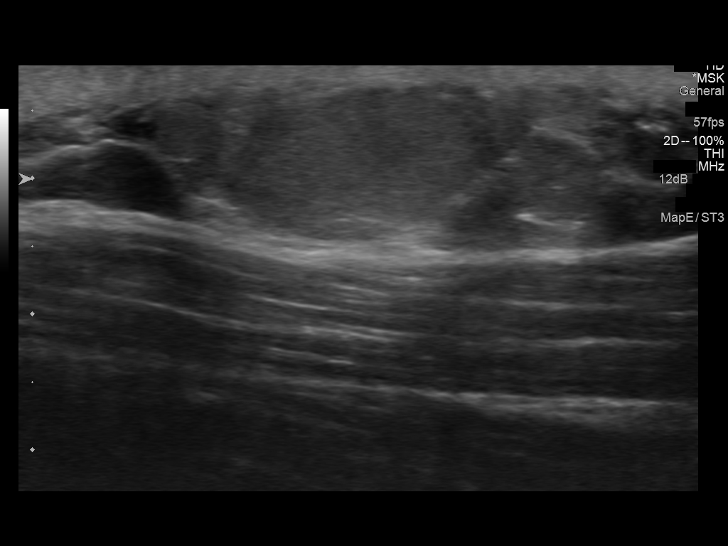
[im 2/13]
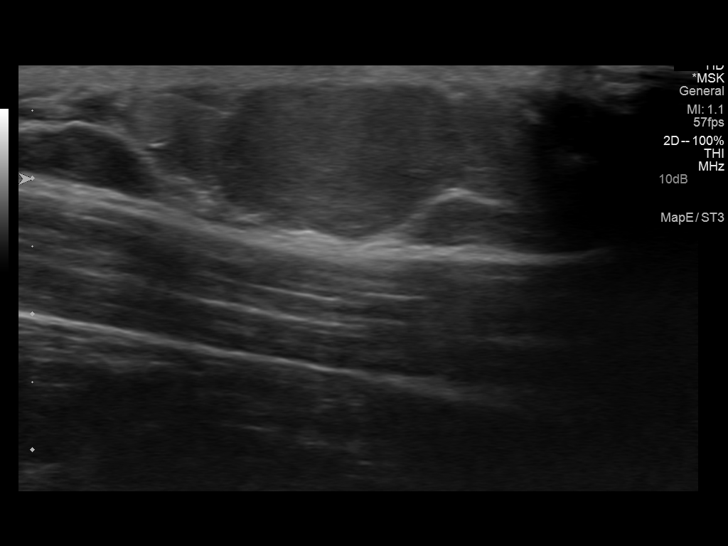
[im 3/13]
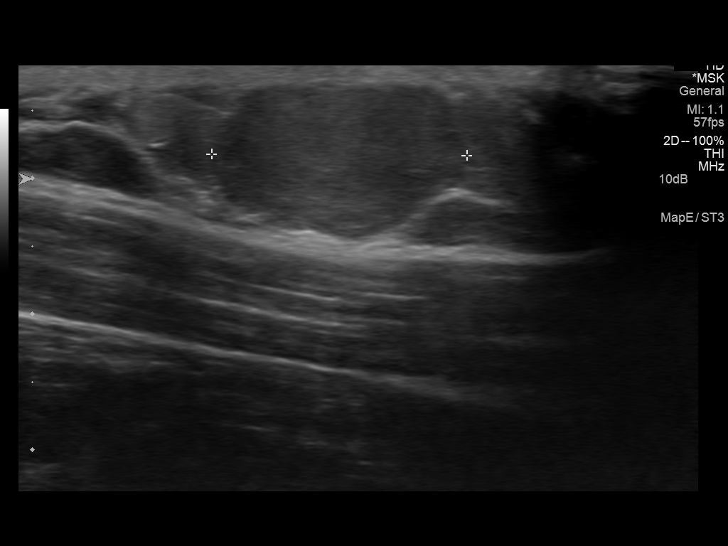
[im 4/13]
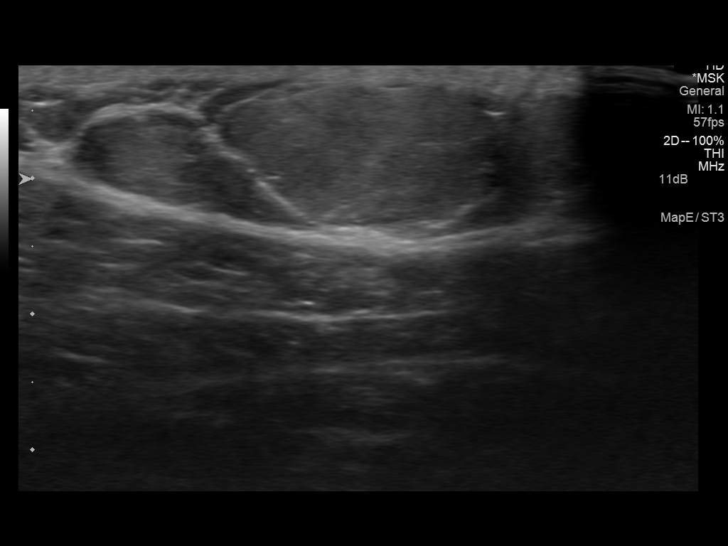
[im 5/13]
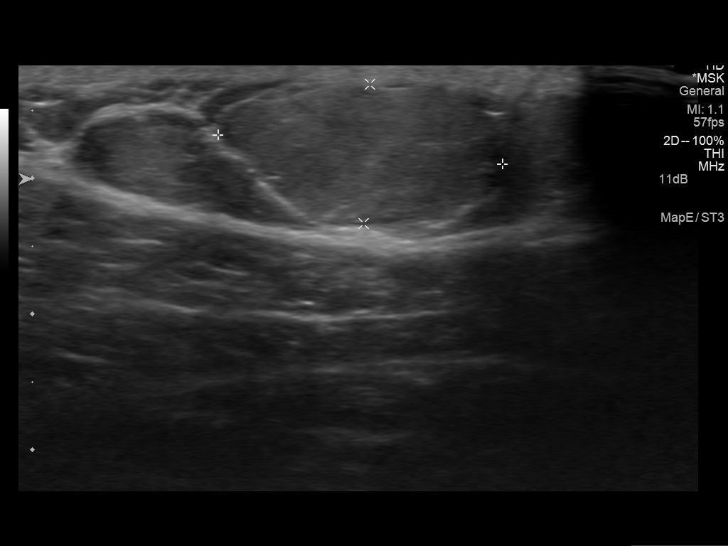
[im 6/13]
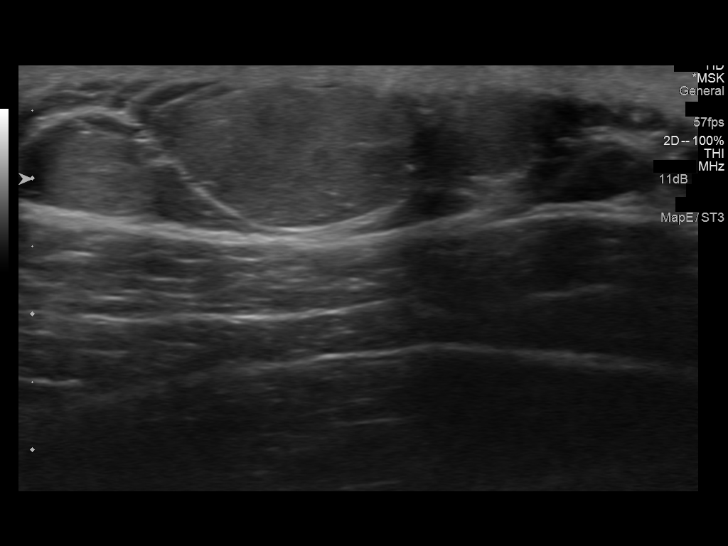
[im 7/13]
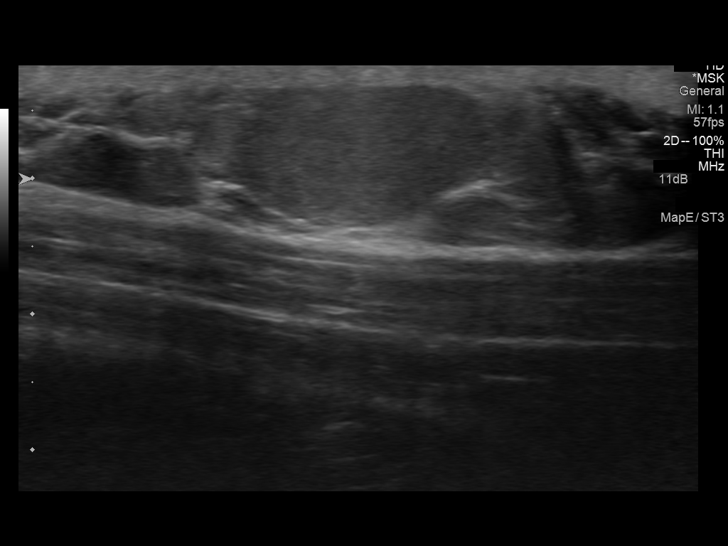
[im 8/13]
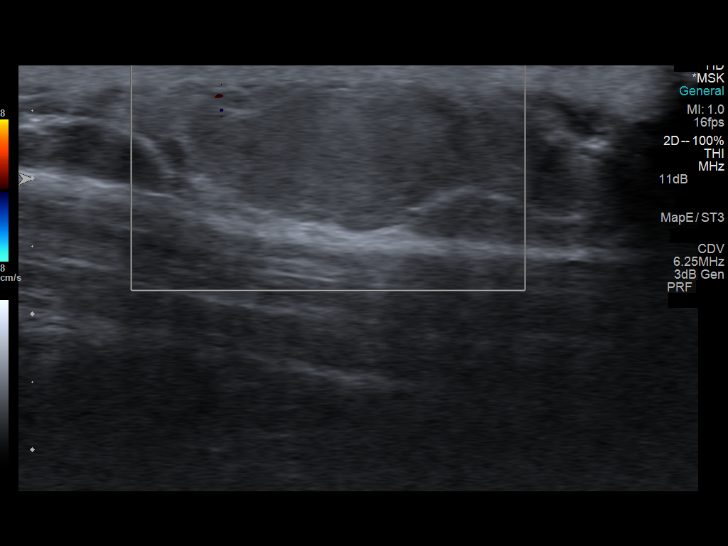
[im 9/13]
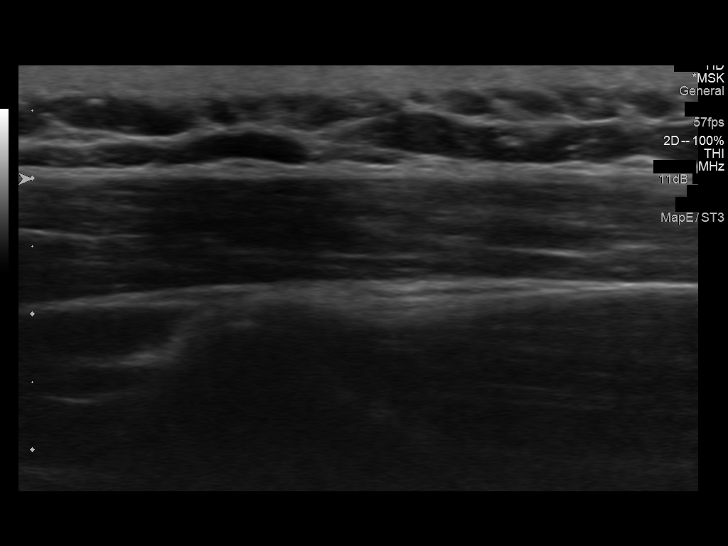
[im 10/13]
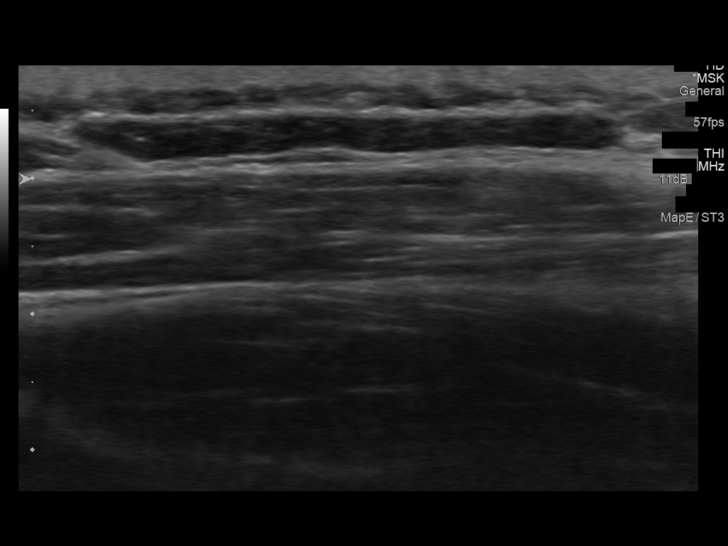
[im 11/13]
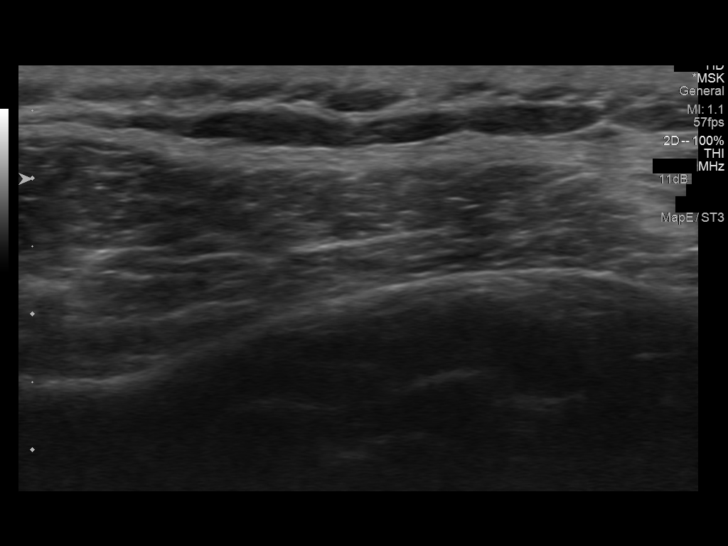
[im 12/13]
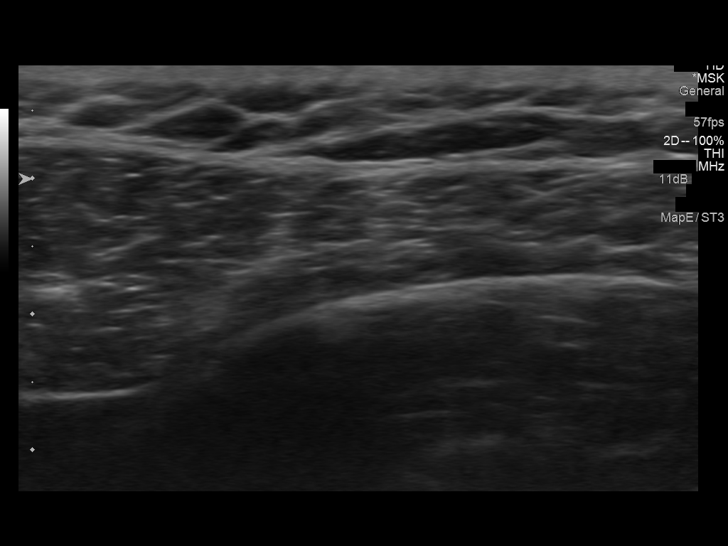
[im 13/13]
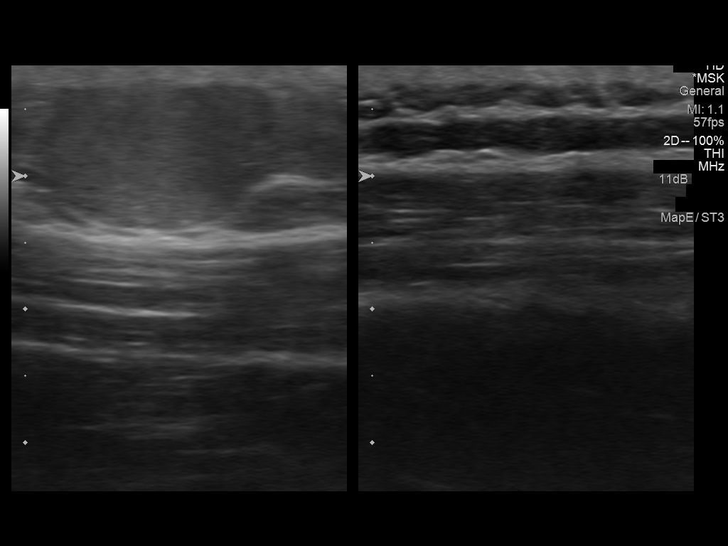

[13 of 13 positions shown; findings below may reference images not displayed]

FINDINGS: There is a heterogeneous subcutaneous soft tissue mass in the area
of the patient's palpable abnormality. This measures approximately
2.1 x 1.0 x 1.9 cm. This could be a lipoma or other solid lesion. No
cystic changes. MRI examination without and with contrast may be
helpful for further evaluation.
IMPRESSION: 2 cm supraclavicular soft tissue mass correlating with the patient's
palpable abnormality. It could be a lipoma or other solid soft
tissue lesion. MRI examination without and with contrast may be
helpful for further evaluation.

## 2016-03-26 DIAGNOSIS — N401 Enlarged prostate with lower urinary tract symptoms: Secondary | ICD-10-CM | POA: Diagnosis not present

## 2016-03-26 DIAGNOSIS — R338 Other retention of urine: Secondary | ICD-10-CM | POA: Diagnosis not present

## 2016-03-27 ENCOUNTER — Encounter: Payer: Self-pay | Admitting: Student

## 2016-03-27 ENCOUNTER — Ambulatory Visit (INDEPENDENT_AMBULATORY_CARE_PROVIDER_SITE_OTHER): Payer: Medicare Other | Admitting: Student

## 2016-03-27 VITALS — BP 136/72 | HR 84 | Temp 97.8°F | Ht 69.0 in | Wt 203.0 lb

## 2016-03-27 DIAGNOSIS — E118 Type 2 diabetes mellitus with unspecified complications: Secondary | ICD-10-CM

## 2016-03-27 MED ORDER — ZOSTER VACCINE LIVE 19400 UNT/0.65ML ~~LOC~~ SUSR: 0.6500 mL | Freq: Once | SUBCUTANEOUS | 0 refills | Status: AC

## 2016-03-27 NOTE — Patient Instructions (Addendum)
It was great seeing you today! We have addressed the following issues today  1. Toe nail: we have trimmed your toenails. please come back and see [place] in three to four months for DM and HTN. 2.         Shingles vaccine: Please take the prescription to the pharmacy to have the shingles vaccine.    If we did any lab work today, and the results require attention, either me or my nurse will get in touch with you. If everything is normal, you will get a letter in mail. If you don't hear from [place] in two weeks, please give [place] a call. Otherwise, we look forward to seeing you again at your next visit. If you have any questions or concerns before then, please call the clinic at [phone].   Please bring all your medications to every doctors visit   Sign up for My Chart to have easy access to your labs results, and communication with your Primary care physician.     Please check-out at the front desk before leaving the clinic.    Take Care,

## 2016-03-27 NOTE — Assessment & Plan Note (Signed)
Well controlled. Recent A1c 7.3 about three weeks ago. A 9 point monofilament exam within normal limits. No skin lesion between his toes. No calluses or broken skin. Enlarged hypertrophic toenails trimmed 10. -Patient to return in 3 months for follow up on diabetes

## 2016-03-27 NOTE — Progress Notes (Signed)
  Subjective:    Tyler Lester is a 71 y.o. old male here for toenail clipping  HPI Toenail clipping: patient with diabetes now presents to the clinic to have his toe nails trimmed. Denies skin lesion in his legs. Denies recent illness or fever. He says he usually get his toe nails trimmed at podiatrist but hasn't been there recently.   PMH/Problem List: has Prediabetes; Essential hypertension; Anxiety; Organic impotence; GERD (gastroesophageal reflux disease); Fatigue; Nail dystrophy; Knee pain, bilateral; Chronic diastolic heart failure (Tyler Lester); Tremor; Cervical spine degeneration; CKD Stage 2; Depression; Myelopathy (Tyler Lester); Bilateral foot pain; Back pain with sciatica; Postlaminectomy syndrome, cervical region; Parkinson's disease (Tyler Lester); Spondylosis, cervical, with myelopathy; Cough; Left arm swelling; Hypokalemia; Shortness of breath; Gait abnormality; Dyspnea; Rhinitis, allergic; Parkinson disease (Tyler Lester); Chronic pain syndrome; Type 2 diabetes mellitus with complication (Tyler Lester); Diminished hearing, left; and BPH (benign prostatic hyperplasia) on his problem list.   has a past medical history of Bilateral foot pain; Chronic pain; Diabetes mellitus; Foley catheter in place (since nov 2018, last changed 1 week ago); Hyperlipidemia; Hypertension; Inguinal hernia; Numbness; Parkinson's disease (Tyler Lester); Pneumonia (yrs ago); Shortness of breath; and Urinary retention.  FH:  Family History  Problem Relation Age of Onset  . Cancer - Other Other   . Diabetes Other   . Hypertension Other     Tyler Lester Social History  Substance Use Topics  . Smoking status: Never Smoker  . Smokeless tobacco: Never Used  . Alcohol use 0.0 oz/week     Comment: 1 shot a day    Immunizations needed: Shingles vaccine  Review of Systems Pertinent positives and negatives per history of present illness.    Objective:     Vitals:   03/27/16 1611  BP: 136/72  Pulse: 84  Temp: 97.8 F (36.6 C)  TempSrc: Oral  Weight: 203 lb (92.1  kg)  Height: 5\' 9"  (1.753 m)    Physical Exam GEN: appears well, no apparent distress. CVS: trace edema,  1+ DP & PT pulses bilaterally RESP: speaks in full sentence, no IWOB MSK: no focal tenderness or notable swelling except for trace edema bilaterally. SKIN: no apparent skin lesion. No skin lesion between the digits. No calluses. Grayish hypertrophic toenails trimmed 10. NEURO: alert and oiented appropriately, no gross deficit. A 9 Lester monofilament exam within normal limits.  PSYCH: euthymic mood with congruent affect     Assessment and Plan:  Type 2 diabetes mellitus with complication (Tyler Lester) Well controlled. Recent A1c 7.3 about three weeks ago. A 9 Lester monofilament exam within normal limits. No skin lesion between his toes. No calluses or broken skin. Enlarged hypertrophic toenails trimmed 10. -Patient to return in 3 months for follow up on diabetes  Onychauxis: Chronic. Could be due to chronic onychomycosis but I doubt about the benefit of treating with antifungal at this Lester given the chronic nature of this. Toenails clipped 10. A 9 Lester monofilament exam within normal limits. No calluses or other skin lesions noted.  Return in about 3 months (around 06/25/2016) for DM and HTN.  Tyler Riding, MD 03/27/16 Pager: [phone]

## 2016-04-11 ENCOUNTER — Encounter: Payer: Self-pay | Admitting: Student

## 2016-04-11 ENCOUNTER — Ambulatory Visit (INDEPENDENT_AMBULATORY_CARE_PROVIDER_SITE_OTHER): Payer: Medicare Other | Admitting: Student

## 2016-04-11 VITALS — BP 140/78 | HR 79 | Temp 97.5°F | Ht 69.0 in | Wt 204.0 lb

## 2016-04-11 DIAGNOSIS — E118 Type 2 diabetes mellitus with unspecified complications: Secondary | ICD-10-CM | POA: Diagnosis not present

## 2016-04-11 DIAGNOSIS — E1142 Type 2 diabetes mellitus with diabetic polyneuropathy: Secondary | ICD-10-CM | POA: Diagnosis not present

## 2016-04-11 MED ORDER — GABAPENTIN 300 MG PO CAPS: 300.0000 mg | ORAL_CAPSULE | Freq: Two times a day (BID) | ORAL | 3 refills | Status: DC

## 2016-04-11 NOTE — Progress Notes (Signed)
  Subjective:    Tyler Lester is a 71 y.o. old male here for leg pain  HPI Leg Pain: from knee down on both side. This has been there for years. No recent change. Describes his pain as sharp. No aggravating factor. He is on Lyrica that helped with the pain but made him sleepy. He asks if we can change his Lyrica to something less sedative. Denies swelling, weakness or fever. Admits some numbness.  He had normal LE arterial doppler except for occluded DP bilaterally. He is not a smoker.  PMH/Problem List: has Prediabetes; Essential hypertension; Anxiety; Organic impotence; GERD (gastroesophageal reflux disease); Fatigue; Nail dystrophy; Knee pain, bilateral; Chronic diastolic heart failure ([place]); Tremor; Cervical spine degeneration; CKD Stage 2; Depression; Myelopathy ([place]); Bilateral foot pain; Back pain with sciatica; Postlaminectomy syndrome, cervical region; Parkinson's disease ([place]); Spondylosis, cervical, with myelopathy; Cough; Left arm swelling; Hypokalemia; Shortness of breath; Gait abnormality; Dyspnea; Rhinitis, allergic; Parkinson disease ([place]); Chronic pain syndrome; Type 2 diabetes mellitus with complication ([place]); Diminished hearing, left; BPH (benign prostatic hyperplasia); and Diabetic polyneuropathy associated with type 2 diabetes mellitus ([place]) on his problem list.   has a past medical history of Bilateral foot pain; Chronic pain; Diabetes mellitus; Foley catheter in place (since nov 2018, last changed 1 week ago); Hyperlipidemia; Hypertension; Inguinal hernia; Numbness; Parkinson's disease ([place]); Pneumonia (yrs ago); Shortness of breath; and Urinary retention.  FH:  Family History  Problem Relation Age of Onset  . Cancer - Other Other   . Diabetes Other   . Hypertension Other     [place] Social History  Substance Use Topics  . Smoking status: Never Smoker  . Smokeless tobacco: Never Used  . Alcohol use 0.0 oz/week     Comment: 1 shot a day    Review of Systems Review of systems  negative except for pertinent positives and negatives in history of present illness above.     Objective:     Vitals:   04/11/16 1139  BP: 140/78  Pulse: 79  Temp: 97.5 F (36.4 C)  SpO2: 99%  Weight: 204 lb (92.5 kg)  Height: 5\' 9"  (1.753 m)    Physical Exam GEN: appears well, no apparent distress. CVS: RRR, nl S1&S2,  trace edema, faint DP & PT pulses bilaterally RESP: no IWOB, good air movement bilaterally, CTAB MSK: no focal tenderness or notable swelling, trace edema bilaterally SKIN: dry and hard skin in lower extremities NEURO: alert and oiented appropriately, no gross deficits. Abnormal monofilament exam with lost sensation in most of his toes.  PSYCH: euthymic mood with congruent affect    Assessment and Plan:  Diabetic polyneuropathy associated with type 2 diabetes mellitus ([place]) Discontinued Lyrica. Started gabapentin at 300 mg twice a day (renal dose). Wondering if there is a component of PAD contributing to his pain although his pain didn't feel like typical claudication. He had normal arterial doppler in 2016 except for occluded DP bilaterally. Will refer to Dr. Valentina Lucks for ABI. This might be tough with occluded DP.  He may needed arterial doppler or angio.    Return in about 1 week (around 04/18/2016) for ABI with Dr. Valentina Lucks.  Mercy Riding, MD 04/13/16 Pager: [phone]

## 2016-04-11 NOTE — Patient Instructions (Addendum)
It was great seeing you today! We have addressed the following issues today  1. Leg pain: This is likely related to your diabetes/diabetic neuropathy. I discontinued the Lyrica. I have started gabapentin. I also like to come back and see our pharmacist to check your blood flow in the legs.    If we did any lab work today, and the results require attention, either me or my nurse will get in touch with you. If everything is normal, you will get a letter in mail. If you don't hear from [place] in two weeks, please give [place] a call. Otherwise, we look forward to seeing you again at your next visit. If you have any questions or concerns before then, please call the clinic at [phone].   Please bring all your medications to every doctors visit   Sign up for My Chart to have easy access to your labs results, and communication with your Primary care physician.     Please check-out at the front desk before leaving the clinic.         Take Care,

## 2016-04-13 DIAGNOSIS — E1142 Type 2 diabetes mellitus with diabetic polyneuropathy: Secondary | ICD-10-CM | POA: Insufficient documentation

## 2016-04-13 NOTE — Assessment & Plan Note (Addendum)
Discontinued Lyrica. Started gabapentin at 300 mg twice a day (renal dose). Wondering if there is a component of PAD contributing to his pain although his pain didn't feel like typical claudication. He had normal arterial doppler in 2016 except for occluded DP bilaterally. Will refer to Dr. Valentina Lucks for ABI. This might be tough with occluded DP.  He may needed arterial doppler or angio.

## 2016-04-20 ENCOUNTER — Ambulatory Visit (INDEPENDENT_AMBULATORY_CARE_PROVIDER_SITE_OTHER): Payer: Medicare Other | Admitting: Student

## 2016-04-20 ENCOUNTER — Encounter: Payer: Self-pay | Admitting: Student

## 2016-04-20 VITALS — BP 130/72 | HR 74 | Temp 97.6°F | Ht 69.0 in | Wt 207.4 lb

## 2016-04-20 DIAGNOSIS — M5442 Lumbago with sciatica, left side: Secondary | ICD-10-CM | POA: Diagnosis not present

## 2016-04-20 DIAGNOSIS — G8929 Other chronic pain: Secondary | ICD-10-CM | POA: Diagnosis not present

## 2016-04-20 DIAGNOSIS — M5441 Lumbago with sciatica, right side: Secondary | ICD-10-CM

## 2016-04-20 MED ORDER — HYDROCODONE-ACETAMINOPHEN 7.5-325 MG PO TABS: 1.0000 | ORAL_TABLET | Freq: Three times a day (TID) | ORAL | 0 refills | Status: DC | PRN

## 2016-04-20 NOTE — Patient Instructions (Signed)
It was great seeing you today! We have addressed the following issues today 1. Low back pain: Refilled a prescription for 3 months. Please don't take this medication more than prescribed. Please make sure this medication is always locked. If you no longer need this medication, take it to police station or flush it in the toilet  2. Cough: I think this is due to common cold. You can try tablespoonful of honey before bedtime for cough. Keep yourself hydrated.  If we did any lab work today, and the results require attention, either me or my nurse will get in touch with you. If everything is normal, you will get a letter in mail and a message via . If you don't hear from [place] in two weeks, please give [place] a call. Otherwise, we look forward to seeing you again at your next visit. If you have any questions or concerns before then, please call the clinic at [phone].  Please bring all your medications to every doctors visit  Sign up for My Chart to have easy access to your labs results, and communication with your Primary care physician.    Please check-out at the front desk before leaving the clinic.    Take Care,   Dr. Cyndia Skeeters  It appears that you have a viral upper respiratory infection (Common Cold).  Cold symptoms can last up to 2 weeks.    - A tablespoonful of honey before bedtime is helpful for cough - Get plenty of rest and adequate hydration. - Consume warm fluids (soup or tea) to provide relief for a stuffy nose and to loosen phlegm. - For nasal stuffiness, try saline nasal spray or a Neti Pot. Eating warm liquids such as chicken soup or tea may also help with nasal congestion. - For sore throat pain relief: suck on throat lozenges, hard candy or popsicles; gargle with warm salt water (1/4 tsp. salt per 8 oz. of water); and eat soft, bland foods. - Eat a well-balanced diet. If you cannot, ensure you are getting enough nutrients by taking a daily multivitamin. - Avoid dairy products, as  they can thicken phlegm. - Avoid alcohol, as it impairs your body's immune system.  CONTACT YOUR DOCTOR IF YOU EXPERIENCE ANY OF THE FOLLOWING: - High fever, chest pain, shortness of breath or  not able to keep down food or fluids.  - Cough that gets worse while other cold symptoms improve - Flare up of any chronic lung problem, such as asthma - Your symptoms persist longer than 2 weeks

## 2016-04-20 NOTE — Progress Notes (Signed)
Subjective:    Tyler Lester is a 71 y.o. old male here for follow-up on his low back pain  HPI Back pain: pain is lower back bilaterally. Radiates down to his feet posteriorly. Pain is sharp. Pain comes and goes. Pain is worse with walking and sitting. Improves with standing and pain medicine. Overall, he states that the nature and intensity of his pain has not changed. He went to gym two days ago, rode bike and walked around. Goes to gym twice a week. He just had successful prostate surgery. Pathology was negative for malignancy. He will like to go to gym more often after he heals well. He denies fall, fever, chills, urinary retention and incontinence, bowel incontinence, numbness or weakness in his legs, nighttime sweats or unintentional weight loss. Lives alone. Has a nurse coming to his house. He keeps his medication in a locker.  PMH/Problem List: has Prediabetes; Essential hypertension; Anxiety; Organic impotence; GERD (gastroesophageal reflux disease); Fatigue; Nail dystrophy; Knee pain, bilateral; Chronic diastolic heart failure ([place]); Tremor; Cervical spine degeneration; CKD Stage 2; Depression; Myelopathy ([place]); Bilateral foot pain; Back pain with sciatica; Postlaminectomy syndrome, cervical region; Parkinson's disease ([place]); Spondylosis, cervical, with myelopathy; Cough; Left arm swelling; Hypokalemia; Shortness of breath; Gait abnormality; Dyspnea; Rhinitis, allergic; Parkinson disease ([place]); Chronic back pain; Type 2 diabetes mellitus with complication ([place]); Diminished hearing, left; BPH (benign prostatic hyperplasia); and Diabetic polyneuropathy associated with type 2 diabetes mellitus ([place]) on his problem list.   has a past medical history of Bilateral foot pain; Chronic pain; Diabetes mellitus; Foley catheter in place (since nov 2018, last changed 1 week ago); Hyperlipidemia; Hypertension; Inguinal hernia; Numbness; Parkinson's disease ([place]); Pneumonia (yrs ago); Shortness of breath; and  Urinary retention.  FH:  Family History  Problem Relation Age of Onset  . Cancer - Other Other   . Diabetes Other   . Hypertension Other     [place] Social History  Substance Use Topics  . Smoking status: Never Smoker  . Smokeless tobacco: Never Used  . Alcohol use 0.0 oz/week     Comment: 1 shot a day    Review of Systems Review of systems negative except for pertinent positives and negatives in history of present illness above.     Objective:     Vitals:   04/20/16 1124  BP: 130/72  Pulse: 74  Temp: 97.6 F (36.4 C)  TempSrc: Oral  SpO2: 99%  Weight: 207 lb 6.4 oz (94.1 kg)  Height: 5\' 9"  (1.753 m)    Physical Exam GEN: appears well, NAD CVS: RRR, normal s1 and s2, no edema RESP: no increased work of breathing GI: soft, non-tender,non-distended, +BS MSK: no apparent swelling or skin lesion over his back, some tenderness to palpation over left lower paraspinal muscles,  limited range of motion in his lower back in all directions, uses cane NEURO: Pill rolling tremor in left arm at rest, 5/5 in all extremities, less sensation intact in all extremities, patellar reflexes 1+ bilaterally, walks with help of cane, ?shuffling/limping gait.  PSYCH: appropriate mood and affect    Assessment and Plan:  Chronic back pain Continues to have chronic back pain again. He states that pain medicine makes pain bearable and enables him to be more active. I am impressed that he continues to go to gym even after his recent prostate surgery. No red flags at this time. Pathology from his recent prostate surgery was negative for malignancy. -Noted that he was given Percocet #30 when discharged from hospital after prostate  surgery. Otherwise, no red flag on [place] data base.  -Refilled his Norco 7.5/325. Gave him 3 prescriptions each for 90 tablets for 3 months total. -Advised not to take more than prescribed for any reason -Discussed about looking his medications -Discussed about appropriate  disposal if he don't need the medication  Return in about 2 months (around 06/18/2016) for DM.  Mercy Riding, MD 04/20/16 Pager: [phone]

## 2016-04-20 NOTE — Assessment & Plan Note (Addendum)
Continues to have chronic back pain again. He states that pain medicine makes pain bearable and enables him to be more active. I am impressed that he continues to go to gym even after his recent prostate surgery. No red flags at this time. Pathology from his recent prostate surgery was negative for malignancy. -Noted that he was given Percocet #30 when discharged from hospital after prostate surgery. Otherwise, no red flag on [place] data base.  -Refilled his Norco 7.5/325. Gave him 3 prescriptions each for 90 tablets for 3 months total. -Advised not to take more than prescribed for any reason -Discussed about looking his medications -Discussed about appropriate disposal if he don't need the medication

## 2016-04-26 ENCOUNTER — Emergency Department (HOSPITAL_COMMUNITY): Payer: Medicare Other

## 2016-04-26 ENCOUNTER — Encounter (HOSPITAL_COMMUNITY): Payer: Self-pay | Admitting: Emergency Medicine

## 2016-04-26 ENCOUNTER — Emergency Department (HOSPITAL_COMMUNITY)
Admission: EM | Admit: 2016-04-26 | Discharge: 2016-04-26 | Disposition: A | Discharge status: Home / Self Care | Payer: Medicare Other | Attending: Emergency Medicine | Admitting: Emergency Medicine

## 2016-04-26 DIAGNOSIS — I5032 Chronic diastolic (congestive) heart failure: Secondary | ICD-10-CM | POA: Insufficient documentation

## 2016-04-26 DIAGNOSIS — N182 Chronic kidney disease, stage 2 (mild): Secondary | ICD-10-CM | POA: Diagnosis not present

## 2016-04-26 DIAGNOSIS — I130 Hypertensive heart and chronic kidney disease with heart failure and stage 1 through stage 4 chronic kidney disease, or unspecified chronic kidney disease: Secondary | ICD-10-CM | POA: Diagnosis not present

## 2016-04-26 DIAGNOSIS — G20 Parkinson's disease: Secondary | ICD-10-CM | POA: Diagnosis not present

## 2016-04-26 DIAGNOSIS — M79671 Pain in right foot: Secondary | ICD-10-CM | POA: Diagnosis present

## 2016-04-26 DIAGNOSIS — E1122 Type 2 diabetes mellitus with diabetic chronic kidney disease: Secondary | ICD-10-CM | POA: Insufficient documentation

## 2016-04-26 DIAGNOSIS — G8929 Other chronic pain: Secondary | ICD-10-CM

## 2016-04-26 DIAGNOSIS — M25571 Pain in right ankle and joints of right foot: Secondary | ICD-10-CM | POA: Insufficient documentation

## 2016-04-26 MED ORDER — OXYCODONE-ACETAMINOPHEN 5-325 MG PO TABS
1.0000 | ORAL_TABLET | Freq: Once | ORAL | Status: AC
  Administered 2016-04-26: 1 via ORAL
  Filled 2016-04-26: qty 1

## 2016-04-26 NOTE — Discharge Instructions (Signed)
Read the information below.  You may return to the Emergency Department at any time for worsening condition or any new symptoms that concern you.   If you develop uncontrolled pain, weakness or numbness of the extremity, severe discoloration of the skin, or you are unable to walk, return to the ER for a recheck.    °

## 2016-04-26 NOTE — ED Notes (Signed)
Pt transported to xray 

## 2016-04-26 NOTE — ED Provider Notes (Signed)
[facility] DEPT Provider Note   CSN: [id number] Arrival date & time: 04/26/16  [id number]     History   Chief Complaint Chief Complaint  Patient presents with  . Foot Pain    HPI Tyler Lester is a 71 y.o. male.  HPI   Pt with chronic back pain, knee pain, bilateral foot pain, DM, neuropathy p/w right ankle and foot pain that is sharp and constant, worse with palpation.  Pt has taken Lyrica and norco this morning without relief.  Denies fevers, any injury the ankle or foot.  He does not have a history of gout.    Past Medical History:  Diagnosis Date  . Bilateral foot pain   . Chronic pain   . Diabetes mellitus    type 2, diet controlled now  . Foley catheter in place since nov 2018, last changed 1 week ago  . Hyperlipidemia   . Hypertension   . Inguinal hernia    right  . Numbness    T/O  . Parkinson's disease ([place])   . Pneumonia yrs ago  . Shortness of breath    with exertion  . Urinary retention     Patient Active Problem List   Diagnosis Date Noted  . Diabetic polyneuropathy associated with type 2 diabetes mellitus ([place]) 04/13/2016  . BPH (benign prostatic hyperplasia) 03/07/2016  . Diminished hearing, left 01/20/2016  . Type 2 diabetes mellitus with complication ([place]) [id number]  . Chronic back pain 09/16/2015  . Parkinson disease ([place]) 08/26/2015  . Rhinitis, allergic 08/25/2015  . Dyspnea 06/22/2015  . Shortness of breath 03/28/2015  . Gait abnormality 03/28/2015  . Left arm swelling 11/11/2014  . Hypokalemia 11/11/2014  . Cough 10/08/2014  . Postlaminectomy syndrome, cervical region 07/23/2014  . Parkinson's disease ([place]) 07/23/2014  . 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 Back pain with sciatica 05/27/2014  . Bilateral foot pain 05/14/2014  . Myelopathy ([place]) 11/12/2013  . Depression 10/15/2013  . CKD Stage 2 08/25/2013  . Cervical spine degeneration 07/13/2013  . Tremor 05/23/2013  . Chronic diastolic heart failure ([place])  05/06/2013  . Knee pain, bilateral 12/12/2012  . Nail dystrophy 05/14/2012  . Fatigue 07/05/2011  . GERD (gastroesophageal reflux disease) 07/04/2011  . 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 Prediabetes 01/05/2011  . Essential hypertension 01/05/2011  . Anxiety 01/05/2011    Past Surgical History:  Procedure Laterality Date  . CATARACT EXTRACTION W/ INTRAOCULAR LENS IMPLANT Left 06/2012   laser for posterior capsule?  . COLON SURGERY    . POSTERIOR CERVICAL FUSION/FORAMINOTOMY N/A 11/12/2013   Procedure: Posterior cervical decompression fusion, cervical 3-4, cervical 4-5, cervical 5-6, cervical 6-7 with instrumentation and allograft   (LEVEL 4);  Surgeon: Sinclair Ship, MD;  Location: [facility];  Service: Orthopedics;  Laterality: N/A;  Posterior cervical decompression fusion, cervical 3-4, cervical 4-5, cervical 5-6, cervical 6-7 with instrumentation and allograft  . TONSILLECTOMY    . XI ROBOTIC ASSISTED SIMPLE PROSTATECTOMY N/A 03/07/2016   Procedure: XI ROBOTIC ASSISTED SIMPLE PROSTATECTOMY AND UMBILICAL HERNIA REPAIR flexible cystoscopy;  Surgeon: Alexis Frock, MD;  Location: [facility];  Service: Urology;  Laterality: N/A;       Home Medications    Prior to Admission medications   Medication Sig Start Date End Date Taking? Authorizing Provider  baclofen (LIORESAL) 10 MG tablet Take 0.5 tablets (5 mg total) by mouth 2 (two) times daily as needed for muscle spasms. 12/02/15  Yes Mercy Riding, MD  carbidopa-levodopa (SINEMET) 25-100 MG tablet  Take 2 tablets by mouth 3 (three) times daily. Take 4 hours apart at 8am, noon and 4pm for example. Avoid high protein diet. 12/08/15  Yes Mercy Riding, MD  diclofenac (VOLTAREN) 75 MG EC tablet Take 75 mg by mouth 2 (two) times daily.  12/22/15  Yes Historical Provider, MD  fluticasone (FLONASE) 50 MCG/ACT nasal spray Place 2 sprays into both nostrils daily. Patient taking differently: Place 2 sprays into both nostrils daily as needed for allergies  or rhinitis.  12/01/15  Yes Mercy Riding, MD  HYDROcodone-acetaminophen (NORCO) 7.5-325 MG tablet Take 1 tablet by mouth every 8 (eight) hours as needed for moderate pain or severe pain. 04/20/16  Yes Mercy Riding, MD  HYDROcodone-acetaminophen (NORCO) 7.5-325 MG tablet Take 1 tablet by mouth every 8 (eight) hours as needed for moderate pain or severe pain. 04/20/16  Yes Mercy Riding, MD  HYDROcodone-acetaminophen (NORCO) 7.5-325 MG tablet Take 1 tablet by mouth every 8 (eight) hours as needed for moderate pain or severe pain. 04/20/16  Yes Mercy Riding, MD  loratadine (CLARITIN) 10 MG tablet Take 1 tablet (10 mg total) by mouth daily. 01/18/16  Yes Mercy Riding, MD  NONFORMULARY OR COMPOUNDED [place]:  Peripheral Neuropathy cream - Bupivacaine 1%, Doxepin 3%, Gabapentin 6%, Pentoxifylline 3%, Topiramate 1%, apply 1-2 grams to affected area 3-4 times daily. 10/28/15  Yes Trula Slade, DPM  oxyCODONE-acetaminophen (ROXICET) 5-325 MG tablet Take 1-2 tablets by mouth every 4 (four) hours as needed for severe pain. 03/07/16  Yes Lolita Rieger, MD  senna-docusate (SENOKOT-S) 8.6-50 MG tablet Take 1 tablet by mouth 2 (two) times daily. While taking strong pain meds to prevent constipation. 03/09/16  Yes Alexis Frock, MD  SSD 1 % cream APPLY TOPICALLY DAILY 02/16/15  Yes Trula Slade, DPM  venlafaxine XR (EFFEXOR-XR) 37.5 MG 24 hr capsule Take 1 capsule (37.5 mg total) by mouth daily. 01/23/16  Yes Mercy Riding, MD  atorvastatin (LIPITOR) 20 MG tablet Take 1 tablet (20 mg total) by mouth daily. 04/08/13   Kandis Nab, MD  gabapentin (NEURONTIN) 300 MG capsule Take 1 capsule (300 mg total) by mouth 2 (two) times daily. 04/11/16   Mercy Riding, MD  lisinopril (PRINIVIL,ZESTRIL) 20 MG tablet take 1 tablet by mouth once daily 10/24/15   Mercy Riding, MD    Family History Family History  Problem Relation Age of Onset  . Cancer - Other Other   . Diabetes Other   . Hypertension Other      Social History Social History  Substance Use Topics  . Smoking status: Never Smoker  . Smokeless tobacco: Never Used  . Alcohol use 0.0 oz/week     Comment: 1 shot a day     Allergies   Poison ivy treatments   Review of Systems Review of Systems  Constitutional: Negative for chills and fever.  Musculoskeletal: Positive for arthralgias.  Skin: Negative for color change and wound.  Neurological: Negative for weakness and numbness.  Psychiatric/Behavioral: Negative for self-injury.     Physical Exam Updated Vital Signs BP 141/82   Pulse 78   Temp 97.9 F (36.6 C) (Oral)   Resp 16   Ht 5\' 9"  (1.753 m)   Wt 93 kg   SpO2 98%   BMI 30.27 kg/m   Physical Exam  Constitutional: He appears well-developed and well-nourished. No distress.  HENT:  Head: Normocephalic and atraumatic.  Neck: Neck supple.  Cardiovascular: Normal  rate and regular rhythm.   Pulmonary/Chest: Effort normal and breath sounds normal.  Musculoskeletal:  Pt with neuropathy, hypersensitive to touch on bilateral feet.  No calf tenderness.  Right lateral malleolus tender to palpation, appears slightly edematous, no erythema, warmth.    Neurological: He is alert.  Skin: He is not diaphoretic.  Nursing note and vitals reviewed.    ED Treatments / Results  Labs (all labs ordered are listed, but only abnormal results are displayed) Labs Reviewed - No data to display  EKG  EKG Interpretation None       Radiology Dg Ankle Complete Right  Result Date: 04/26/2016 CLINICAL DATA:  Awoke with lateral malleolus pain. EXAM: RIGHT ANKLE - COMPLETE 3+ VIEW COMPARISON:  05/05/2014 FINDINGS: There is no evidence of fracture, dislocation, or joint effusion. Arterial calcification and nonspecific subcutaneous reticulation of the lower calf. Heel spurs. IMPRESSION: No acute osseous finding or ankle joint narrowing. Electronically Signed   By: Monte Fantasia M.D.   On: 04/26/2016 08:09   Dg Foot Complete  Right  Result Date: 04/26/2016 CLINICAL DATA:  Awoke with lateral ankle pain. EXAM: RIGHT FOOT COMPLETE - 3+ VIEW COMPARISON:  None. FINDINGS: No acute fracture or malalignment. No opaque foreign body or soft tissue gas. Mild spurring at the first MTP and lateral great toe sesamoids. Peroneus ossicles and medial navicular enthesophytes. Heel spurs. IMPRESSION: No acute finding or change since 2016. Electronically Signed   By: Monte Fantasia M.D.   On: 04/26/2016 08:11    Procedures Procedures (including critical care time)  Medications Ordered in ED Medications  oxyCODONE-acetaminophen (PERCOCET/ROXICET) 5-325 MG per tablet 1 tablet (1 tablet Oral Given 04/26/16 0817)     Initial Impression / Assessment and Plan / ED Course  I have reviewed the triage vital signs and the nursing notes.  Pertinent labs & imaging results that were available during my care of the patient were reviewed by me and considered in my medical decision making (see chart for details).     Afebrile, nontoxic patient with chronic back, knee, and foot pain.  No red flags with history or exam.  Neurovascularly intact.  Doubt septic joint, doubt gout.  Xrays negative.  Pt also seen by Dr Laneta Simmers.  D/C home with ace wrap, PCP follow up.  Discussed result, findings, treatment, and follow up  with patient.  Pt given return precautions.  Pt verbalizes understanding and agrees with plan.         Final Clinical Impressions(s) / ED Diagnoses   Final diagnoses:  Chronic pain of right ankle    New Prescriptions Discharge Medication List as of 04/26/2016  8:52 AM       Clayton Bibles, PA-C 04/26/16 1527    Leo Grosser, MD 04/26/16 1815

## 2016-04-26 NOTE — ED Triage Notes (Signed)
Pt from home, states he was awoken at 0500 this am by a throbbing pain in the right foot/ankle. Denies fall or trauma, states he took lyrica without relief. Pt states he has hx of chronic back and leg pain.

## 2016-04-27 ENCOUNTER — Ambulatory Visit: Payer: Medicare Other | Admitting: Pharmacist

## 2016-05-24 DIAGNOSIS — H26492 Other secondary cataract, left eye: Secondary | ICD-10-CM | POA: Diagnosis not present

## 2016-05-24 DIAGNOSIS — E119 Type 2 diabetes mellitus without complications: Secondary | ICD-10-CM | POA: Diagnosis not present

## 2016-05-24 DIAGNOSIS — Z961 Presence of intraocular lens: Secondary | ICD-10-CM | POA: Diagnosis not present

## 2016-05-24 DIAGNOSIS — H25811 Combined forms of age-related cataract, right eye: Secondary | ICD-10-CM | POA: Diagnosis not present

## 2016-05-24 DIAGNOSIS — H401131 Primary open-angle glaucoma, bilateral, mild stage: Secondary | ICD-10-CM | POA: Diagnosis not present

## 2016-05-24 LAB — HM DIABETES EYE EXAM

## 2016-06-05 DIAGNOSIS — H10413 Chronic giant papillary conjunctivitis, bilateral: Secondary | ICD-10-CM | POA: Diagnosis not present

## 2016-06-05 DIAGNOSIS — H0520 Unspecified exophthalmos: Secondary | ICD-10-CM | POA: Diagnosis not present

## 2016-06-05 DIAGNOSIS — H1045 Other chronic allergic conjunctivitis: Secondary | ICD-10-CM | POA: Diagnosis not present

## 2016-06-05 DIAGNOSIS — H47233 Glaucomatous optic atrophy, bilateral: Secondary | ICD-10-CM | POA: Diagnosis not present

## 2016-06-05 DIAGNOSIS — H401131 Primary open-angle glaucoma, bilateral, mild stage: Secondary | ICD-10-CM | POA: Diagnosis not present

## 2016-06-15 DIAGNOSIS — L600 Ingrowing nail: Secondary | ICD-10-CM | POA: Diagnosis not present

## 2016-06-15 DIAGNOSIS — I739 Peripheral vascular disease, unspecified: Secondary | ICD-10-CM | POA: Diagnosis not present

## 2016-06-15 DIAGNOSIS — L84 Corns and callosities: Secondary | ICD-10-CM | POA: Diagnosis not present

## 2016-06-15 DIAGNOSIS — M792 Neuralgia and neuritis, unspecified: Secondary | ICD-10-CM | POA: Diagnosis not present

## 2016-06-15 DIAGNOSIS — E1151 Type 2 diabetes mellitus with diabetic peripheral angiopathy without gangrene: Secondary | ICD-10-CM | POA: Diagnosis not present

## 2016-06-18 ENCOUNTER — Encounter: Payer: Self-pay | Admitting: Student

## 2016-06-18 ENCOUNTER — Ambulatory Visit (INDEPENDENT_AMBULATORY_CARE_PROVIDER_SITE_OTHER): Payer: Medicare Other | Admitting: Student

## 2016-06-18 VITALS — BP 138/74 | HR 87 | Temp 98.5°F | Wt 202.0 lb

## 2016-06-18 DIAGNOSIS — Z0000 Encounter for general adult medical examination without abnormal findings: Secondary | ICD-10-CM

## 2016-06-18 DIAGNOSIS — I10 Essential (primary) hypertension: Secondary | ICD-10-CM

## 2016-06-18 DIAGNOSIS — E1149 Type 2 diabetes mellitus with other diabetic neurological complication: Secondary | ICD-10-CM | POA: Diagnosis not present

## 2016-06-18 DIAGNOSIS — E118 Type 2 diabetes mellitus with unspecified complications: Secondary | ICD-10-CM

## 2016-06-18 DIAGNOSIS — R0982 Postnasal drip: Secondary | ICD-10-CM

## 2016-06-18 LAB — POCT GLYCOSYLATED HEMOGLOBIN (HGB A1C): Hemoglobin A1C: 6.4

## 2016-06-18 MED ORDER — FLUTICASONE PROPIONATE 50 MCG/ACT NA SUSP: 2.0000 | Freq: Every day | NASAL | 5 refills | Status: DC

## 2016-06-18 MED ORDER — ATORVASTATIN CALCIUM 20 MG PO TABS: 20.0000 mg | ORAL_TABLET | Freq: Every day | ORAL | 3 refills | Status: DC

## 2016-06-18 NOTE — Assessment & Plan Note (Addendum)
Well-controlled by lifestyle. Not on any diabetic medication. On Ace inhibitors for renal protection. His CKD stage IIIa. Up-to-date on his eye exam and Pneumovax and foot exam.   Check BMP  Follow-up in 6 months or sooner as needed.

## 2016-06-18 NOTE — Progress Notes (Signed)
  Subjective:    Tyler Lester is a 71 y.o. old male here for follow up on his diabetes.   HPI Diabetes: well controlled. A1c 6.4 today. It was 7.3% four months ago. He is not on diabetic medication. He reports going to [facility] and walking every other day. He is on Ace inhibitors for renal protection. He has CKD stage IIIA.  Denies symptoms of hypo-or hyperglycemia. He has diabetic neuropathy. He is on gabapentin. He says gabapentin is making him sleepy. He asks if there is another medication he can take. Unfortunately, he could not tolerate Cymbalta in the past which could have been good for his chronic pain as well. He is also on Norco for chronic back pain. He doesn't want to cut down on his Norco either. Denies recent fall.   PMH/Problem List: has Prediabetes; Essential hypertension; Anxiety; Organic impotence; GERD (gastroesophageal reflux disease); Fatigue; Nail dystrophy; Knee pain, bilateral; Chronic diastolic heart failure ([place]); Tremor; Cervical spine degeneration; CKD Stage 2; Depression; Myelopathy ([place]); Bilateral foot pain; Back pain with sciatica; Postlaminectomy syndrome, cervical region; Parkinson's disease ([place]); Spondylosis, cervical, with myelopathy; Cough; Left arm swelling; Shortness of breath; Gait abnormality; Dyspnea; Rhinitis, allergic; Parkinson disease ([place]); Chronic back pain; Type 2 diabetes mellitus with complication ([place]); Diminished hearing, left; BPH (benign prostatic hyperplasia); and Diabetic polyneuropathy associated with type 2 diabetes mellitus ([place]) on his problem list.   has a past medical history of Bilateral foot pain; Chronic pain; Diabetes mellitus; Foley catheter in place (since nov 2018, last changed 1 week ago); Hyperlipidemia; Hypertension; Inguinal hernia; Numbness; Parkinson's disease ([employer]); Pneumonia (yrs ago); Shortness of breath; and Urinary retention.  FH:  Family History  Problem Relation Age of Onset  . Cancer - Other Other   . Diabetes Other   .  Hypertension Other     [place] Social History  Substance Use Topics  . Smoking status: Never Smoker  . Smokeless tobacco: Never Used  . Alcohol use 0.0 oz/week     Comment: 1 shot a day    Review of Systems Review of systems negative except for pertinent positives and negatives in history of present illness above.     Objective:     Vitals:   06/18/16 1356  BP: 138/74  Pulse: 87  Temp: 98.5 F (36.9 C)  TempSrc: Oral  SpO2: 99%  Weight: 202 lb (91.6 kg)    Physical Exam GEN: appears well, no apparent distress. CVS: RRR, nl S1&S2,  trace edema, faint DP & PT pulses bilaterally RESP: no IWOB MSK: no focal tenderness or notable swelling, trace edema bilaterally SKIN: dry and hard skin in lower extremities NEURO: alert and oiented appropriately, no gross deficits..  PSYCH: euthymic mood with congruent affect    Assessment and Plan:  Type 2 diabetes mellitus with complication ([place]) Well-controlled by lifestyle. Not on any diabetic medication. On Ace inhibitors for renal protection. His CKD stage IIIa. Up-to-date on his eye exam and Pneumovax and foot exam.   Check BMP  Follow-up in 6 months or sooner as needed.    Letter of assistance to [employer] for assistance given. Also advised to call and talk to his case worker at [place]. He is already on disability for his medical conditions.   Orders Placed This Encounter  Procedures  . Basic metabolic panel  . HgB A1c    Return in about 6 months (around 12/18/2016) for DM.  Mercy Riding, MD 06/18/16 Pager: [phone]

## 2016-06-18 NOTE — Patient Instructions (Addendum)
It was great seeing you today! We have addressed the following issues today 1. Diabetes: Your A1c 6.4 today, which is good. Keep up the exercise. You can see [place] back in 6 months of sooner if needed.  2.   About your electricity: we have given you a letter to take to [employer]. I also recommend talking to your case worker at [facility].  3.   Cholesterol: I have refilled your cholesterol medication 4.   Colon cancer screening: I recommend you call one of the numbers and schedule your colonoscopy for colon cancer screening.  If we did any lab work today, and the results require attention, either me or my nurse will get in touch with you. If everything is normal, you will get a letter in mail and a message via . If you don't hear from [place] in two weeks, please give [place] a call. Otherwise, we look forward to seeing you again at your next visit. If you have any questions or concerns before then, please call the clinic at [phone].  Please bring all your medications to every doctors visit  Sign up for My Chart to have easy access to your labs results, and communication with your Primary care physician.    Please check-out at the front desk before leaving the clinic.    Take Care,   Dr. Cyndia Skeeters             Colon Cancer  People with early colon cancer usually have no warning signs or symptoms.  If found early, most patients can be cured, but if found when it has already spread, the chance of survival is not as good.  Colon cancer is the second most common cause of concern is in the [place] with over 56,000 deaths from colon cancer in 2005  Colon cancer is a common, treatable disease. Screening tests can find a cancer  early, before you have symptoms, and make it more likely that you will survive the disease.  Who needs to be tested? If you are age 83-75 yrs, you should be tested for colon cancer.  Ways to be tested:  A colonoscopy the best test to detect colon cancer. It requires you to drink a  bowel preparation to clean out your colon before the test. During this test, a tube with a camera inserted into your rectum and examines your entire colon. You can be given medicine to make you sleepy during the exam. Therefore, you will not be able to drive immediately after the test. There is a small risk of bowel injury during the test.   Stool cards that you can take home and take a sample of your stool is another option. The cards are not as good as colonoscopy at detecting cancer, but the tests are easier and cheaper.   To schedule the colonoscopy, you can call one of the 3 options below:  [facility]. Phone number: [phone]  [facility]. Phone number: [phone]   GI: Phone number [phone]

## 2016-06-18 NOTE — Assessment & Plan Note (Signed)
Emphasized about the importance of colonoscopy and gave him phone numbers to call and schedule

## 2016-06-18 NOTE — Progress Notes (Unsigned)
  Subjective:    Tyler Lester is a 71 y.o. old male here ***  HPI  PMH/Problem List: has Prediabetes; Essential hypertension; Anxiety; Organic impotence; GERD (gastroesophageal reflux disease); Fatigue; Nail dystrophy; Knee pain, bilateral; Chronic diastolic heart failure ([place]); Tremor; Cervical spine degeneration; CKD Stage 2; Depression; Myelopathy ([place]); Bilateral foot pain; Back pain with sciatica; Postlaminectomy syndrome, cervical region; Parkinson's disease ([place]); Spondylosis, cervical, with myelopathy; Cough; Left arm swelling; Shortness of breath; Gait abnormality; Dyspnea; Rhinitis, allergic; Parkinson disease ([place]); Chronic back pain; Type 2 diabetes mellitus with complication ([place]); Diminished hearing, left; BPH (benign prostatic hyperplasia); and Diabetic polyneuropathy associated with type 2 diabetes mellitus ([place]) on his problem list.   has a past medical history of Bilateral foot pain; Chronic pain; Diabetes mellitus; Foley catheter in place (since nov 2018, last changed 1 week ago); Hyperlipidemia; Hypertension; Inguinal hernia; Numbness; Parkinson's disease ([place]); Pneumonia (yrs ago); Shortness of breath; and Urinary retention.  FH:  Family History  Problem Relation Age of Onset  . Cancer - Other Other   . Diabetes Other   . Hypertension Other     [place] Social History  Substance Use Topics  . Smoking status: Never Smoker  . Smokeless tobacco: Never Used  . Alcohol use 0.0 oz/week     Comment: 1 shot a day    Review of Systems Review of systems negative except for pertinent positives and negatives in history of present illness above.     Objective:     There were no vitals filed for this visit.  Physical Exam GEN: appears well***, no apparent distress. Head: normocephalic and atraumatic  Eyes: conjunctiva without injection, sclera anicteric Ears: external ear and ear canal normal Nares: no rhinorrhea, congestion or erythema *** Oropharynx: mmm without erythema or  exudation HEM: negative for cervical or periauricular lymphadenopathies CVS: RRR, nl S1&S2, no murmurs, no edema,  2+ DP & PT pulses bilaterally, cap refills < 2 secs *** RESP: speaks in full sentence, no IWOB, good air movement bilaterally, CTAB GI: BS present & normal, soft, NTND, no guarding, no rebound, no mass GU: no suprapubic or CVA tenderness*** MSK: no focal tenderness or notable swelling SKIN: no apparent skin lesion *** ENDO: negative thyromegally *** NEURO: alert and oiented appropriately, no gross defecits  PSYCH: euthymic mood with congruent affect ***    Assessment and Plan:  No problem-specific Assessment & Plan notes found for this encounter.     No orders of the defined types were placed in this encounter.   No Follow-up on file.  Mercy Riding, MD 06/18/16 Pager: [phone]

## 2016-06-19 ENCOUNTER — Encounter: Payer: Self-pay | Admitting: Student

## 2016-06-19 LAB — BASIC METABOLIC PANEL
BUN/Creatinine Ratio: 14 (ref 10–24)
BUN: 19 mg/dL (ref 8–27)
CO2: 28 mmol/L (ref 18–29)
Calcium: 9.7 mg/dL (ref 8.6–10.2)
Chloride: 101 mmol/L (ref 96–106)
Creatinine, Ser: 1.38 mg/dL — ABNORMAL HIGH (ref 0.76–1.27)
GFR calc Af Amer: 60 mL/min/{1.73_m2} (ref >59)
GFR calc non Af Amer: 52 mL/min/{1.73_m2} — ABNORMAL LOW (ref >59)
Glucose: 123 mg/dL — ABNORMAL HIGH (ref 65–99)
Potassium: 4 mmol/L (ref 3.5–5.2)
Sodium: 140 mmol/L (ref 134–144)

## 2016-06-19 NOTE — Progress Notes (Signed)
BMP significant for mildly elevated sCr but improved. Sent result letter to patient.

## 2016-06-20 DIAGNOSIS — G20 Parkinson's disease: Secondary | ICD-10-CM | POA: Diagnosis not present

## 2016-06-21 ENCOUNTER — Other Ambulatory Visit: Payer: Self-pay | Admitting: Student

## 2016-06-21 ENCOUNTER — Telehealth: Payer: Self-pay

## 2016-06-21 NOTE — Telephone Encounter (Signed)
Informed pt that I will be placing a list of dentist in the mail today. Katharina Caper, Adalei Novell D, [name]

## 2016-06-21 NOTE — Telephone Encounter (Signed)
Would like [place] to give him the names of dentist he can go see, please call [phone]. Ottis Stain, CMA

## 2016-06-25 ENCOUNTER — Other Ambulatory Visit: Payer: Self-pay | Admitting: Student

## 2016-06-25 DIAGNOSIS — R338 Other retention of urine: Secondary | ICD-10-CM | POA: Diagnosis not present

## 2016-06-25 DIAGNOSIS — I5032 Chronic diastolic (congestive) heart failure: Secondary | ICD-10-CM

## 2016-06-25 DIAGNOSIS — I10 Essential (primary) hypertension: Secondary | ICD-10-CM

## 2016-06-25 DIAGNOSIS — N401 Enlarged prostate with lower urinary tract symptoms: Secondary | ICD-10-CM | POA: Diagnosis not present

## 2016-06-25 DIAGNOSIS — R972 Elevated prostate specific antigen [PSA]: Secondary | ICD-10-CM | POA: Diagnosis not present

## 2016-06-25 MED ORDER — LISINOPRIL 20 MG PO TABS: 20.0000 mg | ORAL_TABLET | Freq: Every day | ORAL | 3 refills | Status: DC

## 2016-06-25 NOTE — Telephone Encounter (Signed)
Pt needs refills on lyrica and lisionpril.  [facility] on [address].

## 2016-07-02 ENCOUNTER — Telehealth: Payer: Self-pay | Admitting: Student

## 2016-07-02 NOTE — Telephone Encounter (Signed)
Needs a cane.  He left his a drs office.

## 2016-07-02 NOTE — Telephone Encounter (Signed)
Sent inbasket message to Darlina Guys from [place]. Thanks! Bretta Bang

## 2016-07-05 NOTE — Telephone Encounter (Signed)
Pt states [facility] doesn't have anything for him. Pt wants to know when he can get a can. Please call pt. ep

## 2016-07-06 ENCOUNTER — Other Ambulatory Visit: Payer: Self-pay | Admitting: Student

## 2016-07-06 ENCOUNTER — Ambulatory Visit: Payer: Medicare Other | Admitting: Family Medicine

## 2016-07-06 DIAGNOSIS — M5442 Lumbago with sciatica, left side: Principal | ICD-10-CM

## 2016-07-06 DIAGNOSIS — M5441 Lumbago with sciatica, right side: Principal | ICD-10-CM

## 2016-07-06 DIAGNOSIS — G8929 Other chronic pain: Secondary | ICD-10-CM

## 2016-07-06 MED ORDER — HYDROCODONE-ACETAMINOPHEN 7.5-325 MG PO TABS: 1.0000 | ORAL_TABLET | Freq: Three times a day (TID) | ORAL | 0 refills | Status: DC | PRN

## 2016-07-06 NOTE — Telephone Encounter (Signed)
Pt has not gotten cane yet. [facility] is telling him they didn't get a message. Also, Pt has an appt on 07/27/2016 but will need a refill on his Norco before then. Is due for a refill on the 25th but no PCP visits available. Ottis Stain, CMA

## 2016-07-06 NOTE — Telephone Encounter (Signed)
I sent inbasket message Mrs. Stenson at [facility] about his walking cane. This is the response I got:  "This patient received a rollator from [facility] in 2017.  His insurance will not pay for a cane at this time.  He can come by our store to purchase one if they would like.  Thank you!"  There address and phone numbers are:  [address], [place], [place] [address] Phone: [phone] ext. [phone]  I tried to call patient twice and he wasn't picking his phone. Please let him know! Thanks! Dr. Cyndia Skeeters

## 2016-07-06 NOTE — Telephone Encounter (Signed)
Pt returned Dr. Juliann Pares call, informed pt of info below. He asked about his Norco refill. I spoke to Dr. Cyndia Skeeters who will take a look at the Rx and leave a paper Rx up front next week with the appropriate refill date.  I informed pt. Ottis Stain, CMA

## 2016-07-11 DIAGNOSIS — H25811 Combined forms of age-related cataract, right eye: Secondary | ICD-10-CM | POA: Diagnosis not present

## 2016-07-11 DIAGNOSIS — Z961 Presence of intraocular lens: Secondary | ICD-10-CM | POA: Diagnosis not present

## 2016-07-11 DIAGNOSIS — H26492 Other secondary cataract, left eye: Secondary | ICD-10-CM | POA: Diagnosis not present

## 2016-07-11 DIAGNOSIS — H40031 Anatomical narrow angle, right eye: Secondary | ICD-10-CM | POA: Diagnosis not present

## 2016-07-11 DIAGNOSIS — H401133 Primary open-angle glaucoma, bilateral, severe stage: Secondary | ICD-10-CM | POA: Diagnosis not present

## 2016-07-20 DIAGNOSIS — G20 Parkinson's disease: Secondary | ICD-10-CM | POA: Diagnosis not present

## 2016-07-26 ENCOUNTER — Ambulatory Visit (INDEPENDENT_AMBULATORY_CARE_PROVIDER_SITE_OTHER): Payer: Medicare Other | Admitting: Family Medicine

## 2016-07-26 ENCOUNTER — Encounter: Payer: Self-pay | Admitting: Family Medicine

## 2016-07-26 VITALS — BP 160/100 | HR 84 | Temp 97.7°F | Ht 69.0 in | Wt 211.0 lb

## 2016-07-26 DIAGNOSIS — R609 Edema, unspecified: Secondary | ICD-10-CM

## 2016-07-26 DIAGNOSIS — R0602 Shortness of breath: Secondary | ICD-10-CM | POA: Diagnosis not present

## 2016-07-26 NOTE — Assessment & Plan Note (Signed)
Patient here for SOB and edema x 1 month. Etiology unknown at this time. DDx includes CHF w/ fluid overload, anemia, kidney disease, infectious, and idiopathic.  Lung sounds clear making CHF less likely, but patient's weight is up ~8 lbs from last month. Recent labs indicate no evidence of advanced kidney disease. Patient placed on Atorvastatin at last visit >> statins are not known for causing edema. Patient endorsing rhinorrhea >> seasonal allergies can cause congestion and slight SOB but unlikely edema. Afebrile state makes infectious cause less likely. - Hold Statin x2 weeks  - Labs obtained today - I have asked patient to f/u with PCP to go over labs and to assess any improvement/worsening in symptoms - return precautions discussed - avoidance of high salt foods discussed.

## 2016-07-26 NOTE — Progress Notes (Signed)
   HPI  CC: SOB Noticed over the past month. 3 or 4 times noticed episodes of SOB that "take a while" for him to catch his breath. No CP. Noticed some Rhinorrhea. SOB worse in middle of the day. Not worse when laying down. Around the same time he noticed swelling in his Lt arm and bilateral feet.   Takes medications as prescribed. No fevers, HA, dizziness, weakness, numbness, n/v/d.  Review of Systems See HPI for ROS.   CC, SH/smoking status, and VS noted  Objective: BP (!) 160/100   Pulse 84   Temp 97.7 F (36.5 C) (Oral)   Ht 5\' 9"  (1.753 m)   Wt 211 lb (95.7 kg)   SpO2 93%   BMI 31.16 kg/m  Gen: NAD, alert, cooperative, flat affect. HEENT: NCAT, EOMI, PERRL, MMM CV: RRR, no murmur Resp: CTAB, no wheezes, non-labored Ext: warm. +1 edema in bilateral LE and left arm. No significant tenderness or erythema. Pulses intact throughout. Neuro: Alert and oriented, Speech clear, parkinsonian tremor noted.   Assessment and plan:  Dyspnea Patient here for SOB and edema x 1 month. Etiology unknown at this time. DDx includes CHF w/ fluid overload, anemia, kidney disease, infectious, and idiopathic.  Lung sounds clear making CHF less likely, but patient's weight is up ~8 lbs from last month. Recent labs indicate no evidence of advanced kidney disease. Patient placed on Atorvastatin at last visit >> statins are not known for causing edema. Patient endorsing rhinorrhea >> seasonal allergies can cause congestion and slight SOB but unlikely edema. Afebrile state makes infectious cause less likely. - Hold Statin x2 weeks  - Labs obtained today - I have asked patient to f/u with PCP to go over labs and to assess any improvement/worsening in symptoms - return precautions discussed - avoidance of high salt foods discussed.   Orders Placed This Encounter  Procedures  . Comprehensive metabolic panel    Order Specific Question:   Has the patient fasted?    Answer:   No  . CBC  . TSH  . CK    . Brain natriuretic peptide    Elberta Leatherwood, MD,MS,  PGY3 07/26/2016 5:12 PM

## 2016-07-26 NOTE — Patient Instructions (Signed)
It was a pleasure seeing you today in our clinic. Today we discussed your shortness of breath. Here is the treatment plan we have discussed and agreed upon together:   - Over the next 2 weeks I would stop taking your atorvastatin. - I've ordered labs today. - I would like to have you follow-up with your primary care provider next week. At that visit he will go over your labs. - If you have any additional symptoms, worsening of shortness of breath, chest pain, or develop fevers please come back for reevaluation sooner than 1 week, or seek reevaluation in the nearest emergency department.

## 2016-07-27 ENCOUNTER — Ambulatory Visit: Payer: Medicare Other | Admitting: Student

## 2016-07-27 ENCOUNTER — Encounter: Payer: Self-pay | Admitting: Student

## 2016-07-27 ENCOUNTER — Ambulatory Visit (INDEPENDENT_AMBULATORY_CARE_PROVIDER_SITE_OTHER): Payer: Medicare Other | Admitting: Student

## 2016-07-27 VITALS — BP 125/70 | HR 86 | Temp 97.4°F | Ht 69.0 in

## 2016-07-27 DIAGNOSIS — G8929 Other chronic pain: Secondary | ICD-10-CM | POA: Diagnosis not present

## 2016-07-27 DIAGNOSIS — M5441 Lumbago with sciatica, right side: Secondary | ICD-10-CM

## 2016-07-27 DIAGNOSIS — K5904 Chronic idiopathic constipation: Secondary | ICD-10-CM | POA: Diagnosis not present

## 2016-07-27 DIAGNOSIS — I739 Peripheral vascular disease, unspecified: Secondary | ICD-10-CM

## 2016-07-27 DIAGNOSIS — R600 Localized edema: Secondary | ICD-10-CM

## 2016-07-27 DIAGNOSIS — B353 Tinea pedis: Secondary | ICD-10-CM

## 2016-07-27 DIAGNOSIS — M7989 Other specified soft tissue disorders: Secondary | ICD-10-CM | POA: Diagnosis not present

## 2016-07-27 DIAGNOSIS — M5442 Lumbago with sciatica, left side: Secondary | ICD-10-CM | POA: Diagnosis not present

## 2016-07-27 DIAGNOSIS — M549 Dorsalgia, unspecified: Secondary | ICD-10-CM

## 2016-07-27 DIAGNOSIS — M5430 Sciatica, unspecified side: Secondary | ICD-10-CM

## 2016-07-27 LAB — COMPREHENSIVE METABOLIC PANEL
ALT: 6 IU/L (ref 0–44)
AST: 17 IU/L (ref 0–40)
Albumin/Globulin Ratio: 1.1 — ABNORMAL LOW (ref 1.2–2.2)
Albumin: 3.9 g/dL (ref 3.6–4.8)
Alkaline Phosphatase: 57 IU/L (ref 39–117)
BUN/Creatinine Ratio: 12 (ref 10–24)
BUN: 19 mg/dL (ref 8–27)
Bilirubin Total: 0.3 mg/dL (ref 0.0–1.2)
CO2: 22 mmol/L (ref 18–29)
Calcium: 9.5 mg/dL (ref 8.6–10.2)
Chloride: 102 mmol/L (ref 96–106)
Creatinine, Ser: 1.53 mg/dL — ABNORMAL HIGH (ref 0.76–1.27)
GFR calc Af Amer: 53 mL/min/{1.73_m2} — ABNORMAL LOW (ref >59)
GFR calc non Af Amer: 46 mL/min/{1.73_m2} — ABNORMAL LOW (ref >59)
Globulin, Total: 3.4 g/dL (ref 1.5–4.5)
Glucose: 108 mg/dL — ABNORMAL HIGH (ref 65–99)
Potassium: 4.2 mmol/L (ref 3.5–5.2)
Sodium: 140 mmol/L (ref 134–144)
Total Protein: 7.3 g/dL (ref 6.0–8.5)

## 2016-07-27 LAB — CBC
Hematocrit: 44.7 % (ref 37.5–51.0)
Hemoglobin: 15.1 g/dL (ref 13.0–17.7)
MCH: 29 pg (ref 26.6–33.0)
MCHC: 33.8 g/dL (ref 31.5–35.7)
MCV: 86 fL (ref 79–97)
Platelets: 195 10*3/uL (ref 150–379)
RBC: 5.2 x10E6/uL (ref 4.14–5.80)
RDW: 13.8 % (ref 12.3–15.4)
WBC: 6.2 10*3/uL (ref 3.4–10.8)

## 2016-07-27 LAB — TSH: TSH: 1.99 u[IU]/mL (ref 0.450–4.500)

## 2016-07-27 LAB — BRAIN NATRIURETIC PEPTIDE: BNP: 4.2 pg/mL (ref 0.0–100.0)

## 2016-07-27 LAB — CK: Total CK: 227 U/L — ABNORMAL HIGH (ref 24–204)

## 2016-07-27 MED ORDER — POLYETHYLENE GLYCOL 3350 17 GM/SCOOP PO POWD: 17.0000 g | Freq: Two times a day (BID) | ORAL | 1 refills | Status: DC | PRN

## 2016-07-27 MED ORDER — HYDROCODONE-ACETAMINOPHEN 7.5-325 MG PO TABS: 1.0000 | ORAL_TABLET | Freq: Four times a day (QID) | ORAL | 0 refills | Status: DC | PRN

## 2016-07-27 MED ORDER — HYDROCODONE-ACETAMINOPHEN 7.5-325 MG PO TABS: 1.0000 | ORAL_TABLET | Freq: Three times a day (TID) | ORAL | 0 refills | Status: DC | PRN

## 2016-07-27 MED ORDER — ASPIRIN EC 81 MG PO TBEC: 81.0000 mg | DELAYED_RELEASE_TABLET | Freq: Every day | ORAL | 11 refills | Status: DC

## 2016-07-27 MED ORDER — TORSEMIDE 20 MG PO TABS: 20.0000 mg | ORAL_TABLET | Freq: Every day | ORAL | 0 refills | Status: DC

## 2016-07-27 MED ORDER — NALOXONE HCL 2 MG/2ML IJ SOSY: 0.4000 mg | PREFILLED_SYRINGE | INTRAMUSCULAR | 1 refills | Status: DC | PRN

## 2016-07-27 NOTE — Patient Instructions (Signed)
It was great seeing you today! We have addressed the following issues today 1. Swelling in the legs: I sent a prescription for fluid pill to your pharmacy. Start taking it today. Please go to emergency department if you have chest pain, shortness of breath or other symptoms concerning to you. I have also ordered a referral to vascular surgery to look into the blood vessels in your legs.    If we did any lab work today, and the results require attention, either me or my nurse will get in touch with you. If everything is normal, you will get a letter in mail and a message via . If you don't hear from [place] in two weeks, please give [place] a call. Otherwise, we look forward to seeing you again at your next visit. If you have any questions or concerns before then, please call the clinic at [phone].  Please bring all your medications to every doctors visit  Sign up for My Chart to have easy access to your labs results, and communication with your Primary care physician.    Please check-out at the front desk before leaving the clinic.    Take Care,   Dr. Cyndia Skeeters

## 2016-07-27 NOTE — Progress Notes (Signed)
Subjective:    Tyler Lester is a 71 y.o. old male here swelling in his left leg and left arm  HPI Swelling of left leg and arm: for one month. Gotten worse over the last two to three days. Shortness of breath for about a month. Gradually getting worse. Denies chest pain, orthopnea, PND, fever and chill. Admits cough. Cough is productive with greenish phlegm. Denies hemoptysis. Denies history of blood clot.  Reports constipation. Denies nausea, vomiting or diarrhea.  He says he used to be on diuretics in the past but not any more.  Making urine as usual.  Denies smoking, drinking or recreational drug use.  Patient was seen in our clinic yesterday. CMP and CBC within normal range except for his CKD which is at baseline. BNP 4.2.   Patient was seen by Dr. Lorretta Harp in 2016. Patient had Nl ABI's bilaterally with occluded DP bilat. Dr. Gwenlyn Found suggested repeat LEA's as needed at that time.   Chronic back pain: unchanged from baseline. He has not gone to [facility] recently.Norco helped with the pain. He used to go at least twice a week and ride stationary bike and walk. He denies fall, fever, chills, urinary retention and incontinence, bowel incontinence, numbness or weakness in his legs, nighttime sweats or unintentional weight loss. He has a nurse coming to his house. He keeps his medication in a locker.   PMH/Problem List: has Prediabetes; Essential hypertension; Anxiety; Tinea pedis; Organic impotence; GERD (gastroesophageal reflux disease); Fatigue; Nail dystrophy; Knee pain, bilateral; Chronic diastolic heart failure ([place]); Tremor; Cervical spine degeneration; CKD Stage 2; Depression; Myelopathy ([place]); Bilateral foot pain; Back pain with sciatica; Postlaminectomy syndrome, cervical region; Parkinson's disease ([place]); Spondylosis, cervical, with myelopathy; Cough; Left arm swelling; Shortness of breath; Gait abnormality; Dyspnea; Rhinitis, allergic; Parkinson disease ([place]); Chronic back pain; Type 2  diabetes mellitus with complication ([place]); Diminished hearing, left; BPH (benign prostatic hyperplasia); Diabetic polyneuropathy associated with type 2 diabetes mellitus ([place]); Routine adult health maintenance; Leg swelling; and PAD (peripheral artery disease) ([place]) on his problem list.   has a past medical history of Bilateral foot pain; Chronic pain; Diabetes mellitus; Foley catheter in place (since nov 2018, last changed 1 week ago); Hyperlipidemia; Hypertension; Inguinal hernia; Numbness; Parkinson's disease ([place]); Pneumonia (yrs ago); Shortness of breath; and Urinary retention.  FH:  Family History  Problem Relation Age of Onset  . Cancer - Other Other   . Diabetes Other   . Hypertension Other     [place] Social History  Substance Use Topics  . Smoking status: Never Smoker  . Smokeless tobacco: Never Used  . Alcohol use 0.0 oz/week     Comment: 1 shot a day    Review of Systems Review of systems negative except for pertinent positives and negatives in history of present illness above.     Objective:     Vitals:   07/27/16 1522  BP: 125/70  Pulse: 86  Temp: 97.4 F (36.3 C)  TempSrc: Oral  SpO2: 97%  Height: 5\' 9"  (1.753 m)    Physical Exam GEN: came in on wheelchair and left on cane after the encounter.  CVS: RRR, nl S1&S2, no murmurs, 2+ edema bilaterally, no JVD or HJR, very faint DP & PT pulses bilaterally.  RESP: no IWOB, good air movement bilaterally, CTAB GI: BS present & normal, soft, NTND MSK: significant LE edema, calf circumference 45 cm in left and 43.5 cm in right. No Homans sign. Mild tenderness bilaterally. No skin erythema or sign  of infection. LUE with some swelling as well but this appears to be at baseline.  SKIN: some skin maceration between his toes NEURO: alert and oiented appropriately, motor 5/5 in his LE's bilaterally, light sensation diminished bilaterally (neuropathy), shuffling gait, some resting tremors at rest.  PSYCH: euthymic mood with  congruent affect    Assessment and Plan:  Leg swelling His exam is suggestive for venous insufficiency and peripheral arterial disease. He has 1-2 edema bilaterally. He used to be on lasix that was discontinued at his last hospitalization for his prostate surgery a couple of months ago. He also have very faint DP and PT pulsed concerning for PAD. He has slight calf asymmetry, 45 cm in the left and 43.5 cm in the left. So, DVT is on differential diagnosis but unlikely with bilateral edema/swelling. He has no crackles or JVD to think of CHF. BNP 4.2.  Doubt nephropathy with albumin of 4. No signs and symptoms of infection to think of cellulitis. He has no leukocytosis either. -Torsemide 20 mg daily -Will avoid compression stocking given PAD -Recommended leg elevation when sitting -Referral to vascular surgery for evaluation and management of PAD.  -Follow up in three days (on Monday). Will reassess and recheck BMP   Left arm swelling This is chronic issue. Swelling appears to be at baseline. Had vascular doppler in the past that was normal. Wonder if this caused by his Carbidopa-Levodopa. Hopefully, this would improve with torsemide as above.   PAD (peripheral artery disease) [facility]) Patient was seen by Dr. Lorretta Harp in 2016. Patient had Nl ABI's bilaterally with occluded DP bilat. Dr. Gwenlyn Found suggested repeat LEA's as needed at that time.  -Will send him back to Dr. Gwenlyn Found -Avoid compression stocking.   Tinea pedis Recommended washing feet, socks and shoe inserts daily and avoiding closed shoe when at home not walking around.  -Sent prescription for terbinafine cream to his pharmacy after patient left. Will call and let him know about the prescription.   Chronic back pain Stable. Says Norco helps his pain. No red flags. Gave three prescriptions for Norco 7.5/325 mg three times a day as needed for pain, for three months.      Orders Placed This Encounter  Procedures  . Ambulatory  referral to Vascular Surgery    Referral Priority:   Routine    Referral Type:   Surgical    Referral Reason:   Specialty Services Required    Requested Specialty:   Vascular Surgery    Number of Visits Requested:   1    Return in about 3 days (around 07/30/2016) for Leg Swelling.  Mercy Riding, MD 07/28/16 Pager: [phone]

## 2016-07-28 ENCOUNTER — Telehealth: Payer: Self-pay | Admitting: Student

## 2016-07-28 DIAGNOSIS — I739 Peripheral vascular disease, unspecified: Secondary | ICD-10-CM | POA: Insufficient documentation

## 2016-07-28 NOTE — Telephone Encounter (Signed)
Called patient to follow up on his leg swelling. Patient states that his leg is still swollen . He says he has started taking the medication (toresamide) yesterday afternoon. I realized that patient has not made follow up apt in three days as advised when I saw him yesterday. So, I put him on SDA for 2:15 pm on 07/30/2016. Patient to arrive 15 minutes early. Patient voiced understanding and agrees.

## 2016-07-28 NOTE — Assessment & Plan Note (Signed)
Stable. Says Norco helps his pain. No red flags. Gave three prescriptions for Norco 7.5/325 mg three times a day as needed for pain, for three months.

## 2016-07-28 NOTE — Assessment & Plan Note (Signed)
This is chronic issue. Swelling appears to be at baseline. Had vascular doppler in the past that was normal. Wonder if this caused by his Carbidopa-Levodopa. Hopefully, this would improve with torsemide as above.

## 2016-07-28 NOTE — Assessment & Plan Note (Signed)
Patient was seen by Dr. Lorretta Harp in 2016. Patient had Nl ABI's bilaterally with occluded DP bilat. Dr. Gwenlyn Found suggested repeat LEA's as needed at that time.  -Will send him back to Dr. Gwenlyn Found -Avoid compression stocking.

## 2016-07-28 NOTE — Assessment & Plan Note (Signed)
Recommended washing feet, socks and shoe inserts daily and avoiding closed shoe when at home not walking around.  -Sent prescription for terbinafine cream to his pharmacy after patient left. Will call and let him know about the prescription.

## 2016-07-28 NOTE — Assessment & Plan Note (Addendum)
His exam is suggestive for venous insufficiency and peripheral arterial disease. He has 1-2 edema bilaterally. He used to be on lasix that was discontinued at his last hospitalization for his prostate surgery a couple of months ago. He also have very faint DP and PT pulsed concerning for PAD. He has slight calf asymmetry, 45 cm in the left and 43.5 cm in the left. So, DVT is on differential diagnosis but unlikely with bilateral edema/swelling. He has no crackles or JVD to think of CHF. BNP 4.2.  Doubt nephropathy with albumin of 4. No signs and symptoms of infection to think of cellulitis. He has no leukocytosis either. -Torsemide 20 mg daily -Will avoid compression stocking given PAD -Recommended leg elevation when sitting -Referral to vascular surgery for evaluation and management of PAD.  -Follow up in three days (on Monday). Will reassess and recheck BMP

## 2016-07-30 ENCOUNTER — Ambulatory Visit (INDEPENDENT_AMBULATORY_CARE_PROVIDER_SITE_OTHER): Payer: Medicare Other | Admitting: Student

## 2016-07-30 VITALS — BP 120/60 | HR 82 | Temp 97.5°F | Ht 69.0 in | Wt 208.2 lb

## 2016-07-30 DIAGNOSIS — R609 Edema, unspecified: Secondary | ICD-10-CM

## 2016-07-30 DIAGNOSIS — M7989 Other specified soft tissue disorders: Secondary | ICD-10-CM

## 2016-07-30 NOTE — Progress Notes (Signed)
Subjective:    Tyler Lester is a 71 y.o. old male here for follow up on leg swelling  HPI Leg swelling: improved. Patient was evaluated for leg swelling about three days ago. Swelling was likely due venous insufficiency as well as PAD. He was started on Torsemide 20 mg daily. He reports taking this. He has been urinating a lot. The swelling and pain has improved. He denies shortness of breath, dizziness or chest pain.   PMH/Problem List: has Prediabetes; Essential hypertension; Anxiety; Tinea pedis; Organic impotence; GERD (gastroesophageal reflux disease); Fatigue; Nail dystrophy; Knee pain, bilateral; Chronic diastolic heart failure ([place]); Tremor; Cervical spine degeneration; CKD Stage 2; Depression; Myelopathy ([place]); Bilateral foot pain; Back pain with sciatica; Postlaminectomy syndrome, cervical region; Parkinson's disease ([place]); Spondylosis, cervical, with myelopathy; Cough; Left arm swelling; Shortness of breath; Gait abnormality; Dyspnea; Rhinitis, allergic; Parkinson disease ([place]); Chronic back pain; Type 2 diabetes mellitus with complication ([place]); Diminished hearing, left; BPH (benign prostatic hyperplasia); Diabetic polyneuropathy associated with type 2 diabetes mellitus ([place]); Routine adult health maintenance; Leg swelling; and PAD (peripheral artery disease) ([place]) on his problem list.   has a past medical history of Bilateral foot pain; Chronic pain; Diabetes mellitus; Foley catheter in place (since nov 2018, last changed 1 week ago); Hyperlipidemia; Hypertension; Inguinal hernia; Numbness; Parkinson's disease ([place]); Pneumonia (yrs ago); Shortness of breath; and Urinary retention.  FH:  Family History  Problem Relation Age of Onset  . Cancer - Other Other   . Diabetes Other   . Hypertension Other     [place] Social History  Substance Use Topics  . Smoking status: Never Smoker  . Smokeless tobacco: Never Used  . Alcohol use 0.0 oz/week     Comment: 1 shot a day    Review of  Systems Review of systems negative except for pertinent positives and negatives in history of present illness above.     Objective:     Vitals:   07/30/16 1421  BP: 120/60  Pulse: 82  Temp: 97.5 F (36.4 C)  TempSrc: Oral  SpO2: 98%  Weight: 208 lb 3.2 oz (94.4 kg)  Height: 5\' 9"  (1.753 m)    Physical Exam GEN: appears well, no apparent distress CVS: RRR, nl S1&S2, no murmurs, 1+ edema bilaterally RESP: no IWOB, good air movement bilaterally, CTAB GI: BS present & normal, soft, NTND MSK: significant LE edema, his legs appears less tense, no Homans sign, no tenderness bilaterally. No skin erythema or sign of infection. LUE with some swelling as well. Improved from three days ago.  NEURO: alert and oiented appropriately, motor 5/5 in his LE's bilaterally, light sensation diminished bilaterally (neuropathy), shuffling gait, some resting tremors in left arm at rest.  PSYCH: euthymic mood with congruent affect    Assessment and Plan:  Leg swelling Improved with torsemide 20 mg daily. Patient reports good UOP. He is 3 lbs down. He denies cardiopulmonary symptoms. Ordered BMP. However, it was brought to my attention that didn't want to wait for lab draw and left. He also didn't schedule a future lab visit for his BMP while on torsemide. Will try to follow up on this by phone.      Orders Placed This Encounter  Procedures  . Basic metabolic panel    Standing Status:   Future    Standing Expiration Date:   07/30/2017  . Magnesium    Standing Status:   Future    Standing Expiration Date:   07/30/2017    Return in about 2 weeks (  around 08/13/2016) for Lab visit.  Mercy Riding, MD 07/30/16 Pager: [phone]

## 2016-07-30 NOTE — Patient Instructions (Signed)
It was great seeing you today! We have addressed the following issues today 1. Leg swelling: Continue taking the fluid pill we gave you. Continue elevating the legs. Schedule a lab visit follow-up in 2 weeks  If we did any lab work today, and the results require attention, either me or my nurse will get in touch with you. If everything is normal, you will get a letter in mail and a message via . If you don't hear from [place] in two weeks, please give [place] a call. Otherwise, we look forward to seeing you again at your next visit. If you have any questions or concerns before then, please call the clinic at [phone].  Please bring all your medications to every doctors visit  Sign up for My Chart to have easy access to your labs results, and communication with your Primary care physician.    Please check-out at the front desk before leaving the clinic.    Take Care,   Dr. Cyndia Skeeters

## 2016-07-30 NOTE — Assessment & Plan Note (Signed)
Improved with torsemide 20 mg daily. Patient reports good UOP. He is 3 lbs down. He denies cardiopulmonary symptoms. Ordered BMP. However, it was brought to my attention that didn't want to wait for lab draw and left. He also didn't schedule a future lab visit for his BMP while on torsemide. Will try to follow up on this by phone.

## 2016-08-01 ENCOUNTER — Other Ambulatory Visit: Payer: Self-pay

## 2016-08-01 ENCOUNTER — Encounter: Payer: Self-pay | Admitting: Student

## 2016-08-01 DIAGNOSIS — I739 Peripheral vascular disease, unspecified: Secondary | ICD-10-CM

## 2016-08-01 NOTE — Pre-Procedure Instructions (Signed)
Called patient and advised to call clinic for lab visit for his BMP today. Patient agrees to do as advised.

## 2016-08-02 ENCOUNTER — Ambulatory Visit: Payer: Medicare Other | Admitting: Family Medicine

## 2016-08-14 ENCOUNTER — Other Ambulatory Visit: Payer: Self-pay | Admitting: Student

## 2016-08-27 ENCOUNTER — Ambulatory Visit: Payer: Medicare Other | Admitting: Student

## 2016-09-03 ENCOUNTER — Ambulatory Visit: Payer: Medicare Other | Admitting: Student

## 2016-09-04 ENCOUNTER — Telehealth: Payer: Self-pay | Admitting: Student

## 2016-09-04 NOTE — Telephone Encounter (Signed)
Pt is calling and would like to speak to his doctor about his hand. He has an appointment next week, but his hand is really bothering him. I offered and appointment with another doctor for this week, but he really just wants to see his doctor. jw

## 2016-09-05 NOTE — Telephone Encounter (Signed)
Attempted to call patient three times (today and yesterday). Patient didn't pick up his phone. I wonder if someone try again at the later time. Thanks! Bretta Bang

## 2016-09-07 ENCOUNTER — Encounter: Payer: Self-pay | Admitting: Family Medicine

## 2016-09-07 ENCOUNTER — Ambulatory Visit (INDEPENDENT_AMBULATORY_CARE_PROVIDER_SITE_OTHER): Payer: Medicare Other | Admitting: Family Medicine

## 2016-09-07 ENCOUNTER — Inpatient Hospital Stay (HOSPITAL_COMMUNITY): Admission: RE | Admit: 2016-09-07 | Payer: Medicare Other | Source: Ambulatory Visit

## 2016-09-07 VITALS — BP 118/64 | HR 78 | Temp 98.0°F | Ht 69.0 in | Wt 210.6 lb

## 2016-09-07 DIAGNOSIS — M7989 Other specified soft tissue disorders: Secondary | ICD-10-CM | POA: Diagnosis not present

## 2016-09-07 DIAGNOSIS — M79642 Pain in left hand: Secondary | ICD-10-CM | POA: Diagnosis not present

## 2016-09-07 NOTE — Patient Instructions (Signed)
It was nice seeing you today. I am sorry about your chronic hand swelling and mild pain. You had an U/S last year November which was normal. Let us recheck again.   I reviewed your [place] from 2015, it showed a soft tissue mass. I doubt it is related to your hand swelling. Please see Dr. Cyndia Skeeters next week to discuss MRI or CT scan of your neck.   In the mean time, we have ordered some lab to check the cause of your hand swelling. Use OTC hand compression sleeves. I have also referred you to Physical therapy for this. I will contact you soon as I get your test result.

## 2016-09-07 NOTE — Progress Notes (Signed)
Subjective:     Patient ID: Tyler Lester, male   DOB: 04-Jan-1946, 71 y.o.   MRN: [id number]  HPI Hand swelling: C/O left hand swelling for several years, he has been using Torsemide without any improvement. He endorsed occasional pain but currently  denies any associated pain or redness. He denies recent trauma or injury to his hand. No fever. He denies neck or shoulder pain.  Current Outpatient Prescriptions on File Prior to Visit  Medication Sig Dispense Refill  . aspirin EC 81 MG tablet Take 1 tablet (81 mg total) by mouth daily. 30 tablet 11  . atorvastatin (LIPITOR) 20 MG tablet Take 1 tablet (20 mg total) by mouth daily. 90 tablet 3  . carbidopa-levodopa (SINEMET) 25-100 MG tablet Take 2 tablets by mouth 3 (three) times daily. Take 4 hours apart at 8am, noon and 4pm for example. Avoid high protein diet. 180 tablet 5  . gabapentin (NEURONTIN) 300 MG capsule Take 1 capsule (300 mg total) by mouth 2 (two) times daily. 60 capsule 3  . HYDROcodone-acetaminophen (NORCO) 7.5-325 MG tablet Take 1 tablet by mouth every 8 (eight) hours as needed for moderate pain or severe pain. 90 tablet 0  . lisinopril (PRINIVIL,ZESTRIL) 20 MG tablet Take 1 tablet (20 mg total) by mouth daily. 90 tablet 3  . torsemide (DEMADEX) 20 MG tablet Take 1 tablet (20 mg total) by mouth daily. 30 tablet 0  . fluticasone (FLONASE) 50 MCG/ACT nasal spray Place 2 sprays into both nostrils daily. 16 g 5  . HYDROcodone-acetaminophen (NORCO) 7.5-325 MG tablet Take 1 tablet by mouth every 6 (six) hours as needed for moderate pain. 90 tablet 0  . HYDROcodone-acetaminophen (NORCO) 7.5-325 MG tablet Take 1 tablet by mouth every 6 (six) hours as needed for moderate pain. 90 tablet 0  . naloxone (NARCAN) 2 MG/2ML injection Inject 0.4 mLs (0.4 mg total) into the muscle as needed (opiod overdose. May repeat in two to three minutes.). 2 mL 1  . NONFORMULARY OR COMPOUNDED ITEM [facility]:  Peripheral Neuropathy cream - Bupivacaine  1%, Doxepin 3%, Gabapentin 6%, Pentoxifylline 3%, Topiramate 1%, apply 1-2 grams to affected area 3-4 times daily. 120 each 2  . polyethylene glycol powder (GLYCOLAX/MIRALAX) powder Take 17 g by mouth 2 (two) times daily as needed. 3350 g 1  . senna-docusate (SENOKOT-S) 8.6-50 MG tablet Take 1 tablet by mouth 2 (two) times daily. While taking strong pain meds to prevent constipation. 30 tablet 0  . SSD 1 % cream APPLY TOPICALLY DAILY 50 g 0   Current Facility-Administered Medications on File Prior to Visit  Medication Dose Route Frequency Provider Last Rate Last Dose  . magnesium citrate solution 1 Bottle  1 Bottle Oral Once Alexis Frock, MD       Past Medical History:  Diagnosis Date  . Bilateral foot pain   . Chronic pain   . Diabetes mellitus    type 2, diet controlled now  . Foley catheter in place since nov 2018, last changed 1 week ago  . Hyperlipidemia   . Hypertension   . Inguinal hernia    right  . Numbness    T/O  . Parkinson's disease ([place])   . Pneumonia yrs ago  . Shortness of breath    with exertion  . Urinary retention      Review of Systems  Respiratory: Negative.   Cardiovascular: Negative.   Gastrointestinal: Negative.   Musculoskeletal:       Left hand edema  All  other systems reviewed and are negative.  Vitals:   09/07/16 1016  BP: 118/64  Pulse: 78  Temp: 98 F (36.7 C)  TempSrc: Oral  SpO2: 98%  Weight: 210 lb 9.6 oz (95.5 kg)  Height: 5\' 9"  (1.753 m)        Objective:   Physical Exam  Constitutional: He is oriented to person, place, and time. He appears well-developed. No distress.  Cardiovascular: Normal rate, regular rhythm and normal heart sounds.   No murmur heard. Pulmonary/Chest: Effort normal and breath sounds normal. No respiratory distress. He has no wheezes.  Abdominal: Soft. Bowel sounds are normal. He exhibits no distension and no mass. There is no tenderness.  Musculoskeletal:       Right shoulder: Normal.       Left  shoulder: He exhibits decreased range of motion. He exhibits no tenderness.       Arms: Neurological: He is alert and oriented to person, place, and time. No cranial nerve deficit.  Nursing note and vitals reviewed.      Assessment:     Left hand swelling Differentials include lymphedema vs thoracic outlet obstruction syndrome vs DVT vs complex regional pain syndrome vs autoimmune disease    Plan:     DVT less likely given no pain or erythema. However, I will recheck his Duplex US. Thoracic outlet obstruction syndrome less likely given no shoulder or arm swelling or pain and symptoms is not associated with raising his hands up. Of note, he had an [place] in 2015 showing 2 cm supraclavicular soft tissue mass. I called the radiologist to discuss result as a potential cause of his symptoms. Dr. Luther Parody stated this is so superficial and unlikely to cause any mass effect. As discussed with patient, he is to follow-up with his PCP as planned next Tuesday for Imaging of his neck and chest to assess further.  He agreed with plan. I will defer further management of the mass to his PCP since this is a chronic problem for him. Complex regional pain syndrome a possibility, however, he currently does not have pain and symptoms affects only his hand. Continue Gabapentin for now for intermittent hand pain. Assess for autoimmune condition. ASA and RF checked. I will contact with result. I referred him to PT for lymphedema management. Duplex US scheduled for Friday 13th, however he is unable to make the appointment, hence it was rescheduled during the visit to next Monday. In the mean time, I recommended OTC hand compression sleeve to help with symptoms. Diuresis will unlikely be effective, given that edema is non-pitting. Return precaution discussed. F/U with PCP as planned.  More than 50% of this 30 min face to face encounter was spent of counseling and coordination of care (discussion with radiologist,  scheduling and rescheduling duplex US).

## 2016-09-07 NOTE — Telephone Encounter (Signed)
Pt came into office today for a visit concerning his hand. Katharina Caper, [name], [name]

## 2016-09-08 LAB — C-REACTIVE PROTEIN: CRP: 2.5 mg/L (ref 0.0–4.9)

## 2016-09-08 LAB — ANA: Anti Nuclear Antibody(ANA): NEGATIVE

## 2016-09-08 LAB — RHEUMATOID FACTOR: Rhuematoid fact SerPl-aCnc: 15.4 IU/mL — ABNORMAL HIGH (ref 0.0–13.9)

## 2016-09-09 ENCOUNTER — Telehealth: Payer: Self-pay | Admitting: Family Medicine

## 2016-09-09 NOTE — Telephone Encounter (Signed)
Result discussed with patient. RF slightly elevated. He will benefit from a more specific test for RA. Patient will discuss with PCP next Tuesday. He does not have any question for me today.

## 2016-09-10 ENCOUNTER — Ambulatory Visit (HOSPITAL_COMMUNITY): Admission: RE | Admit: 2016-09-10 | Payer: Medicare Other | Source: Ambulatory Visit

## 2016-09-11 ENCOUNTER — Ambulatory Visit (INDEPENDENT_AMBULATORY_CARE_PROVIDER_SITE_OTHER): Payer: Medicare Other | Admitting: Internal Medicine

## 2016-09-11 ENCOUNTER — Ambulatory Visit: Payer: Medicare Other | Admitting: Student

## 2016-09-11 ENCOUNTER — Encounter: Payer: Self-pay | Admitting: Licensed Clinical Social Worker

## 2016-09-11 ENCOUNTER — Ambulatory Visit: Payer: Medicare Other

## 2016-09-11 VITALS — BP 140/84 | HR 72 | Temp 97.4°F | Wt 211.0 lb

## 2016-09-11 DIAGNOSIS — M7989 Other specified soft tissue disorders: Secondary | ICD-10-CM | POA: Diagnosis not present

## 2016-09-11 NOTE — Assessment & Plan Note (Addendum)
Left arm swelling was recently evaluated by Dr.  Gwendlyn Deutscher  on 7/13. Patient has canceled several appointments for ultrasound evaluation. Hx of parkinson and failed mini-mental exam - Will have patient evaluated by social work- consider [facility] to help patient get to appointments - Planning to reschedule ultrasound -awaiting call back  - Will hold off on CT neck and chest for now as patient can not have contrast  - Scr 1.5  - Social to follow up with patient regarding transportation  - Referral already placed for ortho evaluation per Dr. Gwendlyn Deutscher to be evaluated on July 30 th  - Discussed with Dr. Wendy Poet

## 2016-09-11 NOTE — Progress Notes (Signed)
   [facility] Kerrin Mo, MD Phone: [phone]  Reason For Visit: Left arm swelling   # Previously evaluated on Friday for left arm swelling. Patient has missed his ultrasound Doppler appointment. He states that he has canceled it 2. Concern for Parkinson's/dementia contributing to patient's lack of ability to keep appointments. Left arm swelling, patient has had a PICC line placed in the past which could be contributing to patient's long term swelling   Past Medical History Reviewed problem list.  Medications- reviewed and updated No additions to family history Social history- patient is a non- smoker  Objective: BP 140/84   Pulse 72   Temp (!) 97.4 F (36.3 C) (Oral)   Wt 211 lb (95.7 kg)   BMI 31.16 kg/m  Gen: NAD, alert, cooperative with exam Extremities: Left arm swelling, decrease motion in left arm,   Assessment/Plan: See problem based a/p  Left arm swelling Left arm swelling was recently evaluated by Dr.  Gwendlyn Deutscher  on 7/13. Patient has canceled several appointments for ultrasound evaluation. Hx of parkinson and failed mini-mental exam - Will have patient evaluated by social work- consider [facility] to help patient get to appointments - Planning to reschedule ultrasound -awaiting call back  - Will hold off on CT neck and chest for now as patient can not have contrast  - Scr 1.5  - Social to follow up with patient regarding transportation  - Referral already placed for ortho evaluation per Dr. Gwendlyn Deutscher to be evaluated on July 30 th  - Discussed with Dr. Wendy Poet

## 2016-09-11 NOTE — Patient Instructions (Signed)
I will let you know when to schedule your next ultrasound. [name] will follow up with you

## 2016-09-11 NOTE — Progress Notes (Signed)
Social work consult from Dr. Emmaline Life reference transportation concerns and missed appointments.    Patient lives alone, support system is his male friend and his brother Izell [place].  Patient states he has Medicaid and received PCS services 7 days a week.   Patient rides the bus, uses [employer] and [employer] however  he shared barriers to utilizing each service.  No money, unable to walk to bus stop and not calling soon enough to schedule the ride.  LCSW reviewed transportation resource sheet with patient and reviewed his upcoming appointments.    Patient has requested for 09/24/16 Ortho appointment in [place],  to be changed to [facility],   [place] shared with referral coordinator.  Intervention: emotional support and community resources   Plan:  1. Patient will call Medicaid Transportation to schedule the ride to his appointments. 2. LCSW will f/u in 2 to 3 days to see if rides have been schedules 3. Patient will call LCSW if he is unable to get to appointments   Casimer Lanius, LCSW Licensed Clinical Social Worker [place]   [phone] 4:20 PM

## 2016-09-12 ENCOUNTER — Ambulatory Visit: Payer: Medicare Other | Admitting: Family Medicine

## 2016-09-13 ENCOUNTER — Telehealth: Payer: Self-pay | Admitting: Licensed Clinical Social Worker

## 2016-09-13 NOTE — Addendum Note (Signed)
Addended by: Levert Feinstein F on: 09/13/2016 04:10 PM   Modules accepted: Orders

## 2016-09-13 NOTE — Progress Notes (Signed)
LCSW informed by CMA that patient's appointment has been rescheduled at [facility]. [address] at 11:45am on July 26th.  Called patient and left voice message ref appointment day, time and location.  Also reminded patient to call Medicaid Transportation to schedule his ride.  Plan:  LCSW will F/U with patient in 3 business days.  Casimer Lanius, LCSW Licensed Clinical Social Worker [place]   [phone] 4:16 PM

## 2016-09-17 ENCOUNTER — Encounter: Payer: Self-pay | Admitting: Vascular Surgery

## 2016-09-18 ENCOUNTER — Other Ambulatory Visit: Payer: Self-pay | Admitting: Student

## 2016-09-18 ENCOUNTER — Telehealth: Payer: Self-pay | Admitting: Licensed Clinical Social Worker

## 2016-09-18 DIAGNOSIS — G20 Parkinson's disease: Secondary | ICD-10-CM

## 2016-09-18 DIAGNOSIS — M549 Dorsalgia, unspecified: Secondary | ICD-10-CM

## 2016-09-18 DIAGNOSIS — E1142 Type 2 diabetes mellitus with diabetic polyneuropathy: Secondary | ICD-10-CM

## 2016-09-18 DIAGNOSIS — M5430 Sciatica, unspecified side: Secondary | ICD-10-CM

## 2016-09-18 DIAGNOSIS — R4189 Other symptoms and signs involving cognitive functions and awareness: Secondary | ICD-10-CM

## 2016-09-18 DIAGNOSIS — I10 Essential (primary) hypertension: Secondary | ICD-10-CM

## 2016-09-18 DIAGNOSIS — I5032 Chronic diastolic (congestive) heart failure: Secondary | ICD-10-CM

## 2016-09-18 NOTE — Progress Notes (Signed)
Patient contacted and informed by Rudi Rummage RN of appointment date/time/location.  She provided patient the phone number and reminded him that it was important he call Medicaid transportation to arrange transportation for appointment on July 30th. F/U call to patient to confirm transportation is set-up for his next three appointments.  Patient unable to schedule Medicaid Transportation for July 26th appointment due to needing a 3 day window. Per patient he also tried to schedule a ride for his July 30th appointment but was told he could not schedule the appointment.    LCSW called Medicaid Transportation for clarification.  Per scheduler all out of county appointments must be scheduled at least 5 days in advance.  [place] appointment at [facility] rescheduled for Aug. 8th at 11:00.  Called Medicaid Transportation to schedule the ride  Plan:  1. LCSW will provide patient with $3.00 from [place] to ride [employer] to his July 26th appointment. 2. Will F/U with patient for transportation coordination for Aug appointments .  Casimer Lanius, LCSW Licensed Clinical Social Worker [place]   [phone] 4:34 PM

## 2016-09-19 ENCOUNTER — Other Ambulatory Visit: Payer: Self-pay | Admitting: *Deleted

## 2016-09-19 ENCOUNTER — Encounter: Payer: Self-pay | Admitting: Licensed Clinical Social Worker

## 2016-09-19 NOTE — Progress Notes (Signed)
Office visit by patient and his brother to pick up $3.00 for patient to ride [place] to appointment tomorrow.  LCSW also discussed the importance of getting Power of [place] and provided resources for free assistance.  Patient's brother agreed to take patient to both medical appointments on Aug. 2nd.  Provided $10.00 [employer] card for gas.  Patient appreciative of assistance. Understands he will need to budget money for future appointments, however will call LCSW if there are transportation barriers to appointments.  Casimer Lanius, LCSW Licensed Clinical Social Worker [place]   [phone] 12:16 PM

## 2016-09-19 NOTE — Patient Outreach (Signed)
[facility]) Care Management  09/19/2016  PERSEUS WESTALL 1945/03/05 [id number]   Telephone Screen  Referral Date: 09/19/16 Referral Source: MD's office (Dr. Lorenda Peck) Referral Reason: Care Coordination and management of his chronic medical conditions Insurance: [facility] Medicare, [place]   Outreach attempt # 1, spoke with patient. Patient asked for a return phone call. He stated, "he is trying to get out of the rain".   Plan: RN CM will contact patient within 3 business days.  Lake Bells, RN, BSN, MHA/MSL, [place] Telephonic Care Manager Coordinator [facility] Direct Phone: [phone] Toll Free: [phone] Fax: [phone]

## 2016-09-19 NOTE — Patient Outreach (Signed)
[facility]) Care Management  09/19/2016  ORIS STAFFIERI 10-07-1945 [id number]   Telephone Screen  Referral Date: 09/19/16 Referral Source: MD's office (Dr. Lorenda Peck) Referral Reason: Care Coordination and management of his chronic medical conditions Insurance: [facility] Medicare, [place]  Outreach attempt # 2, incoming call from patient. HIPAA verified with patient. RN CM [place]) explained to patient a referral was received from his MD to assist him with management of his medical conditions.    Consent: [facility] services reviewed and discussed with patient. Verbal consent was not received.  Plan: RN CM will contact patient within 3 business days.  Lake Bells, RN, BSN, MHA/MSL, [place] Telephonic Care Manager Coordinator [facility] Direct Phone: [phone] Toll Free: [phone] Fax: [phone]

## 2016-09-20 ENCOUNTER — Telehealth: Payer: Self-pay | Admitting: Student

## 2016-09-20 ENCOUNTER — Encounter: Payer: Medicare Other | Admitting: Vascular Surgery

## 2016-09-20 ENCOUNTER — Telehealth: Payer: Self-pay | Admitting: Licensed Clinical Social Worker

## 2016-09-20 ENCOUNTER — Encounter (HOSPITAL_COMMUNITY): Payer: Medicare Other

## 2016-09-20 ENCOUNTER — Other Ambulatory Visit: Payer: Self-pay | Admitting: *Deleted

## 2016-09-20 ENCOUNTER — Ambulatory Visit (HOSPITAL_COMMUNITY)
Admission: RE | Admit: 2016-09-20 | Discharge: 2016-09-20 | Disposition: A | Discharge status: Home / Self Care | Payer: Medicare Other | Source: Ambulatory Visit | Attending: Cardiology | Admitting: Cardiology

## 2016-09-20 DIAGNOSIS — M7989 Other specified soft tissue disorders: Secondary | ICD-10-CM | POA: Diagnosis not present

## 2016-09-20 DIAGNOSIS — I82712 Chronic embolism and thrombosis of superficial veins of left upper extremity: Secondary | ICD-10-CM | POA: Diagnosis not present

## 2016-09-20 NOTE — Progress Notes (Signed)
LCSW received call from [place] with [facility], Patient is not open to [facility] services. Patient confused and thinks this is personal care services he was having difficulty understanding the difference.  Patient called LCSW, states he is ready for his medical appointment today. Currently waiting for [employer] transportation  During this call LCSW explain to patient the difference between personal care services and RN case management from [facility].  Patient is open to [facility] services.  Explained that Juanita would call him to set up a home visit. Patient understands and agrees to the phone call and visit. Updated provided to [place] with [place].    Casimer Lanius, LCSW Licensed Clinical Social Worker [place]   [phone] 10:58 AM

## 2016-09-20 NOTE — Telephone Encounter (Signed)
Order request for adult pull-ups signed by Dr. Mingo Amber and placed in outgoing fax.

## 2016-09-20 NOTE — Patient Outreach (Signed)
[facility]) Care Management  09/20/2016  VISHWA DAIS Nov 03, 1945 [id number]  Care Coordination  Phone call made to Liane Comber to communicate with patient regarding referral for [facility] services. Neoma Laming stated, patient has a scheduled appointment today. She will have the opportunity to speak with patient during his PCP appointment.  Message received from Neoma Laming, (see note).   Plan: RN CM willl follow-up with patient on Monday, 09/24/16.  Lake Bells, RN, BSN, MHA/MSL, [place] Telephonic Care Manager Coordinator [facility] Direct Phone: [phone] Toll Free: [phone] Fax: [phone]

## 2016-09-24 ENCOUNTER — Ambulatory Visit: Payer: Medicare Other | Admitting: Occupational Therapy

## 2016-09-24 ENCOUNTER — Other Ambulatory Visit: Payer: Self-pay | Admitting: *Deleted

## 2016-09-24 ENCOUNTER — Ambulatory Visit: Payer: Self-pay | Admitting: *Deleted

## 2016-09-24 NOTE — Patient Outreach (Signed)
[facility]) Care Management  09/24/2016  Tyler Lester 1945-03-16 [id number]   Telephone Screen  Referral Date: 09/19/16 Referral Source: MD's office (Dr. Lorenda Peck) Referral Reason: Care Coordination and management of his chronic medical conditions Insurance: [facility] Medicare, [place]  Outreach attempt # 3 to patient. Spoke with patient. HIPAA verified with patient.  Social:  Patient lives alone. He requires assistance with his ADLs. He is dependent with transportation to all medical appointments. He uses [employer] for majority of his medical appointments. Patient has a caregiver named Felicia. He uses a cane.  Conditions: Past Medical Hx:  Dementia, HTN, DM, Parkinson's Dx, CHF, Chronic Back Pain Patient reported, he has a hx of neuropathy, which he is prescribed Gabapentin. Patient stated, The medication causes him to sleep often. Patient voiced, "the medication affects his memory and he may forget things, at times". Patient has a hx of Parkinson's Dx, which limits his mobility. Patient had a prostatectomy and umbilical hernia repair on 03/07/16, per EMR. Patient voiced, he is having incontinent episodes, since the surgery. He reported, the MD is aware. Patient is having complications with transportation to his medical appointments. Per MD referral, he needs assistance with managing his chronic medical conditions. Patient's had multiple office visits for swelling in upper extremity. His last ED visit was 04/26/16 for chronic pain of right ankle.   Medications: Patient takes greater than 10 meds per day. He reported being able to afford his medications and taking them as prescribed.   Appointments:  On 09/20/16, patient had a vascular US of the upper left extremity (waiting on results).  Advanced Directives: Pt reported not having an Advanced Directive. He declined receiving information regarding Advanced Directives.  Consent: [facility] services reviewed and discussed with  patient. Verbal consent received.  Plan: RN CM will send referral to [facility] RN for further in home eval/assessment of care needs and management of chronic conditions. RN CM advised patient to contact RN CM for any needs or concerns.  Lake Bells, RN, BSN, MHA/MSL, [place] Telephonic Care Manager Coordinator [facility] Direct Phone: [phone] Toll Free: [phone] Fax: [phone]

## 2016-09-25 ENCOUNTER — Other Ambulatory Visit: Payer: Self-pay | Admitting: Student

## 2016-09-25 DIAGNOSIS — E1142 Type 2 diabetes mellitus with diabetic polyneuropathy: Secondary | ICD-10-CM

## 2016-09-25 DIAGNOSIS — R600 Localized edema: Secondary | ICD-10-CM

## 2016-09-25 NOTE — Addendum Note (Signed)
Addended by: Gabriel Cirri on: 09/25/2016 12:49 PM   Modules accepted: Orders

## 2016-09-26 ENCOUNTER — Encounter: Payer: Medicare Other | Admitting: Vascular Surgery

## 2016-09-26 ENCOUNTER — Telehealth: Payer: Self-pay | Admitting: Licensed Clinical Social Worker

## 2016-09-26 ENCOUNTER — Encounter (HOSPITAL_COMMUNITY): Payer: Medicare Other

## 2016-09-26 NOTE — Progress Notes (Addendum)
F/U call to patient to review transportation for his upcoming appointments.  Confirmed that patient's brother will take him to his appointments tomorrow 09/27/16 and patient will ride Medicaid Transportation to his appointment in [place] on 10/03/16.    Patient appreciative of the f/u call.  Plan: Patient will call LCSW if he has future transportation  barriers to his appointments.  Casimer Lanius, LCSW Licensed Clinical Social Worker [facility]   [phone] 3:05 PM

## 2016-09-27 ENCOUNTER — Ambulatory Visit (HOSPITAL_COMMUNITY)
Admission: RE | Admit: 2016-09-27 | Discharge: 2016-09-27 | Disposition: A | Discharge status: Home / Self Care | Payer: Medicare Other | Source: Ambulatory Visit | Attending: Vascular Surgery | Admitting: Vascular Surgery

## 2016-09-27 ENCOUNTER — Ambulatory Visit (INDEPENDENT_AMBULATORY_CARE_PROVIDER_SITE_OTHER): Payer: Medicare Other | Admitting: Vascular Surgery

## 2016-09-27 ENCOUNTER — Encounter: Payer: Self-pay | Admitting: Vascular Surgery

## 2016-09-27 VITALS — BP 155/87 | HR 68 | Temp 97.1°F | Resp 20 | Ht 69.0 in | Wt 209.0 lb

## 2016-09-27 DIAGNOSIS — I739 Peripheral vascular disease, unspecified: Secondary | ICD-10-CM

## 2016-09-27 DIAGNOSIS — R938 Abnormal findings on diagnostic imaging of other specified body structures: Secondary | ICD-10-CM | POA: Diagnosis not present

## 2016-09-27 DIAGNOSIS — E785 Hyperlipidemia, unspecified: Secondary | ICD-10-CM | POA: Insufficient documentation

## 2016-09-27 DIAGNOSIS — I10 Essential (primary) hypertension: Secondary | ICD-10-CM | POA: Diagnosis not present

## 2016-09-27 DIAGNOSIS — E1151 Type 2 diabetes mellitus with diabetic peripheral angiopathy without gangrene: Secondary | ICD-10-CM | POA: Diagnosis not present

## 2016-09-27 DIAGNOSIS — R0989 Other specified symptoms and signs involving the circulatory and respiratory systems: Secondary | ICD-10-CM | POA: Diagnosis present

## 2016-09-27 NOTE — Progress Notes (Signed)
Referring Physician: Dr Cyndia Skeeters  Patient name: Tyler Lester MRN: [id number] DOB: [birth year] Sex: male  REASON FOR CONSULT: Peripheral arterial disease  HPI: Tyler Lester is a 71 y.o. male referred for evaluation of peripheral arterial disease. The patient has had known long-term leg swelling. This improved slightly with diuretics. However, compression therapy has not been tried because of concerns about his peripheral arterial disease. The patient has fairly significant Parkinson's disease and does not really ambulate much. However, he does not really describe claudication symptoms. He denies rest pain. He does have symptoms of neuropathy with burning and numbness and tingling in both lower extremities extending from the knee down to the foot. He denies any history of nonhealing wounds. He states his neuropathy symptoms have improved a little bit with Neurontin. He states he feels his Parkinson's disease has gotten a little bit worse over the years. He has a tremor in his left hand and walks with a short shuffling gait and it has disabled him to the point that he is not really able to even dress himself without assistance. Other medical problems include diabetes hyperlipidemia hypertension all of which are currently controlled.  Past Medical History:  Diagnosis Date  . Bilateral foot pain   . Chronic pain   . Diabetes mellitus    type 2, diet controlled now  . Foley catheter in place since nov 2018, last changed 1 week ago  . Hyperlipidemia   . Hypertension   . Inguinal hernia    right  . Numbness    T/O  . Parkinson's disease ([place])   . Pneumonia yrs ago  . Shortness of breath    with exertion  . Urinary retention    Past Surgical History:  Procedure Laterality Date  . CATARACT EXTRACTION W/ INTRAOCULAR LENS IMPLANT Left 06/2012   laser for posterior capsule?  . COLON SURGERY    . POSTERIOR CERVICAL FUSION/FORAMINOTOMY N/A 11/12/2013   Procedure: Posterior cervical  decompression fusion, cervical 3-4, cervical 4-5, cervical 5-6, cervical 6-7 with instrumentation and allograft   (LEVEL 4);  Surgeon: Sinclair Ship, MD;  Location: [facility];  Service: Orthopedics;  Laterality: N/A;  Posterior cervical decompression fusion, cervical 3-4, cervical 4-5, cervical 5-6, cervical 6-7 with instrumentation and allograft  . TONSILLECTOMY    . XI ROBOTIC ASSISTED SIMPLE PROSTATECTOMY N/A 03/07/2016   Procedure: XI ROBOTIC ASSISTED SIMPLE PROSTATECTOMY AND UMBILICAL HERNIA REPAIR flexible cystoscopy;  Surgeon: Alexis Frock, MD;  Location: [facility];  Service: Urology;  Laterality: N/A;    Family History  Problem Relation Age of Onset  . Cancer - Other Other   . Diabetes Other   . Hypertension Other     SOCIAL HISTORY: Social History   Social History  . Marital status: Single    Spouse name: N/A  . Number of children: 2  . Years of education: 12   Occupational History  . Retired      Event organiser at [employer] History Main Topics  . Smoking status: Never Smoker  . Smokeless tobacco: Never Used  . Alcohol use 0.0 oz/week     Comment: 1 shot a day  . Drug use: No  . Sexual activity: Not on file   Other Topics Concern  . Not on file   Social History Narrative   Patient lives at home alone.    Patient have 2 children.    Patient has a high school education    Patient is right  handed.    Patient is retired on disability.        Allergies  Allergen Reactions  . Poison Ivy Treatments Hives    Pt denies allergy to this medication    Current Outpatient Prescriptions  Medication Sig Dispense Refill  . aspirin EC 81 MG tablet Take 1 tablet (81 mg total) by mouth daily. 30 tablet 11  . atorvastatin (LIPITOR) 20 MG tablet Take 1 tablet (20 mg total) by mouth daily. 90 tablet 3  . carbidopa-levodopa (SINEMET) 25-100 MG tablet Take 2 tablets by mouth 3 (three) times daily. Take 4 hours apart at 8am, noon and 4pm for example. Avoid high protein  diet. 180 tablet 5  . fluticasone (FLONASE) 50 MCG/ACT nasal spray Place 2 sprays into both nostrils daily. 16 g 5  . gabapentin (NEURONTIN) 300 MG capsule take 1 capsule by mouth twice a day 60 capsule 3  . HYDROcodone-acetaminophen (NORCO) 7.5-325 MG tablet Take 1 tablet by mouth every 8 (eight) hours as needed for moderate pain or severe pain. 90 tablet 0  . HYDROcodone-acetaminophen (NORCO) 7.5-325 MG tablet Take 1 tablet by mouth every 6 (six) hours as needed for moderate pain. 90 tablet 0  . HYDROcodone-acetaminophen (NORCO) 7.5-325 MG tablet Take 1 tablet by mouth every 6 (six) hours as needed for moderate pain. 90 tablet 0  . lisinopril (PRINIVIL,ZESTRIL) 20 MG tablet Take 1 tablet (20 mg total) by mouth daily. 90 tablet 3  . naloxone (NARCAN) 2 MG/2ML injection Inject 0.4 mLs (0.4 mg total) into the muscle as needed (opiod overdose. May repeat in two to three minutes.). 2 mL 1  . NONFORMULARY OR COMPOUNDED ITEM [facility]:  Peripheral Neuropathy cream - Bupivacaine 1%, Doxepin 3%, Gabapentin 6%, Pentoxifylline 3%, Topiramate 1%, apply 1-2 grams to affected area 3-4 times daily. 120 each 2  . polyethylene glycol powder (GLYCOLAX/MIRALAX) powder Take 17 g by mouth 2 (two) times daily as needed. 3350 g 1  . senna-docusate (SENOKOT-S) 8.6-50 MG tablet Take 1 tablet by mouth 2 (two) times daily. While taking strong pain meds to prevent constipation. 30 tablet 0  . SSD 1 % cream APPLY TOPICALLY DAILY 50 g 0  . torsemide (DEMADEX) 20 MG tablet take 1 tablet by mouth once daily 30 tablet 0   No current facility-administered medications for this visit.    Facility-Administered Medications Ordered in Other Visits  Medication Dose Route Frequency Provider Last Rate Last Dose  . magnesium citrate solution 1 Bottle  1 Bottle Oral Once Alexis Frock, MD        ROS:   General:  No weight loss, Fever, chills  HEENT: No recent headaches, no nasal bleeding, no visual changes, no sore  throat  Neurologic: No dizziness, blackouts, seizures. No recent symptoms of stroke or mini- stroke. No recent episodes of slurred speech, or temporary blindness.  Cardiac: No recent episodes of chest pain/pressure, no shortness of breath at rest.  + shortness of breath with exertion.  Denies history of atrial fibrillation or irregular heartbeat  Vascular: No history of rest pain in feet.  No history of claudication.  No history of non-healing ulcer, No history of DVT   Pulmonary: No home oxygen, no productive cough, no hemoptysis,  No asthma or wheezing  Musculoskeletal:  [X]  Arthritis, [ ]  Low back pain,  [X]  Joint pain  Hematologic:No history of hypercoagulable state.  No history of easy bleeding.  No history of anemia  Gastrointestinal: No hematochezia or melena,  No gastroesophageal reflux,  no trouble swallowing  Urinary: [ ]  chronic Kidney disease, [ ]  on HD - [ ]  MWF or [ ]  TTHS, [ ]  Burning with urination, [ ]  Frequent urination, [ ]  Difficulty urinating;   Skin: No rashes  Psychological: No history of anxiety,  No history of depression   Physical Examination  Vitals:   09/27/16 1015 09/27/16 1017  BP: (!) 155/90 (!) 155/87  Pulse: 68   Resp: 20   Temp: (!) 97.1 F (36.2 C)   TempSrc: Oral   SpO2: 100%   Weight: 209 lb (94.8 kg)   Height: 5\' 9"  (1.753 m)     Body mass index is 30.86 kg/m.  General:  Alert and oriented, no acute distress HEENT: Characteristic flap facial features of Parkinson's disease Neck: No bruit or JVD Pulmonary: Clear to auscultation bilaterally Cardiac: Regular Rate and Rhythm without murmur Abdomen: Soft, non-tender, non-distended, no mass Skin: No rash Extremity Pulses:  2+ radial, brachial, femoral, operative tibial absent dorsalis pedis, posterior tibial pulses bilaterally Musculoskeletal: No deformity trace bilateral pretibial left greater than right edema  Neurologic: Upper and lower extremity motor 5/5 and symmetric  DATA:   Patient had bilateral ABIs performed today which were 1.11 on the left 1.02 on the right triphasic waveforms. Toe pressure was 91 on the right 104 on the left  ASSESSMENT:  Although his noninvasive vascular testing was essentially normal. The patient's clinical exam does show some component of peripheral arterial disease with an abnormal pulse exam. He probably has at least some mild tibial disease from his diabetes. However, I do not believe that his peripheral arterial disease is severe enough to prevent him from compression therapy. I believe as Dr. Cyndia Skeeters has suggested he would benefit from this. I would not consider the any interventions for his peripheral arterial disease at this point since it is very mild and the patient overall is fairly debilitated. If he develops rest pain or nonhealing wounds we could reconsider this. Otherwise he will follow-up with [place] with repeat ABIs in 6 months time. I did give the patient a brochure today regarding compression stockings from elastic therapy. He has some concerns about whether or not he will be able to afford this. I'm also concerned that he may not be able to get the compression stockings on his feet since we had system placing his regular socks today. However, overall his vein symptoms are more nuisance symptoms then debilitating. I do not believe he currently is at risk of limb loss. He will follow-up with our nurse practitioner in 6 months.   PLAN:  See above   Ruta Hinds, MD [facility] Office: [phone] Pager: [phone]

## 2016-09-28 ENCOUNTER — Other Ambulatory Visit: Payer: Self-pay | Admitting: *Deleted

## 2016-09-28 NOTE — Patient Outreach (Signed)
[place] [facility]) Care Management  09/28/2016  Tyler Lester 09-Oct-1945 [id number]   Referral received from telephonic care manager to contact for home assessment and assistance with management of chronic conditions.  Per chart, he has history of hypertension, heart failure, peripheral artery disease, diabetes, and Parkinson disease.  Call placed to introduce [facility] services, he report this is not a good time to talk.  Request call back.  Will follow up next week.  Valente David, [place], MSN [place] [phone]

## 2016-10-02 ENCOUNTER — Encounter: Payer: Self-pay | Admitting: Student

## 2016-10-02 ENCOUNTER — Telehealth: Payer: Self-pay | Admitting: Neurology

## 2016-10-02 ENCOUNTER — Telehealth: Payer: Self-pay | Admitting: Student

## 2016-10-02 ENCOUNTER — Other Ambulatory Visit: Payer: Self-pay | Admitting: *Deleted

## 2016-10-02 DIAGNOSIS — Z789 Other specified health status: Secondary | ICD-10-CM | POA: Insufficient documentation

## 2016-10-02 DIAGNOSIS — M7989 Other specified soft tissue disorders: Secondary | ICD-10-CM

## 2016-10-02 DIAGNOSIS — M79602 Pain in left arm: Secondary | ICD-10-CM

## 2016-10-02 MED ORDER — PREGABALIN 75 MG PO CAPS: 75.0000 mg | ORAL_CAPSULE | Freq: Two times a day (BID) | ORAL | 1 refills | Status: DC

## 2016-10-02 NOTE — Telephone Encounter (Signed)
Pt has a new number to be called if necessary.  His left hand is really bothering him. It is really swollen. Please advise

## 2016-10-02 NOTE — Telephone Encounter (Signed)
Called patient in reference to scheduling an appointment with Dr. Jaynee Eagles for September 5th @ 11:30am with check in at 11:00am per RN Anderson Malta.  Patient did not answer and not voicemail if patient calls back please see if appointment works for him.  FYI

## 2016-10-02 NOTE — Addendum Note (Signed)
Addended by: Wendee Beavers T on: 10/02/2016 05:12 PM   Modules accepted: Orders

## 2016-10-02 NOTE — Telephone Encounter (Signed)
Tyler Lester states pt was told by PCP that pt is out of options and he doesn't know what else for him to do. Pt would like a referral to the [facility] ASAP. ep

## 2016-10-02 NOTE — Telephone Encounter (Signed)
Patient called office in reference to scheduling a fu visit with Dr. Jaynee Eagles due to increased tremors, left hand severely swollen "looks like its going to pop" per patient and in a lot of pain.  Symptoms has been going on for 2 weeks as patient has been following up with his PCP.  Patient last seen December 2017.  Are we able to work him in sooner than mid/late September?  [place].  Please call

## 2016-10-02 NOTE — Patient Outreach (Signed)
[place] [facility]) Care Management  10/02/2016  Tyler Lester Oct 08, 1945 [id number]   2nd attempt made to member to initiate [facility] care management services.  Identity verified, this care manager introduced self and purpose of call.  He state he has a Marine scientist that comes daily and places male on phone.  Male state that she is his personal care assistant, purpose of call explained as he requested this care manager to speak with her.  She then places member back on the phone, who refuses involvement at this time.  Benefits of [facility] explained, he again refuses involvement.  Will close case at this time and provide update to primary MD.  Valente David, RN, MSN [place] [phone]

## 2016-10-02 NOTE — Telephone Encounter (Signed)
Ambulatory referral to hand surgeons/specialist ordered.  Thanks!

## 2016-10-02 NOTE — Telephone Encounter (Signed)
Called and talked to patient about his hand and arm pain. This is a chronic issue. He says gabapentin and Norco could not control his pain. He states Lyrica helped better in the past.  I sent prescription for Lyrica 75 mg to his pharmacy. Advised him to stop gabapentin. Also warned him that Lyrica could make him sleepy and drowsy.

## 2016-10-03 ENCOUNTER — Ambulatory Visit: Payer: Medicare Other | Attending: Family Medicine | Admitting: Occupational Therapy

## 2016-10-03 ENCOUNTER — Encounter: Payer: Self-pay | Admitting: Occupational Therapy

## 2016-10-03 ENCOUNTER — Telehealth: Payer: Self-pay | Admitting: Student

## 2016-10-03 DIAGNOSIS — M6281 Muscle weakness (generalized): Secondary | ICD-10-CM | POA: Insufficient documentation

## 2016-10-03 DIAGNOSIS — R600 Localized edema: Secondary | ICD-10-CM | POA: Diagnosis not present

## 2016-10-03 DIAGNOSIS — M79602 Pain in left arm: Secondary | ICD-10-CM | POA: Diagnosis not present

## 2016-10-03 DIAGNOSIS — M79642 Pain in left hand: Secondary | ICD-10-CM | POA: Diagnosis not present

## 2016-10-03 NOTE — Telephone Encounter (Signed)
Will forward to PCP.  Zaidin Blyden L, RN  

## 2016-10-03 NOTE — Telephone Encounter (Signed)
Tried to call pt back but no answer. Left mssg per DPR on pt's cell offering appt on 10/31/16 at 11:30. He may call back to schedule.

## 2016-10-03 NOTE — Telephone Encounter (Signed)
Pt wants to make sure he is still on the hydrocodone for pain, he wants to keep taking it. Pt stated he doesn't want to take the gabapentin because it makes him sleepy. Pt wants PCP to call him. ep

## 2016-10-03 NOTE — Telephone Encounter (Signed)
Please refer to my recent phone conversation with the patient. I have changed his gabapentin to lyrica per his request. Yes, he is also on hydrocodone as well. Thanks! Bretta Bang

## 2016-10-03 NOTE — Telephone Encounter (Signed)
Pt informed of below. Zimmerman Rumple, [place] D, CMA  

## 2016-10-04 ENCOUNTER — Telehealth: Payer: Self-pay | Admitting: Licensed Clinical Social Worker

## 2016-10-04 ENCOUNTER — Telehealth: Payer: Self-pay | Admitting: *Deleted

## 2016-10-04 ENCOUNTER — Encounter: Payer: Self-pay | Admitting: Student

## 2016-10-04 DIAGNOSIS — Z7189 Other specified counseling: Secondary | ICD-10-CM | POA: Insufficient documentation

## 2016-10-04 NOTE — Telephone Encounter (Signed)
Tyler Lester from [facility] in [place] called stating patient prefers to have rehab in [place]. Discussed with referral coordinator and [place] is closest office where hand rehab can be done. Patient discussed this earlier today with in-house LCSW. Please see her note. Hubbard Hartshorn, RN, BSN

## 2016-10-04 NOTE — Progress Notes (Signed)
F/U to patient, ref. transportation to his appointment in [place] yesterday.  Patient reports having a good experience.  Reminded patient he has another appointment next week in [place] and he will need to call [employer] Transportation today in order to have the required window to schedule the ride.    Patient reports he is doing well, thanked LCSW for the reminder and stated he would call today before 12:00.  Also stated his aid [name] would assist him.  Casimer Lanius, LCSW Licensed Clinical Social Worker [place]   [phone] 9:06 AM

## 2016-10-04 NOTE — Therapy (Signed)
[facility] [address], [place], [address] Phone: [phone]   Fax:  [phone]  Occupational Therapy Evaluation  Patient Details  Name: Tyler Lester MRN: [id number] Date of Birth: 17-Jun-1953 Referring Provider: Andrena Mews  Encounter Date: 10/03/2016      OT End of Session - 10/04/16 1556    Visit Number 1   Number of Visits 12   Date for OT Re-Evaluation 11/14/16   Authorization Type Medicare G code 1   OT Start Time 1050   OT Stop Time 1152   OT Time Calculation (min) 62 min   Activity Tolerance Patient tolerated treatment well   Behavior During Therapy [facility] for tasks assessed/performed      Past Medical History:  Diagnosis Date  . Bilateral foot pain   . Chronic pain   . Diabetes mellitus    type 2, diet controlled now  . Foley catheter in place since nov 2018, last changed 1 week ago  . Hyperlipidemia   . Hypertension   . Inguinal hernia    right  . Numbness    T/O  . Parkinson's disease ([place])   . Pneumonia yrs ago  . Shortness of breath    with exertion  . Urinary retention     Past Surgical History:  Procedure Laterality Date  . CATARACT EXTRACTION W/ INTRAOCULAR LENS IMPLANT Left 06/2012   laser for posterior capsule?  . COLON SURGERY    . POSTERIOR CERVICAL FUSION/FORAMINOTOMY N/A 11/12/2013   Procedure: Posterior cervical decompression fusion, cervical 3-4, cervical 4-5, cervical 5-6, cervical 6-7 with instrumentation and allograft   (LEVEL 4);  Surgeon: Sinclair Ship, MD;  Location: [facility];  Service: Orthopedics;  Laterality: N/A;  Posterior cervical decompression fusion, cervical 3-4, cervical 4-5, cervical 5-6, cervical 6-7 with instrumentation and allograft  . TONSILLECTOMY    . XI ROBOTIC ASSISTED SIMPLE PROSTATECTOMY N/A 03/07/2016   Procedure: XI ROBOTIC ASSISTED SIMPLE PROSTATECTOMY AND UMBILICAL HERNIA REPAIR flexible cystoscopy;  Surgeon: Alexis Frock, MD;   Location: [facility];  Service: Urology;  Laterality: N/A;    There were no vitals filed for this visit.      Subjective Assessment - 10/03/16 1100    Subjective  Patient reports he is having pain from his left hand up to his elbow, left foot is also swollen.  Patient reports he had an ultrasound a couple of weeks ago which was negative.  Patient uses public transportation and comes from [place].    Pertinent History Patient has a history of Parkinson's disease and has a tremor noted in the left hand/arm.  He reports edema in his left UE since Oct/Nov of 2017 and then started to have pain in July 2018 worsening over time.  He reports some numbness and tingling in the LUE at times.  Patient denies any past surgeries to hand but has had a PICC line in the left arm in the past.  Recent US negative for DVT.     Patient Stated Goals Patient reports he would like to get rid of the pain and edema in his left UE so he can do things again for himself.    Currently in Pain? Yes   Pain Score 7    Pain Location Arm   Pain Orientation Left   Pain Descriptors / Indicators Aching   Pain Type Acute pain   Pain Onset More than a month ago   Pain Frequency Constant   Effect  of Pain on Daily Activities increased difficulty with completing self care tasks.     Multiple Pain Sites No           [facility] OT Assessment - 10/03/16 1101      Assessment   Diagnosis left hand pain and edema   Referring Provider Andrena Mews     Precautions   Precautions Fall     Balance Screen   Has the patient fallen in the past 6 months No   Has the patient had a decrease in activity level because of a fear of falling?  No   Is the patient reluctant to leave their home because of a fear of falling?  No     Home  Environment   Family/patient expects to be discharged to: Private residence   Living Arrangements Alone   Available Help at Discharge Family   Type of [place] One  level   Bathroom Shower/Tub Tub/Shower unit;Curtain   [place] - single point;Walker - 4 wheels;Bedside commode   Lives With Alone     Prior Function   Level of Independence Needs assistance with ADLs;Needs assistance with homemaking   Vocation Retired   Comments has a friend to come in and help him 7 days a week     ADL   Eating/Feeding Modified independent   Grooming Modified independent   Upper Body Bathing Minimal assistance   Lower Body Bathing Moderate assistance   Upper Body Dressing Minimal assistance   Lower Body Dressing Moderate assistance   Toilet Transfer Modified independent   Toileting - Clothing Manipulation Modified independent   Toileting -  Hygiene Modified Independent   Tub/Shower Transfer Moderate assistance     IADL   Prior Level of Function Shopping independent   Shopping Needs to be accompanied on any shopping trip   Prior Level of Function Light Housekeeping independent   Light Housekeeping Does not participate in any housekeeping tasks   Prior Level of Function Meal Prep independent   Meal Prep Needs to have meals prepared and served   [employer] independently on public transportation   Medication Management Takes responsibility if medication is prepared in advance in seperate dosage;Has difficulty remembering to take medication   Prior Level of Function Financial Management independent   Financial Management Requires assistance     Mobility   Mobility Status Needs assist   Mobility Status Comments uses cane most of the time but has a rollator when going longer distances     Written Expression   Dominant Hand Right     Vision - History   Baseline Vision Wears glasses only for reading     Cognition   Memory Impaired     ROM / Strength   AROM / PROM / Strength AROM;Strength     AROM   AROM Assessment Site Shoulder   Right/Left Shoulder Left   Left Shoulder Flexion 105 Degrees    Left Shoulder ABduction 90 Degrees   Left Shoulder External Rotation --  limited to reach back of head   Right/Left Elbow Left   Left Elbow Flexion 130   Left Elbow Extension -8   Right/Left Forearm Left   Left Forearm Pronation 90 Degrees   Left Forearm Supination 80 Degrees   Right/Left Wrist Left   Left Wrist Extension 35 Degrees   Left Wrist Flexion 20 Degrees   Left Thumb Opposition Digit  3  to the lateral side of the RF, unable to small finger     Left Hand AROM   L Thumb MCP 0-60 35 Degrees   L Thumb IP 0-80 35 Degrees   L Index  MCP 0-90 85 Degrees   L Index PIP 0-100 95 Degrees   L Index DIP 0-70 70 Degrees   L Long  MCP 0-90 85 Degrees   L Long PIP 0-100 95 Degrees   L Long DIP 0-70 40 Degrees   L Ring  MCP 0-90 40 Degrees   L Ring PIP 0-100 80 Degrees   L Ring DIP 0-70 50 Degrees   L Little  MCP 0-90 0 Degrees   L Little PIP 0-100 0 Degrees   L Little DIP 0-70 0 Degrees      Patient with pitting edema in left UE, circumferential measurements as follows:   Right 7.8 cm  thumb  Left 8.7 cm Right 7.5 cm index Left 8.3 cm Right  22.2 cm dorsum of wrist Left 24.2 Right 18.3 cm wrist Left 19.8 Right  22.8 cm at 10 cm forearm Left 23.6 cm Right  28.5 cm at 15 cm forearm Left 28.2 cm   Patient seen for contrast to LUE hand and wrist, 3 mins warm, 1 min cold for 2 cycles for edema control. Patient seen for tendon gliding exercises with cues and instructions.  Thumb flexion, radial and palmar ABD, wrist flexion/extension Issued and instructed on isotoner glove for left in medium with open fingers to wear for edema control. Patient was educated on positioning for edema control at home.               OT Education - 10/04/16 1555    Education provided Yes   Education Details HEP, contrast bath for edema control, positioning and retrograde massage.   Person(s) Educated Patient   Methods Explanation;Demonstration;Verbal cues   Comprehension Verbal cues  required;Returned demonstration;Verbalized understanding             OT Long Term Goals - 10/04/16 1656      OT LONG TERM GOAL #1   Title Patient will be independent with home program for edema control and ROM of left UE.    Baseline no current program   Time 6   Period Weeks   Status New   Target Date 11/14/16     OT LONG TERM GOAL #2   Title Patient will decrease pain in left UE to 2/10 or less to use hand in functional tasks.   Baseline pain    Time 6   Period Weeks   Status New   Target Date 11/14/16     OT LONG TERM GOAL #3   Title Patient will demonstrate understanding of positioning, contrast bath for edema control measures    Baseline no current program   Time 2   Period Weeks   Status New   Target Date 10/17/16     OT LONG TERM GOAL #4   Title Patient will complete dressing tasks with use of left hand incorporated into task with occasional min assist.    Baseline moderate assist   Time 6   Period Weeks   Status New   Target Date 11/14/16               Plan - 10/04/16 1557    Clinical Impression Statement Patient is a 71 yo male who reports left hand pain up to the elbow and edema.  He  had an ultrasound in the last couple weeks which was negative for DVT.  He has a history of a PICC line in his left arm which MD indicates may be source of pain and edema.  Patient also has a history of Parkinson's disease and has tremors noted in left UE and reports pain in his left wrist with extension.  Patient presents with significant edema in his left hand and wrist and decreased functional use of his left hand.  He had a prior nerve injury when he was in his teens affecting his left small finger. Patient would benefit from skilled OT to decrease pain in left UE, decrease edema and increase functional use for improved independence in daily tasks.     Occupational Profile and client history currently impacting functional performance Parkinson's disease with left hand  tremors, previous nerve injury to left small finger, previous PICC line in LUE, patient has issues with transportation to appts, lives in [place].    Occupational performance deficits (Please refer to evaluation for details): ADL's;IADL's;Rest and Sleep;Leisure   Rehab Potential Good   Current Impairments/barriers affecting progress: age, comorbidities   OT Frequency 2x / week   OT Duration 6 weeks   OT Treatment/Interventions Self-care/ADL training;Moist Heat;Fluidtherapy;DME and/or AE instruction;Splinting;Patient/family education;Manual lymph drainage;Contrast Bath;Therapeutic exercises;Ultrasound;Therapeutic exercise;Therapeutic activities;Cryotherapy;Passive range of motion;Parrafin;Manual Therapy   Clinical Decision Making Several treatment options, min-mod task modification necessary   Consulted and Agree with Plan of Care Patient      Patient will benefit from skilled therapeutic intervention in order to improve the following deficits and impairments:  Decreased coordination, Decreased range of motion, Difficulty walking, Impaired flexibility, Decreased endurance, Increased edema, Impaired sensation, Impaired UE functional use, Pain, Decreased mobility, Decreased strength  Visit Diagnosis: Pain in left arm  Pain in left hand  Muscle weakness (generalized)  Localized edema      G-Codes - 2016/10/24 1558    Functional Assessment Tool Used (Outpatient only) clinical judgment, ROM, strength, pain scale   Functional Limitation Self care   Self Care Current Status (F6812) At least 60 percent but less than 80 percent impaired, limited or restricted   Self Care Goal Status (X5170) At least 40 percent but less than 60 percent impaired, limited or restricted      Problem List Patient Active Problem List   Diagnosis Date Noted  . Coordination of complex care 10/04/2016  . Enrolled in chronic care management 10/02/2016  . PAD (peripheral artery disease) ([place]) 07/28/2016  . Leg  swelling 07/27/2016  . Routine adult health maintenance 06/18/2016  . Diabetic polyneuropathy associated with type 2 diabetes mellitus ([place]) 04/13/2016  . BPH (benign prostatic hyperplasia) 03/07/2016  . Diminished hearing, left 01/20/2016  . Type 2 diabetes mellitus with complication ([place]) 01/74/9449  . Chronic back pain 09/16/2015  . Parkinson disease ([place]) 08/26/2015  . Rhinitis, allergic 08/25/2015  . Dyspnea 06/22/2015  . Shortness of breath 03/28/2015  . Gait abnormality 03/28/2015  . Left arm swelling 11/11/2014  . Postlaminectomy syndrome, cervical region 07/23/2014  . 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 Back pain with sciatica 05/27/2014  . Bilateral foot pain 05/14/2014  . Myelopathy ([place]) 11/12/2013  . Depression 10/15/2013  . CKD Stage 2 08/25/2013  . Cervical spine degeneration 07/13/2013  . Tremor 05/23/2013  . Chronic diastolic heart failure ([place]) 05/06/2013  . Knee pain, bilateral 12/12/2012  . Nail dystrophy 05/14/2012  . GERD (gastroesophageal reflux disease) 07/04/2011  . Tinea pedis 01/26/2011  . 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  Prediabetes 01/05/2011  . Essential hypertension 01/05/2011   Juliauna Stueve T Chaquetta Schlottman, OTR/L, CLT  Cherica Heiden 10/04/2016, 5:00 PM  [facility] [address] [place], [address] Phone: [phone]   Fax:  [phone]  Name: TANAV ORSAK MRN: [id number] Date of Birth: 13-Mar-1945

## 2016-10-08 ENCOUNTER — Other Ambulatory Visit: Payer: Self-pay | Admitting: *Deleted

## 2016-10-08 DIAGNOSIS — M549 Dorsalgia, unspecified: Secondary | ICD-10-CM

## 2016-10-08 DIAGNOSIS — G8929 Other chronic pain: Secondary | ICD-10-CM

## 2016-10-08 DIAGNOSIS — M5442 Lumbago with sciatica, left side: Principal | ICD-10-CM

## 2016-10-08 DIAGNOSIS — M5441 Lumbago with sciatica, right side: Principal | ICD-10-CM

## 2016-10-08 DIAGNOSIS — M5430 Sciatica, unspecified side: Secondary | ICD-10-CM

## 2016-10-08 NOTE — Telephone Encounter (Signed)
Patient requesting refill on pain med. First available appt with PCP given for 8/24. Patient will be out of med by 8/20.  Hubbard Hartshorn, RN, BSN

## 2016-10-09 ENCOUNTER — Other Ambulatory Visit: Payer: Self-pay | Admitting: Student

## 2016-10-09 DIAGNOSIS — M549 Dorsalgia, unspecified: Secondary | ICD-10-CM

## 2016-10-09 DIAGNOSIS — M5442 Lumbago with sciatica, left side: Principal | ICD-10-CM

## 2016-10-09 DIAGNOSIS — M5430 Sciatica, unspecified side: Secondary | ICD-10-CM

## 2016-10-09 DIAGNOSIS — M5441 Lumbago with sciatica, right side: Principal | ICD-10-CM

## 2016-10-09 DIAGNOSIS — G8929 Other chronic pain: Secondary | ICD-10-CM

## 2016-10-09 NOTE — Telephone Encounter (Signed)
Pt called to check the status of his refill request on his hydrocodone. Please let patient know. jw

## 2016-10-09 NOTE — Telephone Encounter (Signed)
Called and talked to patient. Patient has filled two of the three prescriptions I gave him so far. He should have another prescription on file at the pharmacy that could be filled on 10/15/2016.

## 2016-10-09 NOTE — Telephone Encounter (Signed)
2nd request. ep °

## 2016-10-10 ENCOUNTER — Encounter: Payer: Medicare Other | Admitting: Occupational Therapy

## 2016-10-11 DIAGNOSIS — M79642 Pain in left hand: Secondary | ICD-10-CM | POA: Diagnosis not present

## 2016-10-12 ENCOUNTER — Telehealth: Payer: Self-pay | Admitting: *Deleted

## 2016-10-12 NOTE — Telephone Encounter (Signed)
[name] (case worker) is requesting an order for a hospital bed for this patient.  She states with his CHF and worsening parkinsons this bed would be a major help for Mr. Tyler Lester. [name] says you can fax the order to her @ [phone]. Yesha Muchow, Salome Spotted, CMA

## 2016-10-15 ENCOUNTER — Other Ambulatory Visit: Payer: Self-pay | Admitting: Student

## 2016-10-15 DIAGNOSIS — M5430 Sciatica, unspecified side: Secondary | ICD-10-CM

## 2016-10-15 DIAGNOSIS — G20 Parkinson's disease: Secondary | ICD-10-CM

## 2016-10-15 DIAGNOSIS — M549 Dorsalgia, unspecified: Secondary | ICD-10-CM

## 2016-10-15 DIAGNOSIS — G20A1 Parkinson's disease without dyskinesia, without mention of fluctuations: Secondary | ICD-10-CM

## 2016-10-15 DIAGNOSIS — I5032 Chronic diastolic (congestive) heart failure: Secondary | ICD-10-CM

## 2016-10-15 NOTE — Telephone Encounter (Signed)
Order printed by Dr. Cyndia Skeeters, signed by Dr. Gwendlyn Deutscher and placed in outgoing fax box.

## 2016-10-15 NOTE — Progress Notes (Signed)
Patient with CHF, worsening Parkinson disease and chronic back pain. Ordered hospital bed per caseworker's recommendation and faxed to [phone]

## 2016-10-17 NOTE — Addendum Note (Signed)
Addended by: Lianne Cure A on: 10/17/2016 12:28 PM   Modules accepted: Orders

## 2016-10-19 ENCOUNTER — Ambulatory Visit: Payer: Medicare Other | Admitting: Student

## 2016-10-23 ENCOUNTER — Other Ambulatory Visit: Payer: Self-pay | Admitting: Student

## 2016-10-23 DIAGNOSIS — G8929 Other chronic pain: Secondary | ICD-10-CM

## 2016-10-23 DIAGNOSIS — M5442 Lumbago with sciatica, left side: Principal | ICD-10-CM

## 2016-10-23 DIAGNOSIS — M5441 Lumbago with sciatica, right side: Principal | ICD-10-CM

## 2016-10-23 DIAGNOSIS — M5430 Sciatica, unspecified side: Secondary | ICD-10-CM

## 2016-10-23 DIAGNOSIS — M549 Dorsalgia, unspecified: Secondary | ICD-10-CM

## 2016-10-30 ENCOUNTER — Ambulatory Visit: Payer: Medicare Other | Admitting: Student

## 2016-11-05 ENCOUNTER — Other Ambulatory Visit: Payer: Self-pay | Admitting: Student

## 2016-11-05 ENCOUNTER — Ambulatory Visit (INDEPENDENT_AMBULATORY_CARE_PROVIDER_SITE_OTHER): Payer: Medicare Other | Admitting: Student

## 2016-11-05 ENCOUNTER — Encounter: Payer: Self-pay | Admitting: Student

## 2016-11-05 VITALS — BP 130/70 | HR 77 | Temp 98.0°F | Ht 69.0 in | Wt 207.0 lb

## 2016-11-05 DIAGNOSIS — I739 Peripheral vascular disease, unspecified: Secondary | ICD-10-CM

## 2016-11-05 DIAGNOSIS — G20 Parkinson's disease: Secondary | ICD-10-CM

## 2016-11-05 DIAGNOSIS — M5430 Sciatica, unspecified side: Secondary | ICD-10-CM | POA: Diagnosis not present

## 2016-11-05 DIAGNOSIS — I10 Essential (primary) hypertension: Secondary | ICD-10-CM

## 2016-11-05 DIAGNOSIS — M5442 Lumbago with sciatica, left side: Secondary | ICD-10-CM | POA: Diagnosis not present

## 2016-11-05 DIAGNOSIS — G8929 Other chronic pain: Secondary | ICD-10-CM | POA: Diagnosis not present

## 2016-11-05 DIAGNOSIS — E785 Hyperlipidemia, unspecified: Secondary | ICD-10-CM | POA: Insufficient documentation

## 2016-11-05 DIAGNOSIS — M5441 Lumbago with sciatica, right side: Secondary | ICD-10-CM

## 2016-11-05 DIAGNOSIS — M549 Dorsalgia, unspecified: Secondary | ICD-10-CM

## 2016-11-05 MED ORDER — ONE DAILY MENS HEALTH PO TABS: 1.0000 | ORAL_TABLET | Freq: Every day | ORAL | 3 refills | Status: DC

## 2016-11-05 MED ORDER — HYDROCODONE-ACETAMINOPHEN 7.5-325 MG PO TABS: 1.0000 | ORAL_TABLET | Freq: Three times a day (TID) | ORAL | 0 refills | Status: DC | PRN

## 2016-11-05 MED ORDER — HYDROCODONE-ACETAMINOPHEN 7.5-325 MG PO TABS: 1.0000 | ORAL_TABLET | Freq: Four times a day (QID) | ORAL | 0 refills | Status: DC | PRN

## 2016-11-05 NOTE — Patient Instructions (Signed)
It was great seeing you today! We have addressed the following issues today 1. Back and leg pain: we have given him 3 prescriptions for your Norco for the next 3 months. Keep up exercise! 2.   Parkinson disease: we have sent a referral to neurologist. Someone will get in touch with you about this in the next couple of weeks.  If we did any lab work today, and the results require attention, either me or my nurse will get in touch with you. If everything is normal, you will get a letter in mail and a message via . If you don't hear from [place] in two weeks, please give [place] a call. Otherwise, we look forward to seeing you again at your next visit. If you have any questions or concerns before then, please call the clinic at [phone].  Please bring all your medications to every doctors visit  Sign up for My Chart to have easy access to your labs results, and communication with your Primary care physician.    Please check-out at the front desk before leaving the clinic.    Take Care,   Dr. Cyndia Skeeters

## 2016-11-05 NOTE — Progress Notes (Signed)
Subjective:    Tyler Lester is a 71 y.o. old male here for his pain medication refill  HPI Chronic back pain: patient with cervical spin degeneration with stenosis and myelopathy s/p s/p posterior spinal decompression and fusion C3-4 through C6-7 performed 11/12/2013 by Dr. Phylliss Lester. Also MRI lumbar spine with lateral recess and foraminal stenosis at  L3-4 and  L4-5 & L5-S1. He also has bad PAD and neuropathy bilaterally.  Today, he denies changes in the nature of his pain. Pain medication is helping and enabling him to be more active. He says he still goes to [facility]. He walks, ride a bike and rows.  Denies fever, bowel and bladder issues, numbness or tingling in his legs. No recent fall. He has a locker for his pain medication. He has narcan. He took his medication today. He got some left at home. Denies smoking or recreational drug use  Parkinson disease: patient with known parkinson disease. He is on sinemet for a while. 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; CKD Stage 2; Depression; Bilateral foot pain; Back pain with sciatica; Postlaminectomy syndrome, cervical region; Left arm swelling; Gait abnormality; Rhinitis, allergic; Parkinson disease (Tyler Lester); Chronic back pain; Type 2 diabetes mellitus with complication (Tyler Lester); Diminished hearing, left; BPH with Refracotry Urinary Retention; Diabetic polyneuropathy associated with type 2 diabetes mellitus (Tyler Lester); Routine adult health maintenance; Leg swelling; PAD (peripheral artery disease) (Tyler Lester); Enrolled in chronic care management; and Hyperlipidemia on his problem list.   has a past medical history of Bilateral foot pain; Chronic pain; Diabetes mellitus; Foley catheter in place (since nov 2018,  last changed 1 week ago); Hyperlipidemia; Hypertension; Inguinal hernia; Numbness; Parkinson's disease (Tyler Lester); Pneumonia (yrs ago); Shortness of breath; and Urinary retention.  FH:  Family History  Problem Relation Age of Onset  . Cancer - Other Other   . Diabetes Other   . Hypertension Other     Tyler Lester Social History  Substance Use Topics  . Smoking status: Never Smoker  . Smokeless tobacco: Never Used  . Alcohol use 0.0 oz/week     Comment: 1 shot a day    Review of Systems Review of systems negative except for pertinent positives and negatives in history of present illness above.     Objective:     Vitals:   11/05/16 1634  BP: 130/70  Pulse: 77  Temp: 98 F (36.7 C)  TempSrc: Oral  SpO2: 99%  Weight: 207 lb (93.9 kg)  Height: 5\' 9"  (1.753 m)   Body mass index is 30.57 kg/m.  Physical Exam GEN: appears well, no apparent distress. CVS: poor circulation in both legs (chronic) RESP: no IWOB MSK: no focal tenderness. Some swelling in his legs from PAD. Motor 5/5 in both lower extremities. Light sensation diminished in both legs (chronic neuropathy). Resting tremor, cog wheel rigidity and shuffling gait. Resting tremor in both legs. SKIN: shiny skin both legs bilaterally NEURO: alert and oiented appropriately. The rest as above PSYCH: euthymic mood with congruent affect.    Assessment and Plan:  1. Chronic back pain and leg pain: Stable. Combination of some radiculopathy, PAD and neuropathy. No red flags for Caudae equina or malignancy. He is keeping his meds locked. Has Narcan at home. No recent fall. He is trying to be very active. Tyler Lester Narcotic data base reviewed and  no red flag noted.  - NORCO 7.5-325 MG q8h PRN modto severe pain, #90. Gave 3 Rxs for the next 3 months.  - ToxASSURE Select 13 (MW), Urine.   2. Parkinson disease [facility]): he is on sinemet for a while. He says he hasn't seen neurologist in awhile. Exam with resting tremor, shuffling gait and cog wheel  rigidity -Continue Sinemet - Ambulatory referral to Neurology  Return in about 3 months (around 02/04/2017) for Pain.  Mercy Riding, MD 11/05/16 Pager: [phone]

## 2016-11-06 DIAGNOSIS — M79642 Pain in left hand: Secondary | ICD-10-CM | POA: Diagnosis not present

## 2016-11-06 DIAGNOSIS — G5602 Carpal tunnel syndrome, left upper limb: Secondary | ICD-10-CM | POA: Insufficient documentation

## 2016-11-06 DIAGNOSIS — G5622 Lesion of ulnar nerve, left upper limb: Secondary | ICD-10-CM | POA: Insufficient documentation

## 2016-11-09 ENCOUNTER — Encounter: Payer: Self-pay | Admitting: Student

## 2016-11-09 DIAGNOSIS — R413 Other amnesia: Secondary | ICD-10-CM | POA: Insufficient documentation

## 2016-11-09 LAB — TOXASSURE SELECT 13 (MW), URINE

## 2016-11-20 ENCOUNTER — Telehealth: Payer: Self-pay | Admitting: Student

## 2016-11-20 NOTE — Telephone Encounter (Signed)
Has had a cold for about a week. Would like to get some cough medicine with codiene.  He feels it is just sitting in his throat.  [employer] on [employer]

## 2016-11-21 NOTE — Telephone Encounter (Signed)
Patient has appointment tomorrow. Will discuss this at that visit

## 2016-11-22 ENCOUNTER — Ambulatory Visit: Payer: Medicare Other | Admitting: Student

## 2016-12-11 ENCOUNTER — Ambulatory Visit: Payer: Medicare Other | Admitting: Student

## 2016-12-17 ENCOUNTER — Other Ambulatory Visit: Payer: Self-pay | Admitting: Student

## 2016-12-17 ENCOUNTER — Other Ambulatory Visit: Payer: Self-pay | Admitting: Neurology

## 2016-12-17 DIAGNOSIS — M79602 Pain in left arm: Secondary | ICD-10-CM

## 2016-12-17 DIAGNOSIS — M7989 Other specified soft tissue disorders: Secondary | ICD-10-CM

## 2016-12-18 ENCOUNTER — Ambulatory Visit (INDEPENDENT_AMBULATORY_CARE_PROVIDER_SITE_OTHER): Payer: Medicare Other | Admitting: Family Medicine

## 2016-12-18 ENCOUNTER — Encounter: Payer: Self-pay | Admitting: Family Medicine

## 2016-12-18 VITALS — BP 132/80 | HR 78 | Temp 97.7°F | Ht 69.0 in | Wt 207.0 lb

## 2016-12-18 DIAGNOSIS — L989 Disorder of the skin and subcutaneous tissue, unspecified: Secondary | ICD-10-CM | POA: Diagnosis not present

## 2016-12-18 DIAGNOSIS — R059 Cough, unspecified: Secondary | ICD-10-CM

## 2016-12-18 DIAGNOSIS — J029 Acute pharyngitis, unspecified: Secondary | ICD-10-CM

## 2016-12-18 DIAGNOSIS — R05 Cough: Secondary | ICD-10-CM | POA: Diagnosis not present

## 2016-12-18 LAB — POCT RAPID STREP A (OFFICE): Rapid Strep A Screen: NEGATIVE

## 2016-12-18 NOTE — Patient Instructions (Signed)
It was nice seeing you today. You strep test is neg. Unclear the cause of your throat pain. It could be due to chronic cough. We will check xray to ensure no mole infection. Come back and see Dr. Cyndia Skeeters in 2 weeks for reassessment. If no improvement you will need ENT referral. We also made appointment at the [facility] clinic for your leg rash. Call if you have any question.

## 2016-12-18 NOTE — Progress Notes (Signed)
Subjective:     Patient ID: Tyler Lester, male   DOB: [birth year], 71 y.o.   MRN: [id number]  Sore Throat   This is a new problem. The problem has been gradually worsening. There has been no fever. The pain is at a severity of 9/10. The pain is moderate. Associated symptoms include coughing, a hoarse voice and shortness of breath. Pertinent negatives include no abdominal pain, congestion, ear discharge, ear pain, trouble swallowing or vomiting. Associated symptoms comments: He coughs on and off as well. He said he feels he has been exposed to mole at home. No fever, no chest pain.Marland Kitchen He has had no exposure to strep or mono. Treatments tried: OTC robitussin.  Rash  This is a new problem. Episode onset: left leg rash x 6 months, started off with itching. The problem has been gradually worsening since onset. Location: rash on left LL. The rash is characterized by itchiness and dryness. He was exposed to nothing. Associated symptoms include coughing and shortness of breath. Pertinent negatives include no congestion or vomiting. Past treatments include nothing. The treatment provided no relief.   Current Outpatient Prescriptions on File Prior to Visit  Medication Sig Dispense Refill  . aspirin EC 81 MG tablet Take 1 tablet (81 mg total) by mouth daily. 30 tablet 11  . atorvastatin (LIPITOR) 20 MG tablet Take 1 tablet (20 mg total) by mouth daily. 90 tablet 3  . carbidopa-levodopa (SINEMET) 25-100 MG tablet Take 2 tablets by mouth 3 (three) times daily. Take 4 hours apart at 8am, noon and 4pm for example. Avoid high protein diet. 180 tablet 5  . fluticasone (FLONASE) 50 MCG/ACT nasal spray Place 2 sprays into both nostrils daily. 16 g 5  . HYDROcodone-acetaminophen (NORCO) 7.5-325 MG tablet Take 1 tablet by mouth every 8 (eight) hours as needed for moderate pain. 90 tablet 0  . HYDROcodone-acetaminophen (NORCO) 7.5-325 MG tablet Take 1 tablet by mouth every 6 (six) hours as needed for moderate pain. 90 tablet  0  . HYDROcodone-acetaminophen (NORCO) 7.5-325 MG tablet Take 1 tablet by mouth every 8 (eight) hours as needed for moderate pain or severe pain. 90 tablet 0  . lisinopril (PRINIVIL,ZESTRIL) 20 MG tablet Take 1 tablet (20 mg total) by mouth daily. 90 tablet 3  . Multiple Vitamins-Minerals (ONE DAILY MENS HEALTH) TABS Take 1 tablet by mouth daily. 90 tablet 3  . naloxone (NARCAN) 2 MG/2ML injection Inject 0.4 mLs (0.4 mg total) into the muscle as needed (opiod overdose. May repeat in two to three minutes.). 2 mL 1  . NONFORMULARY OR COMPOUNDED ITEM [facility]:  Peripheral Neuropathy cream - Bupivacaine 1%, Doxepin 3%, Gabapentin 6%, Pentoxifylline 3%, Topiramate 1%, apply 1-2 grams to affected area 3-4 times daily. 120 each 2  . polyethylene glycol powder (GLYCOLAX/MIRALAX) powder Take 17 g by mouth 2 (two) times daily as needed. 3350 g 1  . pregabalin (LYRICA) 75 MG capsule Take 1 capsule (75 mg total) by mouth 2 (two) times daily. 60 capsule 1  . senna-docusate (SENOKOT-S) 8.6-50 MG tablet Take 1 tablet by mouth 2 (two) times daily. While taking strong pain meds to prevent constipation. 30 tablet 0  . SSD 1 % cream APPLY TOPICALLY DAILY 50 g 0  . torsemide (DEMADEX) 20 MG tablet take 1 tablet by mouth once daily 30 tablet 0   Current Facility-Administered Medications on File Prior to Visit  Medication Dose Route Frequency Provider Last Rate Last Dose  . magnesium citrate solution 1 Bottle  1 Bottle Oral Once Alexis Frock, MD       Past Medical History:  Diagnosis Date  . Bilateral foot pain   . Chronic pain   . Diabetes mellitus    type 2, diet controlled now  . Foley catheter in place since nov 2018, last changed 1 week ago  . Hyperlipidemia   . Hypertension   . Inguinal hernia    right  . Numbness    T/O  . Parkinson's disease ([place])   . Pneumonia yrs ago  . Shortness of breath    with exertion  . Urinary retention    Vitals:   12/18/16 0951  BP: 132/80  Pulse: 78    Temp: 97.7 F (36.5 C)  TempSrc: Oral  SpO2: 96%  Weight: 207 lb (93.9 kg)  Height: 5\' 9"  (1.753 m)     Review of Systems  HENT: Positive for hoarse voice. Negative for congestion, ear discharge, ear pain and trouble swallowing.   Respiratory: Positive for cough and shortness of breath.   Cardiovascular: Negative.   Gastrointestinal: Negative for abdominal pain and vomiting.  Skin: Positive for rash.  All other systems reviewed and are negative.      Objective:   Physical Exam  Constitutional: He is oriented to person, place, and time. He appears well-developed. No distress.  HENT:  Head: Normocephalic.  Right Ear: Tympanic membrane, external ear and ear canal normal.  Left Ear: Tympanic membrane, external ear and ear canal normal.  Mouth/Throat: Uvula is midline, oropharynx is clear and moist and mucous membranes are normal.  Cardiovascular: Normal rate, regular rhythm and normal heart sounds.   No murmur heard. Pulmonary/Chest: Effort normal and breath sounds normal. No respiratory distress. He has no wheezes. He has no rales. He exhibits no tenderness.  Musculoskeletal: Normal range of motion. He exhibits no edema.  Neurological: He is alert and oriented to person, place, and time.  Skin:     Nursing note and vitals reviewed.        Assessment:     Throat pain Cough Skin lesion    Plan:     1. ENT exam benign.     Rapid strep neg.     Throat pain might be due to cough.     Use Tylenol as needed for pain.     He will f/u with his PCP in 1-2 weeks, if no improvement to consider ENT referral.     He agreed with plan.  2. Cough on going for more than 1 month. ?? Allergy related cough.     He endorsed exposure to mole.     No fever, pulm exam benign.     Chest xray ordered to further evaluate.     Return to PCP in 1-2 weeks if no improvement for further evaluation.     He agreed with plan.  3. Skin lesion: Etiology unclear. ?? Psoriasis.     Skin biopsy  recommended.     He will return to [facility] clinic in about 2 weeks.     F/U sooner if symptoms worsens.

## 2016-12-28 ENCOUNTER — Other Ambulatory Visit: Payer: Self-pay | Admitting: Student

## 2016-12-28 ENCOUNTER — Encounter: Payer: Self-pay | Admitting: Student

## 2016-12-28 ENCOUNTER — Ambulatory Visit (INDEPENDENT_AMBULATORY_CARE_PROVIDER_SITE_OTHER): Payer: Medicare Other | Admitting: Student

## 2016-12-28 VITALS — BP 136/82 | HR 82 | Temp 97.6°F | Ht 69.0 in | Wt 204.2 lb

## 2016-12-28 DIAGNOSIS — M7989 Other specified soft tissue disorders: Secondary | ICD-10-CM | POA: Diagnosis not present

## 2016-12-28 DIAGNOSIS — Z23 Encounter for immunization: Secondary | ICD-10-CM | POA: Diagnosis not present

## 2016-12-28 DIAGNOSIS — R0982 Postnasal drip: Secondary | ICD-10-CM | POA: Diagnosis not present

## 2016-12-28 DIAGNOSIS — E118 Type 2 diabetes mellitus with unspecified complications: Secondary | ICD-10-CM | POA: Diagnosis not present

## 2016-12-28 DIAGNOSIS — E1142 Type 2 diabetes mellitus with diabetic polyneuropathy: Secondary | ICD-10-CM

## 2016-12-28 DIAGNOSIS — R600 Localized edema: Secondary | ICD-10-CM

## 2016-12-28 DIAGNOSIS — G20 Parkinson's disease: Secondary | ICD-10-CM

## 2016-12-28 DIAGNOSIS — Z1211 Encounter for screening for malignant neoplasm of colon: Secondary | ICD-10-CM | POA: Diagnosis not present

## 2016-12-28 DIAGNOSIS — I10 Essential (primary) hypertension: Secondary | ICD-10-CM | POA: Diagnosis not present

## 2016-12-28 LAB — POCT GLYCOSYLATED HEMOGLOBIN (HGB A1C): Hemoglobin A1C: 5.8

## 2016-12-28 MED ORDER — CARBIDOPA-LEVODOPA 25-100 MG PO TABS: 2.0000 | ORAL_TABLET | Freq: Three times a day (TID) | ORAL | 3 refills | Status: DC

## 2016-12-28 MED ORDER — FLUTICASONE PROPIONATE 50 MCG/ACT NA SUSP: 2.0000 | Freq: Every day | NASAL | 5 refills | Status: DC

## 2016-12-28 MED ORDER — TORSEMIDE 20 MG PO TABS: 20.0000 mg | ORAL_TABLET | Freq: Every day | ORAL | 0 refills | Status: DC

## 2016-12-28 NOTE — Progress Notes (Signed)
Subjective:    Nykolas is a 71 y.o. old male here   HPI Left leg swelling: this is a chronic issue.  He has severe vascular disease.  He is followed by vascular surgery.  He has been managing the swelling with diuretics.  He reports taking his torsemide.  However, his torsemide was last filled about 3 months ago with 30-day supply without refill.   Neuropathy: Chronic issue.  He is on Lyrica.  He says Lyrica is not helping much but making him sleepy.  He tried gabapentin in the past.  He asked [place] if we can increase his Lyrica dose.   Sore throat:  for two to three weeks.  Had some congestion, postnasal dripping and cough with whitish to yellowish phlegm.  Denies shortness of breath, chest pain or fever.  He reports using his Flonase nasal spray intermittently.   Diabetes: A1c 5.8% today.  Not on any medication.  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; CKD Stage 2; Depression; Bilateral foot pain; Back pain with sciatica; Postlaminectomy syndrome, cervical region; Left arm swelling; Gait abnormality; Rhinitis, allergic; Parkinson disease ([place]); Chronic back pain; Type 2 diabetes mellitus with complication ([place]); Diminished hearing, left; BPH with Refracotry Urinary Retention; Diabetic polyneuropathy associated with type 2 diabetes mellitus ([place]); Routine adult health maintenance; Leg swelling; PAD (peripheral artery disease) ([place]); Enrolled in chronic care management; Hyperlipidemia; and Memory change on his problem list.   has a past medical history of Bilateral foot pain; Chronic pain; Diabetes mellitus; Foley catheter in place (since nov 2018, last changed 1 week ago); Hyperlipidemia; Hypertension; Inguinal hernia; Numbness; Parkinson's disease ([place]); Pneumonia (yrs ago); Shortness of breath; and  Urinary retention.  FH:  Family History  Problem Relation Age of Onset  . Cancer - Other Other   . Diabetes Other   . Hypertension Other     [place] Social History  Substance Use Topics  . Smoking status: Never Smoker  . Smokeless tobacco: Never Used  . Alcohol use 0.0 oz/week     Comment: 1 shot a day    Review of Systems Review of systems negative except for pertinent positives and negatives in history of present illness above.     Objective:     Vitals:   12/28/16 1109  BP: 136/82  Pulse: 82  Temp: 97.6 F (36.4 C)  TempSrc: Oral  SpO2: 96%  Weight: 204 lb 3.2 oz (92.6 kg)  Height: 5\' 9"  (1.753 m)   Body mass index is 30.16 kg/m.  Physical Exam GEN: appears well, no apparent distress. Oropharynx: mmm without erythema or exudation.  No cobblestoning. HEM: negative for cervical or periauricular lymphadenopathies CVS: RRR, nl S1&S2, 1+ edema bilaterally, left leg > right.  RESP: no IWOB, good air movement bilaterally, CTAB MSK: no focal tenderness or notable swelling SKIN: Scattered firm plaques on his left leg anteriorly.  See picture below.   NEURO: alert and oiented appropriately, no gross deficits  PSYCH: euthymic mood with congruent affect    Assessment and Plan:  1. Left leg swelling: Chronic issue likely due to underlying PAD.  You must have been out of his diuretics for a long time also he reports taking this until this morning.  After reviewing his chart, the last time we refilled his torsemide was about 3 months ago.  He must have been out of his torsemide 2 months  ago. Patient is followed by vascular surgery.  He had extensive workup for his PAD.  -Refilled his torsemide today. -BMP -Recommended leg elevation -Return to clinic if no improvement-  2. Post-nasal drip: Refilled his Flonase.  Went over proper way of using it - fluticasone (FLONASE) 50 MCG/ACT nasal spray; Place 2 sprays into both nostrils daily.  Dispense: 16 g; Refill: 5  3. Diabetic  polyneuropathy associated with type 2 diabetes mellitus ([place]) -We will continue Lyrica at current dose.   5. Colon cancer screening - Ambulatory referral to Gastroenterology  6. Type 2 diabetes mellitus with complication, unspecified whether long term insulin use (HCC) stable. A1c 5.8 today. Not on any medication.    7. Need for immunization against influenza - Flu Vaccine QUAD 36+ mos IM   Return if symptoms worsen or fail to improve.  Mercy Riding, MD 12/28/16 Pager: [phone]

## 2016-12-28 NOTE — Patient Instructions (Signed)
It was great seeing you today! We have addressed the following issues today  1. Leg swelling: We refilled your fluid pill today.  I suggest taking 2 tablets for the next 3 days.  Once the swelling improves, you can go back to 1 tablet a day. 2. Neuropathic pain: Continue taking the Lyrica. 3. Sore throat: This is likely due to allergy.  Use your Flonase daily as we discussed in the office.  You can also try a tablespoonfull of honey before bedtime for cough. 4. Colon cancer screening: Will order a referral to gastroenterologist for your colonoscopy.  Someone might call you for this in the next 1-2 weeks.   If we did any lab work today, and the results require attention, either me or my nurse will get in touch with you. If everything is normal, you will get a letter in mail and a message via . If you don't hear from [place] in two weeks, please give [place] a call. Otherwise, we look forward to seeing you again at your next visit. If you have any questions or concerns before then, please call the clinic at [phone].  Please bring all your medications to every doctors visit  Sign up for My Chart to have easy access to your labs results, and communication with your Primary care physician.    Please check-out at the front desk before leaving the clinic.    Take Care,   Dr. Cyndia Skeeters

## 2016-12-29 LAB — BASIC METABOLIC PANEL
BUN/Creatinine Ratio: 12 (ref 10–24)
BUN: 17 mg/dL (ref 8–27)
CO2: 26 mmol/L (ref 20–29)
Calcium: 9.6 mg/dL (ref 8.6–10.2)
Chloride: 102 mmol/L (ref 96–106)
Creatinine, Ser: 1.42 mg/dL — ABNORMAL HIGH (ref 0.76–1.27)
GFR calc Af Amer: 58 mL/min/{1.73_m2} — ABNORMAL LOW (ref >59)
GFR calc non Af Amer: 50 mL/min/{1.73_m2} — ABNORMAL LOW (ref >59)
Glucose: 100 mg/dL — ABNORMAL HIGH (ref 65–99)
Potassium: 3.9 mmol/L (ref 3.5–5.2)
Sodium: 142 mmol/L (ref 134–144)

## 2016-12-31 ENCOUNTER — Other Ambulatory Visit: Payer: Self-pay | Admitting: *Deleted

## 2016-12-31 DIAGNOSIS — G20 Parkinson's disease: Secondary | ICD-10-CM

## 2016-12-31 DIAGNOSIS — R600 Localized edema: Secondary | ICD-10-CM

## 2016-12-31 MED ORDER — TORSEMIDE 20 MG PO TABS: 20.0000 mg | ORAL_TABLET | Freq: Every day | ORAL | 0 refills | Status: DC

## 2016-12-31 MED ORDER — CARBIDOPA-LEVODOPA 25-100 MG PO TABS: 2.0000 | ORAL_TABLET | Freq: Three times a day (TID) | ORAL | 3 refills | Status: DC

## 2017-01-10 ENCOUNTER — Ambulatory Visit: Payer: Medicare Other

## 2017-01-25 ENCOUNTER — Other Ambulatory Visit: Payer: Self-pay | Admitting: Student

## 2017-01-25 DIAGNOSIS — R600 Localized edema: Secondary | ICD-10-CM

## 2017-01-28 ENCOUNTER — Ambulatory Visit (INDEPENDENT_AMBULATORY_CARE_PROVIDER_SITE_OTHER): Payer: Medicare Other | Admitting: Student

## 2017-01-28 ENCOUNTER — Other Ambulatory Visit: Payer: Self-pay

## 2017-01-28 ENCOUNTER — Encounter: Payer: Self-pay | Admitting: Student

## 2017-01-28 VITALS — BP 138/80 | HR 84 | Temp 98.3°F | Ht 69.0 in | Wt 207.6 lb

## 2017-01-28 DIAGNOSIS — K219 Gastro-esophageal reflux disease without esophagitis: Secondary | ICD-10-CM | POA: Diagnosis not present

## 2017-01-28 DIAGNOSIS — G8929 Other chronic pain: Secondary | ICD-10-CM

## 2017-01-28 DIAGNOSIS — M5442 Lumbago with sciatica, left side: Secondary | ICD-10-CM | POA: Diagnosis not present

## 2017-01-28 DIAGNOSIS — L602 Onychogryphosis: Secondary | ICD-10-CM

## 2017-01-28 DIAGNOSIS — L989 Disorder of the skin and subcutaneous tissue, unspecified: Secondary | ICD-10-CM | POA: Diagnosis not present

## 2017-01-28 DIAGNOSIS — M5441 Lumbago with sciatica, right side: Secondary | ICD-10-CM

## 2017-01-28 MED ORDER — OMEPRAZOLE 20 MG PO CPDR: 20.0000 mg | DELAYED_RELEASE_CAPSULE | Freq: Every day | ORAL | 3 refills | Status: DC

## 2017-01-28 MED ORDER — MULTIVITAMIN MEN 50+ PO TABS: 1.0000 | ORAL_TABLET | Freq: Every day | ORAL | 11 refills | Status: DC

## 2017-01-28 NOTE — Patient Instructions (Signed)
It was great seeing you today! We have addressed the following issues today  Toenails: We have trimmed your toenails today.  Please make sure you follow-up with your foot doctor as well.   Back and leg pain: I will get in touch with you over the phone once I discussed about your pain medications and you urine results from 3 months ago.   If we did any lab work today, and the results require attention, either me or my nurse will get in touch with you. If everything is normal, you will get a letter in mail and a message via . If you don't hear from [place] in two weeks, please give [place] a call. Otherwise, we look forward to seeing you again at your next visit. If you have any questions or concerns before then, please call the clinic at [phone].  Please bring all your medications to every doctors visit  Sign up for My Chart to have easy access to your labs results, and communication with your Primary care physician.    Please check-out at the front desk before leaving the clinic.    Take Care,   Dr. Cyndia Skeeters

## 2017-01-28 NOTE — Progress Notes (Signed)
Subjective:    Tyler Lester is a 71 y.o. old male here for toenails and pain medication  HPI Chronic back pain: Patient with cervical spine degeneration with stenosis and myelopathy status post posterior spinal decompression and fusion of C3-4 through C6-7 performing on 11/12/2013 by Dr. Phylliss Bob.  Had MRI lumbar spine on with 03/06/2010 with lateral recess and foraminal stenosis at L3-4 and L4-5 and L5-S1.  Also with mild PAD and neuropathy bilaterally. He returns to clinic today for refill on his pain medication.  He gets Norco 7.5/325 mg, number 90 every month.  He was given 3 prescriptions to last him 3 months this about 3 months ago.  At that time, UDS was negative for opiates although the patient reported taking his medication that morning.  When I discussed this with the patient, patient states he might have forgotten to take his medication that morning.  His pain is basically unchanged from baseline.  He denies red flags such as fever, urinary retention, bowel or bladder issue, saddle anesthesia, night sweats or unintentional weight loss.  Toenails: Patient likes to have his toenails trimmed today.  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; CKD Stage 2; Depression; Bilateral foot pain; Back pain with sciatica; Postlaminectomy syndrome, cervical region; Left arm swelling; Gait abnormality; Rhinitis, allergic; Parkinson disease ([place]); Chronic back pain; Type 2 diabetes mellitus with complication ([place]); Diminished hearing, left; BPH with Refracotry Urinary Retention; Diabetic polyneuropathy associated with type 2 diabetes mellitus ([place]); Routine adult health maintenance; Leg swelling; PAD (peripheral artery disease) ([place]); Enrolled in chronic care management; Hyperlipidemia; Memory  change; and Chronic bilateral low back pain with bilateral sciatica on their problem list.   has a past medical history of Bilateral foot pain, Chronic pain, Diabetes mellitus, Foley catheter in place (since nov 2018, last changed 1 week ago), Hyperlipidemia, Hypertension, Inguinal hernia, Numbness, Parkinson's disease ([place]), Pneumonia (yrs ago), Shortness of breath, and Urinary retention.  FH:  Family History  Problem Relation Age of Onset  . Cancer - Other Other   . Diabetes Other   . Hypertension Other     [place] Social History   Tobacco Use  . Smoking status: Never Smoker  . Smokeless tobacco: Never Used  Substance Use Topics  . Alcohol use: Yes    Alcohol/week: 0.0 oz    Comment: 1 shot a day  . Drug use: No    Review of Systems Review of systems negative except for pertinent positives and negatives in history of present illness above.     Objective:     Vitals:   01/28/17 1100  BP: 138/80  Pulse: 84  Temp: 98.3 F (36.8 C)  TempSrc: Oral  SpO2: 97%  Weight: 207 lb 9.6 oz (94.2 kg)  Height: 5\' 9"  (1.753 m)   Body mass index is 30.66 kg/m.  Physical Exam  GEN: appears well, no apparent distress. CVS: poor circulation in both legs (chronic) RESP: no IWOB MSK: no focal tenderness. Edema in his legs bilaterally. Motor 5/5 in both lower extremities. Light sensation diminished in both legs (chronic neuropathy). Resting tremor, mild cog wheel rigidity and shuffling gait. SKIN: shiny skin both legs bilaterally. Raised skin lesions on his legs bilaterally.  Hypertrophic toenails bilaterally.      NEURO: alert and oiented appropriately. The rest as above PSYCH: euthymic mood with congruent affect.  Assessment and Plan:  1. Chronic bilateral low back pain with bilateral sciatica: Stable. No red flags for infectious process, caudae equina or malignancy.  Discussed about his negative UDS from 3 months ago.  Today, he says he might have forgotten to take his medication  that morning although he reported taking at that time.  I am concerned about possible diversion. I am also questioning how sincere he is with [place] after my communication with his neurologist about his parkinson diseases a couple of months ago. I discussed this with Dr. Wendy Poet but would like to get more input due to the complexity of this situation. I told him I will get in touch with him after talking to my seniors. He voiced understanding and agrees  2. Overgrown toenails: with dystrophy. Trimmed x10. Encouraged him to follow up with his podiatrist  3. Skin lesion of lower extremity: likely due to his PAD. This is stable from the last time. We may need skin biopsy for more clarification  Return in about 1 week (around 02/04/2017) for Annual wellness visits.  Mercy Riding, MD 01/30/17 Pager: [phone]

## 2017-01-29 ENCOUNTER — Telehealth: Payer: Self-pay | Admitting: Student

## 2017-01-29 NOTE — Telephone Encounter (Signed)
Pt called and would like refill on hydrocodone. Please advise

## 2017-01-30 DIAGNOSIS — G8929 Other chronic pain: Secondary | ICD-10-CM | POA: Insufficient documentation

## 2017-01-30 DIAGNOSIS — M5442 Lumbago with sciatica, left side: Principal | ICD-10-CM

## 2017-01-30 DIAGNOSIS — M5441 Lumbago with sciatica, right side: Principal | ICD-10-CM

## 2017-01-31 ENCOUNTER — Encounter: Payer: Self-pay | Admitting: Student

## 2017-01-31 NOTE — Telephone Encounter (Signed)
Pt called again about the refill on his pain med.  He is completely out

## 2017-01-31 NOTE — Telephone Encounter (Signed)
I talked to him. He is not due yet. He can wait until 02/04/17. He also failed drug test. I told him I am discussing this with my seniors as he violated the pain contract.

## 2017-02-01 ENCOUNTER — Other Ambulatory Visit: Payer: Self-pay | Admitting: Student

## 2017-02-01 DIAGNOSIS — M549 Dorsalgia, unspecified: Secondary | ICD-10-CM

## 2017-02-01 DIAGNOSIS — M5430 Sciatica, unspecified side: Secondary | ICD-10-CM

## 2017-02-01 DIAGNOSIS — G8929 Other chronic pain: Secondary | ICD-10-CM

## 2017-02-01 DIAGNOSIS — M5441 Lumbago with sciatica, right side: Principal | ICD-10-CM

## 2017-02-01 DIAGNOSIS — M5442 Lumbago with sciatica, left side: Principal | ICD-10-CM

## 2017-02-01 MED ORDER — HYDROCODONE-ACETAMINOPHEN 7.5-325 MG PO TABS: 1.0000 | ORAL_TABLET | Freq: Three times a day (TID) | ORAL | 0 refills | Status: DC | PRN

## 2017-02-01 NOTE — Progress Notes (Unsigned)
Called and talked to patient after ****

## 2017-02-05 NOTE — Telephone Encounter (Signed)
Discussed patient's case with Dr. Ardelia Mems. Will give him one month supply. Will recheck his UDS after observed drug administration. Will renew pain contract at that visit. If the UDS is positive, we will discuss with the patient that there could be a potential to be dismissed if his UDS is negative in the future.

## 2017-02-11 ENCOUNTER — Other Ambulatory Visit: Payer: Self-pay | Admitting: Student

## 2017-02-11 DIAGNOSIS — M79602 Pain in left arm: Secondary | ICD-10-CM

## 2017-02-11 DIAGNOSIS — M7989 Other specified soft tissue disorders: Secondary | ICD-10-CM

## 2017-02-12 ENCOUNTER — Other Ambulatory Visit: Payer: Self-pay | Admitting: Student

## 2017-02-12 DIAGNOSIS — K219 Gastro-esophageal reflux disease without esophagitis: Secondary | ICD-10-CM

## 2017-02-12 MED ORDER — OMEPRAZOLE 20 MG PO CPDR: 20.0000 mg | DELAYED_RELEASE_CAPSULE | Freq: Every day | ORAL | 3 refills | Status: DC

## 2017-02-12 NOTE — Telephone Encounter (Signed)
Sent his refill to mail order pharmacy. Please advise him. Thank you!

## 2017-02-12 NOTE — Telephone Encounter (Signed)
Pt said he never got his acid reflux medication from his appointment on 12/3. It was sent to the mail service pharmacy, but he wants it sent to [facility] so he can get it since he never received it Please advise

## 2017-02-13 NOTE — Telephone Encounter (Signed)
Pt informed. Zimmerman Rumple, Arali Somera D, CMA  

## 2017-02-20 ENCOUNTER — Telehealth: Payer: Self-pay | Admitting: Student

## 2017-02-20 DIAGNOSIS — K219 Gastro-esophageal reflux disease without esophagitis: Secondary | ICD-10-CM

## 2017-02-20 NOTE — Telephone Encounter (Signed)
Pt is calling because he requested a refill on his Prilosec. The doctor sent this is on 02/12/17 to his mail order pharmacy.  Since this is the holiday season they are running behind because of this and he is now out of his Prilosec. Can we call one month of this in to his pharmacy in town [facility] on [address]. jw

## 2017-02-21 MED ORDER — OMEPRAZOLE 20 MG PO CPDR: 20.0000 mg | DELAYED_RELEASE_CAPSULE | Freq: Every day | ORAL | 0 refills | Status: DC

## 2017-02-21 NOTE — Telephone Encounter (Signed)
Rx for omeprazole sent to [facility] per patient's request.

## 2017-02-28 ENCOUNTER — Other Ambulatory Visit: Payer: Self-pay | Admitting: Student

## 2017-03-04 ENCOUNTER — Telehealth: Payer: Self-pay | Admitting: *Deleted

## 2017-03-04 DIAGNOSIS — M79642 Pain in left hand: Secondary | ICD-10-CM | POA: Diagnosis not present

## 2017-03-04 DIAGNOSIS — G5602 Carpal tunnel syndrome, left upper limb: Secondary | ICD-10-CM | POA: Diagnosis not present

## 2017-03-04 DIAGNOSIS — G5622 Lesion of ulnar nerve, left upper limb: Secondary | ICD-10-CM | POA: Diagnosis not present

## 2017-03-04 NOTE — Telephone Encounter (Signed)
Received message on nurse line from [facility] with [facility] reporting patient no showed appt with Dr. Watt Climes today. Hubbard Hartshorn, RN, BSN

## 2017-03-07 ENCOUNTER — Other Ambulatory Visit: Payer: Self-pay

## 2017-03-07 ENCOUNTER — Ambulatory Visit (INDEPENDENT_AMBULATORY_CARE_PROVIDER_SITE_OTHER): Payer: Medicare Other | Admitting: Student

## 2017-03-07 ENCOUNTER — Encounter: Payer: Self-pay | Admitting: Student

## 2017-03-07 VITALS — BP 124/80 | HR 74 | Temp 97.8°F | Ht 69.0 in | Wt 205.8 lb

## 2017-03-07 DIAGNOSIS — H9191 Unspecified hearing loss, right ear: Secondary | ICD-10-CM

## 2017-03-07 DIAGNOSIS — M5441 Lumbago with sciatica, right side: Secondary | ICD-10-CM | POA: Diagnosis not present

## 2017-03-07 DIAGNOSIS — M5442 Lumbago with sciatica, left side: Secondary | ICD-10-CM | POA: Diagnosis not present

## 2017-03-07 DIAGNOSIS — G8929 Other chronic pain: Secondary | ICD-10-CM | POA: Diagnosis not present

## 2017-03-07 DIAGNOSIS — M549 Dorsalgia, unspecified: Secondary | ICD-10-CM

## 2017-03-07 DIAGNOSIS — G20 Parkinson's disease: Secondary | ICD-10-CM

## 2017-03-07 DIAGNOSIS — M5430 Sciatica, unspecified side: Secondary | ICD-10-CM | POA: Diagnosis not present

## 2017-03-07 MED ORDER — HYDROCODONE-ACETAMINOPHEN 7.5-325 MG PO TABS: 1.0000 | ORAL_TABLET | Freq: Three times a day (TID) | ORAL | 0 refills | Status: DC | PRN

## 2017-03-07 NOTE — Progress Notes (Signed)
Subjective:    Tyler Lester is a 72 y.o. old male here trouble hearing  HPI Trouble hearing: for two weeks. Has to turn TV louder to hear. Denies ear pain, ringing in ear, fever, sore throat, ear discharge, recent illness, URI symptoms or headache. This is new for him. Denies new medicine, using Q-tips or swimming.. Denies recent fall or trauma.  He had a hearing screen about 5 years ago.  At that time, he failed his hearing screen in the right ear  Back and leg pain: Chronic issue.  Patient with chronic back pain on chronic opiates.  Also on Lyrica.  Also have peripheral neuropathy and PAD.  He reports taking his Norco last night.  He is quite sure about this.  He agrees with urine drug screening today.  I warned him that if his urine drug screen is negative today, we may not be able to continue filling his Norco.  He is confident about taking his medication.  Voiced understanding base.  He agrees to the urine drug screening today.  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; CKD Stage 2; Depression; Bilateral foot pain; Back pain with sciatica; Postlaminectomy syndrome, cervical region; Left arm swelling; Gait abnormality; Rhinitis, allergic; Parkinson disease ([place]); Chronic back pain; Type 2 diabetes mellitus with complication ([place]); Diminished hearing, left; BPH with Refracotry Urinary Retention; Diabetic polyneuropathy associated with type 2 diabetes mellitus ([place]); Routine adult health maintenance; Leg swelling; PAD (peripheral artery disease) ([place]); Enrolled in chronic care management; Hyperlipidemia; Memory change; and Chronic bilateral low back pain with bilateral sciatica on their problem list.   has a past medical history of Bilateral foot pain, Chronic pain, Diabetes mellitus,  Foley catheter in place (since nov 2018, last changed 1 week ago), Hyperlipidemia, Hypertension, Inguinal hernia, Numbness, Parkinson's disease ([place]), Pneumonia (yrs ago), Shortness of breath, and Urinary retention.  FH:  Family History  Problem Relation Age of Onset  . Cancer - Other Other   . Diabetes Other   . Hypertension Other     SH Social History   Tobacco Use  . Smoking status: Never Smoker  . Smokeless tobacco: Never Used  Substance Use Topics  . Alcohol use: Yes    Alcohol/week: 0.0 oz    Comment: 1 shot a day  . Drug use: No    Review of Systems Review of systems negative except for pertinent positives and negatives in history of present illness above.     Objective:     Vitals:   03/07/17 0951  BP: 124/80  Pulse: 74  Temp: 97.8 F (36.6 C)  TempSrc: Oral  SpO2: 95%  Weight: 205 lb 12.8 oz (93.4 kg)  Height: 5\' 9"  (1.753 m)   Body mass index is 30.39 kg/m.  Physical Exam  GEN: appears well, no apparent distress. Head: normocephalic and atraumatic  Eyes: conjunctiva without injection, sclera anicteric Ears: No periauricular skin lesion or swelling, no pitting, no discharge, no periauricular tenderness to palpation, no tenderness with pressure on tragus or gentle tug on pinnae  Ear canal: some wax but not blocking. Otherwise, normal.   TM: Without erythema or effusion. Overall, looks normal.  Hearing: hears finger rubs in the left, Weber lateralizes to right, Rene AC>BC bilaterally.   Oropharynx: mmm without erythema or exudation HEM: negative for cervical or periauricular lymphadenopathies CVS: RRR, nl s1 & s2, no  murmurs, trace nonpitting edema RESP: no IWOB, good air movement bilaterally, CTAB GI: BS present & normal, soft, NTND MSK: no focal tenderness SKIN: no apparent skin lesion NEURO: alert and oiented appropriately, no gross deficits, some resting tremor in his arms  PSYCH: euthymic mood with congruent affect  Assessment and Plan:    1. Decreased hearing of right ear: not sure if this is chronic or acute.  He failed hearing screening in his right ear about 5 years ago.  Today, he could not hear finger rub in the right ear.  Weber test lateralized to the right.  Renee test with air conduction greater than bone conduction bilaterally.  We will send him to audiology for formal hearing evaluation.  Recommended using Debrox for wax.  His wax is not blocking his ear canal.   2. Chronic bilateral low back pain with bilateral sciatica: Chronic issue.  Has been on Norco.  Had negative UDS in the past.  He is quite sure that he took his Norco last night.  Agrees to UDS today.  He understands that if his UDS is negative again, he won't get Norco from [place].  - ToxASSURE Select 13 (MW), Urine - HYDROcodone-acetaminophen (NORCO) 7.5-325 MG tablet; Take 1 tablet by mouth every 8 (eight) hours as needed for moderate pain.  Dispense: 90 tablet; Refill: 0  3. Parkinson disease [facility]): Chronic issue.  No acute change. However, he says his walking is slower. He asks if we can adjust his Parkinson medicine.  I referred him to neurology recently.  Unfortunately, neurology did not want to see him because he was not truthful with them.  However, they recommended keeping his medication the way it is.  I also referred him to [facility] in the past.  Unfortunately, he declined their services.  I also discussed about  PACE which would have been great for him but he declines.  He is agreeable to see our geriatrician in our geriatric clinic.   Return in about 1 month (around 04/07/2017) for Geriatric clinic.  Mercy Riding, MD 03/07/17 Pager: [phone]

## 2017-03-07 NOTE — Patient Instructions (Addendum)
It was great seeing you today! 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  (see below for more on this).  For earwax, get an earwax drop called Debrox.  You can apply up to 5 drops every 6 hours.  Back and leg pain: We refilled your prescription for Norco today. You are sure that you took your medication last night.  We are checking your urine.  If you urine test is negative, you may not be able to get pain medication from [place].  Please follow-up in our geriatric clinic   If we did any lab work today, and the results require attention, either me or my nurse will get in touch with you. If everything is normal, you will get a letter in mail and a message via . If you don't hear from [place] in two weeks, please give [place] a call. Otherwise, we look forward to seeing you again at your next visit. If you have any questions or concerns before then, please call the clinic at [phone].  Please bring all your medications to every doctors visit  Sign up for My Chart to have easy access to your labs results, and communication with your Primary care physician.    Please check-out at the front desk before leaving the clinic.    Take Care,   Dr. Cyndia Skeeters   Presbycusis Age-related hearing loss (presbycusis) affects nearly one third of the elderly. It generally starts around middle age and is more common in men. The changes causing this take place in the cochlea, a cavity in the middle ear. The cochlea contains many tiny hairs that convert sound vibrations into electrical impulses, which are interpreted by your brain. As we grow older, this type of hearing loss is called sensorineural hearing loss. It is permanent and cannot be corrected surgically. People with this type of hearing loss will probably need hearing aids. What are the causes? Presbycusis is caused by sensorineural hearing loss. There are three different  types of hearing loss:  Sensorineural hearing loss-This type of hearing loss happens when nerves in the ear that register sounds and send signals to the brain are damaged.  Conductive hearing loss-This type of hearing loss happens when sounds cannot get to the inner ear because of problems in the middle or outer ear. Conductive hearing loss can happen if you have too much wax in your ear, and sounds are being muffled. It can also happen if you have an ear infection and parts of your ear are swollen or filled with fluid.  Mixed hearing loss-Mixed hearing loss happens when both types of hearing loss occur at the same time.  What increases the risk?  Age.  Male gender.  Low socioeconomic status.  Noise exposure.  Substances that are toxic to organs in your ear that have to do with hearing and balance (ototoxic agents), such as aminoglycosides, chemotherapeutic agents, or heavy metals).  Certain infections.  Certain medical conditions or diseases (such as hypertension, diabetes, or immunologic disorders).  Hormonal factors. What are the signs or symptoms?  Slow, progressive hearing loss in older people. The amount of hearing loss is the same in both ears (symmetric). This usually affects how well you hear high-pitched sounds.  Ringing in ears (tinnitus).  Dizziness. How is this diagnosed? The diagnosis of presbycusis is based on your medical history. The physical examination (usually with an otoscope) may be helpful in determining the  type of hearing loss as well as possible contributing factors for hearing loss, such as ear wax or other causes. People with hearing loss should have formal audiogram testing to confirm the diagnosis, determine severity, and to direct management. How is this treated? The hearing loss cannot be corrected surgically. The following things may help you hear better:  Hearing aids.  Cochlear implantation.  Assistive listening devices. ? These may be  linked with hearing aids for telephone use or frequency-modulation systems that transmit sound information directly to your hearing aid. ? Assistive listening devices may also be independent of hearing aids. Touch or visual alerts can compensate for lack of auditory input, such as flashing lights for a doorbell.  Auditory rehabilitation.  Follow these instructions at home:  Face people who are speaking with you so you can see their lips move while they talk.  Watch cues such as hand gestures and facial expressions of people while they are talking with you.  Decrease, or if possible, avoid background noise. Contact a health care provider if:  You have increased trouble hearing.  Someone close to you notices you are having trouble hearing or understanding them. Presbycusis happens slowly over time. You might not notice the changes yourself. Get help right away if:  You lose hearing suddenly (over a few hours or a day).  You have numbness.  You have ear or head pain associated with hearing loss.  You have tremors.  You have blackouts.  You have a seizure.  You have areas of weakness in your body. This information is not intended to replace advice given to you by your health care provider. Make sure you discuss any questions you have with your health care provider. Document Released: 02/10/2000 Document Revised: 07/21/2015 Document Reviewed: 09/09/2012 Elsevier Interactive Patient Education  2017 [employer].

## 2017-03-13 LAB — TOXASSURE SELECT 13 (MW), URINE

## 2017-03-14 ENCOUNTER — Telehealth: Payer: Self-pay | Admitting: Student

## 2017-03-14 NOTE — Telephone Encounter (Signed)
Felecia would like to dr Cyndia Skeeters about pts memory.  She has noticed a few things he is doing that strange.  Please advise

## 2017-03-15 NOTE — Telephone Encounter (Signed)
Called and talked to [facility] who has a concern about patient.  She says patient has been slower than usual when he walks. She also noted some hesitancy when he walks intermittently backing off. These have been going on for the last three to four days. She denies fall. Denies recent Illness or new medication.  Patient has history of Parkinson disease.  He used to see neurologist at [facility] but hasn't been seen there in awhile. I recommended she [name]) call his neurologist office ([place]). I gave her their phone number. They may need to adjust some of his parkinson medications. We may also back off on his pain medications as well. He has an appointment in Geriatric clinic in two weeks. We will review his meds at that time.  Meanwhile, I recommended bringing him to clinic or taking him to ED if he has acute worsening of mental status or behavioral change.  She voiced understanding and agrees.

## 2017-03-20 ENCOUNTER — Telehealth: Payer: Self-pay | Admitting: Student

## 2017-03-20 NOTE — Telephone Encounter (Signed)
Pt wanted to let [place] know he will be going into surgery for his left arm in the first week of February.

## 2017-03-28 ENCOUNTER — Ambulatory Visit: Payer: Medicare Other

## 2017-04-03 ENCOUNTER — Telehealth: Payer: Self-pay | Admitting: Student

## 2017-04-03 NOTE — Telephone Encounter (Signed)
Pt is going into sugery this Friday and would like a nurse aide to come to his home for PT for maybe 2-3 days out of the week following his surgery. Please advise

## 2017-04-04 ENCOUNTER — Encounter (HOSPITAL_COMMUNITY): Payer: Medicare Other

## 2017-04-04 ENCOUNTER — Ambulatory Visit: Payer: Medicare Other | Admitting: Family

## 2017-04-05 ENCOUNTER — Other Ambulatory Visit: Payer: Self-pay | Admitting: Student

## 2017-04-05 ENCOUNTER — Other Ambulatory Visit: Payer: Self-pay

## 2017-04-05 ENCOUNTER — Telehealth: Payer: Self-pay | Admitting: Student

## 2017-04-05 DIAGNOSIS — M5430 Sciatica, unspecified side: Secondary | ICD-10-CM

## 2017-04-05 DIAGNOSIS — M549 Dorsalgia, unspecified: Principal | ICD-10-CM

## 2017-04-05 DIAGNOSIS — G20 Parkinson's disease: Secondary | ICD-10-CM

## 2017-04-05 DIAGNOSIS — R413 Other amnesia: Secondary | ICD-10-CM

## 2017-04-05 DIAGNOSIS — G5602 Carpal tunnel syndrome, left upper limb: Secondary | ICD-10-CM | POA: Diagnosis not present

## 2017-04-05 DIAGNOSIS — G5622 Lesion of ulnar nerve, left upper limb: Secondary | ICD-10-CM | POA: Diagnosis not present

## 2017-04-05 DIAGNOSIS — I739 Peripheral vascular disease, unspecified: Secondary | ICD-10-CM

## 2017-04-05 DIAGNOSIS — R269 Unspecified abnormalities of gait and mobility: Secondary | ICD-10-CM

## 2017-04-05 NOTE — Telephone Encounter (Signed)
Home health RN and PT ordered

## 2017-04-05 NOTE — Telephone Encounter (Signed)
Pt called because he has surgery at the [facility] today. The doctor gave him 10 percocet to help with the surgery pain. He called to let us know before filling them because he also is dur for his hydrocodone on the 10th. He didn't want to fill them if this was going to be against him since he already get hydrocodone. I talked with Dr. McDiarmid who said that since he was calling asking first and also because he had surgery to day he could fill the percocet and that it would not effect his Hydrocodone he gets from [place]. jw

## 2017-04-05 NOTE — Telephone Encounter (Signed)
Pharmacy was giving pt a "hard time" about filling both.  I contacted pharmacy and advised that our provider gave the ok but pt is to take either/or not both.  Pharmacy will relay message. Shanitha Twining, Salome Spotted, CMA

## 2017-04-05 NOTE — Progress Notes (Signed)
Home health RN and PT ordered.

## 2017-04-07 MED ORDER — HYDROCODONE-ACETAMINOPHEN 7.5-325 MG PO TABS: 1.0000 | ORAL_TABLET | Freq: Three times a day (TID) | ORAL | 0 refills | Status: DC | PRN

## 2017-04-15 DIAGNOSIS — G20 Parkinson's disease: Secondary | ICD-10-CM | POA: Diagnosis not present

## 2017-04-19 ENCOUNTER — Telehealth: Payer: Self-pay | Admitting: Student

## 2017-04-19 NOTE — Telephone Encounter (Signed)
I ordered home PT and RN on 04/05/17. It may take sometimes to hear from them. He declined [facility] in the past.  Thanks,  Bretta Bang

## 2017-04-19 NOTE — Telephone Encounter (Signed)
Will forward to MD. Jazmin Hartsell,CMA  

## 2017-04-19 NOTE — Telephone Encounter (Signed)
Pt would like to begin physical therapy at home.  Please advise

## 2017-04-22 ENCOUNTER — Telehealth: Payer: Self-pay | Admitting: Student

## 2017-04-22 NOTE — Telephone Encounter (Signed)
Advice him to schedule office visit please! Thanks, Bretta Bang

## 2017-04-22 NOTE — Telephone Encounter (Signed)
Pt would like muscle relaxers. Please advise

## 2017-04-23 NOTE — Telephone Encounter (Signed)
LVM for pt to call back to inform him of below and to assist him in getting this appointment scheduled. Katharina Caper, Stetson Pelaez D, [name]

## 2017-04-23 NOTE — Telephone Encounter (Signed)
Pt has appointment scheduled for 04/29/17 for this. Katharina Caper, Jasyn Mey D, [name]

## 2017-04-29 ENCOUNTER — Ambulatory Visit (INDEPENDENT_AMBULATORY_CARE_PROVIDER_SITE_OTHER): Payer: Medicare Other | Admitting: Student

## 2017-04-29 ENCOUNTER — Other Ambulatory Visit: Payer: Self-pay

## 2017-04-29 ENCOUNTER — Encounter: Payer: Self-pay | Admitting: Student

## 2017-04-29 VITALS — BP 130/90 | HR 77 | Temp 97.8°F | Ht 69.0 in | Wt 203.0 lb

## 2017-04-29 DIAGNOSIS — I10 Essential (primary) hypertension: Secondary | ICD-10-CM

## 2017-04-29 DIAGNOSIS — M79604 Pain in right leg: Secondary | ICD-10-CM | POA: Diagnosis not present

## 2017-04-29 DIAGNOSIS — M7989 Other specified soft tissue disorders: Secondary | ICD-10-CM

## 2017-04-29 DIAGNOSIS — M79605 Pain in left leg: Secondary | ICD-10-CM | POA: Diagnosis not present

## 2017-04-29 MED ORDER — RIVAROXABAN 15 MG PO TABS: 15.0000 mg | ORAL_TABLET | Freq: Two times a day (BID) | ORAL | 0 refills | Status: DC

## 2017-04-29 MED ORDER — BACLOFEN 5 MG PO TABS: 5.0000 mg | ORAL_TABLET | Freq: Two times a day (BID) | ORAL | 0 refills | Status: DC

## 2017-04-29 MED ORDER — MULTIVITAMIN MEN 50+ PO TABS: 1.0000 | ORAL_TABLET | Freq: Every day | ORAL | 11 refills | Status: DC

## 2017-04-29 NOTE — Progress Notes (Signed)
Subjective:    Tyler Lester is a 72 y.o. old male here   HPI Pain in legs: this has been going on for two to three weeks. Pain in the back of his legs. Pain is sharp. Pain is on and off. Had similar pain before. Pain is worse with standing and starting walking. It improves with ambulation. Denies fever, chills, swelling, dyspnea or chest pain. He had hand and arm surgery about a week ago.  Left ear stuffed up: denies pain. Denies Q-tips.  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; CKD Stage 2; Depression; Bilateral foot pain; Back pain with sciatica; Postlaminectomy syndrome, cervical region; Left arm swelling; Gait abnormality; Rhinitis, allergic; Parkinson disease ([place]); Chronic back pain; Type 2 diabetes mellitus with complication ([place]); Diminished hearing, left; BPH with Refracotry Urinary Retention; Diabetic polyneuropathy associated with type 2 diabetes mellitus ([place]); Routine adult health maintenance; Leg swelling; PAD (peripheral artery disease) ([place]); Enrolled in chronic care management; Hyperlipidemia; Memory change; and Chronic bilateral low back pain with bilateral sciatica on their problem list.   has a past medical history of Bilateral foot pain, Chronic pain, Diabetes mellitus, Foley catheter in place (since nov 2018, last changed 1 week ago), Hyperlipidemia, Hypertension, Inguinal hernia, Numbness, Parkinson's disease ([place]), Pneumonia (yrs ago), Shortness of breath, and Urinary retention.  FH:  Family History  Problem Relation Age of Onset  . Cancer - Other Other   . Diabetes Other   . Hypertension Other     SH Social History   Tobacco Use  . Smoking status: Never Smoker  . Smokeless tobacco: Never Used  Substance Use Topics  . Alcohol use: Yes   Alcohol/week: 0.0 oz    Comment: 1 shot a day  . Drug use: No    Review of Systems Review of systems negative except for pertinent positives and negatives in history of present illness above.     Objective:     Vitals:   04/29/17 1335 04/29/17 1425  BP: (!) 144/108 130/90  Pulse: 77   Temp: 97.8 F (36.6 C)   TempSrc: Oral   SpO2: 99%   Weight: 203 lb (92.1 kg)   Height: 5' 9"  (1.753 m)    Body mass index is 29.98 kg/m.  Physical Exam  GEN: appears well, no apparent distress. CVS: RRR, nl s1 & s2, no murmurs, 1+ nonpitting edema bilaterally RESP: no IWOB, good air movement bilaterally, CTAB GI: BS present & normal, soft, NTND MSK: Left leg appear more swollen than right.  Right calf circumference 43 cm.  Left calf circumference 46 cm.  No tenderness to palpation.  DP and PT pulses not palpable due to his severe PAD.  No increased warmth to touch.  No tenderness to palpation.  No palpable cord. SKIN: shiny skin both legs bilaterally. Raised skin lesions on his legs bilaterally.  NEURO: alert and oiented appropriately.  Resting tremor in left arm.  PSYCH: euthymic mood with congruent affect    Assessment and Plan:  1. Left leg swelling: Unclear etiology.  Could be due to his chronic PAD.  However, I cannot rule out DVT given recent surgery and significant discrepancy of his calf circumferences (could be chronic).  He has no cardiopulmonary symptoms.  Not consistent with cellulitis. Recommended calling 911 or going to ED right away if he has cardiopulmonary symptoms. - Stat VAS [place] LOWER  EXTREMITY VENOUS (DVT). Scheduled for 9 am tomorrow morning. Advised him to arrive about 15 minutes early. - Rivaroxaban (XARELTO) 15 MG TABS tablet; Take 1 tablet (15 mg total) by mouth 2 (two) times daily with a meal.  Dispense: 7 tablet; Refill: 0.  -We will stop Xarelto if his DVT dopplers are negatve - D-dimer, quantitative (not at [facility]) - baclofen 5 MG TABS; Take 5 mg by mouth 2 (two)  times daily.  Dispense: 60 tablet; Refill: 0  2. Bilateral leg pain: Could be due to underlying PAD.  Could also be due to his parkinson disease. -Rule out DVT as above -Gave Rx for baclofen 5 mg twice daily (reduced) -He says Lyrica is not helping his pain.  So we will stop this today.  -Continue Norco.  2. Essential hypertension: Controlled.  Continue lisinopril and torsemide. - CMP14+EGFR  Return if symptoms worsen or fail to improve.  Mercy Riding, MD 04/29/17 Pager: [phone]   Precepted with Dr. Walker Kehr.

## 2017-04-29 NOTE — Patient Instructions (Addendum)
It was great seeing you today! We have addressed the following issues today  Leg pain: We sent a prescription for baclofen to your pharmacy.  We stopped your Lyrica as we discussed in the office.  We also ordered an ultrasound of your leg to exclude blood clotting.  This is a scheduled for tomorrow 04/30/17 at 9 AM at [facility].  Please show up at front desk at 8:45 AM for check in. We also gave you a blood thinner that you can take twice a day starting this evening until we exclude blood clotting.  Please call 911 if you have chest pain, shortness of breath, cough or other symptoms concerning to you.  If we did any lab work today, and the results require attention, either me or my nurse will get in touch with you. If everything is normal, you will get a letter in mail and a message via . If you don't hear from [place] in two weeks, please give [place] a call. Otherwise, we look forward to seeing you again at your next visit. If you have any questions or concerns before then, please call the clinic at [phone].  Please bring all your medications to every doctors visit  Sign up for My Chart to have easy access to your labs results, and communication with your Primary care physician.    Please check-out at the front desk before leaving the clinic.    Take Care,   Dr. Cyndia Skeeters

## 2017-04-30 ENCOUNTER — Ambulatory Visit (HOSPITAL_COMMUNITY): Admission: RE | Admit: 2017-04-30 | Payer: Medicare Other | Source: Ambulatory Visit

## 2017-04-30 ENCOUNTER — Telehealth: Payer: Self-pay | Admitting: Student

## 2017-04-30 DIAGNOSIS — M1990 Unspecified osteoarthritis, unspecified site: Secondary | ICD-10-CM | POA: Diagnosis not present

## 2017-04-30 DIAGNOSIS — I10 Essential (primary) hypertension: Secondary | ICD-10-CM | POA: Diagnosis not present

## 2017-04-30 LAB — CMP14+EGFR
ALT: 10 IU/L (ref 0–44)
AST: 19 IU/L (ref 0–40)
Albumin/Globulin Ratio: 1.3 (ref 1.2–2.2)
Albumin: 3.9 g/dL (ref 3.6–4.8)
Alkaline Phosphatase: 62 IU/L (ref 39–117)
BUN/Creatinine Ratio: 9 — ABNORMAL LOW (ref 10–24)
BUN: 13 mg/dL (ref 8–27)
Bilirubin Total: 0.2 mg/dL (ref 0.0–1.2)
CO2: 25 mmol/L (ref 20–29)
Calcium: 9 mg/dL (ref 8.6–10.2)
Chloride: 103 mmol/L (ref 96–106)
Creatinine, Ser: 1.52 mg/dL — ABNORMAL HIGH (ref 0.76–1.27)
GFR calc Af Amer: 53 mL/min/{1.73_m2} — ABNORMAL LOW (ref >59)
GFR calc non Af Amer: 46 mL/min/{1.73_m2} — ABNORMAL LOW (ref >59)
Globulin, Total: 3.1 g/dL (ref 1.5–4.5)
Glucose: 85 mg/dL (ref 65–99)
Potassium: 3.8 mmol/L (ref 3.5–5.2)
Sodium: 142 mmol/L (ref 134–144)
Total Protein: 7 g/dL (ref 6.0–8.5)

## 2017-04-30 LAB — D-DIMER, QUANTITATIVE (NOT AT ARMC): D-DIMER: 1.27 mg/L FEU — ABNORMAL HIGH (ref 0.00–0.49)

## 2017-04-30 NOTE — Telephone Encounter (Signed)
Called patient about his DVT doppler, which was ordered yesterday. Patient missed his LE DVT doppler this morning at 9 am. Patient reports oversleeping and missing the appointment. However, he called and rescheduled it for 05/07/2017. Unfortunately, we only gave him 7 pills of Xarelto 15 mg which would last him through 05/02/2017. I recommended coming to clinic to pick up more samples to last him through 05/07/17. He voiced understanding and agrees. His d-dimer is elevated to 1.27 which is hard to interpret given history of renal disease. Leg pain has improved. He denies cardiopulmonary symptoms. Recommended going to ED if he has difficulty breathing, cough, chest pain or other symptoms concerning to him. We may try rescheduling his doppler sooner if possible.

## 2017-05-06 DIAGNOSIS — M1990 Unspecified osteoarthritis, unspecified site: Secondary | ICD-10-CM | POA: Diagnosis not present

## 2017-05-06 DIAGNOSIS — G629 Polyneuropathy, unspecified: Secondary | ICD-10-CM | POA: Diagnosis not present

## 2017-05-07 ENCOUNTER — Ambulatory Visit (HOSPITAL_COMMUNITY): Payer: Medicare Other

## 2017-05-11 ENCOUNTER — Other Ambulatory Visit: Payer: Self-pay | Admitting: Student

## 2017-05-11 DIAGNOSIS — R600 Localized edema: Secondary | ICD-10-CM

## 2017-05-14 ENCOUNTER — Ambulatory Visit (HOSPITAL_COMMUNITY): Payer: Medicare Other

## 2017-05-15 ENCOUNTER — Telehealth: Payer: Self-pay | Admitting: Student

## 2017-05-15 NOTE — Telephone Encounter (Signed)
Pt came in office and dropped a SCAT application requesting to be filled and signed by MD. Last DOS 04-29-2017. Best phone # to call is [phone]. Form was placed in [place] team folder.

## 2017-05-16 NOTE — Telephone Encounter (Signed)
Form received however the Authorization to Release information needs to be completed before this can be filled out and sent to the fax number provided.  Placed form back in folder to be completed.  Spoke with Quita Skye to have him contact the pt to have him come fill this portion out. Katharina Caper, [name], [name]

## 2017-05-19 ENCOUNTER — Other Ambulatory Visit: Payer: Self-pay | Admitting: Student

## 2017-05-19 DIAGNOSIS — E1149 Type 2 diabetes mellitus with other diabetic neurological complication: Secondary | ICD-10-CM

## 2017-05-19 DIAGNOSIS — R0982 Postnasal drip: Secondary | ICD-10-CM

## 2017-05-20 ENCOUNTER — Telehealth: Payer: Self-pay

## 2017-05-20 DIAGNOSIS — Z9189 Other specified personal risk factors, not elsewhere classified: Secondary | ICD-10-CM

## 2017-05-20 NOTE — Telephone Encounter (Signed)
Bethann Punches with [facility] calling to speak with MD to request possible orders to patient to receive home health care for medication compliance. Joy's call back [phone] Wallace Cullens, RN

## 2017-05-20 NOTE — Telephone Encounter (Signed)
Face to face order for home RN placed

## 2017-05-20 NOTE — Telephone Encounter (Signed)
Face to face order for home RN placed. Please call and let Joy know.  Thanks, Bretta Bang

## 2017-05-21 NOTE — Telephone Encounter (Signed)
LVM for Ms Tyler Lester to return our call. Ottis Stain, CMA

## 2017-05-23 ENCOUNTER — Telehealth: Payer: Self-pay | Admitting: Student

## 2017-05-23 ENCOUNTER — Ambulatory Visit (HOSPITAL_COMMUNITY)
Admission: RE | Admit: 2017-05-23 | Discharge: 2017-05-23 | Disposition: A | Discharge status: Home / Self Care | Payer: Medicare Other | Source: Ambulatory Visit | Attending: Family Medicine | Admitting: Family Medicine

## 2017-05-23 DIAGNOSIS — M7989 Other specified soft tissue disorders: Secondary | ICD-10-CM | POA: Diagnosis not present

## 2017-05-23 DIAGNOSIS — E559 Vitamin D deficiency, unspecified: Secondary | ICD-10-CM

## 2017-05-23 NOTE — Progress Notes (Signed)
*  Preliminary Results* Bilateral lower extremity venous duplex completed. Bilateral lower extremities are negative for deep vein thrombosis. There is no evidence of Baker's cyst bilaterally.  05/23/2017 11:52 AM Maudry Mayhew, BS, RVT, RDCS, RDMS

## 2017-05-23 NOTE — Telephone Encounter (Signed)
Needs rx for Vitamin D Plus.  [facility] [address]

## 2017-05-24 MED ORDER — VITAMIN D 50 MCG (2000 UT) PO CAPS: 1.0000 | ORAL_CAPSULE | Freq: Every day | ORAL | 11 refills | Status: DC

## 2017-05-24 NOTE — Telephone Encounter (Signed)
LM for Mrs. Heard again. [place], Salome Spotted, CMA

## 2017-05-24 NOTE — Telephone Encounter (Signed)
Sent Rx for Vit D 2000 units to his pharmacy but this is OTC.

## 2017-05-26 ENCOUNTER — Other Ambulatory Visit: Payer: Self-pay | Admitting: Student

## 2017-05-26 DIAGNOSIS — I10 Essential (primary) hypertension: Secondary | ICD-10-CM

## 2017-05-26 DIAGNOSIS — I5032 Chronic diastolic (congestive) heart failure: Secondary | ICD-10-CM

## 2017-05-28 ENCOUNTER — Other Ambulatory Visit: Payer: Self-pay | Admitting: Student

## 2017-05-28 DIAGNOSIS — M7989 Other specified soft tissue disorders: Secondary | ICD-10-CM

## 2017-05-29 ENCOUNTER — Telehealth: Payer: Self-pay | Admitting: Student

## 2017-05-29 NOTE — Telephone Encounter (Signed)
Will await callback  Tyler Lester, Tyler Lester, Tyler Lester

## 2017-05-29 NOTE — Telephone Encounter (Signed)
Pt called about forms he dropped of for the dr to sign/fill out for him to get the SCAT transportation. He called them and said they had not received them yet. Please advise

## 2017-06-03 DIAGNOSIS — G629 Polyneuropathy, unspecified: Secondary | ICD-10-CM | POA: Diagnosis not present

## 2017-06-03 DIAGNOSIS — M1990 Unspecified osteoarthritis, unspecified site: Secondary | ICD-10-CM | POA: Diagnosis not present

## 2017-06-03 NOTE — Telephone Encounter (Signed)
Will forward to MD.  If this is faxed it would be placed in his box. Ronique Simerly, Salome Spotted, CMA

## 2017-06-03 NOTE — Telephone Encounter (Signed)
Forms were in white team folder, we were waiting for pt to come back to complete release of information page.  I placed forms in PCP box for completion.  However pt will need to come in and fill out the release of information form and sign giving [place] permission to fax it to [place].  Let pt know we will contact him to come fill it out once PCP completes the forms.  Routing to PCP.  Katharina Caper, [name], [name]

## 2017-06-03 NOTE — Telephone Encounter (Signed)
Pt called and said he called last week to check on his forms that he faxed. He said he was told that we did not receive them. He is going to resend them today

## 2017-06-05 NOTE — Telephone Encounter (Signed)
Reviewed, completed, and signed form. Placed completed form in RN's box. Note routed to RN team inbasket. Patient need to complete and sign his part.  Mercy Riding, MD

## 2017-06-06 NOTE — Telephone Encounter (Signed)
Attempted to call pt to inform him his forms are complete, but that he needs to come in and sign his portion of paperwork before we can fax. No answer or VM. Wallace Cullens, RN

## 2017-06-07 ENCOUNTER — Telehealth: Payer: Self-pay | Admitting: Student

## 2017-06-07 DIAGNOSIS — G20 Parkinson's disease: Secondary | ICD-10-CM | POA: Diagnosis not present

## 2017-06-07 NOTE — Telephone Encounter (Signed)
Pt is having severe swelling in his legs. He wanted Dr Cyndia Skeeters to know. I transferred to nurses line to discuss if he needed to be seen at the urgent care or ED since we had no appointments available.

## 2017-06-11 NOTE — Telephone Encounter (Signed)
Spoke to pt. Pt states swelling has gone down. I advised him that if the swelling starts again he needs to make an appt to be seen. Pt understands.  Ottis Stain, CMA

## 2017-06-11 NOTE — Telephone Encounter (Signed)
Please advise him to make an appointment if he continues to have worsening leg swelling.  Thanks! Bretta Bang

## 2017-06-18 DIAGNOSIS — I10 Essential (primary) hypertension: Secondary | ICD-10-CM | POA: Diagnosis not present

## 2017-06-18 NOTE — Telephone Encounter (Signed)
Pt came by and picked up his paperwork-aware to fill out his portion. Wallace Cullens, RN

## 2017-06-26 ENCOUNTER — Ambulatory Visit: Payer: Medicare Other | Admitting: Student

## 2017-06-27 ENCOUNTER — Ambulatory Visit (INDEPENDENT_AMBULATORY_CARE_PROVIDER_SITE_OTHER): Payer: Medicare Other | Admitting: Student

## 2017-06-27 ENCOUNTER — Encounter: Payer: Self-pay | Admitting: Student

## 2017-06-27 ENCOUNTER — Other Ambulatory Visit: Payer: Self-pay

## 2017-06-27 VITALS — BP 118/62 | HR 85 | Temp 97.9°F | Ht 69.0 in | Wt 200.0 lb

## 2017-06-27 DIAGNOSIS — Z7189 Other specified counseling: Secondary | ICD-10-CM

## 2017-06-27 DIAGNOSIS — G8929 Other chronic pain: Secondary | ICD-10-CM

## 2017-06-27 DIAGNOSIS — L989 Disorder of the skin and subcutaneous tissue, unspecified: Secondary | ICD-10-CM

## 2017-06-27 DIAGNOSIS — M79605 Pain in left leg: Secondary | ICD-10-CM | POA: Diagnosis not present

## 2017-06-27 DIAGNOSIS — M79604 Pain in right leg: Secondary | ICD-10-CM | POA: Diagnosis not present

## 2017-06-27 NOTE — Patient Instructions (Signed)
It was great seeing you today! We have addressed the following issues today  Leg swelling: Continue taking your torsemide.  I also recommend elevating the leg as much as possible.  Skin rash: We sent a referral to dermatology for this.  Someone will get in touch with you over the next 2 to 3 weeks about this.  Future care: As we discussed in the office, I strongly believe that you would benefit from getting your care from [facility].  I strongly recommend you to check them out.  Below is their information.  [facility] [address] [place], [place] [address] Office: [phone] FAX: [phone] [place]: [phone]   It sas been pleasure taking care of you. If you remain with [place], I recommend you come back and meet your new primary care doctor in 3 months.  If we did any lab work today, and the results require attention, either me or my nurse will get in touch with you. If everything is normal, you will get a letter in mail and a message via . If you don't hear from [place] in two weeks, please give [place] a call. Otherwise, we look forward to seeing you again at your next visit. If you have any questions or concerns before then, please call the clinic at [phone].  Please bring all your medications to every doctors visit  Sign up for My Chart to have easy access to your labs results, and communication with your Primary care physician.    Please check-out at the front desk before leaving the clinic.   Take Care,   Dr. Cyndia Skeeters

## 2017-06-27 NOTE — Progress Notes (Signed)
Subjective:    Tyler Lester is a 72 y.o. old male here to discuss about leg swelling and skin rash.  HPI Left leg swelling: Chronic issue.  He has severe PAD and venous insufficiency.  Recent DVT Doppler negative for DVT.  He has been on torsemide.  No significant acute change.  Denies pain, numbness, fever or chills.  Denies overlying skin erythema.  Skin rash: This is now there for over 12 months.  He says it started off with itching.  Gradually getting worse.  Rash is bilateral but mainly over his left lower extremity.  No aggravating or alleviating factor.  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; CKD Stage 2; Depression; Bilateral leg pain; Back pain with sciatica; Postlaminectomy syndrome, cervical region; Left arm swelling; Gait abnormality; Rhinitis, allergic; Parkinson disease ([place]); Chronic back pain; Type 2 diabetes mellitus with complication ([place]); Diminished hearing, left; BPH with Refracotry Urinary Retention; Diabetic polyneuropathy associated with type 2 diabetes mellitus ([place]); Routine adult health maintenance; Leg swelling; PAD (peripheral artery disease) ([place]); Enrolled in chronic care management; Hyperlipidemia; Memory change; Chronic bilateral low back pain with bilateral sciatica; and Care plan discussed with patient on their problem list.   has a past medical history of Bilateral foot pain, Chronic pain, Diabetes mellitus, Foley catheter in place (since nov 2018, last changed 1 week ago), Hyperlipidemia, Hypertension, Inguinal hernia, Numbness, Parkinson's disease ([place]), Pneumonia (yrs ago), Shortness of breath, and Urinary retention.  FH:  Family History  Problem Relation Age of Onset  . Cancer - Other Other   . Diabetes Other   . Hypertension Other      SH Social History   Tobacco Use  . Smoking status: Never Smoker  . Smokeless tobacco: Never Used  Substance Use Topics  . Alcohol use: Yes    Alcohol/week: 0.0 oz    Comment: 1 shot a day  . Drug use: No    Review of Systems Review of systems negative except for pertinent positives and negatives in history of present illness above.     Objective:     Vitals:   06/27/17 1521  BP: 118/62  Pulse: 85  Temp: 97.9 F (36.6 C)  TempSrc: Oral  SpO2: 96%  Weight: 200 lb (90.7 kg)  Height: 5\' 9"  (1.753 m)   Body mass index is 29.53 kg/m.  Physical Exam  GEN: appears well, no apparent distress. KZS:WFUX circulation in both legs (chronic) RESP: no IWOB MSK: Edema in his legs bilaterally, left > right. Motor 5/5 in both lower extremities. Light sensation diminished in both legs (chronic neuropathy). Resting tremor. SKIN:shiny skin both legs bilaterally. Raised skin lesions on his legs bilaterally with some notable neovasculirization.    PSYCH: euthymic mood with congruent affect     Assessment and Plan:  1. Bilateral leg pain: patient with chronic severe PAD and edema. His edema appears to be at baseline if not better today. He is on torsemide. Not a good candidate for compression stocking due to severe PAD. Recommended leg elevation and continuing his torsemide. His recent doppler for DVT is negative  2. Skin lesion of lower extremity: unclear etiology. No significant progression from the last time I saw him. Likely related to his severe PAD.  - Ambulatory referral to Dermatology  3. Care plan discussed with patient: I believe patient will benefit from [facility]. He  is a good candidate as well. I gave him information about [facility] and encouraged him to call and check it out.   Return in about 3 months (around 09/27/2017) for Meet new PCP.  Mercy Riding, MD 06/27/17 Pager: [phone]

## 2017-06-28 DIAGNOSIS — I10 Essential (primary) hypertension: Secondary | ICD-10-CM | POA: Diagnosis not present

## 2017-07-03 DIAGNOSIS — G4730 Sleep apnea, unspecified: Secondary | ICD-10-CM | POA: Diagnosis not present

## 2017-07-09 ENCOUNTER — Other Ambulatory Visit: Payer: Self-pay | Admitting: Student

## 2017-07-09 ENCOUNTER — Telehealth: Payer: Self-pay | Admitting: Student

## 2017-07-09 DIAGNOSIS — I8311 Varicose veins of right lower extremity with inflammation: Secondary | ICD-10-CM | POA: Diagnosis not present

## 2017-07-09 DIAGNOSIS — I8312 Varicose veins of left lower extremity with inflammation: Secondary | ICD-10-CM | POA: Diagnosis not present

## 2017-07-09 DIAGNOSIS — R600 Localized edema: Secondary | ICD-10-CM

## 2017-07-09 DIAGNOSIS — I872 Venous insufficiency (chronic) (peripheral): Secondary | ICD-10-CM | POA: Diagnosis not present

## 2017-07-09 MED ORDER — TORSEMIDE 20 MG PO TABS: 40.0000 mg | ORAL_TABLET | ORAL | 0 refills | Status: DC

## 2017-07-09 NOTE — Progress Notes (Signed)
Called and talked to patient and his partner. Tyler Lester has diastolic heart failure. However, I believe his leg swelling is due to venous stasis than his heart failure. We will increase his torsemide to 40 mg. BMP and BNP in a week. I emphasized the importance of the blood work to make sure that his electroytes and kidney numbers stay stable. Will update his Echo down the road.

## 2017-07-09 NOTE — Telephone Encounter (Signed)
Fransico Him called on behalf of this pt concerning his dermatology appt he had today. Felicia said the dermatologist said this pt has a lot of fluid on his legs, which is why his legs do not feel good all the time. The dermatologist thinks the pt has a heart problem and that is why he has fluid building up and thinks this pt needs to see a heart dr. [name] would like [place] to contact her to discuss all of this. Her number is [phone]. Please advise

## 2017-07-22 DIAGNOSIS — I10 Essential (primary) hypertension: Secondary | ICD-10-CM | POA: Diagnosis not present

## 2017-07-22 DIAGNOSIS — M1990 Unspecified osteoarthritis, unspecified site: Secondary | ICD-10-CM | POA: Diagnosis not present

## 2017-07-23 ENCOUNTER — Other Ambulatory Visit: Payer: Self-pay | Admitting: Student

## 2017-07-23 NOTE — Progress Notes (Signed)
Medication rec

## 2017-07-26 DIAGNOSIS — G629 Polyneuropathy, unspecified: Secondary | ICD-10-CM | POA: Diagnosis not present

## 2017-07-29 DIAGNOSIS — G20 Parkinson's disease: Secondary | ICD-10-CM | POA: Diagnosis not present

## 2017-07-31 ENCOUNTER — Ambulatory Visit: Payer: Medicare Other | Admitting: Student

## 2017-08-05 ENCOUNTER — Encounter: Payer: Self-pay | Admitting: Internal Medicine

## 2017-08-05 ENCOUNTER — Other Ambulatory Visit: Payer: Self-pay

## 2017-08-05 ENCOUNTER — Ambulatory Visit: Payer: Medicare Other | Admitting: Internal Medicine

## 2017-08-05 ENCOUNTER — Ambulatory Visit (INDEPENDENT_AMBULATORY_CARE_PROVIDER_SITE_OTHER): Payer: Medicare Other | Admitting: Internal Medicine

## 2017-08-05 DIAGNOSIS — B351 Tinea unguium: Secondary | ICD-10-CM | POA: Diagnosis not present

## 2017-08-05 MED ORDER — TERBINAFINE HCL 250 MG PO TABS: 250.0000 mg | ORAL_TABLET | Freq: Every day | ORAL | 0 refills | Status: DC

## 2017-08-05 NOTE — Assessment & Plan Note (Signed)
-   Ambulatory referral to Podiatry - Comprehensive metabolic panel - terbinafine (LAMISIL) 250 MG tablet; Take 1 tablet (250 mg total) by mouth daily.  Dispense: 90 tablet; Refill: 0

## 2017-08-05 NOTE — Progress Notes (Signed)
   [facility] Kerrin Mo, MD Phone: [phone]  Reason For Visit: Toenail Fungus   #Toenail Fungus  States she has had toenail fungus for a while.  He would like to get it treated.  He also would like to have his nails cut.  He denies any other symptoms associated with this.   Past Medical History Reviewed problem list.  Medications- reviewed and updated No additions to family history Social history- patient is a non-smoker   Objective: BP 132/62   Pulse 83   Temp 98.1 F (36.7 C) (Oral)   Ht 5\' 9"  (1.753 m)   Wt 198 lb 6.4 oz (90 kg)   SpO2 98%   BMI 29.30 kg/m  Gen: NAD, alert, cooperative with exam Skin: toenail fungus noted on bilateral feet thickened and discolored LE: Lower extremity edema noted   Assessment/Plan: See problem based a/p  Onychomycosis - Ambulatory referral to Podiatry - Comprehensive metabolic panel - terbinafine (LAMISIL) 250 MG tablet; Take 1 tablet (250 mg total) by mouth daily.  Dispense: 90 tablet; Refill: 0

## 2017-08-05 NOTE — Patient Instructions (Addendum)
I am going to start you on a medication for your toenail fungus called Lamisil.  You can take this medication for 12 weeks. I have sent you a referral to podiatry, please follow up with them to get your toenails clipped

## 2017-08-06 LAB — COMPREHENSIVE METABOLIC PANEL
ALT: 5 IU/L (ref 0–44)
AST: 21 IU/L (ref 0–40)
Albumin/Globulin Ratio: 1.3 (ref 1.2–2.2)
Albumin: 3.7 g/dL (ref 3.5–4.8)
Alkaline Phosphatase: 49 IU/L (ref 39–117)
BUN/Creatinine Ratio: 9 — ABNORMAL LOW (ref 10–24)
BUN: 13 mg/dL (ref 8–27)
Bilirubin Total: 0.4 mg/dL (ref 0.0–1.2)
CO2: 25 mmol/L (ref 20–29)
Calcium: 9.5 mg/dL (ref 8.6–10.2)
Chloride: 103 mmol/L (ref 96–106)
Creatinine, Ser: 1.44 mg/dL — ABNORMAL HIGH (ref 0.76–1.27)
GFR calc Af Amer: 56 mL/min/{1.73_m2} — ABNORMAL LOW (ref >59)
GFR calc non Af Amer: 49 mL/min/{1.73_m2} — ABNORMAL LOW (ref >59)
Globulin, Total: 2.9 g/dL (ref 1.5–4.5)
Glucose: 90 mg/dL (ref 65–99)
Potassium: 3.8 mmol/L (ref 3.5–5.2)
Sodium: 141 mmol/L (ref 134–144)
Total Protein: 6.6 g/dL (ref 6.0–8.5)

## 2017-08-07 ENCOUNTER — Encounter: Payer: Self-pay | Admitting: Internal Medicine

## 2017-08-13 ENCOUNTER — Telehealth: Payer: Self-pay

## 2017-08-13 NOTE — Telephone Encounter (Signed)
Bethann Punches with the [facility] left a message that she would like to see if PCP will order home health nursing to help patient with medication compliance and also PT.  States she had called several months ago about this but patient states never received services.  She is available to discuss further.  Call back is [phone].  Danley Danker, RN [facility] RN)

## 2017-08-14 NOTE — Telephone Encounter (Signed)
Attempted to call Ms. Heard. According to voicemail, she will return to office on Friday. I will call her back then. I am covering Dr. Juliann Pares box this week. Usually with home health referrals, I have to state the date face to face appointment. Unfortunately, I have not met this patient and therefore I do not think I will be able to place this right now. We may have to wait until Dr. Cyndia Skeeters returns.

## 2017-08-15 NOTE — Telephone Encounter (Signed)
Attempted to call Ms. Heard. Went to [facility] .

## 2017-08-17 ENCOUNTER — Other Ambulatory Visit: Payer: Self-pay | Admitting: Student

## 2017-08-17 DIAGNOSIS — G20 Parkinson's disease: Secondary | ICD-10-CM

## 2017-08-17 DIAGNOSIS — G20A1 Parkinson's disease without dyskinesia, without mention of fluctuations: Secondary | ICD-10-CM

## 2017-08-17 DIAGNOSIS — Z9189 Other specified personal risk factors, not elsewhere classified: Secondary | ICD-10-CM

## 2017-08-17 DIAGNOSIS — Z789 Other specified health status: Secondary | ICD-10-CM

## 2017-08-17 DIAGNOSIS — R269 Unspecified abnormalities of gait and mobility: Secondary | ICD-10-CM

## 2017-08-17 NOTE — Telephone Encounter (Signed)
Home health orders including PT/OT and RN ordered. Please call Ms. Heard and let her know. Thank you. Lashara Urey

## 2017-08-19 NOTE — Telephone Encounter (Signed)
Called Ms. Heard. Left detailed message (per voicemail). If she calls, please give her the information below or have her speak with Nehemiah Settle.Ottis Stain, CMA

## 2017-08-22 DIAGNOSIS — M1990 Unspecified osteoarthritis, unspecified site: Secondary | ICD-10-CM | POA: Diagnosis not present

## 2017-08-28 ENCOUNTER — Ambulatory Visit: Payer: Medicare Other | Admitting: Podiatry

## 2017-08-28 DIAGNOSIS — G20 Parkinson's disease: Secondary | ICD-10-CM | POA: Diagnosis not present

## 2017-08-28 DIAGNOSIS — K219 Gastro-esophageal reflux disease without esophagitis: Secondary | ICD-10-CM | POA: Diagnosis not present

## 2017-09-03 DIAGNOSIS — G629 Polyneuropathy, unspecified: Secondary | ICD-10-CM | POA: Diagnosis not present

## 2017-09-12 ENCOUNTER — Ambulatory Visit (INDEPENDENT_AMBULATORY_CARE_PROVIDER_SITE_OTHER): Payer: Medicare Other | Admitting: Family Medicine

## 2017-09-12 ENCOUNTER — Encounter: Payer: Self-pay | Admitting: Family Medicine

## 2017-09-12 ENCOUNTER — Other Ambulatory Visit: Payer: Self-pay

## 2017-09-12 VITALS — BP 114/78 | HR 82 | Temp 98.2°F | Wt 193.0 lb

## 2017-09-12 DIAGNOSIS — M7989 Other specified soft tissue disorders: Secondary | ICD-10-CM | POA: Diagnosis not present

## 2017-09-12 DIAGNOSIS — R600 Localized edema: Secondary | ICD-10-CM

## 2017-09-12 MED ORDER — TORSEMIDE 20 MG PO TABS: 60.0000 mg | ORAL_TABLET | ORAL | 0 refills | Status: DC

## 2017-09-12 NOTE — Patient Instructions (Addendum)
Increase torsemide to 60mg  a day. Follow up in 1 week with your doctor to repeat bloodwork.   Call [facility]. Address: [address] [place], [place], [place] [address]   Phone: [phone]

## 2017-09-12 NOTE — Assessment & Plan Note (Signed)
Chronic issue without acute worsening.  Per chart review he has had some improvement with increased torsemide in the past.  He does not appear to be in a diastolic heart failure exacerbation as he has no cardiopulmonary symptoms.  His dry weight appears to be around 188-189.  He is 193 pounds today.  Trial of increase torsemide from 40 mg daily to 60 mg daily.  Unfortunately he is not able to tolerate compression stockings due to severe PAD per chart review.  Discussed follow-up with PCP in 1 week with repeat BMP at that time.  He may need a repeat echo.  Also advised patient contact [facility] dermatology follow-up as he was referred there for skin blisters and may and he does not recall what he was told at his last visit.

## 2017-09-12 NOTE — Progress Notes (Signed)
    Subjective:  Tyler Lester is a 72 y.o. male who presents to the [facility] today with a chief complaint of leg swelling.   HPI:  Patient here with chronic bilateral leg swelling in the setting of severe PAD and venous insufficiency.  He was last seen for this on 06/27/2017 at which time he was checked for DVT which was negative, felt to be secondary to venous stasis.  His torsemide was increased to 40 mg daily on 07/09/2017. He is here today because his leg swelling and pain has not improved despite being on the increased dose of torsemide.  He says that his leg swelling and blisters on his legs have not changed over the last 2 months.   He does not have any shortness of breath, orthopnea, chest pain, fevers, chills, increased redness of his legs, image from his blisters.  He is not able to be on compression stockings due to his severe PAD.  ROS: Per HPI   Objective:  Physical Exam: BP 114/78   Pulse 82   Temp 98.2 F (36.8 C) (Oral)   Wt 193 lb (87.5 kg)   SpO2 94%   BMI 28.50 kg/m   Gen: NAD, resting comfortably CV: RRR with no murmurs appreciated, no JVD Pulm: NWOB, CTAB with no crackles, wheezes, or rhonchi GI: Normal bowel sounds present. Soft, Nontender, Nondistended. MSK: 2+ pitting edema to knees bilaterally, equal in both legs. Skin: Skin of both legs are shiny with raised blisters as per below.  No erythema or drainage Neuro: grossly normal, moves all extremities Psych: Normal affect and thought content      Assessment/Plan:  Leg swelling Chronic issue without acute worsening.  Per chart review he has had some improvement with increased torsemide in the past.  He does not appear to be in a diastolic heart failure exacerbation as he has no cardiopulmonary symptoms.  His dry weight appears to be around 188-189.  He is 193 pounds today.  Trial of increase torsemide from 40 mg daily to 60 mg daily.  Unfortunately he is not able to tolerate compression stockings due to  severe PAD per chart review.  Discussed follow-up with PCP in 1 week with repeat BMP at that time.  He may need a repeat echo.  Also advised patient contact [facility] dermatology follow-up as he was referred there for skin blisters and may and he does not recall what he was told at his last visit.   Bufford Lope, DO PGY-3, [facility] 09/12/2017 11:53 AM

## 2017-09-16 DIAGNOSIS — E559 Vitamin D deficiency, unspecified: Secondary | ICD-10-CM | POA: Diagnosis not present

## 2017-09-16 DIAGNOSIS — M79675 Pain in left toe(s): Secondary | ICD-10-CM | POA: Diagnosis not present

## 2017-09-16 DIAGNOSIS — R600 Localized edema: Secondary | ICD-10-CM | POA: Diagnosis not present

## 2017-09-16 DIAGNOSIS — Z79899 Other long term (current) drug therapy: Secondary | ICD-10-CM | POA: Diagnosis not present

## 2017-09-16 DIAGNOSIS — E7800 Pure hypercholesterolemia, unspecified: Secondary | ICD-10-CM | POA: Diagnosis not present

## 2017-09-16 DIAGNOSIS — Z0000 Encounter for general adult medical examination without abnormal findings: Secondary | ICD-10-CM | POA: Diagnosis not present

## 2017-09-16 DIAGNOSIS — M79674 Pain in right toe(s): Secondary | ICD-10-CM | POA: Diagnosis not present

## 2017-09-16 DIAGNOSIS — M5442 Lumbago with sciatica, left side: Secondary | ICD-10-CM | POA: Diagnosis not present

## 2017-09-16 DIAGNOSIS — R5383 Other fatigue: Secondary | ICD-10-CM | POA: Diagnosis not present

## 2017-09-16 DIAGNOSIS — M129 Arthropathy, unspecified: Secondary | ICD-10-CM | POA: Diagnosis not present

## 2017-09-19 DIAGNOSIS — M79605 Pain in left leg: Secondary | ICD-10-CM | POA: Diagnosis not present

## 2017-09-19 DIAGNOSIS — R0989 Other specified symptoms and signs involving the circulatory and respiratory systems: Secondary | ICD-10-CM | POA: Diagnosis not present

## 2017-09-19 DIAGNOSIS — M79604 Pain in right leg: Secondary | ICD-10-CM | POA: Diagnosis not present

## 2017-09-19 DIAGNOSIS — Z136 Encounter for screening for cardiovascular disorders: Secondary | ICD-10-CM | POA: Diagnosis not present

## 2017-09-23 DIAGNOSIS — M5442 Lumbago with sciatica, left side: Secondary | ICD-10-CM | POA: Diagnosis not present

## 2017-09-23 DIAGNOSIS — R768 Other specified abnormal immunological findings in serum: Secondary | ICD-10-CM | POA: Diagnosis not present

## 2017-09-23 DIAGNOSIS — M5441 Lumbago with sciatica, right side: Secondary | ICD-10-CM | POA: Diagnosis not present

## 2017-09-23 DIAGNOSIS — Z79899 Other long term (current) drug therapy: Secondary | ICD-10-CM | POA: Diagnosis not present

## 2017-09-23 DIAGNOSIS — G8929 Other chronic pain: Secondary | ICD-10-CM | POA: Diagnosis not present

## 2017-09-23 DIAGNOSIS — E7800 Pure hypercholesterolemia, unspecified: Secondary | ICD-10-CM | POA: Diagnosis not present

## 2017-09-25 ENCOUNTER — Encounter

## 2017-09-25 ENCOUNTER — Ambulatory Visit: Payer: Medicare Other | Admitting: Podiatry

## 2017-09-25 DIAGNOSIS — I10 Essential (primary) hypertension: Secondary | ICD-10-CM | POA: Diagnosis not present

## 2017-10-01 ENCOUNTER — Other Ambulatory Visit: Payer: Self-pay

## 2017-10-02 DIAGNOSIS — R7303 Prediabetes: Secondary | ICD-10-CM | POA: Diagnosis not present

## 2017-10-02 DIAGNOSIS — M79675 Pain in left toe(s): Secondary | ICD-10-CM | POA: Diagnosis not present

## 2017-10-02 DIAGNOSIS — B351 Tinea unguium: Secondary | ICD-10-CM | POA: Diagnosis not present

## 2017-10-02 DIAGNOSIS — M79674 Pain in right toe(s): Secondary | ICD-10-CM | POA: Diagnosis not present

## 2017-10-14 ENCOUNTER — Ambulatory Visit: Payer: Medicare Other | Admitting: Family Medicine

## 2017-10-14 ENCOUNTER — Other Ambulatory Visit: Payer: Self-pay

## 2017-10-14 NOTE — Patient Outreach (Signed)
[facility]) Care Management  10/14/2017  Tyler Lester 1945/09/24 [id number]   TELEPHONE SCREENING Referral date: 10/01/17 Referral source:  EMMI prevent Referral reason: EMMI prevent score Insurance: [place] health care  Telephone call to patient regarding EMMI prevent referral. HIPAA verified with patient. Explained reason for call.  Discussed and offered [facility] Care management services. Patient declined services. Patient states he has a nurse 7 days per week.  Patient states his nurse helps him with bathing, house keeping, meals and errands.  Patient states he does not need another nurse or [facility] care management services. Patient states he is only interested in getting information regarding exercise. Patient states he has Parkinson's disease and just needs to move.  Patient denies any falls and / or hospital or ED visits within the past 3 months. Patient states he has  A walker and cane for ambulation. RNCM gave patient contact phone number for  Parkinson exercise classes.  Also advised patient to contact his [place] representative for  [employer] program.    PLAN; RNCM will close patient due to patient refusing services.  RNCM will send closure notification to patients primary MD.  [facility] will send patient [facility] Brochure/ magnet  Quinn Plowman RN,BSN,CCM [facility] Telephonic  [phone]

## 2017-11-07 DIAGNOSIS — R600 Localized edema: Secondary | ICD-10-CM | POA: Diagnosis not present

## 2017-11-21 DIAGNOSIS — I10 Essential (primary) hypertension: Secondary | ICD-10-CM | POA: Diagnosis not present

## 2017-11-27 ENCOUNTER — Encounter: Payer: Self-pay | Admitting: Neurology

## 2017-11-27 ENCOUNTER — Ambulatory Visit (INDEPENDENT_AMBULATORY_CARE_PROVIDER_SITE_OTHER): Payer: Medicare Other | Admitting: Neurology

## 2017-11-27 VITALS — BP 134/77 | HR 82 | Ht 69.0 in | Wt 189.0 lb

## 2017-11-27 DIAGNOSIS — G20 Parkinson's disease: Secondary | ICD-10-CM

## 2017-11-27 DIAGNOSIS — E538 Deficiency of other specified B group vitamins: Secondary | ICD-10-CM

## 2017-11-27 DIAGNOSIS — F0280 Dementia in other diseases classified elsewhere without behavioral disturbance: Secondary | ICD-10-CM

## 2017-11-27 MED ORDER — DONEPEZIL HCL 10 MG PO TABS: 10.0000 mg | ORAL_TABLET | Freq: Every day | ORAL | 6 refills | Status: DC

## 2017-11-27 NOTE — Patient Instructions (Addendum)
Parkinson's disease: Sinemet(Carbidopa/Levodopa) Continue 2 tab three times a day. Take 3-4 hours apart at 8am, noon and 4pm for example. Do not take at the same time as protein.  Try to separate Sinemet from food (especially protein-rich foods like meat, dairy, eggs) by about 30-60 mins - this will help the absorption of the medication. If you have some nausea with the medication, you can take it with some light food like crackers or ginger ale    Parkinson Disease Parkinson disease is a long-term (chronic) condition. It gets worse over time (is progressive). Parkinson disease limits your ability:  To control how your body moves.  To move your body normally.  This condition affects each person differently. The condition can range from mild to very bad. This condition tends to progress slowly over many years. Follow these instructions at home:  Take over-the-counter and prescription medicines only as told by your doctor.  Put grab bars and railings in your home. These help to prevent falls.  Follow instructions from your doctor about what you can or cannot eat or drink.  Go back to your normal activities as told by your doctor. Ask your doctor what activities are safe for you.  Exercise as told by your doctor or physical therapist.  Keep all follow-up visits as told by your doctor. This is important. These include any visits with a speech or occupational therapist.  Think about joining a support group for people who have Parkinson disease. Contact a doctor if:  Medicines do not help your symptoms.  You feel off-balance.  You fall at home.  You need more help at home.  You have: ? Trouble swallowing. ? A very hard time pooping (constipation). ? A lot of side effects from your medicines.  You see or hear things that are not real (hallucinate).  You feel: ? Confused. ? Anxious. ? Sad (depressed). Get help right away if:  You were hurt in a fall.  You cannot swallow  without choking.  You have chest pain.  You have trouble breathing.  You do not feel safe at home. This information is not intended to replace advice given to you by your health care provider. Make sure you discuss any questions you have with your health care provider. Document Released: 05/07/2011 Document Revised: 07/21/2015 Document Reviewed: 12/03/2014 Elsevier Interactive Patient Education  [employer].

## 2017-11-27 NOTE — Progress Notes (Signed)
[facility]   Provider: Dr Jaynee Eagles Referring Provider: Sandi Mariscal MD Primary Care Physician: Sandi Mariscal  MD  CC: Parkinson's disease  Interval history 11/27/2017: Patient is here for Parkinson's Disease. Unfortunately in the past he has been non compliant with medication and management plan and also refused help such as [facility] to help with his medication management. He did not come back because I discussed I couldn't help him. Today he returns and wonderfully he had a nurse/caregiver with him who is there at his home and helps with medication management.  No falls in the home. Using a walker. No swallowing difficulties. No hallucinations or elusions. No drooling. No dizziness, not lightheaded, good appetite. He has a tremor, Sinemet helps when he takes. He denies rem sleep disorder. Sleeping well. During the day awake and alert.    HPI: Tyler Lester is a 72 y.o. male here as a follow up for Parkinsonism, memory loss, gait ataxia, non-compliance. Memory problems started years ago. The tremor is more at rest. And the jaw trembles as well.  Feels like his voice is getting softer. He has vivid dreams. He has drool on his pillow. He has fallen a few times and he catches himslef. He has loss of smell and taste. He can't smell dinner. He is swallowing OK. His handwriting has gotten smaller, when he writes memos he has noticed he has gotten smaller.   Interval history 02/15/2015: He does not know what medications he is taking. He has refused [facility] or home health care to help him with medications. He is a poor historian. I have asked him multiple times to bring in his medication bottles and he always forgets. i had a frank conversation with patient, I cannot manage his parkinsonism in this manner. He is supposed to be on Sinemet and Azilect. Called the pharmacy and he has not filled [place] since July. Sinemet was last filled on 12/5 but before then he filled it in September for a  month. Not compliant.  Interval history 11/17/2014: He is having neck pain. He was scheduled today to discuss memory loss and he was supposed to bring a family member but he forgot. He would like pain medication and gets Vicadin but Dr. Lonny Prude won't give hm anymore because he had alcohol in his urine. He is not wearing his neupro patch today. I am not sure he is taking his medications. He declines any services to help him in the house. He has not started the Lyrica. He never got the MRI of the brain completed that I ordered last December. He says the neupro patch doesn't work and gives him a lot of urges to have sex.  Interval History 03/22/2014: the tremor continues. He appears to be a poor historian and doesn't quite know what medications he is taking. He is not having any side effects from the Azilect and says he believes he is taking it. He also has neck pain and low back pain. He is on pain medication and is waiting to be seen by pain management. He had recent cervical spine surgery and has had lumber spine surgery in the past. He is having shooting pain from low back into right leg that is chronic and he is being managed by orthopaedic doctor. The orthopedist cancelled his pain medication because he was already getting vicodin from dr. Teryl Lucy and patient wants to know if I can give him the percocet instead of vicodin. He is only taking 200mg  three times daily (at  first he said just once daily) of neurontin. No side effects from the gabapentin.   02/03/2014: Tyler Lester is a 72 y.o. male here as a referral from Dr. Lonny Prude for Tremor. PMHx cervical fusion (September), chronic back pain, DM, HTN, fatigue, depression, anxiety, myelopathy.   Was sent here by Dr. Teryl Lucy for evaluation of tremor. He has a tremor in his left hand and his jaw. Tremor has only been going on for at least a year but worse in the last 3 months. Getting worse after neck surgery. Worse in the left but also in the right hand as  well as the chin. He can't walk, he has chronic pain in his low back and neck. Walking slow, doesn't have energy. No tremors in other family members. Doesn't notice the tremor getting better with alcohol. No constipation. Can hardly smell. Sometimes he is talking low, softer. Hand writing is shaky, smaller. Feels weak. TSH was normal in June. Does not drink caffeine. 1-2 drinks a week, no significant alcohol. No FHx of parkinson's or neurodegenerative disease. Has had falls.   Review of Systems: Patient complains of symptoms per HPI as well as the following symptoms: No CP, no SOB. Pertinent negatives per HPI. All others negative.   Social History   Socioeconomic History  . Marital status: Single    Spouse name: Not on file  . Number of children: 2  . Years of education: 49  . Highest education level: Not on file  Occupational History  . Occupation: Retired     Comment: Event organiser at [employer]  . Financial resource strain: Not on file  . Food insecurity:    Worry: Not on file    Inability: Not on file  . Transportation needs:    Medical: Not on file    Non-medical: Not on file  Tobacco Use  . Smoking status: Never Smoker  . Smokeless tobacco: Never Used  Substance and Sexual Activity  . Alcohol use: Yes    Alcohol/week: 0.0 standard drinks    Comment: 1 shot a day  . Drug use: No  . Sexual activity: Not on file  Lifestyle  . Physical activity:    Days per week: Not on file    Minutes per session: Not on file  . Stress: Not on file  Relationships  . Social connections:    Talks on phone: Not on file    Gets together: Not on file    Attends religious service: Not on file    Active member of club or organization: Not on file    Attends meetings of clubs or organizations: Not on file    Relationship status: Not on file  . Intimate partner violence:    Fear of current or ex partner: Not on file    Emotionally abused: Not on file    Physically abused: Not  on file    Forced sexual activity: Not on file  Other Topics Concern  . Not on file  Social History Narrative   Patient lives at home alone.    Patient have 2 children.    Patient has a high school education    Patient is right handed.    Patient is retired on disability.     Family History  Problem Relation Age of Onset  . Cancer - Other Other   . Diabetes Other   . Hypertension Other     Past Medical History:  Diagnosis Date  . Bilateral foot pain   .  Chronic pain   . Diabetes mellitus    type 2, diet controlled now  . Foley catheter in place since nov 2018, last changed 1 week ago  . Hyperlipidemia   . Hypertension   . Inguinal hernia    right  . Numbness    T/O  . Parkinson's disease ([place])   . Pneumonia yrs ago  . Shortness of breath    with exertion  . Urinary retention     Past Surgical History:  Procedure Laterality Date  . CATARACT EXTRACTION W/ INTRAOCULAR LENS IMPLANT Left 06/2012   laser for posterior capsule?  . COLON SURGERY    . POSTERIOR CERVICAL FUSION/FORAMINOTOMY N/A 11/12/2013   Procedure: Posterior cervical decompression fusion, cervical 3-4, cervical 4-5, cervical 5-6, cervical 6-7 with instrumentation and allograft   (LEVEL 4);  Surgeon: Sinclair Ship, MD;  Location: [facility];  Service: Orthopedics;  Laterality: N/A;  Posterior cervical decompression fusion, cervical 3-4, cervical 4-5, cervical 5-6, cervical 6-7 with instrumentation and allograft  . TONSILLECTOMY    . XI ROBOTIC ASSISTED SIMPLE PROSTATECTOMY N/A 03/07/2016   Procedure: XI ROBOTIC ASSISTED SIMPLE PROSTATECTOMY AND UMBILICAL HERNIA REPAIR flexible cystoscopy;  Surgeon: Alexis Frock, MD;  Location: [facility];  Service: Urology;  Laterality: N/A;    Current Outpatient Medications  Medication Sig Dispense Refill  . aspirin EC 81 MG tablet Take 1 tablet (81 mg total) by mouth daily. 30 tablet 11  . atorvastatin (LIPITOR) 20 MG tablet TAKE ONE TABLET BY MOUTH EVERY DAY 30 tablet  11  . Baclofen 5 MG TABS TAKE 1 TABLET BY MOUTH TWICE DAILY. 60 tablet 0  . carbidopa-levodopa (SINEMET) 25-100 MG tablet Take 2 tablets 3 (three) times daily by mouth. Take 4 hours apart at 8am, noon and 4pm for example. Avoid high protein diet. 540 tablet 3  . Cholecalciferol (VITAMIN D) 2000 units CAPS Take 1 capsule (2,000 Units total) by mouth daily. 90 capsule 11  . fluticasone (FLONASE) 50 MCG/ACT nasal spray Use 2 sprays in each nostril every day 16 g 11  . lisinopril (PRINIVIL,ZESTRIL) 20 MG tablet TAKE ONE TABLET BY MOUTH EVERY DAY 30 tablet 11  . LYRICA 75 MG capsule TK ONE C PO BID  4  . Multiple Vitamins-Minerals (MULTIVITAMIN MEN 50+) TABS Take 1 tablet by mouth daily. 90 tablet 11  . NONFORMULARY OR COMPOUNDED ITEM [facility]:  Peripheral Neuropathy cream - Bupivacaine 1%, Doxepin 3%, Gabapentin 6%, Pentoxifylline 3%, Topiramate 1%, apply 1-2 grams to affected area 3-4 times daily. 120 each 2  . omeprazole (PRILOSEC) 20 MG capsule Take 1 capsule (20 mg total) by mouth daily. 90 capsule 0  . oxyCODONE-acetaminophen (PERCOCET) 7.5-325 MG tablet TK 1 T PO Q 6 H PRN FOR PAIN  0  . polyethylene glycol powder (GLYCOLAX/MIRALAX) powder Take 17 g by mouth 2 (two) times daily as needed. 3350 g 1  . terbinafine (LAMISIL) 250 MG tablet Take 1 tablet (250 mg total) by mouth daily. 90 tablet 0  . torsemide (DEMADEX) 20 MG tablet Take 3 tablets (60 mg total) by mouth every morning. 180 tablet 0  . allopurinol (ZYLOPRIM) 300 MG tablet Take 300 mg by mouth daily.    Marland Kitchen donepezil (ARICEPT) 10 MG tablet Take 1 tablet (10 mg total) by mouth daily. 30 tablet 6  . naloxone (NARCAN) 2 MG/2ML injection Inject 0.4 mLs (0.4 mg total) into the muscle as needed (opiod overdose. May repeat in two to three minutes.). 2 mL 1  . senna-docusate (  SENOKOT-S) 8.6-50 MG tablet Take 1 tablet by mouth 2 (two) times daily. While taking strong pain meds to prevent constipation. (Patient not taking: Reported on  11/27/2017) 30 tablet 0  . SSD 1 % cream APPLY TOPICALLY DAILY 50 g 0   No current facility-administered medications for this visit.    Facility-Administered Medications Ordered in Other Visits  Medication Dose Route Frequency Provider Last Rate Last Dose  . magnesium citrate solution 1 Bottle  1 Bottle Oral Once Alexis Frock, MD        Allergies as of 11/27/2017 - Review Complete 11/27/2017  Allergen Reaction Noted  . Poison ivy treatments Hives 05/14/2014    Vitals: BP 134/77 (BP Location: Right Arm, Patient Position: Sitting)   Pulse 82   Ht 5\' 9"  (1.753 m)   Wt 189 lb (85.7 kg)   BMI 27.91 kg/m  Last Weight:  Wt Readings from Last 1 Encounters:  11/27/17 189 lb (85.7 kg)   Last Height:   Ht Readings from Last 1 Encounters:  11/27/17 5\' 9"  (1.753 m)     Speech: hypophonic. No aphasia, not dysarthric Cognition:  The patient is oriented to person, place, and time;   Cranial Nerves: Hypomimia  The pupils are equal, round, and reactive to light. Visual fields are full to finger confrontation. Extraocular movements are intact. Trigeminal sensation is intact and the muscles of mastication are normal. The face is symmetric. The palate elevates in the midline. Voice is normal. Shoulder shrug is normal. The tongue has normal motion without fasciculations.   Coordination: Bradykinesias left > right finger tapping, slow but intact FTN, no dysmetria  Gait:  shuffling, stooped, bradykinetic, en-bloc turning, decreased arm swing   Motor Observation: Left>R resting tremor and chin tremor. Decreased blink. Postural tremor.    Tone: +cogwheeling   Assessment/Plan: 72 year old male here for follow up of Parkinson's Disease. Has been non compliant in the past but now has a caregiver. PMHx cervical fusion (September), chronic back pain, DM, HTN, fatigue, depression, anxiety, myelopathy.   Parkinson's disease: Sinemet(Carbidopa/Levodopa) Continue 2 tab three times a  day. Take 3-4 hours apart at 8am, noon and 4pm for example. Do not take at the same time as protein.  Try to separate Sinemet from food (especially protein-rich foods like meat, dairy, eggs) by about 30-60 mins - this will help the absorption of the medication. If you have some nausea with the medication, you can take it with some light food like crackers or ginger ale  Provided education on PD, online resources, groups in the area, resources in the area such as exercise classes.  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 Unclear at this time how much medications such as Aricept will help. Will start.  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 B12 and Folate Panel  . Methylmalonic acid, serum  . RPR  . Ambulatory referral to Social Work   Meds ordered this encounter  Medications  . donepezil (ARICEPT) 10 MG tablet    Sig: Take 1 tablet (10 mg total) by mouth daily.    Dispense:  30 tablet    Refill:  Desert Aire, MD  [facility] [address] [place], [place] [address]  Phone [phone] Fax [phone]  A total of 30  minutes was spent face-to-face with this patient. Over half this time was spent on counseling patient on the  1. Parkinson's disease ([place])   2. B12 deficiency   3. 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   Cc: Dr. Nancy Fetter

## 2017-11-28 ENCOUNTER — Telehealth: Payer: Self-pay | Admitting: Psychology

## 2017-11-28 DIAGNOSIS — R7303 Prediabetes: Secondary | ICD-10-CM | POA: Diagnosis not present

## 2017-11-28 DIAGNOSIS — M5442 Lumbago with sciatica, left side: Secondary | ICD-10-CM | POA: Diagnosis not present

## 2017-11-28 DIAGNOSIS — M5441 Lumbago with sciatica, right side: Secondary | ICD-10-CM | POA: Diagnosis not present

## 2017-11-28 DIAGNOSIS — G8929 Other chronic pain: Secondary | ICD-10-CM | POA: Diagnosis not present

## 2017-11-28 DIAGNOSIS — Z23 Encounter for immunization: Secondary | ICD-10-CM | POA: Diagnosis not present

## 2017-11-28 NOTE — Telephone Encounter (Signed)
Received referral to see patient and reviewed chart. Patient has PD with possibly some dementia.   I contacted patient and left a message introducing myself and stating that Dr. Jaynee Eagles wanted me to see patient to provide resources.  My direct call back number of [phone] was left on the message.

## 2017-11-29 DIAGNOSIS — G20 Parkinson's disease: Secondary | ICD-10-CM | POA: Diagnosis not present

## 2017-11-30 LAB — METHYLMALONIC ACID, SERUM: Methylmalonic Acid: 303 nmol/L (ref 0–378)

## 2017-11-30 LAB — B12 AND FOLATE PANEL
Folate: 9.5 ng/mL (ref >3.0)
Vitamin B-12: 835 pg/mL (ref 232–1245)

## 2017-11-30 LAB — RPR: RPR Ser Ql: NONREACTIVE

## 2017-12-03 ENCOUNTER — Telehealth: Payer: Self-pay | Admitting: Neurology

## 2017-12-03 ENCOUNTER — Other Ambulatory Visit: Payer: Self-pay

## 2017-12-03 ENCOUNTER — Telehealth: Payer: Self-pay | Admitting: *Deleted

## 2017-12-03 DIAGNOSIS — G20 Parkinson's disease: Secondary | ICD-10-CM

## 2017-12-03 MED ORDER — CARBIDOPA-LEVODOPA 25-100 MG PO TABS: 2.0000 | ORAL_TABLET | Freq: Three times a day (TID) | ORAL | 3 refills | Status: DC

## 2017-12-03 NOTE — Telephone Encounter (Signed)
Spoke with pt. He is aware that his labs are normal. He had no questions. Pt verbalized appreciation.

## 2017-12-03 NOTE — Telephone Encounter (Signed)
-----   Message from Melvenia Beam, MD sent at 12/02/2017  4:27 PM EDT ----- Labs normal thanks

## 2017-12-03 NOTE — Telephone Encounter (Signed)
Patient requesting refill of carbidopa-levodopa (SINEMET) 25-100 MG tablet sent to [facility] on [address] @ [address]

## 2017-12-03 NOTE — Telephone Encounter (Signed)
Script has [facility] to [facility]

## 2017-12-04 ENCOUNTER — Telehealth: Payer: Self-pay | Admitting: Family Medicine

## 2017-12-04 NOTE — Telephone Encounter (Signed)
Pt said he wants a urine cup left up front for him to pick up. Pt wouldn't give anymore information.

## 2017-12-06 ENCOUNTER — Telehealth: Payer: Self-pay | Admitting: Psychology

## 2017-12-06 NOTE — Telephone Encounter (Signed)
Informed pt that I will place the male urinal up front for him to pick up but that he will have to purchase them from the pharmacy if he needs another one.  Confirmed with Janett Billow that it was ok to give him this one. Katharina Caper, [name], [name]

## 2017-12-06 NOTE — Telephone Encounter (Signed)
TC with patient . He is scheduled to come into the office to meet me on 10/14 at 11am. He will bring a caregiver with him.

## 2017-12-12 ENCOUNTER — Other Ambulatory Visit: Payer: Self-pay | Admitting: Pharmacist

## 2017-12-12 NOTE — Patient Outreach (Signed)
[facility]) Care Management  12/12/2017  Tyler Lester [birth year] [id number]   72 year old male referred to [facility] for medication review due to High Risk review of [employer] beneficiaries. PMHx includes, but not limited to, T2DM, HTN, PAD, CHF, GERD, Parkinson Disease   Patient was contacted today to review medications due to multiple [place] codes (DM, HTN) and elevated RAF score. Left HIPAA compliant message for patient to return my call at his convenience.   Plan - Will f/u in 3-5 business days if I have not heard back from patient  Catie Darnelle Maffucci, PharmD PGY2 [place] Resident, [place] Phone: [phone]

## 2017-12-19 DIAGNOSIS — Z79899 Other long term (current) drug therapy: Secondary | ICD-10-CM | POA: Diagnosis not present

## 2017-12-19 DIAGNOSIS — M109 Gout, unspecified: Secondary | ICD-10-CM | POA: Diagnosis not present

## 2017-12-19 DIAGNOSIS — G8929 Other chronic pain: Secondary | ICD-10-CM | POA: Diagnosis not present

## 2017-12-19 DIAGNOSIS — M5441 Lumbago with sciatica, right side: Secondary | ICD-10-CM | POA: Diagnosis not present

## 2017-12-19 DIAGNOSIS — M5442 Lumbago with sciatica, left side: Secondary | ICD-10-CM | POA: Diagnosis not present

## 2017-12-19 DIAGNOSIS — R7303 Prediabetes: Secondary | ICD-10-CM | POA: Diagnosis not present

## 2017-12-19 DIAGNOSIS — E7800 Pure hypercholesterolemia, unspecified: Secondary | ICD-10-CM | POA: Diagnosis not present

## 2017-12-31 DIAGNOSIS — R7303 Prediabetes: Secondary | ICD-10-CM | POA: Diagnosis not present

## 2017-12-31 DIAGNOSIS — E559 Vitamin D deficiency, unspecified: Secondary | ICD-10-CM | POA: Diagnosis not present

## 2017-12-31 DIAGNOSIS — E7800 Pure hypercholesterolemia, unspecified: Secondary | ICD-10-CM | POA: Diagnosis not present

## 2017-12-31 DIAGNOSIS — M109 Gout, unspecified: Secondary | ICD-10-CM | POA: Diagnosis not present

## 2018-01-15 DIAGNOSIS — L280 Lichen simplex chronicus: Secondary | ICD-10-CM | POA: Diagnosis not present

## 2018-01-15 DIAGNOSIS — R600 Localized edema: Secondary | ICD-10-CM | POA: Diagnosis not present

## 2018-01-15 DIAGNOSIS — I872 Venous insufficiency (chronic) (peripheral): Secondary | ICD-10-CM | POA: Diagnosis not present

## 2018-01-17 DIAGNOSIS — G8929 Other chronic pain: Secondary | ICD-10-CM | POA: Diagnosis not present

## 2018-01-17 DIAGNOSIS — E7800 Pure hypercholesterolemia, unspecified: Secondary | ICD-10-CM | POA: Diagnosis not present

## 2018-01-17 DIAGNOSIS — M5441 Lumbago with sciatica, right side: Secondary | ICD-10-CM | POA: Diagnosis not present

## 2018-01-17 DIAGNOSIS — M5442 Lumbago with sciatica, left side: Secondary | ICD-10-CM | POA: Diagnosis not present

## 2018-01-17 DIAGNOSIS — Z79899 Other long term (current) drug therapy: Secondary | ICD-10-CM | POA: Diagnosis not present

## 2018-01-22 ENCOUNTER — Other Ambulatory Visit: Payer: Self-pay | Admitting: Pharmacist

## 2018-01-22 DIAGNOSIS — G20 Parkinson's disease: Secondary | ICD-10-CM | POA: Diagnosis not present

## 2018-01-22 NOTE — Patient Outreach (Signed)
[facility]) Care Management  01/22/2018  Tyler Lester 06-Jul-1945 [id number]   72 year old male referred to [facility] for medication review due to High Risk review of [employer] beneficiaries. PMHx includes, but not limited to, T2DM, HTN, PAD, CHF, GERD, Parkinson Disease  Will close case d/t inability to engage.   Catie Darnelle Maffucci, PharmD PGY2 [facility] Resident, [facility] Phone: [phone]

## 2018-02-05 DIAGNOSIS — B351 Tinea unguium: Secondary | ICD-10-CM | POA: Diagnosis not present

## 2018-02-05 DIAGNOSIS — M79674 Pain in right toe(s): Secondary | ICD-10-CM | POA: Diagnosis not present

## 2018-02-05 DIAGNOSIS — M79675 Pain in left toe(s): Secondary | ICD-10-CM | POA: Diagnosis not present

## 2018-02-05 DIAGNOSIS — R7303 Prediabetes: Secondary | ICD-10-CM | POA: Diagnosis not present

## 2018-02-13 DIAGNOSIS — M5442 Lumbago with sciatica, left side: Secondary | ICD-10-CM | POA: Diagnosis not present

## 2018-02-13 DIAGNOSIS — G8929 Other chronic pain: Secondary | ICD-10-CM | POA: Diagnosis not present

## 2018-02-13 DIAGNOSIS — K219 Gastro-esophageal reflux disease without esophagitis: Secondary | ICD-10-CM | POA: Diagnosis not present

## 2018-02-13 DIAGNOSIS — M5441 Lumbago with sciatica, right side: Secondary | ICD-10-CM | POA: Diagnosis not present

## 2018-02-13 DIAGNOSIS — Z79899 Other long term (current) drug therapy: Secondary | ICD-10-CM | POA: Diagnosis not present

## 2018-02-14 ENCOUNTER — Other Ambulatory Visit: Payer: Self-pay | Admitting: Internal Medicine

## 2018-02-14 DIAGNOSIS — B351 Tinea unguium: Secondary | ICD-10-CM

## 2018-02-18 NOTE — Telephone Encounter (Signed)
I have not yet had the pleasure to meet Tyler Lester. The medication requested as a refill (terbenafine) is not a long term medication and should not be restarted without seeing me first. I will not refill. Please offer him an appt to meet new PCP.

## 2018-02-20 DIAGNOSIS — I83029 Varicose veins of left lower extremity with ulcer of unspecified site: Secondary | ICD-10-CM | POA: Diagnosis not present

## 2018-02-20 DIAGNOSIS — I872 Venous insufficiency (chronic) (peripheral): Secondary | ICD-10-CM | POA: Diagnosis not present

## 2018-02-20 DIAGNOSIS — M5442 Lumbago with sciatica, left side: Secondary | ICD-10-CM | POA: Diagnosis not present

## 2018-02-20 DIAGNOSIS — L97929 Non-pressure chronic ulcer of unspecified part of left lower leg with unspecified severity: Secondary | ICD-10-CM | POA: Diagnosis not present

## 2018-02-20 NOTE — Telephone Encounter (Signed)
LVM to call office back to inform him of below and to see about scheduling him an appointment. Katharina Caper, Teasia Zapf D, [name]

## 2018-02-21 ENCOUNTER — Telehealth: Payer: Self-pay | Admitting: Family Medicine

## 2018-02-21 NOTE — Telephone Encounter (Signed)
Pt says he missed a call from our office and was not sure who called. I did not see any encounters. If someone called he would like for them to call him back.

## 2018-02-24 NOTE — Telephone Encounter (Signed)
Returned call.  Pt informed and appt made for 03/20/17. [place], Salome Spotted, CMA

## 2018-02-27 DIAGNOSIS — R600 Localized edema: Secondary | ICD-10-CM | POA: Diagnosis not present

## 2018-02-27 DIAGNOSIS — I872 Venous insufficiency (chronic) (peripheral): Secondary | ICD-10-CM | POA: Diagnosis not present

## 2018-02-27 DIAGNOSIS — L280 Lichen simplex chronicus: Secondary | ICD-10-CM | POA: Diagnosis not present

## 2018-03-05 ENCOUNTER — Ambulatory Visit (INDEPENDENT_AMBULATORY_CARE_PROVIDER_SITE_OTHER): Payer: Medicare Other | Admitting: Family Medicine

## 2018-03-05 ENCOUNTER — Other Ambulatory Visit: Payer: Self-pay

## 2018-03-05 VITALS — BP 100/60 | HR 88 | Temp 98.3°F | Wt 191.0 lb

## 2018-03-05 DIAGNOSIS — L989 Disorder of the skin and subcutaneous tissue, unspecified: Secondary | ICD-10-CM | POA: Diagnosis not present

## 2018-03-05 DIAGNOSIS — M7989 Other specified soft tissue disorders: Secondary | ICD-10-CM

## 2018-03-05 DIAGNOSIS — R600 Localized edema: Secondary | ICD-10-CM | POA: Diagnosis not present

## 2018-03-05 MED ORDER — BACITRACIN-NEOMYCIN-POLYMYXIN 400-5-5000 EX OINT: 1.0000 "application " | TOPICAL_OINTMENT | Freq: Two times a day (BID) | CUTANEOUS | 0 refills | Status: DC

## 2018-03-05 NOTE — Assessment & Plan Note (Signed)
Unsure what the cause is of his acute on chronic leg swelling, although a heart failure component likely plays a role.  Weight today at 191 pounds is not significantly elevated from his dry weight.  Will obtain a BNP, BMP, and will order an echo today.  Advised patient to take torsemide 60 mg for the next 3 days rather than his usual 20 mg and to continue Lasix 40 mg twice daily.  Also advised patient to elevate his leg to the height of his heart and to use less sodium in his diet, provided patient with 2 handouts with recommendations for a lower sodium diet.

## 2018-03-05 NOTE — Patient Instructions (Addendum)
It was nice meeting you today Tyler Lester!  For your leg swelling, please take 3 of your torsemide pills each day for the next 3 days.  Also, elevate your leg so that they are about at the level of your heart and reduce your salt intake.  You can continue taking your furosemide 40 mg twice a day during this time.  We are getting a couple labs today to check your heart and kidney function as well as to check the levels of electrolytes in your blood.  I will let you know if any of these results are abnormal.  We also have scheduled an echo cardiogram for you next week.  For your leg wound, I am sending an urgent referral for dermatology, and I would like for you to watch this wound closely to make sure that it does not get more painful, more red, or start to have drainage.  I think that it is safe to continue watching it and to not dress it, although I will send in some triple antibiotic ointment for you to use.  If you have any questions or concerns, please feel free to call the clinic.   Be well,  Dr. Shan Levans  Cooking With Less Salt Cooking with less salt is one way to reduce the amount of sodium you get from food. Depending on your condition and overall health, your health care provider or diet and nutrition specialist (dietitian) may recommend that you reduce your sodium intake. Most people should have less than 2,300 milligrams (mg) of sodium each day. If you have high blood pressure (hypertension), you may need to limit your sodium to 1,500 mg each day. Follow the tips below to help reduce your sodium intake. What do I need to know about cooking with less salt? Shopping  Buy sodium-free or low-sodium products. Look for the following words on food labels: ? Low-sodium. ? Sodium-free. ? Reduced-sodium. ? No salt added. ? Unsalted.  Buy fresh or frozen vegetables. Avoid canned vegetables.  Avoid buying meats or protein foods that have been injected with broth or saline solution.  Avoid  cured or smoked meats, such as hot dogs, bacon, salami, ham, and bologna. Reading food labels   Check the food label before buying or using packaged ingredients.  Look for products with no more than 140 mg of sodium in one serving.  Do not choose foods with salt as one of the first three ingredients on the ingredients list. If salt is one of the first three ingredients, it usually means the item is high in sodium, because ingredients are listed in order of amount in the food item. Cooking  Use herbs, seasonings without salt, and spices as substitutes for salt in foods.  Use sodium-free baking soda when baking.  Grill, braise, or roast foods to add flavor with less salt.  Avoid adding salt to pasta, rice, or hot cereals while cooking.  Drain and rinse canned vegetables before use.  Avoid adding salt when cooking sweets and desserts.  Cook with low-sodium ingredients. What are some salt alternatives? The following are herbs, seasonings, and spices that can be used instead of salt to give taste to your food. Herbs should be fresh or dried. Do not choose packaged mixes. Next to the name of the herb, spice, or seasoning are some examples of foods you can pair it with. Herbs  Bay leaves - Soups, meat and vegetable dishes, and spaghetti sauce.  Basil - [employer], soups, pasta, and fish dishes.  Cilantro - Meat, poultry, and vegetable dishes.  Chili powder - Marinades and Mexican dishes.  Chives - Salad dressings and potato dishes.  Cumin - Mexican dishes, couscous, and meat dishes.  Dill - Fish dishes, sauces, and salads.  Fennel - Meat and vegetable dishes, breads, and cookies.  Garlic (do not use garlic salt) - [place] dishes, meat dishes, salad dressings, and sauces.  Marjoram - Soups, potato dishes, and meat dishes.  Oregano - Pizza and spaghetti sauce.  Parsley - Salads, soups, pasta, and meat dishes.  Rosemary - [place] dishes, salad dressings, soups, and red  meats.  Saffron - Fish dishes, pasta, and some poultry dishes.  Sage - Stuffings and sauces.  Tarragon - Fish and [employer].  Thyme - Stuffing, meat, and fish dishes. Seasonings  Lemon juice - Fish dishes, poultry dishes, vegetables, and salads.  Vinegar - Salad dressings, vegetables, and fish dishes. Spices  Cinnamon - Sweet dishes, such as cakes, cookies, and puddings.  Cloves - Gingerbread, puddings, and marinades for meats.  Curry - Vegetable dishes, fish and poultry dishes, and stir-fry dishes.  Ginger - Vegetables dishes, fish dishes, and stir-fry dishes.  Nutmeg - Pasta, vegetables, poultry, fish dishes, and custard. What are some low-sodium ingredients and foods?  Fresh or frozen fruits and vegetables with no sauce added.  Fresh or frozen whole meats, poultry, and fish with no sauce added.  Eggs.  Noodles, pasta, quinoa, rice.  Shredded or puffed wheat or puffed rice.  Regular or quick oats.  Milk, yogurt, hard cheeses, and low-sodium cheeses. Good cheese choices include Swiss, [place]. Always check the label for the serving size and sodium content.  Unsalted butter or margarine.  Unsalted nuts.  Sherbet or ice cream (keep to  cup per serving).  Homemade pudding.  Sodium-free baking soda and baking powder. This is not a complete list of low-sodium ingredients and foods. Contact your dietitian for more options. Summary  Cooking with less salt is one way to reduce the amount of sodium that you get from food.  Buy sodium-free or low-sodium products.  Check the food label before using or buying packaged ingredients.  Use herbs, seasonings without salt, and spices as substitutes for salt in foods. This information is not intended to replace advice given to you by your health care provider. Make sure you discuss any questions you have with your health care provider. Document Released: 02/12/2005 Document Revised: 02/21/2016  Document Reviewed: 02/21/2016 Elsevier Interactive Patient Education  2019 [employer] DASH stands for "Dietary Approaches to Stop Hypertension." The DASH eating plan is a healthy eating plan that has been shown to reduce high blood pressure (hypertension). It may also reduce your risk for type 2 diabetes, heart disease, and stroke. The DASH eating plan may also help with weight loss. What are tips for following this plan?  General guidelines  Avoid eating more than 2,300 mg (milligrams) of salt (sodium) a day. If you have hypertension, you may need to reduce your sodium intake to 1,500 mg a day.  Limit alcohol intake to no more than 1 drink a day for nonpregnant women and 2 drinks a day for men. One drink equals 12 oz of beer, 5 oz of wine, or 1 oz of hard liquor.  Work with your health care provider to maintain a healthy body weight or to lose weight. Ask what an ideal weight is for you.  Get at least 30 minutes of exercise that causes  your heart to beat faster (aerobic exercise) most days of the week. Activities may include walking, swimming, or biking.  Work with your health care provider or diet and nutrition specialist (dietitian) to adjust your eating plan to your individual calorie needs. Reading food labels   Check food labels for the amount of sodium per serving. Choose foods with less than 5 percent of the Daily Value of sodium. Generally, foods with less than 300 mg of sodium per serving fit into this eating plan.  To find whole grains, look for the word "whole" as the first word in the ingredient list. Shopping  Buy products labeled as "low-sodium" or "no salt added."  Buy fresh foods. Avoid canned foods and premade or frozen meals. Cooking  Avoid adding salt when cooking. Use salt-free seasonings or herbs instead of table salt or sea salt. Check with your health care provider or pharmacist before using salt substitutes.  Do not fry foods. Cook  foods using healthy methods such as baking, boiling, grilling, and broiling instead.  Cook with heart-healthy oils, such as olive, canola, soybean, or sunflower oil. Meal planning  Eat a balanced diet that includes: ? 5 or more servings of fruits and vegetables each day. At each meal, try to fill half of your plate with fruits and vegetables. ? Up to 6-8 servings of whole grains each day. ? Less than 6 oz of lean meat, poultry, or fish each day. A 3-oz serving of meat is about the same size as a deck of cards. One egg equals 1 oz. ? 2 servings of low-fat dairy each day. ? A serving of nuts, seeds, or beans 5 times each week. ? Heart-healthy fats. Healthy fats called Omega-3 fatty acids are found in foods such as flaxseeds and coldwater fish, like sardines, salmon, and mackerel.  Limit how much you eat of the following: ? Canned or prepackaged foods. ? Food that is high in trans fat, such as fried foods. ? Food that is high in saturated fat, such as fatty meat. ? Sweets, desserts, sugary drinks, and other foods with added sugar. ? Full-fat dairy products.  Do not salt foods before eating.  Try to eat at least 2 vegetarian meals each week.  Eat more home-cooked food and less restaurant, buffet, and fast food.  When eating at a restaurant, ask that your food be prepared with less salt or no salt, if possible. What foods are recommended? The items listed may not be a complete list. Talk with your dietitian about what dietary choices are best for you. Grains Whole-grain or whole-wheat bread. Whole-grain or whole-wheat pasta. Brown rice. [place]. Bulgur. Whole-grain and low-sodium cereals. Pita bread. Low-fat, low-sodium crackers. Whole-wheat flour tortillas. Vegetables Fresh or frozen vegetables (raw, steamed, roasted, or grilled). Low-sodium or reduced-sodium tomato and vegetable juice. Low-sodium or reduced-sodium tomato sauce and tomato paste. Low-sodium or reduced-sodium canned  vegetables. Fruits All fresh, dried, or frozen fruit. Canned fruit in natural juice (without added sugar). Meat and other protein foods Skinless chicken or [place]. Ground chicken or [place]. Pork with fat trimmed off. Fish and seafood. Egg whites. Dried beans, peas, or lentils. Unsalted nuts, nut butters, and seeds. Unsalted canned beans. Lean cuts of beef with fat trimmed off. Low-sodium, lean deli meat. Dairy Low-fat (1%) or fat-free (skim) milk. Fat-free, low-fat, or reduced-fat cheeses. Nonfat, low-sodium ricotta or cottage cheese. Low-fat or nonfat yogurt. Low-fat, low-sodium cheese. Fats and oils Soft margarine without trans fats. Vegetable oil. Low-fat, reduced-fat, or light mayonnaise  and salad dressings (reduced-sodium). Canola, safflower, olive, soybean, and sunflower oils. Avocado. Seasoning and other foods Herbs. Spices. Seasoning mixes without salt. Unsalted popcorn and pretzels. Fat-free sweets. What foods are not recommended? The items listed may not be a complete list. Talk with your dietitian about what dietary choices are best for you. Grains Baked goods made with fat, such as croissants, muffins, or some breads. Dry pasta or rice meal packs. Vegetables Creamed or fried vegetables. Vegetables in a cheese sauce. Regular canned vegetables (not low-sodium or reduced-sodium). Regular canned tomato sauce and paste (not low-sodium or reduced-sodium). Regular tomato and vegetable juice (not low-sodium or reduced-sodium). Angie Fava. Olives. Fruits Canned fruit in a light or heavy syrup. Fried fruit. Fruit in cream or butter sauce. Meat and other protein foods Fatty cuts of meat. Ribs. Fried meat. Berniece Salines. Sausage. Bologna and other processed lunch meats. Salami. Fatback. Hotdogs. Bratwurst. Salted nuts and seeds. Canned beans with added salt. Canned or smoked fish. Whole eggs or egg yolks. Chicken or [place] with skin. Dairy Whole or 2% milk, cream, and half-and-half. Whole or full-fat  cream cheese. Whole-fat or sweetened yogurt. Full-fat cheese. Nondairy creamers. Whipped toppings. Processed cheese and cheese spreads. Fats and oils Butter. Stick margarine. Lard. Shortening. Ghee. Bacon fat. Tropical oils, such as coconut, palm kernel, or palm oil. Seasoning and other foods Salted popcorn and pretzels. Onion salt, garlic salt, seasoned salt, table salt, and sea salt. Worcestershire sauce. Tartar sauce. Barbecue sauce. Teriyaki sauce. Soy sauce, including reduced-sodium. Steak sauce. Canned and packaged gravies. Fish sauce. Oyster sauce. Cocktail sauce. Horseradish that you find on the shelf. Ketchup. Mustard. Meat flavorings and tenderizers. Bouillon cubes. Hot sauce and Tabasco sauce. Premade or packaged marinades. Premade or packaged taco seasonings. Relishes. Regular salad dressings. Where to find more information:  National Heart, Lung, and [place]: https://wilson-eaton.com/  American Heart Association: www.heart.org Summary  The DASH eating plan is a healthy eating plan that has been shown to reduce high blood pressure (hypertension). It may also reduce your risk for type 2 diabetes, heart disease, and stroke.  With the DASH eating plan, you should limit salt (sodium) intake to 2,300 mg a day. If you have hypertension, you may need to reduce your sodium intake to 1,500 mg a day.  When on the DASH eating plan, aim to eat more fresh fruits and vegetables, whole grains, lean proteins, low-fat dairy, and heart-healthy fats.  Work with your health care provider or diet and nutrition specialist (dietitian) to adjust your eating plan to your individual calorie needs. This information is not intended to replace advice given to you by your health care provider. Make sure you discuss any questions you have with your health care provider. Document Released: 02/01/2011 Document Revised: 02/06/2016 Document Reviewed: 02/06/2016 Elsevier Interactive Patient Education  2019 [employer].

## 2018-03-05 NOTE — Assessment & Plan Note (Signed)
Place an urgent referral to dermatology since previous referral has not been effective.  Also gave patient triple antibiotic ointment that he can apply to his skin lesion.  Do not think the lesion needs further therapy since it is slowly improving, but gave patient and his caretaker return precautions, including increased swelling, redness, or pustular drainage.

## 2018-03-05 NOTE — Progress Notes (Signed)
Subjective:    Tyler Lester - 73 y.o. male MRN [id number]  Date of birth: [birth year]  CC:  Tyler Lester is here for left lower extremity edema and an open wound on his L leg.  HPI: Lower extremity edema -Left lower extremity greater than right -Duration: acute on chronic, worsening for about one month, along with the wound -Medications for edema: torsemide 20 mg, furosemide 40 mg BID -Associated symptoms: orthopnea, paroxysmal nocturnal dyspnea, has needed to sit upright during the night for many years -Has not been watching his salt and his caretaker reports that he rarely elevates his legs high enough  Chronic leg wound - insidious onset, likely due to blister that popped - appears less erythematous than it did - is painful, which has not improved or worsened -No constitutional symptoms -No drainage -Has been referred to dermatology in the past, but he says that he has never heard from them  Health Maintenance:  Health Maintenance Due  Topic Date Due  . COLONOSCOPY  07/31/1997  . FOOT EXAM  03/27/2017  . OPHTHALMOLOGY EXAM  05/24/2017  . HEMOGLOBIN A1C  06/27/2017  . INFLUENZA VACCINE  09/26/2017    -  reports that he has never smoked. He has never used smokeless tobacco. - Review of Systems: Per HPI. - Past Medical History: Patient Active Problem List   Diagnosis Date Noted  . Onychomycosis 08/05/2017  . Care plan discussed with patient 06/27/2017  . Chronic bilateral low back pain with bilateral sciatica 01/30/2017  . Memory change 11/09/2016  . Carpal tunnel syndrome of left wrist 11/06/2016  . Cubital tunnel syndrome on left 11/06/2016  . Hyperlipidemia 11/05/2016  . Enrolled in chronic care management 10/02/2016  . PAD (peripheral artery disease) ([place]) 07/28/2016  . Leg swelling 07/27/2016  . Routine adult health maintenance 06/18/2016  . Diabetic polyneuropathy associated with type 2 diabetes mellitus ([place]) 04/13/2016  . BPH with Refracotry Urinary  Retention 03/07/2016  . Diminished hearing, left 01/20/2016  . Type 2 diabetes mellitus with complication ([place]) 96/[id number]/9381  . Chronic back pain 09/16/2015  . Parkinson disease ([place]) 08/26/2015  . Rhinitis, allergic 08/25/2015  . Gait abnormality 03/28/2015  . Left arm swelling 11/11/2014  . Postlaminectomy syndrome, cervical region 07/23/2014  . Parkinson's disease ([place]) 07/23/2014  . Back pain with sciatica 05/27/2014  . Bilateral leg pain 05/14/2014  . Depression 10/15/2013  . CKD Stage 2 08/25/2013  . Encounter for chronic pain management 07/13/2013  . Tremor 05/23/2013  . Chronic diastolic heart failure ([place]) 05/06/2013  . Knee pain, bilateral 12/12/2012  . Overgrown toenails 05/14/2012  . GERD (gastroesophageal reflux disease) 07/04/2011  . Skin lesion of lower extremity 01/26/2011  . Essential hypertension 01/05/2011   - Medications: reviewed and updated   Objective:   Physical Exam BP 100/60   Pulse 88   Temp 98.3 F (36.8 C) (Oral)   Wt 191 lb (86.6 kg)   SpO2 96%   BMI 28.21 kg/m  Gen: NAD, alert, cooperative with exam, older lady, flat affect, pill-rolling tremor noted CV: RRR, good S1/S2, no murmur, 3+ pitting edema up to bilateral knees, right greater than left Resp: CTABL, no wheezes, non-labored, no crackles Skin: Scattered blisters along both lower extremities unchanged from picture in media tab, with 1 area on the anterior left leg that has been unroofed, without erythema or presence of pus, surrounding area mildly tender to palpation     Assessment & Plan:   Leg swelling Unsure what the cause  is of his acute on chronic leg swelling, although a heart failure component likely plays a role.  Weight today at 191 pounds is not significantly elevated from his dry weight.  Will obtain a BNP, BMP, and will order an echo today.  Advised patient to take torsemide 60 mg for the next 3 days rather than his usual 20 mg and to continue Lasix 40 mg twice daily.  Also  advised patient to elevate his leg to the height of his heart and to use less sodium in his diet, provided patient with 2 handouts with recommendations for a lower sodium diet.  Skin lesion of lower extremity Place an urgent referral to dermatology since previous referral has not been effective.  Also gave patient triple antibiotic ointment that he can apply to his skin lesion.  Do not think the lesion needs further therapy since it is slowly improving, but gave patient and his caretaker return precautions, including increased swelling, redness, or pustular drainage.    Maia Breslow, M.D. 03/05/2018, 11:50 AM PGY-2, [place]

## 2018-03-06 LAB — BASIC METABOLIC PANEL
BUN/Creatinine Ratio: 9 — ABNORMAL LOW (ref 10–24)
BUN: 14 mg/dL (ref 8–27)
CO2: 26 mmol/L (ref 20–29)
Calcium: 9.8 mg/dL (ref 8.6–10.2)
Chloride: 100 mmol/L (ref 96–106)
Creatinine, Ser: 1.55 mg/dL — ABNORMAL HIGH (ref 0.76–1.27)
GFR calc Af Amer: 52 mL/min/{1.73_m2} — ABNORMAL LOW (ref >59)
GFR calc non Af Amer: 45 mL/min/{1.73_m2} — ABNORMAL LOW (ref >59)
Glucose: 116 mg/dL — ABNORMAL HIGH (ref 65–99)
Potassium: 4 mmol/L (ref 3.5–5.2)
Sodium: 141 mmol/L (ref 134–144)

## 2018-03-06 LAB — BRAIN NATRIURETIC PEPTIDE: BNP: 16.9 pg/mL (ref 0.0–100.0)

## 2018-03-14 ENCOUNTER — Ambulatory Visit (HOSPITAL_COMMUNITY)
Admission: RE | Admit: 2018-03-14 | Discharge: 2018-03-14 | Disposition: A | Discharge status: Home / Self Care | Payer: Medicare Other | Source: Ambulatory Visit | Attending: Family Medicine | Admitting: Family Medicine

## 2018-03-14 DIAGNOSIS — E785 Hyperlipidemia, unspecified: Secondary | ICD-10-CM | POA: Diagnosis not present

## 2018-03-14 DIAGNOSIS — I509 Heart failure, unspecified: Secondary | ICD-10-CM | POA: Insufficient documentation

## 2018-03-14 DIAGNOSIS — M7989 Other specified soft tissue disorders: Secondary | ICD-10-CM | POA: Insufficient documentation

## 2018-03-14 DIAGNOSIS — K219 Gastro-esophageal reflux disease without esophagitis: Secondary | ICD-10-CM | POA: Diagnosis not present

## 2018-03-14 DIAGNOSIS — I110 Hypertensive heart disease with heart failure: Secondary | ICD-10-CM | POA: Diagnosis not present

## 2018-03-14 DIAGNOSIS — E119 Type 2 diabetes mellitus without complications: Secondary | ICD-10-CM | POA: Diagnosis not present

## 2018-03-14 NOTE — Progress Notes (Signed)
  Echocardiogram 2D Echocardiogram has been performed.  [place]ia Sargeant L Androw 03/14/2018, 1:48 PM

## 2018-03-17 DIAGNOSIS — G8929 Other chronic pain: Secondary | ICD-10-CM | POA: Diagnosis not present

## 2018-03-17 DIAGNOSIS — M5442 Lumbago with sciatica, left side: Secondary | ICD-10-CM | POA: Diagnosis not present

## 2018-03-17 DIAGNOSIS — M5441 Lumbago with sciatica, right side: Secondary | ICD-10-CM | POA: Diagnosis not present

## 2018-03-17 DIAGNOSIS — Z79899 Other long term (current) drug therapy: Secondary | ICD-10-CM | POA: Diagnosis not present

## 2018-03-17 DIAGNOSIS — E7800 Pure hypercholesterolemia, unspecified: Secondary | ICD-10-CM | POA: Diagnosis not present

## 2018-03-20 ENCOUNTER — Ambulatory Visit: Payer: Medicare Other | Admitting: Family Medicine

## 2018-03-20 DIAGNOSIS — I890 Lymphedema, not elsewhere classified: Secondary | ICD-10-CM | POA: Diagnosis not present

## 2018-03-20 DIAGNOSIS — L280 Lichen simplex chronicus: Secondary | ICD-10-CM | POA: Diagnosis not present

## 2018-03-24 DIAGNOSIS — G8929 Other chronic pain: Secondary | ICD-10-CM | POA: Diagnosis not present

## 2018-03-24 DIAGNOSIS — M5441 Lumbago with sciatica, right side: Secondary | ICD-10-CM | POA: Diagnosis not present

## 2018-03-24 DIAGNOSIS — M5442 Lumbago with sciatica, left side: Secondary | ICD-10-CM | POA: Diagnosis not present

## 2018-03-24 DIAGNOSIS — R7303 Prediabetes: Secondary | ICD-10-CM | POA: Diagnosis not present

## 2018-03-28 DIAGNOSIS — G20 Parkinson's disease: Secondary | ICD-10-CM | POA: Diagnosis not present

## 2018-04-09 DIAGNOSIS — R600 Localized edema: Secondary | ICD-10-CM | POA: Diagnosis not present

## 2018-04-09 DIAGNOSIS — I890 Lymphedema, not elsewhere classified: Secondary | ICD-10-CM | POA: Diagnosis not present

## 2018-04-09 DIAGNOSIS — I8312 Varicose veins of left lower extremity with inflammation: Secondary | ICD-10-CM | POA: Diagnosis not present

## 2018-04-09 DIAGNOSIS — I8311 Varicose veins of right lower extremity with inflammation: Secondary | ICD-10-CM | POA: Diagnosis not present

## 2018-04-09 DIAGNOSIS — L97211 Non-pressure chronic ulcer of right calf limited to breakdown of skin: Secondary | ICD-10-CM | POA: Diagnosis not present

## 2018-04-14 DIAGNOSIS — L97211 Non-pressure chronic ulcer of right calf limited to breakdown of skin: Secondary | ICD-10-CM | POA: Diagnosis not present

## 2018-04-14 DIAGNOSIS — I8311 Varicose veins of right lower extremity with inflammation: Secondary | ICD-10-CM | POA: Diagnosis not present

## 2018-04-14 DIAGNOSIS — L97221 Non-pressure chronic ulcer of left calf limited to breakdown of skin: Secondary | ICD-10-CM | POA: Diagnosis not present

## 2018-04-21 DIAGNOSIS — L97211 Non-pressure chronic ulcer of right calf limited to breakdown of skin: Secondary | ICD-10-CM | POA: Diagnosis not present

## 2018-04-21 DIAGNOSIS — I890 Lymphedema, not elsewhere classified: Secondary | ICD-10-CM | POA: Diagnosis not present

## 2018-04-21 DIAGNOSIS — I8311 Varicose veins of right lower extremity with inflammation: Secondary | ICD-10-CM | POA: Diagnosis not present

## 2018-04-21 DIAGNOSIS — I8312 Varicose veins of left lower extremity with inflammation: Secondary | ICD-10-CM | POA: Diagnosis not present

## 2018-04-21 DIAGNOSIS — I83893 Varicose veins of bilateral lower extremities with other complications: Secondary | ICD-10-CM | POA: Diagnosis not present

## 2018-04-22 ENCOUNTER — Ambulatory Visit (INDEPENDENT_AMBULATORY_CARE_PROVIDER_SITE_OTHER): Payer: Medicare Other | Admitting: Family Medicine

## 2018-04-22 ENCOUNTER — Encounter: Payer: Self-pay | Admitting: Family Medicine

## 2018-04-22 ENCOUNTER — Other Ambulatory Visit: Payer: Self-pay

## 2018-04-22 DIAGNOSIS — I10 Essential (primary) hypertension: Secondary | ICD-10-CM | POA: Diagnosis not present

## 2018-04-22 DIAGNOSIS — I5032 Chronic diastolic (congestive) heart failure: Secondary | ICD-10-CM

## 2018-04-22 DIAGNOSIS — R413 Other amnesia: Secondary | ICD-10-CM | POA: Diagnosis not present

## 2018-04-22 DIAGNOSIS — G20 Parkinson's disease: Secondary | ICD-10-CM

## 2018-04-22 DIAGNOSIS — G20A1 Parkinson's disease without dyskinesia, without mention of fluctuations: Secondary | ICD-10-CM

## 2018-04-22 NOTE — Progress Notes (Signed)
   CC: several  HPI  Parkinsons - he feels like the med from neurology isn't helping, he notes it is not helping because he is having more tremor. Has been taking 2 tablets in the morning and two in the afternoon.   Low energy - been a problem for some time. Feels tired all the time and like his movements are slow. No particular time of the day this is worse.   Needs an rx for pads xl 180 in a box. This is for bladder incontinene, this has been present for several years. Saw urology in the past. Needs diapers as well.   Has a caregiver through personal home care. Has family, but none in town. Has a pill box, but not using it.   HTN - took lisinopril today.   Not taking any diuretics right now.   ROS: Denies CP, SOB, abdominal pain, dysuria, changes in BMs.   CC, SH/smoking status, and VS noted  Objective: BP (!) 145/75   Pulse 70   Temp 97.9 F (36.6 C) (Oral)   Wt 192 lb (87.1 kg)   SpO2 98%   BMI 28.35 kg/m  Gen: NAD, alert, cooperative, and pleasant. HEENT: NCAT, EOMI, PERRL CV: RRR, no murmur Resp: CTAB, no wheezes, non-labored Abd: SNTND, BS present, no guarding or organomegaly Ext: No edema, warm.  Pill-rolling tremor of the left hand at rest.  Lower extremities wrapped in [place] boots. Neuro: Alert and oriented, Speech clear, No gross deficits  Assessment and plan:  We did a med review today and eliminated several old medications from his list.  Of note, he is not using a pillbox which would be particularly beneficial to him with his possible Parkinson's dementia.  Care coordination: This patient would be a great candidate for the pace program if he would qualify.  Particularly, he has no local family that is helping care for him and he seems to have some confusion about his medication regimen.  I offered a referral to pace, however he feels that "he is not there yet,"and he will let us know if this changes.  Chronic diastolic heart failure Appears euvolemic on exam  today, not taking any diuretics.  Not having any DOE.  Continue current plan for now.  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  Continue lisinopril.  Had recent labs showing stable CKD.  Parkinson's disease ([place]) Follows with Dr. Lavell Anchors of neurology, unfortunately he was not taking his Sinemet correctly.  We reviewed the dose that they had recommended at his last visit, and he had been missing the noon dose.  He will take this.  We reviewed that he should take this away from protein having meals.  I asked him to call the neurologist and check on when his next follow-up appointment should be.  I cannot find this information and there patient instructions from their last visit.  I suspect the low energy that he is noticing may be related to underdosing his Sinemet.  If this persists once he is taking the appropriate dose we could work-up thyroid etiology or other concerns.  Memory change There is a question from the neurologist about parkinsonian dementia, see my care coordination note above.  He is on Aricept per neurology.  I do think he would benefit from additional coordination of care as I mentioned.   Ralene Ok, MD, PGY3 04/24/2018 8:04 AM

## 2018-04-22 NOTE — Patient Instructions (Signed)
It was a pleasure to see you today! Thank you for choosing [facility] for your primary care. Tyler Lester was seen for blood pressure.   Our plans for today were:  Please take your parkinsons medicine at 8am, noon, and 4pm. Call the neurologist and see when they wanted to see you back.   Think about the PACE program that I told you about for extra help.   Best,  Dr. Lindell Noe

## 2018-04-24 DIAGNOSIS — M5442 Lumbago with sciatica, left side: Secondary | ICD-10-CM | POA: Diagnosis not present

## 2018-04-24 DIAGNOSIS — M5441 Lumbago with sciatica, right side: Secondary | ICD-10-CM | POA: Diagnosis not present

## 2018-04-24 DIAGNOSIS — Z79899 Other long term (current) drug therapy: Secondary | ICD-10-CM | POA: Diagnosis not present

## 2018-04-24 DIAGNOSIS — E559 Vitamin D deficiency, unspecified: Secondary | ICD-10-CM | POA: Diagnosis not present

## 2018-04-24 DIAGNOSIS — G8929 Other chronic pain: Secondary | ICD-10-CM | POA: Diagnosis not present

## 2018-04-24 NOTE — Progress Notes (Deleted)
PATIENT: Tyler Lester DOB: Jul 26, 1945  REASON FOR VISIT: follow up HISTORY FROM: patient  No chief complaint on file.    HISTORY OF PRESENT ILLNESS: Today 04/24/18 Tyler Lester is a 73 y.o. male here today for follow up.     HISTORY: (copied from Dr Cathren Laine note on 11/27/2017) Interval history 11/27/2017: Patient is here for Parkinson's Disease. Unfortunately in the past he has been non compliant with medication and management plan and also refused help such as [facility] to help with his medication management. He did not come back because I discussed I couldn't help him. Today he returns and wonderfully he had a nurse/caregiver with him who is there at his home and helps with medication management.  No falls in the home. Using a walker. No swallowing difficulties. No hallucinations or elusions. No drooling. No dizziness, not lightheaded, good appetite. He has a tremor, Sinemet helps when he takes. He denies rem sleep disorder. Sleeping well. During the day awake and alert.   HPI: Tyler Lester is a 73 y.o. male here as a follow up for Parkinsonism, memory loss, gait ataxia, non-compliance. Memory problems started years ago. The tremor is more at rest. And the jaw trembles as well.  Feels like his voice is getting softer. He has vivid dreams. He has drool on his pillow. He has fallen a few times and he catches himslef. He has loss of smell and taste. He can't smell dinner. He is swallowing OK. His handwriting has gotten smaller, when he writes memos he has noticed he has gotten smaller.    Interval history 02/15/2015: He does not know what medications he is taking. He has refused [facility] or home health care to help him with medications. He is a poor historian. I have asked him multiple times to bring in his medication bottles and he always forgets. i had a frank conversation with patient, I cannot manage his parkinsonism in this manner. He is supposed to be on Sinemet and Azilect.  Called the pharmacy and he has not filled [place] since July. Sinemet was last filled on 12/5 but before then he filled it in September for a month. Not compliant.   Interval history 11/17/2014: He is having neck pain. He was scheduled today to discuss memory loss and he was supposed to bring a family member but he forgot. He would like pain medication and gets Vicadin but Dr. Lonny Prude won't give hm anymore because he had alcohol in his urine. He is not wearing his neupro patch today. I am not sure he is taking his medications. He declines any services to help him in the house. He has not started the Lyrica. He never got the MRI of the brain completed that I ordered last December. He says the neupro patch doesn't work and gives him a lot of urges to have sex.   Interval History 03/22/2014: the tremor continues. He appears to be a poor historian and doesn't quite know what medications he is taking. He is not having any side effects from the Azilect and says he believes he is taking it. He also has neck pain and low back pain. He is on pain medication and is waiting to be seen by pain management. He had recent cervical spine surgery and has had lumber spine surgery in the past. He is having shooting pain from low back into right leg that is chronic and he is being managed by orthopaedic doctor. The orthopedist cancelled his pain medication  because he was already getting vicodin from dr. Teryl Lucy and patient wants to know if I can give him the percocet instead of vicodin. He is only taking 200mg  three times daily (at first he said just once daily) of neurontin. No side effects from the gabapentin.    02/03/2014: Tyler Lester is a 73 y.o. male here as a referral from Dr. Lonny Prude for Tremor. PMHx cervical fusion (September), chronic back pain, DM, HTN, fatigue, depression, anxiety, myelopathy.   Was sent here by Dr. Teryl Lucy for evaluation of tremor. He has a tremor in his left hand and his jaw. Tremor has  only been going on for at least a year but worse in the last 3 months. Getting worse after neck surgery. Worse in the left but also in the right hand as well as the chin. He can't walk, he has chronic pain in his low back and neck. Walking slow, doesn't have energy. No tremors in other family members. Doesn't notice the tremor getting better with alcohol. No constipation. Can hardly smell. Sometimes he is talking low, softer. Hand writing is shaky, smaller. Feels weak. TSH was normal in June. Does not drink caffeine. 1-2 drinks a week, no significant alcohol. No FHx of parkinson's or neurodegenerative disease. Has had falls.    REVIEW OF SYSTEMS: Out of a complete 14 system review of symptoms, the patient complains only of the following symptoms, and all other reviewed systems are negative.  ALLERGIES: Allergies  Allergen Reactions  . Poison Ivy Treatments Hives    Pt denies allergy to this medication    HOME MEDICATIONS: Outpatient Medications Prior to Visit  Medication Sig Dispense Refill  . aspirin EC 81 MG tablet Take 1 tablet (81 mg total) by mouth daily. 30 tablet 11  . atorvastatin (LIPITOR) 20 MG tablet TAKE ONE TABLET BY MOUTH EVERY DAY 30 tablet 11  . carbidopa-levodopa (SINEMET) 25-100 MG tablet Take 2 tablets by mouth 3 (three) times daily. Take 4 hours apart at 8am, noon and 4pm for example. Avoid high protein diet. 540 tablet 3  . Cholecalciferol (VITAMIN D) 2000 units CAPS Take 1 capsule (2,000 Units total) by mouth daily. 90 capsule 11  . donepezil (ARICEPT) 10 MG tablet Take 1 tablet (10 mg total) by mouth daily. 30 tablet 6  . fluticasone (FLONASE) 50 MCG/ACT nasal spray Use 2 sprays in each nostril every day 16 g 11  . lisinopril (PRINIVIL,ZESTRIL) 20 MG tablet TAKE ONE TABLET BY MOUTH EVERY DAY 30 tablet 11  . Multiple Vitamins-Minerals (MULTIVITAMIN MEN 50+) TABS Take 1 tablet by mouth daily. 90 tablet 11  . naloxone (NARCAN) 2 MG/2ML injection Inject 0.4 mLs (0.4 mg  total) into the muscle as needed (opiod overdose. May repeat in two to three minutes.). 2 mL 1  . neomycin-bacitracin-polymyxin (NEOSPORIN) ointment Apply 1 application topically every 12 (twelve) hours. 15 g 0  . NONFORMULARY OR COMPOUNDED ITEM [facility]:  Peripheral Neuropathy cream - Bupivacaine 1%, Doxepin 3%, Gabapentin 6%, Pentoxifylline 3%, Topiramate 1%, apply 1-2 grams to affected area 3-4 times daily. 120 each 2  . oxyCODONE-acetaminophen (PERCOCET) 7.5-325 MG tablet TK 1 T PO Q 6 H PRN FOR PAIN  0  . senna-docusate (SENOKOT-S) 8.6-50 MG tablet Take 1 tablet by mouth 2 (two) times daily. While taking strong pain meds to prevent constipation. (Patient not taking: Reported on 11/27/2017) 30 tablet 0  . SSD 1 % cream APPLY TOPICALLY DAILY 50 g 0   Facility-Administered Medications Prior to Visit  Medication Dose Route Frequency Provider Last Rate Last Dose  . magnesium citrate solution 1 Bottle  1 Bottle Oral Once Alexis Frock, MD        PAST MEDICAL HISTORY: Past Medical History:  Diagnosis Date  . Bilateral foot pain   . Chronic pain   . Diabetes mellitus    type 2, diet controlled now  . Foley catheter in place since nov 2018, last changed 1 week ago  . Hyperlipidemia   . Hypertension   . Inguinal hernia    right  . Numbness    T/O  . Parkinson's disease ([place])   . Pneumonia yrs ago  . Shortness of breath    with exertion  . Urinary retention     PAST SURGICAL HISTORY: Past Surgical History:  Procedure Laterality Date  . CATARACT EXTRACTION W/ INTRAOCULAR LENS IMPLANT Left 06/2012   laser for posterior capsule?  . COLON SURGERY    . POSTERIOR CERVICAL FUSION/FORAMINOTOMY N/A 11/12/2013   Procedure: Posterior cervical decompression fusion, cervical 3-4, cervical 4-5, cervical 5-6, cervical 6-7 with instrumentation and allograft   (LEVEL 4);  Surgeon: Sinclair Ship, MD;  Location: ;  Service: Orthopedics;  Laterality: N/A;  Posterior cervical  decompression fusion, cervical 3-4, cervical 4-5, cervical 5-6, cervical 6-7 with instrumentation and allograft  . TONSILLECTOMY    . XI ROBOTIC ASSISTED SIMPLE PROSTATECTOMY N/A 03/07/2016   Procedure: XI ROBOTIC ASSISTED SIMPLE PROSTATECTOMY AND UMBILICAL HERNIA REPAIR flexible cystoscopy;  Surgeon: Alexis Frock, MD;  Location: [facility];  Service: Urology;  Laterality: N/A;    FAMILY HISTORY: Family History  Problem Relation Age of Onset  . Cancer - Other Other   . Diabetes Other   . Hypertension Other     SOCIAL HISTORY: Social History   Socioeconomic History  . Marital status: Single    Spouse name: Not on file  . Number of children: 2  . Years of education: 36  . Highest education level: Not on file  Occupational History  . Occupation: Retired     Comment: Event organiser at [employer]  . Financial resource strain: Not on file  . Food insecurity:    Worry: Not on file    Inability: Not on file  . Transportation needs:    Medical: Not on file    Non-medical: Not on file  Tobacco Use  . Smoking status: Never Smoker  . Smokeless tobacco: Never Used  Substance and Sexual Activity  . Alcohol use: Yes    Alcohol/week: 0.0 standard drinks    Comment: 1 shot a day  . Drug use: No  . Sexual activity: Not on file  Lifestyle  . Physical activity:    Days per week: Not on file    Minutes per session: Not on file  . Stress: Not on file  Relationships  . Social connections:    Talks on phone: Not on file    Gets together: Not on file    Attends religious service: Not on file    Active member of club or organization: Not on file    Attends meetings of clubs or organizations: Not on file    Relationship status: Not on file  . Intimate partner violence:    Fear of current or ex partner: Not on file    Emotionally abused: Not on file    Physically abused: Not on file    Forced sexual activity: Not on file  Other Topics Concern  . Not  on file  Social History  Narrative   Patient lives at home alone.    Patient have 2 children.    Patient has a high school education    Patient is right handed.    Patient is retired on disability.       PHYSICAL EXAM  There were no vitals filed for this visit. There is no height or weight on file to calculate BMI.  Generalized: Well developed, in no acute distress  Cardiology: normal rate and rhythm, no murmur noted Neurological examination  Mentation: Alert oriented to time, place, history taking. Follows all commands speech and language fluent Cranial nerve II-XII: Pupils were equal round reactive to light. Extraocular movements were full, visual field were full on confrontational test. Facial sensation and strength were normal. Uvula tongue midline. Head turning and shoulder shrug  were normal and symmetric. Motor: The motor testing reveals 5 over 5 strength of all 4 extremities. Good symmetric motor tone is noted throughout.  Sensory: Sensory testing is intact to soft touch on all 4 extremities. No evidence of extinction is noted.  Coordination: Cerebellar testing reveals good finger-nose-finger and heel-to-shin bilaterally.  Gait and station: Gait is normal. Tandem gait is normal. Romberg is negative. No drift is seen.  Reflexes: Deep tendon reflexes are symmetric and normal bilaterally.   DIAGNOSTIC DATA (LABS, IMAGING, TESTING) - I reviewed patient records, labs, notes, testing and imaging myself where available.  No flowsheet data found.   Lab Results  Component Value Date   WBC 6.2 07/26/2016   HGB 15.1 07/26/2016   HCT 44.7 07/26/2016   MCV 86 07/26/2016   PLT 195 07/26/2016      Component Value Date/Time   NA 141 03/05/2018 1143   K 4.0 03/05/2018 1143   CL 100 03/05/2018 1143   CO2 26 03/05/2018 1143   GLUCOSE 116 (H) 03/05/2018 1143   GLUCOSE 172 (H) 03/08/2016 0545   BUN 14 03/05/2018 1143   CREATININE 1.55 (H) 03/05/2018 1143   CREATININE 1.53 (H) 10/20/2015 1213   CALCIUM  9.8 03/05/2018 1143   PROT 6.6 08/05/2017 1541   ALBUMIN 3.7 08/05/2017 1541   AST 21 08/05/2017 1541   ALT 5 08/05/2017 1541   ALKPHOS 49 08/05/2017 1541   BILITOT 0.4 08/05/2017 1541   GFRNONAA 45 (L) 03/05/2018 1143   GFRNONAA 46 (L) 10/20/2015 1213   GFRAA 52 (L) 03/05/2018 1143   GFRAA 53 (L) 10/20/2015 1213   Lab Results  Component Value Date   CHOL 159 10/20/2015   HDL 32 (L) 10/20/2015   LDLCALC 66 10/20/2015   LDLDIRECT 85 07/04/2011   TRIG 307 (H) 10/20/2015   CHOLHDL 5.0 10/20/2015   Lab Results  Component Value Date   HGBA1C 5.8 12/28/2016   Lab Results  Component Value Date   NWGNFAOZ30 865 11/27/2017   Lab Results  Component Value Date   TSH 1.990 07/26/2016       ASSESSMENT AND PLAN 73 y.o. year old male  has a past medical history of Bilateral foot pain, Chronic pain, Diabetes mellitus, Foley catheter in place (since nov 2018, last changed 1 week ago), Hyperlipidemia, Hypertension, Inguinal hernia, Numbness, Parkinson's disease ([place]), Pneumonia (yrs ago), Shortness of breath, and Urinary retention. here with ***    ICD-10-CM   1. Parkinson's disease ([place]) G20        No orders of the defined types were placed in this encounter.    No orders of the defined types were placed in  this encounter.     I spent 15 minutes with the patient. 50% of this time was spent counseling and educating patient on plan of care and medications.    Debbora Presto, FNP-C 04/24/2018, 4:22 PM [facility] [address], [place], [place] [address] [phone]

## 2018-04-24 NOTE — Assessment & Plan Note (Signed)
There is a question from the neurologist about parkinsonian dementia, see my care coordination note above.  He is on Aricept per neurology.  I do think he would benefit from additional coordination of care as I mentioned.

## 2018-04-24 NOTE — Assessment & Plan Note (Addendum)
Follows with Dr. Lavell Anchors of neurology, unfortunately he was not taking his Sinemet correctly.  We reviewed the dose that they had recommended at his last visit, and he had been missing the noon dose.  He will take this.  We reviewed that he should take this away from protein having meals.  I asked him to call the neurologist and check on when his next follow-up appointment should be.  I cannot find this information and there patient instructions from their last visit.  I suspect the low energy that he is noticing may be related to underdosing his Sinemet.  If this persists once he is taking the appropriate dose we could work-up thyroid etiology or other concerns.

## 2018-04-24 NOTE — Assessment & Plan Note (Signed)
Appears euvolemic on exam today, not taking any diuretics.  Not having any DOE.  Continue current plan for now.

## 2018-04-24 NOTE — Assessment & Plan Note (Signed)
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  Continue lisinopril.  Had recent labs showing stable CKD.

## 2018-04-28 ENCOUNTER — Ambulatory Visit: Payer: Medicare Other | Admitting: Family Medicine

## 2018-04-28 ENCOUNTER — Ambulatory Visit: Payer: Medicare Other | Admitting: Nurse Practitioner

## 2018-04-28 ENCOUNTER — Telehealth: Payer: Self-pay

## 2018-04-28 NOTE — Telephone Encounter (Signed)
Patient was a no call/no show for their appointment today.   

## 2018-04-29 ENCOUNTER — Encounter: Payer: Self-pay | Admitting: Family Medicine

## 2018-04-30 DIAGNOSIS — I87319 Chronic venous hypertension (idiopathic) with ulcer of unspecified lower extremity: Secondary | ICD-10-CM | POA: Diagnosis not present

## 2018-05-02 ENCOUNTER — Telehealth: Payer: Self-pay

## 2018-05-02 DIAGNOSIS — R32 Unspecified urinary incontinence: Secondary | ICD-10-CM

## 2018-05-02 NOTE — Telephone Encounter (Signed)
Pt called nurse line requesting DME orders for adult diapers to be sent to [facility]. Pt stated this was dicussed at his recent OV. Please advise.

## 2018-05-05 NOTE — Telephone Encounter (Signed)
I sent a message to our [facility] contacts to see if they can order this, I can't find an epic order.

## 2018-05-06 NOTE — Telephone Encounter (Signed)
DME order for disposable underwear and 180/bx pads sent and messaged [facility].

## 2018-05-06 NOTE — Addendum Note (Signed)
Addended by: Glenis Smoker on: 05/06/2018 01:29 PM   Modules accepted: Orders

## 2018-05-07 ENCOUNTER — Telehealth: Payer: Self-pay

## 2018-05-07 ENCOUNTER — Ambulatory Visit: Payer: Medicare Other | Admitting: Family Medicine

## 2018-05-07 NOTE — Telephone Encounter (Signed)
Patient was a no call/no show for their appointment today.   

## 2018-05-08 ENCOUNTER — Encounter: Payer: Self-pay | Admitting: Family Medicine

## 2018-05-12 ENCOUNTER — Telehealth: Payer: Self-pay | Admitting: Family Medicine

## 2018-05-12 NOTE — Telephone Encounter (Signed)
I have attempted to reach patient at telephone number provided in chart regarding upcoming appt. Will consider rescheduling if patient wishes to reduce potential exposure to coronavirus. . No answer and voicemail does not identify owner. Will try again.

## 2018-05-12 NOTE — Progress Notes (Signed)
PATIENT: Tyler Lester DOB: 04-06-1945  REASON FOR VISIT: follow up HISTORY FROM: patient  Chief Complaint  Patient presents with  . Follow-up    5 month follow up. Alone. Rm 4. Patient would to be placed in a exercise class to help with his parkinson's. He also mentioned that his tremor to his left side has gotten worse.     HISTORY OF PRESENT ILLNESS: Today 05/14/18 Tyler Lester is a 73 y.o. male here today for follow up Parkinson's Disease.  He reports doing fairly well with the exception of increased tremor in the left hand.  He feels that the tremor is worse in the mornings.  This tremor does improve after taking Sinemet.  He is also noticed some change in his gait.  He feels that he is not able to lift his right leg normally.  He denies any falls.  He does use a cane or walker with long distance walking.  He denies any dizziness or lightheadedness.  No swallowing difficulties no drooling.  He feels that his memory is stable.  He is taking Aricept 10 mg daily as prescribed.  Sinemet 25/100 mg 2 capsules 3 times a day.  He reports dosing is typically at 8 AM, 12 PM and 6 PM.    HISTORY: (copied from Dr Cathren Laine note on 11/27/2017) Interval history 11/27/2017: Patient is here for Parkinson's Disease. Unfortunately in the past he has been non compliant with medication and management plan and also refused help such as [facility] to help with his medication management. He did not come back because I discussed I couldn't help him. Today he returns and wonderfully he had a nurse/caregiver with him who is there at his home and helps with medication management.  No falls in the home. Using a walker. No swallowing difficulties. No hallucinations or elusions. No drooling. No dizziness, not lightheaded, good appetite. He has a tremor, Sinemet helps when he takes. He denies rem sleep disorder. Sleeping well. During the day awake and alert.  HPI: Tyler Lester is a 73 y.o. male here as a follow  up for Parkinsonism, memory loss, gait ataxia, non-compliance. Memory problems started years ago. The tremor is more at rest. And the jaw trembles as well.  Feels like his voice is getting softer. He has vivid dreams. He has drool on his pillow. He has fallen a few times and he catches himslef. He has loss of smell and taste. He can't smell dinner. He is swallowing OK. His handwriting has gotten smaller, when he writes memos he has noticed he has gotten smaller.   Interval history 02/15/2015: He does not know what medications he is taking. He has refused [facility] or home health care to help him with medications. He is a poor historian. I have asked him multiple times to bring in his medication bottles and he always forgets. i had a frank conversation with patient, I cannot manage his parkinsonism in this manner. He is supposed to be on Sinemet and Azilect. Called the pharmacy and he has not filled Coram since July. Sinemet was last filled on 12/5 but before then he filled it in September for a month. Not compliant.  Interval history 11/17/2014: He is having neck pain. He was scheduled today to discuss memory loss and he was supposed to bring a family member but he forgot. He would like pain medication and gets Vicadin but Dr. Lonny Prude won't give hm anymore because he had alcohol in his urine. He  is not wearing his neupro patch today. I am not sure he is taking his medications. He declines any services to help him in the house. He has not started the Lyrica. He never got the MRI of the brain completed that I ordered last December. He says the neupro patch doesn't work and gives him a lot of urges to have sex.  Interval History 03/22/2014: the tremor continues. He appears to be a poor historian and doesn't quite know what medications he is taking. He is not having any side effects from the Azilect and says he believes he is taking it. He also has neck pain and low back pain. He is on pain medication and is  waiting to be seen by pain management. He had recent cervical spine surgery and has had lumber spine surgery in the past. He is having shooting pain from low back into right leg that is chronic and he is being managed by orthopaedic doctor. The orthopedist cancelled his pain medication because he was already getting vicodin from dr. Teryl Lucy and patient wants to know if I can give him the percocet instead of vicodin. He is only taking 200mg  three times daily (at first he said just once daily) of neurontin. No side effects from the gabapentin.   02/03/2014: Tyler Lester is a 73 y.o. male here as a referral from Dr. Lonny Prude for Tremor. PMHx cervical fusion (September), chronic back pain, DM, HTN, fatigue, depression, anxiety, myelopathy.   Was sent here by Dr. Teryl Lucy for evaluation of tremor. He has a tremor in his left hand and his jaw. Tremor has only been going on for at least a year but worse in the last 3 months. Getting worse after neck surgery. Worse in the left but also in the right hand as well as the chin. He can't walk, he has chronic pain in his low back and neck. Walking slow, doesn't have energy. No tremors in other family members. Doesn't notice the tremor getting better with alcohol. No constipation. Can hardly smell. Sometimes he is talking low, softer. Hand writing is shaky, smaller. Feels weak. TSH was normal in June. Does not drink caffeine. 1-2 drinks a week, no significant alcohol. No FHx of parkinson's or neurodegenerative disease. Has had falls.   REVIEW OF SYSTEMS: Out of a complete 14 system review of symptoms, the patient complains only of the following symptoms, tremor, gait difficulty and all other reviewed systems are negative.  ALLERGIES: Allergies  Allergen Reactions  . Poison Ivy Treatments Hives    Pt denies allergy to this medication    HOME MEDICATIONS: Outpatient Medications Prior to Visit  Medication Sig Dispense Refill  . aspirin EC 81 MG tablet Take 1 tablet  (81 mg total) by mouth daily. 30 tablet 11  . atorvastatin (LIPITOR) 20 MG tablet TAKE ONE TABLET BY MOUTH EVERY DAY 30 tablet 11  . Cholecalciferol (VITAMIN D) 2000 units CAPS Take 1 capsule (2,000 Units total) by mouth daily. 90 capsule 11  . donepezil (ARICEPT) 10 MG tablet Take 1 tablet (10 mg total) by mouth daily. 30 tablet 6  . fluticasone (FLONASE) 50 MCG/ACT nasal spray Use 2 sprays in each nostril every day 16 g 11  . lisinopril (PRINIVIL,ZESTRIL) 20 MG tablet TAKE ONE TABLET BY MOUTH EVERY DAY 30 tablet 11  . Multiple Vitamins-Minerals (MULTIVITAMIN MEN 50+) TABS Take 1 tablet by mouth daily. 90 tablet 11  . naloxone (NARCAN) 2 MG/2ML injection Inject 0.4 mLs (0.4 mg total) into the  muscle as needed (opiod overdose. May repeat in two to three minutes.). 2 mL 1  . neomycin-bacitracin-polymyxin (NEOSPORIN) ointment Apply 1 application topically every 12 (twelve) hours. 15 g 0  . NONFORMULARY OR COMPOUNDED ITEM [facility]:  Peripheral Neuropathy cream - Bupivacaine 1%, Doxepin 3%, Gabapentin 6%, Pentoxifylline 3%, Topiramate 1%, apply 1-2 grams to affected area 3-4 times daily. 120 each 2  . oxyCODONE-acetaminophen (PERCOCET) 7.5-325 MG tablet TK 1 T PO Q 6 H PRN FOR PAIN  0  . senna-docusate (SENOKOT-S) 8.6-50 MG tablet Take 1 tablet by mouth 2 (two) times daily. While taking strong pain meds to prevent constipation. 30 tablet 0  . SSD 1 % cream APPLY TOPICALLY DAILY 50 g 0  . carbidopa-levodopa (SINEMET) 25-100 MG tablet Take 2 tablets by mouth 3 (three) times daily. Take 4 hours apart at 8am, noon and 4pm for example. Avoid high protein diet. 540 tablet 3   Facility-Administered Medications Prior to Visit  Medication Dose Route Frequency Provider Last Rate Last Dose  . magnesium citrate solution 1 Bottle  1 Bottle Oral Once Alexis Frock, MD        PAST MEDICAL HISTORY: Past Medical History:  Diagnosis Date  . Bilateral foot pain   . Chronic pain   . Diabetes mellitus     type 2, diet controlled now  . Foley catheter in place since nov 2018, last changed 1 week ago  . Hyperlipidemia   . Hypertension   . Inguinal hernia    right  . Numbness    T/O  . Parkinson's disease ([place])   . Pneumonia yrs ago  . Shortness of breath    with exertion  . Urinary retention     PAST SURGICAL HISTORY: Past Surgical History:  Procedure Laterality Date  . CATARACT EXTRACTION W/ INTRAOCULAR LENS IMPLANT Left 06/2012   laser for posterior capsule?  . COLON SURGERY    . POSTERIOR CERVICAL FUSION/FORAMINOTOMY N/A 11/12/2013   Procedure: Posterior cervical decompression fusion, cervical 3-4, cervical 4-5, cervical 5-6, cervical 6-7 with instrumentation and allograft   (LEVEL 4);  Surgeon: Sinclair Ship, MD;  Location: [facility];  Service: Orthopedics;  Laterality: N/A;  Posterior cervical decompression fusion, cervical 3-4, cervical 4-5, cervical 5-6, cervical 6-7 with instrumentation and allograft  . TONSILLECTOMY    . XI ROBOTIC ASSISTED SIMPLE PROSTATECTOMY N/A 03/07/2016   Procedure: XI ROBOTIC ASSISTED SIMPLE PROSTATECTOMY AND UMBILICAL HERNIA REPAIR flexible cystoscopy;  Surgeon: Alexis Frock, MD;  Location: [facility];  Service: Urology;  Laterality: N/A;    FAMILY HISTORY: Family History  Problem Relation Age of Onset  . Cancer - Other Other   . Diabetes Other   . Hypertension Other     SOCIAL HISTORY: Social History   Socioeconomic History  . Marital status: Single    Spouse name: Not on file  . Number of children: 2  . Years of education: 78  . Highest education level: Not on file  Occupational History  . Occupation: Retired     Comment: Event organiser at [employer]  . Financial resource strain: Not on file  . Food insecurity:    Worry: Not on file    Inability: Not on file  . Transportation needs:    Medical: Not on file    Non-medical: Not on file  Tobacco Use  . Smoking status: Never Smoker  . Smokeless tobacco: Never Used   Substance and Sexual Activity  . Alcohol use: Yes  Alcohol/week: 0.0 standard drinks    Comment: 1 shot a day  . Drug use: No  . Sexual activity: Not on file  Lifestyle  . Physical activity:    Days per week: Not on file    Minutes per session: Not on file  . Stress: Not on file  Relationships  . Social connections:    Talks on phone: Not on file    Gets together: Not on file    Attends religious service: Not on file    Active member of club or organization: Not on file    Attends meetings of clubs or organizations: Not on file    Relationship status: Not on file  . Intimate partner violence:    Fear of current or ex partner: Not on file    Emotionally abused: Not on file    Physically abused: Not on file    Forced sexual activity: Not on file  Other Topics Concern  . Not on file  Social History Narrative   Patient lives at home alone.    Patient have 2 children.    Patient has a high school education    Patient is right handed.    Patient is retired on disability.       PHYSICAL EXAM  Vitals:   05/14/18 1045  BP: (!) 141/83  Pulse: 83  Weight: 195 lb 3.2 oz (88.5 kg)  Height: 5\' 9"  (1.753 m)   Body mass index is 28.83 kg/m.  Generalized: Well developed, in no acute distress  Cardiology: normal rate and rhythm, no murmur noted Neurological examination  Mentation: Alert oriented to time, place, history taking. Follows all commands speech and language fluent Cranial nerve II-XII: Pupils were equal round reactive to light. Extraocular movements were full, visual field were full on confrontational test. Facial sensation and strength were normal. Uvula tongue midline. Head turning and shoulder shrug  were normal and symmetric. Motor: The motor testing reveals 4 over 5 strength of all 4 extremities. Good symmetric motor tone is noted throughout. Bradykinesia with finger and toe taps Sensory: Sensory testing is intact to soft touch on all 4 extremities. No evidence  of extinction is noted.  Coordination: Cerebellar testing reveals good finger-nose-finger and heel-to-shin bilaterally.  Gait and station: Gait is short but stable, decreased arm swing   DIAGNOSTIC DATA (LABS, IMAGING, TESTING) - I reviewed patient records, labs, notes, testing and imaging myself where available.  MMSE - Mini Mental State Exam 05/14/2018  Not completed: (No Data)  Orientation to time 4  Orientation to Place 5  Registration 3  Attention/ Calculation 0  Recall 2  Language- name 2 objects 2  Language- repeat 1  Language- follow 3 step command 3  Language- read & follow direction 1  Write a sentence 0  Copy design 1  Total score 22     Lab Results  Component Value Date   WBC 6.2 07/26/2016   HGB 15.1 07/26/2016   HCT 44.7 07/26/2016   MCV 86 07/26/2016   PLT 195 07/26/2016      Component Value Date/Time   NA 141 03/05/2018 1143   K 4.0 03/05/2018 1143   CL 100 03/05/2018 1143   CO2 26 03/05/2018 1143   GLUCOSE 116 (H) 03/05/2018 1143   GLUCOSE 172 (H) 03/08/2016 0545   BUN 14 03/05/2018 1143   CREATININE 1.55 (H) 03/05/2018 1143   CREATININE 1.53 (H) 10/20/2015 1213   CALCIUM 9.8 03/05/2018 1143   PROT 6.6 08/05/2017 1541  ALBUMIN 3.7 08/05/2017 1541   AST 21 08/05/2017 1541   ALT 5 08/05/2017 1541   ALKPHOS 49 08/05/2017 1541   BILITOT 0.4 08/05/2017 1541   GFRNONAA 45 (L) 03/05/2018 1143   GFRNONAA 46 (L) 10/20/2015 1213   GFRAA 52 (L) 03/05/2018 1143   GFRAA 53 (L) 10/20/2015 1213   Lab Results  Component Value Date   CHOL 159 10/20/2015   HDL 32 (L) 10/20/2015   LDLCALC 66 10/20/2015   LDLDIRECT 85 07/04/2011   TRIG 307 (H) 10/20/2015   CHOLHDL 5.0 10/20/2015   Lab Results  Component Value Date   HGBA1C 5.8 12/28/2016   Lab Results  Component Value Date   VITAMINB12 835 11/27/2017   Lab Results  Component Value Date   TSH 1.990 07/26/2016     ASSESSMENT AND PLAN 73 y.o. year old male  has a past medical history of  Bilateral foot pain, Chronic pain, Diabetes mellitus, Foley catheter in place (since nov 2018, last changed 1 week ago), Hyperlipidemia, Hypertension, Inguinal hernia, Numbness, Parkinson's disease ([place]), Pneumonia (yrs ago), Shortness of breath, and Urinary retention. here with     ICD-10-CM   1. Parkinson's disease ([place]) [facility] referral to Physical Therapy  2. Parkinson disease ([facility]) G20 carbidopa-levodopa (SINEMET) 25-100 MG tablet    Mr. Creason is doing well overall.  He has noted increased tremor and some trouble with his right leg.  We will increase Sinemet to 2 tablets 4 times daily.  I have educated him on appropriate dosing.  We will try 8 AM, 11 AM, 2 PM and 5 PM dosing.  He is aware of these instructions and they are included in his prescription today.  We will also refer him to physical therapy to evaluate gait.  He was advised to continue using his walker for long distances.  Fall precautions.  I have educated him on proper hand hygiene and advised that he limit his exposure to crowds.  We will have him return in 6 months for follow-up with Dr. Jaynee Eagles.    Orders Placed This Encounter  Procedures  . Ambulatory referral to Physical Therapy    Referral Priority:   Routine    Referral Type:   Physical Medicine    Referral Reason:   Specialty Services Required    Requested Specialty:   Physical Therapy    Number of Visits Requested:   1     Meds ordered this encounter  Medications  . carbidopa-levodopa (SINEMET) 25-100 MG tablet    Sig: Take 2 tablets by mouth 4 (four) times daily. Take 3 hours apart at 8am, 11am, 2pm and 5pm for example. Avoid high protein diet.    Dispense:  720 tablet    Refill:  3    Order Specific Question:   Supervising Provider    Answer:   Melvenia Beam [id number]      I spent 15 minutes with the patient. 50% of this time was spent counseling and educating patient on plan of care and medications.    Debbora Presto, FNP-C 05/14/2018, 1:31 PM  [facility] [address], [place], [place] [address] [phone]

## 2018-05-14 ENCOUNTER — Encounter: Payer: Self-pay | Admitting: Family Medicine

## 2018-05-14 ENCOUNTER — Ambulatory Visit (INDEPENDENT_AMBULATORY_CARE_PROVIDER_SITE_OTHER): Payer: Medicare Other | Admitting: Family Medicine

## 2018-05-14 ENCOUNTER — Other Ambulatory Visit: Payer: Self-pay

## 2018-05-14 VITALS — BP 141/83 | HR 83 | Ht 69.0 in | Wt 195.2 lb

## 2018-05-14 DIAGNOSIS — G20 Parkinson's disease: Secondary | ICD-10-CM | POA: Diagnosis not present

## 2018-05-14 MED ORDER — CARBIDOPA-LEVODOPA 25-100 MG PO TABS: 2.0000 | ORAL_TABLET | Freq: Four times a day (QID) | ORAL | 3 refills | Status: DC

## 2018-05-14 NOTE — Patient Instructions (Signed)
   Parkinson Disease  Parkinson disease is a long-term (chronic) condition. It gets worse over time (is progressive). Parkinson disease limits your ability:   To control how your body moves.   To move your body normally.  This condition affects each person differently. The condition can range from mild to very bad. This condition tends to progress slowly over many years.  Follow these instructions at home:   Take over-the-counter and prescription medicines only as told by your doctor.   Put grab bars and railings in your home. These help to prevent falls.   Follow instructions from your doctor about what you can or cannot eat or drink.   Go back to your normal activities as told by your doctor. Ask your doctor what activities are safe for you.   Exercise as told by your doctor or physical therapist.   Keep all follow-up visits as told by your doctor. This is important. These include any visits with a speech or occupational therapist.   Think about joining a support group for people who have Parkinson disease.  Contact a doctor if:   Medicines do not help your symptoms.   You feel off-balance.   You fall at home.   You need more help at home.   You have:  ? Trouble swallowing.  ? A very hard time pooping (constipation).  ? A lot of side effects from your medicines.   You see or hear things that are not real (hallucinate).   You feel:  ? Confused.  ? Anxious.  ? Sad (depressed).  Get help right away if:   You were hurt in a fall.   You cannot swallow without choking.   You have chest pain.   You have trouble breathing.   You do not feel safe at home.  This information is not intended to replace advice given to you by your health care provider. Make sure you discuss any questions you have with your health care provider.  Document Released: 05/07/2011 Document Revised: 07/21/2015 Document Reviewed: 12/03/2014  Elsevier Interactive Patient Education  2019 Elsevier Inc.

## 2018-05-14 NOTE — Progress Notes (Signed)
Made any corrections needed, and agree with history, physical, neuro exam,assessment and plan as stated.     Antonia Ahern, MD [facility]  

## 2018-05-23 DIAGNOSIS — M5441 Lumbago with sciatica, right side: Secondary | ICD-10-CM | POA: Diagnosis not present

## 2018-05-23 DIAGNOSIS — M5442 Lumbago with sciatica, left side: Secondary | ICD-10-CM | POA: Diagnosis not present

## 2018-05-23 DIAGNOSIS — G8929 Other chronic pain: Secondary | ICD-10-CM | POA: Diagnosis not present

## 2018-05-23 DIAGNOSIS — R7303 Prediabetes: Secondary | ICD-10-CM | POA: Diagnosis not present

## 2018-05-23 DIAGNOSIS — Z79899 Other long term (current) drug therapy: Secondary | ICD-10-CM | POA: Diagnosis not present

## 2018-05-25 ENCOUNTER — Other Ambulatory Visit: Payer: Self-pay | Admitting: Neurology

## 2018-06-04 DIAGNOSIS — I872 Venous insufficiency (chronic) (peripheral): Secondary | ICD-10-CM | POA: Diagnosis not present

## 2018-06-04 DIAGNOSIS — L280 Lichen simplex chronicus: Secondary | ICD-10-CM | POA: Diagnosis not present

## 2018-06-04 DIAGNOSIS — R600 Localized edema: Secondary | ICD-10-CM | POA: Diagnosis not present

## 2018-06-23 DIAGNOSIS — M5442 Lumbago with sciatica, left side: Secondary | ICD-10-CM | POA: Diagnosis not present

## 2018-06-23 DIAGNOSIS — R7303 Prediabetes: Secondary | ICD-10-CM | POA: Diagnosis not present

## 2018-06-23 DIAGNOSIS — Z79899 Other long term (current) drug therapy: Secondary | ICD-10-CM | POA: Diagnosis not present

## 2018-06-23 DIAGNOSIS — M109 Gout, unspecified: Secondary | ICD-10-CM | POA: Diagnosis not present

## 2018-06-23 DIAGNOSIS — G8929 Other chronic pain: Secondary | ICD-10-CM | POA: Diagnosis not present

## 2018-06-23 DIAGNOSIS — E7800 Pure hypercholesterolemia, unspecified: Secondary | ICD-10-CM | POA: Diagnosis not present

## 2018-06-23 DIAGNOSIS — M5441 Lumbago with sciatica, right side: Secondary | ICD-10-CM | POA: Diagnosis not present

## 2018-07-23 DIAGNOSIS — Z79891 Long term (current) use of opiate analgesic: Secondary | ICD-10-CM | POA: Diagnosis not present

## 2018-07-23 DIAGNOSIS — M5442 Lumbago with sciatica, left side: Secondary | ICD-10-CM | POA: Diagnosis not present

## 2018-07-23 DIAGNOSIS — E7800 Pure hypercholesterolemia, unspecified: Secondary | ICD-10-CM | POA: Diagnosis not present

## 2018-07-23 DIAGNOSIS — M5441 Lumbago with sciatica, right side: Secondary | ICD-10-CM | POA: Diagnosis not present

## 2018-07-23 DIAGNOSIS — G8929 Other chronic pain: Secondary | ICD-10-CM | POA: Diagnosis not present

## 2018-07-30 DIAGNOSIS — I8312 Varicose veins of left lower extremity with inflammation: Secondary | ICD-10-CM | POA: Diagnosis not present

## 2018-07-30 DIAGNOSIS — I890 Lymphedema, not elsewhere classified: Secondary | ICD-10-CM | POA: Diagnosis not present

## 2018-07-30 DIAGNOSIS — I83893 Varicose veins of bilateral lower extremities with other complications: Secondary | ICD-10-CM | POA: Diagnosis not present

## 2018-07-30 DIAGNOSIS — I8311 Varicose veins of right lower extremity with inflammation: Secondary | ICD-10-CM | POA: Diagnosis not present

## 2018-08-06 DIAGNOSIS — R7303 Prediabetes: Secondary | ICD-10-CM | POA: Diagnosis not present

## 2018-08-06 DIAGNOSIS — B351 Tinea unguium: Secondary | ICD-10-CM | POA: Diagnosis not present

## 2018-08-06 DIAGNOSIS — M79675 Pain in left toe(s): Secondary | ICD-10-CM | POA: Diagnosis not present

## 2018-08-06 DIAGNOSIS — M79674 Pain in right toe(s): Secondary | ICD-10-CM | POA: Diagnosis not present

## 2018-08-08 ENCOUNTER — Other Ambulatory Visit: Payer: Self-pay

## 2018-08-08 NOTE — Patient Outreach (Signed)
[facility]) Care Management  08/08/2018  Tyler Lester 07/13/1945 [id number]   Medication Adherence call to Mr. Tyler Lester HIPPA Compliant Voice message left with a call back number. Tyler Lester is showing past due on Atorvastatin 20 mg under [place].   [facility] Management Direct Dial [phone]  Fax [phone] [email or web address]

## 2018-08-22 DIAGNOSIS — M5441 Lumbago with sciatica, right side: Secondary | ICD-10-CM | POA: Diagnosis not present

## 2018-08-22 DIAGNOSIS — M5442 Lumbago with sciatica, left side: Secondary | ICD-10-CM | POA: Diagnosis not present

## 2018-08-22 DIAGNOSIS — G8929 Other chronic pain: Secondary | ICD-10-CM | POA: Diagnosis not present

## 2018-08-22 DIAGNOSIS — E7800 Pure hypercholesterolemia, unspecified: Secondary | ICD-10-CM | POA: Diagnosis not present

## 2018-09-09 ENCOUNTER — Other Ambulatory Visit: Payer: Self-pay

## 2018-09-09 DIAGNOSIS — M5442 Lumbago with sciatica, left side: Secondary | ICD-10-CM | POA: Diagnosis not present

## 2018-09-09 DIAGNOSIS — Z79899 Other long term (current) drug therapy: Secondary | ICD-10-CM | POA: Diagnosis not present

## 2018-09-09 DIAGNOSIS — R7303 Prediabetes: Secondary | ICD-10-CM | POA: Diagnosis not present

## 2018-09-09 DIAGNOSIS — M5441 Lumbago with sciatica, right side: Secondary | ICD-10-CM | POA: Diagnosis not present

## 2018-09-09 DIAGNOSIS — G8929 Other chronic pain: Secondary | ICD-10-CM | POA: Diagnosis not present

## 2018-09-09 NOTE — Patient Outreach (Signed)
[facility]) Care Management  09/09/2018  Tyler Lester [birth year] [id number]   Medication Adherence call to Mr. Tyler Lester HIPPA Compliant Voice message left with a call back number. [name] is showing past due on Atorvastatin 20 mg under [facility] [place].   [facility] Management Direct Dial [phone]  Fax [phone] [email or web address]

## 2018-09-15 ENCOUNTER — Other Ambulatory Visit: Payer: Self-pay

## 2018-09-15 ENCOUNTER — Encounter: Payer: Self-pay | Admitting: Family Medicine

## 2018-09-15 ENCOUNTER — Ambulatory Visit (INDEPENDENT_AMBULATORY_CARE_PROVIDER_SITE_OTHER): Payer: Medicare Other | Admitting: Family Medicine

## 2018-09-15 VITALS — BP 122/68 | HR 88 | Wt 200.2 lb

## 2018-09-15 DIAGNOSIS — K219 Gastro-esophageal reflux disease without esophagitis: Secondary | ICD-10-CM

## 2018-09-15 DIAGNOSIS — M5430 Sciatica, unspecified side: Secondary | ICD-10-CM

## 2018-09-15 DIAGNOSIS — R269 Unspecified abnormalities of gait and mobility: Secondary | ICD-10-CM

## 2018-09-15 DIAGNOSIS — M549 Dorsalgia, unspecified: Secondary | ICD-10-CM | POA: Diagnosis not present

## 2018-09-15 MED ORDER — OMEPRAZOLE 20 MG PO CPDR: 20.0000 mg | DELAYED_RELEASE_CAPSULE | Freq: Every day | ORAL | 3 refills | Status: DC

## 2018-09-15 MED ORDER — GABAPENTIN 100 MG PO CAPS: 200.0000 mg | ORAL_CAPSULE | Freq: Three times a day (TID) | ORAL | 1 refills | Status: DC

## 2018-09-15 NOTE — Patient Instructions (Addendum)
It was nice to meet you today,  I have prescribed you gabapentin for your pain.  Take this medicine 3 times a day as prescribed.  We may go up in dose in the future if it is not helping.  I would also like you to get x-ray of your spine.  You can call [facility] to see when they would like you to come in to get this done.  I have also given a referral for home health physical therapy.  This is somebody that can come to your house and help you ambulate.  Someone will call you to schedule an appointment.  Have a great day,   Clemetine Marker, MD

## 2018-09-15 NOTE — Progress Notes (Signed)
   [facility] Phone: [phone]   cc: Back and leg pain  Subjective:  Back and leg pain: Patient states he has been experiencing pain in the back of his legs running down from his gluteus to his knees.  Patient states the pain does not go much further beyond his knees.  The pain is in both legs, he describes it as sharp and constant.  He has been doing exercises to try to help with the pain.  Sitting down makes it somewhat better.  There is nothing he notices that makes the pain worse.  He states it is hard for him to walk because of the pain sometimes.  This is been going on for 5 or 6 months.  He has hydrocodone for other issues, but states this medicine does not help his leg pain.  He has not felt any numbness in the legs, no incontinence or saddle anesthesia.  He has not fallen at any time.  He does not recall any episodes of heavy lifting or trauma prior to this pain.  Patient states he was on gabapentin previously he believes, although he does not know why he was taken off it.  Acid reflux: Patient states he is running out of his acid reflux medication and would like a prescription.  He states it is well controlled with the medication.  ROS: See HPI for pertinent positives and negatives  Past Medical History  Family history reviewed for today's visit. No changes.  Objective: BP 122/68   Pulse 88   Wt 200 lb 3.2 oz (90.8 kg)   SpO2 99%   BMI 29.56 kg/m  Gen: NAD, alert and oriented, cooperative with exam CV: normal rate, regular rhythm. No murmurs, no rubs.  Resp: LCTAB, no wheezes, crackles. normal work of breathing GI: nontender to palpation, BS present, no guarding or organomegaly Msk: Patient able to ambulate without a cane, although rising from a chair and ambulating is slow and deliberate.  Patient takes short steps.  Straight leg test positive on both sides, but patient does not complain of pain in the other leg during cross leg test.  Strength equal  bilaterally in upper and lower extremities.  Patient's hands are tremulous at rest bilaterally. Neuro: CN II-XII grossly intact. no gross deficits Skin: No rashes, no lesions Psych: Appropriate behavior  Assessment/Plan: Back pain with sciatica Patient describes a sharp pain starting in his gluteus area going down past his knees in both legs for the past 4 to 5 months.  Straight leg test positive although he described the pain is ending at the popliteal area.  Patient's ambulation was slow and deliberate, although the patient has Parkinson's disease and this may be attributed to that.  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  Patient was previously on gabapentin, but does not know why or when he was taken off it.  Notes in epic do not mention why patient was stopped on this medication. - Prescribed gabapentin 200 mg 3 times a day, increasing as necessary after follow-up visit. - Ordered lumbar spine plain films to look for degenerative disc disease or fracture - Ambulatory referral to Home health for physical therapy was ordered - Advised patient to follow-up in 4 weeks.  GERD (gastroesophageal reflux disease) Patient states he is out of his Prilosec.  States his GERD is well controlled on this medication. -Refilled his prescription.    Clemetine Marker, MD PGY-2

## 2018-09-16 NOTE — Assessment & Plan Note (Signed)
Patient states he is out of his Prilosec.  States his GERD is well controlled on this medication. -Refilled his prescription.

## 2018-09-16 NOTE — Assessment & Plan Note (Signed)
Patient describes a sharp pain starting in his gluteus area going down past his knees in both legs for the past 4 to 5 months.  Straight leg test positive although he described the pain is ending at the popliteal area.  Patient's ambulation was slow and deliberate, although the patient has Parkinson's disease and this may be attributed to that.  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  Patient was previously on gabapentin, but does not know why or when he was taken off it.  Notes in epic do not mention why patient was stopped on this medication. - Prescribed gabapentin 200 mg 3 times a day, increasing as necessary after follow-up visit. - Ordered lumbar spine plain films to look for degenerative disc disease or fracture - Ambulatory referral to Home health for physical therapy was ordered - Advised patient to follow-up in 4 weeks.

## 2018-09-19 ENCOUNTER — Other Ambulatory Visit: Payer: Self-pay

## 2018-09-19 DIAGNOSIS — E1149 Type 2 diabetes mellitus with other diabetic neurological complication: Secondary | ICD-10-CM

## 2018-09-19 DIAGNOSIS — I10 Essential (primary) hypertension: Secondary | ICD-10-CM

## 2018-09-19 DIAGNOSIS — R0982 Postnasal drip: Secondary | ICD-10-CM

## 2018-09-19 DIAGNOSIS — I5032 Chronic diastolic (congestive) heart failure: Secondary | ICD-10-CM

## 2018-09-22 MED ORDER — LISINOPRIL 20 MG PO TABS: 20.0000 mg | ORAL_TABLET | Freq: Every day | ORAL | 11 refills | Status: DC

## 2018-09-22 MED ORDER — FLUTICASONE PROPIONATE 50 MCG/ACT NA SUSP: 2.0000 | Freq: Every day | NASAL | 11 refills | Status: DC

## 2018-09-22 MED ORDER — ATORVASTATIN CALCIUM 20 MG PO TABS: 20.0000 mg | ORAL_TABLET | Freq: Every day | ORAL | 11 refills | Status: DC

## 2018-10-15 ENCOUNTER — Other Ambulatory Visit: Payer: Self-pay | Admitting: Family Medicine

## 2018-10-22 ENCOUNTER — Other Ambulatory Visit: Payer: Self-pay

## 2018-10-22 ENCOUNTER — Encounter: Payer: Self-pay | Admitting: Family Medicine

## 2018-10-22 ENCOUNTER — Ambulatory Visit (INDEPENDENT_AMBULATORY_CARE_PROVIDER_SITE_OTHER): Payer: Medicare Other | Admitting: Family Medicine

## 2018-10-22 VITALS — BP 125/66 | HR 85 | Temp 98.4°F | Ht 69.0 in | Wt 203.8 lb

## 2018-10-22 DIAGNOSIS — G20 Parkinson's disease: Secondary | ICD-10-CM

## 2018-10-22 MED ORDER — CARBIDOPA-LEVODOPA 25-100 MG PO TABS: 2.0000 | ORAL_TABLET | Freq: Four times a day (QID) | ORAL | 3 refills | Status: DC

## 2018-10-22 NOTE — Patient Instructions (Addendum)
Increase Simemet as directed (Take 2 tablets by mouth 4 (four) times daily. Take 3 hours apart at 8am, 11am, 2pm and 5pm for example. Avoid high protein diet.)  Continue Aricept as prescribed  Continue safety precautions and fall precaution, continue with alert necklace when alone  Follow up with Dr Jaynee Eagles in 3 months, sooner if needed    Parkinson's Disease Parkinson's disease causes problems with movements. It is a long-term condition. It gets worse over time (is progressive). It affects each person in different ways. It makes it harder for you to:  Control how your body moves.  Move your body normally. The condition can range from mild to very bad (advanced). What are the causes? This condition results from a loss of brain cells called neurons. These brain cells make a chemical called dopamine, which is needed to control body movement. As the condition gets worse, the brain cells make less dopamine. This makes it hard to move or control your movements. The exact cause of this condition is not known. What increases the risk?  Being male.  Being age 30 or older.  Having family members who had Parkinson's disease.  Having had an injury to the brain.  Being very sad (depressed).  Being around things that are harmful or poisonous. What are the signs or symptoms? Symptoms of this condition can vary. The main symptoms have to do with movement. These include:  A tremor or shaking while you are resting that you cannot control.  Stiffness in your neck, arms, and legs.  Slowing of movement. This may include: ? Losing expressions of the face. ? Having trouble making small movements that are needed to button your clothing or brush your teeth.  Walking in a way that is not normal. You may walk with short, shuffling steps.  Loss of balance when standing. You may sway, fall backward, or have trouble making turns. Other symptoms include:  Being very sad, worried, or confused.   Seeing or hearing things that are not real.  Losing thinking abilities (dementia).  Trouble speaking or swallowing.  Having a hard time pooping (constipation).  Needing to pee right away, peeing often, or not being able to control when you pee or poop.  Sleep problems. How is this treated? There is no cure. The goal of treatment is to manage your symptoms. Treatment may include:  Medicines.  Therapy to help with talking or movement.  Surgery to reduce shaking and other movements that you cannot control. Follow these instructions at home: Medicines  Take over-the-counter and prescription medicines only as told by your doctor.  Avoid taking pain or sleeping medicines. Eating and drinking  Follow instructions from your doctor about what you cannot eat or drink.  Do not drink alcohol. Activity  Talk with your doctor about if it is safe for you to drive.  Do exercises as told by your doctor. Lifestyle      Put in grab bars and railings in your home. These help to prevent falls.  Do not use any products that contain nicotine or tobacco, such as cigarettes, e-cigarettes, and chewing tobacco. If you need help quitting, ask your doctor.  Join a support group. General instructions  Talk with your doctor about what you need help with and what your safety needs are.  Keep all follow-up visits as told by your doctor, including any therapy visits to help with talking or moving. This is important. Contact a doctor if:  Medicines do not help your symptoms.  You feel off-balance.  You fall at home.  You need more help at home.  You have trouble swallowing.  You have a very hard time pooping.  You have a lot of side effects from your medicines.  You feel very sad, worried, or confused. Get help right away if:  You were hurt in a fall.  You see or hear things that are not real.  You cannot swallow without choking.  You have chest pain or trouble breathing.   You do not feel safe at home.  You have thoughts about hurting yourself or others. If you ever feel like you may hurt yourself or others, or have thoughts about taking your own life, get help right away. You can go to your nearest emergency department or call:  Your local emergency services (911 in the [place]  A suicide crisis helpline, such as the [place] at (213)536-8705. This is open 24 hours a day. Summary  This condition causes problems with movements.  It is a long-term condition. It gets worse over time.  There is no cure. Treatment focuses on managing your symptoms.  Talk with your doctor about what you need help with and what your safety needs are.  Keep all follow-up visits as told by your doctor. This is important. This information is not intended to replace advice given to you by your health care provider. Make sure you discuss any questions you have with your health care provider. Document Released: 05/07/2011 Document Revised: 05/01/2018 Document Reviewed: 05/01/2018 Elsevier Patient Education  [place] in the Home, Adult Falls can cause injuries. They can happen to people of all ages. There are many things you can do to make your home safe and to help prevent falls. Ask for help when making these changes, if needed. What actions can I take to prevent falls? General Instructions  Use good lighting in all rooms. Replace any light bulbs that burn out.  Turn on the lights when you go into a dark area. Use night-lights.  Keep items that you use often in easy-to-reach places. Lower the shelves around your home if necessary.  Set up your furniture so you have a clear path. Avoid moving your furniture around.  Do not have throw rugs and other things on the floor that can make you trip.  Avoid walking on wet floors.  If any of your floors are uneven, fix them.  Add color or contrast paint or tape to clearly  mark and help you see: ? Any grab bars or handrails. ? First and last steps of stairways. ? Where the edge of each step is.  If you use a stepladder: ? Make sure that it is fully opened. Do not climb a closed stepladder. ? Make sure that both sides of the stepladder are locked into place. ? Ask someone to hold the stepladder for you while you use it.  If there are any pets around you, be aware of where they are. What can I do in the bathroom?      Keep the floor dry. Clean up any water that spills onto the floor as soon as it happens.  Remove soap buildup in the tub or shower regularly.  Use non-skid mats or decals on the floor of the tub or shower.  Attach bath mats securely with double-sided, non-slip rug tape.  If you need to sit down in the shower, use a plastic, non-slip stool.  Install grab  bars by the toilet and in the tub and shower. Do not use towel bars as grab bars. What can I do in the bedroom?  Make sure that you have a light by your bed that is easy to reach.  Do not use any sheets or blankets that are too big for your bed. They should not hang down onto the floor.  Have a firm chair that has side arms. You can use this for support while you get dressed. What can I do in the kitchen?  Clean up any spills right away.  If you need to reach something above you, use a strong step stool that has a grab bar.  Keep electrical cords out of the way.  Do not use floor polish or wax that makes floors slippery. If you must use wax, use non-skid floor wax. What can I do with my stairs?  Do not leave any items on the stairs.  Make sure that you have a light switch at the top of the stairs and the bottom of the stairs. If you do not have them, ask someone to add them for you.  Make sure that there are handrails on both sides of the stairs, and use them. Fix handrails that are broken or loose. Make sure that handrails are as long as the stairways.  Install non-slip  stair treads on all stairs in your home.  Avoid having throw rugs at the top or bottom of the stairs. If you do have throw rugs, attach them to the floor with carpet tape.  Choose a carpet that does not hide the edge of the steps on the stairway.  Check any carpeting to make sure that it is firmly attached to the stairs. Fix any carpet that is loose or worn. What can I do on the outside of my home?  Use bright outdoor lighting.  Regularly fix the edges of walkways and driveways and fix any cracks.  Remove anything that might make you trip as you walk through a door, such as a raised step or threshold.  Trim any bushes or trees on the path to your home.  Regularly check to see if handrails are loose or broken. Make sure that both sides of any steps have handrails.  Install guardrails along the edges of any raised decks and porches.  Clear walking paths of anything that might make someone trip, such as tools or rocks.  Have any leaves, snow, or ice cleared regularly.  Use sand or salt on walking paths during winter.  Clean up any spills in your garage right away. This includes grease or oil spills. What other actions can I take?  Wear shoes that: ? Have a low heel. Do not wear high heels. ? Have rubber bottoms. ? Are comfortable and fit you well. ? Are closed at the toe. Do not wear open-toe sandals.  Use tools that help you move around (mobility aids) if they are needed. These include: ? Canes. ? Walkers. ? Scooters. ? Crutches.  Review your medicines with your doctor. Some medicines can make you feel dizzy. This can increase your chance of falling. Ask your doctor what other things you can do to help prevent falls. Where to find more information  Centers for Disease Control and Prevention, [facility]: https://garcia.biz/  [employer] on Aging: BrainJudge.co.uk Contact a doctor if:  You are afraid of falling at home.  You feel weak, drowsy, or dizzy at  home.  You fall at home. Summary  There are many simple things that you can do to make your home safe and to help prevent falls.  Ways to make your home safe include removing tripping hazards and installing grab bars in the bathroom.  Ask for help when making these changes in your home. This information is not intended to replace advice given to you by your health care provider. Make sure you discuss any questions you have with your health care provider. Document Released: 12/09/2008 Document Revised: 06/05/2018 Document Reviewed: 09/27/2016 Elsevier Patient Education  2020 [employer].

## 2018-10-22 NOTE — Progress Notes (Signed)
Made any corrections needed, and agree with history, physical, neuro exam,assessment and plan as stated.     Oluwatosin Higginson, MD [facility]  

## 2018-10-22 NOTE — Progress Notes (Signed)
PATIENT: Tyler Lester DOB: 1946-02-04  REASON FOR VISIT: follow up HISTORY FROM: patient  Chief Complaint  Patient presents with  . Follow-up    Worsening tremor. Caregiver present. New room. Caregiver mentioned that his tremors have gotten worse over the last 3 weeks. its now difficult for him to use the bathroom without wetting himself.      HISTORY OF PRESENT ILLNESS: Today 10/22/18 Tyler Lester is a 73 y.o. male here today for follow up for Parkinson's Disease and worsening tremor. He was seen in 04/2018 and Simemet was increased to 2 tablets four times daily. He states that he has not increased medication. He continues Sinemet 2 tablets at 8am, 12p and 2p. He has noticed worsening tremor. He is having a difficult time with using restroom. Tremor is noted at rest and worse with activity. He feels that he is slower with movements. He is using single point cane today but also uses Rolator at home. He was also referred to PT but did not go. He was referred to PT recently by PCP for hip pain. He has a caregiver with him today that reports other than tremor he seems to be doing well. No falls. He feels memory is stable on Aricept. He has a caregiver from 8a-5p. He lives alone. He has community support and friends that check on him in the evenings. He does not drive.    HISTORY: (copied from  note on 05/14/2018)  Tyler Lester is a 73 y.o. male here today for follow up Parkinson's Disease.  He reports doing fairly well with the exception of increased tremor in the left hand.  He feels that the tremor is worse in the mornings.  This tremor does improve after taking Sinemet.  He is also noticed some change in his gait.  He feels that he is not able to lift his right leg normally.  He denies any falls.  He does use a cane or walker with long distance walking.  He denies any dizziness or lightheadedness.  No swallowing difficulties no drooling.  He feels that his memory is stable.  He is taking  Aricept 10 mg daily as prescribed.  Sinemet 25/100 mg 2 capsules 3 times a day.  He reports dosing is typically at 8 AM, 12 PM and 6 PM.  HISTORY: (copied from Dr Cathren Laine note on 11/27/2017)  Interval history 11/27/2017: Patient is here for Parkinson's Disease. Unfortunately in the past he has been non compliant with medication and management plan and also refused help such as [facility] to help with his medication management. He did not come back because I discussed I couldn't help him. Today he returns and wonderfully he had a nurse/caregiver with him who is there at his home and helps with medication management.  No falls in the home. Using a walker. No swallowing difficulties. No hallucinations or elusions. No drooling. No dizziness, not lightheaded, good appetite. He has a tremor, Sinemet helps when he takes. He denies rem sleep disorder. Sleeping well. During the day awake and alert.  HPI: Tyler Lester is a 72y.o. male here as a follow up for Parkinsonism, memory loss, gait ataxia, non-compliance. Memory problems started years ago. The tremor is more at rest. And the jaw trembles as well. Feels like his voice is getting softer. He has vivid dreams. He has drool on his pillow. He has fallen a few times and he catches himslef. He has loss of smell and taste. He can't  smell dinner. He is swallowing OK. His handwriting has gotten smaller, when he writes memos he has noticed he has gotten smaller.   Interval history 02/15/2015:He does not know what medications he is taking. He has refused [facility] or home health care to help him with medications. He is a poor historian. I have asked him multiple times to bring in his medication bottles and he always forgets. i had a frank conversation with patient, I cannot manage his parkinsonism in this manner. He is supposed to be on Sinemet and Azilect. Called the pharmacy and he has not filled [place] since July. Sinemet was last filled on 12/5 but before then he  filled it in September for a month. Not compliant.  Interval history 11/17/2014: He is having neck pain. He was scheduled today to discuss memory loss and he was supposed to bring a family member but he forgot. He would like pain medication and gets Vicadin but Dr. Lonny Prude won't give hm anymore because he had alcohol in his urine. He is not wearing his neupro patch today. I am not sure he is taking his medications. He declines any services to help him in the house. He has not started the Lyrica. He never got the MRI of the brain completed that I ordered last December. He says the neupro patch doesn't work and gives him a lot of urges to have sex.  Interval History 03/22/2014: the tremor continues. He appears to be a poor historian and doesn't quite know what medications he is taking. He is not having any side effects from the Azilect and says he believes he is taking it. He also has neck pain and low back pain. He is on pain medication and is waiting to be seen by pain management. He had recent cervical spine surgery and has had lumber spine surgery in the past. He is having shooting pain from low back into right leg that is chronic and he is being managed by orthopaedic doctor. The orthopedist cancelled his pain medication because he was already getting vicodin from dr. Teryl Lucy and patient wants to know if I can give him the percocet instead of vicodin. He is only taking 200mg  three times daily (at first he said just once daily) of neurontin. No side effects from the gabapentin.   02/03/2014: Tyler Lester is a 73 y.o. male here as a referral from Dr. Lonny Prude for Tremor. PMHx cervical fusion (September), chronic back pain, DM, HTN, fatigue, depression, anxiety, myelopathy.   Was sent here by Dr. Teryl Lucy for evaluation of tremor. He has a tremor in his left hand and his jaw. Tremor has only been going on for at least a year but worse in the last 3 months. Getting worse after neck surgery. Worse in the left  but also in the right hand as well as the chin. He can't walk, he has chronic pain in his low back and neck. Walking slow, doesn't have energy. No tremors in other family members. Doesn't notice the tremor getting better with alcohol. No constipation. Can hardly smell. Sometimes he is talking low, softer. Hand writing is shaky, smaller. Feels weak. TSH was normal in June. Does not drink caffeine. 1-2 drinks a week, no significant alcohol. No FHx of parkinson's or neurodegenerative disease. Has had falls.   REVIEW OF SYSTEMS: Out of a complete 14 system review of symptoms, the patient complains only of the following symptoms, eye redness, leg swelling, tremors, neck pain, neck stiffness, and all other reviewed systems  are negative.  ALLERGIES: Allergies  Allergen Reactions  . Poison Ivy Treatments Hives    Pt denies allergy to this medication    HOME MEDICATIONS: Outpatient Medications Prior to Visit  Medication Sig Dispense Refill  . allopurinol (ZYLOPRIM) 300 MG tablet Take 1 tablet by mouth every day 30 tablet 11  . aspirin EC 81 MG tablet Take 1 tablet (81 mg total) by mouth daily. 30 tablet 11  . atorvastatin (LIPITOR) 20 MG tablet Take 1 tablet (20 mg total) by mouth daily. 30 tablet 11  . Cholecalciferol (VITAMIN D) 2000 units CAPS Take 1 capsule (2,000 Units total) by mouth daily. 90 capsule 11  . fluticasone (FLONASE) 50 MCG/ACT nasal spray Place 2 sprays into both nostrils daily. 16 g 11  . gabapentin (NEURONTIN) 100 MG capsule Take 2 capsules (200 mg total) by mouth 3 (three) times daily. 180 capsule 1  . Multiple Vitamins-Minerals (MULTIVITAMIN MEN 50+) TABS Take 1 tablet by mouth daily. 90 tablet 11  . naloxone (NARCAN) 2 MG/2ML injection Inject 0.4 mLs (0.4 mg total) into the muscle as needed (opiod overdose. May repeat in two to three minutes.). 2 mL 1  . naproxen (NAPROSYN) 500 MG tablet Take 1 tablet by mouth twice daily 60 tablet 11  . neomycin-bacitracin-polymyxin (NEOSPORIN)  ointment Apply 1 application topically every 12 (twelve) hours. 15 g 0  . NONFORMULARY OR COMPOUNDED ITEM [facility]:  Peripheral Neuropathy cream - Bupivacaine 1%, Doxepin 3%, Gabapentin 6%, Pentoxifylline 3%, Topiramate 1%, apply 1-2 grams to affected area 3-4 times daily. 120 each 2  . omeprazole (PRILOSEC) 20 MG capsule Take 1 capsule (20 mg total) by mouth daily. 30 capsule 3  . senna-docusate (SENOKOT-S) 8.6-50 MG tablet Take 1 tablet by mouth 2 (two) times daily. While taking strong pain meds to prevent constipation. 30 tablet 0  . SSD 1 % cream APPLY TOPICALLY DAILY 50 g 0  . donepezil (ARICEPT) 10 MG tablet Take 1 tablet (10 mg total) by mouth daily. (Patient not taking: Reported on 10/22/2018) 30 tablet 6  . lisinopril (ZESTRIL) 20 MG tablet Take 1 tablet (20 mg total) by mouth daily. (Patient not taking: Reported on 10/22/2018) 30 tablet 11  . oxyCODONE-acetaminophen (PERCOCET) 7.5-325 MG tablet TK 1 T PO Q 6 H PRN FOR PAIN  0  . terbinafine (LAMISIL) 250 MG tablet Take 1 tablet by mouth every day (Patient not taking: Reported on 10/22/2018) 30 tablet 11  . carbidopa-levodopa (SINEMET) 25-100 MG tablet Take 2 tablets by mouth 4 (four) times daily. Take 3 hours apart at 8am, 11am, 2pm and 5pm for example. Avoid high protein diet. 720 tablet 3   Facility-Administered Medications Prior to Visit  Medication Dose Route Frequency Provider Last Rate Last Dose  . magnesium citrate solution 1 Bottle  1 Bottle Oral Once Alexis Frock, MD        PAST MEDICAL HISTORY: Past Medical History:  Diagnosis Date  . Bilateral foot pain   . Chronic pain   . Diabetes mellitus    type 2, diet controlled now  . Foley catheter in place since nov 2018, last changed 1 week ago  . Hyperlipidemia   . Hypertension   . Inguinal hernia    right  . Numbness    T/O  . Parkinson's disease ([place])   . Pneumonia yrs ago  . Shortness of breath    with exertion  . Urinary retention     PAST SURGICAL  HISTORY: Past Surgical History:  Procedure  Laterality Date  . CATARACT EXTRACTION W/ INTRAOCULAR LENS IMPLANT Left 06/2012   laser for posterior capsule?  . COLON SURGERY    . POSTERIOR CERVICAL FUSION/FORAMINOTOMY N/A 11/12/2013   Procedure: Posterior cervical decompression fusion, cervical 3-4, cervical 4-5, cervical 5-6, cervical 6-7 with instrumentation and allograft   (LEVEL 4);  Surgeon: Sinclair Ship, MD;  Location: [facility];  Service: Orthopedics;  Laterality: N/A;  Posterior cervical decompression fusion, cervical 3-4, cervical 4-5, cervical 5-6, cervical 6-7 with instrumentation and allograft  . TONSILLECTOMY    . XI ROBOTIC ASSISTED SIMPLE PROSTATECTOMY N/A 03/07/2016   Procedure: XI ROBOTIC ASSISTED SIMPLE PROSTATECTOMY AND UMBILICAL HERNIA REPAIR flexible cystoscopy;  Surgeon: Alexis Frock, MD;  Location: [facility];  Service: Urology;  Laterality: N/A;    FAMILY HISTORY: Family History  Problem Relation Age of Onset  . Cancer - Other Other   . Diabetes Other   . Hypertension Other     SOCIAL HISTORY: Social History   Socioeconomic History  . Marital status: Single    Spouse name: Not on file  . Number of children: 2  . Years of education: 44  . Highest education level: Not on file  Occupational History  . Occupation: Retired     Comment: Event organiser at [employer]  . Financial resource strain: Not on file  . Food insecurity    Worry: Not on file    Inability: Not on file  . Transportation needs    Medical: Not on file    Non-medical: Not on file  Tobacco Use  . Smoking status: Never Smoker  . Smokeless tobacco: Never Used  Substance and Sexual Activity  . Alcohol use: Yes    Alcohol/week: 0.0 standard drinks    Comment: 1 shot a day  . Drug use: No  . Sexual activity: Not on file  Lifestyle  . Physical activity    Days per week: Not on file    Minutes per session: Not on file  . Stress: Not on file  Relationships  . Social Product manager on phone: Not on file    Gets together: Not on file    Attends religious service: Not on file    Active member of club or organization: Not on file    Attends meetings of clubs or organizations: Not on file    Relationship status: Not on file  . Intimate partner violence    Fear of current or ex partner: Not on file    Emotionally abused: Not on file    Physically abused: Not on file    Forced sexual activity: Not on file  Other Topics Concern  . Not on file  Social History Narrative   Patient lives at home alone.    Patient have 2 children.    Patient has a high school education    Patient is right handed.    Patient is retired on disability.       PHYSICAL EXAM  Vitals:   10/22/18 0949  BP: 125/66  Pulse: 85  Temp: 98.4 F (36.9 C)  TempSrc: Oral  Weight: 203 lb 12.8 oz (92.4 kg)  Height: 5\' 9"  (1.753 m)   Body mass index is 30.1 kg/m.  Generalized: Well developed, flat affect, in no acute distress  Cardiology: normal rate and rhythm, no murmur noted Neurological examination  Mentation: Alert oriented to time, place, history taking. Follows all commands speech and language fluent Cranial nerve II-XII: Pupils were  equal round reactive to light. Extraocular movements were full, visual field were full on confrontational test. Facial sensation and strength were normal. Uvula tongue midline. Head turning and shoulder shrug  were normal and symmetric. Motor: The motor testing reveals 5 over 5 strength of all 4 extremities. Good symmetric motor tone is noted throughout. Bradykinesia noted with finger taps and toe taps, no cogwheel rigidity  Sensory: Sensory testing is intact to soft touch on all 4 extremities. No evidence of extinction is noted.  Coordination: Cerebellar testing reveals slow but good finger-nose-finger and heel-to-shin bilaterally.  Gait and station: Gait is shuffeled, uses single point cane, wide turns  DIAGNOSTIC DATA (LABS, IMAGING, TESTING) -  I reviewed patient records, labs, notes, testing and imaging myself where available.  MMSE - Mini Mental State Exam 10/22/2018 05/14/2018  Not completed: (No Data) (No Data)  Orientation to time 5 4  Orientation to Place 3 5  Registration 3 3  Attention/ Calculation 0 0  Recall 3 2  Language- name 2 objects 2 2  Language- repeat 1 1  Language- follow 3 step command 3 3  Language- read & follow direction 1 1  Write a sentence 1 0  Copy design 1 1  Total score 23 22     Lab Results  Component Value Date   WBC 6.2 07/26/2016   HGB 15.1 07/26/2016   HCT 44.7 07/26/2016   MCV 86 07/26/2016   PLT 195 07/26/2016      Component Value Date/Time   NA 141 03/05/2018 1143   K 4.0 03/05/2018 1143   CL 100 03/05/2018 1143   CO2 26 03/05/2018 1143   GLUCOSE 116 (H) 03/05/2018 1143   GLUCOSE 172 (H) 03/08/2016 0545   BUN 14 03/05/2018 1143   CREATININE 1.55 (H) 03/05/2018 1143   CREATININE 1.53 (H) 10/20/2015 1213   CALCIUM 9.8 03/05/2018 1143   PROT 6.6 08/05/2017 1541   ALBUMIN 3.7 08/05/2017 1541   AST 21 08/05/2017 1541   ALT 5 08/05/2017 1541   ALKPHOS 49 08/05/2017 1541   BILITOT 0.4 08/05/2017 1541   GFRNONAA 45 (L) 03/05/2018 1143   GFRNONAA 46 (L) 10/20/2015 1213   GFRAA 52 (L) 03/05/2018 1143   GFRAA 53 (L) 10/20/2015 1213   Lab Results  Component Value Date   CHOL 159 10/20/2015   HDL 32 (L) 10/20/2015   LDLCALC 66 10/20/2015   LDLDIRECT 85 07/04/2011   TRIG 307 (H) 10/20/2015   CHOLHDL 5.0 10/20/2015   Lab Results  Component Value Date   HGBA1C 5.8 12/28/2016   Lab Results  Component Value Date   VITAMINB12 835 11/27/2017   Lab Results  Component Value Date   TSH 1.990 07/26/2016     ASSESSMENT AND PLAN 73 y.o. year old male  has a past medical history of Bilateral foot pain, Chronic pain, Diabetes mellitus, Foley catheter in place (since nov 2018, last changed 1 week ago), Hyperlipidemia, Hypertension, Inguinal hernia, Numbness, Parkinson's disease  ([place]), Pneumonia (yrs ago), Shortness of breath, and Urinary retention. here with     ICD-10-CM   1. Parkinson's disease ([place])  G20   2. Parkinson disease ([place])  G20 carbidopa-levodopa (SINEMET) 25-100 MG tablet    Malyk continues to have concerns of increased tremor.  Unfortunately, he did not increase Sinemet as discussed at last visit in March.  We have discussed this in detail with him and his caregiver.  I have called in prescription for Sinemet 2 tablets 4 times daily.  Suggested times are 8 AM, 11 AM, 3 PM and 5 PM.  I have provided details in AVS as well.  We have discussed PT referral.  He wishes to wait for primary care referral that has already been placed.  He and his caregiver are aware to call if he does not hear back from someone regarding referral.  We will continue Aricept 10 mg at night.  MMSE is stable at 22 of 30.  I have updated Dr. Lavell Anchors on our plan for today.  We will have patient follow-up with her in 3 months.  I will stay closely involved at that visit as well.  Safety and fall precautions given.  I have suggested that he wear his alert necklace at all times but especially when he is alone.  He will call with any worsening symptoms.  He verbalizes understanding and agreement with this plan.   No orders of the defined types were placed in this encounter.    Meds ordered this encounter  Medications  . carbidopa-levodopa (SINEMET) 25-100 MG tablet    Sig: Take 2 tablets by mouth 4 (four) times daily. Take 3 hours apart at 8am, 11am, 2pm and 5pm for example. Avoid high protein diet.    Dispense:  720 tablet    Refill:  3    Order Specific Question:   Supervising Provider    Answer:   Melvenia Beam [id number]      I spent 15 minutes with the patient. 50% of this time was spent counseling and educating patient on plan of care and medications.    Debbora Presto, FNP-C 10/22/2018, 11:04 AM [facility] [address] [place], [place] [address] [phone]

## 2018-11-11 ENCOUNTER — Other Ambulatory Visit: Payer: Self-pay | Admitting: Family Medicine

## 2018-11-17 ENCOUNTER — Ambulatory Visit: Payer: Medicare Other | Admitting: Neurology

## 2018-11-24 ENCOUNTER — Ambulatory Visit: Payer: Medicare Other

## 2018-11-24 ENCOUNTER — Ambulatory Visit (INDEPENDENT_AMBULATORY_CARE_PROVIDER_SITE_OTHER): Payer: Medicare Other | Admitting: Family Medicine

## 2018-11-24 ENCOUNTER — Ambulatory Visit (HOSPITAL_COMMUNITY)
Admission: RE | Admit: 2018-11-24 | Discharge: 2018-11-24 | Disposition: A | Discharge status: Home / Self Care | Payer: Medicare Other | Source: Ambulatory Visit | Attending: Family Medicine | Admitting: Family Medicine

## 2018-11-24 ENCOUNTER — Other Ambulatory Visit: Payer: Self-pay

## 2018-11-24 ENCOUNTER — Encounter: Payer: Self-pay | Admitting: Family Medicine

## 2018-11-24 VITALS — BP 120/64 | HR 74

## 2018-11-24 DIAGNOSIS — I499 Cardiac arrhythmia, unspecified: Secondary | ICD-10-CM | POA: Insufficient documentation

## 2018-11-24 DIAGNOSIS — H9192 Unspecified hearing loss, left ear: Secondary | ICD-10-CM

## 2018-11-24 DIAGNOSIS — R002 Palpitations: Secondary | ICD-10-CM | POA: Insufficient documentation

## 2018-11-24 NOTE — Assessment & Plan Note (Signed)
Patient endorsing heart palpitations intermittently with increased respiratory rate. I did hear some extra beats on exam today but the rhythm was not irregularly irregular. Suspect perhaps patient is having some PVCs. - EKG normal today but given hx of heart failure he may benefit from cariology consult. - Consult to Cardiology as patient may benefit from heart monitor for 30 days

## 2018-11-24 NOTE — Patient Instructions (Signed)
It was great to meet you today! Thank you for letting me participate in your care!  Today, we discussed feelings of your heart fluttering and breathing hard. Your EKG was normal today but I will still send you to Cardiology for further workup.  Be well, Tyler Rutherford, DO PGY-3, [facility]

## 2018-11-24 NOTE — Progress Notes (Signed)
     Subjective: Chief Complaint  Patient presents with  . Breathing Problem  . left ear problem    HPI: Tyler Lester is a 73 y.o. presenting to clinic today to discuss the following:  "feels like my heart flutters" Patient states yesterday he had a feeling of his "heart fluttering". He also had increased respiratory rate during that time. It lasted about 10-15 minutes while he was at rest. He had no SOB, no difficulty breathing, no chest pain or pressure. No dizziness, no syncope or near syncope. He states these episodes began "over a year ago" and occur about once per month. No increased stress or anxiety.   Left ear "clogged" Patient states for the past month it his ear has felt "stopped up" and he could not hear as good. He states it is as if his ear is "full". Sometimes it itches but no pain, drainage. He does believe his hearing is more "muffled" than usual. No fevers, chills, jaw pain, or sore throat.  ROS noted in HPI.    Social History   Tobacco Use  Smoking Status Never Smoker  Smokeless Tobacco Never Used   Objective: BP 120/64   Pulse 74   SpO2 97%  Vitals and nursing notes reviewed  Physical Exam Gen: Alert and Oriented x 3, NAD HEENT: Normocephalic, atraumatic, PERRLA, EOMI, TM visible with good light reflex, non-swollen, non-erythematous turbinates Neck: trachea midline, no thyroidmegaly, no LAD CV: Reg rate with irregular rhythm, no murmurs, normal S1, S2 split Resp: CTAB, no wheezing, rales, or rhonchi, comfortable work of breathing Ext: no clubbing, cyanosis, or edema Skin: warm, dry, intact, no rashes  Assessment/Plan:  Palpitations Patient endorsing heart palpitations intermittently with increased respiratory rate. I did hear some extra beats on exam today but the rhythm was not irregularly irregular. Suspect perhaps patient is having some PVCs. - EKG normal today but given hx of heart failure he may benefit from cariology consult. - Consult to  Cardiology as patient may benefit from heart monitor for 30 days  Diminished hearing, left Cerumen impaction - ear flushed and patient expressed relief of symptoms of ear feeling "full"  PATIENT EDUCATION PROVIDED: See AVS    Diagnosis and plan along with any newly prescribed medication(s) were discussed in detail with this patient today. The patient verbalized understanding and agreed with the plan. Patient advised if symptoms worsen return to clinic or ER.    Orders Placed This Encounter  Procedures  . Ambulatory referral to Cardiology    Referral Priority:   Routine    Referral Type:   Consultation    Referral Reason:   Specialty Services Required    Requested Specialty:   Cardiology    Number of Visits Requested:   1  . EKG 12-Lead    No orders of the defined types were placed in this encounter.    Harolyn Rutherford, DO 11/24/2018, 11:07 AM PGY-3 [facility]

## 2018-11-24 NOTE — Assessment & Plan Note (Signed)
Cerumen impaction - ear flushed and patient expressed relief of symptoms of ear feeling "full"

## 2018-11-25 ENCOUNTER — Other Ambulatory Visit: Payer: Self-pay | Admitting: Family Medicine

## 2018-12-09 ENCOUNTER — Other Ambulatory Visit: Payer: Self-pay | Admitting: Family Medicine

## 2018-12-23 ENCOUNTER — Ambulatory Visit: Payer: Medicare Other | Admitting: Cardiovascular Disease

## 2018-12-29 ENCOUNTER — Ambulatory Visit: Payer: Medicare Other

## 2019-01-26 ENCOUNTER — Encounter: Payer: Self-pay | Admitting: Neurology

## 2019-01-26 ENCOUNTER — Other Ambulatory Visit: Payer: Self-pay

## 2019-01-26 ENCOUNTER — Ambulatory Visit (INDEPENDENT_AMBULATORY_CARE_PROVIDER_SITE_OTHER): Payer: Medicare Other | Admitting: Neurology

## 2019-01-26 VITALS — BP 70/48 | HR 100 | Temp 97.1°F | Ht 69.0 in | Wt 198.0 lb

## 2019-01-26 DIAGNOSIS — M4712 Other spondylosis with myelopathy, cervical region: Secondary | ICD-10-CM | POA: Diagnosis not present

## 2019-01-26 DIAGNOSIS — R269 Unspecified abnormalities of gait and mobility: Secondary | ICD-10-CM | POA: Diagnosis not present

## 2019-01-26 DIAGNOSIS — G959 Disease of spinal cord, unspecified: Secondary | ICD-10-CM | POA: Diagnosis not present

## 2019-01-26 DIAGNOSIS — G20 Parkinson's disease: Secondary | ICD-10-CM | POA: Diagnosis not present

## 2019-01-26 MED ORDER — CARBIDOPA-LEVODOPA 25-100 MG PO TABS: 1.0000 | ORAL_TABLET | Freq: Four times a day (QID) | ORAL | 1 refills | Status: DC

## 2019-01-26 NOTE — Progress Notes (Signed)
PATIENT: Tyler Lester DOB: 10-May-1945  Interval history 01/26/2019: Patient appears to be having dyskinesias. Difficult to address as he is a poor historian as is his caretaker, we have asked in the past for more details on his medication, turning on and turning off, times of his medication. I had a long discussion, will need more information on when he takes his medication and when they notice the dyskinesias. At this time will decrease his medication and see if the dyskinesias improve, may consider Rytary or Amantadine.  It just started 7 days ago. He takes his medication at 11am, 2pm and 5pm. He takes 2 pills each time. She comes in at 9am, he last took his medication at 8am he is not sure so 3.5 hours ago. He never had PT. We will send PT to his home, they have not paid attention on the medication when it starts working or when the dyskinesias occr, when the medication wears off.   REASON FOR VISIT: follow up HISTORY FROM: patient  Chief Complaint  Patient presents with   Follow-up    his symptoms are worse. his friend says his whole body seems to be moving now instead of hands. She thinks the surgery he had on his neck may have something to do with it.    Room 3    here with a friend/caregiver      HISTORY OF PRESENT ILLNESS: Today 01/26/19 Tyler Lester is a 73 y.o. male here today for follow up for Parkinson's Disease and worsening tremor. He was seen in 04/2018 and Simemet was increased to 2 tablets four times daily. He states that he has not increased medication. He continues Sinemet 2 tablets at 8am, 12p and 2p. He has noticed worsening tremor. He is having a difficult time with using restroom. Tremor is noted at rest and worse with activity. He feels that he is slower with movements. He is using single point cane today but also uses Rolator at home. He was also referred to PT but did not go. He was referred to PT recently by PCP for hip pain. He has a caregiver with him today  that reports other than tremor he seems to be doing well. No falls. He feels memory is stable on Aricept. He has a caregiver from 8a-5p. He lives alone. He has community support and friends that check on him in the evenings. He does not drive.    HISTORY: (copied from  note on 05/14/2018)  Tyler Lester is a 73 y.o. male here today for follow up Parkinson's Disease.  He reports doing fairly well with the exception of increased tremor in the left hand.  He feels that the tremor is worse in the mornings.  This tremor does improve after taking Sinemet.  He is also noticed some change in his gait.  He feels that he is not able to lift his right leg normally.  He denies any falls.  He does use a cane or walker with long distance walking.  He denies any dizziness or lightheadedness.  No swallowing difficulties no drooling.  He feels that his memory is stable.  He is taking Aricept 10 mg daily as prescribed.  Sinemet 25/100 mg 2 capsules 3 times a day.  He reports dosing is typically at 8 AM, 12 PM and 6 PM.  HISTORY: (copied from Dr Cathren Laine note on 11/27/2017)  Interval history 11/27/2017: Patient is here for Parkinson's Disease. Unfortunately in the past he has been  non compliant with medication and management plan and also refused help such as [facility] to help with his medication management. He did not come back because I discussed I couldn't help him. Today he returns and wonderfully he had a nurse/caregiver with him who is there at his home and helps with medication management.  No falls in the home. Using a walker. No swallowing difficulties. No hallucinations or elusions. No drooling. No dizziness, not lightheaded, good appetite. He has a tremor, Sinemet helps when he takes. He denies rem sleep disorder. Sleeping well. During the day awake and alert.  HPI: Tyler Lester is a 72y.o. male here as a follow up for Parkinsonism, memory loss, gait ataxia, non-compliance. Memory problems started years ago.  The tremor is more at rest. And the jaw trembles as well. Feels like his voice is getting softer. He has vivid dreams. He has drool on his pillow. He has fallen a few times and he catches himslef. He has loss of smell and taste. He can't smell dinner. He is swallowing OK. His handwriting has gotten smaller, when he writes memos he has noticed he has gotten smaller.   Interval history 02/15/2015:He does not know what medications he is taking. He has refused [facility] or home health care to help him with medications. He is a poor historian. I have asked him multiple times to bring in his medication bottles and he always forgets. i had a frank conversation with patient, I cannot manage his parkinsonism in this manner. He is supposed to be on Sinemet and Azilect. Called the pharmacy and he has not filled [place] since July. Sinemet was last filled on 12/5 but before then he filled it in September for a month. Not compliant.  Interval history 11/17/2014: He is having neck pain. He was scheduled today to discuss memory loss and he was supposed to bring a family member but he forgot. He would like pain medication and gets Vicadin but Dr. Lonny Prude won't give hm anymore because he had alcohol in his urine. He is not wearing his neupro patch today. I am not sure he is taking his medications. He declines any services to help him in the house. He has not started the Lyrica. He never got the MRI of the brain completed that I ordered last December. He says the neupro patch doesn't work and gives him a lot of urges to have sex.  Interval History 03/22/2014: the tremor continues. He appears to be a poor historian and doesn't quite know what medications he is taking. He is not having any side effects from the Azilect and says he believes he is taking it. He also has neck pain and low back pain. He is on pain medication and is waiting to be seen by pain management. He had recent cervical spine surgery and has had lumber spine  surgery in the past. He is having shooting pain from low back into right leg that is chronic and he is being managed by orthopaedic doctor. The orthopedist cancelled his pain medication because he was already getting vicodin from dr. Teryl Lucy and patient wants to know if I can give him the percocet instead of vicodin. He is only taking 200mg  three times daily (at first he said just once daily) of neurontin. No side effects from the gabapentin.   02/03/2014: Tyler Lester is a 73 y.o. male here as a referral from Dr. Lonny Prude for Tremor. PMHx cervical fusion (September), chronic back pain, DM, HTN, fatigue, depression, anxiety,  myelopathy.   Was sent here by Dr. Teryl Lucy for evaluation of tremor. He has a tremor in his left hand and his jaw. Tremor has only been going on for at least a year but worse in the last 3 months. Getting worse after neck surgery. Worse in the left but also in the right hand as well as the chin. He can't walk, he has chronic pain in his low back and neck. Walking slow, doesn't have energy. No tremors in other family members. Doesn't notice the tremor getting better with alcohol. No constipation. Can hardly smell. Sometimes he is talking low, softer. Hand writing is shaky, smaller. Feels weak. TSH was normal in June. Does not drink caffeine. 1-2 drinks a week, no significant alcohol. No FHx of parkinson's or neurodegenerative disease. Has had falls.   REVIEW OF SYSTEMS: Out of a complete 14 system review of symptoms, the patient complains only of the following symptoms, eye redness, leg swelling, tremors, neck pain, neck stiffness, and all other reviewed systems are negative.  ALLERGIES: Allergies  Allergen Reactions   Poison Ivy Treatments Hives    Pt denies allergy to this medication    HOME MEDICATIONS: Outpatient Medications Prior to Visit  Medication Sig Dispense Refill   atorvastatin (LIPITOR) 20 MG tablet Take 1 tablet (20 mg total) by mouth daily. 30 tablet 11    donepezil (ARICEPT) 10 MG tablet Take 1 Tablet by mouth every day 30 tablet 11   fluticasone (FLONASE) 50 MCG/ACT nasal spray Place 2 sprays into both nostrils daily. 16 g 11   gabapentin (NEURONTIN) 100 MG capsule TAKE 2 CAPSULES (200 MG TOTAL) BY MOUTH 3 (THREE) TIMES DAILY. 180 capsule 1   Multiple Vitamins-Minerals (MULTIVITAMIN MEN 50+) TABS Take 1 tablet by mouth daily. 90 tablet 11   naproxen (NAPROSYN) 500 MG tablet Take 1 tablet by mouth twice daily 60 tablet 11   omeprazole (PRILOSEC) 20 MG capsule TAKE 1 CAPSULE BY MOUTH EVERY DAY 90 capsule 3   carbidopa-levodopa (SINEMET) 25-100 MG tablet Take 2 tablets by mouth 4 (four) times daily. Take 3 hours apart at 8am, 11am, 2pm and 5pm for example. Avoid high protein diet. 720 tablet 3   allopurinol (ZYLOPRIM) 300 MG tablet Take 1 tablet by mouth every day (Patient not taking: Reported on 01/26/2019) 30 tablet 11   aspirin EC 81 MG tablet Take 1 tablet (81 mg total) by mouth daily. (Patient not taking: Reported on 01/26/2019) 30 tablet 11   Cholecalciferol (VITAMIN D) 2000 units CAPS Take 1 capsule (2,000 Units total) by mouth daily. (Patient not taking: Reported on 01/26/2019) 90 capsule 11   lisinopril (ZESTRIL) 20 MG tablet Take 1 tablet (20 mg total) by mouth daily. (Patient not taking: Reported on 10/22/2018) 30 tablet 11   naloxone (NARCAN) 2 MG/2ML injection Inject 0.4 mLs (0.4 mg total) into the muscle as needed (opiod overdose. May repeat in two to three minutes.). (Patient not taking: Reported on 01/26/2019) 2 mL 1   NONFORMULARY OR COMPOUNDED ITEM [facility]:  Peripheral Neuropathy cream - Bupivacaine 1%, Doxepin 3%, Gabapentin 6%, Pentoxifylline 3%, Topiramate 1%, apply 1-2 grams to affected area 3-4 times daily. (Patient not taking: Reported on 01/26/2019) 120 each 2   oxyCODONE-acetaminophen (PERCOCET) 7.5-325 MG tablet TK 1 T PO Q 6 H PRN FOR PAIN  0   senna-docusate (SENOKOT-S) 8.6-50 MG tablet Take 1 tablet by  mouth 2 (two) times daily. While taking strong pain meds to prevent constipation. (Patient not taking: Reported on  01/26/2019) 30 tablet 0   SSD 1 % cream APPLY TOPICALLY DAILY (Patient not taking: Reported on 01/26/2019) 50 g 0   terbinafine (LAMISIL) 250 MG tablet Take 1 tablet by mouth every day (Patient not taking: Reported on 10/22/2018) 30 tablet 11   neomycin-bacitracin-polymyxin (NEOSPORIN) ointment Apply 1 application topically every 12 (twelve) hours. (Patient not taking: Reported on 01/26/2019) 15 g 0   Facility-Administered Medications Prior to Visit  Medication Dose Route Frequency Provider Last Rate Last Dose   magnesium citrate solution 1 Bottle  1 Bottle Oral Once Alexis Frock, MD        PAST MEDICAL HISTORY: Past Medical History:  Diagnosis Date   Bilateral foot pain    Chronic pain    Diabetes mellitus    type 2, diet controlled now   Foley catheter in place since nov 2018, last changed 1 week ago   Hyperlipidemia    Hypertension    Inguinal hernia    right   Numbness    T/O   Parkinson's disease ([place])    Pneumonia yrs ago   Shortness of breath    with exertion   Urinary retention     PAST SURGICAL HISTORY: Past Surgical History:  Procedure Laterality Date   CATARACT EXTRACTION W/ INTRAOCULAR LENS IMPLANT Left 06/2012   laser for posterior capsule?   COLON SURGERY     POSTERIOR CERVICAL FUSION/FORAMINOTOMY N/A 11/12/2013   Procedure: Posterior cervical decompression fusion, cervical 3-4, cervical 4-5, cervical 5-6, cervical 6-7 with instrumentation and allograft   (LEVEL 4);  Surgeon: Sinclair Ship, MD;  Location: [facility];  Service: Orthopedics;  Laterality: N/A;  Posterior cervical decompression fusion, cervical 3-4, cervical 4-5, cervical 5-6, cervical 6-7 with instrumentation and allograft   TONSILLECTOMY     XI ROBOTIC ASSISTED SIMPLE PROSTATECTOMY N/A 03/07/2016   Procedure: XI ROBOTIC ASSISTED SIMPLE PROSTATECTOMY AND  UMBILICAL HERNIA REPAIR flexible cystoscopy;  Surgeon: Alexis Frock, MD;  Location: [facility];  Service: Urology;  Laterality: N/A;    FAMILY HISTORY: Family History  Problem Relation Age of Onset   Cancer - Other Other    Diabetes Other    Hypertension Other     SOCIAL HISTORY: Social History   Socioeconomic History   Marital status: Single    Spouse name: Not on file   Number of children: 2   Years of education: 12   Highest education level: Not on file  Occupational History   Occupation: Retired     Comment: Event organiser at [employer] strain: Not on file   Food insecurity    Worry: Not on file    Inability: Not on file   Transportation needs    Medical: Not on file    Non-medical: Not on file  Tobacco Use   Smoking status: Never Smoker   Smokeless tobacco: Never Used  Substance and Sexual Activity   Alcohol use: Yes    Alcohol/week: 0.0 standard drinks    Comment: 1 shot a day   Drug use: No   Sexual activity: Not on file  Lifestyle   Physical activity    Days per week: Not on file    Minutes per session: Not on file   Stress: Not on file  Relationships   Social connections    Talks on phone: Not on file    Gets together: Not on file    Attends religious service: Not on file    Active member of  club or organization: Not on file    Attends meetings of clubs or organizations: Not on file    Relationship status: Not on file   Intimate partner violence    Fear of current or ex partner: Not on file    Emotionally abused: Not on file    Physically abused: Not on file    Forced sexual activity: Not on file  Other Topics Concern   Not on file  Social History Narrative   Patient lives at home alone.  He has a caregiver from 9 AM- 5 PM Monday through Sunday.   Patient have 2 children.    Patient has a high school education    Patient is right handed.    Patient is retired on disability.       PHYSICAL  EXAM  Vitals:   01/26/19 1126 01/26/19 1131  BP: (!) 78/42 (!) 70/48  Pulse: 100   Temp: (!) 97.1 F (36.2 C)   Weight: 198 lb (89.8 kg)   Height: 5\' 9"  (1.753 m)    Body mass index is 29.24 kg/m.  Generalized: Well developed, flat affect, in no acute distress  Cardiology: normal rate and rhythm, no murmur noted Neurological examination  Mentation: Alert oriented to time, place, history taking. Follows all commands speech and language fluent Cranial nerve II-XII: Pupils were equal round reactive to light. Extraocular movements were full, visual field were full on confrontational test. Facial sensation and strength were normal. Uvula tongue midline. Head turning and shoulder shrug  were normal and symmetric. Motor: The motor testing reveals 5 over 5 strength of all 4 extremities. Good symmetric motor tone is noted throughout. Bradykinesia noted with finger taps and toe taps, no cogwheel rigidity  Sensory: Sensory testing is intact to soft touch on all 4 extremities. No evidence of extinction is noted.  Coordination: Cerebellar testing reveals slow but good finger-nose-finger and heel-to-shin bilaterally.  Gait and station: Gait is shuffeled, uses single point cane, wide turns, spastic  DIAGNOSTIC DATA (LABS, IMAGING, TESTING) - I reviewed patient records, labs, notes, testing and imaging myself where available.  MMSE - Mini Mental State Exam 10/22/2018 05/14/2018  Not completed: (No Data) (No Data)  Orientation to time 5 4  Orientation to Place 3 5  Registration 3 3  Attention/ Calculation 0 0  Recall 3 2  Language- name 2 objects 2 2  Language- repeat 1 1  Language- follow 3 step command 3 3  Language- read & follow direction 1 1  Write a sentence 1 0  Copy design 1 1  Total score 23 22     Lab Results  Component Value Date   WBC 6.2 07/26/2016   HGB 15.1 07/26/2016   HCT 44.7 07/26/2016   MCV 86 07/26/2016   PLT 195 07/26/2016      Component Value Date/Time   NA  141 03/05/2018 1143   K 4.0 03/05/2018 1143   CL 100 03/05/2018 1143   CO2 26 03/05/2018 1143   GLUCOSE 116 (H) 03/05/2018 1143   GLUCOSE 172 (H) 03/08/2016 0545   BUN 14 03/05/2018 1143   CREATININE 1.55 (H) 03/05/2018 1143   CREATININE 1.53 (H) 10/20/2015 1213   CALCIUM 9.8 03/05/2018 1143   PROT 6.6 08/05/2017 1541   ALBUMIN 3.7 08/05/2017 1541   AST 21 08/05/2017 1541   ALT 5 08/05/2017 1541   ALKPHOS 49 08/05/2017 1541   BILITOT 0.4 08/05/2017 1541   GFRNONAA 45 (L) 03/05/2018 1143   GFRNONAA 46 (  L) 10/20/2015 1213   GFRAA 52 (L) 03/05/2018 1143   GFRAA 53 (L) 10/20/2015 1213   Lab Results  Component Value Date   CHOL 159 10/20/2015   HDL 32 (L) 10/20/2015   LDLCALC 66 10/20/2015   LDLDIRECT 85 07/04/2011   TRIG 307 (H) 10/20/2015   CHOLHDL 5.0 10/20/2015   Lab Results  Component Value Date   HGBA1C 5.8 12/28/2016   Lab Results  Component Value Date   VITAMINB12 835 11/27/2017   Lab Results  Component Value Date   TSH 1.990 07/26/2016     ASSESSMENT AND PLAN 73 y.o. year old male  has a past medical history of Bilateral foot pain, Chronic pain, Diabetes mellitus, Foley catheter in place (since nov 2018, last changed 1 week ago), Hyperlipidemia, Hypertension, Inguinal hernia, Numbness, Parkinson's disease ([place]), Pneumonia (yrs ago), Shortness of breath, and Urinary retention. here with     ICD-10-CM   1. Gait abnormality  R26.9 Ambulatory referral to [facility]  2. Parkinson disease ([place])  G20 carbidopa-levodopa (SINEMET) 25-100 MG tablet    Ambulatory referral to [facility]  3. Disease of spinal cord (HCC)  G95.9 MR CERVICAL SPINE WO CONTRAST  4. Cervical arthritis with myelopathy  M47.12 MR CERVICAL SPINE WO CONTRAST    Tyler Lester continues to have concerns of increased tremor.  Unfortunately, he is a very poor historian and we have had difficulty with him and his caretaker with medication compliance. He is having  dyskinesias.  Meds ordered this encounter  Medications   carbidopa-levodopa (SINEMET) 25-100 MG tablet    Sig: Take 1-1.5 tablets by mouth 4 (four) times daily. Take 3-4 hours apart such at 8am, 11-12am, 2-4pm and 6-8pm for example. Avoid high protein diet.    Dispense:  540 tablet    Refill:  1   -  We had a long discussion, will need more information on when he takes his medication and when they notice the dyskinesias. At this time will decrease his medication and see if the dyskinesias improve, may consider Rytary or Amantadine.  -   We have discussed PT referral.    -  We will continue Aricept 10 mg at night.  MMSE is stable at 22 of 30.   -.  Safety and fall precautions given.  I have suggested that he wear his alert necklace at all times but especially when he is alone.   - chronic neck pain, s/p decompression, worsening, need mri cervical spine to evaluate for new myelopathy contributing to abnormal movements and gait disorder  Orders Placed This Encounter  Procedures   MR CERVICAL SPINE WO CONTRAST   Ambulatory referral to [facility]    A total of 40  minutes was spent face-to-face with this patient. Over half this time was spent on counseling patient on the  1. Gait abnormality   2. Parkinson disease ([place])   3. Disease of spinal cord (HCC)   4. Cervical arthritis with myelopathy    diagnosis and different diagnostic and therapeutic options, counseling and coordination of care, risks ans benefits of management, compliance, or risk factor reduction and education.

## 2019-01-26 NOTE — Patient Instructions (Signed)
1-1.5 Sinemet every 3 -4 hours. Does it get better or worse? When does it happen - when you are wearing off or 1-2 hours after you take your pills? May consider starting Amantadine for the dyskinesias  Amantadine capsules or tablets What is this medicine? AMANTADINE (a MAN ta deen) is an antiviral medicine. It is used to prevent and to treat a specific type of flu called influenza A. It will not work for colds, other types of flu, or other viral infections. This medicine is also used to treat Parkinson's disease and other movement disorders. This medicine may be used for other purposes; ask your health care provider or pharmacist if you have questions. COMMON BRAND NAME(S): Symmetrel What should I tell my health care provider before I take this medicine? They need to know if you have any of these conditions:  depression or other mental illness  eczema  glaucoma  heart failure  if you drink alcohol  kidney disease  low blood pressure  narcolepsy  seizures  sleep apnea  suicidal thoughts, plans, or attempt; a previous suicide attempt by you or a family member  an unusual or allergic reaction to amantadine, other medicines, foods, dyes, or preservatives  pregnant or trying to get pregnant  breast-feeding How should I use this medicine? Take this medicine by mouth with a full glass of water. Follow the directions on the prescription label. Take your medicine at regular intervals. Do not take your medicine more often than directed. Take all of your medicine as directed even if you think your are better. Do not skip doses or stop your medicine early. Contact your pediatrician or health care professional regarding the useof this medicine in children. While this drug may be prescribed for children as young as 102 year old for selected conditions, precautions do apply. Patients over 71 years old may have a stronger reaction and need a smaller dose. Overdosage: If you think you have  taken too much of this medicine contact a poison control center or emergency room at once. NOTE: This medicine is only for you. Do not share this medicine with others. What if I miss a dose? If you miss a dose, take it as soon as you can. If it is almost time for your next dose, take only that dose. Do not take double or extra doses. What may interact with this medicine?  acetazolamide  alcohol  atropine  antihistamines for allergy, cough and cold  benztropine  bupropion  certain medicines for bladder problems like oxybutynin, tolterodine  certain medicines for stomach problems like dicyclomine, hyoscyamine  certain medicines for travel sickness like scopolamine  ipratropium  methazolamide  quinidine  quinine  sodium bicarbonate  some flu vaccines  thioridazine  trihexyphenidyl This list may not describe all possible interactions. Give your health care provider a list of all the medicines, herbs, non-prescription drugs, or dietary supplements you use. Also tell them if you smoke, drink alcohol, or use illegal drugs. Some items may interact with your medicine. What should I watch for while using this medicine? Tell your doctor or health care professional if your symptoms do not improve. You may get drowsy or dizzy. Do not drive, use machinery, or do anything that needs mental alertness until you know how this medicine affects you. Do not stand or sit up quickly, especially if you are an older patient. This reduces the risk of dizzy or fainting spells. Alcohol may interfere with the effect of this medicine. Avoid alcoholic drinks. If  you are taking this medicine for Parkinson's disease or a movement disorder, be careful. Slowly increase your daily activities as your condition improves. Do not suddenly stop taking your medicine because you may develop a severe reaction. You may get dry mouth or eyes, or blurry vision while taking this medicine. Try sugarless gum or hard  candy, and drink 6 to 8 glasses of water daily. Brush and floss your teeth regularly and carefully to avoid teeth and gum problems. You may want to wet your eyes with lubricating eye drops. Talk to your doctor if these symptoms become a problem. There have been reports of increased sexual urges or other strong urges such as gambling while taking some medicines for Parkinson's disease. If you experience any of these urges while taking this medicine, you should report it to your health care provider as soon as possible. You should check your skin often for changes to moles and new growths while taking this medicine. Call your doctor if you notice any of these changes. What side effects may I notice from receiving this medicine? Side effects that you should report to your doctor or health care professional as soon as possible:  allergic reactions like skin rash, itching or hives, swelling of the face, lips, or tongue  anxiety  breathing problems  changes in vision  color changes on the skin  confusion  depressed mood  eye pain  falling asleep during normal activities like driving  hallucination, loss of contact with reality  new or increased gambling urges, sexual urges, uncontrolled spending, binge or compulsive eating, or other urges  seizures  signs and symptoms of low blood pressure like dizziness; feeling faint or lightheaded, falls; unusually weak or tired  swelling in your legs and feet  suicidal thoughts or other mood changes  trouble passing urine or change in the amount of urine  trouble sleeping  uncontrolled movements of the mouth, head, hands, feet, shoulders, eyelids or other unusual muscle movements Side effects that usually do not require medical attention (report these to your doctor or health care professional if they continue or are bothersome):  constipation  dizziness  drowsiness  dry mouth  headache  nausea This list may not describe all  possible side effects. Call your doctor for medical advice about side effects. You may report side effects to FDA at 1-800-FDA-1088. Where should I keep my medicine? Keep out of the reach of children. Store at room temperature between 20 and 25 degrees C (68 and 77 degrees F). Keep container tightly closed. Throw away any unused medicine after the expiration date. NOTE: This sheet is a summary. It may not cover all possible information. If you have questions about this medicine, talk to your doctor, pharmacist, or health care provider.  2020 Elsevier/Gold Standard (2015-10-28 12:06:00)  Carbidopa; Levodopa tablets What is this medicine? CARBIDOPA;LEVODOPA (kar bi DOE pa; lee voe DOE pa) is used to treat the symptoms of Parkinson's disease. This medicine may be used for other purposes; ask your health care provider or pharmacist if you have questions. COMMON BRAND NAME(S): Atamet, SINEMET What should I tell my health care provider before I take this medicine? They need to know if you have any of these conditions:  asthma or lung disease  depression or other mental illness  diabetes  glaucoma  heart disease, including history of a heart attack  irregular heart beat  kidney or liver disease  melanoma or suspicious skin lesions  stomach or intestine ulcers  an unusual  or allergic reaction to levodopa, carbidopa, other medicines, foods, dyes, or preservatives  pregnant or trying to get pregnant  breast-feeding How should I use this medicine? Take this medicine by mouth with a glass of water. Follow the directions on the prescription label. Take your doses at regular intervals. Do not take your medicine more often than directed. Do not stop taking except on the advice of your doctor or health care professional. Talk to your pediatrician regarding the use of this medicine in children. Special care may be needed. Overdosage: If you think you have taken too much of this medicine  contact a poison control center or emergency room at once. NOTE: This medicine is only for you. Do not share this medicine with others. What if I miss a dose? If you miss a dose, take it as soon as you can. If it is almost time for your next dose, take only that dose. Do not take double or extra doses. What may interact with this medicine? Do not take this medicine with any of the following medications:  MAOIs like Marplan, Nardil, and Parnate  reserpine  tetrabenazine This medicine may also interact with the following medications:  alcohol  droperidol  entacapone  iron supplements or multivitamins with iron  isoniazid, INH  linezolid  medicines for depression, anxiety, or psychotic disturbances  medicines for high blood pressure  medicines for sleep  metoclopramide  papaverine  procarbazine  tedizolid  rasagiline  selegiline  tolcapone This list may not describe all possible interactions. Give your health care provider a list of all the medicines, herbs, non-prescription drugs, or dietary supplements you use. Also tell them if you smoke, drink alcohol, or use illegal drugs. Some items may interact with your medicine. What should I watch for while using this medicine? Visit your doctor or health care professional for regular checks on your progress. It may be several weeks or months before you feel the full benefits of this medicine. Continue to take your medicine on a regular schedule. Do not take any additional medicines for Parkinson's disease without first consulting with your health care provider. You may experience a wearing of effect prior to the time for your next dose of this medicine. You may also experience an on-off effect where the medicine apparently stops working for anything from a minute to several hours, then suddenly starts working again. Tell your doctor or health care professional if any of these symptoms happen to you. Your dose may need to be  changed. A high protein diet can slow or prevent absorption of this medicine. Avoid high protein foods near the time of taking this medicine to help to prevent these problems. Take this medicine at least 30 minutes before eating or one hour after meals. You may want to eat higher protein foods later in the day or in small amounts. Discuss your diet with your doctor or health care professional or nutritionist. You may get drowsy or dizzy. Do not drive, use machinery, or do anything that needs mental alertness until you know how this drug affects you. Do not stand or sit up quickly, especially if you are an older patient. This reduces the risk of dizzy or fainting spells. Alcohol can make you more drowsy and dizzy. Avoid alcoholic drinks. If you find that you have sudden feelings of wanting to sleep during normal activities, like cooking, watching television, or while driving or riding in a car, you should contact your health care professional. If you are diabetic, this  medicine may interfere with the accuracy of some tests for sugar or ketones in the urine (does not interfere with blood tests). Check with your doctor or health care professional before changing the dose of your diabetic medicine. This medicine may discolor the urine or sweat, making it look darker or red in color. This is of no cause for concern. However, this may stain clothing or fabrics. There have been reports of increased sexual urges or other strong urges such as gambling while taking some medicines for Parkinson's disease. If you experience any of these urges while taking this medicine, you should report it to your health care provider as soon as possible. You should check your skin often for changes to moles and new growths while taking this medicine. Call your doctor if you notice any of these changes. This medicine may cause a decrease in vitamin B6. You should make sure that you get enough vitamin B6 while you are taking this  medicine. Discuss the foods you eat and the vitamins you take with your health care professional. What side effects may I notice from receiving this medicine? Side effects that you should report to your doctor or health care professional as soon as possible:  allergic reactions like skin rash, itching or hives, swelling of the face, lips, or tongue  anxiety, confusion, or nervousness  falling asleep during normal activities like driving  fast, irregular heartbeat  hallucination, loss of contact with reality  mood changes like aggressive behavior, depression  stomach pain  trouble passing urine  uncontrolled movements of the mouth, head, hands, feet, shoulders, eyelids or other unusual muscle movements Side effects that usually do not require medical attention (report to your doctor or health care professional if they continue or are bothersome):  headache  loss of appetite  muscle twitches  nausea, vomiting  nightmares, trouble sleeping  unusually weak or tired This list may not describe all possible side effects. Call your doctor for medical advice about side effects. You may report side effects to FDA at 1-800-FDA-1088. Where should I keep my medicine? Keep out of the reach of children. Store at room temperature between 15 and 30 degrees C (59 and 86 degrees F). Protect from light. Throw away any unused medicine after the expiration date. NOTE: This sheet is a summary. It may not cover all possible information. If you have questions about this medicine, talk to your doctor, pharmacist, or health care provider.  2020 Elsevier/Gold Standard (2016-09-21 08:21:48)

## 2019-01-27 ENCOUNTER — Other Ambulatory Visit: Payer: Self-pay

## 2019-01-27 ENCOUNTER — Ambulatory Visit (INDEPENDENT_AMBULATORY_CARE_PROVIDER_SITE_OTHER): Payer: Medicare Other | Admitting: Family Medicine

## 2019-01-27 ENCOUNTER — Encounter: Payer: Self-pay | Admitting: Family Medicine

## 2019-01-27 VITALS — BP 92/58 | HR 91 | Wt 197.6 lb

## 2019-01-27 DIAGNOSIS — N182 Chronic kidney disease, stage 2 (mild): Secondary | ICD-10-CM

## 2019-01-27 DIAGNOSIS — E118 Type 2 diabetes mellitus with unspecified complications: Secondary | ICD-10-CM | POA: Diagnosis not present

## 2019-01-27 DIAGNOSIS — G8929 Other chronic pain: Secondary | ICD-10-CM

## 2019-01-27 DIAGNOSIS — M5442 Lumbago with sciatica, left side: Secondary | ICD-10-CM | POA: Diagnosis not present

## 2019-01-27 DIAGNOSIS — M5441 Lumbago with sciatica, right side: Secondary | ICD-10-CM

## 2019-01-27 DIAGNOSIS — I10 Essential (primary) hypertension: Secondary | ICD-10-CM

## 2019-01-27 LAB — POCT GLYCOSYLATED HEMOGLOBIN (HGB A1C): Hemoglobin A1C: 6.8 % — AB (ref 4.0–5.6)

## 2019-01-27 NOTE — Patient Instructions (Addendum)
It was nice seeing you today Tyler Lester!  Today, we are checking some labs including one that checks for diabetes, and I will let you know what those show when they come back.  Please call and make sure that your MRI of your neck gets done.  Please call your neurologist office if they have not called you about your MRI soon.  You can take up to 3 g of Tylenol per day for your pain.  I am also giving you some neck stretches to practice.  Please try doing 3 sets of 10 repetitions every day.  Please also continue using the heat pad that you have been using.  If you have any questions or concerns, please feel free to call the clinic.   Be well,  Tyler Lester  Cervical Strain and Sprain Rehab Ask your health care provider which exercises are safe for you. Do exercises exactly as told by your health care provider and adjust them as directed. It is normal to feel mild stretching, pulling, tightness, or discomfort as you do these exercises. Stop right away if you feel sudden pain or your pain gets worse. Do not begin these exercises until told by your health care provider. Stretching and range-of-motion exercises Cervical side bending  1. Using good posture, sit on a stable chair or stand up. 2. Without moving your shoulders, slowly tilt your left / right ear to your shoulder until you feel a stretch in the opposite side neck muscles. You should be looking straight ahead. 3. Hold for __________ seconds. 4. Repeat with the other side of your neck. Repeat __________ times. Complete this exercise __________ times a day. Cervical rotation  1. Using good posture, sit on a stable chair or stand up. 2. Slowly turn your head to the side as if you are looking over your left / right shoulder. ? Keep your eyes level with the ground. ? Stop when you feel a stretch along the side and the back of your neck. 3. Hold for __________ seconds. 4. Repeat this by turning to your other side. Repeat __________ times.  Complete this exercise __________ times a day. Thoracic extension and pectoral stretch 1. Roll a towel or a small blanket so it is about 4 inches (10 cm) in diameter. 2. Lie down on your back on a firm surface. 3. Put the towel lengthwise, under your spine in the middle of your back. It should not be under your shoulder blades. The towel should line up with your spine from your middle back to your lower back. 4. Put your hands behind your head and let your elbows fall out to your sides. 5. Hold for __________ seconds. Repeat __________ times. Complete this exercise __________ times a day. Strengthening exercises Isometric upper cervical flexion 1. Lie on your back with a thin pillow behind your head and a small rolled-up towel under your neck. 2. Gently tuck your chin toward your chest and nod your head down to look toward your feet. Do not lift your head off the pillow. 3. Hold for __________ seconds. 4. Release the tension slowly. Relax your neck muscles completely before you repeat this exercise. Repeat __________ times. Complete this exercise __________ times a day. Isometric cervical extension  1. Stand about 6 inches (15 cm) away from a wall, with your back facing the wall. 2. Place a soft object, about 6-8 inches (15-20 cm) in diameter, between the back of your head and the wall. A soft object could be a  small pillow, a ball, or a folded towel. 3. Gently tilt your head back and press into the soft object. Keep your jaw and forehead relaxed. 4. Hold for __________ seconds. 5. Release the tension slowly. Relax your neck muscles completely before you repeat this exercise. Repeat __________ times. Complete this exercise __________ times a day. Posture and body mechanics Body mechanics refers to the movements and positions of your body while you do your daily activities. Posture is part of body mechanics. Good posture and healthy body mechanics can help to relieve stress in your body's  tissues and joints. Good posture means that your spine is in its natural S-curve position (your spine is neutral), your shoulders are pulled back slightly, and your head is not tipped forward. The following are general guidelines for applying improved posture and body mechanics to your everyday activities. Sitting  1. When sitting, keep your spine neutral and keep your feet flat on the floor. Use a footrest, if necessary, and keep your thighs parallel to the floor. Avoid rounding your shoulders, and avoid tilting your head forward. 2. When working at a desk or a computer, keep your desk at a height where your hands are slightly lower than your elbows. Slide your chair under your desk so you are close enough to maintain good posture. 3. When working at a computer, place your monitor at a height where you are looking straight ahead and you do not have to tilt your head forward or downward to look at the screen. Standing   When standing, keep your spine neutral and keep your feet about hip-width apart. Keep a slight bend in your knees. Your ears, shoulders, and hips should line up.  When you do a task in which you stand in one place for a long time, place one foot up on a stable object that is 2-4 inches (5-10 cm) high, such as a footstool. This helps keep your spine neutral. Resting When lying down and resting, avoid positions that are most painful for you. Try to support your neck in a neutral position. You can use a contour pillow or a small rolled-up towel. Your pillow should support your neck but not push on it. This information is not intended to replace advice given to you by your health care provider. Make sure you discuss any questions you have with your health care provider. Document Released: 02/12/2005 Document Revised: 06/04/2018 Document Reviewed: 11/13/2017 Elsevier Patient Education  2020 [employer].

## 2019-01-27 NOTE — Progress Notes (Signed)
Subjective:    Tyler Lester - 73 y.o. male MRN [id number]  Date of birth: 25-Feb-1946  CC:  Tyler Lester is here for neck pain.  HPI: Neck pain Located around the left side of his neck and radiates to his bilateral arms, has numbness and tingling there as well Using heat pads seems to help Feels like his pain started about two weeks ago, although he says he has had pain in his neck both before and after surgery Had surgery about two years ago for pinched nerves  Was told by a doctor that his "blood was low" and that he had diabetes.  Last recorded labs were in January.   Health Maintenance:  Health Maintenance Due  Topic Date Due  . COLONOSCOPY  07/31/1997  . FOOT EXAM  03/27/2017  . OPHTHALMOLOGY EXAM  05/24/2017    -  reports that he has never smoked. He has never used smokeless tobacco. - Review of Systems: Per HPI. - Past Medical History: Patient Active Problem List   Diagnosis Date Noted  . Palpitations 11/24/2018  . Onychomycosis 08/05/2017  . Care plan discussed with patient 06/27/2017  . Chronic bilateral low back pain with bilateral sciatica 01/30/2017  . Memory change 11/09/2016  . Carpal tunnel syndrome of left wrist 11/06/2016  . Cubital tunnel syndrome on left 11/06/2016  . Hyperlipidemia 11/05/2016  . Enrolled in chronic care management 10/02/2016  . PAD (peripheral artery disease) ([place]) 07/28/2016  . Leg swelling 07/27/2016  . Routine adult health maintenance 06/18/2016  . Diabetic polyneuropathy associated with type 2 diabetes mellitus ([place]) 04/13/2016  . 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 Diminished hearing, left 01/20/2016  . Type 2 diabetes mellitus with complication ([place]) [id number]  . Chronic back pain 09/16/2015  . Parkinson disease ([place]) 08/26/2015  . Rhinitis, allergic 08/25/2015  . Gait abnormality 03/28/2015  . Left arm swelling 11/11/2014  . Postlaminectomy syndrome, cervical region 07/23/2014  . Parkinson's  disease ([place]) 07/23/2014  . Back pain with sciatica 05/27/2014  . Bilateral leg pain 05/14/2014  . Hearing loss of left ear due to cerumen impaction 04/27/2014  . Depression 10/15/2013  . CKD Stage 2 08/25/2013  . Encounter for chronic pain management 07/13/2013  . Tremor 05/23/2013  . Chronic diastolic heart failure ([place]) 05/06/2013  . Knee pain, bilateral 12/12/2012  . Overgrown toenails 05/14/2012  . GERD (gastroesophageal reflux disease) 07/04/2011  . Skin lesion of lower extremity 01/26/2011  . Essential hypertension 01/05/2011   - Medications: reviewed and updated   Objective:   Physical Exam BP (!) 92/58   Pulse 91   Wt 197 lb 9.6 oz (89.6 kg)   SpO2 99%   BMI 29.18 kg/m  Gen: NAD, alert, cooperative with exam, well-appearing, pleasant CV: RRR, good S1/S2, no murmur, no edema  Resp: CTABL, no wheezes, non-labored Musculoskeletal: Scar visualized on cervical region of the spine from previous surgery, mildly tender to palpation along the paraspinal muscles in the cervical area, decreased ROM in flexion, extension, rotation, and lateral extension of cervical spine.  4/5 grip strength, decreased sensation on the right hand compared to the left Neuro: dyskinesia apparent on exam Psych: good insight, alert and oriented        Assessment & Plan:   Essential hypertension Hypotensive today and especially hypotensive at his last appointment in our clinic.  Advised patient to take half of his lisinopril (reducing the dose to 10 mg) and return in 1 week for follow-up.  Obtained  CMP and CBC today.  Type 2 diabetes mellitus with complication (HCC) Historically well controlled, will obtain hemoglobin A1c today.  Chronic back pain It appears that neurology is planning to schedule an MRI of the cervical spine after seeing him yesterday, 11/30.  We will follow-up on this MRI.  In the meantime, I counseled patient to take Tylenol for pain relief, continue that heat pads, and a  handout with neck stretches was given to the patient.  Maia Breslow, M.D. 01/28/2019, 11:29 AM PGY-3, [place]

## 2019-01-28 ENCOUNTER — Telehealth: Payer: Self-pay | Admitting: Family Medicine

## 2019-01-28 LAB — COMPREHENSIVE METABOLIC PANEL
ALT: 4 IU/L (ref 0–44)
AST: 13 IU/L (ref 0–40)
Albumin/Globulin Ratio: 1.3 (ref 1.2–2.2)
Albumin: 4 g/dL (ref 3.7–4.7)
Alkaline Phosphatase: 63 IU/L (ref 39–117)
BUN/Creatinine Ratio: 17 (ref 10–24)
BUN: 42 mg/dL — ABNORMAL HIGH (ref 8–27)
Bilirubin Total: <0.2 mg/dL (ref 0.0–1.2)
CO2: 20 mmol/L (ref 20–29)
Calcium: 9.3 mg/dL (ref 8.6–10.2)
Chloride: 106 mmol/L (ref 96–106)
Creatinine, Ser: 2.44 mg/dL — ABNORMAL HIGH (ref 0.76–1.27)
GFR calc Af Amer: 30 mL/min/{1.73_m2} — ABNORMAL LOW (ref >59)
GFR calc non Af Amer: 26 mL/min/{1.73_m2} — ABNORMAL LOW (ref >59)
Globulin, Total: 3.2 g/dL (ref 1.5–4.5)
Glucose: 112 mg/dL — ABNORMAL HIGH (ref 65–99)
Potassium: 5.2 mmol/L (ref 3.5–5.2)
Sodium: 140 mmol/L (ref 134–144)
Total Protein: 7.2 g/dL (ref 6.0–8.5)

## 2019-01-28 LAB — CBC
Hematocrit: 40 % (ref 37.5–51.0)
Hemoglobin: 13.6 g/dL (ref 13.0–17.7)
MCH: 28.5 pg (ref 26.6–33.0)
MCHC: 34 g/dL (ref 31.5–35.7)
MCV: 84 fL (ref 79–97)
Platelets: 224 10*3/uL (ref 150–450)
RBC: 4.78 x10E6/uL (ref 4.14–5.80)
RDW: 12.7 % (ref 11.6–15.4)
WBC: 6.7 10*3/uL (ref 3.4–10.8)

## 2019-01-28 NOTE — Assessment & Plan Note (Addendum)
Hypotensive today and especially hypotensive at his last appointment in our clinic.  Advised patient to take half of his lisinopril (reducing the dose to 10 mg) and return in 1 week for follow-up.  Obtained CMP and CBC today.

## 2019-01-28 NOTE — Assessment & Plan Note (Signed)
It appears that neurology is planning to schedule an MRI of the cervical spine after seeing him yesterday, 11/30.  We will follow-up on this MRI.  In the meantime, I counseled patient to take Tylenol for pain relief, continue that heat pads, and a handout with neck stretches was given to the patient.

## 2019-01-28 NOTE — Telephone Encounter (Signed)
Called Mr. Leeds to let him know that his A1c is slightly elevated to 6.8, but since he continues to have very good control, we do not need to start Metformin, although we can certainly discuss this when we see him next.  Also, he should make an appointment to see someone in our clinic in about 1 week to follow-up on his blood pressure after we have reduced his lisinopril dose.  We also need to recheck a BMP at that time since his creatinine was elevated on 12/1.  He asked me to call his caregiver, Fransico Him, to have her make the appointment.  I called both numbers listed for Ms. Ford but there was no answer and I cannot leave a voicemail.  I called the patient back and told him this, and he will ask Ms. Ford to call to make the appointment.

## 2019-01-28 NOTE — Assessment & Plan Note (Signed)
Historically well controlled, will obtain hemoglobin A1c today.

## 2019-02-05 ENCOUNTER — Ambulatory Visit (INDEPENDENT_AMBULATORY_CARE_PROVIDER_SITE_OTHER): Payer: Medicare Other | Admitting: Family Medicine

## 2019-02-05 ENCOUNTER — Other Ambulatory Visit: Payer: Self-pay

## 2019-02-05 ENCOUNTER — Encounter: Payer: Self-pay | Admitting: Family Medicine

## 2019-02-05 VITALS — BP 85/55 | HR 99 | Temp 98.7°F | Wt 193.0 lb

## 2019-02-05 DIAGNOSIS — I10 Essential (primary) hypertension: Secondary | ICD-10-CM | POA: Diagnosis not present

## 2019-02-05 DIAGNOSIS — N1831 Chronic kidney disease, stage 3a: Secondary | ICD-10-CM

## 2019-02-05 DIAGNOSIS — I5032 Chronic diastolic (congestive) heart failure: Secondary | ICD-10-CM

## 2019-02-05 DIAGNOSIS — I950 Idiopathic hypotension: Secondary | ICD-10-CM | POA: Diagnosis not present

## 2019-02-05 NOTE — Patient Instructions (Addendum)
Dear Tyler Lester,   It was good to see you! Thank you for taking your time to come in to be seen. Today, we discussed the following:   Low blood pressure  He will have any obvious changes in medication or medical history that would change her blood pressure and make it lower. This might be due to progression of your Parkinson's disease.  Please stop taking the lisinopril completely  You will see medication changes at the end of this packet.  Stop naproxen, no NSAID, ibuprofen   Please follow up in 1 week for repeat labs and further follow up  Be well,   Zettie Cooley, M.D   [place] [phone]  *Sign up for MyChart for instant access to your health profile, labs, orders, upcoming appointments or to contact your provider with questions*  ===================================================================================

## 2019-02-05 NOTE — Progress Notes (Signed)
Subjective  CC: No chief complaint on file.  [id number] Tyler Lester is a 73 y.o. male who presents today with the following problems:  Hypertension with hypotension  Patient was seen in clinic last week by Dr. Shan Levans and was noted to have low blood pressure at 92/58 with a pulse of 91. His BP today is 85/55 and HR 96. He is not on any new medications at this time. A thorough review of systems was obtained given ambiguity of causes of hypotension.   Pertinent PM/FHx: Parkinson's disease, Diastolic HF, HTN, GERD, Chronic low back pain.  Social Hx: Never smoker. Denies ETOH & drug use.  ROS:  Review of Systems  Constitutional: Negative for activity change, appetite change, diaphoresis, fatigue, fever and unexpected weight change.  HENT: Negative for dental problem, drooling, facial swelling, sinus pain, sneezing, sore throat and trouble swallowing.   Eyes: Negative for visual disturbance.  Respiratory: Negative for chest tightness, shortness of breath and wheezing.   Cardiovascular: Negative for chest pain, palpitations and leg swelling.  Gastrointestinal: Negative for abdominal distention, abdominal pain, constipation, diarrhea, nausea and vomiting.  Endocrine: Positive for polyuria. Negative for cold intolerance and heat intolerance.  Genitourinary: Negative for difficulty urinating, frequency and urgency.  Musculoskeletal: Positive for back pain and neck pain.  Allergic/Immunologic: Negative for environmental allergies and food allergies.  Neurological: Negative for dizziness, seizures, speech difficulty and headaches. Denies weakness, numbness.  Hematological: Negative for adenopathy. Does not bruise/bleed easily.  Psychiatric/Behavioral: Negative for sleep disturbance.   Objective  Physical Exam:  BP (!) 85/55   Pulse 99   Temp 98.7 F (37.1 C) (Oral)   Wt 193 lb (87.5 kg)   SpO2 96%   BMI 28.50 kg/m  General: Well appearing male. NAD  Chest: RRR. 1+ BLEE to ankles.    Lungs: CTAB Neuro: intention tremors appreciated. No cogwheeling.  Psych: Flat affect.    Pertinent Labs:   Assessment & Plan    Problem List Items Addressed This Visit      Active Problems   Hypotension    Performed at thorough review of systems with patient given the large ddx of hypotension.  Patient denies any new medications or changes in dosing of medications recently.  He is currently on gabapentin, Percocet for chronic pain but reports that he has been taking these medications for quite some time, so unlikely to be making any changes in his blood pressure now.  Patient reports that he drinks plenty of fluids during the day.  He denies any weakness, dizziness, falls.  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  We will stop all antihypertensive agents and continue to watch.  Patient's other vitals are within normal limits.  Stop naproxen, no NSAID, ibuprofen   Please follow up in 1 week for repeat labs and further follow up       Essential hypertension    Recently hypotensive.  Patient's lisinopril was decreased to 10 mg last week on 12 1 by Dr. Shan Levans.  He is not taking any other antihypertensives.      Relevant Orders   Basic Metabolic Panel (Completed)   Chronic diastolic heart failure (HCC)    Other Visit Diagnoses    Stage 3a chronic kidney disease    -  Primary   Relevant Orders   Vitamin D 25-hydroxy (Completed)   Phosphorus (Completed)      able.   Wilber Oliphant, M.D.  12:14 PM  02/09/2019 

## 2019-02-05 NOTE — Assessment & Plan Note (Signed)
Recently hypotensive.  Patient's lisinopril was decreased to 10 mg last week on 12 1 by Dr. Shan Levans.  He is not taking any other antihypertensives.

## 2019-02-06 LAB — BASIC METABOLIC PANEL
BUN/Creatinine Ratio: 18 (ref 10–24)
BUN: 37 mg/dL — ABNORMAL HIGH (ref 8–27)
CO2: 21 mmol/L (ref 20–29)
Calcium: 9.9 mg/dL (ref 8.6–10.2)
Chloride: 106 mmol/L (ref 96–106)
Creatinine, Ser: 2.08 mg/dL — ABNORMAL HIGH (ref 0.76–1.27)
GFR calc Af Amer: 36 mL/min/{1.73_m2} — ABNORMAL LOW (ref >59)
GFR calc non Af Amer: 31 mL/min/{1.73_m2} — ABNORMAL LOW (ref >59)
Glucose: 104 mg/dL — ABNORMAL HIGH (ref 65–99)
Potassium: 4.4 mmol/L (ref 3.5–5.2)
Sodium: 141 mmol/L (ref 134–144)

## 2019-02-06 LAB — PHOSPHORUS: Phosphorus: 4 mg/dL (ref 2.8–4.1)

## 2019-02-06 LAB — VITAMIN D 25 HYDROXY (VIT D DEFICIENCY, FRACTURES): Vit D, 25-Hydroxy: 17.1 ng/mL — ABNORMAL LOW (ref 30.0–100.0)

## 2019-02-09 ENCOUNTER — Encounter: Payer: Self-pay | Admitting: Family Medicine

## 2019-02-09 DIAGNOSIS — I959 Hypotension, unspecified: Secondary | ICD-10-CM | POA: Insufficient documentation

## 2019-02-09 NOTE — Assessment & Plan Note (Addendum)
Performed at thorough review of systems with patient given the large ddx of hypotension.  Patient denies any new medications or changes in dosing of medications recently.  He is currently on gabapentin, Percocet for chronic pain but reports that he has been taking these medications for quite some time, so unlikely to be making any changes in his blood pressure now.  Patient reports that he drinks plenty of fluids during the day.  He denies any weakness, dizziness, falls.  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  We will stop all antihypertensive agents and continue to watch.  Patient's other vitals are within normal limits.  Stop naproxen, no NSAID, ibuprofen   Please follow up in 1 week for repeat labs and further follow up

## 2019-02-10 ENCOUNTER — Encounter: Payer: Self-pay | Admitting: Family Medicine

## 2019-02-13 ENCOUNTER — Other Ambulatory Visit: Payer: Medicare Other

## 2019-03-11 ENCOUNTER — Other Ambulatory Visit: Payer: Medicare Other

## 2019-03-19 ENCOUNTER — Telehealth: Payer: Self-pay

## 2019-03-19 NOTE — Telephone Encounter (Signed)
Patient wife called to get updated information on where the patient was referred to for physical therapy. Wife states they have not heard anything about the patient being scheduled  Please follow up

## 2019-03-23 ENCOUNTER — Ambulatory Visit: Payer: Medicare Other | Admitting: Neurology

## 2019-03-23 NOTE — Progress Notes (Deleted)
PATIENT: Tyler Lester DOB: May 09, 1945  Interval history 01/26/2019: Patient appears to be having dyskinesias. Difficult to address as he is a poor historian as is his caretaker, we have asked in the past for more details on his medication, turning on and turning off, times of his medication. I had a long discussion, will need more information on when he takes his medication and when they notice the dyskinesias. At this time will decrease his medication and see if the dyskinesias improve, may consider Rytary or Amantadine.  It just started 7 days ago. He takes his medication at 11am, 2pm and 5pm. He takes 2 pills each time. She comes in at 9am, he last took his medication at 8am he is not sure so 3.5 hours ago. He never had PT. We will send PT to his home, they have not paid attention on the medication when it starts working or when the dyskinesias occr, when the medication wears off.   REASON FOR VISIT: follow up HISTORY FROM: patient  No chief complaint on file.    HISTORY OF PRESENT ILLNESS: Today 03/23/19 Tyler Lester is a 74 y.o. male here today for follow up for Parkinson's Disease and worsening tremor. He was seen in 04/2018 and Simemet was increased to 2 tablets four times daily. He states that he has not increased medication. He continues Sinemet 2 tablets at 8am, 12p and 2p. He has noticed worsening tremor. He is having a difficult time with using restroom. Tremor is noted at rest and worse with activity. He feels that he is slower with movements. He is using single point cane today but also uses Rolator at home. He was also referred to PT but did not go. He was referred to PT recently by PCP for hip pain. He has a caregiver with him today that reports other than tremor he seems to be doing well. No falls. He feels memory is stable on Aricept. He has a caregiver from 8a-5p. He lives alone. He has community support and friends that check on him in the evenings. He does not drive.     HISTORY: (copied from  note on 05/14/2018)  Tyler Lester is a 73 y.o. male here today for follow up Parkinson's Disease.  He reports doing fairly well with the exception of increased tremor in the left hand.  He feels that the tremor is worse in the mornings.  This tremor does improve after taking Sinemet.  He is also noticed some change in his gait.  He feels that he is not able to lift his right leg normally.  He denies any falls.  He does use a cane or walker with long distance walking.  He denies any dizziness or lightheadedness.  No swallowing difficulties no drooling.  He feels that his memory is stable.  He is taking Aricept 10 mg daily as prescribed.  Sinemet 25/100 mg 2 capsules 3 times a day.  He reports dosing is typically at 8 AM, 12 PM and 6 PM.  HISTORY: (copied from Dr Cathren Laine note on 11/27/2017)  Interval history 11/27/2017: Patient is here for Parkinson's Disease. Unfortunately in the past he has been non compliant with medication and management plan and also refused help such as [facility] to help with his medication management. He did not come back because I discussed I couldn't help him. Today he returns and wonderfully he had a nurse/caregiver with him who is there at his home and helps with medication management.  No falls in the home. Using a walker. No swallowing difficulties. No hallucinations or elusions. No drooling. No dizziness, not lightheaded, good appetite. He has a tremor, Sinemet helps when he takes. He denies rem sleep disorder. Sleeping well. During the day awake and alert.  HPI: Tyler Lester is a 74y.o. male here as a follow up for Parkinsonism, memory loss, gait ataxia, non-compliance. Memory problems started years ago. The tremor is more at rest. And the jaw trembles as well. Feels like his voice is getting softer. He has vivid dreams. He has drool on his pillow. He has fallen a few times and he catches himslef. He has loss of smell and taste. He can't smell  dinner. He is swallowing OK. His handwriting has gotten smaller, when he writes memos he has noticed he has gotten smaller.   Interval history 02/15/2015:He does not know what medications he is taking. He has refused [facility] or home health care to help him with medications. He is a poor historian. I have asked him multiple times to bring in his medication bottles and he always forgets. i had a frank conversation with patient, I cannot manage his parkinsonism in this manner. He is supposed to be on Sinemet and Azilect. Called the pharmacy and he has not filled [place] since July. Sinemet was last filled on 12/5 but before then he filled it in September for a month. Not compliant.  Interval history 11/17/2014: He is having neck pain. He was scheduled today to discuss memory loss and he was supposed to bring a family member but he forgot. He would like pain medication and gets Vicadin but Dr. Lonny Prude won't give hm anymore because he had alcohol in his urine. He is not wearing his neupro patch today. I am not sure he is taking his medications. He declines any services to help him in the house. He has not started the Lyrica. He never got the MRI of the brain completed that I ordered last December. He says the neupro patch doesn't work and gives him a lot of urges to have sex.  Interval History 03/22/2014: the tremor continues. He appears to be a poor historian and doesn't quite know what medications he is taking. He is not having any side effects from the Azilect and says he believes he is taking it. He also has neck pain and low back pain. He is on pain medication and is waiting to be seen by pain management. He had recent cervical spine surgery and has had lumber spine surgery in the past. He is having shooting pain from low back into right leg that is chronic and he is being managed by orthopaedic doctor. The orthopedist cancelled his pain medication because he was already getting vicodin from dr. Teryl Lucy and  patient wants to know if I can give him the percocet instead of vicodin. He is only taking 200mg  three times daily (at first he said just once daily) of neurontin. No side effects from the gabapentin.   02/03/2014: Tyler Lester is a 74 y.o. male here as a referral from Dr. Lonny Prude for Tremor. PMHx cervical fusion (September), chronic back pain, DM, HTN, fatigue, depression, anxiety, myelopathy.   Was sent here by Dr. Teryl Lucy for evaluation of tremor. He has a tremor in his left hand and his jaw. Tremor has only been going on for at least a year but worse in the last 3 months. Getting worse after neck surgery. Worse in the left but also in the  right hand as well as the chin. He can't walk, he has chronic pain in his low back and neck. Walking slow, doesn't have energy. No tremors in other family members. Doesn't notice the tremor getting better with alcohol. No constipation. Can hardly smell. Sometimes he is talking low, softer. Hand writing is shaky, smaller. Feels weak. TSH was normal in June. Does not drink caffeine. 1-2 drinks a week, no significant alcohol. No FHx of parkinson's or neurodegenerative disease. Has had falls.   REVIEW OF SYSTEMS: Out of a complete 14 system review of symptoms, the patient complains only of the following symptoms, eye redness, leg swelling, tremors, neck pain, neck stiffness, and all other reviewed systems are negative.  ALLERGIES: Allergies  Allergen Reactions  . Poison Ivy Treatments Hives    Pt denies allergy to this medication    HOME MEDICATIONS: Outpatient Medications Prior to Visit  Medication Sig Dispense Refill  . carbidopa-levodopa (SINEMET) 25-100 MG tablet Take 1-1.5 tablets by mouth 4 (four) times daily. Take 3-4 hours apart such at 8am, 11-12am, 2-4pm and 6-8pm for example. Avoid high protein diet. 540 tablet 1  . Cholecalciferol (VITAMIN D) 2000 units CAPS Take 1 capsule (2,000 Units total) by mouth daily. 90 capsule 11  . donepezil (ARICEPT)  10 MG tablet Take 1 Tablet by mouth every day 30 tablet 11  . fluticasone (FLONASE) 50 MCG/ACT nasal spray Place 2 sprays into both nostrils daily. 16 g 11  . gabapentin (NEURONTIN) 100 MG capsule TAKE 2 CAPSULES (200 MG TOTAL) BY MOUTH 3 (THREE) TIMES DAILY. 180 capsule 1  . Multiple Vitamins-Minerals (MULTIVITAMIN MEN 50+) TABS Take 1 tablet by mouth daily. 90 tablet 11  . naloxone (NARCAN) 2 MG/2ML injection Inject 0.4 mLs (0.4 mg total) into the muscle as needed (opiod overdose. May repeat in two to three minutes.). (Patient not taking: Reported on 01/26/2019) 2 mL 1  . omeprazole (PRILOSEC) 20 MG capsule TAKE 1 CAPSULE BY MOUTH EVERY DAY 90 capsule 3  . oxyCODONE-acetaminophen (PERCOCET) 7.5-325 MG tablet TK 1 T PO Q 6 H PRN FOR PAIN  0   Facility-Administered Medications Prior to Visit  Medication Dose Route Frequency Provider Last Rate Last Admin  . magnesium citrate solution 1 Bottle  1 Bottle Oral Once Alexis Frock, MD        PAST MEDICAL HISTORY: Past Medical History:  Diagnosis Date  . Bilateral foot pain   . Chronic pain   . Diabetes mellitus    type 2, diet controlled now  . Foley catheter in place since nov 2018, last changed 1 week ago  . Hyperlipidemia   . Hypertension   . Inguinal hernia    right  . Numbness    T/O  . Parkinson's disease ([place])   . Pneumonia yrs ago  . Shortness of breath    with exertion  . Urinary retention     PAST SURGICAL HISTORY: Past Surgical History:  Procedure Laterality Date  . CATARACT EXTRACTION W/ INTRAOCULAR LENS IMPLANT Left 06/2012   laser for posterior capsule?  . COLON SURGERY    . POSTERIOR CERVICAL FUSION/FORAMINOTOMY N/A 11/12/2013   Procedure: Posterior cervical decompression fusion, cervical 3-4, cervical 4-5, cervical 5-6, cervical 6-7 with instrumentation and allograft   (LEVEL 4);  Surgeon: Sinclair Ship, MD;  Location: [facility];  Service: Orthopedics;  Laterality: N/A;  Posterior cervical decompression fusion,  cervical 3-4, cervical 4-5, cervical 5-6, cervical 6-7 with instrumentation and allograft  . TONSILLECTOMY    .  XI ROBOTIC ASSISTED SIMPLE PROSTATECTOMY N/A 03/07/2016   Procedure: XI ROBOTIC ASSISTED SIMPLE PROSTATECTOMY AND UMBILICAL HERNIA REPAIR flexible cystoscopy;  Surgeon: Alexis Frock, MD;  Location: [facility];  Service: Urology;  Laterality: N/A;    FAMILY HISTORY: Family History  Problem Relation Age of Onset  . Cancer - Other Other   . Diabetes Other   . Hypertension Other     SOCIAL HISTORY: Social History   Socioeconomic History  . Marital status: Single    Spouse name: Not on file  . Number of children: 2  . Years of education: 70  . Highest education level: Not on file  Occupational History  . Occupation: Retired     Comment: Event organiser at [employer]  . Smoking status: Never Smoker  . Smokeless tobacco: Never Used  Substance and Sexual Activity  . Alcohol use: Yes    Alcohol/week: 0.0 standard drinks    Comment: 1 shot a day  . Drug use: No  . Sexual activity: Not on file  Other Topics Concern  . Not on file  Social History Narrative   Patient lives at home alone.  He has a caregiver from 9 AM- 5 PM Monday through Sunday.   Patient have 2 children.    Patient has a high school education    Patient is right handed.    Patient is retired on disability.    Social Determinants of Health   Financial Resource Strain:   . Difficulty of Paying Living Expenses: Not on file  Food Insecurity:   . Worried About Charity fundraiser in the Last Year: Not on file  . Ran Out of Food in the Last Year: Not on file  Transportation Needs:   . Lack of Transportation (Medical): Not on file  . Lack of Transportation (Non-Medical): Not on file  Physical Activity:   . Days of Exercise per Week: Not on file  . Minutes of Exercise per Session: Not on file  Stress:   . Feeling of Stress : Not on file  Social Connections:   . Frequency of Communication with  Friends and Family: Not on file  . Frequency of Social Gatherings with Friends and Family: Not on file  . Attends Religious Services: Not on file  . Active Member of Clubs or Organizations: Not on file  . Attends Archivist Meetings: Not on file  . Marital Status: Not on file  Intimate Partner Violence:   . Fear of Current or Ex-Partner: Not on file  . Emotionally Abused: Not on file  . Physically Abused: Not on file  . Sexually Abused: Not on file      PHYSICAL EXAM  There were no vitals filed for this visit. There is no height or weight on file to calculate BMI.  Generalized: Well developed, flat affect, in no acute distress  Cardiology: normal rate and rhythm, no murmur noted Neurological examination  Mentation: Alert oriented to time, place, history taking. Follows all commands speech and language fluent Cranial nerve II-XII: Pupils were equal round reactive to light. Extraocular movements were full, visual field were full on confrontational test. Facial sensation and strength were normal. Uvula tongue midline. Head turning and shoulder shrug  were normal and symmetric. Motor: The motor testing reveals 5 over 5 strength of all 4 extremities. Good symmetric motor tone is noted throughout. Bradykinesia noted with finger taps and toe taps, no cogwheel rigidity  Sensory: Sensory testing is intact to soft touch  on all 4 extremities. No evidence of extinction is noted.  Coordination: Cerebellar testing reveals slow but good finger-nose-finger and heel-to-shin bilaterally.  Gait and station: Gait is shuffeled, uses single point cane, wide turns, spastic  DIAGNOSTIC DATA (LABS, IMAGING, TESTING) - I reviewed patient records, labs, notes, testing and imaging myself where available.  MMSE - Mini Mental State Exam 10/22/2018 05/14/2018  Not completed: (No Data) (No Data)  Orientation to time 5 4  Orientation to Place 3 5  Registration 3 3  Attention/ Calculation 0 0  Recall 3  2  Language- name 2 objects 2 2  Language- repeat 1 1  Language- follow 3 step command 3 3  Language- read & follow direction 1 1  Write a sentence 1 0  Copy design 1 1  Total score 23 22     Lab Results  Component Value Date   WBC 6.7 01/27/2019   HGB 13.6 01/27/2019   HCT 40.0 01/27/2019   MCV 84 01/27/2019   PLT 224 01/27/2019      Component Value Date/Time   NA 141 02/05/2019 1547   K 4.4 02/05/2019 1547   CL 106 02/05/2019 1547   CO2 21 02/05/2019 1547   GLUCOSE 104 (H) 02/05/2019 1547   GLUCOSE 172 (H) 03/08/2016 0545   BUN 37 (H) 02/05/2019 1547   CREATININE 2.08 (H) 02/05/2019 1547   CREATININE 1.53 (H) 10/20/2015 1213   CALCIUM 9.9 02/05/2019 1547   PROT 7.2 01/27/2019 1743   ALBUMIN 4.0 01/27/2019 1743   AST 13 01/27/2019 1743   ALT 4 01/27/2019 1743   ALKPHOS 63 01/27/2019 1743   BILITOT <0.2 01/27/2019 1743   GFRNONAA 31 (L) 02/05/2019 1547   GFRNONAA 46 (L) 10/20/2015 1213   GFRAA 36 (L) 02/05/2019 1547   GFRAA 53 (L) 10/20/2015 1213   Lab Results  Component Value Date   CHOL 159 10/20/2015   HDL 32 (L) 10/20/2015   LDLCALC 66 10/20/2015   LDLDIRECT 85 07/04/2011   TRIG 307 (H) 10/20/2015   CHOLHDL 5.0 10/20/2015   Lab Results  Component Value Date   HGBA1C 6.8 (A) 01/27/2019   Lab Results  Component Value Date   VITAMINB12 835 11/27/2017   Lab Results  Component Value Date   TSH 1.990 07/26/2016     ASSESSMENT AND PLAN 74 y.o. year old male  has a past medical history of Bilateral foot pain, Chronic pain, Diabetes mellitus, Foley catheter in place (since nov 2018, last changed 1 week ago), Hyperlipidemia, Hypertension, Inguinal hernia, Numbness, Parkinson's disease ([place]), Pneumonia (yrs ago), Shortness of breath, and Urinary retention. here with   No diagnosis found.  Ralphie continues to have concerns of increased tremor.  Unfortunately, he is a very poor historian and we have had difficulty with him and his caretaker with medication  compliance. He is having dyskinesias.  No orders of the defined types were placed in this encounter.  -  We had a long discussion, will need more information on when he takes his medication and when they notice the dyskinesias. At this time will decrease his medication and see if the dyskinesias improve, may consider Rytary or Amantadine.  -   We have discussed PT referral.    -  We will continue Aricept 10 mg at night.  MMSE is stable at 22 of 30.   -.  Safety and fall precautions given.  I have suggested that he wear his alert necklace at all times but especially when he  is alone.   - chronic neck pain, s/p decompression, worsening, need mri cervical spine to evaluate for new myelopathy contributing to abnormal movements and gait disorder  No orders of the defined types were placed in this encounter.   A total of 40  minutes was spent face-to-face with this patient. Over half this time was spent on counseling patient on the  No diagnosis found. diagnosis and different diagnostic and therapeutic options, counseling and coordination of care, risks ans benefits of management, compliance, or risk factor reduction and education.

## 2019-04-01 ENCOUNTER — Telehealth: Payer: Self-pay | Admitting: *Deleted

## 2019-04-01 ENCOUNTER — Other Ambulatory Visit: Payer: Self-pay | Admitting: Neurology

## 2019-04-01 DIAGNOSIS — G20 Parkinson's disease: Secondary | ICD-10-CM

## 2019-04-01 DIAGNOSIS — R269 Unspecified abnormalities of gait and mobility: Secondary | ICD-10-CM

## 2019-04-01 NOTE — Telephone Encounter (Signed)
I have already talked Case Worker Joy. Patient is already getting service through there [facility]  as far as a Quarry manager . I have spoke to insurance they will not cover both. I have spoke to patient's daughter and she will take him to out patient physical therapy . Please place order. Thanks Hinton Dyer.

## 2019-04-01 NOTE — Telephone Encounter (Signed)
Done. thanks

## 2019-04-01 NOTE — Telephone Encounter (Signed)
[facility] care hub left a number for you to call Tyler Lester case worker Tyler Lester @ [phone].

## 2019-04-01 NOTE — Telephone Encounter (Signed)
I have already talked Case Worker Joy. Patient is already getting service through there [place]  as far as a Quarry manager . I have spoke to insurance they will not cover both. I have spoke to patient's daughter and she will take him to out patient physical therapy . Please place order. Thanks Hinton Dyer.

## 2019-04-17 ENCOUNTER — Ambulatory Visit: Payer: Medicare Other | Admitting: Family Medicine

## 2019-04-20 ENCOUNTER — Other Ambulatory Visit: Payer: Self-pay

## 2019-04-20 ENCOUNTER — Ambulatory Visit (INDEPENDENT_AMBULATORY_CARE_PROVIDER_SITE_OTHER): Payer: Medicare Other | Admitting: Family Medicine

## 2019-04-20 ENCOUNTER — Encounter: Payer: Self-pay | Admitting: Family Medicine

## 2019-04-20 VITALS — BP 104/56 | HR 85 | Ht 69.0 in | Wt 201.2 lb

## 2019-04-20 DIAGNOSIS — I952 Hypotension due to drugs: Secondary | ICD-10-CM

## 2019-04-20 DIAGNOSIS — G20 Parkinson's disease: Secondary | ICD-10-CM

## 2019-04-20 DIAGNOSIS — L602 Onychogryphosis: Secondary | ICD-10-CM | POA: Diagnosis not present

## 2019-04-20 DIAGNOSIS — E118 Type 2 diabetes mellitus with unspecified complications: Secondary | ICD-10-CM

## 2019-04-20 NOTE — Assessment & Plan Note (Signed)
Onychogryphosis of hands and feet.  Also with diabetes and Parkinson's making him need foot care, he is unsafe to do it on his own. -Nails trimmed today -Referral to podiatry for continued lower extremity nail maintenance -Follow-up in 2 months for hand trimmings

## 2019-04-20 NOTE — Assessment & Plan Note (Signed)
Patient was asymptomatic diastolic hypotension.  On low-dose lisinopril.  Recommend decrease today.  Follow-up in 1 month to recheck blood pressure.

## 2019-04-20 NOTE — Patient Instructions (Signed)
Fingernail or Toenail Removal, Adult, Care After °This sheet gives you information about how to care for yourself after your procedure. Your health care provider may also give you more specific instructions. If you have problems or questions, contact your health care provider. °What can I expect after the procedure? °After the procedure, it is common to have: °· Pain. °· Redness. °· Swelling. °· Soreness. °Follow these instructions at home: °Medicines °· Take over-the-counter and prescription medicines only as told by your health care provider. °· If you were prescribed an antibiotic medicine, take or apply it as told by your health care provider. Do not stop using the antibiotic even if you start to feel better. °Wound care °· Follow instructions from your health care provider about how to take care of your wound. Make sure you: °? Wash your hands with soap and water for at least 20 seconds before and after you change your bandage (dressing). If soap and water are not available, use hand sanitizer. °? Change your dressing as told by your health care provider. °? Keep your dressing dry until your health care provider says it can be removed. °· Check your wound every day for signs of infection. Check for: °? More redness, swelling, or pain. °? More fluid or blood. °? Warmth. °? Pus or a bad smell. °If you have a splint: ° °· Wear the splint as told by your health care provider. Remove it only as told by your health care provider. °· Loosen the splint if your fingers tingle, become numb, or turn cold and blue. °· Keep the splint clean. °· If the splint is not waterproof: °? Do not let it get wet. °? Cover it with a watertight covering when you take a bath or a shower. °Managing pain, stiffness, and swelling °· Move your fingers or toes often to reduce stiffness and swelling. °· Raise (elevate) the injured area above the level of your heart while you are sitting or lying down. You may need to keep your hand or foot  raised or supported on a pillow for 24 hours or as told by your health care provider. °General instructions °· If you were given a shoe to wear, wear it as told by your health care provider. °· Keep all follow-up visits as told by your health care provider. This is important. °Contact a health care provider if: °· You have more redness, swelling, or pain around your wound. °· You have more fluid or blood coming from your wound. °· You have pus or a bad smell coming from your wound. °· Your wound feels warm to the touch. °· You have a fever. °Get help right away if: °· Your finger or toe looks pale, blue, or black. °· You are not able to move your finger or toe. °Summary °· After the procedure, it is common to have pain and swelling. °· Keep the hand or foot raised (elevated) or supported on a pillow as told by your health care provider. °· Take over-the-counter and prescription medicines only as told by your health care provider. °· Check your wound every day for signs of infection. °This information is not intended to replace advice given to you by your health care provider. Make sure you discuss any questions you have with your health care provider. °Document Revised: 10/06/2018 Document Reviewed: 10/06/2018 °Elsevier Patient Education © 2020 [employer] ° °

## 2019-04-20 NOTE — Progress Notes (Signed)
    SUBJECTIVE:   CHIEF COMPLAINT / HPI:   Onychogryphosis Patient presents today to have his nails trimmed.  History of Parkinson's disease with poor mobility.  Unable to safely trim themselves.  Patient with a history of thick nails on hands and feet.  It caused him to have difficulty wearing his footwear and rub his toes causing injury.  Asymptomatic hypertension Patient was told by his "another doctor".  His blood pressure was too low.  He was advised to decrease his lisinopril.  Patient has continued to take it.  Denies any symptoms of shortness of breath, dizziness upon standing.  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  Also with diabetes and Parkinson's making him need foot care, he is unsafe to do it on his own. -Nails trimmed today -Referral to podiatry for continued lower extremity nail maintenance -Follow-up in 2 months for hand trimmings  Hypotension Patient was asymptomatic diastolic hypotension.  On low-dose lisinopril.  Recommend decrease today.  Follow-up in 1 month to recheck blood pressure.     Bonnita Hollow, MD [place]

## 2019-04-28 DIAGNOSIS — H25811 Combined forms of age-related cataract, right eye: Secondary | ICD-10-CM | POA: Diagnosis not present

## 2019-04-28 DIAGNOSIS — H527 Unspecified disorder of refraction: Secondary | ICD-10-CM | POA: Diagnosis not present

## 2019-04-28 DIAGNOSIS — Z961 Presence of intraocular lens: Secondary | ICD-10-CM | POA: Diagnosis not present

## 2019-04-28 DIAGNOSIS — R7303 Prediabetes: Secondary | ICD-10-CM | POA: Diagnosis not present

## 2019-04-28 DIAGNOSIS — H3562 Retinal hemorrhage, left eye: Secondary | ICD-10-CM | POA: Diagnosis not present

## 2019-04-29 ENCOUNTER — Telehealth: Payer: Self-pay | Admitting: Neurology

## 2019-04-29 ENCOUNTER — Ambulatory Visit: Payer: Medicare Other | Admitting: Neurology

## 2019-04-29 NOTE — Telephone Encounter (Signed)
Pt called to cancel today's appt. Pt has had several no shows and 24hr cancellation's. Will pt be able to r/s ? Please advise.

## 2019-04-30 NOTE — Telephone Encounter (Signed)
Spoke with Dr. Jaynee Eagles. Please call the patient back and reschedule him to Amy NP or [facility] NP. He does not need to see the physician for this follow-up.

## 2019-04-30 NOTE — Telephone Encounter (Signed)
Pt called but to reschedule advised message to RN is still pending review   Provided alt number to reach [phone]

## 2019-04-30 NOTE — Telephone Encounter (Signed)
Pt was rescheduled with next available with MD no sooner availability with NP's. Pt states he is having memory issues and would like to know if he can be worked in sooner

## 2019-05-12 DIAGNOSIS — G8929 Other chronic pain: Secondary | ICD-10-CM | POA: Diagnosis not present

## 2019-05-12 DIAGNOSIS — M5442 Lumbago with sciatica, left side: Secondary | ICD-10-CM | POA: Diagnosis not present

## 2019-05-12 DIAGNOSIS — M5441 Lumbago with sciatica, right side: Secondary | ICD-10-CM | POA: Diagnosis not present

## 2019-05-12 DIAGNOSIS — E119 Type 2 diabetes mellitus without complications: Secondary | ICD-10-CM | POA: Diagnosis not present

## 2019-05-12 DIAGNOSIS — Z79899 Other long term (current) drug therapy: Secondary | ICD-10-CM | POA: Diagnosis not present

## 2019-05-14 DIAGNOSIS — Z9181 History of falling: Secondary | ICD-10-CM | POA: Diagnosis not present

## 2019-05-14 DIAGNOSIS — G20 Parkinson's disease: Secondary | ICD-10-CM | POA: Diagnosis not present

## 2019-05-14 DIAGNOSIS — G8929 Other chronic pain: Secondary | ICD-10-CM | POA: Diagnosis not present

## 2019-05-25 DIAGNOSIS — R338 Other retention of urine: Secondary | ICD-10-CM | POA: Diagnosis not present

## 2019-05-25 DIAGNOSIS — R3915 Urgency of urination: Secondary | ICD-10-CM | POA: Diagnosis not present

## 2019-05-27 ENCOUNTER — Ambulatory Visit: Payer: Medicare Other

## 2019-05-27 NOTE — Progress Notes (Deleted)
    SUBJECTIVE:   CHIEF COMPLAINT: Possible UTI  HPI:   DYSURIA  Pain urinating started *** days ago. Pain is: *** Medications tried: *** Any antibiotics in the last 30 days: *** More than 3 UTIs in the last 12 months: *** STD exposure: *** Possibly pregnant: ***  Symptoms Urgency: *** Frequency: *** Blood in urine: *** Pain in back:*** Fever: *** Vaginal discharge: *** Mouth Ulcers: ***  Review of Symptoms - see HPI PMH - Smoking status noted.      PERTINENT  PMH / PSH: HTN, diastolic heart failure, PAD, GERD, allergic rhinitis, T2DM, Parkinson's disease, chronic bilateral low back pain with sciatica, CKD 2, BPH, HLD  OBJECTIVE:   There were no vitals taken for this visit.  ***  ASSESSMENT/PLAN:   No problem-specific Assessment & Plan notes found for this encounter.     Rory Percy, [place]

## 2019-06-01 ENCOUNTER — Ambulatory Visit: Payer: Medicare Other

## 2019-06-08 ENCOUNTER — Ambulatory Visit: Payer: Medicare Other | Admitting: Neurology

## 2019-06-12 DIAGNOSIS — Z1159 Encounter for screening for other viral diseases: Secondary | ICD-10-CM | POA: Diagnosis not present

## 2019-06-12 DIAGNOSIS — M5441 Lumbago with sciatica, right side: Secondary | ICD-10-CM | POA: Diagnosis not present

## 2019-06-12 DIAGNOSIS — M5442 Lumbago with sciatica, left side: Secondary | ICD-10-CM | POA: Diagnosis not present

## 2019-06-12 DIAGNOSIS — G8929 Other chronic pain: Secondary | ICD-10-CM | POA: Diagnosis not present

## 2019-06-12 DIAGNOSIS — E559 Vitamin D deficiency, unspecified: Secondary | ICD-10-CM | POA: Diagnosis not present

## 2019-06-12 DIAGNOSIS — E119 Type 2 diabetes mellitus without complications: Secondary | ICD-10-CM | POA: Diagnosis not present

## 2019-06-12 DIAGNOSIS — E7800 Pure hypercholesterolemia, unspecified: Secondary | ICD-10-CM | POA: Diagnosis not present

## 2019-06-12 DIAGNOSIS — Z79899 Other long term (current) drug therapy: Secondary | ICD-10-CM | POA: Diagnosis not present

## 2019-06-12 DIAGNOSIS — M109 Gout, unspecified: Secondary | ICD-10-CM | POA: Diagnosis not present

## 2019-06-12 DIAGNOSIS — I10 Essential (primary) hypertension: Secondary | ICD-10-CM | POA: Diagnosis not present

## 2019-06-16 NOTE — Progress Notes (Deleted)
PATIENT: Tyler Lester DOB: 1945/05/24  Interval history 01/26/2019: Patient appears to be having dyskinesias. Difficult to address as he is a poor historian as is his caretaker, we have asked in the past for more details on his medication, turning on and turning off, times of his medication. I had a long discussion, will need more information on when he takes his medication and when they notice the dyskinesias. At this time will decrease his medication and see if the dyskinesias improve, may consider Rytary or Amantadine.  It just started 7 days ago. He takes his medication at 11am, 2pm and 5pm. He takes 2 pills each time. She comes in at 9am, he last took his medication at 8am he is not sure so 3.5 hours ago. He never had PT. We will send PT to his home, they have not paid attention on the medication when it starts working or when the dyskinesias occr, when the medication wears off.   REASON FOR VISIT: follow up HISTORY FROM: patient  No chief complaint on file.    HISTORY OF PRESENT ILLNESS: Today 06/16/19 Tyler Lester is a 74 y.o. male here today for follow up for Parkinson's Disease and worsening tremor. He was seen in 04/2018 and Simemet was increased to 2 tablets four times daily. He states that he has not increased medication. He continues Sinemet 2 tablets at 8am, 12p and 2p. He has noticed worsening tremor. He is having a difficult time with using restroom. Tremor is noted at rest and worse with activity. He feels that he is slower with movements. He is using single point cane today but also uses Rolator at home. He was also referred to PT but did not go. He was referred to PT recently by PCP for hip pain. He has a caregiver with him today that reports other than tremor he seems to be doing well. No falls. He feels memory is stable on Aricept. He has a caregiver from 8a-5p. He lives alone. He has community support and friends that check on him in the evenings. He does not drive.     HISTORY: (copied from  note on 05/14/2018)  Tyler Lester is a 74 y.o. male here today for follow up Parkinson's Disease.  He reports doing fairly well with the exception of increased tremor in the left hand.  He feels that the tremor is worse in the mornings.  This tremor does improve after taking Sinemet.  He is also noticed some change in his gait.  He feels that he is not able to lift his right leg normally.  He denies any falls.  He does use a cane or walker with long distance walking.  He denies any dizziness or lightheadedness.  No swallowing difficulties no drooling.  He feels that his memory is stable.  He is taking Aricept 10 mg daily as prescribed.  Sinemet 25/100 mg 2 capsules 3 times a day.  He reports dosing is typically at 8 AM, 12 PM and 6 PM.  HISTORY: (copied from Dr Cathren Laine note on 11/27/2017)  Interval history 11/27/2017: Patient is here for Parkinson's Disease. Unfortunately in the past he has been non compliant with medication and management plan and also refused help such as [facility] to help with his medication management. He did not come back because I discussed I couldn't help him. Today he returns and wonderfully he had a nurse/caregiver with him who is there at his home and helps with medication management.  No falls in the home. Using a walker. No swallowing difficulties. No hallucinations or elusions. No drooling. No dizziness, not lightheaded, good appetite. He has a tremor, Sinemet helps when he takes. He denies rem sleep disorder. Sleeping well. During the day awake and alert.  HPI: Tyler Lester is a 74y.o. male here as a follow up for Parkinsonism, memory loss, gait ataxia, non-compliance. Memory problems started years ago. The tremor is more at rest. And the jaw trembles as well. Feels like his voice is getting softer. He has vivid dreams. He has drool on his pillow. He has fallen a few times and he catches himslef. He has loss of smell and taste. He can't smell  dinner. He is swallowing OK. His handwriting has gotten smaller, when he writes memos he has noticed he has gotten smaller.   Interval history 02/15/2015:He does not know what medications he is taking. He has refused [facility] or home health care to help him with medications. He is a poor historian. I have asked him multiple times to bring in his medication bottles and he always forgets. i had a frank conversation with patient, I cannot manage his parkinsonism in this manner. He is supposed to be on Sinemet and Azilect. Called the pharmacy and he has not filled [place] since July. Sinemet was last filled on 12/5 but before then he filled it in September for a month. Not compliant.  Interval history 11/17/2014: He is having neck pain. He was scheduled today to discuss memory loss and he was supposed to bring a family member but he forgot. He would like pain medication and gets Vicadin but Dr. Lonny Prude won't give hm anymore because he had alcohol in his urine. He is not wearing his neupro patch today. I am not sure he is taking his medications. He declines any services to help him in the house. He has not started the Lyrica. He never got the MRI of the brain completed that I ordered last December. He says the neupro patch doesn't work and gives him a lot of urges to have sex.  Interval History 03/22/2014: the tremor continues. He appears to be a poor historian and doesn't quite know what medications he is taking. He is not having any side effects from the Azilect and says he believes he is taking it. He also has neck pain and low back pain. He is on pain medication and is waiting to be seen by pain management. He had recent cervical spine surgery and has had lumber spine surgery in the past. He is having shooting pain from low back into right leg that is chronic and he is being managed by orthopaedic doctor. The orthopedist cancelled his pain medication because he was already getting vicodin from dr. Teryl Lucy and  patient wants to know if I can give him the percocet instead of vicodin. He is only taking 200mg  three times daily (at first he said just once daily) of neurontin. No side effects from the gabapentin.   02/03/2014: Tyler Lester is a 74 y.o. male here as a referral from Dr. Lonny Prude for Tremor. PMHx cervical fusion (September), chronic back pain, DM, HTN, fatigue, depression, anxiety, myelopathy.   Was sent here by Dr. Teryl Lucy for evaluation of tremor. He has a tremor in his left hand and his jaw. Tremor has only been going on for at least a year but worse in the last 3 months. Getting worse after neck surgery. Worse in the left but also in the  right hand as well as the chin. He can't walk, he has chronic pain in his low back and neck. Walking slow, doesn't have energy. No tremors in other family members. Doesn't notice the tremor getting better with alcohol. No constipation. Can hardly smell. Sometimes he is talking low, softer. Hand writing is shaky, smaller. Feels weak. TSH was normal in June. Does not drink caffeine. 1-2 drinks a week, no significant alcohol. No FHx of parkinson's or neurodegenerative disease. Has had falls.   REVIEW OF SYSTEMS: Out of a complete 14 system review of symptoms, the patient complains only of the following symptoms, eye redness, leg swelling, tremors, neck pain, neck stiffness, and all other reviewed systems are negative.  ALLERGIES: Allergies  Allergen Reactions  . Poison Ivy Treatments Hives    Pt denies allergy to this medication    HOME MEDICATIONS: Outpatient Medications Prior to Visit  Medication Sig Dispense Refill  . carbidopa-levodopa (SINEMET) 25-100 MG tablet Take 1-1.5 tablets by mouth 4 (four) times daily. Take 3-4 hours apart such at 8am, 11-12am, 2-4pm and 6-8pm for example. Avoid high protein diet. 540 tablet 1  . Cholecalciferol (VITAMIN D) 2000 units CAPS Take 1 capsule (2,000 Units total) by mouth daily. 90 capsule 11  . donepezil (ARICEPT)  10 MG tablet Take 1 Tablet by mouth every day 30 tablet 11  . fluticasone (FLONASE) 50 MCG/ACT nasal spray Place 2 sprays into both nostrils daily. 16 g 11  . gabapentin (NEURONTIN) 100 MG capsule TAKE 2 CAPSULES (200 MG TOTAL) BY MOUTH 3 (THREE) TIMES DAILY. 180 capsule 1  . Multiple Vitamins-Minerals (MULTIVITAMIN MEN 50+) TABS Take 1 tablet by mouth daily. 90 tablet 11  . naloxone (NARCAN) 2 MG/2ML injection Inject 0.4 mLs (0.4 mg total) into the muscle as needed (opiod overdose. May repeat in two to three minutes.). (Patient not taking: Reported on 01/26/2019) 2 mL 1  . omeprazole (PRILOSEC) 20 MG capsule TAKE 1 CAPSULE BY MOUTH EVERY DAY 90 capsule 3  . oxyCODONE-acetaminophen (PERCOCET) 7.5-325 MG tablet TK 1 T PO Q 6 H PRN FOR PAIN  0   Facility-Administered Medications Prior to Visit  Medication Dose Route Frequency Provider Last Rate Last Admin  . magnesium citrate solution 1 Bottle  1 Bottle Oral Once Alexis Frock, MD        PAST MEDICAL HISTORY: Past Medical History:  Diagnosis Date  . Bilateral foot pain   . Chronic pain   . Diabetes mellitus    type 2, diet controlled now  . Foley catheter in place since nov 2018, last changed 1 week ago  . Hyperlipidemia   . Hypertension   . Inguinal hernia    right  . Numbness    T/O  . Parkinson's disease ([place])   . Pneumonia yrs ago  . Shortness of breath    with exertion  . Urinary retention     PAST SURGICAL HISTORY: Past Surgical History:  Procedure Laterality Date  . CATARACT EXTRACTION W/ INTRAOCULAR LENS IMPLANT Left 06/2012   laser for posterior capsule?  . COLON SURGERY    . POSTERIOR CERVICAL FUSION/FORAMINOTOMY N/A 11/12/2013   Procedure: Posterior cervical decompression fusion, cervical 3-4, cervical 4-5, cervical 5-6, cervical 6-7 with instrumentation and allograft   (LEVEL 4);  Surgeon: Sinclair Ship, MD;  Location: [facility];  Service: Orthopedics;  Laterality: N/A;  Posterior cervical decompression fusion,  cervical 3-4, cervical 4-5, cervical 5-6, cervical 6-7 with instrumentation and allograft  . TONSILLECTOMY    .  XI ROBOTIC ASSISTED SIMPLE PROSTATECTOMY N/A 03/07/2016   Procedure: XI ROBOTIC ASSISTED SIMPLE PROSTATECTOMY AND UMBILICAL HERNIA REPAIR flexible cystoscopy;  Surgeon: Alexis Frock, MD;  Location: [facility];  Service: Urology;  Laterality: N/A;    FAMILY HISTORY: Family History  Problem Relation Age of Onset  . Cancer - Other Other   . Diabetes Other   . Hypertension Other     SOCIAL HISTORY: Social History   Socioeconomic History  . Marital status: Single    Spouse name: Not on file  . Number of children: 2  . Years of education: 21  . Highest education level: Not on file  Occupational History  . Occupation: Retired     Comment: Event organiser at [employer]  . Smoking status: Never Smoker  . Smokeless tobacco: Never Used  Substance and Sexual Activity  . Alcohol use: Yes    Alcohol/week: 0.0 standard drinks    Comment: 1 shot a day  . Drug use: No  . Sexual activity: Not on file  Other Topics Concern  . Not on file  Social History Narrative   Patient lives at home alone.  He has a caregiver from 9 AM- 5 PM Monday through Sunday.   Patient have 2 children.    Patient has a high school education    Patient is right handed.    Patient is retired on disability.    Social Determinants of Health   Financial Resource Strain:   . Difficulty of Paying Living Expenses:   Food Insecurity:   . Worried About Charity fundraiser in the Last Year:   . Arboriculturist in the Last Year:   Transportation Needs:   . Film/video editor (Medical):   Marland Kitchen Lack of Transportation (Non-Medical):   Physical Activity:   . Days of Exercise per Week:   . Minutes of Exercise per Session:   Stress:   . Feeling of Stress :   Social Connections:   . Frequency of Communication with Friends and Family:   . Frequency of Social Gatherings with Friends and Family:   .  Attends Religious Services:   . Active Member of Clubs or Organizations:   . Attends Archivist Meetings:   Marland Kitchen Marital Status:   Intimate Partner Violence:   . Fear of Current or Ex-Partner:   . Emotionally Abused:   Marland Kitchen Physically Abused:   . Sexually Abused:       PHYSICAL EXAM  There were no vitals filed for this visit. There is no height or weight on file to calculate BMI.  Generalized: Well developed, flat affect, in no acute distress  Cardiology: normal rate and rhythm, no murmur noted Neurological examination  Mentation: Alert oriented to time, place, history taking. Follows all commands speech and language fluent Cranial nerve II-XII: Pupils were equal round reactive to light. Extraocular movements were full, visual field were full on confrontational test. Facial sensation and strength were normal. Uvula tongue midline. Head turning and shoulder shrug  were normal and symmetric. Motor: The motor testing reveals 5 over 5 strength of all 4 extremities. Good symmetric motor tone is noted throughout. Bradykinesia noted with finger taps and toe taps, no cogwheel rigidity  Sensory: Sensory testing is intact to soft touch on all 4 extremities. No evidence of extinction is noted.  Coordination: Cerebellar testing reveals slow but good finger-nose-finger and heel-to-shin bilaterally.  Gait and station: Gait is shuffeled, uses single point cane, wide turns, spastic  DIAGNOSTIC DATA (LABS, IMAGING, TESTING) - I reviewed patient records, labs, notes, testing and imaging myself where available.  MMSE - Mini Mental State Exam 10/22/2018 05/14/2018  Not completed: (No Data) (No Data)  Orientation to time 5 4  Orientation to Place 3 5  Registration 3 3  Attention/ Calculation 0 0  Recall 3 2  Language- name 2 objects 2 2  Language- repeat 1 1  Language- follow 3 step command 3 3  Language- read & follow direction 1 1  Write a sentence 1 0  Copy design 1 1  Total score 23 22       Lab Results  Component Value Date   WBC 6.7 01/27/2019   HGB 13.6 01/27/2019   HCT 40.0 01/27/2019   MCV 84 01/27/2019   PLT 224 01/27/2019      Component Value Date/Time   NA 141 02/05/2019 1547   K 4.4 02/05/2019 1547   CL 106 02/05/2019 1547   CO2 21 02/05/2019 1547   GLUCOSE 104 (H) 02/05/2019 1547   GLUCOSE 172 (H) 03/08/2016 0545   BUN 37 (H) 02/05/2019 1547   CREATININE 2.08 (H) 02/05/2019 1547   CREATININE 1.53 (H) 10/20/2015 1213   CALCIUM 9.9 02/05/2019 1547   PROT 7.2 01/27/2019 1743   ALBUMIN 4.0 01/27/2019 1743   AST 13 01/27/2019 1743   ALT 4 01/27/2019 1743   ALKPHOS 63 01/27/2019 1743   BILITOT <0.2 01/27/2019 1743   GFRNONAA 31 (L) 02/05/2019 1547   GFRNONAA 46 (L) 10/20/2015 1213   GFRAA 36 (L) 02/05/2019 1547   GFRAA 53 (L) 10/20/2015 1213   Lab Results  Component Value Date   CHOL 159 10/20/2015   HDL 32 (L) 10/20/2015   LDLCALC 66 10/20/2015   LDLDIRECT 85 07/04/2011   TRIG 307 (H) 10/20/2015   CHOLHDL 5.0 10/20/2015   Lab Results  Component Value Date   HGBA1C 6.8 (A) 01/27/2019   Lab Results  Component Value Date   VITAMINB12 835 11/27/2017   Lab Results  Component Value Date   TSH 1.990 07/26/2016     ASSESSMENT AND PLAN 74 y.o. year old male  has a past medical history of Bilateral foot pain, Chronic pain, Diabetes mellitus, Foley catheter in place (since nov 2018, last changed 1 week ago), Hyperlipidemia, Hypertension, Inguinal hernia, Numbness, Parkinson's disease ([place]), Pneumonia (yrs ago), Shortness of breath, and Urinary retention. here with   No diagnosis found.  [place] continues to have concerns of increased tremor.  Unfortunately, he is a very poor historian and we have had difficulty with him and his caretaker with medication compliance. He is having dyskinesias.  No orders of the defined types were placed in this encounter.  -  We had a long discussion, will need more information on when he takes his medication and  when they notice the dyskinesias. At this time will decrease his medication and see if the dyskinesias improve, may consider Rytary or Amantadine.  -   We have discussed PT referral.    -  We will continue Aricept 10 mg at night.  MMSE is stable at 22 of 30.   -.  Safety and fall precautions given.  I have suggested that he wear his alert necklace at all times but especially when he is alone.   - chronic neck pain, s/p decompression, worsening, need mri cervical spine to evaluate for new myelopathy contributing to abnormal movements and gait disorder  No orders of the defined types were placed  in this encounter.   A total of 40  minutes was spent face-to-face with this patient. Over half this time was spent on counseling patient on the  No diagnosis found. diagnosis and different diagnostic and therapeutic options, counseling and coordination of care, risks ans benefits of management, compliance, or risk factor reduction and education.

## 2019-06-17 ENCOUNTER — Ambulatory Visit: Payer: Medicare Other | Admitting: Neurology

## 2019-07-07 ENCOUNTER — Ambulatory Visit: Payer: Medicare Other | Admitting: Neurology

## 2019-07-07 ENCOUNTER — Other Ambulatory Visit: Payer: Self-pay | Admitting: Family Medicine

## 2019-07-07 DIAGNOSIS — R0982 Postnasal drip: Secondary | ICD-10-CM

## 2019-07-07 NOTE — Progress Notes (Deleted)
PATIENT: Tyler Lester DOB: 1945-03-30  Interval history 01/26/2019: Patient appears to be having dyskinesias. Difficult to address as he is a poor historian as is his caretaker, we have asked in the past for more details on his medication, turning on and turning off, times of his medication. I had a long discussion, will need more information on when he takes his medication and when they notice the dyskinesias. At this time will decrease his medication and see if the dyskinesias improve, may consider Rytary or Amantadine.  It just started 7 days ago. He takes his medication at 11am, 2pm and 5pm. He takes 2 pills each time. She comes in at 9am, he last took his medication at 8am he is not sure so 3.5 hours ago. He never had PT. We will send PT to his home, they have not paid attention on the medication when it starts working or when the dyskinesias occr, when the medication wears off.   REASON FOR VISIT: follow up HISTORY FROM: patient  No chief complaint on file.    HISTORY OF PRESENT ILLNESS: Today 07/07/19 Tyler Lester is a 74 y.o. male here today for follow up for Parkinson's Disease and worsening tremor. He was seen in 04/2018 and Simemet was increased to 2 tablets four times daily. He states that he has not increased medication. He continues Sinemet 2 tablets at 8am, 12p and 2p. He has noticed worsening tremor. He is having a difficult time with using restroom. Tremor is noted at rest and worse with activity. He feels that he is slower with movements. He is using single point cane today but also uses Rolator at home. He was also referred to PT but did not go. He was referred to PT recently by PCP for hip pain. He has a caregiver with him today that reports other than tremor he seems to be doing well. No falls. He feels memory is stable on Aricept. He has a caregiver from 8a-5p. He lives alone. He has community support and friends that check on him in the evenings. He does not drive.     HISTORY: (copied from  note on 05/14/2018)  Tyler Lester is a 74 y.o. male here today for follow up Parkinson's Disease.  He reports doing fairly well with the exception of increased tremor in the left hand.  He feels that the tremor is worse in the mornings.  This tremor does improve after taking Sinemet.  He is also noticed some change in his gait.  He feels that he is not able to lift his right leg normally.  He denies any falls.  He does use a cane or walker with long distance walking.  He denies any dizziness or lightheadedness.  No swallowing difficulties no drooling.  He feels that his memory is stable.  He is taking Aricept 10 mg daily as prescribed.  Sinemet 25/100 mg 2 capsules 3 times a day.  He reports dosing is typically at 8 AM, 12 PM and 6 PM.  HISTORY: (copied from Dr Cathren Laine note on 11/27/2017)  Interval history 11/27/2017: Patient is here for Parkinson's Disease. Unfortunately in the past he has been non compliant with medication and management plan and also refused help such as [facility] to help with his medication management. He did not come back because I discussed I couldn't help him. Today he returns and wonderfully he had a nurse/caregiver with him who is there at his home and helps with medication management.  No falls in the home. Using a walker. No swallowing difficulties. No hallucinations or elusions. No drooling. No dizziness, not lightheaded, good appetite. He has a tremor, Sinemet helps when he takes. He denies rem sleep disorder. Sleeping well. During the day awake and alert.  HPI: Tyler Lester is a 74y.o. male here as a follow up for Parkinsonism, memory loss, gait ataxia, non-compliance. Memory problems started years ago. The tremor is more at rest. And the jaw trembles as well. Feels like his voice is getting softer. He has vivid dreams. He has drool on his pillow. He has fallen a few times and he catches himslef. He has loss of smell and taste. He can't smell  dinner. He is swallowing OK. His handwriting has gotten smaller, when he writes memos he has noticed he has gotten smaller.   Interval history 02/15/2015:He does not know what medications he is taking. He has refused bayada or home health care to help him with medications. He is a poor historian. I have asked him multiple times to bring in his medication bottles and he always forgets. i had a frank conversation with patient, I cannot manage his parkinsonism in this manner. He is supposed to be on Sinemet and Azilect. Called the pharmacy and he has not filled Cecil since July. Sinemet was last filled on 12/5 but before then he filled it in September for a month. Not compliant.  Interval history 11/17/2014: He is having neck pain. He was scheduled today to discuss memory loss and he was supposed to bring a family member but he forgot. He would like pain medication and gets Vicadin but Dr. Lonny Prude won't give hm anymore because he had alcohol in his urine. He is not wearing his neupro patch today. I am not sure he is taking his medications. He declines any services to help him in the house. He has not started the Lyrica. He never got the MRI of the brain completed that I ordered last December. He says the neupro patch doesn't work and gives him a lot of urges to have sex.  Interval History 03/22/2014: the tremor continues. He appears to be a poor historian and doesn't quite know what medications he is taking. He is not having any side effects from the Azilect and says he believes he is taking it. He also has neck pain and low back pain. He is on pain medication and is waiting to be seen by pain management. He had recent cervical spine surgery and has had lumber spine surgery in the past. He is having shooting pain from low back into right leg that is chronic and he is being managed by orthopaedic doctor. The orthopedist cancelled his pain medication because he was already getting vicodin from dr. Teryl Lucy and  patient wants to know if I can give him the percocet instead of vicodin. He is only taking 200mg  three times daily (at first he said just once daily) of neurontin. No side effects from the gabapentin.   02/03/2014: Tyler Lester is a 74 y.o. male here as a referral from Dr. Lonny Prude for Tremor. PMHx cervical fusion (September), chronic back pain, DM, HTN, fatigue, depression, anxiety, myelopathy.   Was sent here by Dr. Teryl Lucy for evaluation of tremor. He has a tremor in his left hand and his jaw. Tremor has only been going on for at least a year but worse in the last 3 months. Getting worse after neck surgery. Worse in the left but also in the  right hand as well as the chin. He can't walk, he has chronic pain in his low back and neck. Walking slow, doesn't have energy. No tremors in other family members. Doesn't notice the tremor getting better with alcohol. No constipation. Can hardly smell. Sometimes he is talking low, softer. Hand writing is shaky, smaller. Feels weak. TSH was normal in June. Does not drink caffeine. 1-2 drinks a week, no significant alcohol. No FHx of parkinson's or neurodegenerative disease. Has had falls.   REVIEW OF SYSTEMS: Out of a complete 14 system review of symptoms, the patient complains only of the following symptoms, eye redness, leg swelling, tremors, neck pain, neck stiffness, and all other reviewed systems are negative.  ALLERGIES: Allergies  Allergen Reactions  . Poison Ivy Treatments Hives    Pt denies allergy to this medication    HOME MEDICATIONS: Outpatient Medications Prior to Visit  Medication Sig Dispense Refill  . carbidopa-levodopa (SINEMET) 25-100 MG tablet Take 1-1.5 tablets by mouth 4 (four) times daily. Take 3-4 hours apart such at 8am, 11-12am, 2-4pm and 6-8pm for example. Avoid high protein diet. 540 tablet 1  . Cholecalciferol (VITAMIN D) 2000 units CAPS Take 1 capsule (2,000 Units total) by mouth daily. 90 capsule 11  . donepezil (ARICEPT)  10 MG tablet Take 1 Tablet by mouth every day 30 tablet 11  . fluticasone (FLONASE) 50 MCG/ACT nasal spray Place 2 sprays into both nostrils daily. 16 g 11  . gabapentin (NEURONTIN) 100 MG capsule TAKE 2 CAPSULES (200 MG TOTAL) BY MOUTH 3 (THREE) TIMES DAILY. 180 capsule 1  . Multiple Vitamins-Minerals (MULTIVITAMIN MEN 50+) TABS Take 1 tablet by mouth daily. 90 tablet 11  . naloxone (NARCAN) 2 MG/2ML injection Inject 0.4 mLs (0.4 mg total) into the muscle as needed (opiod overdose. May repeat in two to three minutes.). (Patient not taking: Reported on 01/26/2019) 2 mL 1  . omeprazole (PRILOSEC) 20 MG capsule TAKE 1 CAPSULE BY MOUTH EVERY DAY 90 capsule 3  . oxyCODONE-acetaminophen (PERCOCET) 7.5-325 MG tablet TK 1 T PO Q 6 H PRN FOR PAIN  0   Facility-Administered Medications Prior to Visit  Medication Dose Route Frequency Provider Last Rate Last Admin  . magnesium citrate solution 1 Bottle  1 Bottle Oral Once Alexis Frock, MD        PAST MEDICAL HISTORY: Past Medical History:  Diagnosis Date  . Bilateral foot pain   . Chronic pain   . Diabetes mellitus    type 2, diet controlled now  . Foley catheter in place since nov 2018, last changed 1 week ago  . Hyperlipidemia   . Hypertension   . Inguinal hernia    right  . Numbness    T/O  . Parkinson's disease ([place])   . Pneumonia yrs ago  . Shortness of breath    with exertion  . Urinary retention     PAST SURGICAL HISTORY: Past Surgical History:  Procedure Laterality Date  . CATARACT EXTRACTION W/ INTRAOCULAR LENS IMPLANT Left 06/2012   laser for posterior capsule?  . COLON SURGERY    . POSTERIOR CERVICAL FUSION/FORAMINOTOMY N/A 11/12/2013   Procedure: Posterior cervical decompression fusion, cervical 3-4, cervical 4-5, cervical 5-6, cervical 6-7 with instrumentation and allograft   (LEVEL 4);  Surgeon: Sinclair Ship, MD;  Location: [facility];  Service: Orthopedics;  Laterality: N/A;  Posterior cervical decompression fusion,  cervical 3-4, cervical 4-5, cervical 5-6, cervical 6-7 with instrumentation and allograft  . TONSILLECTOMY    .  XI ROBOTIC ASSISTED SIMPLE PROSTATECTOMY N/A 03/07/2016   Procedure: XI ROBOTIC ASSISTED SIMPLE PROSTATECTOMY AND UMBILICAL HERNIA REPAIR flexible cystoscopy;  Surgeon: Alexis Frock, MD;  Location: [facility];  Service: Urology;  Laterality: N/A;    FAMILY HISTORY: Family History  Problem Relation Age of Onset  . Cancer - Other Other   . Diabetes Other   . Hypertension Other     SOCIAL HISTORY: Social History   Socioeconomic History  . Marital status: Single    Spouse name: Not on file  . Number of children: 2  . Years of education: 6  . Highest education level: Not on file  Occupational History  . Occupation: Retired     Comment: Event organiser at [employer]  . Smoking status: Never Smoker  . Smokeless tobacco: Never Used  Substance and Sexual Activity  . Alcohol use: Yes    Alcohol/week: 0.0 standard drinks    Comment: 1 shot a day  . Drug use: No  . Sexual activity: Not on file  Other Topics Concern  . Not on file  Social History Narrative   Patient lives at home alone.  He has a caregiver from 9 AM- 5 PM Monday through Sunday.   Patient have 2 children.    Patient has a high school education    Patient is right handed.    Patient is retired on disability.    Social Determinants of Health   Financial Resource Strain:   . Difficulty of Paying Living Expenses:   Food Insecurity:   . Worried About Charity fundraiser in the Last Year:   . Arboriculturist in the Last Year:   Transportation Needs:   . Film/video editor (Medical):   Marland Kitchen Lack of Transportation (Non-Medical):   Physical Activity:   . Days of Exercise per Week:   . Minutes of Exercise per Session:   Stress:   . Feeling of Stress :   Social Connections:   . Frequency of Communication with Friends and Family:   . Frequency of Social Gatherings with Friends and Family:   .  Attends Religious Services:   . Active Member of Clubs or Organizations:   . Attends Archivist Meetings:   Marland Kitchen Marital Status:   Intimate Partner Violence:   . Fear of Current or Ex-Partner:   . Emotionally Abused:   Marland Kitchen Physically Abused:   . Sexually Abused:       PHYSICAL EXAM  There were no vitals filed for this visit. There is no height or weight on file to calculate BMI.  Generalized: Well developed, flat affect, in no acute distress  Cardiology: normal rate and rhythm, no murmur noted Neurological examination  Mentation: Alert oriented to time, place, history taking. Follows all commands speech and language fluent Cranial nerve II-XII: Pupils were equal round reactive to light. Extraocular movements were full, visual field were full on confrontational test. Facial sensation and strength were normal. Uvula tongue midline. Head turning and shoulder shrug  were normal and symmetric. Motor: The motor testing reveals 5 over 5 strength of all 4 extremities. Good symmetric motor tone is noted throughout. Bradykinesia noted with finger taps and toe taps, no cogwheel rigidity  Sensory: Sensory testing is intact to soft touch on all 4 extremities. No evidence of extinction is noted.  Coordination: Cerebellar testing reveals slow but good finger-nose-finger and heel-to-shin bilaterally.  Gait and station: Gait is shuffeled, uses single point cane, wide turns, spastic  DIAGNOSTIC DATA (LABS, IMAGING, TESTING) - I reviewed patient records, labs, notes, testing and imaging myself where available.  MMSE - Mini Mental State Exam 10/22/2018 05/14/2018  Not completed: (No Data) (No Data)  Orientation to time 5 4  Orientation to Place 3 5  Registration 3 3  Attention/ Calculation 0 0  Recall 3 2  Language- name 2 objects 2 2  Language- repeat 1 1  Language- follow 3 step command 3 3  Language- read & follow direction 1 1  Write a sentence 1 0  Copy design 1 1  Total score 23 22       Lab Results  Component Value Date   WBC 6.7 01/27/2019   HGB 13.6 01/27/2019   HCT 40.0 01/27/2019   MCV 84 01/27/2019   PLT 224 01/27/2019      Component Value Date/Time   NA 141 02/05/2019 1547   K 4.4 02/05/2019 1547   CL 106 02/05/2019 1547   CO2 21 02/05/2019 1547   GLUCOSE 104 (H) 02/05/2019 1547   GLUCOSE 172 (H) 03/08/2016 0545   BUN 37 (H) 02/05/2019 1547   CREATININE 2.08 (H) 02/05/2019 1547   CREATININE 1.53 (H) 10/20/2015 1213   CALCIUM 9.9 02/05/2019 1547   PROT 7.2 01/27/2019 1743   ALBUMIN 4.0 01/27/2019 1743   AST 13 01/27/2019 1743   ALT 4 01/27/2019 1743   ALKPHOS 63 01/27/2019 1743   BILITOT <0.2 01/27/2019 1743   GFRNONAA 31 (L) 02/05/2019 1547   GFRNONAA 46 (L) 10/20/2015 1213   GFRAA 36 (L) 02/05/2019 1547   GFRAA 53 (L) 10/20/2015 1213   Lab Results  Component Value Date   CHOL 159 10/20/2015   HDL 32 (L) 10/20/2015   LDLCALC 66 10/20/2015   LDLDIRECT 85 07/04/2011   TRIG 307 (H) 10/20/2015   CHOLHDL 5.0 10/20/2015   Lab Results  Component Value Date   HGBA1C 6.8 (A) 01/27/2019   Lab Results  Component Value Date   VITAMINB12 835 11/27/2017   Lab Results  Component Value Date   TSH 1.990 07/26/2016     ASSESSMENT AND PLAN 74 y.o. year old male  has a past medical history of Bilateral foot pain, Chronic pain, Diabetes mellitus, Foley catheter in place (since nov 2018, last changed 1 week ago), Hyperlipidemia, Hypertension, Inguinal hernia, Numbness, Parkinson's disease ([place]), Pneumonia (yrs ago), Shortness of breath, and Urinary retention. here with   No diagnosis found.  Ermin continues to have concerns of increased tremor.  Unfortunately, he is a very poor historian and we have had difficulty with him and his caretaker with medication compliance. He is having dyskinesias.  No orders of the defined types were placed in this encounter.  -  We had a long discussion, will need more information on when he takes his medication and  when they notice the dyskinesias. At this time will decrease his medication and see if the dyskinesias improve, may consider Rytary or Amantadine.  -   We have discussed PT referral.    -  We will continue Aricept 10 mg at night.  MMSE is stable at 22 of 30.   -.  Safety and fall precautions given.  I have suggested that he wear his alert necklace at all times but especially when he is alone.   - chronic neck pain, s/p decompression, worsening, need mri cervical spine to evaluate for new myelopathy contributing to abnormal movements and gait disorder  No orders of the defined types were placed  in this encounter.   A total of 40  minutes was spent face-to-face with this patient. Over half this time was spent on counseling patient on the  No diagnosis found. diagnosis and different diagnostic and therapeutic options, counseling and coordination of care, risks ans benefits of management, compliance, or risk factor reduction and education.

## 2019-07-08 DIAGNOSIS — G20 Parkinson's disease: Secondary | ICD-10-CM | POA: Diagnosis not present

## 2019-07-08 DIAGNOSIS — Z9181 History of falling: Secondary | ICD-10-CM | POA: Diagnosis not present

## 2019-07-08 DIAGNOSIS — G8929 Other chronic pain: Secondary | ICD-10-CM | POA: Diagnosis not present

## 2019-07-09 ENCOUNTER — Telehealth: Payer: Self-pay | Admitting: Neurology

## 2019-07-09 NOTE — Telephone Encounter (Signed)
Pt friend felicia called to see if pt could be worked in for a soon apt with MD (requested) advised next available and pt friend declined and requested a message be sent to [employer]

## 2019-07-09 NOTE — Telephone Encounter (Signed)
He has canceled and no showed multiple times, in fact we could dismiss by office guidelines. He just canceled same day yesterday. He can see Tyler Lester or Tyler Lester if they are available but I cannot squeeze him in thanks

## 2019-07-09 NOTE — Telephone Encounter (Signed)
I tried to call Tyler Lester back. Received message that call could not be completed.

## 2019-07-12 NOTE — Telephone Encounter (Signed)
Angie, will you review his records for dismissal?

## 2019-07-13 ENCOUNTER — Encounter: Payer: Self-pay | Admitting: Neurology

## 2019-07-13 DIAGNOSIS — M5441 Lumbago with sciatica, right side: Secondary | ICD-10-CM | POA: Diagnosis not present

## 2019-07-13 DIAGNOSIS — G8929 Other chronic pain: Secondary | ICD-10-CM | POA: Diagnosis not present

## 2019-07-13 DIAGNOSIS — M5442 Lumbago with sciatica, left side: Secondary | ICD-10-CM | POA: Diagnosis not present

## 2019-07-13 DIAGNOSIS — Z9189 Other specified personal risk factors, not elsewhere classified: Secondary | ICD-10-CM | POA: Diagnosis not present

## 2019-07-14 ENCOUNTER — Telehealth: Payer: Self-pay | Admitting: Family Medicine

## 2019-07-14 DIAGNOSIS — G20 Parkinson's disease: Secondary | ICD-10-CM

## 2019-07-14 NOTE — Telephone Encounter (Signed)
Pt needs a new referral for Neurology. He has been dismissed by [employer]. jw

## 2019-07-20 ENCOUNTER — Telehealth: Payer: Self-pay | Admitting: *Deleted

## 2019-07-20 NOTE — Telephone Encounter (Signed)
Received fax from [employer] requesting refill authorization for pts lisinopril 20 mg tablets.  Did not see on pts current med list.Tyler Lester Katharina Caper, CMA

## 2019-07-20 NOTE — Telephone Encounter (Signed)
This medication was discontinued for patient due to hypotension

## 2019-07-21 ENCOUNTER — Ambulatory Visit (INDEPENDENT_AMBULATORY_CARE_PROVIDER_SITE_OTHER): Payer: Medicare Other | Admitting: Family Medicine

## 2019-07-21 ENCOUNTER — Other Ambulatory Visit: Payer: Self-pay

## 2019-07-21 VITALS — BP 152/82 | HR 89 | Wt 189.0 lb

## 2019-07-21 DIAGNOSIS — N1831 Chronic kidney disease, stage 3a: Secondary | ICD-10-CM

## 2019-07-21 DIAGNOSIS — I10 Essential (primary) hypertension: Secondary | ICD-10-CM | POA: Diagnosis not present

## 2019-07-21 DIAGNOSIS — M7989 Other specified soft tissue disorders: Secondary | ICD-10-CM

## 2019-07-21 DIAGNOSIS — I8311 Varicose veins of right lower extremity with inflammation: Secondary | ICD-10-CM | POA: Diagnosis not present

## 2019-07-21 DIAGNOSIS — I952 Hypotension due to drugs: Secondary | ICD-10-CM

## 2019-07-21 DIAGNOSIS — I890 Lymphedema, not elsewhere classified: Secondary | ICD-10-CM | POA: Diagnosis not present

## 2019-07-21 DIAGNOSIS — I8312 Varicose veins of left lower extremity with inflammation: Secondary | ICD-10-CM | POA: Diagnosis not present

## 2019-07-21 DIAGNOSIS — G20 Parkinson's disease: Secondary | ICD-10-CM

## 2019-07-21 NOTE — Patient Instructions (Addendum)
It was great to meet you today! Thank you for letting me participate in your care!  Today, we discussed your leg swelling and I would not recommend restarting Lasix given your history of hypotension. He also has decreased kidney function and Lasix can make it worse and may not help with his leg swelling. However, I did notice today your blood pressure was elevated. Please take your blood pressure at home and record it. Please return in 4 weeks so we can get a baseline for what his blood pressure does and then make further recommendations. If it remains high we may need to restart a blood pressure medication such as Lasix.  I have also placed a referral to Nephrology for his chronic kidney disease. It will be important for him to be seen by a specialist.  He was seen by Dr. Shan Levans on 01/27/2019 He was seen by Dr. Maudie Mercury on 02/05/2019 He was seen by Dr. Grandville Silos on 04/18/2019  Be well, Harolyn Rutherford, DO PGY-3, [facility]

## 2019-07-21 NOTE — Progress Notes (Signed)
SUBJECTIVE:   CHIEF COMPLAINT / HPI:   Leg Swelling Patient presents from vein/lymphedema clinic with his legs wrapped in Unna boots. He is with his care giver and states they were told by the lymphedema clinic to make an appointment to ask why his Lasix was stopped. Care giver is Fransico Him and provides most of the history. She states his legs have chronic swelling but got worse acutely and they went to the clinic. I informed them that per my chart review it looks as if his lasix as well as all his blood pressure medication was stopped due to episodes of symptomatic hypotension. Also, informed both he had chronic kidney disease. Both patient and care giver reacted strongly stated they had never been informed that he had chronic kidney disease. I realigned with the patient and care giver to let them know I am not sure what conversation took place before as this was the first time I was seeing them. Informed them I would be sending him to Nephrology.  PERTINENT  PMH / PSH: Parkinsons dementia  OBJECTIVE:   BP (!) 152/82   Pulse 89   Wt 189 lb (85.7 kg)   SpO2 98%   BMI 27.91 kg/m   Gen: NAD Cardiac: RRR Resp: CTAB Ext: Both LE wrapped in unna boots, placed today  ASSESSMENT/PLAN:   Essential hypertension Given patient baseline eGFR and creatnine he is not a candidate to restart ACEi or ARB. Amlodipine can make LE edema worse but if he continues to be hypertensive it is a consideration. Lasix or HCTZ is also reasonable but doubt we would get great effect as he has CKD stage 3b.Also, his history of symptomatic hypotension makes this a more difficult decision. - Recheck BMP today - Referral to Nephrology  Parkinson's disease [facility]) I have some concern for dementia as patient does not report remembering any conversation from his previous visits. - Should have a full MOCA done at next appointment     Nuala Alpha, [place]

## 2019-07-22 ENCOUNTER — Encounter: Payer: Self-pay | Admitting: Family Medicine

## 2019-07-22 LAB — BASIC METABOLIC PANEL
BUN/Creatinine Ratio: 15 (ref 10–24)
BUN: 28 mg/dL — ABNORMAL HIGH (ref 8–27)
CO2: 18 mmol/L — ABNORMAL LOW (ref 20–29)
Calcium: 9.7 mg/dL (ref 8.6–10.2)
Chloride: 108 mmol/L — ABNORMAL HIGH (ref 96–106)
Creatinine, Ser: 1.83 mg/dL — ABNORMAL HIGH (ref 0.76–1.27)
GFR calc Af Amer: 42 mL/min/{1.73_m2} — ABNORMAL LOW (ref >59)
GFR calc non Af Amer: 36 mL/min/{1.73_m2} — ABNORMAL LOW (ref >59)
Glucose: 84 mg/dL (ref 65–99)
Potassium: 4.5 mmol/L (ref 3.5–5.2)
Sodium: 141 mmol/L (ref 134–144)

## 2019-07-22 NOTE — Progress Notes (Signed)
Sending letter with results to patient. Patient had slight improvement in serum creatinine but overall CKD 3b appears stable. He has been referred to Nephrology.

## 2019-07-24 ENCOUNTER — Ambulatory Visit: Payer: Medicare Other | Admitting: Family Medicine

## 2019-07-24 NOTE — Assessment & Plan Note (Signed)
I have some concern for dementia as patient does not report remembering any conversation from his previous visits. - Should have a full MOCA done at next appointment

## 2019-07-24 NOTE — Assessment & Plan Note (Signed)
Given patient baseline eGFR and creatnine he is not a candidate to restart ACEi or ARB. Amlodipine can make LE edema worse but if he continues to be hypertensive it is a consideration. Lasix or HCTZ is also reasonable but doubt we would get great effect as he has CKD stage 3b.Also, his history of symptomatic hypotension makes this a more difficult decision. - Recheck BMP today - Referral to Nephrology

## 2019-07-28 DIAGNOSIS — L97211 Non-pressure chronic ulcer of right calf limited to breakdown of skin: Secondary | ICD-10-CM | POA: Diagnosis not present

## 2019-07-28 DIAGNOSIS — L97221 Non-pressure chronic ulcer of left calf limited to breakdown of skin: Secondary | ICD-10-CM | POA: Diagnosis not present

## 2019-08-06 ENCOUNTER — Other Ambulatory Visit: Payer: Self-pay | Admitting: Family Medicine

## 2019-08-06 ENCOUNTER — Telehealth: Payer: Self-pay | Admitting: Family Medicine

## 2019-08-06 NOTE — Telephone Encounter (Signed)
Spoke to [employer] over the phone, patient's caregiver. Wanted to reach out to patient and caregiver about blood pressure medications.  I continue to receive refill requests for lisinopril, which was discontinued in the past for hypotension.  Both patient and caregiver are understanding that this medication has now been discontinued. Also denied request for naproxen given CKD 3.  Flu shot also asked about referral to [facility].  Looked under referral tab and informed [name] that the referral was authorized by insurance.  I encouraged her to call [facility] to set up an appointment.  No further questions  Wilber Oliphant, M.D.  9:58 AM 08/06/2019

## 2019-08-12 DIAGNOSIS — I8312 Varicose veins of left lower extremity with inflammation: Secondary | ICD-10-CM | POA: Diagnosis not present

## 2019-08-12 DIAGNOSIS — I8311 Varicose veins of right lower extremity with inflammation: Secondary | ICD-10-CM | POA: Diagnosis not present

## 2019-08-13 DIAGNOSIS — Z9189 Other specified personal risk factors, not elsewhere classified: Secondary | ICD-10-CM | POA: Diagnosis not present

## 2019-08-13 DIAGNOSIS — M5441 Lumbago with sciatica, right side: Secondary | ICD-10-CM | POA: Diagnosis not present

## 2019-08-13 DIAGNOSIS — M5442 Lumbago with sciatica, left side: Secondary | ICD-10-CM | POA: Diagnosis not present

## 2019-08-13 DIAGNOSIS — G8929 Other chronic pain: Secondary | ICD-10-CM | POA: Diagnosis not present

## 2019-08-17 ENCOUNTER — Other Ambulatory Visit: Payer: Self-pay | Admitting: Family Medicine

## 2019-08-20 DIAGNOSIS — R600 Localized edema: Secondary | ICD-10-CM | POA: Diagnosis not present

## 2019-08-20 DIAGNOSIS — I83893 Varicose veins of bilateral lower extremities with other complications: Secondary | ICD-10-CM | POA: Diagnosis not present

## 2019-08-20 DIAGNOSIS — I890 Lymphedema, not elsewhere classified: Secondary | ICD-10-CM | POA: Diagnosis not present

## 2019-08-21 ENCOUNTER — Other Ambulatory Visit: Payer: Self-pay

## 2019-08-21 ENCOUNTER — Ambulatory Visit (INDEPENDENT_AMBULATORY_CARE_PROVIDER_SITE_OTHER): Payer: Medicare Other | Admitting: Family Medicine

## 2019-08-21 ENCOUNTER — Encounter: Payer: Self-pay | Admitting: Family Medicine

## 2019-08-21 ENCOUNTER — Ambulatory Visit
Admission: RE | Admit: 2019-08-21 | Discharge: 2019-08-21 | Disposition: A | Discharge status: Home / Self Care | Payer: Medicare Other | Source: Ambulatory Visit | Attending: Family Medicine | Admitting: Family Medicine

## 2019-08-21 VITALS — BP 134/74 | HR 83 | Ht 69.0 in | Wt 193.2 lb

## 2019-08-21 DIAGNOSIS — M25551 Pain in right hip: Secondary | ICD-10-CM | POA: Diagnosis not present

## 2019-08-21 DIAGNOSIS — M25552 Pain in left hip: Secondary | ICD-10-CM | POA: Diagnosis not present

## 2019-08-21 DIAGNOSIS — M160 Bilateral primary osteoarthritis of hip: Secondary | ICD-10-CM | POA: Diagnosis not present

## 2019-08-21 NOTE — Progress Notes (Signed)
    SUBJECTIVE:  CHIEF COMPLAINT / HPI:   Hip Pain Has been going on for months. Mostly in Right hip. Pain and weakness. No falls. Pain is located at inferior aspect o right buttock mostly, but also on the left as well. His nurse acompanies patient today and reports that he tends to come down on his bottom very hard when he sits down. Patient reports pain with walking and while sitting. He is sitting most of the time as lying down on his back hurts and he is mostly immobile with his progressing Parkinson disease.  Nurse denies any sacral ulcer in the area.   PERTINENT  PMH / PSH: Parkinson disease   OBJECTIVE:  BP 134/74   Pulse 83   Ht 5\' 9"  (1.753 m)   Wt 193 lb 3.2 oz (87.6 kg)   SpO2 97%   BMI 28.53 kg/m   General: well appearing male in wheelchair  MSK: TTP of right ischium. ROM was difficult to assess given patient's baseline Parkinson.   ASSESSMENT/PLAN:  Ischial pain, right Will obtain bilateral x rays of pelvis to rule out fractures. Otherwise, continue conservative care with frequent position changes to avoid pressure ulcers.     Wilber Oliphant, MD [place]

## 2019-08-25 ENCOUNTER — Encounter: Payer: Self-pay | Admitting: Family Medicine

## 2019-08-25 DIAGNOSIS — M25551 Pain in right hip: Secondary | ICD-10-CM | POA: Insufficient documentation

## 2019-08-25 NOTE — Assessment & Plan Note (Signed)
Will obtain bilateral x rays of pelvis to rule out fractures. Otherwise, continue conservative care with frequent position changes to avoid pressure ulcers.

## 2019-09-01 ENCOUNTER — Telehealth: Payer: Self-pay

## 2019-09-01 NOTE — Telephone Encounter (Signed)
Patient's caregiver, [name], calls nurse line regarding X-ray results from 6/25. Informed of result note. Felicia reports that patient is still in considerable pain in his hips. Felicia would like returned phone call for next steps.   To PCP  Talbot Grumbling, RN

## 2019-09-03 ENCOUNTER — Other Ambulatory Visit: Payer: Self-pay | Admitting: Family Medicine

## 2019-09-04 NOTE — Telephone Encounter (Signed)
Spoke to [place] over the phone who reported that patient is still having pain. Would like to see patient in office for further exam. Given his mobility issues, ideally he would be able to be booked for two appointment slots to allow for appropriate physical exam.   Additionally, Patient has been dismissed for [facility]. We have sent neurology referral out and currently awaiting response from [facility]. Felicia reports that patient has been shaking more and more recently and is unable to use the bathroom. Return precautions for the emergency department. [name] is understanding. Will continue to try to get patient in with a neurology group.   Wilber Oliphant, M.D.  5:31 PM 09/04/2019

## 2019-09-08 ENCOUNTER — Other Ambulatory Visit: Payer: Self-pay | Admitting: Family Medicine

## 2019-09-08 MED ORDER — DONEPEZIL HCL 10 MG PO TABS: 10.0000 mg | ORAL_TABLET | Freq: Every day | ORAL | 3 refills | Status: DC

## 2019-09-09 ENCOUNTER — Ambulatory Visit: Payer: Medicare Other | Admitting: Family Medicine

## 2019-09-10 ENCOUNTER — Other Ambulatory Visit: Payer: Self-pay | Admitting: Family Medicine

## 2019-09-10 DIAGNOSIS — G8929 Other chronic pain: Secondary | ICD-10-CM | POA: Diagnosis not present

## 2019-09-10 DIAGNOSIS — M5442 Lumbago with sciatica, left side: Secondary | ICD-10-CM | POA: Diagnosis not present

## 2019-09-10 DIAGNOSIS — Z79899 Other long term (current) drug therapy: Secondary | ICD-10-CM | POA: Diagnosis not present

## 2019-09-10 DIAGNOSIS — M5441 Lumbago with sciatica, right side: Secondary | ICD-10-CM | POA: Diagnosis not present

## 2019-09-15 DIAGNOSIS — G8929 Other chronic pain: Secondary | ICD-10-CM | POA: Diagnosis not present

## 2019-09-15 DIAGNOSIS — G20 Parkinson's disease: Secondary | ICD-10-CM | POA: Diagnosis not present

## 2019-09-15 DIAGNOSIS — Z9181 History of falling: Secondary | ICD-10-CM | POA: Diagnosis not present

## 2019-09-16 ENCOUNTER — Other Ambulatory Visit: Payer: Self-pay | Admitting: Family Medicine

## 2019-09-16 ENCOUNTER — Ambulatory Visit (INDEPENDENT_AMBULATORY_CARE_PROVIDER_SITE_OTHER): Payer: Medicare Other | Admitting: Family Medicine

## 2019-09-16 ENCOUNTER — Encounter: Payer: Self-pay | Admitting: Family Medicine

## 2019-09-16 ENCOUNTER — Other Ambulatory Visit: Payer: Self-pay

## 2019-09-16 VITALS — BP 114/56 | HR 84 | Wt 192.5 lb

## 2019-09-16 DIAGNOSIS — E118 Type 2 diabetes mellitus with unspecified complications: Secondary | ICD-10-CM

## 2019-09-16 DIAGNOSIS — M25551 Pain in right hip: Secondary | ICD-10-CM | POA: Diagnosis not present

## 2019-09-16 LAB — POCT GLYCOSYLATED HEMOGLOBIN (HGB A1C): HbA1c, POC (controlled diabetic range): 5.9 % (ref 0.0–7.0)

## 2019-09-16 NOTE — Progress Notes (Signed)
    SUBJECTIVE:   CHIEF COMPLAINT / HPI:   Ischial pain R>L Spoke with Felicia on the phone on 09/04/19 who reported that patient continued to have pain. Asked her to schedule two appointment spaces as physical exam may be challenging given patient's Parkinson's disease. Xray on 08/21/19 was normal with not fractures. No shooting pain down the legs as experienced with chronic sciatica. Patient went to urologist office yesterday to have prostate checked which was normal and without issues. His pain worsens with walking and he reports having to stop and take a break as the pain becomes significant. It hurts to sit on the area as well.   Dysmorphic Nails  He would also like to have his toe-nails cut today. He reports that he has had him nails trimmed here previously with Dr. Grandville Silos. Felicia reports that she is not allowed to do this at home under her credentials due to his diabetic neuropathy.   PERTINENT  PMH / PSH: BPH w/ urinary retention, CKD 3  OBJECTIVE:   BP (!) 114/56   Pulse 84   Wt 192 lb 8 oz (87.3 kg)   SpO2 96%   BMI 28.43 kg/m   General: well appearing, resting pill rolling tremor bilaterally.  Hands: overgrown thick, mildly discolored fingernails bilaterally  Hips: with patient facing exam table, palpation of right muscle mass attached to ischial tuberosity. Muscle spasm continues nferiorly along muscle body to medial knee. No subjective shooting pain with palpation along the muscle body.   ASSESSMENT/PLAN:   Ischial pain, right Continued pain in ischium R>L. TTP of muscular gluteal area. Muscular spasm palpated at ischial tuberosity which is anatomically consistent with semimembranous origin. Muscular spasm continues inferiorly along muscle body to medial knee. This is likely due in part to spasticity and decreased range of movement related to PD. Fracture ruled out with x ray. Patient is ammenable to physical therapy for massage and stretches to help the area. Patient also  reports that he has not been taking his Sinimet since February and I wonder if this could have also exacerbated the issue. Only mild symptoms of left side. Patient has been approved for neurology referral and will hopefully be seen for this soon.    Nail trimming tolerated well. No complications.   Patient taken off of medications due to kidney disease, though no documentation of this neurology notes. Though, long history of difficulty with medication management which is apparent today as well, as neither patient nor nurse really know what rx he is taking at home. He usually takes his medication prior to Felicia's arrival. HTN medications d/c'ed at [facility]. Will obtain labs today for risk stratification as patient would benefit from 1' and 2' prevention.   Wilber Oliphant, MD [place]

## 2019-09-16 NOTE — Patient Instructions (Addendum)
divvyDOSE Tyler Lester, [place] (Ph: [phone])   We discussed multiple things today. 1. We discussed your relationship with neurology over the past few years and what were doing next from a neurologic point of view for the Parkinson disease.  It looks like your referral to a new neurologist has been authorized.  They should call you for an appointment soon 2. We talked about your medications.  The only medications that you are listed as taking right now is Aricept and Percocet.  It looks like your Sinemet medication expired in February.  Be Aricept is also called donepezil.  This medication is used for memory loss or dementia.  The Sinemet is also called carbidopa levodopa.  This medicine is used for his Parkinson symptoms.  The Percocet is prescribed by Dr. Nancy Fetter who manages his pain. 3. We trimmed his fingernails today as requested 4. We talked about how we want to manage his chronic diseases in the setting of his Parkinson disease.  At this time, I will get some blood tests to make sure that his chronic diseases are not worsening without any medication at this time.  It looks like his labs have been stable for the last several months.  5. For your muscle pain in your butt, I would like you to do some exercises with Elmo Putt at home.  Additionally, I am sending a physical therapy referral so that they can help massage the area.  Because of the Parkinson Disease, we may be limited on options for treatment besides physical therapy. Though this would be a great question to ask your new neurologist.  6. Please schedule a follow up in one month so that we can keep a close eye on your chronic diseases and Parkinson Disease.   Best,   Dr. Maudie Mercury

## 2019-09-17 ENCOUNTER — Telehealth: Payer: Self-pay | Admitting: Family Medicine

## 2019-09-17 ENCOUNTER — Encounter: Payer: Self-pay | Admitting: Family Medicine

## 2019-09-17 LAB — CBC
Hematocrit: 46 % (ref 37.5–51.0)
Hemoglobin: 14.3 g/dL (ref 13.0–17.7)
MCH: 26.5 pg — ABNORMAL LOW (ref 26.6–33.0)
MCHC: 31.1 g/dL — ABNORMAL LOW (ref 31.5–35.7)
MCV: 85 fL (ref 79–97)
Platelets: 186 10*3/uL (ref 150–450)
RBC: 5.39 x10E6/uL (ref 4.14–5.80)
RDW: 12.8 % (ref 11.6–15.4)
WBC: 7.6 10*3/uL (ref 3.4–10.8)

## 2019-09-17 LAB — BASIC METABOLIC PANEL
BUN/Creatinine Ratio: 13 (ref 10–24)
BUN: 20 mg/dL (ref 8–27)
CO2: 22 mmol/L (ref 20–29)
Calcium: 9.8 mg/dL (ref 8.6–10.2)
Chloride: 106 mmol/L (ref 96–106)
Creatinine, Ser: 1.58 mg/dL — ABNORMAL HIGH (ref 0.76–1.27)
GFR calc Af Amer: 50 mL/min/{1.73_m2} — ABNORMAL LOW (ref >59)
GFR calc non Af Amer: 43 mL/min/{1.73_m2} — ABNORMAL LOW (ref >59)
Glucose: 124 mg/dL — ABNORMAL HIGH (ref 65–99)
Potassium: 4.5 mmol/L (ref 3.5–5.2)
Sodium: 141 mmol/L (ref 134–144)

## 2019-09-17 NOTE — Telephone Encounter (Signed)
Patient calling about his PT referral. He said someone called him and said he would have to get a different referral as he needs in home PT. Please call patient back to discuss.

## 2019-09-17 NOTE — Assessment & Plan Note (Signed)
Continued pain in ischium R>L. TTP of muscular gluteal area. Muscular spasm palpated at ischial tuberosity which is anatomically consistent with semimembranous origin. Muscular spasm continues inferiorly along muscle body to medial knee. This is likely due in part to spasticity and decreased range of movement related to PD. Fracture ruled out with x ray. Patient is ammenable to physical therapy for massage and stretches to help the area. Patient also reports that he has not been taking his Sinimet since February and I wonder if this could have also exacerbated the issue. Only mild symptoms of left side. Patient has been approved for neurology referral and will hopefully be seen for this soon.

## 2019-09-18 ENCOUNTER — Telehealth: Payer: Self-pay

## 2019-09-18 NOTE — Telephone Encounter (Signed)
Felicia calls nurse line stating she does not want Aricept to go to [facility], [name] reports "we are shying away from them." Felicia reports they have not received the prescription from them as she told them to "hold it." [name] is requesting this to be called in to [facility] on [address]. I called [facility] to clarify this medication was not mailed out to patient. Per representative, the medication was delivered and confirmed by postal service on 7/17. I called Felicia back to check on this, per [name] "there is a package he has not opened, I bet its in there."  [name] is to call me back if medication is not in the package.

## 2019-09-18 NOTE — Telephone Encounter (Signed)
Patient called back and has changed his mind about the referral and would like to be seen at the physical therapy location and not at home.  [facility].  Nance Mccombs,CMA

## 2019-09-19 LAB — LIPID PANEL
Chol/HDL Ratio: 4.1 ratio (ref 0.0–5.0)
Cholesterol, Total: 145 mg/dL (ref 100–199)
HDL: 35 mg/dL — ABNORMAL LOW (ref >39)
LDL Chol Calc (NIH): 90 mg/dL (ref 0–99)
Triglycerides: 108 mg/dL (ref 0–149)
VLDL Cholesterol Cal: 20 mg/dL (ref 5–40)

## 2019-09-19 LAB — SPECIMEN STATUS REPORT

## 2019-09-24 ENCOUNTER — Telehealth: Payer: Self-pay | Admitting: Family Medicine

## 2019-09-24 DIAGNOSIS — G20 Parkinson's disease: Secondary | ICD-10-CM

## 2019-09-24 NOTE — Telephone Encounter (Signed)
Patient is requesting a walker with the seat on it. Needing doctor to send in order, he called home care people and they told him to call doctor to get an order for it placed. Please contact patient. [phone]. Thanks

## 2019-09-25 ENCOUNTER — Encounter (HOSPITAL_COMMUNITY): Payer: Self-pay

## 2019-09-25 ENCOUNTER — Ambulatory Visit (HOSPITAL_COMMUNITY)
Admission: EM | Admit: 2019-09-25 | Discharge: 2019-09-25 | Disposition: A | Discharge status: Home / Self Care | Payer: Medicare Other

## 2019-09-25 ENCOUNTER — Emergency Department (HOSPITAL_COMMUNITY): Payer: Medicare Other

## 2019-09-25 ENCOUNTER — Other Ambulatory Visit: Payer: Self-pay

## 2019-09-25 ENCOUNTER — Emergency Department (HOSPITAL_COMMUNITY)
Admission: EM | Admit: 2019-09-25 | Discharge: 2019-09-25 | Disposition: A | Discharge status: Home / Self Care | Payer: Medicare Other | Attending: Emergency Medicine | Admitting: Emergency Medicine

## 2019-09-25 DIAGNOSIS — Z79899 Other long term (current) drug therapy: Secondary | ICD-10-CM | POA: Insufficient documentation

## 2019-09-25 DIAGNOSIS — J984 Other disorders of lung: Secondary | ICD-10-CM | POA: Diagnosis not present

## 2019-09-25 DIAGNOSIS — I5032 Chronic diastolic (congestive) heart failure: Secondary | ICD-10-CM | POA: Insufficient documentation

## 2019-09-25 DIAGNOSIS — E785 Hyperlipidemia, unspecified: Secondary | ICD-10-CM | POA: Insufficient documentation

## 2019-09-25 DIAGNOSIS — R531 Weakness: Secondary | ICD-10-CM | POA: Diagnosis not present

## 2019-09-25 DIAGNOSIS — I10 Essential (primary) hypertension: Secondary | ICD-10-CM | POA: Diagnosis not present

## 2019-09-25 DIAGNOSIS — E1140 Type 2 diabetes mellitus with diabetic neuropathy, unspecified: Secondary | ICD-10-CM | POA: Diagnosis not present

## 2019-09-25 DIAGNOSIS — E1122 Type 2 diabetes mellitus with diabetic chronic kidney disease: Secondary | ICD-10-CM | POA: Insufficient documentation

## 2019-09-25 DIAGNOSIS — Z833 Family history of diabetes mellitus: Secondary | ICD-10-CM | POA: Diagnosis not present

## 2019-09-25 DIAGNOSIS — Z8249 Family history of ischemic heart disease and other diseases of the circulatory system: Secondary | ICD-10-CM | POA: Diagnosis not present

## 2019-09-25 DIAGNOSIS — Z743 Need for continuous supervision: Secondary | ICD-10-CM | POA: Diagnosis not present

## 2019-09-25 DIAGNOSIS — G20 Parkinson's disease: Secondary | ICD-10-CM | POA: Insufficient documentation

## 2019-09-25 DIAGNOSIS — I130 Hypertensive heart and chronic kidney disease with heart failure and stage 1 through stage 4 chronic kidney disease, or unspecified chronic kidney disease: Secondary | ICD-10-CM | POA: Insufficient documentation

## 2019-09-25 DIAGNOSIS — N183 Chronic kidney disease, stage 3 unspecified: Secondary | ICD-10-CM | POA: Insufficient documentation

## 2019-09-25 DIAGNOSIS — M549 Dorsalgia, unspecified: Secondary | ICD-10-CM | POA: Insufficient documentation

## 2019-09-25 DIAGNOSIS — R29818 Other symptoms and signs involving the nervous system: Secondary | ICD-10-CM | POA: Diagnosis not present

## 2019-09-25 DIAGNOSIS — G8929 Other chronic pain: Secondary | ICD-10-CM | POA: Diagnosis not present

## 2019-09-25 DIAGNOSIS — R52 Pain, unspecified: Secondary | ICD-10-CM | POA: Diagnosis not present

## 2019-09-25 DIAGNOSIS — M542 Cervicalgia: Secondary | ICD-10-CM | POA: Diagnosis not present

## 2019-09-25 DIAGNOSIS — R29810 Facial weakness: Secondary | ICD-10-CM | POA: Diagnosis not present

## 2019-09-25 LAB — PROTIME-INR
INR: 1.1 (ref 0.8–1.2)
Prothrombin Time: 13.8 seconds (ref 11.4–15.2)

## 2019-09-25 LAB — COMPREHENSIVE METABOLIC PANEL
ALT: 13 U/L (ref 0–44)
AST: 17 U/L (ref 15–41)
Albumin: 3.3 g/dL — ABNORMAL LOW (ref 3.5–5.0)
Alkaline Phosphatase: 45 U/L (ref 38–126)
Anion gap: 8 (ref 5–15)
BUN: 18 mg/dL (ref 8–23)
CO2: 24 mmol/L (ref 22–32)
Calcium: 8.7 mg/dL — ABNORMAL LOW (ref 8.9–10.3)
Chloride: 107 mmol/L (ref 98–111)
Creatinine, Ser: 1.75 mg/dL — ABNORMAL HIGH (ref 0.61–1.24)
GFR calc Af Amer: 44 mL/min — ABNORMAL LOW (ref >60)
GFR calc non Af Amer: 38 mL/min — ABNORMAL LOW (ref >60)
Glucose, Bld: 140 mg/dL — ABNORMAL HIGH (ref 70–99)
Potassium: 3.8 mmol/L (ref 3.5–5.1)
Sodium: 139 mmol/L (ref 135–145)
Total Bilirubin: 0.8 mg/dL (ref 0.3–1.2)
Total Protein: 6.5 g/dL (ref 6.5–8.1)

## 2019-09-25 LAB — RAPID URINE DRUG SCREEN, HOSP PERFORMED
Amphetamines: NOT DETECTED
Barbiturates: NOT DETECTED
Benzodiazepines: NOT DETECTED
Cocaine: NOT DETECTED
Opiates: NOT DETECTED
Tetrahydrocannabinol: NOT DETECTED

## 2019-09-25 LAB — URINALYSIS, ROUTINE W REFLEX MICROSCOPIC
Bilirubin Urine: NEGATIVE
Glucose, UA: NEGATIVE mg/dL
Hgb urine dipstick: NEGATIVE
Ketones, ur: NEGATIVE mg/dL
Leukocytes,Ua: NEGATIVE
Nitrite: NEGATIVE
Protein, ur: NEGATIVE mg/dL
Specific Gravity, Urine: 1.014 (ref 1.005–1.030)
pH: 6 (ref 5.0–8.0)

## 2019-09-25 LAB — DIFFERENTIAL
Abs Immature Granulocytes: 0.02 10*3/uL (ref 0.00–0.07)
Basophils Absolute: 0 10*3/uL (ref 0.0–0.1)
Basophils Relative: 0 %
Eosinophils Absolute: 0.1 10*3/uL (ref 0.0–0.5)
Eosinophils Relative: 1 %
Immature Granulocytes: 0 %
Lymphocytes Relative: 26 %
Lymphs Abs: 1.8 10*3/uL (ref 0.7–4.0)
Monocytes Absolute: 0.6 10*3/uL (ref 0.1–1.0)
Monocytes Relative: 8 %
Neutro Abs: 4.4 10*3/uL (ref 1.7–7.7)
Neutrophils Relative %: 65 %

## 2019-09-25 LAB — I-STAT CHEM 8, ED
BUN: 21 mg/dL (ref 8–23)
Calcium, Ion: 1.15 mmol/L (ref 1.15–1.40)
Chloride: 106 mmol/L (ref 98–111)
Creatinine, Ser: 1.5 mg/dL — ABNORMAL HIGH (ref 0.61–1.24)
Glucose, Bld: 102 mg/dL — ABNORMAL HIGH (ref 70–99)
HCT: 41 % (ref 39.0–52.0)
Hemoglobin: 13.9 g/dL (ref 13.0–17.0)
Potassium: 3.5 mmol/L (ref 3.5–5.1)
Sodium: 145 mmol/L (ref 135–145)
TCO2: 24 mmol/L (ref 22–32)

## 2019-09-25 LAB — CBC
HCT: 44.6 % (ref 39.0–52.0)
Hemoglobin: 13.7 g/dL (ref 13.0–17.0)
MCH: 27.2 pg (ref 26.0–34.0)
MCHC: 30.7 g/dL (ref 30.0–36.0)
MCV: 88.5 fL (ref 80.0–100.0)
Platelets: 173 10*3/uL (ref 150–400)
RBC: 5.04 MIL/uL (ref 4.22–5.81)
RDW: 13 % (ref 11.5–15.5)
WBC: 6.8 10*3/uL (ref 4.0–10.5)
nRBC: 0 % (ref 0.0–0.2)

## 2019-09-25 LAB — APTT: aPTT: 32 seconds (ref 24–36)

## 2019-09-25 MED ORDER — FENTANYL CITRATE (PF) 100 MCG/2ML IJ SOLN
25.0000 ug | Freq: Once | INTRAMUSCULAR | Status: AC
  Administered 2019-09-25: 25 ug via INTRAVENOUS
  Filled 2019-09-25: qty 2

## 2019-09-25 MED ORDER — SODIUM CHLORIDE 0.9 % IV BOLUS
500.0000 mL | Freq: Once | INTRAVENOUS | Status: AC
  Administered 2019-09-25: 500 mL via INTRAVENOUS

## 2019-09-25 MED ORDER — FENTANYL CITRATE (PF) 100 MCG/2ML IJ SOLN
50.0000 ug | Freq: Once | INTRAMUSCULAR | Status: AC
  Administered 2019-09-25: 50 ug via INTRAVENOUS
  Filled 2019-09-25: qty 2

## 2019-09-25 MED ORDER — LORAZEPAM 2 MG/ML IJ SOLN
0.5000 mg | Freq: Once | INTRAMUSCULAR | Status: DC
  Filled 2019-09-25: qty 1

## 2019-09-25 NOTE — ED Notes (Signed)
Patient is being discharged from the Urgent Care and sent to the Emergency Department via EMS . Per Roland Rack, PA, patient is in need of higher level of care due to stroke like symptoms. Patient is aware and verbalizes understanding of plan of care.  Vitals:   09/25/19 1627  BP: (!) 150/69  Pulse: 102  Resp: 16  Temp: 98.8 F (37.1 C)  SpO2: 100%

## 2019-09-25 NOTE — ED Provider Notes (Signed)
[facility] EMERGENCY DEPARTMENT Provider Note   CSN: [id number] Arrival date & time: 09/25/19  1706   History Chief Complaint  Patient presents with  . Weakness   Tyler Lester is a 74 y.o. male with past medical history significant for Parkinson's, diabetes, hypertension, hyperlipidemia, chronic back pain who presents for evaluation of weakness primarily unilateral however seems to have some degree of bilateral lower extremity weakness. LKN yesterday morning >28 hours PTA. Initially told UC LKN 2100 yesterday however caregiver states confused x 2 days Unable to walk which he is able to do at baseline per caretaker. Tremors at baseline which has not increased.  No sudden onset headache, no dizziness, no chest pain no shortness of breath.   Caretaker in room states patient does not seem like himself.  Seems disoriented at times.  Caretaker states he has had facial droop as well as new onset left arm weakness which she does not have at baseline.  No new medication changes.  History obtained from patient, few members in room, past medical records.  No interpreter is used.  HPI     Past Medical History:  Diagnosis Date  . Bilateral foot pain   . Chronic pain   . Diabetes mellitus    type 2, diet controlled now  . Foley catheter in place since nov 2018, last changed 1 week ago  . Hyperlipidemia   . Hypertension   . Inguinal hernia    right  . Numbness    T/O  . Parkinson disease ([place]) 08/26/2015   Followed by [facility]. Last visit 06/03/2015. History of poor compliance with his medication. Referred him back to [facility] in 10/2016. They didn't want to see him back due to poor compliance with medical advise. Last plan from 06/03/2015 still valid  . Parkinson's disease ([place])   . Pneumonia yrs ago  . Shortness of breath    with exertion  . Urinary retention    Patient Active Problem List   Diagnosis Date Noted  . Ischial pain, right 08/25/2019  . Palpitations 11/24/2018  .  Onychomycosis 08/05/2017  . Chronic bilateral low back pain with bilateral sciatica 01/30/2017  . Memory change 11/09/2016  . Hyperlipidemia 11/05/2016  . Diabetic polyneuropathy associated with type 2 diabetes mellitus ([place]) 04/13/2016  . 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 Diminished hearing, left 01/20/2016  . Type 2 diabetes mellitus with complication ([place]) 56/31/4970  . Gait abnormality 03/28/2015  . Postlaminectomy syndrome, cervical region 07/23/2014  . Parkinson's disease ([place]) 07/23/2014  . Back pain with sciatica 05/27/2014  . Bilateral leg pain 05/14/2014  . Depression 10/15/2013  . CKD (chronic kidney disease) stage 3, GFR 30-59 ml/min 08/25/2013  . Tremor 05/23/2013  . Chronic diastolic heart failure ([place]) 05/06/2013  . Onychogryposis of toenail 05/14/2012  . GERD (gastroesophageal reflux disease) 07/04/2011  . Essential hypertension 01/05/2011    Past Surgical History:  Procedure Laterality Date  . CATARACT EXTRACTION W/ INTRAOCULAR LENS IMPLANT Left 06/2012   laser for posterior capsule?  . COLON SURGERY    . POSTERIOR CERVICAL FUSION/FORAMINOTOMY N/A 11/12/2013   Procedure: Posterior cervical decompression fusion, cervical 3-4, cervical 4-5, cervical 5-6, cervical 6-7 with instrumentation and allograft   (LEVEL 4);  Surgeon: Sinclair Ship, MD;  Location: [facility];  Service: Orthopedics;  Laterality: N/A;  Posterior cervical decompression fusion, cervical 3-4, cervical 4-5, cervical 5-6, cervical 6-7 with instrumentation and allograft  . TONSILLECTOMY    . XI ROBOTIC ASSISTED SIMPLE PROSTATECTOMY  N/A 03/07/2016   Procedure: XI ROBOTIC ASSISTED SIMPLE PROSTATECTOMY AND UMBILICAL HERNIA REPAIR flexible cystoscopy;  Surgeon: Alexis Frock, MD;  Location: [facility];  Service: Urology;  Laterality: N/A;       Family History  Problem Relation Age of Onset  . Cancer - Other Other   . Diabetes Other   . Hypertension Other     Social History    Tobacco Use  . Smoking status: Never Smoker  . Smokeless tobacco: Never Used  Vaping Use  . Vaping Use: Never used  Substance Use Topics  . Alcohol use: Yes    Alcohol/week: 0.0 standard drinks    Comment: 1 shot a day  . Drug use: No    Home Medications Prior to Admission medications   Medication Sig Start Date End Date Taking? Authorizing Provider  carbidopa-levodopa (SINEMET) 25-100 MG tablet Take 1-1.5 tablets by mouth 4 (four) times daily. Take 3-4 hours apart such at 8am, 11-12am, 2-4pm and 6-8pm for example. Avoid high protein diet. 01/26/19 04/26/19  Melvenia Beam, MD  donepezil (ARICEPT) 10 MG tablet Take 1 tablet (10 mg total) by mouth daily. 09/08/19   Wilber Oliphant, MD  oxyCODONE-acetaminophen (PERCOCET) 7.5-325 MG tablet Take 1 tablet by mouth 4 (four) times daily as needed. 09/10/19   [provider]    Allergies    Poison ivy treatments  Review of Systems   Review of Systems  Constitutional: Negative.   HENT: Negative.   Respiratory: Negative.   Cardiovascular: Negative.   Gastrointestinal: Negative.   Genitourinary: Negative.   Musculoskeletal: Positive for back pain (Chronic) and gait problem. Negative for arthralgias, joint swelling, myalgias, neck pain and neck stiffness.  Skin: Negative.   Neurological: Positive for facial asymmetry and weakness. Negative for dizziness, tremors, seizures, syncope, speech difficulty, light-headedness, numbness and headaches.  All other systems reviewed and are negative.   Physical Exam Updated Vital Signs BP (!) 134/82   Pulse 87   Temp 98.9 F (37.2 C) (Oral)   Resp 15   SpO2 99%   Physical Exam Vitals and nursing note reviewed.  Constitutional:      General: He is not in acute distress.    Appearance: He is well-developed. He is ill-appearing (Chronically ill appearing). He is not toxic-appearing or diaphoretic.  HENT:     Head: Normocephalic and atraumatic.     Jaw: There is normal jaw occlusion.      Mouth/Throat:     Lips: Pink.     Mouth: Mucous membranes are dry.     Pharynx: Oropharynx is clear. Uvula midline.     Comments: Mucous membranes dry. Tongue midline Eyes:     Extraocular Movements: Extraocular movements intact.     Conjunctiva/sclera: Conjunctivae normal.     Pupils: Pupils are equal, round, and reactive to light.     Comments: PERRLA  Neck:     Trachea: Phonation normal.  Cardiovascular:     Rate and Rhythm: Normal rate and regular rhythm.     Pulses: Normal pulses.          Radial pulses are 2+ on the right side and 2+ on the left side.       Dorsalis pedis pulses are 2+ on the right side and 2+ on the left side.     Heart sounds: Normal heart sounds.  Pulmonary:     Effort: Pulmonary effort is normal. No respiratory distress.     Breath sounds: Normal breath sounds and air entry.  Chest:     Comments: Equal chest rise and fall Abdominal:     General: Bowel sounds are normal. There is no distension.     Palpations: Abdomen is soft.     Tenderness: There is abdominal tenderness. There is no right CVA tenderness, left CVA tenderness, guarding or rebound. Negative signs include Murphy's sign and McBurney's sign.     Hernia: No hernia is present.     Comments: Soft non tender   Musculoskeletal:        General: Normal range of motion.     Cervical back: Full passive range of motion without pain, normal range of motion and neck supple.     Comments: Tremors to BL upper and lower extremities. Compartments soft  Skin:    General: Skin is warm and dry.     Comments: Venous stasis skin changes to BLE with 2+ pitting edema to extremities  Neurological:     Mental Status: He is confused.     Comments: Mild facial droop to left. Tremors bilaterally. Tongue midline Equal shoulder shrug Equal hand grip bilaterally however very weak 3/5 strength Intact sensation bilaterally Unable to ambulate secondary to pain Unable to perform Rhomberg, finger to nose to heel to  shin 2/2 generalized weakness Able to repeat no if's ands or buts Alert to person, place not time. States 2001      ED Results / Procedures / Treatments   Labs (all labs ordered are listed, but only abnormal results are displayed) Labs Reviewed  COMPREHENSIVE METABOLIC PANEL - Abnormal; Notable for the following components:      Result Value   Glucose, Bld 140 (*)    Creatinine, Ser 1.75 (*)    Calcium 8.7 (*)    Albumin 3.3 (*)    GFR calc non Af Amer 38 (*)    GFR calc Af Amer 44 (*)    All other components within normal limits  I-STAT CHEM 8, ED - Abnormal; Notable for the following components:   Creatinine, Ser 1.50 (*)    Glucose, Bld 102 (*)    All other components within normal limits  URINE CULTURE  PROTIME-INR  APTT  CBC  DIFFERENTIAL  ETHANOL  RAPID URINE DRUG SCREEN, HOSP PERFORMED  URINALYSIS, ROUTINE W REFLEX MICROSCOPIC    EKG EKG Interpretation  Date/Time:  Friday September 25 2019 17:29:07 EDT Ventricular Rate:  89 PR Interval:    QRS Duration: 100 QT Interval:  402 QTC Calculation: 490 R Axis:   7 Text Interpretation: Sinus rhythm Artifact in lead(s) I III aVL Otherwise normal ecg Confirmed by Veryl Speak [id number]) on 09/25/2019 5:48:37 PM   Radiology CT HEAD WO CONTRAST  Result Date: 09/25/2019 CLINICAL DATA:  Neuro deficit, acute, stroke suspected, facial droop, weakness for greater than 24 hours. EXAM: CT HEAD WITHOUT CONTRAST TECHNIQUE: Contiguous axial images were obtained from the base of the skull through the vertex without intravenous contrast. COMPARISON:  Prior head CT examinations 03/26/2014 and earlier. FINDINGS: Brain: Cerebral volume is normal. There is no acute intracranial hemorrhage. No demarcated cortical infarct is identified. No extra-axial fluid collection. No evidence of intracranial mass. No midline shift. Vascular: No hyperdense vessel. Skull: Normal. Negative for fracture or focal lesion. Sinuses/Orbits: Visualized orbits show no  acute finding. Mild ethmoid sinus mucosal thickening. No significant mastoid effusion IMPRESSION: Unremarkable non-contrast CT appearance of the brain. No evidence of acute intracranial abnormality. Mild ethmoid sinus mucosal thickening. Electronically Signed   By: Kellie Simmering DO  On: 09/25/2019 18:29   Procedures Procedures (including critical care time)  Medications Ordered in ED Medications  LORazepam (ATIVAN) injection 0.5 mg (has no administration in time range)  sodium chloride 0.9 % bolus 500 mL (500 mLs Intravenous New Bag/Given 09/25/19 1855)  fentaNYL (SUBLIMAZE) injection 50 mcg (50 mcg Intravenous Given 09/25/19 1853)   ED Course  I have reviewed the triage vital signs and the nursing notes.  Pertinent labs & imaging results that were available during my care of the patient were reviewed by me and considered in my medical decision making (see chart for details).  74 year old history of Parkinson's presents for evaluation of weakness and facial droop. Came from urgent care. Initially told the last known normal 9 PM yesterday evening however according to caregiver patient has been confused for greater than 2 days. Thinks he has facial asymmetry to left side and generalized bilateral weakness however almost to the left. He is unable to walk which he is normally able to at baseline. Chronic bilateral lower back pain. Unable to perform Romberg, finger-nose, heel-to-shin secondary to weakness. Unable to ambulate. Mild left-sided facial droop however significant tremors to bilateral upper and lower extremities due to Parkinson's. Tongue midline. Patient is seen by attending, Dr. Stark Jock. Does not meet code stroke. Will initiate further work-up and reassess.  Labs and imaging personally reviewed and interpreted: CBC without leukocytosis CMP with hyperglycemia to 140, creatinine 1.75 at baseline, GFR 44 UA pending CT head without acute findings DG chest pending  Discussed findings with  patient and caregiver.  We will proceed with MRI brain as well as UA.  Care transferred to [place], [place]- [facility] who will follow up on remaining, labs and imaging and determine disposition    MDM Rules/Calculators/A&P                           Final Clinical Impression(s) / ED Diagnoses Final diagnoses:  Weakness    Rx / DC Orders ED Discharge Orders    None       Livi Mcgann A, PA-C 09/25/19 Armond Hang, MD 09/25/19 2232

## 2019-09-25 NOTE — Discharge Instructions (Signed)
Please follow up with neurology for management of your Parkinson's disease Continue your pain medicine as prescribed Please discuss possibly obtaining MRI of the C-spine (neck) to evaluate your arm and leg pain Return to the ER for worsening symptoms

## 2019-09-25 NOTE — ED Notes (Signed)
Patient transported to MRI 

## 2019-09-25 NOTE — ED Triage Notes (Signed)
Patient presents to the ED via [facility] with c/o L sided weakness/ numbness. LKW was 2100 last night.

## 2019-09-25 NOTE — ED Provider Notes (Signed)
[place]    CSN: [id number] Arrival date & time: 09/25/19  1617      History   Chief Complaint Chief Complaint  Patient presents with  . Weakness    HPI MILBERT BIXLER is a 74 y.o. male.   Patient with history of Parkinson's disease is brought by family member for evaluation of weakness.  Reported the patient began having weakness probably on the left side last night around 9 PM.  Since then he has been unable to get up on his own which is a new finding.  He also reports having left arm weakness.  Patient's family member states that he has not seen his self and seems a little disoriented at times.  Denies chest pain or shortness of breath.  No severe headache.  No dizziness.  No increase in tremor.    Patient seen in triage by provider.     Past Medical History:  Diagnosis Date  . Bilateral foot pain   . Chronic pain   . Diabetes mellitus    type 2, diet controlled now  . Foley catheter in place since nov 2018, last changed 1 week ago  . Hyperlipidemia   . Hypertension   . Inguinal hernia    right  . Numbness    T/O  . Parkinson disease ([place]) 08/26/2015   Follow[facility] by [facility]. Last visit 06/03/2015. History of poor compliance with his medication. Referred him back to [facility] in 10/2016. They didn't want to see him back due to poor compliance with medical advise. Last plan from 06/03/2015 still valid  . Parkinson's disease ([place])   . Pneumonia yrs ago  . Shortness of breath    with exertion  . Urinary retention     Patient Active Problem List   Diagnosis Date Noted  . Ischial pain, right 08/25/2019  . Palpitations 11/24/2018  . Onychomycosis 08/05/2017  . Chronic bilateral low back pain with bilateral sciatica 01/30/2017  . Memory change 11/09/2016  . Hyperlipidemia 11/05/2016  . Diabetic polyneuropathy associated with type 2 diabetes mellitus ([place]) 04/13/2016  . 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 Diminished hearing, left 01/20/2016  . Type  2 diabetes mellitus with complication ([place]) 78/29/5621  . Gait abnormality 03/28/2015  . Postlaminectomy syndrome, cervical region 07/23/2014  . Parkinson's disease ([place]) 07/23/2014  . Back pain with sciatica 05/27/2014  . Bilateral leg pain 05/14/2014  . Depression 10/15/2013  . CKD (chronic kidney disease) stage 3, GFR 30-59 ml/min 08/25/2013  . Tremor 05/23/2013  . Chronic diastolic heart failure ([place]) 05/06/2013  . Onychogryposis of toenail 05/14/2012  . GERD (gastroesophageal reflux disease) 07/04/2011  . Essential hypertension 01/05/2011    Past Surgical History:  Procedure Laterality Date  . CATARACT EXTRACTION W/ INTRAOCULAR LENS IMPLANT Left 06/2012   laser for posterior capsule?  . COLON SURGERY    . POSTERIOR CERVICAL FUSION/FORAMINOTOMY N/A 11/12/2013   Procedure: Posterior cervical decompression fusion, cervical 3-4, cervical 4-5, cervical 5-6, cervical 6-7 with instrumentation and allograft   (LEVEL 4);  Surgeon: Sinclair Ship, MD;  Location: [facility];  Service: Orthopedics;  Laterality: N/A;  Posterior cervical decompression fusion, cervical 3-4, cervical 4-5, cervical 5-6, cervical 6-7 with instrumentation and allograft  . TONSILLECTOMY    . XI ROBOTIC ASSISTED SIMPLE PROSTATECTOMY N/A 03/07/2016   Procedure: XI ROBOTIC ASSISTED SIMPLE PROSTATECTOMY AND UMBILICAL HERNIA REPAIR flexible cystoscopy;  Surgeon: Alexis Frock, MD;  Location[facility];  Service: Urology;  Laterality: N/A;  Home Medications    Prior to Admission medications   Medication Sig Start Date End Date Taking? Authorizing Provider  carbidopa-levodopa (SINEMET) 25-100 MG tablet Take 1-1.5 tablets by mouth 4 (four) times daily. Take 3-4 hours apart such at 8am, 11-12am, 2-4pm and 6-8pm for example. Avoid high protein diet. 01/26/19 04/26/19  Melvenia Beam, MD  donepezil (ARICEPT) 10 MG tablet Take 1 tablet (10 mg total) by mouth daily. 09/08/19   Wilber Oliphant, MD  oxyCODONE-acetaminophen  (PERCOCET) 7.5-325 MG tablet Take 1 tablet by mouth 4 (four) times daily as needed. 09/10/19   [provider]    Family History Family History  Problem Relation Age of Onset  . Cancer - Other Other   . Diabetes Other   . Hypertension Other     Social History Social History   Tobacco Use  . Smoking status: Never Smoker  . Smokeless tobacco: Never Used  Vaping Use  . Vaping Use: Never used  Substance Use Topics  . Alcohol use: Yes    Alcohol/week: 0.0 standard drinks    Comment: 1 shot a day  . Drug use: No     Allergies   Poison ivy treatments   Review of Systems Review of Systems   Physical Exam Triage Vital Signs ED Triage Vitals [09/25/19 1627]  Enc Vitals Group     BP (!) 150/69     Pulse Rate 102     Resp 16     Temp 98.8 F (37.1 C)     Temp src      SpO2 100 %     Weight      Height      Head Circumference      Peak Flow      Pain Score      Pain Loc      Pain Edu?      Excl. in [place]?    No data found.  Updated Vital Signs BP (!) 150/69   Pulse 102   Temp 98.8 F (37.1 C)   Resp 16   SpO2 100%   Visual Acuity Right Eye Distance:   Left Eye Distance:   Bilateral Distance:    Right Eye Near:   Left Eye Near:    Bilateral Near:     Physical Exam Vitals and nursing note reviewed.  Constitutional:      Appearance: He is well-developed.     Comments: Seated on exam seated on exam table with slight tremor  HENT:     Head: Normocephalic and atraumatic.  Eyes:     Extraocular Movements: Extraocular movements intact.     Conjunctiva/sclera: Conjunctivae normal.  Cardiovascular:     Rate and Rhythm: Regular rhythm. Tachycardia present.     Heart sounds: No murmur heard.   Pulmonary:     Effort: Pulmonary effort is normal. No respiratory distress.     Breath sounds: Normal breath sounds. No wheezing, rhonchi or rales.  Skin:    General: Skin is warm and dry.  Neurological:     Mental Status: He is alert and oriented to  person, place, and time.     Comments: There is some right-sided lower facial droop.  Rest of facial droop exam limited.  Speech is fluent and clear.  Patient does squeeze but not firmly  General left side extremity weakness when compared to the right, Patient able to move upper extremities against gravity, however unable to move left leg off bed  Unable to stand  patient up.  Patient is nonambulatory without aid of walker or assistance      UC Treatments / Results  Labs (all labs ordered are listed, but only abnormal results are displayed) Labs Reviewed - No data to display  EKG   Radiology No results found.  Procedures Procedures (including critical care time)  Medications Ordered in UC Medications - No data to display  Initial Impression / Assessment and Plan / UC Course  I have reviewed the triage vital signs and the nursing notes.  Pertinent labs & imaging results that were available during my care of the patient were reviewed by me and considered in my medical decision making (see chart for details).     ##Unilateral weakness Patient is a 74 year old gentleman with numerous comorbidities presenting with unilateral weakness.  Concern for stroke.  Last known well 9 PM last night, roughly 19 to 20 hours ago.  Stable vital signs here.  Limited NIH evaluation, 2 points for facial droop, however he does have possible three-point addition for left lower leg.  Patient sent by EMS to emergency department.  [facility] emergency department notified of patient's status and impending arrival Final Clinical Impressions(s) / UC Diagnoses   Final diagnoses:  Unilateral weakness   Discharge Instructions   None    ED Prescriptions    None     PDMP not reviewed this encounter.   Purnell Shoemaker, PA-C 09/25/19 [phone]

## 2019-09-25 NOTE — ED Notes (Signed)
Patient verbalizes understanding of discharge instructions. Opportunity for questioning and answers were provided. Armband removed by staff, pt discharged from ED via wheelchair with caregiver.

## 2019-09-25 NOTE — ED Notes (Signed)
Report called to ER charge.

## 2019-09-25 NOTE — ED Triage Notes (Signed)
Per family, pt started having L sided weakness and "difficulty carrying on a conversation" since 9pm last night

## 2019-09-25 NOTE — ED Notes (Signed)
Patient transported to CT 

## 2019-09-26 LAB — URINE CULTURE: Culture: <10000 — AB

## 2019-09-27 NOTE — Telephone Encounter (Signed)
Order sent. Will forward to Pool.

## 2019-09-29 NOTE — Telephone Encounter (Signed)
Community message notifying of DME order has been sent to [employer], Alyse Low and Darlina Guys. Ottis Stain, CMA d

## 2019-10-06 DIAGNOSIS — R809 Proteinuria, unspecified: Secondary | ICD-10-CM | POA: Diagnosis not present

## 2019-10-06 DIAGNOSIS — N1832 Chronic kidney disease, stage 3b: Secondary | ICD-10-CM | POA: Diagnosis not present

## 2019-10-06 DIAGNOSIS — I129 Hypertensive chronic kidney disease with stage 1 through stage 4 chronic kidney disease, or unspecified chronic kidney disease: Secondary | ICD-10-CM | POA: Diagnosis not present

## 2019-10-06 DIAGNOSIS — D631 Anemia in chronic kidney disease: Secondary | ICD-10-CM | POA: Diagnosis not present

## 2019-10-06 DIAGNOSIS — N2581 Secondary hyperparathyroidism of renal origin: Secondary | ICD-10-CM | POA: Diagnosis not present

## 2019-10-07 DIAGNOSIS — G20 Parkinson's disease: Secondary | ICD-10-CM | POA: Diagnosis not present

## 2019-10-09 DIAGNOSIS — G8929 Other chronic pain: Secondary | ICD-10-CM | POA: Diagnosis not present

## 2019-10-09 DIAGNOSIS — M5442 Lumbago with sciatica, left side: Secondary | ICD-10-CM | POA: Diagnosis not present

## 2019-10-09 DIAGNOSIS — Z79899 Other long term (current) drug therapy: Secondary | ICD-10-CM | POA: Diagnosis not present

## 2019-10-09 DIAGNOSIS — M109 Gout, unspecified: Secondary | ICD-10-CM | POA: Diagnosis not present

## 2019-10-09 DIAGNOSIS — Z9181 History of falling: Secondary | ICD-10-CM | POA: Diagnosis not present

## 2019-10-09 DIAGNOSIS — M5441 Lumbago with sciatica, right side: Secondary | ICD-10-CM | POA: Diagnosis not present

## 2019-10-12 ENCOUNTER — Ambulatory Visit: Payer: Medicare Other | Attending: Family Medicine | Admitting: Physical Therapy

## 2019-10-23 ENCOUNTER — Ambulatory Visit: Payer: Medicare Other | Admitting: Family Medicine

## 2019-10-28 ENCOUNTER — Telehealth: Payer: Self-pay | Admitting: Family Medicine

## 2019-10-28 NOTE — Telephone Encounter (Signed)
It appears that patient was dismissed from [facility] in the past and [facility] declined it too, likely because of that.  There are no other [place] facilities so he had to be sent to another office.  Malcolm Quast,CMA

## 2019-10-28 NOTE — Telephone Encounter (Signed)
Tyler Lester calls regarding this patients neurology referral. She said they were told this referral was sent to [facility] and when they called them, [facility] told them they didn't have a referral for Tyler Lester. I looked into the referral and told them it was sent to [facility] back in June and gave patient that phone number to call.   Tyler Lester says she's 'not very happy with [name] beng bounced around and given wrong information because his health is important.'   Routing to PCP and referral coordinator.

## 2019-10-29 NOTE — Telephone Encounter (Signed)
I have told the patient and Felicia multiple times during different appointments and telephone encounters that since he has been dismissed from other neurology offices, I had to send referral to other neurology offices. I have also told them that they can personally reach out neurology offices to see if they take their insurance. It is not our responsibility to find a provider for him given his multiple dismissal from different neurology practices. Once they find a neurology practice that will accept him as a patient, I am happy to send a referral.

## 2019-11-03 ENCOUNTER — Telehealth: Payer: Self-pay | Admitting: Family Medicine

## 2019-11-03 DIAGNOSIS — G20 Parkinson's disease: Secondary | ICD-10-CM

## 2019-11-03 NOTE — Telephone Encounter (Signed)
Spoke to Tyler Lester over the phone and clarified referral request. Tyler Lester has spoke to Tyler Lester who said they would accent a referral request if sent. This was not sent in the past as there was previous documentation that a referral to them had been declined. Will send referral.   Additionally, encouraged Tyler Lester to call to other neurology facilities to see who is accepting new patients and let us know if she needs a formal referral. We will not find a neurologist for them given recent difficulties with dismissal, etc.   Wilber Oliphant, M.D.  8:35 PM 11/03/2019

## 2019-11-03 NOTE — Telephone Encounter (Signed)
Tyler Lester is calling to get referral to [facility] sent to [employer]. Please advise thanks

## 2019-11-04 NOTE — Telephone Encounter (Signed)
Sent referral to [facility].  They should contact patient and spouse for the appointment. Lason Eveland,CMA

## 2019-11-05 ENCOUNTER — Ambulatory Visit: Payer: Medicare Other | Attending: Family Medicine

## 2019-11-05 ENCOUNTER — Other Ambulatory Visit: Payer: Self-pay | Admitting: Family Medicine

## 2019-11-05 ENCOUNTER — Other Ambulatory Visit: Payer: Self-pay

## 2019-11-05 DIAGNOSIS — M25651 Stiffness of right hip, not elsewhere classified: Secondary | ICD-10-CM | POA: Diagnosis not present

## 2019-11-05 DIAGNOSIS — R252 Cramp and spasm: Secondary | ICD-10-CM | POA: Diagnosis not present

## 2019-11-05 DIAGNOSIS — R293 Abnormal posture: Secondary | ICD-10-CM

## 2019-11-05 DIAGNOSIS — R262 Difficulty in walking, not elsewhere classified: Secondary | ICD-10-CM | POA: Diagnosis not present

## 2019-11-05 DIAGNOSIS — M25551 Pain in right hip: Secondary | ICD-10-CM | POA: Diagnosis not present

## 2019-11-05 NOTE — Therapy (Addendum)
[place], [place], [address] Phone: [phone]   Fax:  [phone]  Physical Therapy Treatment  Patient Details  Name: Tyler Lester MRN: [id number] Date of Birth: 1945/05/27 Referring Provider (PT): Talbert Cage, MD   Encounter Date: 11/05/2019   PT End of Session - 11/05/19 1415    Visit Number 1    Number of Visits 12    Date for PT Re-Evaluation 12/18/19    Authorization Type UHC MCR / MCD    Authorization Time Period FOTO visit 6    Progress Note Due on Visit 10    PT Start Time 0207    PT Stop Time 0245    PT Time Calculation (min) 38 min    Activity Tolerance Patient tolerated treatment well    Behavior During Therapy [facility] for tasks assessed/performed           Past Medical History:  Diagnosis Date  . Bilateral foot pain   . Chronic pain   . Diabetes mellitus    type 2, diet controlled now  . Foley catheter in place since nov 2018, last changed 1 week ago  . Hyperlipidemia   . Hypertension   . Inguinal hernia    right  . Numbness    T/O  . Parkinson disease ([place]) 08/26/2015   [facility]. Last visit 06/03/2015. History of poor compliance with his medication. Referred him back to [facility] in 10/2016. They didn't want to see him back due to poor compliance with medical advise. Last plan from 06/03/2015 still valid  . Parkinson's disease ([place])   . Pneumonia yrs ago  . Shortness of breath    with exertion  . Urinary retention     Past Surgical History:  Procedure Laterality Date  . CATARACT EXTRACTION W/ INTRAOCULAR LENS IMPLANT Left 06/2012   laser for posterior capsule?  . COLON SURGERY    . POSTERIOR CERVICAL FUSION/FORAMINOTOMY N/A 11/12/2013   Procedure: Posterior cervical decompression fusion, cervical 3-4, cervical 4-5, cervical 5-6, cervical 6-7 with instrumentation and allograft   (LEVEL 4);  Surgeon: Sinclair Ship, MD;  Location: [facility];  Service: Orthopedics;   Laterality: N/A;  Posterior cervical decompression fusion, cervical 3-4, cervical 4-5, cervical 5-6, cervical 6-7 with instrumentation and allograft  . TONSILLECTOMY    . XI ROBOTIC ASSISTED SIMPLE PROSTATECTOMY N/A 03/07/2016   Procedure: XI ROBOTIC ASSISTED SIMPLE PROSTATECTOMY AND UMBILICAL HERNIA REPAIR flexible cystoscopy;  Surgeon: Alexis Frock, MD;  [facility];  Service: Urology;  Laterality: N/A;    There were no vitals filed for this visit.   Subjective Assessment - 11/05/19 1406    Subjective He reports  pain posterior RT thigh from hip    Limitations Walking    How long can you sit comfortably? As needed    How long can you walk comfortably? 1 block    Diagnostic tests Xray    Patient Stated Goals Less pain and to walk better    Currently in Pain? Yes    Pain Score 5     Pain Location Buttocks    Pain Orientation Right   Lt less pain   Pain Descriptors / Indicators Sharp    Pain Type Chronic pain    Pain Radiating Towards Rt posterior thigh to knee    Pain Onset More than a month ago    Pain Frequency Constant    Aggravating Factors  walking    Pain Relieving Factors sitting  [facility] PT Assessment - 11/05/19 0001      Assessment   Medical Diagnosis RT ischial pain    Referring Provider (PT) Maschall Chambliss, MD    Onset Date/Surgical Date --   1 year ago   Next MD Visit As needed    Prior Therapy No      Precautions   Precautions Fall      Restrictions   Weight Bearing Restrictions No      Balance Screen   Has the patient fallen in the past 6 months No      [place] residence    Living Arrangements Other (Comment)   case worker   Type of [place] to enter;Ramped entrance    [place] of Steps [place] One level    [place] - 2 wheels;[place]ane - single point      Prior Function   Level of Independence Needs assistance with ADLs;Needs  assistance with homemaking    Vocation Retired      Journalist, newspaper Impaired      Observation/Other Assessments   Focus on Therapeutic Outcomes (FOTO)  42% with expected progress 54%      Posture/Postural Control   Posture Comments LT shoulder lower , forward had  RT lareral trunk shift  mild       ROM / Strength   AROM / PROM / Strength AROM;Strength;PROM      AROM   AROM Assessment Site Lumbar    Lumbar Flexion ablr to touch proximal tibia    increased iscial pain   Lumbar Extension to neutral with some pain RT lower back    Lumbar - Right Side Bend 5 degres with RT back pain    Lumbar - Left Side Bend 10 degrees without incr pain      PROM   Overall PROM Comments Passive  Lumbar rotation RT and Lt decrease 50% but more pain with LT lower body rotation.  RT hip flexion 110 , Ext -5-10 degres , ER  40 degres IR 10 degrees adduction 5 degrees       Strength   Overall Strength Comments grossly WNL in both LE       Flexibility   Soft Tissue Assessment /Muscle Length yes    Hamstrings 45 degrees bilaterally    ITB + Obers's Rt .       Palpation   SI assessment  RT ASIS more distal , Stiffness with longitudinal pull to R pelvis     Palpation comment No significant tenderness on RT iscial tuberosity but tender piriformis and gluteals , mild tenderness lower lumbar soft tissues.                                  PT Education - 11/05/19 1415    Education Details POC,, Reviewd FOTO score and possible progress.    Person(s) Educated Patient    Methods Explanation    Comprehension Verbalized understanding            PT Short Term Goals - 11/05/19 1505      PT SHORT TERM GOAL #1   Title He will be able to demo HEP with minor set up cues    Time 3    Period Weeks    Status New      PT SHORT TERM  GOAL #2   Title He will report pain decreased 30% or more    Time 3    Period Weeks    Status New      PT SHORT TERM GOAL #3   Title FOTO score  improved 5 points    Time 3    Period Weeks    Status New             PT Long Term Goals - 11/05/19 1507      PT LONG TERM GOAL #1   Title He will be independent with all hEP with assist from care giver if needed    Time 6    Period Weeks    Status New      PT LONG TERM GOAL #2   Title He will met FOTO score predicted    Time 6    Period Weeks    Status New      PT LONG TERM GOAL #3   Title He will report pain as intermittant and no more than mild.    Time 6    Period Weeks    Status New      PT LONG TERM GOAL #4   Title He will report able to walk a block without increased pain    Time 6    Period Weeks    Status New                 Plan - 11/05/19 1416    Clinical Impression Statement MR Blumen[place] presents with complaint of RT buttock and posterior thigh pain. He was tender in RT gluteals  and mildly in lower back.   Hi was tight in all hip and back ROM. asymetric posture in back and pelvis.   He should improve with skilled PT and consistent HEp though he may be limited with this and hope his care giver can assist.    Personal Factors and Comorbidities Age;Past/Current Experience;Time since onset of injury/illness/exacerbation;Education;Comorbidity 1    Comorbidities parkinson's    Examination-Activity Limitations Locomotion Level;Transfers;Stand;Lift;Carry    Examination-Participation Restrictions Meal Prep;Community Activity   light and moderate home tasks   Stability/Clinical Decision Making Stable/Uncomplicated    Clinical Decision Making Low    Rehab Potential Fair    PT Frequency 2x / week    PT Duration 6 weeks          Interventions:     Iontophoresis 1cc dexamexasone,  Dry needling, Therapeutic exercise. Therapeutic activity, MHP , electrical stimulation,  Manual , PROM , taping.  Next session    , manual and modalities for pain and ROM of back and hip . HEP for stretching and core strength.  Patient will benefit from skilled therapeutic  intervention in order to improve the following deficits and impairments:  Pain, Increased muscle spasms, Difficulty walking, Decreased range of motion, Decreased activity tolerance, Postural dysfunction, Impaired flexibility  Visit Diagnosis: Right-sided ischial pain - Plan: PT plan of care cert/re-cert  Difficulty in walking, not elsewhere classified - Plan: PT plan of care cert/re-cert  Cramp and spasm - Plan: PT plan of care cert/re-cert  Right hip pain - Plan: PT plan of care cert/re-cert  Stiffness of right hip, not elsewhere classified - Plan: PT plan of care cert/re-cert  Abnormal posture - Plan: PT plan of care cert/re-cert     Problem List Patient Active Problem List   Diagnosis Date Noted  . Ischial pain, right 08/25/2019  . Palpitations 11/24/2018  . Onychomycosis 08/05/2017  .  Chronic bilateral low back pain with bilateral sciatica 01/30/2017  . Memory change 11/09/2016  . Hyperlipidemia 11/05/2016  . Diabetic polyneuropathy associated with type 2 diabetes mellitus ([place]) 04/13/2016  . 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 Diminished hearing, left 01/20/2016  . Type 2 diabetes mellitus with complication ([place]) 61/44/3154  . Gait abnormality 03/28/2015  . Postlaminectomy syndrome, cervical region 07/23/2014  . Parkinson's disease ([place]) 07/23/2014  . Back pain with sciatica 05/27/2014  . Bilateral leg pain 05/14/2014  . Depression 10/15/2013  . CKD (chronic kidney disease) stage 3, GFR 30-59 ml/min 08/25/2013  . Tremor 05/23/2013  . Chronic diastolic heart failure ([place]) 05/06/2013  . Onychogryposis of toenail 05/14/2012  . GERD (gastroesophageal reflux disease) 07/04/2011  . Essential hypertension 01/05/2011    Darrel Hoover  PT 11/05/2019, 3:41 PM  [facility] [address] [place], [place], [address] Phone: [phone]   Fax:  [phone]  Name: FABYAN LOUGHMILLER MRN: [id number] Date of  Birth: 02-15-1946

## 2019-11-09 DIAGNOSIS — M5442 Lumbago with sciatica, left side: Secondary | ICD-10-CM | POA: Diagnosis not present

## 2019-11-09 DIAGNOSIS — Z9181 History of falling: Secondary | ICD-10-CM | POA: Diagnosis not present

## 2019-11-09 DIAGNOSIS — M5441 Lumbago with sciatica, right side: Secondary | ICD-10-CM | POA: Diagnosis not present

## 2019-11-09 DIAGNOSIS — Z79899 Other long term (current) drug therapy: Secondary | ICD-10-CM | POA: Diagnosis not present

## 2019-11-09 DIAGNOSIS — M109 Gout, unspecified: Secondary | ICD-10-CM | POA: Diagnosis not present

## 2019-11-09 DIAGNOSIS — G8929 Other chronic pain: Secondary | ICD-10-CM | POA: Diagnosis not present

## 2019-11-12 ENCOUNTER — Encounter: Payer: Medicare Other | Admitting: Physical Therapy

## 2019-11-17 ENCOUNTER — Ambulatory Visit: Payer: Medicare Other | Admitting: Physical Therapy

## 2019-11-17 ENCOUNTER — Encounter: Payer: Self-pay | Admitting: Physical Therapy

## 2019-11-17 ENCOUNTER — Other Ambulatory Visit: Payer: Self-pay

## 2019-11-17 DIAGNOSIS — M25551 Pain in right hip: Secondary | ICD-10-CM

## 2019-11-17 DIAGNOSIS — R252 Cramp and spasm: Secondary | ICD-10-CM | POA: Diagnosis not present

## 2019-11-17 DIAGNOSIS — R262 Difficulty in walking, not elsewhere classified: Secondary | ICD-10-CM

## 2019-11-17 DIAGNOSIS — R293 Abnormal posture: Secondary | ICD-10-CM | POA: Diagnosis not present

## 2019-11-17 DIAGNOSIS — M25651 Stiffness of right hip, not elsewhere classified: Secondary | ICD-10-CM | POA: Diagnosis not present

## 2019-11-17 NOTE — Patient Instructions (Addendum)
Trigger Point Dry Needling  . What is Trigger Point Dry Needling (DN)? o DN is a physical therapy technique used to treat muscle pain and dysfunction. Specifically, DN helps deactivate muscle trigger points (muscle knots).  o A thin filiform needle is used to penetrate the skin and stimulate the underlying trigger point. The goal is for a local twitch response (LTR) to occur and for the trigger point to relax. No medication of any kind is injected during the procedure.   . What Does Trigger Point Dry Needling Feel Like?  o The procedure feels different for each individual patient. Some patients report that they do not actually feel the needle enter the skin and overall the process is not painful. Very mild bleeding may occur. However, many patients feel a deep cramping in the muscle in which the needle was inserted. This is the local twitch response.   Marland Kitchen How Will I feel after the treatment? o Soreness is normal, and the onset of soreness may not occur for a few hours. Typically this soreness does not last longer than two days.  o Bruising is uncommon, however; ice can be used to decrease any possible bruising.  o In rare cases feeling tired or nauseous after the treatment is normal. In addition, your symptoms may get worse before they get better, this period will typically not last longer than 24 hours.   . What Can I do After My Treatment? o Increase your hydration by drinking more water for the next 24 hours. o You may place ice or heat on the areas treated that have become sore, however, do not use heat on inflamed or bruised areas. Heat often brings more relief post needling. o You can continue your regular activities, but vigorous activity is not recommended initially after the treatment for 24 hours. o DN is best combined with other physical therapy such as strengthening, stretching, and other therapies.         Voncille Lo, PT, [place] Certified Exercise Expert for the Aging  Adult  11/17/19 4:29 PM Phone: [phone] Fax: [phone]

## 2019-11-17 NOTE — Therapy (Signed)
[place], [place], [address] Phone: [phone]   Fax:  [phone]  Physical Therapy Treatment  Patient Details  Name: Tyler Lester MRN: [id number] Date of Birth: 07-19-1945 Referring Provider (PT): Talbert Cage, MD   Encounter Date: 11/17/2019   PT End of Session - 11/17/19 1545    Visit Number 2    Number of Visits 12    Date for PT Re-Evaluation 12/18/19    Authorization Type UHC MCR / MCD    Authorization Time Period FOTO visit 6    Progress Note Due on Visit 10    PT Start Time 1545    Activity Tolerance Patient tolerated treatment well    Behavior During Therapy [facility] for tasks assessed/performed           Past Medical History:  Diagnosis Date  . Bilateral foot pain   . Chronic pain   . Diabetes mellitus    type 2, diet controlled now  . Foley catheter in place since nov 2018, last changed 1 week ago  . Hyperlipidemia   . Hypertension   . Inguinal hernia    right  . Numbness    T/O  . Parkinson disease ([place]) 08/26/2015   Followed by [facility]. Last visit 06/03/2015. History of poor compliance with his medication. Referred him back to [facility] in 10/2016. They didn't want to see him back due to poor compliance with medical advise. Last plan from 06/03/2015 still valid  . Parkinson's disease ([place])   . Pneumonia yrs ago  . Shortness of breath    with exertion  . Urinary retention     Past Surgical History:  Procedure Laterality Date  . CATARACT EXTRACTION W/ INTRAOCULAR LENS IMPLANT Left 06/2012   laser for posterior capsule?  . COLON SURGERY    . POSTERIOR CERVICAL FUSION/FORAMINOTOMY N/A 11/12/2013   Procedure: Posterior cervical decompression fusion, cervical 3-4, cervical 4-5, cervical 5-6, cervical 6-7 with instrumentation and allograft   (LEVEL 4);  Surgeon: Sinclair Ship, MD;  Location: [facility];  Service: Orthopedics;  Laterality: N/A;  Posterior cervical decompression fusion,  cervical 3-4, cervical 4-5, cervical 5-6, cervical 6-7 with instrumentation and allograft  . TONSILLECTOMY    . XI ROBOTIC ASSISTED SIMPLE PROSTATECTOMY N/A 03/07/2016   Procedure: XI ROBOTIC ASSISTED SIMPLE PROSTATECTOMY AND UMBILICAL HERNIA REPAIR flexible cystoscopy;  Surgeon: Alexis Frock, MD;  [facility];  Service: Urology;  Laterality: N/A;    There were no vitals filed for this visit.   Subjective Assessment - 11/17/19 1552    Subjective I want to be able to walk again.  It hurts when I walk and when I sit down on my Rt side    Limitations Walking    Diagnostic tests Xray    Patient Stated Goals Less pain and to walk better    Currently in Pain? Yes    Pain Score 5     Pain Orientation Right    Pain Descriptors / Indicators Sharp    Pain Type Chronic pain    Pain Radiating Towards Rt post thigh to knee    Pain Onset More than a month ago    Pain Frequency Constant    Aggravating Factors  walking and sitting                             [facility] Adult PT Treatment/Exercise - 11/17/19 0001  Knee/Hip Exercises: Stretches   Piriformis Stretch 3 reps;Right;30 seconds    Piriformis Stretch Limitations use of towel to assist     Other Knee/Hip Stretches figure 4 supine piriformis stretch 3 x 30 in hooklying Rt ankle on LT knee      Knee/Hip Exercises: Standing   Functional Squat 10 reps;2 sets    Functional Squat Limitations sink squat with chair encouraging fast ascension for power    Other Standing Knee Exercises standing march at [employer].  deep squat at ll bars      Modalities   Modalities Moist Heat      Moist Heat Therapy   Number Minutes Moist Heat 12 Minutes    Moist Heat Location Hip   Rt ischial tuberosity     Manual Therapy   Manual Therapy Passive ROM;Soft tissue mobilization    Manual therapy comments skilled palpation for TPDN    Soft tissue mobilization RT gluteal    Passive ROM Rt piriformis over stretching to end range   figure 4  stretch , knee to chest.  Pt having difficulty doing full range himself            Trigger Point Dry Needling - 11/17/19 0001    Consent Given? Yes    Education Handout Provided Yes    Muscles Treated Lower Quadrant --    Muscles Treated Back/Hip Gluteus maximus;Piriformis   rt side only   Other Dry Needling 100 mm gage 40 mm    Hamstring Response --    Gluteus Maximus Response Palpable increased muscle length    Piriformis Response Twitch response elicited;Palpable increased muscle length                PT Education - 11/17/19 1630    Education Details added to HEP piriformis with towel hold and figure 4 stretch, and sit to stand with sink hold    Person(s) Educated Patient    Methods Explanation;Demonstration;Tactile cues;Verbal cues;Handout    Comprehension Verbalized understanding;Returned demonstration            PT Short Term Goals - 11/05/19 1505      PT SHORT TERM GOAL #1   Title He will be able to demo HEP with minor set up cues    Time 3    Period Weeks    Status New      PT SHORT TERM GOAL #2   Title He will report pain decreased 30% or more    Time 3    Period Weeks    Status New      PT SHORT TERM GOAL #3   Title FOTO score improved 5 points    Time 3    Period Weeks    Status New             PT Long Term Goals - 11/05/19 1507      PT LONG TERM GOAL #1   Title He will be independent with all hEP with assist from care giver if needed    Time 6    Period Weeks    Status New      PT LONG TERM GOAL #2   Title He will met FOTO score predicted    Time 6    Period Weeks    Status New      PT LONG TERM GOAL #3   Title He will report pain as intermittant and no more than mild.    Time 6    Period Weeks  Status New      PT LONG TERM GOAL #4   Title He will report able to walk a block without increased pain    Time 6    Period Weeks    Status New            Access Code: [email or web address]: [email or web address]:  09/21/2021Prepared by: Donnetta Simpers BeardsleyExercises  Supine Figure 4 Piriformis Stretch - 1 x daily - 7 x weekly - 1 sets - 3 reps - 30 hold  Supine Piriformis Stretch with Towel - 1 x daily - 7 x weekly - 1 sets - 3 reps - 30 hold  Sit to Stand with Counter Support - 1 x daily - 7 x weekly - 3 sets - 10 reps       Plan - 11/17/19 1545    Clinical Impression Statement Mr Tunney presents with RT buttock pain and consents to [facility] and has 5/10 pain reduced to 3/10 at end of session.  Pt has trouble completing Full AROM of piriformis stretch.  Pt legs with edemal and open weeping wounds.  Pt encouraged to wear compression socks but unable to fit them over enlarged swollen LE's  Pt told to see MD and care for his LE.  Pt was able to walk with decrease antalgic gait after RX.  Pt closely monitored during TPDN.  Ms Costlow would like to be able to walk better and utilize cane less.  Pt does present with shuffling gait. Pt needs extra time to complete any task  Will contn POC    Personal Factors and Comorbidities Age;Past/Current Experience;Time since onset of injury/illness/exacerbation;Education;Comorbidity 1    Comorbidities parkinson's    Examination-Activity Limitations Locomotion Level;Transfers;Stand;Lift;Carry    Examination-Participation Restrictions Meal Prep;Community Activity    Stability/Clinical Decision Making Stable/Uncomplicated    Clinical Decision Making Low    Rehab Potential Fair    PT Frequency 2x / week    PT Duration 6 weeks    PT Treatment/Interventions Joint Manipulations;Taping;Dry needling;Passive range of motion;Manual techniques;Patient/family education;Neuromuscular re-education;Therapeutic exercise;Stair training;Gait training;Functional mobility training;Therapeutic activities;Moist Heat;Ultrasound;Iontophoresis 4mg /ml Dexamethasone;Electrical Stimulation;Cryotherapy;ADLs/Self Care Home Management    PT Next Visit Plan assess TPDN, reveiw HEP add marching at [place]           Patient will benefit from skilled therapeutic intervention in order to improve the following deficits and impairments:  Pain, Increased muscle spasms, Difficulty walking, Decreased range of motion, Decreased activity tolerance, Postural dysfunction, Impaired flexibility  Visit Diagnosis: Right-sided ischial pain  Difficulty in walking, not elsewhere classified  Cramp and spasm  Right hip pain  Stiffness of right hip, not elsewhere classified  Abnormal posture     Problem List Patient Active Problem List   Diagnosis Date Noted  . Ischial pain, right 08/25/2019  . Palpitations 11/24/2018  . Onychomycosis 08/05/2017  . Chronic bilateral low back pain with bilateral sciatica 01/30/2017  . Memory change 11/09/2016  . Hyperlipidemia 11/05/2016  . Diabetic polyneuropathy associated with type 2 diabetes mellitus ([place]) 04/13/2016  . 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 Diminished hearing, left 01/20/2016  . Type 2 diabetes mellitus with complication ([place]) 67/59/1638  . Gait abnormality 03/28/2015  . Postlaminectomy syndrome, cervical region 07/23/2014  . Parkinson's disease ([place]) 07/23/2014  . Back pain with sciatica 05/27/2014  . Bilateral leg pain 05/14/2014  . Depression 10/15/2013  . CKD (chronic kidney disease) stage 3, GFR 30-59 ml/min 08/25/2013  . Tremor 05/23/2013  . Chronic diastolic  heart failure ([place]) 05/06/2013  . Onychogryposis of toenail 05/14/2012  . GERD (gastroesophageal reflux disease) 07/04/2011  . Essential hypertension 01/05/2011    Voncille Lo, PT, [place] Certified Exercise Expert for the Aging Adult  11/17/19 4:58 PM Phone: [phone] Fax: [phone]  [facility] [address] [place], [place], [address] Phone: [phone]   Fax:  [phone]  Name: JAVION HOLMER MRN: [id number] Date of Birth: [birth year]

## 2019-11-20 ENCOUNTER — Ambulatory Visit: Payer: Medicare Other | Admitting: Physical Therapy

## 2019-11-20 ENCOUNTER — Other Ambulatory Visit: Payer: Self-pay | Admitting: Family Medicine

## 2019-11-20 DIAGNOSIS — G20 Parkinson's disease: Secondary | ICD-10-CM

## 2019-11-20 DIAGNOSIS — I890 Lymphedema, not elsewhere classified: Secondary | ICD-10-CM | POA: Diagnosis not present

## 2019-11-20 DIAGNOSIS — I83893 Varicose veins of bilateral lower extremities with other complications: Secondary | ICD-10-CM | POA: Diagnosis not present

## 2019-11-20 DIAGNOSIS — L97211 Non-pressure chronic ulcer of right calf limited to breakdown of skin: Secondary | ICD-10-CM | POA: Diagnosis not present

## 2019-11-20 DIAGNOSIS — L97221 Non-pressure chronic ulcer of left calf limited to breakdown of skin: Secondary | ICD-10-CM | POA: Diagnosis not present

## 2019-11-20 NOTE — Telephone Encounter (Signed)
Tyler Lester is needing a refill on carbidopa-levodopa (SINEMET) 25-100 MG tablet (Parkinson's Meds)  He couldn't understand what i was saying when i said imma send you to nurse line leave a message so i am just doing this for him. He also seems very confused just called back and asked brooke for refill almost like he never spoke to me.

## 2019-11-21 MED ORDER — CARBIDOPA-LEVODOPA 25-100 MG PO TABS: 1.0000 | ORAL_TABLET | Freq: Four times a day (QID) | ORAL | 1 refills | Status: DC

## 2019-11-23 ENCOUNTER — Ambulatory Visit: Payer: Medicare Other | Admitting: Physical Therapy

## 2019-11-23 ENCOUNTER — Other Ambulatory Visit: Payer: Self-pay

## 2019-11-23 DIAGNOSIS — R252 Cramp and spasm: Secondary | ICD-10-CM | POA: Diagnosis not present

## 2019-11-23 DIAGNOSIS — M25651 Stiffness of right hip, not elsewhere classified: Secondary | ICD-10-CM | POA: Diagnosis not present

## 2019-11-23 DIAGNOSIS — R293 Abnormal posture: Secondary | ICD-10-CM | POA: Diagnosis not present

## 2019-11-23 DIAGNOSIS — R262 Difficulty in walking, not elsewhere classified: Secondary | ICD-10-CM | POA: Diagnosis not present

## 2019-11-23 DIAGNOSIS — M25551 Pain in right hip: Secondary | ICD-10-CM

## 2019-11-23 NOTE — Therapy (Signed)
[place], [place], [address] Phone: [phone]   Fax:  [phone]  Physical Therapy Treatment  Patient Details  Name: Tyler Lester MRN: [id number] Date of Birth: Apr 17, 1945 Referring Provider (PT): Talbert Cage, MD   Encounter Date: 11/23/2019   PT End of Session - 11/23/19 1544    Visit Number 3    Number of Visits 12    Date for PT Re-Evaluation 12/18/19    Authorization Type UHC MCR / MCD    Authorization Time Period FOTO visit 6    Progress Note Due on Visit 10    PT Start Time 1545    PT Stop Time 1625    PT Time Calculation (min) 40 min    Activity Tolerance Patient tolerated treatment well    Behavior During Therapy [facility] for tasks assessed/performed           Past Medical History:  Diagnosis Date  . Bilateral foot pain   . Chronic pain   . Diabetes mellitus    type 2, diet controlled now  . Foley catheter in place since nov 2018, last changed 1 week ago  . Hyperlipidemia   . Hypertension   . Inguinal hernia    right  . Numbness    T/O  . Parkinson disease ([place]) 08/26/2015   Follow[facility] by [facility]. Last visit 06/03/2015. History of poor compliance with his medication. Referred him back to [facility] in 10/2016. They didn't want to see him back due to poor compliance with medical advise. Last plan from 06/03/2015 still valid  . Parkinson's disease ([place])   . Pneumonia yrs ago  . Shortness of breath    with exertion  . Urinary retention     Past Surgical History:  Procedure Laterality Date  . CATARACT EXTRACTION W/ INTRAOCULAR LENS IMPLANT Left 06/2012   laser for posterior capsule?  . COLON SURGERY    . POSTERIOR CERVICAL FUSION/FORAMINOTOMY N/A 11/12/2013   Procedure: Posterior cervical decompression fusion, cervical 3-4, cervical 4-5, cervical 5-6, cervical 6-7 with instrumentation and allograft   (LEVEL 4);  Surgeon: Sinclair Ship, MD;  Location: [facility];  Service: Orthopedics;   Laterality: N/A;  Posterior cervical decompression fusion, cervical 3-4, cervical 4-5, cervical 5-6, cervical 6-7 with instrumentation and allograft  . TONSILLECTOMY    . XI ROBOTIC ASSISTED SIMPLE PROSTATECTOMY N/A 03/07/2016   Procedure: XI ROBOTIC ASSISTED SIMPLE PROSTATECTOMY AND UMBILICAL HERNIA REPAIR flexible cystoscopy;  Surgeon: Alexis Frock, MD;  [facility];  Service: Urology;  Laterality: N/A;    There were no vitals filed for this visit.   Subjective Assessment - 11/23/19 1546    Subjective "I am still having issues with R leg. The DN helped last session and it helped and it lasted for a little while. my leg has more swelling and I am currently wrapped.    Patient Stated Goals Less pain and to walk better    Currently in Pain? Yes    Pain Score 5     Pain Orientation Right    Pain Type Chronic pain    Pain Onset More than a month ago    Pain Frequency Constant    Aggravating Factors  walking    Pain Relieving Factors sitting              [facility] PT Assessment - 11/23/19 0001      Assessment   Medical Diagnosis RT ischial pain    Referring Provider (PT) Ruthann Cancer  Erin Hearing, MD                         [facility] Adult PT Treatment/Exercise - 11/23/19 0001      Lumbar Exercises: Seated   Sit to Stand 10 reps   x 3 sets   Sit to Stand Limitations started with elevated table lowering table ea set. last rep from chair   cues to scoot to edge and utilze nose over toes   Other Seated Lumbar Exercises seated rocking forward with reacing to promote sit to stand transiition      Knee/Hip Exercises: Stretches   Active Hamstring Stretch 2 reps;30 seconds;Right   seated reaching with RUE   Piriformis Stretch 3 reps;Right;30 seconds    Piriformis Stretch Limitations use of towel to assist       Knee/Hip Exercises: Standing   Hip Flexion Stengthening;Both;2 sets;20 reps;Knee straight   with bil hip                  PT Education - 11/23/19 1547     Education Details reviewed missed appointment last session and discussed if he cannot make it to call and we can cancel that appointment    Person(s) Educated Patient    Methods Explanation    Comprehension Verbalized understanding            PT Short Term Goals - 11/05/19 1505      PT SHORT TERM GOAL #1   Title He will be able to demo HEP with minor set up cues    Time 3    Period Weeks    Status New      PT SHORT TERM GOAL #2   Title He will report pain decreased 30% or more    Time 3    Period Weeks    Status New      PT SHORT TERM GOAL #3   Title FOTO score improved 5 points    Time 3    Period Weeks    Status New             PT Long Term Goals - 11/05/19 1507      PT LONG TERM GOAL #1   Title He will be independent with all hEP with assist from care giver if needed    Time 6    Period Weeks    Status New      PT LONG TERM GOAL #2   Title He will met FOTO score predicted    Time 6    Period Weeks    Status New      PT LONG TERM GOAL #3   Title He will report pain as intermittant and no more than mild.    Time 6    Period Weeks    Status New      PT LONG TERM GOAL #4   Title He will report able to walk a block without increased pain    Time 6    Period Weeks    Status New                 Plan - 11/23/19 1624    Clinical Impression Statement pt reported improvement last session with TPDN but opted to hold off today stating he felt he didn't need it. continued hamstring/ piriformis stretching. practiced sit to stand biomechanics utilizing higher surface and progressing to lower surface. He required cues for trunk rocking and improved sit to  stand transition with exagerrated rocking which he was able to perofrm sit to stand from chair 10 with no assistance.    PT Treatment/Interventions Joint Manipulations;Taping;Dry needling;Passive range of motion;Manual techniques;Patient/family education;Neuromuscular re-education;Therapeutic exercise;Stair  training;Gait training;Functional mobility training;Therapeutic activities;Moist Heat;Ultrasound;Iontophoresis 73m/ml Dexamethasone;Electrical Stimulation;Cryotherapy;ADLs/Self Care Home Management    PT Next Visit Plan assess TPDN, reveiw HEP add marching at counter, gross hip strengthening, balance/ reaction training.    Consulted and Agree with Plan of Care Patient           Patient will benefit from skilled therapeutic intervention in order to improve the following deficits and impairments:  Pain, Increased muscle spasms, Difficulty walking, Decreased range of motion, Decreased activity tolerance, Postural dysfunction, Impaired flexibility  Visit Diagnosis: Right-sided ischial pain  Difficulty in walking, not elsewhere classified  Cramp and spasm  Right hip pain     Problem List Patient Active Problem List   Diagnosis Date Noted  . Ischial pain, right 08/25/2019  . Palpitations 11/24/2018  . Onychomycosis 08/05/2017  . Chronic bilateral low back pain with bilateral sciatica 01/30/2017  . Memory change 11/09/2016  . Hyperlipidemia 11/05/2016  . Diabetic polyneuropathy associated with type 2 diabetes mellitus ([place] 04/13/2016  . 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 Diminished hearing, left 01/20/2016  . Type 2 diabetes mellitus with complication ([place] 058/52/7782 . Gait abnormality 03/28/2015  . Postlaminectomy syndrome, cervical region 07/23/2014  . Parkinson's disease ([place] 07/23/2014  . Back pain with sciatica 05/27/2014  . Bilateral leg pain 05/14/2014  . Depression 10/15/2013  . CKD (chronic kidney disease) stage 3, GFR 30-59 ml/min 08/25/2013  . Tremor 05/23/2013  . Chronic diastolic heart failure ([place] 05/06/2013  . Onychogryposis of toenail 05/14/2012  . GERD (gastroesophageal reflux disease) 07/04/2011  . Essential hypertension 01/05/2011   KStarr LakePT, DPT, LAT, ATC  11/23/19  4:29 PM      [facility] [address]Phone: [phone]  Fax:  [phone] Name: Tyler FRIEDENMRN: [id number] of Birth: 606-02-1945

## 2019-11-26 ENCOUNTER — Ambulatory Visit: Payer: Medicare Other | Admitting: Physical Therapy

## 2019-11-26 DIAGNOSIS — L97211 Non-pressure chronic ulcer of right calf limited to breakdown of skin: Secondary | ICD-10-CM | POA: Diagnosis not present

## 2019-11-26 DIAGNOSIS — L97221 Non-pressure chronic ulcer of left calf limited to breakdown of skin: Secondary | ICD-10-CM | POA: Diagnosis not present

## 2019-11-27 DIAGNOSIS — G8929 Other chronic pain: Secondary | ICD-10-CM | POA: Diagnosis not present

## 2019-11-27 DIAGNOSIS — Z9181 History of falling: Secondary | ICD-10-CM | POA: Diagnosis not present

## 2019-11-27 DIAGNOSIS — G20 Parkinson's disease: Secondary | ICD-10-CM | POA: Diagnosis not present

## 2019-11-30 ENCOUNTER — Ambulatory Visit: Payer: Medicare Other | Admitting: Physical Therapy

## 2019-12-03 ENCOUNTER — Ambulatory Visit: Payer: Medicare Other | Admitting: Physical Therapy

## 2019-12-03 DIAGNOSIS — Z79899 Other long term (current) drug therapy: Secondary | ICD-10-CM | POA: Diagnosis not present

## 2019-12-03 DIAGNOSIS — M5441 Lumbago with sciatica, right side: Secondary | ICD-10-CM | POA: Diagnosis not present

## 2019-12-03 DIAGNOSIS — M5442 Lumbago with sciatica, left side: Secondary | ICD-10-CM | POA: Diagnosis not present

## 2019-12-03 DIAGNOSIS — M109 Gout, unspecified: Secondary | ICD-10-CM | POA: Diagnosis not present

## 2019-12-03 DIAGNOSIS — Z23 Encounter for immunization: Secondary | ICD-10-CM | POA: Diagnosis not present

## 2019-12-03 DIAGNOSIS — Z9181 History of falling: Secondary | ICD-10-CM | POA: Diagnosis not present

## 2019-12-03 DIAGNOSIS — G8929 Other chronic pain: Secondary | ICD-10-CM | POA: Diagnosis not present

## 2019-12-13 ENCOUNTER — Other Ambulatory Visit: Payer: Self-pay | Admitting: Family Medicine

## 2019-12-15 NOTE — Telephone Encounter (Signed)
Filling medication for patient only until he is able to find a neurology provider.

## 2019-12-16 ENCOUNTER — Ambulatory Visit: Payer: Medicare Other | Attending: Family Medicine | Admitting: Physical Therapy

## 2019-12-16 ENCOUNTER — Other Ambulatory Visit: Payer: Self-pay

## 2019-12-16 ENCOUNTER — Encounter: Payer: Self-pay | Admitting: Physical Therapy

## 2019-12-16 DIAGNOSIS — R262 Difficulty in walking, not elsewhere classified: Secondary | ICD-10-CM | POA: Diagnosis not present

## 2019-12-16 DIAGNOSIS — M25651 Stiffness of right hip, not elsewhere classified: Secondary | ICD-10-CM

## 2019-12-16 DIAGNOSIS — M25551 Pain in right hip: Secondary | ICD-10-CM

## 2019-12-16 NOTE — Therapy (Signed)
[place], [place], [address] Phone: [phone]   Fax:  [phone]  Physical Therapy Treatment / Re-certification  Patient Details  Name: Tyler Lester MRN: [id number] Date of Birth: 1945-09-25 Referring Provider (PT): Talbert Cage, MD   Encounter Date: 12/16/2019   PT End of Session - 12/16/19 1335    Visit Number 4    Number of Visits 12    Date for PT Re-Evaluation 01/13/20    Authorization Type UHC MCR / MCD    Authorization Time Period FOTO visit 6    Progress Note Due on Visit 10    PT Start Time 1332    PT Stop Time 1410    PT Time Calculation (min) 38 min    Activity Tolerance Patient tolerated treatment well    Behavior During Therapy [facility] for tasks assessed/performed           Past Medical History:  Diagnosis Date  . Bilateral foot pain   . Chronic pain   . Diabetes mellitus    type 2, diet controlled now  . Foley catheter in place since nov 2018, last changed 1 week ago  . Hyperlipidemia   . Hypertension   . Inguinal hernia    right  . Numbness    T/O  . Parkinson disease ([place]) 08/26/2015   Followed by [facility]. Last visit 06/03/2015. History of poor compliance with his medication. Referred him back to [place] in 10/2016. They didn't want to see him back due to poor compliance with medical advise. Last plan from 06/03/2015 still valid  . Parkinson's disease ([place])   . Pneumonia yrs ago  . Shortness of breath    with exertion  . Urinary retention     Past Surgical History:  Procedure Laterality Date  . CATARACT EXTRACTION W/ INTRAOCULAR LENS IMPLANT Left 06/2012   laser for posterior capsule?  . COLON SURGERY    . POSTERIOR CERVICAL FUSION/FORAMINOTOMY N/A 11/12/2013   Procedure: Posterior cervical decompression fusion, cervical 3-4, cervical 4-5, cervical 5-6, cervical 6-7 with instrumentation and allograft   (LEVEL 4);  Surgeon: Sinclair Ship, MD;  Location: [facility];  Service:  Orthopedics;  Laterality: N/A;  Posterior cervical decompression fusion, cervical 3-4, cervical 4-5, cervical 5-6, cervical 6-7 with instrumentation and allograft  . TONSILLECTOMY    . XI ROBOTIC ASSISTED SIMPLE PROSTATECTOMY N/A 03/07/2016   Procedure: XI ROBOTIC ASSISTED SIMPLE PROSTATECTOMY AND UMBILICAL HERNIA REPAIR flexible cystoscopy;  Surgeon: Alexis Frock, MD;  Location[facility];  Service: Urology;  Laterality: N/A;    There were no vitals filed for this visit.   Subjective Assessment - 12/16/19 1336    Subjective "I've been doing alright, I've been trying to do the exercises at home."    Currently in Pain? Yes    Pain Score 5    took pain meds at 10 am   Pain Orientation Right    Pain Descriptors / Indicators Tightness    Pain Type Chronic pain    Pain Onset More than a month ago    Pain Frequency Intermittent    Aggravating Factors  prolonged sitting    Pain Relieving Factors getting up and moving around.                             [facility] Adult PT Treatment/Exercise - 12/16/19 0001      Knee/Hip Exercises: Stretches   Active Hamstring Stretch 2  reps;30 seconds;Right;Both    Piriformis Stretch 2 reps;30 seconds;Both      Knee/Hip Exercises: Standing   Hip Flexion Stengthening;Both;2 sets;20 reps   alternating L/R with SPC in the R      Knee/Hip Exercises: Seated   Long Arc Quad 2 sets      Knee/Hip Exercises: Supine   Bridges Strengthening;Both;1 set;10 reps    Straight Leg Raises Both;1 set;10 reps;Strengthening      Manual Therapy   Manual Therapy Other (comment)    Manual therapy comments MTPR along L/R     Other Manual Therapy tack and stretch of the hamstring x 2    with tennis ball under hamstring                 PT Education - 12/16/19 1410    Education Details Reviewed HEP and dicussed POC moving forward    Person(s) Educated Patient    Methods Explanation;Verbal cues    Comprehension Verbalized understanding;Verbal cues  required            PT Short Term Goals - 12/16/19 1340      PT SHORT TERM GOAL #1   Title He will be able to demo HEP with minor set up cues    Baseline pt reports doing his HEP and a combination of other exercise he added to it    Status Achieved      PT SHORT TERM GOAL #2   Title He will report pain decreased 30% or more    Period Weeks    Status On-going      PT SHORT TERM GOAL #3   Title FOTO score improved 5 points    Period Weeks    Status Unable to assess             PT Long Term Goals - 12/16/19 1347      PT LONG TERM GOAL #1   Title He will be independent with all hEP with assist from care giver if needed    Period Weeks    Status On-going      PT LONG TERM GOAL #2   Title He will met FOTO score predicted    Period Weeks    Status Unable to assess      PT LONG TERM GOAL #3   Title He will report pain as intermittant and no more than mild.    Period Weeks    Status On-going      PT LONG TERM GOAL #4   Title He will report able to walk a block without increased pain    Period Weeks    Status On-going                 Plan - 12/16/19 1350    Clinical Impression Statement pt returns to PT after being seen for his last session on 11/23/2019. He reports consistency with his HEP and reports continued pain at 5/10. He has met his STG #1 and is working toward his remaining goals. Worked on STW along the [employer] with stretch, and gross hip strengthening which he noted relief of pain and tension with. He would benefit from continued physical therapy to decrease R posterior hip pain, improve endurance and maximize his function by addressing the deficits listed.    PT Frequency 1x / week    PT Duration 4 weeks    PT Treatment/Interventions Joint Manipulations;Taping;Dry needling;Passive range of motion;Manual techniques;Patient/family education;Neuromuscular re-education;Therapeutic exercise;Stair training;Gait training;Functional mobility  training;Therapeutic activities;Moist  Heat;Ultrasound;Iontophoresis 40m/ml Dexamethasone;Electrical Stimulation;Cryotherapy;ADLs/Self Care Home Management    PT Next Visit Plan assess TPDN, reveiw HEP add marching at counter, gross hip strengthening, balance/ reaction training.    PT Home Exercise Plan [id number]          Patient will benefit from skilled therapeutic intervention in order to improve the following deficits and impairments:  Pain, Increased muscle spasms, Difficulty walking, Decreased range of motion, Decreased activity tolerance, Postural dysfunction, Impaired flexibility  Visit Diagnosis: Right-sided ischial pain  Difficulty in walking, not elsewhere classified  Right hip pain  Stiffness of right hip, not elsewhere classified     Problem List Patient Active Problem List   Diagnosis Date Noted  . Ischial pain, right 08/25/2019  . Palpitations 11/24/2018  . Onychomycosis 08/05/2017  . Chronic bilateral low back pain with bilateral sciatica 01/30/2017  . Memory change 11/09/2016  . Hyperlipidemia 11/05/2016  . Diabetic polyneuropathy associated with type 2 diabetes mellitus ([facility] 04/13/2016  . 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 Diminished hearing, left 01/20/2016  . Type 2 diabetes mellitus with complication ([facility] 067/20/9470 . Gait abnormality 03/28/2015  . Postlaminectomy syndrome, cervical region 07/23/2014  . Parkinson's disease ([place] 07/23/2014  . Back pain with sciatica 05/27/2014  . Bilateral leg pain 05/14/2014  . Depression 10/15/2013  . CKD (chronic kidney disease) stage 3, GFR 30-59 ml/min (HCC) 08/25/2013  . Tremor 05/23/2013  . Chronic diastolic heart failure ([facility] 05/06/2013  . Onychogryposis of toenail 05/14/2012  . GERD (gastroesophageal reflux disease) 07/04/2011  . Essential hypertension 01/05/2011    KStarr LakePT, DPT, LAT, ATC  12/16/19  2:12 PM      [facility] [address]Phone: [phone]  Fax:  [phone] Name: Tyler CUMBIEMRN: [id number] of Birth: 610-26-1947

## 2019-12-18 DIAGNOSIS — R0602 Shortness of breath: Secondary | ICD-10-CM | POA: Diagnosis not present

## 2019-12-18 DIAGNOSIS — Z0000 Encounter for general adult medical examination without abnormal findings: Secondary | ICD-10-CM | POA: Diagnosis not present

## 2019-12-18 DIAGNOSIS — G20 Parkinson's disease: Secondary | ICD-10-CM | POA: Diagnosis not present

## 2019-12-18 DIAGNOSIS — R350 Frequency of micturition: Secondary | ICD-10-CM | POA: Diagnosis not present

## 2019-12-18 DIAGNOSIS — Z79899 Other long term (current) drug therapy: Secondary | ICD-10-CM | POA: Diagnosis not present

## 2019-12-18 DIAGNOSIS — Z7289 Other problems related to lifestyle: Secondary | ICD-10-CM | POA: Diagnosis not present

## 2019-12-18 DIAGNOSIS — E559 Vitamin D deficiency, unspecified: Secondary | ICD-10-CM | POA: Diagnosis not present

## 2019-12-18 DIAGNOSIS — R5383 Other fatigue: Secondary | ICD-10-CM | POA: Diagnosis not present

## 2019-12-18 DIAGNOSIS — E1140 Type 2 diabetes mellitus with diabetic neuropathy, unspecified: Secondary | ICD-10-CM | POA: Diagnosis not present

## 2019-12-18 DIAGNOSIS — Z20822 Contact with and (suspected) exposure to covid-19: Secondary | ICD-10-CM | POA: Diagnosis not present

## 2019-12-18 DIAGNOSIS — Z9181 History of falling: Secondary | ICD-10-CM | POA: Diagnosis not present

## 2020-01-01 ENCOUNTER — Ambulatory Visit: Payer: Medicare Other | Attending: Family Medicine | Admitting: Physical Therapy

## 2020-01-06 DIAGNOSIS — G8929 Other chronic pain: Secondary | ICD-10-CM | POA: Diagnosis not present

## 2020-01-06 DIAGNOSIS — M5441 Lumbago with sciatica, right side: Secondary | ICD-10-CM | POA: Diagnosis not present

## 2020-01-06 DIAGNOSIS — M109 Gout, unspecified: Secondary | ICD-10-CM | POA: Diagnosis not present

## 2020-01-06 DIAGNOSIS — Z79899 Other long term (current) drug therapy: Secondary | ICD-10-CM | POA: Diagnosis not present

## 2020-01-06 DIAGNOSIS — Z9181 History of falling: Secondary | ICD-10-CM | POA: Diagnosis not present

## 2020-01-06 DIAGNOSIS — M5442 Lumbago with sciatica, left side: Secondary | ICD-10-CM | POA: Diagnosis not present

## 2020-01-06 DIAGNOSIS — L03115 Cellulitis of right lower limb: Secondary | ICD-10-CM | POA: Diagnosis not present

## 2020-01-08 ENCOUNTER — Ambulatory Visit: Payer: Medicare Other | Admitting: Physical Therapy

## 2020-01-13 ENCOUNTER — Ambulatory Visit: Payer: Medicare Other | Admitting: Physical Therapy

## 2020-01-18 DIAGNOSIS — I872 Venous insufficiency (chronic) (peripheral): Secondary | ICD-10-CM | POA: Diagnosis not present

## 2020-01-18 DIAGNOSIS — G20 Parkinson's disease: Secondary | ICD-10-CM | POA: Diagnosis not present

## 2020-01-18 DIAGNOSIS — Z9181 History of falling: Secondary | ICD-10-CM | POA: Diagnosis not present

## 2020-01-18 DIAGNOSIS — I10 Essential (primary) hypertension: Secondary | ICD-10-CM | POA: Diagnosis not present

## 2020-01-18 DIAGNOSIS — E1140 Type 2 diabetes mellitus with diabetic neuropathy, unspecified: Secondary | ICD-10-CM | POA: Diagnosis not present

## 2020-01-29 DIAGNOSIS — G20 Parkinson's disease: Secondary | ICD-10-CM | POA: Diagnosis not present

## 2020-01-29 DIAGNOSIS — G8929 Other chronic pain: Secondary | ICD-10-CM | POA: Diagnosis not present

## 2020-01-29 DIAGNOSIS — Z9181 History of falling: Secondary | ICD-10-CM | POA: Diagnosis not present

## 2020-02-04 ENCOUNTER — Encounter: Payer: Self-pay | Admitting: Physical Therapy

## 2020-02-04 ENCOUNTER — Ambulatory Visit: Payer: Medicare Other | Attending: Family Medicine | Admitting: Physical Therapy

## 2020-02-04 ENCOUNTER — Other Ambulatory Visit: Payer: Self-pay

## 2020-02-04 DIAGNOSIS — R252 Cramp and spasm: Secondary | ICD-10-CM | POA: Insufficient documentation

## 2020-02-04 DIAGNOSIS — R262 Difficulty in walking, not elsewhere classified: Secondary | ICD-10-CM | POA: Diagnosis not present

## 2020-02-04 DIAGNOSIS — M25551 Pain in right hip: Secondary | ICD-10-CM | POA: Diagnosis not present

## 2020-02-04 DIAGNOSIS — M25651 Stiffness of right hip, not elsewhere classified: Secondary | ICD-10-CM

## 2020-02-04 DIAGNOSIS — R293 Abnormal posture: Secondary | ICD-10-CM | POA: Diagnosis not present

## 2020-02-04 NOTE — Therapy (Signed)
[place], [place], [address] Phone: [phone]   Fax:  [phone]  Physical Therapy Treatment / Discharge  Patient Details  Name: Tyler Lester MRN: [id number] Date of Birth: [birth year] Referring Provider (PT): Talbert Cage, MD   Encounter Date: 02/04/2020   PT End of Session - 02/04/20 1416    Visit Number 5    Number of Visits 12    Date for PT Re-Evaluation 01/13/20    Authorization Type UHC MCR / MCD    Authorization Time Period FOTO visit 6th and 10th visit    Progress Note Due on Visit 10    PT Start Time 1415    PT Stop Time 1445   shortened session due to D/C   PT Time Calculation (min) 30 min    Activity Tolerance Patient tolerated treatment well    Behavior During Therapy [facility] for tasks assessed/performed           Past Medical History:  Diagnosis Date  . Bilateral foot pain   . Chronic pain   . Diabetes mellitus    type 2, diet controlled now  . Foley catheter in place since nov 2018, last changed 1 week ago  . Hyperlipidemia   . Hypertension   . Inguinal hernia    right  . Numbness    T/O  . Parkinson disease ([place]) 08/26/2015   Follow[facility] by [facility]. Last visit 06/03/2015. History of poor compliance with his medication. Referred him back to [place] in 10/2016. They didn't want to see him back due to poor compliance with medical advise. Last plan from 06/03/2015 still valid  . Parkinson's disease ([place])   . Pneumonia yrs ago  . Shortness of breath    with exertion  . Urinary retention     Past Surgical History:  Procedure Laterality Date  . CATARACT EXTRACTION W/ INTRAOCULAR LENS IMPLANT Left 06/2012   laser for posterior capsule?  . COLON SURGERY    . POSTERIOR CERVICAL FUSION/FORAMINOTOMY N/A 11/12/2013   Procedure: Posterior cervical decompression fusion, cervical 3-4, cervical 4-5, cervical 5-6, cervical 6-7 with instrumentation and allograft   (LEVEL 4);  Surgeon: Sinclair Ship, MD;  Location: [facility];  Service: Orthopedics;  Laterality: N/A;  Posterior cervical decompression fusion, cervical 3-4, cervical 4-5, cervical 5-6, cervical 6-7 with instrumentation and allograft  . TONSILLECTOMY    . XI ROBOTIC ASSISTED SIMPLE PROSTATECTOMY N/A 03/07/2016   Procedure: XI ROBOTIC ASSISTED SIMPLE PROSTATECTOMY AND UMBILICAL HERNIA REPAIR flexible cystoscopy;  Surgeon: Alexis Frock, MD;  [facility];  Service: Urology;  Laterality: N/A;    There were no vitals filed for this visit.   Subjective Assessment - 02/04/20 1417    Subjective "I have been having issues with my ride which has limited me from coming in. I have been using tennis ball and i've been doing the exercises at home"    Patient Stated Goals Less pain and to walk better    Currently in Pain? No/denies    Pain Onset More than a month ago    Aggravating Factors  sitting for too long    Pain Relieving Factors riding bike at home, getting up and moving around, using tennis ball.              [facility] PT Assessment - 02/04/20 0001      Assessment   Medical Diagnosis RT ischial pain    Referring Provider (PT) Talbert Cage, MD  Observation/Other Assessments   Focus on Therapeutic Outcomes (FOTO)  22% limited                         [facility] Adult PT Treatment/Exercise - 02/04/20 0001      Knee/Hip Exercises: Stretches   Active Hamstring Stretch Left;2 reps;30 seconds    Piriformis Stretch 2 reps;30 seconds;Left;Right      Knee/Hip Exercises: Standing   Hip Flexion Stengthening;Both;2 sets;10 reps    Hip Abduction 2 sets;Both;Stengthening;10 reps    Other Standing Knee Exercises standing marching 1 x 10 with 1 HHA for safety   cues for big movements     Knee/Hip Exercises: Seated   Marching 2 sets;10 reps;Strengthening;Both    Sit to Sand 1 set;10 reps                  PT Education - 02/04/20 1455    Education Details Reviewed and updated HEP with  progression of strenthening. Reviewed benefits of continued strengthening. pt inquired about a lift chair educated that a lift chair helps people who are unable to get out of chair, based on his function he has ability to get out of a chair without the use of his UE and a lift chair will only contribute to weakness secondary to disuse.    Person(s) Educated Patient    Methods Explanation;Handout;Verbal cues    Comprehension Verbalized understanding;Verbal cues required            PT Short Term Goals - 02/04/20 1426      PT SHORT TERM GOAL #1   Title He will be able to demo HEP with minor set up cues    Period Weeks    Status Achieved      PT SHORT TERM GOAL #2   Title He will report pain decreased 30% or more    Period Weeks    Status Achieved      PT SHORT TERM GOAL #3   Title FOTO score improved 5 points    Period Weeks    Status Achieved             PT Long Term Goals - 02/04/20 1427      PT LONG TERM GOAL #1   Title He will be independent with all hEP with assist from care giver if needed      PT LONG TERM GOAL #2   Title He will met FOTO score predicted    Period Weeks    Status Achieved      PT LONG TERM GOAL #3   Title He will report pain as intermittant and no more than mild.    Period Weeks    Status Achieved      PT LONG TERM GOAL #4   Title He will report able to walk a block without increased pain    Period Weeks    Status Partially Met                 Plan - 02/04/20 1503    Clinical Impression Statement Mr. Tasia Catchings has made great progress with physical therapy reported no pain today and only notes it occurs with prolonged sitting. He performed all exercises well with minimal cues for big purposeful motions secondary to his parkinsons.  . Dicussed benefits of getting a new chair and effect of a lift chair and based on his current level of function that it wouldn't be helpfull as it would increase weakness  in his LE. pt verbalized understanding  regarding the lift chair. He met or partially met all goals today and is able to maintain and progress current LOF independently and will be discharged from PT today.    PT Next Visit Plan D/C today    PT Home Exercise Plan [id number]    Consulted and Agree with Plan of Care Patient           Patient will benefit from skilled therapeutic intervention in order to improve the following deficits and impairments:     Visit Diagnosis: Right-sided ischial pain  Difficulty in walking, not elsewhere classified  Right hip pain  Stiffness of right hip, not elsewhere classified  Cramp and spasm  Abnormal posture     Problem List Patient Active Problem List   Diagnosis Date Noted  . Ischial pain, right 08/25/2019  . Palpitations 11/24/2018  . Onychomycosis 08/05/2017  . Chronic bilateral low back pain with bilateral sciatica 01/30/2017  . Memory change 11/09/2016  . Hyperlipidemia 11/05/2016  . Diabetic polyneuropathy associated with type 2 diabetes mellitus ([place]) 04/13/2016  . 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 Diminished hearing, left 01/20/2016  . Type 2 diabetes mellitus with complication ([place]) 42/35/3614  . Gait abnormality 03/28/2015  . Postlaminectomy syndrome, cervical region 07/23/2014  . Parkinson's disease ([place]) 07/23/2014  . Back pain with sciatica 05/27/2014  . Bilateral leg pain 05/14/2014  . Depression 10/15/2013  . CKD (chronic kidney disease) stage 3, GFR 30-59 ml/min (HCC) 08/25/2013  . Tremor 05/23/2013  . Chronic diastolic heart failure ([place]) 05/06/2013  . Onychogryposis of toenail 05/14/2012  . GERD (gastroesophageal reflux disease) 07/04/2011  . Essential hypertension 01/05/2011   Starr Lake PT, DPT, LAT, ATC  02/04/20  3:10 PM      [facility] [address] [place], [place], [address] Phone: [phone]   Fax:  [phone]  Name: ESTER MABE MRN: [id number] Date of  Birth: October 03, 1945      PHYSICAL THERAPY DISCHARGE SUMMARY  Visits from Start of Care: 5  Current functional level related to goals / functional outcomes: See goals, FOTO 22% limited   Remaining deficits: See assessment   Education / Equipment: HEP, posture, benefits of replacing his recliner with a regular chair versus a lift chair to potential repercussions of weakness.   Plan: Patient agrees to discharge.  Patient goals were partially met. Patient is being discharged due to meeting the stated rehab goals.  ?????         Delano Scardino PT, DPT, LAT, ATC  02/04/20  3:12 PM

## 2020-02-05 DIAGNOSIS — M5442 Lumbago with sciatica, left side: Secondary | ICD-10-CM | POA: Diagnosis not present

## 2020-02-05 DIAGNOSIS — Z9181 History of falling: Secondary | ICD-10-CM | POA: Diagnosis not present

## 2020-02-05 DIAGNOSIS — G8929 Other chronic pain: Secondary | ICD-10-CM | POA: Diagnosis not present

## 2020-02-05 DIAGNOSIS — Z79899 Other long term (current) drug therapy: Secondary | ICD-10-CM | POA: Diagnosis not present

## 2020-02-05 DIAGNOSIS — M5441 Lumbago with sciatica, right side: Secondary | ICD-10-CM | POA: Diagnosis not present

## 2020-02-05 DIAGNOSIS — M109 Gout, unspecified: Secondary | ICD-10-CM | POA: Diagnosis not present

## 2020-02-25 ENCOUNTER — Ambulatory Visit: Payer: Medicaid Other | Admitting: Podiatry

## 2020-03-01 ENCOUNTER — Emergency Department (HOSPITAL_COMMUNITY): Payer: 59

## 2020-03-01 ENCOUNTER — Other Ambulatory Visit: Payer: Self-pay

## 2020-03-01 ENCOUNTER — Observation Stay (HOSPITAL_COMMUNITY): Payer: 59

## 2020-03-01 ENCOUNTER — Observation Stay (HOSPITAL_COMMUNITY)
Admission: EM | Admit: 2020-03-01 | Discharge: 2020-03-03 | Disposition: A | Discharge status: Home / Self Care | Payer: 59 | Attending: Internal Medicine | Admitting: Internal Medicine

## 2020-03-01 DIAGNOSIS — Z20822 Contact with and (suspected) exposure to covid-19: Secondary | ICD-10-CM | POA: Insufficient documentation

## 2020-03-01 DIAGNOSIS — E785 Hyperlipidemia, unspecified: Secondary | ICD-10-CM | POA: Diagnosis present

## 2020-03-01 DIAGNOSIS — I130 Hypertensive heart and chronic kidney disease with heart failure and stage 1 through stage 4 chronic kidney disease, or unspecified chronic kidney disease: Secondary | ICD-10-CM | POA: Diagnosis not present

## 2020-03-01 DIAGNOSIS — R2689 Other abnormalities of gait and mobility: Secondary | ICD-10-CM | POA: Diagnosis not present

## 2020-03-01 DIAGNOSIS — I5032 Chronic diastolic (congestive) heart failure: Secondary | ICD-10-CM | POA: Insufficient documentation

## 2020-03-01 DIAGNOSIS — Z7984 Long term (current) use of oral hypoglycemic drugs: Secondary | ICD-10-CM | POA: Diagnosis not present

## 2020-03-01 DIAGNOSIS — R29818 Other symptoms and signs involving the nervous system: Principal | ICD-10-CM | POA: Insufficient documentation

## 2020-03-01 DIAGNOSIS — E1140 Type 2 diabetes mellitus with diabetic neuropathy, unspecified: Secondary | ICD-10-CM | POA: Diagnosis not present

## 2020-03-01 DIAGNOSIS — Z79899 Other long term (current) drug therapy: Secondary | ICD-10-CM | POA: Diagnosis not present

## 2020-03-01 DIAGNOSIS — G20 Parkinson's disease: Secondary | ICD-10-CM | POA: Diagnosis not present

## 2020-03-01 DIAGNOSIS — E118 Type 2 diabetes mellitus with unspecified complications: Secondary | ICD-10-CM | POA: Diagnosis present

## 2020-03-01 DIAGNOSIS — R531 Weakness: Secondary | ICD-10-CM | POA: Diagnosis present

## 2020-03-01 DIAGNOSIS — R2681 Unsteadiness on feet: Secondary | ICD-10-CM | POA: Diagnosis not present

## 2020-03-01 DIAGNOSIS — M961 Postlaminectomy syndrome, not elsewhere classified: Secondary | ICD-10-CM | POA: Diagnosis present

## 2020-03-01 DIAGNOSIS — N183 Chronic kidney disease, stage 3 unspecified: Secondary | ICD-10-CM | POA: Diagnosis not present

## 2020-03-01 DIAGNOSIS — N179 Acute kidney failure, unspecified: Secondary | ICD-10-CM

## 2020-03-01 LAB — CBC WITH DIFFERENTIAL/PLATELET
Abs Immature Granulocytes: 0.02 10*3/uL (ref 0.00–0.07)
Basophils Absolute: 0 10*3/uL (ref 0.0–0.1)
Basophils Relative: 0 %
Eosinophils Absolute: 0 10*3/uL (ref 0.0–0.5)
Eosinophils Relative: 1 %
HCT: 49.9 % (ref 39.0–52.0)
Hemoglobin: 14.9 g/dL (ref 13.0–17.0)
Immature Granulocytes: 0 %
Lymphocytes Relative: 26 %
Lymphs Abs: 1.7 10*3/uL (ref 0.7–4.0)
MCH: 26.8 pg (ref 26.0–34.0)
MCHC: 29.9 g/dL — ABNORMAL LOW (ref 30.0–36.0)
MCV: 89.6 fL (ref 80.0–100.0)
Monocytes Absolute: 0.5 10*3/uL (ref 0.1–1.0)
Monocytes Relative: 8 %
Neutro Abs: 4.3 10*3/uL (ref 1.7–7.7)
Neutrophils Relative %: 65 %
Platelets: 204 10*3/uL (ref 150–400)
RBC: 5.57 MIL/uL (ref 4.22–5.81)
RDW: 13.4 % (ref 11.5–15.5)
WBC: 6.5 10*3/uL (ref 4.0–10.5)
nRBC: 0 % (ref 0.0–0.2)

## 2020-03-01 LAB — COMPREHENSIVE METABOLIC PANEL
ALT: <5 U/L (ref 0–44)
AST: 12 U/L — ABNORMAL LOW (ref 15–41)
Albumin: 3.7 g/dL (ref 3.5–5.0)
Alkaline Phosphatase: 40 U/L (ref 38–126)
Anion gap: 9 (ref 5–15)
BUN: 52 mg/dL — ABNORMAL HIGH (ref 8–23)
CO2: 25 mmol/L (ref 22–32)
Calcium: 9.8 mg/dL (ref 8.9–10.3)
Chloride: 109 mmol/L (ref 98–111)
Creatinine, Ser: 2.63 mg/dL — ABNORMAL HIGH (ref 0.61–1.24)
GFR, Estimated: 25 mL/min — ABNORMAL LOW (ref >60)
Glucose, Bld: 104 mg/dL — ABNORMAL HIGH (ref 70–99)
Potassium: 4.6 mmol/L (ref 3.5–5.1)
Sodium: 143 mmol/L (ref 135–145)
Total Bilirubin: 1 mg/dL (ref 0.3–1.2)
Total Protein: 7.7 g/dL (ref 6.5–8.1)

## 2020-03-01 LAB — RESP PANEL BY RT-PCR (FLU A&B, COVID) ARPGX2
Influenza A by PCR: NEGATIVE
Influenza B by PCR: NEGATIVE
SARS Coronavirus 2 by RT PCR: NEGATIVE

## 2020-03-01 MED ORDER — LACTATED RINGERS IV BOLUS
500.0000 mL | Freq: Once | INTRAVENOUS | Status: AC
  Administered 2020-03-01: 500 mL via INTRAVENOUS

## 2020-03-01 MED ORDER — SODIUM CHLORIDE 0.9 % IV SOLN: INTRAVENOUS | Status: AC

## 2020-03-01 MED ORDER — SENNOSIDES-DOCUSATE SODIUM 8.6-50 MG PO TABS: 1.0000 | ORAL_TABLET | Freq: Every evening | ORAL | Status: DC | PRN

## 2020-03-01 MED ORDER — POTASSIUM CHLORIDE CRYS ER 20 MEQ PO TBCR: 20.0000 meq | EXTENDED_RELEASE_TABLET | Freq: Every day | ORAL | Status: DC

## 2020-03-01 MED ORDER — ACETAMINOPHEN 650 MG RE SUPP: 650.0000 mg | RECTAL | Status: DC | PRN

## 2020-03-01 MED ORDER — LISINOPRIL-HYDROCHLOROTHIAZIDE 20-25 MG PO TABS: 1.0000 | ORAL_TABLET | Freq: Every day | ORAL | Status: DC

## 2020-03-01 MED ORDER — HYDROCHLOROTHIAZIDE 25 MG PO TABS
25.0000 mg | ORAL_TABLET | Freq: Every day | ORAL | Status: DC
  Administered 2020-03-02 – 2020-03-03 (×2): 25 mg via ORAL
  Filled 2020-03-01 (×2): qty 1

## 2020-03-01 MED ORDER — DONEPEZIL HCL 10 MG PO TABS
10.0000 mg | ORAL_TABLET | Freq: Every day | ORAL | Status: DC
  Administered 2020-03-02 – 2020-03-03 (×2): 10 mg via ORAL
  Filled 2020-03-01: qty 1
  Filled 2020-03-01: qty 2

## 2020-03-01 MED ORDER — ALLOPURINOL 100 MG PO TABS
300.0000 mg | ORAL_TABLET | Freq: Every day | ORAL | Status: DC
  Administered 2020-03-02 – 2020-03-03 (×2): 300 mg via ORAL
  Filled 2020-03-01 (×2): qty 3

## 2020-03-01 MED ORDER — SODIUM CHLORIDE 0.9 % IV SOLN: INTRAVENOUS | Status: DC

## 2020-03-01 MED ORDER — FUROSEMIDE 20 MG PO TABS: 40.0000 mg | ORAL_TABLET | Freq: Every day | ORAL | Status: DC

## 2020-03-01 MED ORDER — STROKE: EARLY STAGES OF RECOVERY BOOK
Freq: Once | Status: AC
  Filled 2020-03-01: qty 1

## 2020-03-01 MED ORDER — ACETAMINOPHEN 160 MG/5ML PO SOLN: 650.0000 mg | ORAL | Status: DC | PRN

## 2020-03-01 MED ORDER — OXYCODONE-ACETAMINOPHEN 7.5-325 MG PO TABS: 1.0000 | ORAL_TABLET | Freq: Four times a day (QID) | ORAL | Status: DC | PRN

## 2020-03-01 MED ORDER — INSULIN ASPART 100 UNIT/ML ~~LOC~~ SOLN: 0.0000 [IU] | Freq: Three times a day (TID) | SUBCUTANEOUS | Status: DC

## 2020-03-01 MED ORDER — ASPIRIN EC 81 MG PO TBEC
81.0000 mg | DELAYED_RELEASE_TABLET | Freq: Every day | ORAL | Status: DC
  Administered 2020-03-02 – 2020-03-03 (×2): 81 mg via ORAL
  Filled 2020-03-01 (×2): qty 1

## 2020-03-01 MED ORDER — CARBIDOPA-LEVODOPA 25-100 MG PO TABS
1.0000 | ORAL_TABLET | Freq: Four times a day (QID) | ORAL | Status: DC
  Administered 2020-03-01: 1 via ORAL
  Filled 2020-03-01 (×3): qty 1.5

## 2020-03-01 MED ORDER — GABAPENTIN 300 MG PO CAPS
300.0000 mg | ORAL_CAPSULE | Freq: Every day | ORAL | Status: DC
  Administered 2020-03-02: 300 mg via ORAL
  Filled 2020-03-01 (×2): qty 1

## 2020-03-01 MED ORDER — ACETAMINOPHEN 325 MG PO TABS: 650.0000 mg | ORAL_TABLET | ORAL | Status: DC | PRN

## 2020-03-01 MED ORDER — LISINOPRIL 20 MG PO TABS
20.0000 mg | ORAL_TABLET | Freq: Every day | ORAL | Status: DC
  Administered 2020-03-02 – 2020-03-03 (×2): 20 mg via ORAL
  Filled 2020-03-01 (×2): qty 1

## 2020-03-01 MED ORDER — ENOXAPARIN SODIUM 30 MG/0.3ML ~~LOC~~ SOLN
30.0000 mg | SUBCUTANEOUS | Status: DC
  Administered 2020-03-01 – 2020-03-02 (×2): 30 mg via SUBCUTANEOUS
  Filled 2020-03-01 (×2): qty 0.3

## 2020-03-01 MED ORDER — ATORVASTATIN CALCIUM 10 MG PO TABS
20.0000 mg | ORAL_TABLET | Freq: Every day | ORAL | Status: DC
  Administered 2020-03-02 – 2020-03-03 (×2): 20 mg via ORAL
  Filled 2020-03-01 (×2): qty 2

## 2020-03-01 NOTE — ED Triage Notes (Signed)
To ED [facility] from home with caregiver c/o left leg pain, increased drooling for 2- 3 days--- has hx of parkinson's ,  EMS VS 114/70, P-98, R -22. O2 Sats 97% CBG 130

## 2020-03-01 NOTE — ED Notes (Signed)
Patient transported to MRI 

## 2020-03-01 NOTE — ED Notes (Signed)
Have patient call Lauraine Rinne care giver at [phone] when he gets in a room

## 2020-03-01 NOTE — H&P (Addendum)
History and Physical   Tyler Lester TIR:[id number] DOB: 1945/06/29 DOA: 03/01/2020  PCP: Melene Plan, MD (new PCP at [facility] per report)   Patient coming from: Home  Chief Complaint: Left-sided weakness  HPI: Tyler Lester is a 75 y.o. male with medical history significant of Parkinson's disease, back pain, BPH, CHF, CKD 3, depression, diabetes, hypertension, GERD, hyperlipidemia, gout who presents with 1 day of left-sided weakness.  History obtained with assistance of caregiver.  Patient seemed to be at his baseline yesterday evening he was able to walk with his walker.  Today caregiver came to see him and he was unable to even stand up and he needed significant help and was struggling even with significant help.  He does report that the did seem to start last night but was not significant at that time.  He is also had some increased drooling on his right side.  Denies numbness, vision changes, hearing changes.  Delay in 5 movements but is able to he denies fever, chills, cough, shortness of breath, chest pain, abdominal pain, nausea, vomiting, constipation, diarrhea   ED Course: Vital signs in the ED significant for blood pressure in the 160s to 180s systolic, heart rate in the 80s to 90s, saturating well on room air.  Lab work-up showed BMP with creatinine of 2.6 elevated from baseline of 1.6.  LFTs within normal limits.  CBC within normal limits.  Respiratory panel for flu and Covid pending urinalysis pending.  Chest x-ray showed atelectasis versus pneumonia in the left lower lobe.  CT head was normal.  Neurology was consulted and recommend MRI of brain as well as C-spine as this has been scheduled outpatient has a history of C-spine surgery.  Review of Systems: As per HPI otherwise all other systems reviewed and are negative.  Past Medical History:  Diagnosis Date  . Bilateral foot pain   . Chronic pain   . Diabetes mellitus    type 2, diet controlled now  . Foley catheter in  place since nov 2018, last changed 1 week ago  . Hyperlipidemia   . Hypertension   . Inguinal hernia    right  . Numbness    T/O  . Parkinson disease (HCC) 08/26/2015   Followed by [facility]. Last visit 06/03/2015. History of poor compliance with his medication. Referred him back to [facility] in 10/2016. They didn't want to see him back due to poor compliance with medical advise. Last plan from 06/03/2015 still valid  . Parkinson's disease (HCC)   . Pneumonia yrs ago  . Shortness of breath    with exertion  . Urinary retention     Past Surgical History:  Procedure Laterality Date  . CATARACT EXTRACTION W/ INTRAOCULAR LENS IMPLANT Left 06/2012   laser for posterior capsule?  . COLON SURGERY    . POSTERIOR CERVICAL FUSION/FORAMINOTOMY N/A 11/12/2013   Procedure: Posterior cervical decompression fusion, cervical 3-4, cervical 4-5, cervical 5-6, cervical 6-7 with instrumentation and allograft   (LEVEL 4);  Surgeon: Emilee Hero, MD;  Location: [facility] OR;  Service: Orthopedics;  Laterality: N/A;  Posterior cervical decompression fusion, cervical 3-4, cervical 4-5, cervical 5-6, cervical 6-7 with instrumentation and allograft  . TONSILLECTOMY    . XI ROBOTIC ASSISTED SIMPLE PROSTATECTOMY N/A 03/07/2016   Procedure: XI ROBOTIC ASSISTED SIMPLE PROSTATECTOMY AND UMBILICAL HERNIA REPAIR flexible cystoscopy;  Surgeon: Sebastian Ache, MD;  Location: [facility] ORS;  Service: Urology;  Laterality: N/A;    Social History  reports that he  has never smoked. He has never used smokeless tobacco. He reports current alcohol use. He reports that he does not use drugs.  Allergies  Allergen Reactions  . Poison Ivy Treatments Hives    Pt denies allergy to this medication    Family History  Problem Relation Age of Onset  . Cancer - Other Other   . Diabetes Other   . Hypertension Other   Reviewed on admission  Prior to Admission medications   Medication Sig Start Date End Date Taking? Authorizing Provider  allopurinol  (ZYLOPRIM) 300 MG tablet Take 300 mg by mouth daily. 02/21/20   [provider]  atorvastatin (LIPITOR) 20 MG tablet Take 20 mg by mouth daily. 02/21/20   [provider]  carbidopa-levodopa (SINEMET) 25-100 MG tablet Take 1-1.5 tablets by mouth 4 (four) times daily. Take 3-4 hours apart such at 8am, 11-12am, 2-4pm and 6-8pm for example. Avoid high protein diet. 11/21/19 02/19/20  Melene Plan, MD  cephALEXin [facility]) 500 MG capsule Take by mouth. 01/06/20   [provider]  cyclobenzaprine (FLEXERIL) 5 MG tablet Take 5 mg by mouth 3 (three) times daily as needed. 02/05/20   [provider]  donepezil (ARICEPT) 10 MG tablet Take 1 tablet by mouth every day 12/15/19   Melene Plan, MD  fluticasone [facility]) 50 MCG/ACT nasal spray Place into both nostrils. 02/21/20   [provider]  furosemide (LASIX) 40 MG tablet Take 40 mg by mouth daily. 01/06/20   [provider]  gabapentin (NEURONTIN) 300 MG capsule Take 300 mg by mouth daily. 02/05/20   [provider]  lisinopril-hydrochlorothiazide (ZESTORETIC) 20-25 MG tablet Take 1 tablet by mouth daily. 02/10/20   [provider]  metFORMIN (GLUCOPHAGE) 500 MG tablet Take 500 mg by mouth 2 (two) times daily. 11/08/19   [provider]  oxyCODONE-acetaminophen (PERCOCET) 7.5-325 MG tablet Take 1 tablet by mouth 4 (four) times daily as needed. 09/10/19   [provider]  Potassium Chloride ER 20 MEQ TBCR Take 1 tablet by mouth daily. 02/05/20   [provider]  terbinafine (LAMISIL) 250 MG tablet Take 250 mg by mouth daily. 09/06/19   [provider]  Vitamin D, Ergocalciferol, (DRISDOL) 1.25 MG (50000 UNIT) CAPS capsule Take 50,000 Units by mouth once a week. 02/04/20   [provider]    Physical Exam: Vitals:   03/01/20 2030 03/01/20 2100 03/01/20 2130 03/01/20 2215  BP: (!) 174/158 (!) 160/70 (!) 162/120 (!) 143/102  Pulse: 90 98     Resp: 15 14 20  (!) 23  Temp:      TempSrc:      SpO2: 99% 98%     Physical Exam Constitutional:      General: He is not in acute distress.    Appearance: Normal appearance.  HENT:     Head: Normocephalic and atraumatic.     Mouth/Throat:     Mouth: Mucous membranes are moist.     Pharynx: Oropharynx is clear.  Eyes:     Extraocular Movements: Extraocular movements intact.     Pupils: Pupils are equal, round, and reactive to light.  Cardiovascular:     Rate and Rhythm: Normal rate and regular rhythm.     Pulses: Normal pulses.     Heart sounds: Normal heart sounds.  Pulmonary:     Effort: Pulmonary effort is normal. No respiratory distress.     Breath sounds: Normal breath sounds.  Abdominal:     General: Bowel sounds  are normal. There is no distension.     Palpations: Abdomen is soft.     Tenderness: There is no abdominal tenderness.  Musculoskeletal:        General: No swelling or deformity.     Right lower leg: Edema present.     Left lower leg: Edema present.     Comments: Tremor  Skin:    General: Skin is warm and dry.  Neurological:     Comments: Mental Status: Patient is awake, alert, oriented x3 No signs of aphasia or neglect Cranial Nerves: II: Pupils equal, round, and reactive to light.  III,IV, VI: EOMI without ptosis or diploplia.  Delayed eye movements noted but is able to follow fingers. V: Facial sensation is symmetric tolight touch. VII: Facial movement is symmetric.  VIII: hearing is intact to voice X: Uvula elevates symmetrically XI: Shoulder shrug is symmetric. XII: tongue is midline without atrophy or fasciculations.  Motor: Okay effort thorughout, at Least 5/5 bilateral UE, 4/5 left lower extremity, 5/5 right lower extremity. Sensory: Sensation is grossly intact bilateral UEs & LEs    Labs on Admission: I have personally reviewed following labs and imaging studies  CBC: Recent Labs  Lab 03/01/20 1321  WBC 6.5  NEUTROABS 4.3  HGB 14.9   HCT 49.9  MCV 89.6  PLT 0000000    Basic Metabolic Panel: Recent Labs  Lab 03/01/20 1321  NA 143  K 4.6  CL 109  CO2 25  GLUCOSE 104*  BUN 52*  CREATININE 2.63*  CALCIUM 9.8    GFR: CrCl cannot be calculated (Unknown ideal weight.).  Liver Function Tests: Recent Labs  Lab 03/01/20 1321  AST 12*  ALT <5  ALKPHOS 40  BILITOT 1.0  PROT 7.7  ALBUMIN 3.7    Urine analysis:    Component Value Date/Time   COLORURINE YELLOW 09/25/2019 1944   APPEARANCEUR CLEAR 09/25/2019 1944   LABSPEC 1.014 09/25/2019 1944   PHURINE 6.0 09/25/2019 1944   GLUCOSEU NEGATIVE 09/25/2019 1944   HGBUR NEGATIVE 09/25/2019 1944   BILIRUBINUR NEGATIVE 09/25/2019 1944   BILIRUBINUR NEGATIVE 05/27/2014 1020   KETONESUR NEGATIVE 09/25/2019 1944   PROTEINUR NEGATIVE 09/25/2019 1944   UROBILINOGEN 1.0 10/29/2014 0042   NITRITE NEGATIVE 09/25/2019 1944   LEUKOCYTESUR NEGATIVE 09/25/2019 1944    Radiological Exams on Admission: DG Chest 2 View  Result Date: 03/01/2020 CLINICAL DATA:  Hypotension. Additional history provided: Patient reports left leg pain and hypotension today. EXAM: CHEST - 2 VIEW COMPARISON:  Prior chest radiograph 09/25/2019. FINDINGS: Heart size within normal limits. Ill-defined opacity within the left lung base. The right lung is clear. No evidence of pleural effusion or pneumothorax. No acute bony abnormality is identified. IMPRESSION: Ill-defined opacity within the left lung base, which may reflect pneumonia or atelectasis. Radiographic follow-up to resolution is recommended. Electronically Signed   By: Kellie Simmering DO   On: 03/01/2020 13:38   CT Head Wo Contrast  Result Date: 03/01/2020 CLINICAL DATA:  Drooling for several days, Parkinson's disease, left leg pain EXAM: CT HEAD WITHOUT CONTRAST TECHNIQUE: Contiguous axial images were obtained from the base of the skull through the vertex without intravenous contrast. COMPARISON:  09/25/2019 FINDINGS: Brain: No acute infarct or  hemorrhage. Lateral ventricles and midline structures are unremarkable. No acute extra-axial fluid collections. No mass effect. Vascular: No hyperdense vessel or unexpected calcification. Skull: Normal. Negative for fracture or focal lesion. Sinuses/Orbits: No acute finding. Other: None. IMPRESSION: 1. Stable head CT, no acute process. Electronically Signed  By: Randa Ngo M.D.   On: 03/01/2020 20:17    EKG: Independently reviewed.  Normal sinus rhythm rate of 89 bpm.  Assessment/Plan Principal Problem:   Acute focal neurological deficit Active Problems:   Acute renal failure superimposed on stage 3 chronic kidney disease (HCC)   CKD (chronic kidney disease) stage 3, GFR 30-59 ml/min (HCC)   Postlaminectomy syndrome, cervical region   Parkinson's disease ([place])   Type 2 diabetes mellitus with complication (HCC)   Hyperlipidemia   Acute focal neurologic deficit Concern for TIA versus CVA History of C-spine surgery > Increase suspicion for stroke due to left-sided lower extremity weakness and right mild facial droop with increased drooling. - Neurology consulted in ED and recommend MRI of brain as well as C-spine considering history of C-spine surgery - Allow for permissive HTN, systolic < [id number] and diastolic < 123456 - ASA 81 mg daily  - Continue atorvastatin - Echocardiogram  - Carotid doppler  - A1C  - Lipid panel  - Tele monitoring  - SLP eval - PT/OT  AKI on CKD 3 > Creatinine elevated to 2.6 from a baseline of 1.6 > 500 cc bolus in ED - Continue gentle IV fluids overnight - Avoid nephrotoxic agents - Trend renal function electrolytes  LE edema > CHF in chart but last echo with EF of 123456 and no diastolic dysfunction - Hold Lasix and potassium in setting of AKI  Parkinson's disease > Has not seen a neurologist for a while per the report as they were dismissed due to missing visits and have been unable to get new referral while changing PCP. - Continue home levodopa  carbidopa - Continue donepezil  Chest x-ray abnormality > Infiltrate versus atelectasis - Favor atelectasis at this time as there is no fever, no leukocytosis and no systemic symptoms  Diabetes - SSI  Hyperlipidemia - Continue atorvastatin  Gout - Continue allopurinol   DVT prophylaxis: Lovenox  Code Status:   Full  Family Communication:  Caregiver updated at bedside  Disposition Plan:   Patient is from:  Home  Anticipated DC to:  Home  Anticipated DC date:  Pending clinical course  Anticipated DC barriers: None  Consults called:  Neurology consulted by EDP  Admission status:  Observation, telemetry  Severity of Illness: The appropriate patient status for this patient is OBSERVATION. Observation status is judged to be reasonable and necessary in order to provide the required intensity of service to ensure the patient's safety. The patient's presenting symptoms, physical exam findings, and initial radiographic and laboratory data in the context of their medical condition is felt to place them at decreased risk for further clinical deterioration. Furthermore, it is anticipated that the patient will be medically stable for discharge from the hospital within 2 midnights of admission. The following factors support the patient status of observation.   " The patient's presenting symptoms include left-sided weakness and right-sided drooling. " The physical exam findings include left-sided weakness mild right facial droop. " The initial radiographic and laboratory data are concerning for AKI with creatinine of 2.6 from baseline 1.6..   Marcelyn Bruins MD [facility]  How to contact the [facility] Attending or Consulting provider [place] or covering provider during after hours [place], for this patient?   1. Check the care team in [facility] and look for a) attending/consulting [facility] provider listed and b) the [facility] team listed 2. Log into [email or web address] and use [place]'s universal password  to access. If you do  not have the password, please contact the hospital operator. 3. Locate the [facility] provider you are looking for under [facility] and page to a number that you can be directly reached. 4. If you still have difficulty reaching the provider, please page the [facility] (Director on Call) for the Hospitalists listed on amion for assistance.  03/01/2020, 10:28 PM

## 2020-03-01 NOTE — ED Provider Notes (Signed)
[facility] EMERGENCY DEPARTMENT Provider Note   CSN: [id number] Arrival date & time: 03/01/20  1308     History Chief Complaint  Patient presents with  . stroke sx    Tyler Lester is a 75 y.o. male.  Patient is a 75 year old male with a history of Parkinson's disease, urinary tension with indwelling Foley catheter, hypertension, diabetes, hyperlipidemia who is presenting today with complaint of left-sided weakness.  He reports that it started last night before he went to bed but today has been more severe and he has been unable to walk or get out of his chair.  He denies any pain at this time.  He has not had nausea, vomiting, chest pain or abdominal pain.  He denies any shortness of breath or cough.  He has not had any recent changes in his medications and is still taking his Parkinson's medicine.  The history is provided by the patient.       Past Medical History:  Diagnosis Date  . Bilateral foot pain   . Chronic pain   . Diabetes mellitus    type 2, diet controlled now  . Foley catheter in place since nov 2018, last changed 1 week ago  . Hyperlipidemia   . Hypertension   . Inguinal hernia    right  . Numbness    T/O  . Parkinson disease ([place]) 08/26/2015   Followed by [facility]. Last visit 06/03/2015. History of poor compliance with his medication. Referred him back to [facility] in 10/2016. They didn't want to see him back due to poor compliance with medical advise. Last plan from 06/03/2015 still valid  . Parkinson's disease ([place])   . Pneumonia yrs ago  . Shortness of breath    with exertion  . Urinary retention     Patient Active Problem List   Diagnosis Date Noted  . Ischial pain, right 08/25/2019  . Palpitations 11/24/2018  . Onychomycosis 08/05/2017  . Chronic bilateral low back pain with bilateral sciatica 01/30/2017  . Memory change 11/09/2016  . Hyperlipidemia 11/05/2016  . Diabetic polyneuropathy associated with type 2 diabetes mellitus ([place])  04/13/2016  . 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 Diminished hearing, left 01/20/2016  . Type 2 diabetes mellitus with complication ([place]) [id number]  . Gait abnormality 03/28/2015  . Postlaminectomy syndrome, cervical region 07/23/2014  . Parkinson's disease ([place]) 07/23/2014  . Back pain with sciatica 05/27/2014  . Bilateral leg pain 05/14/2014  . Depression 10/15/2013  . CKD (chronic kidney disease) stage 3, GFR 30-59 ml/min (HCC) 08/25/2013  . Tremor 05/23/2013  . Chronic diastolic heart failure ([place]) 05/06/2013  . Onychogryposis of toenail 05/14/2012  . GERD (gastroesophageal reflux disease) 07/04/2011  . Essential hypertension 01/05/2011    Past Surgical History:  Procedure Laterality Date  . CATARACT EXTRACTION W/ INTRAOCULAR LENS IMPLANT Left 06/2012   laser for posterior capsule?  . COLON SURGERY    . POSTERIOR CERVICAL FUSION/FORAMINOTOMY N/A 11/12/2013   Procedure: Posterior cervical decompression fusion, cervical 3-4, cervical 4-5, cervical 5-6, cervical 6-7 with instrumentation and allograft   (LEVEL 4);  Surgeon: Sinclair Ship, MD;  Location: [facility];  Service: Orthopedics;  Laterality: N/A;  Posterior cervical decompression fusion, cervical 3-4, cervical 4-5, cervical 5-6, cervical 6-7 with instrumentation and allograft  . TONSILLECTOMY    . XI ROBOTIC ASSISTED SIMPLE PROSTATECTOMY N/A 03/07/2016   Procedure: XI ROBOTIC ASSISTED SIMPLE PROSTATECTOMY AND UMBILICAL HERNIA REPAIR flexible cystoscopy;  Surgeon: Alexis Frock, MD;  Location: [facility];  Service: Urology;  Laterality: N/A;       Family History  Problem Relation Age of Onset  . Cancer - Other Other   . Diabetes Other   . Hypertension Other     Social History   Tobacco Use  . Smoking status: Never Smoker  . Smokeless tobacco: Never Used  Vaping Use  . Vaping Use: Never used  Substance Use Topics  . Alcohol use: Yes    Alcohol/week: 0.0 standard drinks    Comment: 1 shot a day  .  Drug use: No    Home Medications Prior to Admission medications   Medication Sig Start Date End Date Taking? Authorizing Provider  allopurinol (ZYLOPRIM) 300 MG tablet Take 300 mg by mouth daily. 02/21/20   [provider]  atorvastatin (LIPITOR) 20 MG tablet Take 20 mg by mouth daily. 02/21/20   [provider]  carbidopa-levodopa (SINEMET) 25-100 MG tablet Take 1-1.5 tablets by mouth 4 (four) times daily. Take 3-4 hours apart such at 8am, 11-12am, 2-4pm and 6-8pm for example. Avoid high protein diet. 11/21/19 02/19/20  Melene Plan, MD  cephALEXin [facility]) 500 MG capsule Take by mouth. 01/06/20   [provider]  cyclobenzaprine (FLEXERIL) 5 MG tablet Take 5 mg by mouth 3 (three) times daily as needed. 02/05/20   [provider]  donepezil (ARICEPT) 10 MG tablet Take 1 tablet by mouth every day 12/15/19   Melene Plan, MD  fluticasone [facility]) 50 MCG/ACT nasal spray Place into both nostrils. 02/21/20   [provider]  furosemide (LASIX) 40 MG tablet Take 40 mg by mouth daily. 01/06/20   [provider]  gabapentin (NEURONTIN) 300 MG capsule Take 300 mg by mouth daily. 02/05/20   [provider]  lisinopril-hydrochlorothiazide (ZESTORETIC) 20-25 MG tablet Take 1 tablet by mouth daily. 02/10/20   [provider]  metFORMIN (GLUCOPHAGE) 500 MG tablet Take 500 mg by mouth 2 (two) times daily. 11/08/19   [provider]  oxyCODONE-acetaminophen (PERCOCET) 7.5-325 MG tablet Take 1 tablet by mouth 4 (four) times daily as needed. 09/10/19   [provider]  Potassium Chloride ER 20 MEQ TBCR Take 1 tablet by mouth daily. 02/05/20   [provider]  terbinafine (LAMISIL) 250 MG tablet Take 250 mg by mouth daily. 09/06/19   [provider]  Vitamin D, Ergocalciferol, (DRISDOL) 1.25 MG (50000 UNIT) CAPS capsule Take 50,000 Units by mouth once a week. 02/04/20   [provider]     Allergies    Poison ivy treatments  Review of Systems   Review of Systems  All other systems reviewed and are negative.   Physical Exam Updated Vital Signs BP (!) 165/116   Pulse 92   Temp (!) 96.5 F (35.8 C) (Rectal)   Resp 13   SpO2 100%   Physical Exam Vitals and nursing note reviewed.  Constitutional:      General: He is not in acute distress.    Appearance: He is well-developed, normal weight and well-nourished.  HENT:     Head: Normocephalic and atraumatic.     Mouth/Throat:     Mouth: Oropharynx is clear and moist.  Eyes:     Extraocular Movements: EOM normal.     Conjunctiva/sclera: Conjunctivae normal.     Pupils: Pupils are equal, round, and reactive to light.  Cardiovascular:     Rate and Rhythm: Normal rate and regular rhythm.     Pulses: Intact distal pulses.  Heart sounds: No murmur heard.   Pulmonary:     Effort: Pulmonary effort is normal. No respiratory distress.     Breath sounds: Normal breath sounds. No wheezing or rales.  Abdominal:     General: There is no distension.     Palpations: Abdomen is soft.     Tenderness: There is no abdominal tenderness. There is no guarding or rebound.  Musculoskeletal:        General: No tenderness. Normal range of motion.     Cervical back: Normal range of motion and neck supple.     Right lower leg: Edema present.     Left lower leg: Edema present.     Comments: 1+ edema at the ankles bilaterally.  Full range of motion passively with both legs without reproducible pain  Skin:    General: Skin is warm and dry.     Findings: No erythema or rash.  Neurological:     Mental Status: He is alert and oriented to person, place, and time.     Comments: Baseline resting tremor in both upper extremities.  Mild pronator drift noted in the left upper extremity and 4 out of 5 strength in the left upper extremity.  The left lower extremity with 4 out of 5 strength.  Compared to the right lower extremity it appears  weaker.  Right lower extremity with 5 out of 5 strength and no evidence of pronator drift.  Sensation is intact.  No facial weakness noted.  No notable aphasia.  Psychiatric:        Mood and Affect: Mood and affect and mood normal.        Behavior: Behavior normal.        Thought Content: Thought content normal.     ED Results / Procedures / Treatments   Labs (all labs ordered are listed, but only abnormal results are displayed) Labs Reviewed  CBC WITH DIFFERENTIAL/PLATELET - Abnormal; Notable for the following components:      Result Value   MCHC 29.9 (*)    All other components within normal limits  COMPREHENSIVE METABOLIC PANEL - Abnormal; Notable for the following components:   Glucose, Bld 104 (*)    BUN 52 (*)    Creatinine, Ser 2.63 (*)    AST 12 (*)    GFR, Estimated 25 (*)    All other components within normal limits  RESP PANEL BY RT-PCR (FLU A&B, COVID) [id number]  URINALYSIS, ROUTINE W REFLEX MICROSCOPIC    EKG EKG Interpretation  Date/Time:  Tuesday March 01 2020 13:08:37 EST Ventricular Rate:  89 PR Interval:  158 QRS Duration: 76 QT Interval:  358 QTC Calculation: 435 R Axis:   -15 Text Interpretation: Normal sinus rhythm Normal ECG No significant change since last tracing Confirmed by Deno Etienne [phone]) on 03/01/2020 6:00:03 PM   Radiology DG Chest 2 View  Result Date: 03/01/2020 CLINICAL DATA:  Hypotension. Additional history provided: Patient reports left leg pain and hypotension today. EXAM: CHEST - 2 VIEW COMPARISON:  Prior chest radiograph 09/25/2019. FINDINGS: Heart size within normal limits. Ill-defined opacity within the left lung base. The right lung is clear. No evidence of pleural effusion or pneumothorax. No acute bony abnormality is identified. IMPRESSION: Ill-defined opacity within the left lung base, which may reflect pneumonia or atelectasis. Radiographic follow-up to resolution is recommended. Electronically Signed   By: Kellie Simmering DO   On:  03/01/2020 13:38   CT Head Wo Contrast  Result Date: 03/01/2020 CLINICAL DATA:  Drooling for several days, Parkinson's disease, left leg pain EXAM: CT HEAD WITHOUT CONTRAST TECHNIQUE: Contiguous axial images were obtained from the base of the skull through the vertex without intravenous contrast. COMPARISON:  09/25/2019 FINDINGS: Brain: No acute infarct or hemorrhage. Lateral ventricles and midline structures are unremarkable. No acute extra-axial fluid collections. No mass effect. Vascular: No hyperdense vessel or unexpected calcification. Skull: Normal. Negative for fracture or focal lesion. Sinuses/Orbits: No acute finding. Other: None. IMPRESSION: 1. Stable head CT, no acute process. Electronically Signed   By: Randa Ngo M.D.   On: 03/01/2020 20:17    Procedures Procedures (including critical care time)  Medications Ordered in ED Medications  lactated ringers bolus 500 mL (has no administration in time range)    ED Course  I have reviewed the triage vital signs and the nursing notes.  Pertinent labs & imaging results that were available during my care of the patient were reviewed by me and considered in my medical decision making (see chart for details).    MDM Rules/Calculators/A&P                          Patient is a 75 year old male with multiple medical problems presenting today with weakness in the left arm and leg.  On exam patient does have notable weakness to the left arm and leg compared to the right.  Patient is not in any sign of respiratory distress and denies any infectious symptoms however he does feel slightly warm on exam.  He has no abdominal pain or chest pain.  Labs with normal CBC but CMP with AKI today with creatinine of 2.6 from 1.5 in July.  Chest x-ray with an ill-defined opacity within the left lung base which right may represent pneumonia versus atelectasis.  Patient is satting 100% on room air and denies feeling short of breath however concern for possible  aspiration given his significant Parkinson's disease.  Also concern for stroke given his left-sided weakness.  Head CT was negative for acute issue but will discuss with neurology.  We'll also check rectal temp and urine.  Patient given IV fluids.  9:23 PM  Patient's temperature is 96.5.  He denies any urinary symptoms.  Speaking with his caregiver she endorses the same that he has had significant weakness on the left side today but was ambulating normally last night.  Spoke with neurology who will consult on the patient.  Recommended MRI of the brain and cervical spine due to his prior issues with cervical disc protrusion and he is scheduled to have an MRI in the future because he has been having more neck pain.  Patient was given IV fluids.  Will discuss possible treatment for potential aspiration pneumonia.  Will admit to the hospitalist for further care.  MDM Number of Diagnoses or Management Options   Amount and/or Complexity of Data Reviewed Clinical lab tests: ordered and reviewed Tests in the radiology section of CPT: ordered and reviewed Tests in the medicine section of CPT: ordered and reviewed Decide to obtain previous medical records or to obtain history from someone other than the patient: yes Obtain history from someone other than the patient: yes Review and summarize past medical records: yes Discuss the patient with other providers: yes Independent visualization of images, tracings, or specimens: yes  Risk of Complications, Morbidity, and/or Mortality Presenting problems: moderate Diagnostic procedures: low Management options: low  Patient Progress Patient progress: stable    Final Clinical Impression(s) /  ED Diagnoses Final diagnoses:  Left-sided weakness  AKI (acute kidney injury) [facility])    Rx / DC Orders ED Discharge Orders    None       Gwyneth Sprout, MD 03/01/20 2139

## 2020-03-02 ENCOUNTER — Observation Stay (HOSPITAL_BASED_OUTPATIENT_CLINIC_OR_DEPARTMENT_OTHER): Payer: 59

## 2020-03-02 ENCOUNTER — Other Ambulatory Visit: Payer: Self-pay

## 2020-03-02 DIAGNOSIS — R29818 Other symptoms and signs involving the nervous system: Principal | ICD-10-CM

## 2020-03-02 DIAGNOSIS — I10 Essential (primary) hypertension: Secondary | ICD-10-CM

## 2020-03-02 DIAGNOSIS — R600 Localized edema: Secondary | ICD-10-CM

## 2020-03-02 DIAGNOSIS — R531 Weakness: Secondary | ICD-10-CM | POA: Diagnosis not present

## 2020-03-02 LAB — LIPID PANEL
Cholesterol: 135 mg/dL (ref 0–200)
HDL: 29 mg/dL — ABNORMAL LOW (ref >40)
LDL Cholesterol: 90 mg/dL (ref 0–99)
Total CHOL/HDL Ratio: 4.7 RATIO
Triglycerides: 80 mg/dL (ref <150)
VLDL: 16 mg/dL (ref 0–40)

## 2020-03-02 LAB — GLUCOSE, CAPILLARY
Glucose-Capillary: 87 mg/dL (ref 70–99)
Glucose-Capillary: 158 mg/dL — ABNORMAL HIGH (ref 70–99)

## 2020-03-02 LAB — HEMOGLOBIN A1C
Hgb A1c MFr Bld: 6.1 % — ABNORMAL HIGH (ref 4.8–5.6)
Mean Plasma Glucose: 128.37 mg/dL

## 2020-03-02 LAB — CBG MONITORING, ED
Glucose-Capillary: 82 mg/dL (ref 70–99)
Glucose-Capillary: 104 mg/dL — ABNORMAL HIGH (ref 70–99)

## 2020-03-02 MED ORDER — PERFLUTREN LIPID MICROSPHERE
1.0000 mL | INTRAVENOUS | Status: AC | PRN
  Administered 2020-03-02: 2 mL via INTRAVENOUS
  Filled 2020-03-02: qty 10

## 2020-03-02 MED ORDER — LACTATED RINGERS IV SOLN: INTRAVENOUS | Status: DC

## 2020-03-02 MED ORDER — CARBIDOPA-LEVODOPA 25-100 MG PO TABS
2.0000 | ORAL_TABLET | Freq: Four times a day (QID) | ORAL | Status: DC
  Administered 2020-03-02 – 2020-03-03 (×7): 2 via ORAL
  Filled 2020-03-02 (×9): qty 2

## 2020-03-02 NOTE — ED Notes (Signed)
Pt transferred to a hospital bed

## 2020-03-02 NOTE — Progress Notes (Signed)
Physical Therapy Evaluation Patient Details Name: Tyler Lester MRN: [id number] DOB: Sep 16, 1945 Today's Date: 03/02/2020   History of Present Illness  75 y.o. male with PMH significant for  has a past medical history of Bilateral foot pain, Chronic pain, Diabetes mellitus, Foley catheter in place (since nov 2018, last changed 1 week ago), Hyperlipidemia, Hypertension, Inguinal hernia, Numbness, Parkinson disease (08/26/2015), Pneumonia (yrs ago), Shortness of breath, and Urinary retention. who presented with worsening gait and weakness of Left side.MRI (-) for acute CVA.  Clinical Impression  Pt admitted secondary to problem above with deficits below. Only agreeable to bed mobility for repositioning. Requiring mod to max A for assist with rolling from side to side. Pt reports he has 24/7 care from caregiver and is normally able to walk with RW. Unsure of accuracy. Feel pt will likely benefit from SNF level therapies at d/c. Will continue to follow acutely.     Follow Up Recommendations SNF;Supervision/Assistance - 24 hour    Equipment Recommendations  Wheelchair cushion (measurements PT);Wheelchair (measurements PT)    Recommendations for Other Services       Precautions / Restrictions Precautions Precautions: Fall Restrictions Weight Bearing Restrictions: No      Mobility  Bed Mobility Overal bed mobility: Needs Assistance Bed Mobility: Rolling Rolling: Mod assist;Max assist         General bed mobility comments: Requiring mod to max A to roll from side to side for repositioning. Pt wanting to be positioned on his back. Refusing to attempt further mobility.    Transfers                 General transfer comment: unable to attempt  Ambulation/Gait                Stairs            Wheelchair Mobility    Modified Rankin (Stroke Patients Only)       Balance Overall balance assessment: Needs assistance                                            Pertinent Vitals/Pain Pain Assessment: Faces Faces Pain Scale: Hurts a little bit Pain Location: B shoulders Pain Descriptors / Indicators: Discomfort Pain Intervention(s): Limited activity within patient's tolerance;Monitored during session;Repositioned    Home Living Family/patient expects to be discharged to:: Private residence Living Arrangements: Non-relatives/Friends Available Help at Discharge: Personal care attendant;Available 24 hours/day Type of Home: House Home Access: Ramped entrance     Home Layout: One level Home Equipment: Walker - 2 wheels;Cane - single point      Prior Function Level of Independence: Needs assistance   Gait / Transfers Assistance Needed: Uses RW for ambulation.  ADL's / Homemaking Assistance Needed: States his caregiver assists with ADL tasks; states he is usually able to walk to the bathroom; able to feed self at baseline        Hand Dominance   Dominant Hand: Right    Extremity/Trunk Assessment   Upper Extremity Assessment Upper Extremity Assessment: Defer to OT evaluation RUE Deficits / Details: limited rigid movemetn patterns ; able to achieve @ 40 degrees active shoulder flex; elbow ROM grossly WFL. Unable to make full gross grasp/release; fine motor skills poor - unable to hold utensil LUE Deficits / Details: similar to R    Lower Extremity Assessment Lower Extremity Assessment: Generalized weakness (able  to perform heel slide and SLR bilaterally. Increased difficulty with SLR)    Cervical / Trunk Assessment Cervical / Trunk Assessment: Kyphotic  Communication   Communication: No difficulties  Cognition Arousal/Alertness: Awake/alert Behavior During Therapy: Flat affect Overall Cognitive Status: No family/caregiver present to determine baseline cognitive functioning                                 General Comments: Most likely close to baseline. Slow processing      General Comments       Exercises     Assessment/Plan    PT Assessment Patient needs continued PT services  PT Problem List Decreased strength;Decreased mobility;Decreased balance;Decreased activity tolerance;Decreased cognition;Decreased knowledge of use of DME;Decreased safety awareness;Decreased knowledge of precautions       PT Treatment Interventions DME instruction;Gait training;Functional mobility training;Therapeutic activities;Therapeutic exercise;Balance training;Neuromuscular re-education;Patient/family education;Cognitive remediation    PT Goals (Current goals can be found in the Care Plan section)  Acute Rehab PT Goals Patient Stated Goal: none stated PT Goal Formulation: With patient Time For Goal Achievement: 03/16/20 Potential to Achieve Goals: Good    Frequency Min 2X/week   Barriers to discharge Other (comment) Unsure of caregiver support    Co-evaluation               AM-PAC PT "6 Clicks" Mobility  Outcome Measure Help needed turning from your back to your side while in a flat bed without using bedrails?: A Lot Help needed moving from lying on your back to sitting on the side of a flat bed without using bedrails?: A Lot Help needed moving to and from a bed to a chair (including a wheelchair)?: A Lot Help needed standing up from a chair using your arms (e.g., wheelchair or bedside chair)?: A Lot Help needed to walk in hospital room?: A Lot Help needed climbing 3-5 steps with a railing? : Total 6 Click Score: 11    End of Session   Activity Tolerance: Patient tolerated treatment well Patient left: in bed;with call bell/phone within reach (on bed in ED) Nurse Communication: Mobility status PT Visit Diagnosis: Unsteadiness on feet (R26.81);Muscle weakness (generalized) (M62.81)    Time: 0938-1829 PT Time Calculation (min) (ACUTE ONLY): 15 min   Charges:   PT Evaluation $PT Eval Moderate Complexity: 1 Mod          Farley Ly, PT, DPT  Acute Rehabilitation  Services  Pager: [phone] Office: [phone]   Lehman Prom 03/02/2020, 4:27 PM

## 2020-03-02 NOTE — ED Notes (Signed)
Pt in incontinent of urine. Patients brief removed and cleaned. Replaced sheets. Condom cath applied to pt.

## 2020-03-02 NOTE — Progress Notes (Signed)
  Echocardiogram 2D Echocardiogram has been performed.  Tyler Lester 03/02/2020, 2:12 PM

## 2020-03-02 NOTE — Progress Notes (Signed)
Occupational Therapy Evaluation Patient Details Name: Tyler Lester MRN: [id number] DOB: 16-May-1945 Today's Date: 03/02/2020    History of Present Illness 75 y.o. male with PMH significant for  has a past medical history of Bilateral foot pain, Chronic pain, Diabetes mellitus, Foley catheter in place (since nov 2018, last changed 1 week ago), Hyperlipidemia, Hypertension, Inguinal hernia, Numbness, Parkinson disease (08/26/2015), Pneumonia (yrs ago), Shortness of breath, and Urinary retention. who presented with worsening gait and weakness of Left side.MRI (-) for acute CVA.   Clinical Impression   Pt states he lives at home and has a 24/7 caregiver and at baseline he is ablet o ambulate. Caregiver assists hiim with ADL as needed. Pt with significant limitations with mobility and requires Max A to roll in bed and is unable to progress to EOB with assist of 1. Pt unable to use BUE functionally at this time due to "needing to get the medicine out of his system". A this time recommend rehab at [facility]. Will follow acutely and update POC as needed.     Follow Up Recommendations  SNF;Supervision/Assistance - 24 hour    Equipment Recommendations  Other (comment);3 in 1 bedside commode;Tub/shower seat    Recommendations for Other Services       Precautions / Restrictions Precautions Precautions: Fall      Mobility Bed Mobility Overal bed mobility: Needs Assistance Bed Mobility: Rolling Rolling: Max assist         General bed mobility comments: attempted bed mobility with Max A    Transfers                 General transfer comment: unable to attempt    Balance Overall balance assessment: Needs assistance                                         ADL either performed or assessed with clinical judgement   ADL                                         General ADL Comments: total A at this time     Vision   Additional Comments: will further  assess; pt reports no change from baseline     Perception     Praxis      Pertinent Vitals/Pain Pain Assessment: Faces Faces Pain Scale: Hurts a little bit Pain Location: B shoulders Pain Descriptors / Indicators: Discomfort Pain Intervention(s): Limited activity within patient's tolerance     Hand Dominance Right   Extremity/Trunk Assessment Upper Extremity Assessment Upper Extremity Assessment: RUE deficits/detail;LUE deficits/detail RUE Deficits / Details: limited rigid movemetn patterns ; able to achieve @ 40 degrees active shoulder flex; elbow ROM grossly WFL. Unable to make full gross grasp/release; fine motor skills poor - unable to hold utensil LUE Deficits / Details: similar to R   Lower Extremity Assessment Lower Extremity Assessment: Defer to PT evaluation   Cervical / Trunk Assessment Cervical / Trunk Assessment: Kyphotic   Communication     Cognition Arousal/Alertness: Awake/alert Behavior During Therapy: Flat affect Overall Cognitive Status: No family/caregiver present to determine baseline cognitive functioning  General Comments: Kept eyes closed 50% of session; alert and oriented x4. most likely close to baseline; will further assess   General Comments       Exercises     Shoulder Instructions      Home Living Family/patient expects to be discharged to:: Private residence Living Arrangements: Non-relatives/Friends Available Help at Discharge: Personal care attendant;Available 24 hours/day Type of Home: House Home Access: Ramped entrance     Home Layout: One level     Bathroom Shower/Tub: Teacher, early years/pre: Handicapped height     Home Equipment: Environmental consultant - 2 wheels;Cane - single point          Prior Functioning/Environment Level of Independence: Needs assistance  Gait / Transfers Assistance Needed: Uses RW for ambulation. ADL's / Homemaking Assistance Needed: States his  caregiver assists with ADL tasks; states he is usually able to walk to the bathroom; able to feed self at baseline            OT Problem List: Decreased strength;Decreased range of motion;Decreased activity tolerance;Impaired balance (sitting and/or standing);Decreased coordination;Decreased cognition;Decreased safety awareness;Decreased knowledge of use of DME or AE;Impaired tone;Impaired UE functional use      OT Treatment/Interventions: Self-care/ADL training;Therapeutic exercise;Neuromuscular education;Energy conservation;DME and/or AE instruction;Therapeutic activities;Cognitive remediation/compensation;Patient/family education;Balance training    OT Goals(Current goals can be found in the care plan section) Acute Rehab OT Goals Patient Stated Goal: to get the medicine out of him OT Goal Formulation: With patient Time For Goal Achievement: 03/16/20 Potential to Achieve Goals: Fair  OT Frequency: Min 2X/week   Barriers to D/C:            Co-evaluation              AM-PAC OT "6 Clicks" Daily Activity     Outcome Measure Help from another person eating meals?: Total Help from another person taking care of personal grooming?: Total Help from another person toileting, which includes using toliet, bedpan, or urinal?: Total Help from another person bathing (including washing, rinsing, drying)?: Total Help from another person to put on and taking off regular upper body clothing?: Total Help from another person to put on and taking off regular lower body clothing?: Total 6 Click Score: 6   End of Session Nurse Communication: Mobility status  Activity Tolerance: Patient limited by fatigue;Other (comment) (" need to get this medication out of me") Patient left: in bed;with call bell/phone within reach  OT Visit Diagnosis: Unsteadiness on feet (R26.81);Other abnormalities of gait and mobility (R26.89);Muscle weakness (generalized) (M62.81);Other symptoms and signs involving  cognitive function                Time: 1240-1254 OT Time Calculation (min): 14 min Charges:  OT General Charges $OT Visit: 1 Visit OT Evaluation $OT Eval Moderate Complexity: [place], OT/L   Acute OT Clinical Specialist Acute Rehabilitation Services Pager [phone] Office [phone]   [facility] 03/02/2020, 2:59 PM

## 2020-03-02 NOTE — Consult Note (Signed)
Neurology Consultation Reason for Consult: Worsening gait Referring Physician: Anitra Lauth, W  CC: Worsening gait  History is obtained from: Patient  HPI: Tyler Lester is a 75 y.o. male with a history of Parkinson's disease as well as relatively poor compliance with medication regimens as well as history of being dismissed from [facility] neurology due to no-shows.  He states that his parkinsonism appears to be getting worse and he was able to walk a few days ago, but then he got to the point of not being able to walk "due to his tremors."  His left side is definitely worse than his right and it is the side that he attributes his difficulty walking to.  He states that he takes one tab 2-3 times a day.  Of note on reviewing previous notes, he has repeatedly been thought to be undertreated with advisement to increase his regimen.  It is unclear if his abnormal movements were treatment related dyskinesias versus off dose phenomenon, and today as well he is unable to give a clear temporal correlation between dosing and symptoms.  Due to the relatively rapid worsening, an MRI was obtained which does not demonstrate any acute stroke.    ROS: A 14 point ROS was performed and is negative except as noted in the HPI.  Past Medical History:  Diagnosis Date  . Bilateral foot pain   . Chronic pain   . Diabetes mellitus    type 2, diet controlled now  . Foley catheter in place since nov 2018, last changed 1 week ago  . Hyperlipidemia   . Hypertension   . Inguinal hernia    right  . Numbness    T/O  . Parkinson disease (HCC) 08/26/2015   Followed by [facility]. Last visit 06/03/2015. History of poor compliance with his medication. Referred him back to [facility] in 10/2016. They didn't want to see him back due to poor compliance with medical advise. Last plan from 06/03/2015 still valid  . Parkinson's disease (HCC)   . Pneumonia yrs ago  . Shortness of breath    with exertion  . Urinary retention      Family  History  Problem Relation Age of Onset  . Cancer - Other Other   . Diabetes Other   . Hypertension Other      Social History:  reports that he has never smoked. He has never used smokeless tobacco. He reports current alcohol use. He reports that he does not use drugs.   Exam: Current vital signs: BP 117/65 (BP Location: Right Arm)   Pulse 99   Temp (!) 96.5 F (35.8 C) (Rectal)   Resp 20   SpO2 100%  Vital signs in last 24 hours: Temp:  [96.5 F (35.8 C)-98.1 F (36.7 C)] 96.5 F (35.8 C) (01/04 1939) Pulse Rate:  [80-103] 99 (01/05 0532) Resp:  [11-23] 20 (01/05 0532) BP: (80-181)/(59-158) 117/65 (01/05 0532) SpO2:  [96 %-100 %] 100 % (01/05 0532)   Physical Exam  Constitutional: Appears well-developed and well-nourished.  Psych: Affect appropriate to situation Eyes: No scleral injection HENT: No OP obstrucion MSK: no joint deformities.  Cardiovascular: Normal rate and regular rhythm.  Respiratory: Effort normal, non-labored breathing GI: Soft.  No distension. There is no tenderness.  Skin: WDI  Neuro: Mental Status: Patient is awake, alert, oriented to person, place, month, year, and situation. Patient is able to give a clear and coherent history. No signs of aphasia or neglect Cranial Nerves: II: Visual Fields are full. Pupils  are equal, round, and reactive to light.   III,IV, VI: EOMI without ptosis or diploplia.  V: Facial sensation is symmetric to temperature VII: Facial movement is symmetric.  VIII: hearing is intact to voice X: Uvula elevates symmetrically XI: Shoulder shrug is symmetric. XII: tongue is midline without atrophy or fasciculations.  Motor: Tone is mildly increased, he has significant tremor bilaterally left > right Sensory: Sensation is symmetric to light touch and temperature in the arms and legs. Deep Tendon Reflexes: 2+ and symmetric in the biceps and patellae.  Plantars: Toes are downgoing bilaterally.  Cerebellar: Significant  resting tremor that attenuates but does not completely abate with movement.    I have reviewed labs in epic and the results pertinent to this consultation are: Creatinine 2.6  I have reviewed the images obtained: CT head - negative.   Impression: 75 year old male with progressive worsening of his parkinsonian features.  I do wonder if his AKI is playing a role as metabolic derangements can worsen underlying neurological symptoms.  I suspect that he is undertreated, the possibility that his dyskinesias are peak dose dyskinesias is a possibility as well, the fact that he is off at the current time I am examining him would argue otherwise.  His imaging reveals no evidence of stroke, and though he does have some spinal stenosis he does not have any clear findings of myelopathy on exam.  Recommendations: 1) increase Sinemet to two tabs of 25/100 4 times daily, give at eight, noon, four, eight 2) treat underlying medical issues such as AKI 3) neurology will continue to follow  Roland Rack, MD [facility] [phone]  If 7pm- 7am, please page neurology on call as listed in [place].

## 2020-03-02 NOTE — Progress Notes (Signed)
PROGRESS NOTE  Tyler VERNET [id number] DOB: 20-Sep-1945 DOA: 03/01/2020 PCP: Tyler Oliphant, MD  HPI/Recap of past 24 hours:  Tyler Lester is a 75 y.o. male with medical history significant of Parkinson's disease, back pain, BPH, CHF, CKD 3, depression, diabetes, hypertension, GERD, hyperlipidemia, gout who presents with 1 day of left-sided weakness.             History obtained with assistance of caregiver.  Patient seemed to be at his baseline yesterday evening he was able to walk with his walker.  Today caregiver came to see him and he was unable to even stand up and he needed significant help and was struggling even with significant help.  He does report that the did seem to start last night but was not significant at that time.  He is also had some increased drooling on his right side.  Denies numbness, vision changes, hearing changes.  Delay in 5 movements but is able to he denies fever, chills, cough, shortness of breath, chest pain, abdominal pain, nausea, vomiting, constipation, diarrhea              ED Course: Vital signs in the ED significant for blood pressure in the 123456 to [id number] systolic, heart rate in the 80s to 90s, saturating well on room air.  Lab work-up showed BMP with creatinine of 2.6 elevated from baseline of 1.6.  LFTs within normal limits.  CBC within normal limits.  Respiratory panel for flu and Covid pending urinalysis pending.  Chest x-ray showed atelectasis versus pneumonia in the left lower lobe.  CT head was normal.  Neurology was consulted and recommend MRI of brain as well as C-spine as this has been scheduled outpatient has a history of C-spine surgery.  03/02/20: Patient was seen and examined at his bedside in the ED.  Significant tremors noted at rest.  Parkinson disease medications are currently being adjusted by neurology.  Assessment/Plan: Principal Problem:   Acute focal neurological deficit Active Problems:   Acute renal failure superimposed on stage 3  chronic kidney disease (HCC)   CKD (chronic kidney disease) stage 3, GFR 30-59 ml/min (HCC)   Postlaminectomy syndrome, cervical region   Parkinson's disease ([place])   Type 2 diabetes mellitus with complication (HCC)   Hyperlipidemia  Acute focal neurologic deficit likely secondary to progressive worsening of his parkinsonian features Acute CVA ruled out by MRI brain. He has some spinal stenosis with no clear findings of myelopathy on exam per neurology. Presented with left-sided lower extremity weakness and right mild facial droop with increased drooling. Seen by neurology, MRI brain negative for acute stroke. Recommended to increase Sinemet dose Appreciate neurology's recommendations Continue to closely monitor.  AKI on CKD 3 > Creatinine elevated to 2.6 from a baseline of 1.6 Received IV fluid in the ED Start gentle IV fluid LR 30 cc/h in the ED x1 day. Monitor urine output Global nephrotoxins, dehydration and hypotension Repeat renal panel in the morning.  LE edema > CHF in chart but last echo with EF of 123456 and no diastolic dysfunction - Hold Lasix and potassium in setting of AKI Closely monitor volume status while on gentle IV fluid hydration.  Parkinson's disease > Has not seen a neurologist for a while per the report as they were dismissed due to missing visits and have been unable to get new referral while changing PCP. Management per neurology.  Chest x-ray abnormality > Infiltrate versus atelectasis - Favor atelectasis at this time as  there is no fever, no leukocytosis and no systemic symptoms  Diabetes -Hemoglobin A1c 6.1 on 03/02/2020 -Currently on insulin sliding scale -Avoid hypoglycemia.  Hyperlipidemia LDL 90 on 03/02/2020 Continue atorvastatin  Gout Continue home allopurinol    Code Status: Full code  Family Communication: None at bedside.  Disposition Plan: Likely home on 03/03/2020 1 symptomatology has improved and neurology signs  off.   Consultants:  Neurology  Procedures:  None.  Antimicrobials:  None.  DVT prophylaxis: Start Lovenox daily  Status is: Observation    Dispo: The patient is from: Home.              Anticipated d/c is to: Home.              Anticipated d/c date is: 03/03/2020              Patient currently not ready for discharge with ongoing adjustment of Parkinson's disease medications.        Objective: Vitals:   03/02/20 0726 03/02/20 0728 03/02/20 0915 03/02/20 0934  BP:    110/61  Pulse:   98 97  Resp:   12 16  Temp: 98.1 F (36.7 C) (!) 97.5 F (36.4 C)    TempSrc: Oral Oral    SpO2:   98% 98%    Intake/Output Summary (Last 24 hours) at 03/02/2020 1105 Last data filed at 03/02/2020 [id number] Gross per 24 hour  Intake 630 ml  Output --  Net 630 ml   There were no vitals filed for this visit.  Exam:  . General: 75 y.o. year-old male well developed well nourished in no acute distress.  Alert and interactive.  Bilateral upper extremities tremors noted at rest. . Cardiovascular: Regular rate and rhythm with no rubs or gallops.  No thyromegaly or JVD noted.   Marland Kitchen Respiratory: Clear to auscultation with no wheezes or rales. Good inspiratory effort. . Abdomen: Soft nontender nondistended with normal bowel sounds. . Musculoskeletal: Trace lower extremity edema bilaterally.  Marland Kitchen Psychiatry: Mood is appropriate for condition and setting   Data Reviewed: CBC: Recent Labs  Lab 03/01/20 1321  WBC 6.5  NEUTROABS 4.3  HGB 14.9  HCT 49.9  MCV 89.6  PLT 0000000   Basic Metabolic Panel: Recent Labs  Lab 03/01/20 1321  NA 143  K 4.6  CL 109  CO2 25  GLUCOSE 104*  BUN 52*  CREATININE 2.63*  CALCIUM 9.8   GFR: CrCl cannot be calculated (Unknown ideal weight.). Liver Function Tests: Recent Labs  Lab 03/01/20 1321  AST 12*  ALT <5  ALKPHOS 40  BILITOT 1.0  PROT 7.7  ALBUMIN 3.7   No results for input(s): LIPASE, AMYLASE in the last 168 hours. No results for  input(s): AMMONIA in the last 168 hours. Coagulation Profile: No results for input(s): INR, PROTIME in the last 168 hours. Cardiac Enzymes: No results for input(s): CKTOTAL, CKMB, CKMBINDEX, TROPONINI in the last 168 hours. BNP (last 3 results) No results for input(s): PROBNP in the last 8760 hours. HbA1C: Recent Labs    03/02/20 0500  HGBA1C 6.1*   CBG: Recent Labs  Lab 03/02/20 0730  GLUCAP 82   Lipid Profile: Recent Labs    03/02/20 0500  CHOL 135  HDL 29*  LDLCALC 90  TRIG 80  CHOLHDL 4.7   Thyroid Function Tests: No results for input(s): TSH, T4TOTAL, FREET4, T3FREE, THYROIDAB in the last 72 hours. Anemia Panel: No results for input(s): VITAMINB12, FOLATE, FERRITIN, TIBC, IRON, RETICCTPCT in the  last 72 hours. Urine analysis:    Component Value Date/Time   COLORURINE YELLOW 09/25/2019 1944   APPEARANCEUR CLEAR 09/25/2019 1944   LABSPEC 1.014 09/25/2019 1944   PHURINE 6.0 09/25/2019 1944   GLUCOSEU NEGATIVE 09/25/2019 1944   HGBUR NEGATIVE 09/25/2019 1944   BILIRUBINUR NEGATIVE 09/25/2019 1944   BILIRUBINUR NEGATIVE 05/27/2014 1020   KETONESUR NEGATIVE 09/25/2019 1944   PROTEINUR NEGATIVE 09/25/2019 1944   UROBILINOGEN 1.0 10/29/2014 0042   NITRITE NEGATIVE 09/25/2019 1944   LEUKOCYTESUR NEGATIVE 09/25/2019 1944   Sepsis Labs: @LABRCNTIP (procalcitonin:4,lacticidven:4)  ) Recent Results (from the past 240 hour(s))  Resp Panel by RT-PCR (Flu A&B, Covid) Nasopharyngeal Swab     Status: None   Collection Time: 03/01/20  8:40 PM   Specimen: Nasopharyngeal Swab; Nasopharyngeal(NP) swabs in vial transport medium  Result Value Ref Range Status   SARS Coronavirus 2 by RT PCR NEGATIVE NEGATIVE Final    Comment: (NOTE) SARS-CoV-2 target nucleic acids are NOT DETECTED.  The SARS-CoV-2 RNA is generally detectable in upper respiratory specimens during the acute phase of infection. The lowest concentration of SARS-CoV-2 viral copies this assay can detect is 138  copies/mL. A negative result does not preclude SARS-Cov-2 infection and should not be used as the sole basis for treatment or other patient management decisions. A negative result may occur with  improper specimen collection/handling, submission of specimen other than nasopharyngeal swab, presence of viral mutation(s) within the areas targeted by this assay, and inadequate number of viral copies(<138 copies/mL). A negative result must be combined with clinical observations, patient history, and epidemiological information. The expected result is Negative.  Fact Sheet for Patients:  EntrepreneurPulse.com.au  Fact Sheet for Healthcare Providers:  IncredibleEmployment.be  This test is no t yet approved or cleared by the [place] FDA and  has been authorized for detection and/or diagnosis of SARS-CoV-2 by FDA under an Emergency Use Authorization (EUA). This EUA will remain  in effect (meaning this test can be used) for the duration of the COVID-19 declaration under Section 564(b)(1) of the Act, 21 U.S.C.section 360bbb-3(b)(1), unless the authorization is terminated  or revoked sooner.       Influenza A by PCR NEGATIVE NEGATIVE Final   Influenza B by PCR NEGATIVE NEGATIVE Final    Comment: (NOTE) The Xpert Xpress SARS-CoV-2/FLU/RSV plus assay is intended as an aid in the diagnosis of influenza from Nasopharyngeal swab specimens and should not be used as a sole basis for treatment. Nasal washings and aspirates are unacceptable for Xpert Xpress SARS-CoV-2/FLU/RSV testing.  Fact Sheet for Patients: EntrepreneurPulse.com.au  Fact Sheet for Healthcare Providers: IncredibleEmployment.be  This test is not yet approved or cleared by the [place] FDA and has been authorized for detection and/or diagnosis of SARS-CoV-2 by FDA under an Emergency Use Authorization (EUA). This EUA will remain in effect (meaning  this test can be used) for the duration of the COVID-19 declaration under Section 564(b)(1) of the Act, 21 U.S.C. section 360bbb-3(b)(1), unless the authorization is terminated or revoked.  Performed at [facility], [place] [address] [place], [place] [address]       Studies: DG Chest 2 View  Result Date: 03/01/2020 CLINICAL DATA:  Hypotension. Additional history provided: Patient reports left leg pain and hypotension today. EXAM: CHEST - 2 VIEW COMPARISON:  Prior chest radiograph 09/25/2019. FINDINGS: Heart size within normal limits. Ill-defined opacity within the left lung base. The right lung is clear. No evidence of pleural effusion or pneumothorax. No acute bony abnormality is  identified. IMPRESSION: Ill-defined opacity within the left lung base, which may reflect pneumonia or atelectasis. Radiographic follow-up to resolution is recommended. Electronically Signed   By: Kellie Simmering DO   On: 03/01/2020 13:38   CT Head Wo Contrast  Result Date: 03/01/2020 CLINICAL DATA:  Drooling for several days, Parkinson's disease, left leg pain EXAM: CT HEAD WITHOUT CONTRAST TECHNIQUE: Contiguous axial images were obtained from the base of the skull through the vertex without intravenous contrast. COMPARISON:  09/25/2019 FINDINGS: Brain: No acute infarct or hemorrhage. Lateral ventricles and midline structures are unremarkable. No acute extra-axial fluid collections. No mass effect. Vascular: No hyperdense vessel or unexpected calcification. Skull: Normal. Negative for fracture or focal lesion. Sinuses/Orbits: No acute finding. Other: None. IMPRESSION: 1. Stable head CT, no acute process. Electronically Signed   By: Randa Ngo M.D.   On: 03/01/2020 20:17   MR BRAIN WO CONTRAST  Result Date: 03/02/2020 CLINICAL DATA:  Initial evaluation for neuro deficit, stroke suspected, left-sided weakness. EXAM: MRI HEAD WITHOUT CONTRAST MRI CERVICAL SPINE WITHOUT CONTRAST TECHNIQUE: Multiplanar, multiecho pulse  sequences of the brain and surrounding structures, and cervical spine, to include the craniocervical junction and cervicothoracic junction, were obtained without intravenous contrast. COMPARISON:  Prior CT from 03/01/2020 as well as previous MRI from 11/12/2013. FINDINGS: MRI HEAD FINDINGS Brain: Mildly advanced age-related cerebral atrophy. No significant cerebral white matter disease. No abnormal foci of restricted diffusion to suggest acute or subacute ischemia. Gray-white matter differentiation maintained. No encephalomalacia to suggest chronic cortical infarction. No evidence for acute or chronic intracranial hemorrhage. No mass lesion, midline shift or mass effect. No hydrocephalus or extra-axial fluid collection. Pituitary gland mildly prominent with convex border superiorly without visible discrete lesion. Suprasellar region normal. Midline structures intact. Vascular: Major intracranial vascular flow voids are maintained. Skull and upper cervical spine: Craniocervical junction within normal limits. Bone marrow signal intensity normal. No scalp soft tissue abnormality. Sinuses/Orbits: Patient status post ocular lens replacement on the left. Globes and orbital soft tissues demonstrate no acute finding. Paranasal sinuses are largely clear. Small chronic appearing bilateral mastoid effusions noted, of doubtful significance. Inner ear structures grossly normal. Other: None. MRI CERVICAL SPINE FINDINGS Alignment: Straightening of the normal cervical lordosis. Trace 2 mm retrolisthesis of C5 on C6, likely chronic and degenerative. Vertebrae: Patient status post posterior decompression at C4 through C6, with posterior fusion extending from C3 through C6. Evaluation of the hardware limited by MRI. Vertebral body height maintained without acute or interval fracture. Bone marrow signal intensity overall within normal limits. No discrete or worrisome osseous lesions. Increased T2 signal intensity without associated  STIR signal intensity within the C5-6 interspace favored to be degenerative in nature. No paraspinous edema to suggest infection. Cord: Patchy cord signal abnormality seen involving the bilateral cord at the level of C5-6, consistent with chronic myelomalacia. Signal intensity within the cervical spinal cord otherwise within normal limits. Posterior Fossa, vertebral arteries, paraspinal tissues: Craniocervical junction within normal limits. Chronic postoperative scarring noted within the posterior paraspinous soft tissues. Paraspinous soft tissues demonstrate no acute finding. Normal flow voids seen within the vertebral arteries bilaterally. Disc levels: C2-C3: Shallow central disc osteophyte complex mildly indents the ventral thecal sac. Superimposed facet and ligament flavum hypertrophy. Resultant mild spinal stenosis with minimal cord flattening, but no cord signal changes. Foramina remain patent. C3-C4: Shallow central disc protrusion with annular fissure. Secondary mild flattening of the ventral thecal sac without significant spinal stenosis. Superimposed mild uncovertebral and facet hypertrophy with resultant mild bilateral C4  foraminal stenosis, slightly worse on the left. C4-C5: Lobulated and irregular central disc osteophyte complex flattens and effaces the ventral thecal sac. Patient is status post posterior decompression with fusion. Residual minimal flattening of the ventral cord related to disc osteophyte, but no significant spinal stenosis. Left greater than right uncovertebral and facet hypertrophy with resultant moderate left and mild right C5 foraminal stenosis. C5-C6: Degenerative intervertebral disc space narrowing with diffuse disc osteophyte complex. Broad posterior component indents and partially effaces the ventral thecal sac, asymmetric to the right. Prior posterior decompression. Persistent mild flattening of the ventral cord related to disc osteophyte without significant spinal stenosis.  Bilateral uncovertebral and facet hypertrophy with resultant moderate right worse than left C6 foraminal narrowing. C6-C7: Diffuse disc bulge with bilateral uncovertebral hypertrophy. Bulging disc flattens and effaces the ventral thecal sac. Cephalad and caudad migration of disc material noted. Superimposed bilateral facet hypertrophy. There is resultant moderate to severe spinal stenosis with mild cord flattening, but no appreciable cord signal changes. Thecal sac measures 5-6 mm in AP diameter. Overall, appearance is mildly progressed from previous. Left worse than right uncovertebral and facet hypertrophy with resultant moderate left worse than right C7 foraminal stenosis. C7-T1: Disc desiccation without significant disc bulge. Mild facet hypertrophy. No significant spinal stenosis. Borderline mild left C8 foraminal stenosis. Right neural foramina remains patent. Visualized upper thoracic spine demonstrates no significant finding. IMPRESSION: MRI HEAD IMPRESSION: 1. No acute intracranial abnormality. 2. Mild age-related cerebral atrophy. Otherwise unremarkable and normal brain MRI. MRI CERVICAL SPINE IMPRESSION: 1. No acute abnormality within the cervical spine. 2. Postoperative changes from prior posterior decompression and fusion at C3 through C6 without significant residual spinal stenosis. 3. 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 4. Patchy cord signal abnormality involving the bilateral cord at the level of C5-6, consistent with chronic myelomalacia. 5. Multifactorial degenerative changes with resultant multilevel foraminal narrowing as above. Notable findings include moderate left C5 foraminal stenosis, moderate right worse than left C6 foraminal narrowing, with moderate left worse than right C7 foraminal stenosis. Electronically Signed   By: Jeannine Boga M.D.   On: 03/02/2020 01:53   MR CERVICAL SPINE WO  CONTRAST  Result Date: 03/02/2020 CLINICAL DATA:  Initial evaluation for neuro deficit, stroke suspected, left-sided weakness. EXAM: MRI HEAD WITHOUT CONTRAST MRI CERVICAL SPINE WITHOUT CONTRAST TECHNIQUE: Multiplanar, multiecho pulse sequences of the brain and surrounding structures, and cervical spine, to include the craniocervical junction and cervicothoracic junction, were obtained without intravenous contrast. COMPARISON:  Prior CT from 03/01/2020 as well as previous MRI from 11/12/2013. FINDINGS: MRI HEAD FINDINGS Brain: Mildly advanced age-related cerebral atrophy. No significant cerebral white matter disease. No abnormal foci of restricted diffusion to suggest acute or subacute ischemia. Gray-white matter differentiation maintained. No encephalomalacia to suggest chronic cortical infarction. No evidence for acute or chronic intracranial hemorrhage. No mass lesion, midline shift or mass effect. No hydrocephalus or extra-axial fluid collection. Pituitary gland mildly prominent with convex border superiorly without visible discrete lesion. Suprasellar region normal. Midline structures intact. Vascular: Major intracranial vascular flow voids are maintained. Skull and upper cervical spine: Craniocervical junction within normal limits. Bone marrow signal intensity normal. No scalp soft tissue abnormality. Sinuses/Orbits: Patient status post ocular lens replacement on the left. Globes and orbital soft tissues demonstrate no acute finding. Paranasal sinuses are largely clear. Small chronic appearing bilateral mastoid effusions noted, of doubtful significance. Inner ear structures grossly normal. Other: None. MRI CERVICAL SPINE FINDINGS Alignment: Straightening  of the normal cervical lordosis. Trace 2 mm retrolisthesis of C5 on C6, likely chronic and degenerative. Vertebrae: Patient status post posterior decompression at C4 through C6, with posterior fusion extending from C3 through C6. Evaluation of the hardware  limited by MRI. Vertebral body height maintained without acute or interval fracture. Bone marrow signal intensity overall within normal limits. No discrete or worrisome osseous lesions. Increased T2 signal intensity without associated STIR signal intensity within the C5-6 interspace favored to be degenerative in nature. No paraspinous edema to suggest infection. Cord: Patchy cord signal abnormality seen involving the bilateral cord at the level of C5-6, consistent with chronic myelomalacia. Signal intensity within the cervical spinal cord otherwise within normal limits. Posterior Fossa, vertebral arteries, paraspinal tissues: Craniocervical junction within normal limits. Chronic postoperative scarring noted within the posterior paraspinous soft tissues. Paraspinous soft tissues demonstrate no acute finding. Normal flow voids seen within the vertebral arteries bilaterally. Disc levels: C2-C3: Shallow central disc osteophyte complex mildly indents the ventral thecal sac. Superimposed facet and ligament flavum hypertrophy. Resultant mild spinal stenosis with minimal cord flattening, but no cord signal changes. Foramina remain patent. C3-C4: Shallow central disc protrusion with annular fissure. Secondary mild flattening of the ventral thecal sac without significant spinal stenosis. Superimposed mild uncovertebral and facet hypertrophy with resultant mild bilateral C4 foraminal stenosis, slightly worse on the left. C4-C5: Lobulated and irregular central disc osteophyte complex flattens and effaces the ventral thecal sac. Patient is status post posterior decompression with fusion. Residual minimal flattening of the ventral cord related to disc osteophyte, but no significant spinal stenosis. Left greater than right uncovertebral and facet hypertrophy with resultant moderate left and mild right C5 foraminal stenosis. C5-C6: Degenerative intervertebral disc space narrowing with diffuse disc osteophyte complex. Broad  posterior component indents and partially effaces the ventral thecal sac, asymmetric to the right. Prior posterior decompression. Persistent mild flattening of the ventral cord related to disc osteophyte without significant spinal stenosis. Bilateral uncovertebral and facet hypertrophy with resultant moderate right worse than left C6 foraminal narrowing. C6-C7: Diffuse disc bulge with bilateral uncovertebral hypertrophy. Bulging disc flattens and effaces the ventral thecal sac. Cephalad and caudad migration of disc material noted. Superimposed bilateral facet hypertrophy. There is resultant moderate to severe spinal stenosis with mild cord flattening, but no appreciable cord signal changes. Thecal sac measures 5-6 mm in AP diameter. Overall, appearance is mildly progressed from previous. Left worse than right uncovertebral and facet hypertrophy with resultant moderate left worse than right C7 foraminal stenosis. C7-T1: Disc desiccation without significant disc bulge. Mild facet hypertrophy. No significant spinal stenosis. Borderline mild left C8 foraminal stenosis. Right neural foramina remains patent. Visualized upper thoracic spine demonstrates no significant finding. IMPRESSION: MRI HEAD IMPRESSION: 1. No acute intracranial abnormality. 2. Mild age-related cerebral atrophy. Otherwise unremarkable and normal brain MRI. MRI CERVICAL SPINE IMPRESSION: 1. No acute abnormality within the cervical spine. 2. Postoperative changes from prior posterior decompression and fusion at C3 through C6 without significant residual spinal stenosis. 3. 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 4. Patchy cord signal abnormality involving the bilateral cord at the level of C5-6, consistent with chronic myelomalacia. 5. Multifactorial degenerative changes with resultant multilevel foraminal narrowing as above. Notable findings include moderate left C5 foraminal  stenosis, moderate right worse than left C6 foraminal narrowing, with moderate left worse than right C7 foraminal stenosis. Electronically Signed   By: Rise Mu M.D.   On: 03/02/2020 01:53  Scheduled Meds: . allopurinol  300 mg Oral Daily  . aspirin EC  81 mg Oral Daily  . atorvastatin  20 mg Oral Daily  . carbidopa-levodopa  2 tablet Oral QID  . donepezil  10 mg Oral Daily  . enoxaparin (LOVENOX) injection  30 mg Subcutaneous Q24H  . gabapentin  300 mg Oral Daily  . lisinopril  20 mg Oral Daily   And  . hydrochlorothiazide  25 mg Oral Daily  . insulin aspart  0-9 Units Subcutaneous TID WC    Continuous Infusions:   LOS: 0 days     Kayleen Memos, MD [facility] Pager [phone]  If 7PM-7AM, please contact night-coverage [email or web address] Password [email or web address] 03/02/2020, 11:05 AM

## 2020-03-02 NOTE — Progress Notes (Cosign Needed)
NEUROLOGY CONSULTATION PROGRESS NOTE   Date of service: March 02, 2020 Patient Name: Tyler Lester MRN:  [id number] DOB:  [birth year]  Brief HPI  Tyler Lester is a 75 y.o. male with PMH significant for  has a past medical history of Bilateral foot pain, Chronic pain, Diabetes mellitus, Foley catheter in place (since nov 2018, last changed 1 week ago), Hyperlipidemia, Hypertension, Inguinal hernia, Numbness, Parkinson disease (HCC) (08/26/2015), Parkinson's disease (HCC), Pneumonia (yrs ago), Shortness of breath, and Urinary retention. who presented with worsening gait and weakness of Left side.   Interval Hx   Pt is making progress and his Left leg weakness has improved. H/O poor compliance with Parkinson's medication regimes. MRI Brain was negative for acute stroke. He was found to have AKI with creatinine of 2.6. He has been treated with IV fluids for that. We also increased his Sinemet to 2 tabs 25/100 4 times daily. MRI cervical spine showed no acute abnormality, revealed  segmental disease at C6-7 with moderate to severe spinal stenosis stenosis, patchy cord signal abnormality involving the bilateral cord at the level of C5-6, consistent with chronic myelomalacia.  Vitals   Vitals:   03/02/20 0726 03/02/20 0728 03/02/20 0915 03/02/20 0934  BP:    110/61  Pulse:   98 97  Resp:   12 16  Temp: 98.1 F (36.7 C) (!) 97.5 F (36.4 C)    TempSrc: Oral Oral    SpO2:   98% 98%     There is no height or weight on file to calculate BMI.  Physical Exam   General: Laying comfortably in bed; Not in no acute distress. Alter, oriented to date, month, year , place and person. Able to see simple calculations.  HENT: Normal oropharynx and mucosa. Normal external appearance of ears and nose.  Neck: Supple, no pain or tenderness, trachea central. CV: No JVD. No peripheral edema.  Pulmonary: Symmetric Chest rise. Normal respiratory effort. Abdomen: Soft, non-distended. Ext: No cyanosis, edema,  or deformity  Skin: No rash. Normal palpation of skin.  Musculoskeletal: Normal digits and nails by inspection. No clubbing.  Neurologic Examination  Mental status/Cognition: Alert, oriented to self, place, month and year, good attention.  Speech/language: Fluent, comprehension intact, object naming intact, able to do simple calculations. Cranial nerves:   CN II Pupils constricted b/l, equal and reactive to light, no VF deficits.   CN III,IV,VI EOM intact, no gaze preference or deviation, no nystagmus    CN V normal sensation in V1, V2, and V3 segments bilaterally   CN VII no asymmetry, no nasolabial fold flattening   CN VIII normal hearing to speech.   CN IX & X normal palatal elevation, no uvular deviation   CN XI 5/5 head turn and 5/5 shoulder shrug bilaterally   CN XII midline tongue protrusion   Motor:  Muscle tone- mildly increased,  Tremor- Present B/L UE  Lt>Rt Hand grip on Rt side little weaker as compared to Lt. Mild weakness in Left leg. Pt was able to lift both legs.  Reflexes:  Right Left Comments   Biceps (C5/6) 2+ 2+   Brachioradialis (C5/6) 2+ 2+    Triceps (C6/7) 2+ 2+    Patellar (L3/4) 2+ 2+    Achilles (S1) 2+ 2+    Plantar Downgoing Downgoing    Sensation:  Light touch Sensations B/L equal in LE's, Decreased RUE as compared to LUE   Pin prick    Temperature    Vibration   Proprioception  Coordination/Complex Motor:  Significant Resting tremors present  B/L. Left >Right. Doesn't stop with movements.  - Finger to Nose-No incoordination noted. - Heel to shin- Slight incoordination on Left side - Rapid alternating movement- no incoordination noted. - Gait: Unable to test.  Labs   Basic Metabolic Panel:  Lab Results  Component Value Date   NA 143 03/01/2020   K 4.6 03/01/2020   CO2 25 03/01/2020   GLUCOSE 104 (H) 03/01/2020   BUN 52 (H) 03/01/2020   CREATININE 2.63 (H) 03/01/2020   CALCIUM 9.8 03/01/2020   GFRNONAA 25 (L) 03/01/2020   GFRAA  44 (L) 09/25/2019   HbA1c:  Lab Results  Component Value Date   HGBA1C 6.1 (H) 03/02/2020   LDL:  Lab Results  Component Value Date   LDLCALC 90 03/02/2020   Urine Drug Screen:     Component Value Date/Time   LABOPIA NONE DETECTED 09/25/2019 1944   COCAINSCRNUR NONE DETECTED 09/25/2019 1944   COCAINSCRNUR NEG 07/23/2014 1347   LABBENZ NONE DETECTED 09/25/2019 1944   LABBENZ NEG 11/09/2013 1204   AMPHETMU NONE DETECTED 09/25/2019 1944   THCU NONE DETECTED 09/25/2019 1944   LABBARB NONE DETECTED 09/25/2019 1944    Alcohol Level No results found for: ETH No results found for: PHENYTOIN, ZONISAMIDE, LAMOTRIGINE, LEVETIRACETA No results found for: PHENYTOIN, PHENOBARB, VALPROATE, CBMZ  Imaging and Diagnostic studies  Results for orders placed during the hospital encounter of 03/01/20  MR BRAIN WO CONTRAST  Narrative CLINICAL DATA:  Initial evaluation for neuro deficit, stroke suspected, left-sided weakness.  EXAM: MRI HEAD WITHOUT CONTRAST  MRI CERVICAL SPINE WITHOUT CONTRAST  TECHNIQUE: Multiplanar, multiecho pulse sequences of the brain and surrounding structures, and cervical spine, to include the craniocervical junction and cervicothoracic junction, were obtained without intravenous contrast.  COMPARISON:  Prior CT from 03/01/2020 as well as previous MRI from 11/12/2013.  FINDINGS: MRI HEAD FINDINGS  Brain: Mildly advanced age-related cerebral atrophy. No significant cerebral white matter disease. No abnormal foci of restricted diffusion to suggest acute or subacute ischemia. Gray-white matter differentiation maintained. No encephalomalacia to suggest chronic cortical infarction. No evidence for acute or chronic intracranial hemorrhage.  No mass lesion, midline shift or mass effect. No hydrocephalus or extra-axial fluid collection. Pituitary gland mildly prominent with convex border superiorly without visible discrete lesion. Suprasellar region normal.  Midline structures intact.  Vascular: Major intracranial vascular flow voids are maintained.  Skull and upper cervical spine: Craniocervical junction within normal limits. Bone marrow signal intensity normal. No scalp soft tissue abnormality.  Sinuses/Orbits: Patient status post ocular lens replacement on the left. Globes and orbital soft tissues demonstrate no acute finding. Paranasal sinuses are largely clear. Small chronic appearing bilateral mastoid effusions noted, of doubtful significance. Inner ear structures grossly normal.  Other: None.  MRI CERVICAL SPINE FINDINGS  Alignment: Straightening of the normal cervical lordosis. Trace 2 mm retrolisthesis of C5 on C6, likely chronic and degenerative.  Vertebrae: Patient status post posterior decompression at C4 through C6, with posterior fusion extending from C3 through C6. Evaluation of the hardware limited by MRI. Vertebral body height maintained without acute or interval fracture. Bone marrow signal intensity overall within normal limits. No discrete or worrisome osseous lesions. Increased T2 signal intensity without associated STIR signal intensity within the C5-6 interspace favored to be degenerative in nature. No paraspinous edema to suggest infection.  Cord: Patchy cord signal abnormality seen involving the bilateral cord at the level of C5-6, consistent with chronic myelomalacia. Signal intensity within the cervical  spinal cord otherwise within normal limits.  Posterior Fossa, vertebral arteries, paraspinal tissues: Craniocervical junction within normal limits. Chronic postoperative scarring noted within the posterior paraspinous soft tissues. Paraspinous soft tissues demonstrate no acute finding. Normal flow voids seen within the vertebral arteries bilaterally.  Disc levels:  C2-C3: Shallow central disc osteophyte complex mildly indents the ventral thecal sac. Superimposed facet and ligament flavum hypertrophy.  Resultant mild spinal stenosis with minimal cord flattening, but no cord signal changes. Foramina remain patent.  C3-C4: Shallow central disc protrusion with annular fissure. Secondary mild flattening of the ventral thecal sac without significant spinal stenosis. Superimposed mild uncovertebral and facet hypertrophy with resultant mild bilateral C4 foraminal stenosis, slightly worse on the left.  C4-C5: Lobulated and irregular central disc osteophyte complex flattens and effaces the ventral thecal sac. Patient is status post posterior decompression with fusion. Residual minimal flattening of the ventral cord related to disc osteophyte, but no significant spinal stenosis. Left greater than right uncovertebral and facet hypertrophy with resultant moderate left and mild right C5 foraminal stenosis.  C5-C6: Degenerative intervertebral disc space narrowing with diffuse disc osteophyte complex. Broad posterior component indents and partially effaces the ventral thecal sac, asymmetric to the right. Prior posterior decompression. Persistent mild flattening of the ventral cord related to disc osteophyte without significant spinal stenosis. Bilateral uncovertebral and facet hypertrophy with resultant moderate right worse than left C6 foraminal narrowing.  C6-C7: Diffuse disc bulge with bilateral uncovertebral hypertrophy. Bulging disc flattens and effaces the ventral thecal sac. Cephalad and caudad migration of disc material noted. Superimposed bilateral facet hypertrophy. There is resultant moderate to severe spinal stenosis with mild cord flattening, but no appreciable cord signal changes. Thecal sac measures 5-6 mm in AP diameter. Overall, appearance is mildly progressed from previous. Left worse than right uncovertebral and facet hypertrophy with resultant moderate left worse than right C7 foraminal stenosis.  C7-T1: Disc desiccation without significant disc bulge. Mild  facet hypertrophy. No significant spinal stenosis. Borderline mild left C8 foraminal stenosis. Right neural foramina remains patent.  Visualized upper thoracic spine demonstrates no significant finding.  IMPRESSION: MRI HEAD IMPRESSION:  1. No acute intracranial abnormality. 2. Mild age-related cerebral atrophy. Otherwise unremarkable and normal brain MRI.  MRI CERVICAL SPINE IMPRESSION:  1. No acute abnormality within the cervical spine. 2. Postoperative changes from prior posterior decompression and fusion at C3 through C6 without significant residual spinal stenosis. 3. 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 4. Patchy cord signal abnormality involving the bilateral cord at the level of C5-6, consistent with chronic myelomalacia. 5. Multifactorial degenerative changes with resultant multilevel foraminal narrowing as above. Notable findings include moderate left C5 foraminal stenosis, moderate right worse than left C6 foraminal narrowing, with moderate left worse than right C7 foraminal stenosis.   Electronically Signed By: Jeannine Boga M.D. On: 03/02/2020 01:53   Impression   BRAIN Tyler Lester is a 75 y.o. male with PMH significant for Parkinson's disease, back pain, BPH, CHF, CKD 3, depression, diabetes, HTN, GERD, HLD. His neurologic examination is notable for mild left leg weakness and decreased sensation in Rt UE. MRI Brain was negative for acute stroke. We  increased his Sinemet to 2 tabs 25/100 4 times daily and AKI has been managed with IV fluids. MRI cervical spine showed no acute abnormality, revealed moderate to severe spinal stenosis stenosis (C6-7). Pt is making improvement in strength in Left leg. We recommend increase compliance of medication regimen for parkinson's  disease and continue treatment of AKI. Pt will F/U with Spine center for chronic cervical stenosis.    Recommendations  -Continue Sinemet 2 tablets 25/104 times daily. -Encourage compliance with medications. -Continue treating AKI. -Follow-up with spine center on outpatient basis for spinal stenosis. - Follow up with Dr. Wells Guiles Tat with Tyler Lester neurology. - Neurology inpatient team will signoff. Please feel free to contact us with any questions or concerns.  ______________________________________________________________________   Thank you for the opportunity to take part in the care of this patient. If you have any further questions, please contact the neurology consultation attending.  Signed,  Dr. Armando Reichert MD PGY1 [facility] Neurology

## 2020-03-02 NOTE — ED Notes (Addendum)
Pt to vascular.  Updated pt's caregiver. [phone].

## 2020-03-03 DIAGNOSIS — R29818 Other symptoms and signs involving the nervous system: Secondary | ICD-10-CM | POA: Diagnosis not present

## 2020-03-03 LAB — BASIC METABOLIC PANEL
Anion gap: 10 (ref 5–15)
BUN: 28 mg/dL — ABNORMAL HIGH (ref 8–23)
CO2: 26 mmol/L (ref 22–32)
Calcium: 9.5 mg/dL (ref 8.9–10.3)
Chloride: 105 mmol/L (ref 98–111)
Creatinine, Ser: 1.79 mg/dL — ABNORMAL HIGH (ref 0.61–1.24)
GFR, Estimated: 40 mL/min — ABNORMAL LOW (ref >60)
Glucose, Bld: 98 mg/dL (ref 70–99)
Potassium: 4.3 mmol/L (ref 3.5–5.1)
Sodium: 141 mmol/L (ref 135–145)

## 2020-03-03 LAB — GLUCOSE, CAPILLARY
Glucose-Capillary: 103 mg/dL — ABNORMAL HIGH (ref 70–99)
Glucose-Capillary: 96 mg/dL (ref 70–99)

## 2020-03-03 LAB — ECHOCARDIOGRAM COMPLETE
Area-P 1/2: 3.72 cm2
Calc EF: 55.6 %
S' Lateral: 2.9 cm
Single Plane A2C EF: 51.8 %
Single Plane A4C EF: 57.6 %

## 2020-03-03 MED ORDER — CARBIDOPA-LEVODOPA 25-100 MG PO TABS: 2.0000 | ORAL_TABLET | Freq: Four times a day (QID) | ORAL | 0 refills | Status: DC

## 2020-03-03 MED ORDER — ASPIRIN 81 MG PO TBEC: 81.0000 mg | DELAYED_RELEASE_TABLET | Freq: Every day | ORAL | 0 refills | Status: AC

## 2020-03-03 MED ORDER — AMLODIPINE BESYLATE 5 MG PO TABS: 5.0000 mg | ORAL_TABLET | Freq: Every day | ORAL | Status: DC

## 2020-03-03 MED ORDER — AMLODIPINE BESYLATE 5 MG PO TABS: 5.0000 mg | ORAL_TABLET | Freq: Every day | ORAL | 0 refills | Status: DC

## 2020-03-03 MED ORDER — ALUM & MAG HYDROXIDE-SIMETH 200-200-20 MG/5ML PO SUSP
30.0000 mL | Freq: Four times a day (QID) | ORAL | Status: DC | PRN
  Administered 2020-03-03: 30 mL via ORAL
  Filled 2020-03-03: qty 30

## 2020-03-03 NOTE — TOC Transition Note (Signed)
Transition of Care [facility]) - CM/SW Discharge Note   Patient Details  Name: Tyler Lester MRN: [id number] Date of Birth: 02-03-1946  Transition of Care [facility]) CM/SW Contact:  Kermit Balo, RN Phone Number: 03/03/2020, 11:45 AM   Clinical Narrative:    Pt discharging home with [facility] services through [facility]. Cory with [facility] accepted the referral. Wheelchair for home ordered and will be delivered to the room per [employer]. CM spoke to pts friend: Tyler Lester and she states the patient has 24 hour care at home. CM went over the PT/OT notes and she says he will have care at home he needs. She is going to provide transport home.    Final next level of care: Home w Home Health Services Barriers to Discharge: No Barriers Identified   Patient Goals and CMS Choice   CMS Medicare.gov Compare Post Acute Care list provided to:: Patient Represenative (must comment) Choice offered to / list presented to :  (Friend)  Discharge Placement                       Discharge Plan and Services                DME Arranged: Wheelchair manual DME Agency: [employer] Date DME Agency Contacted: 03/03/20   Representative spoke with at DME Agency: Velna Hatchet [facility] Arranged: PT,OT,Nurse's Aide,Social Work [facility] Agency: [facility] Date [facility] Agency Contacted: 03/03/20   Representative spoke with at [facility] Agency: Kandee Keen  Social Determinants of Health (SDOH) Interventions     Readmission Risk Interventions No flowsheet data found.

## 2020-03-03 NOTE — Discharge Instructions (Signed)
Weakness Weakness is a lack of strength. You may feel weak all over your body (generalized), or you may feel weak in one part of your body (focal). There are many potential causes of weakness. Sometimes, the cause of your weakness may not be known. Some causes of weakness can be serious, so it is important to see your doctor. Follow these instructions at home: Activity  Rest as needed.  Try to get enough sleep. Most adults need 7-8 hours of sleep each night. Talk to your doctor about how much sleep you need each night.  Do exercises, such as arm curls and leg raises, for 30 minutes at least 2 days a week or as told by your doctor.  Think about working with a physical therapist or trainer to help you get stronger. General instructions   Take over-the-counter and prescription medicines only as told by your doctor.  Eat a healthy, well-balanced diet. This includes: ? Proteins to build muscles, such as lean meats and fish. ? Fresh fruits and vegetables. ? Carbohydrates to boost energy, such as whole grains.  Drink enough fluid to keep your pee (urine) pale yellow.  Keep all follow-up visits as told by your doctor. This is important. Contact a doctor if:  Your weakness does not get better or it gets worse.  Your weakness affects your ability to: ? Think clearly. ? Do your normal daily activities. Get help right away if you:  Have sudden weakness on one side of your face or body.  Have chest pain.  Have trouble breathing or shortness of breath.  Have problems with your vision.  Have trouble talking or swallowing.  Have trouble standing or walking.  Are light-headed.  Pass out (lose consciousness). Summary  Weakness is a lack of strength. You may feel weak all over your body or just in one part of your body.  There are many potential causes of weakness. Sometimes, the cause of your weakness may not be known.  Rest as needed, and try to get enough sleep. Most adults  need 7-8 hours of sleep each night.  Eat a healthy, well-balanced diet. This information is not intended to replace advice given to you by your health care provider. Make sure you discuss any questions you have with your health care provider. Document Revised: 09/18/2017 Document Reviewed: 09/18/2017 Elsevier Patient Education  [place] Disease Parkinson's disease causes problems with movements. It is a long-term condition. It gets worse over time (is progressive). It affects each person in different ways. It makes it harder for you to:  Control how your body moves.  Move your body normally. The condition can range from mild to very bad (advanced). What are the causes? This condition results from a loss of brain cells called neurons. These brain cells make a chemical called dopamine, which is needed to control body movement. As the condition gets worse, the brain cells make less dopamine. This makes it hard to move or control your movements. The exact cause of this condition is not known. What increases the risk?  Being male.  Being age 61 or older.  Having family members who had Parkinson's disease.  Having had an injury to the brain.  Being very sad (depressed).  Being around things that are harmful or poisonous. What are the signs or symptoms? Symptoms of this condition can vary. The main symptoms have to do with movement. These include:  A tremor or shaking while you are resting that you cannot control.  Stiffness in your neck, arms, and legs.  Slowing of movement. This may include: ? Losing expressions of the face. ? Having trouble making small movements that are needed to button your clothing or brush your teeth.  Walking in a way that is not normal. You may walk with short, shuffling steps.  Loss of balance when standing. You may sway, fall backward, or have trouble making turns. Other symptoms include:  Being very sad, worried, or  confused.  Seeing or hearing things that are not real.  Losing thinking abilities (dementia).  Trouble speaking or swallowing.  Having a hard time pooping (constipation).  Needing to pee right away, peeing often, or not being able to control when you pee or poop.  Sleep problems. How is this treated? There is no cure. The goal of treatment is to manage your symptoms. Treatment may include:  Medicines.  Therapy to help with talking or movement.  Surgery to reduce shaking and other movements that you cannot control. Follow these instructions at home: Medicines  Take over-the-counter and prescription medicines only as told by your doctor.  Avoid taking pain or sleeping medicines. Eating and drinking  Follow instructions from your doctor about what you cannot eat or drink.  Do not drink alcohol. Activity  Talk with your doctor about if it is safe for you to drive.  Do exercises as told by your doctor. Lifestyle      Put in grab bars and railings in your home. These help to prevent falls.  Do not use any products that contain nicotine or tobacco, such as cigarettes, e-cigarettes, and chewing tobacco. If you need help quitting, ask your doctor.  Join a support group. General instructions  Talk with your doctor about what you need help with and what your safety needs are.  Keep all follow-up visits as told by your doctor, including any therapy visits to help with talking or moving. This is important. Contact a doctor if:  Medicines do not help your symptoms.  You feel off-balance.  You fall at home.  You need more help at home.  You have trouble swallowing.  You have a very hard time pooping.  You have a lot of side effects from your medicines.  You feel very sad, worried, or confused. Get help right away if:  You were hurt in a fall.  You see or hear things that are not real.  You cannot swallow without choking.  You have chest pain or trouble  breathing.  You do not feel safe at home.  You have thoughts about hurting yourself or others. If you ever feel like you may hurt yourself or others, or have thoughts about taking your own life, get help right away. You can go to your nearest emergency department or call:  Your local emergency services (911 in the [place]  A suicide crisis helpline, such as the [place] at (561)006-3414. This is open 24 hours a day. Summary  This condition causes problems with movements.  It is a long-term condition. It gets worse over time.  There is no cure. Treatment focuses on managing your symptoms.  Talk with your doctor about what you need help with and what your safety needs are.  Keep all follow-up visits as told by your doctor. This is important. This information is not intended to replace advice given to you by your health care provider. Make sure you discuss any questions you have with your health care provider. Document Revised: 05/01/2018  Document Reviewed: 05/01/2018 Elsevier Patient Education  [employer].

## 2020-03-03 NOTE — Progress Notes (Signed)
Wheelchair:   Patient suffers from ambulatory dysfunction which impairs their ability to perform daily activities like walking in the home. A walker will not resolve issue with performing activities of daily living. A wheelchair will allow patient to safely perform daily activities. Patient can safely propel the wheelchair in the home or has a caregiver who can provide assistance. Length of need 6 months.  Accessories: elevating leg rests, wheel locks, extensions and anti-tippers.

## 2020-03-03 NOTE — Progress Notes (Signed)
Pt discharge instructions gone over with patient and caretaker. IV removed and telemetry discontinued.  Pt belongings returned and all questions answered.   Melony Overly, RN

## 2020-03-03 NOTE — Discharge Summary (Signed)
Discharge Summary  Tyler Lester [id number] DOB: 09-17-1945  PCP: Tyler Oliphant, MD  Admit date: 03/01/2020 Discharge date: 03/03/2020  Time spent: 35 minutes  Recommendations for Outpatient Follow-up:  1. Follow up with your neurologist in 1 to 2 weeks. 2. Follow-up with your spine surgeon/PMR in 1 to 2 weeks. 3. Follow up with your PCP in 1 to 2 weeks. 4. Take your medications as prescribed 5. Continue PT OT with assistance and fall precautions   Discharge Diagnoses:  Active Hospital Problems   Diagnosis Date Noted  . Acute focal neurological deficit 03/01/2020  . Hyperlipidemia 11/05/2016  . Type 2 diabetes mellitus with complication ([place]) 123XX123  . Parkinson's disease ([place]) 07/23/2014  . Postlaminectomy syndrome, cervical region 07/23/2014  . CKD (chronic kidney disease) stage 3, GFR 30-59 ml/min (H[facility]) 08/25/2013  . Acute renal failure superimposed on stage 3 chronic kidney disease ([place]) 05/08/2013    Resolved Hospital Problems  No resolved problems to display.    Discharge Condition: Stable  Diet recommendation: Resume previous diet  Vitals:   03/03/20 0348 03/03/20 0758  BP: 130/64 127/82  Pulse: 84 85  Resp: 14 17  Temp: 97.8 F (36.6 C) 98.1 F (36.7 C)  SpO2: 98% 97%    History of present illness:  Tyler Lester a 75 y.o.malewith medical history significant ofParkinson's disease, back pain, BPH, CHF, CKD 3, depression, diabetes, hypertension, GERD, hyperlipidemia, gout who presents to [facility] ED with complaints of 1 day of left-sided weakness. History obtained with assistance of caregiver. Yesterday evening he was able to walk with his walker. On the day of presentation, caregivercame to see him and he was unable to stand on his own and was requiring significant assistance. He also had some increased drooling on his right side which prompted his visit to the ER due to concern for stroke.   ED Course:Vital signs in the ED  significant for elevated BP.  Lab work-up showed BMP with creatinine of 2.6 elevated from baseline of 1.6. Respiratory panel for flu and Covid negative. Chest x-ray showed atelectasis versus pneumonia in the left lower lobe. CT head was normal. Neurology was consulted and recommend MRI of brain as well as C-spine as he has a history of C-spine surgery.  MRI brain revealed no acute intracranial findings.  Cervical spine MRI shows no acute abnormality within the cervical spine, no significant residual spinal stenosis at C3 through C6.    Patient was seen by neurology who recommended to increase dose of Sinemet to 2 tabs of 25/100 milligramsmg for 4 times daily.  This new regimen improved his symptomatology significantly.  03/03/20: Patient was seen and examined at his bedside.  There were no acute events overnight.  This morning he feels better.  His tremors are much improved.  He has no new complaints.  Okay to discharge from a neurological standpoint.  Patient will need to follow-up with his neurologist Tyler Lester and with spine surgery posthospitalization.  Patient was also seen by PT OT with recommendations for SNF.  Patient declines SNF and prefers to go home with home health services.  TOC assisting with home health services set up.  Hospital Course:  Principal Problem:   Acute focal neurological deficit Active Problems:   Acute renal failure superimposed on stage 3 chronic kidney disease (HCC)   CKD (chronic kidney disease) stage 3, GFR 30-59 ml/min (HCC)   Postlaminectomy syndrome, cervical region   Parkinson's disease ([place])   Type 2 diabetes mellitus with complication (  [place])   Hyperlipidemia  Acute focal neurologic deficit likely secondary to progressively worsening and undertreated Parkinson disease Acute CVA ruled out by MRI brain. He has residual spinal stenosis with no clear findings of myelopathy on exam per neurology. Presented with left-sided lower extremity weakness and right  mild facial droop with increased drooling, resolved. Seen by neurology, MRI brain negative for acute stroke. Recommended to increase Sinemet dose to 2 tablets 4 times a day. This regimen has significantly improved his symptomatology. Follow-up with neurology and spine surgery post optimization.  Degenerative spine disease Follow-up with PMR Tyler Lester in 1-2 weeks  Resolving AKI on CKD 3 Presented withcreatinine elevated to 2.6 from a baseline of 1.6 Improved with IV fluids Creatinine downtrending 1.7 from 2.63. Avoid nephrotoxins and dehydration. Follow-up with your PCP.  LE edema, mild Resume home Lasix Elevate your lower extremities Follow-up with your PCP  Undertreated Parkinson's disease Follow-up with your neurologist Tyler Lester in 1 to 2 weeks Take your medications as prescribed  Chest x-ray abnormality, likely atelectasis He has been afebrile with no leukocytosis Nontoxic-appearing. Out of bed to chair as tolerated Follow-up with your PCP  Glucose impaired tolerance -Hemoglobin A1c 6.1 on 03/02/2020 Follow up with your PCP  Hyperlipidemia LDL 90 on 03/02/2020 Continue atorvastatin  Gout Continue home allopurinol    Code Status: Full code  Consultants:  Neurology  Procedures:  None.  Antimicrobials:  None.    Discharge Exam: BP 127/82 (BP Location: Right Arm)   Pulse 85   Temp 98.1 F (36.7 C) (Oral)   Resp 17   Ht 5\' 9"  (1.753 m)   Wt 85.7 kg   SpO2 97%   BMI 27.90 kg/m  . General: 75 y.o. year-old male well developed well nourished in no acute distress.  Alert and oriented x3. . Cardiovascular: Regular rate and rhythm with no rubs or gallops.  No thyromegaly or JVD noted.   Marland Kitchen Respiratory: Clear to auscultation with no wheezes or rales. Good inspiratory effort. . Abdomen: Soft nontender nondistended with normal bowel sounds x4 quadrants. . Musculoskeletal:Trace LE edema bilaterally . Psychiatry: Mood is appropriate  for condition and setting  Discharge Instructions You were cared for by a hospitalist during your hospital stay. If you have any questions about your discharge medications or the care you received while you were in the hospital after you are discharged, you can call the unit and asked to speak with the hospitalist on call if the hospitalist that took care of you is not available. Once you are discharged, your primary care physician will handle any further medical issues. Please note that NO REFILLS for any discharge medications will be authorized once you are discharged, as it is imperative that you return to your primary care physician (or establish a relationship with a primary care physician if you do not have one) for your aftercare needs so that they can reassess your need for medications and monitor your lab values.   Allergies as of 03/03/2020      Reactions   Poison Ivy Treatments Hives   Pt denies allergy to this medication      Medication List    STOP taking these medications   lisinopril-hydrochlorothiazide 20-25 MG tablet Commonly known as: ZESTORETIC   metFORMIN 500 MG tablet Commonly known as: GLUCOPHAGE     TAKE these medications   allopurinol 300 MG tablet Commonly known as: ZYLOPRIM Take 300 mg by mouth daily.   amLODipine 5 MG tablet Commonly known as:  NORVASC Take 1 tablet (5 mg total) by mouth daily. Start taking on: March 04, 2020   aspirin 81 MG EC tablet Take 1 tablet (81 mg total) by mouth daily. Swallow whole. Start taking on: March 04, 2020   atorvastatin 20 MG tablet Commonly known as: LIPITOR Take 20 mg by mouth daily.   carbidopa-levodopa 25-100 MG tablet Commonly known as: Sinemet Take 2 tablets by mouth 4 (four) times daily. What changed:   how much to take  additional instructions   cyclobenzaprine 5 MG tablet Commonly known as: FLEXERIL Take 5 mg by mouth 3 (three) times daily as needed for muscle spasms.   donepezil 10 MG  tablet Commonly known as: ARICEPT Take 1 tablet by mouth every day   furosemide 40 MG tablet Commonly known as: LASIX Take 40 mg by mouth daily.   gabapentin 300 MG capsule Commonly known as: NEURONTIN Take 300 mg by mouth daily as needed (nerve pain).   omeprazole 20 MG capsule Commonly known as: PRILOSEC Take 20 mg by mouth daily.   oxyCODONE-acetaminophen 7.5-325 MG tablet Commonly known as: PERCOCET Take 1 tablet by mouth 4 (four) times daily as needed for moderate pain.   Potassium Chloride ER 20 MEQ Tbcr Take 1 tablet by mouth daily.   Vitamin D (Ergocalciferol) 1.25 MG (50000 UNIT) Caps capsule Commonly known as: DRISDOL Take 50,000 Units by mouth every Tuesday.            Durable Medical Equipment  (From admission, onward)         Start     Ordered   03/03/20 0700  For home use only DME standard manual wheelchair with seat cushion  Once       Comments: Patient suffers from ambulatory dysfunction which impairs their ability to perform daily activities like walking in the home.  A walker will not resolve issue with performing activities of daily living. A wheelchair will allow patient to safely perform daily activities. Patient can safely propel the wheelchair in the home or has a caregiver who can provide assistance. Length of need 6 months. Accessories: elevating leg rests, wheel locks, extensions and anti-tippers.   03/03/20 0516         Allergies  Allergen Reactions  . Poison Ivy Treatments Hives    Pt denies allergy to this medication    Follow-up Information    Tyler Oliphant, MD. Call in 1 day(s).   Specialty: Family Medicine Why: Please call for a post hospital follow-up appointment. Contact information: [address] [place] [address] [phone]        Lester, Eustace Quail, DO. Call in 1 day(s).   Specialty: Neurology Why: Please call for a post hospital follow-up appointment. Contact information: [place] [address] [phone]        Charlett Blake, MD. Call in 1 day(s).   Specialty: Physical Medicine and Rehabilitation Why: Please call for a post hospital follow-up appointment for your cervical spine degenerative disease. Contact information: [place] [address] [phone]                The results of significant diagnostics from this hospitalization (including imaging, microbiology, ancillary and laboratory) are listed below for reference.    Significant Diagnostic Studies: DG Chest 2 View  Result Date: 03/01/2020 CLINICAL DATA:  Hypotension. Additional history provided: Patient reports left leg pain and hypotension today. EXAM: CHEST - 2 VIEW COMPARISON:  Prior chest radiograph 09/25/2019. FINDINGS: Heart size within normal limits. Ill-defined opacity within the left lung base. The right lung is clear. No evidence of pleural effusion or pneumothorax. No acute bony abnormality is identified. IMPRESSION: Ill-defined opacity within the left lung base, which may reflect pneumonia or atelectasis. Radiographic follow-up to resolution is recommended. Electronically Signed   By: Kellie Simmering DO   On: 03/01/2020 13:38   CT Head Wo Contrast  Result Date: 03/01/2020 CLINICAL DATA:  Drooling for several days, Parkinson's disease, left leg pain EXAM: CT HEAD WITHOUT CONTRAST TECHNIQUE: Contiguous axial images were obtained from the base of the skull through the vertex without intravenous contrast. COMPARISON:  09/25/2019 FINDINGS: Brain: No acute infarct or hemorrhage. Lateral ventricles and midline structures are unremarkable. No acute extra-axial fluid collections. No mass effect. Vascular: No hyperdense vessel or unexpected calcification. Skull: Normal. Negative for fracture or focal lesion. Sinuses/Orbits: No acute finding. Other: None. IMPRESSION: 1. Stable head CT, no acute process. Electronically Signed   By: Randa Ngo M.D.   On:  03/01/2020 20:17   MR BRAIN WO CONTRAST  Result Date: 03/02/2020 CLINICAL DATA:  Initial evaluation for neuro deficit, stroke suspected, left-sided weakness. EXAM: MRI HEAD WITHOUT CONTRAST MRI CERVICAL SPINE WITHOUT CONTRAST TECHNIQUE: Multiplanar, multiecho pulse sequences of the brain and surrounding structures, and cervical spine, to include the craniocervical junction and cervicothoracic junction, were obtained without intravenous contrast. COMPARISON:  Prior CT from 03/01/2020 as well as previous MRI from 11/12/2013. FINDINGS: MRI HEAD FINDINGS Brain: Mildly advanced age-related cerebral atrophy. No significant cerebral white matter disease. No abnormal foci of restricted diffusion to suggest acute or subacute ischemia. Gray-white matter differentiation maintained. No encephalomalacia to suggest chronic cortical infarction. No evidence for acute or chronic intracranial hemorrhage. No mass lesion, midline shift or mass effect. No hydrocephalus or extra-axial fluid collection. Pituitary gland mildly prominent with convex border superiorly without visible discrete lesion. Suprasellar region normal. Midline structures intact. Vascular: Major intracranial vascular flow voids are maintained. Skull and upper cervical spine: Craniocervical junction within normal limits. Bone marrow signal intensity normal. No scalp soft tissue abnormality. Sinuses/Orbits: Patient status post ocular lens replacement on the left. Globes and orbital soft tissues demonstrate no acute finding. Paranasal sinuses are largely clear. Small chronic appearing bilateral mastoid effusions noted, of doubtful significance. Inner ear structures grossly normal. Other: None. MRI CERVICAL SPINE FINDINGS Alignment: Straightening of the normal cervical lordosis. Trace 2 mm retrolisthesis of C5 on C6, likely chronic and degenerative. Vertebrae: Patient status post posterior decompression at C4 through C6, with posterior fusion extending from C3 through  C6. Evaluation of the hardware limited by MRI. Vertebral body height maintained without acute or interval fracture. Bone marrow signal intensity overall within normal limits. No discrete or worrisome osseous lesions. Increased T2 signal intensity without associated STIR signal intensity within the C5-6 interspace favored to be degenerative in nature. No paraspinous edema to suggest infection. Cord: Patchy cord signal abnormality seen involving the bilateral cord at the level of C5-6, consistent with chronic myelomalacia. Signal intensity within the cervical spinal cord otherwise within normal limits. Posterior Fossa, vertebral arteries, paraspinal tissues: Craniocervical junction within normal limits. Chronic postoperative scarring noted within the posterior paraspinous soft tissues. Paraspinous soft tissues demonstrate no acute finding. Normal flow voids seen within the vertebral arteries bilaterally. Disc levels: C2-C3: Shallow central disc osteophyte complex mildly indents the ventral thecal sac. Superimposed facet and ligament flavum hypertrophy. Resultant mild spinal stenosis with minimal cord flattening, but no cord signal changes.  Foramina remain patent. C3-C4: Shallow central disc protrusion with annular fissure. Secondary mild flattening of the ventral thecal sac without significant spinal stenosis. Superimposed mild uncovertebral and facet hypertrophy with resultant mild bilateral C4 foraminal stenosis, slightly worse on the left. C4-C5: Lobulated and irregular central disc osteophyte complex flattens and effaces the ventral thecal sac. Patient is status post posterior decompression with fusion. Residual minimal flattening of the ventral cord related to disc osteophyte, but no significant spinal stenosis. Left greater than right uncovertebral and facet hypertrophy with resultant moderate left and mild right C5 foraminal stenosis. C5-C6: Degenerative intervertebral disc space narrowing with diffuse disc  osteophyte complex. Broad posterior component indents and partially effaces the ventral thecal sac, asymmetric to the right. Prior posterior decompression. Persistent mild flattening of the ventral cord related to disc osteophyte without significant spinal stenosis. Bilateral uncovertebral and facet hypertrophy with resultant moderate right worse than left C6 foraminal narrowing. C6-C7: Diffuse disc bulge with bilateral uncovertebral hypertrophy. Bulging disc flattens and effaces the ventral thecal sac. Cephalad and caudad migration of disc material noted. Superimposed bilateral facet hypertrophy. There is resultant moderate to severe spinal stenosis with mild cord flattening, but no appreciable cord signal changes. Thecal sac measures 5-6 mm in AP diameter. Overall, appearance is mildly progressed from previous. Left worse than right uncovertebral and facet hypertrophy with resultant moderate left worse than right C7 foraminal stenosis. C7-T1: Disc desiccation without significant disc bulge. Mild facet hypertrophy. No significant spinal stenosis. Borderline mild left C8 foraminal stenosis. Right neural foramina remains patent. Visualized upper thoracic spine demonstrates no significant finding. IMPRESSION: MRI HEAD IMPRESSION: 1. No acute intracranial abnormality. 2. Mild age-related cerebral atrophy. Otherwise unremarkable and normal brain MRI. MRI CERVICAL SPINE IMPRESSION: 1. No acute abnormality within the cervical spine. 2. Postoperative changes from prior posterior decompression and fusion at C3 through C6 without significant residual spinal stenosis. 3. 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 4. Patchy cord signal abnormality involving the bilateral cord at the level of C5-6, consistent with chronic myelomalacia. 5. Multifactorial degenerative changes with resultant multilevel foraminal narrowing as above. Notable findings include  moderate left C5 foraminal stenosis, moderate right worse than left C6 foraminal narrowing, with moderate left worse than right C7 foraminal stenosis. Electronically Signed   By: Jeannine Boga M.D.   On: 03/02/2020 01:53   MR CERVICAL SPINE WO CONTRAST  Result Date: 03/02/2020 CLINICAL DATA:  Initial evaluation for neuro deficit, stroke suspected, left-sided weakness. EXAM: MRI HEAD WITHOUT CONTRAST MRI CERVICAL SPINE WITHOUT CONTRAST TECHNIQUE: Multiplanar, multiecho pulse sequences of the brain and surrounding structures, and cervical spine, to include the craniocervical junction and cervicothoracic junction, were obtained without intravenous contrast. COMPARISON:  Prior CT from 03/01/2020 as well as previous MRI from 11/12/2013. FINDINGS: MRI HEAD FINDINGS Brain: Mildly advanced age-related cerebral atrophy. No significant cerebral white matter disease. No abnormal foci of restricted diffusion to suggest acute or subacute ischemia. Gray-white matter differentiation maintained. No encephalomalacia to suggest chronic cortical infarction. No evidence for acute or chronic intracranial hemorrhage. No mass lesion, midline shift or mass effect. No hydrocephalus or extra-axial fluid collection. Pituitary gland mildly prominent with convex border superiorly without visible discrete lesion. Suprasellar region normal. Midline structures intact. Vascular: Major intracranial vascular flow voids are maintained. Skull and upper cervical spine: Craniocervical junction within normal limits. Bone marrow signal intensity normal. No scalp soft tissue abnormality. Sinuses/Orbits: Patient status post ocular lens replacement on the left. Globes and orbital  soft tissues demonstrate no acute finding. Paranasal sinuses are largely clear. Small chronic appearing bilateral mastoid effusions noted, of doubtful significance. Inner ear structures grossly normal. Other: None. MRI CERVICAL SPINE FINDINGS Alignment: Straightening of the  normal cervical lordosis. Trace 2 mm retrolisthesis of C5 on C6, likely chronic and degenerative. Vertebrae: Patient status post posterior decompression at C4 through C6, with posterior fusion extending from C3 through C6. Evaluation of the hardware limited by MRI. Vertebral body height maintained without acute or interval fracture. Bone marrow signal intensity overall within normal limits. No discrete or worrisome osseous lesions. Increased T2 signal intensity without associated STIR signal intensity within the C5-6 interspace favored to be degenerative in nature. No paraspinous edema to suggest infection. Cord: Patchy cord signal abnormality seen involving the bilateral cord at the level of C5-6, consistent with chronic myelomalacia. Signal intensity within the cervical spinal cord otherwise within normal limits. Posterior Fossa, vertebral arteries, paraspinal tissues: Craniocervical junction within normal limits. Chronic postoperative scarring noted within the posterior paraspinous soft tissues. Paraspinous soft tissues demonstrate no acute finding. Normal flow voids seen within the vertebral arteries bilaterally. Disc levels: C2-C3: Shallow central disc osteophyte complex mildly indents the ventral thecal sac. Superimposed facet and ligament flavum hypertrophy. Resultant mild spinal stenosis with minimal cord flattening, but no cord signal changes. Foramina remain patent. C3-C4: Shallow central disc protrusion with annular fissure. Secondary mild flattening of the ventral thecal sac without significant spinal stenosis. Superimposed mild uncovertebral and facet hypertrophy with resultant mild bilateral C4 foraminal stenosis, slightly worse on the left. C4-C5: Lobulated and irregular central disc osteophyte complex flattens and effaces the ventral thecal sac. Patient is status post posterior decompression with fusion. Residual minimal flattening of the ventral cord related to disc osteophyte, but no significant  spinal stenosis. Left greater than right uncovertebral and facet hypertrophy with resultant moderate left and mild right C5 foraminal stenosis. C5-C6: Degenerative intervertebral disc space narrowing with diffuse disc osteophyte complex. Broad posterior component indents and partially effaces the ventral thecal sac, asymmetric to the right. Prior posterior decompression. Persistent mild flattening of the ventral cord related to disc osteophyte without significant spinal stenosis. Bilateral uncovertebral and facet hypertrophy with resultant moderate right worse than left C6 foraminal narrowing. C6-C7: Diffuse disc bulge with bilateral uncovertebral hypertrophy. Bulging disc flattens and effaces the ventral thecal sac. Cephalad and caudad migration of disc material noted. Superimposed bilateral facet hypertrophy. There is resultant moderate to severe spinal stenosis with mild cord flattening, but no appreciable cord signal changes. Thecal sac measures 5-6 mm in AP diameter. Overall, appearance is mildly progressed from previous. Left worse than right uncovertebral and facet hypertrophy with resultant moderate left worse than right C7 foraminal stenosis. C7-T1: Disc desiccation without significant disc bulge. Mild facet hypertrophy. No significant spinal stenosis. Borderline mild left C8 foraminal stenosis. Right neural foramina remains patent. Visualized upper thoracic spine demonstrates no significant finding. IMPRESSION: MRI HEAD IMPRESSION: 1. No acute intracranial abnormality. 2. Mild age-related cerebral atrophy. Otherwise unremarkable and normal brain MRI. MRI CERVICAL SPINE IMPRESSION: 1. No acute abnormality within the cervical spine. 2. Postoperative changes from prior posterior decompression and fusion at C3 through C6 without significant residual spinal stenosis. 3. 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 4. Patchy cord signal  abnormality involving the bilateral cord at the level of C5-6, consistent with chronic myelomalacia. 5. Multifactorial degenerative changes with resultant multilevel foraminal narrowing as above. Notable findings include moderate left C5 foraminal  stenosis, moderate right worse than left C6 foraminal narrowing, with moderate left worse than right C7 foraminal stenosis. Electronically Signed   By: Rise MuBenjamin  McClintock M.D.   On: 03/02/2020 01:53   ECHOCARDIOGRAM COMPLETE  Result Date: 03/03/2020    ECHOCARDIOGRAM REPORT   Patient Name:   Tyler Lester Date of Exam: 03/02/2020 Medical Rec #:  [id number]       Height:       69.0 in Accession #:    [id number]      Weight:       192.5 lb Date of Birth:  07/29/1945        BSA:          2.033 m Patient Age:    74 years        BP:           110/61 mmHg Patient Gender: M               HR:           84 bpm. Exam Location:  Inpatient Procedure: 2D Echo, Cardiac Doppler, Color Doppler and Intracardiac            Opacification Agent Indications:     TIA 435.9 / G45.9  History:         Patient has prior history of Echocardiogram examinations, most                  recent 03/14/2018. Signs/Symptoms:Shortness of Breath; Risk                  Factors:Hypertension, Diabetes, Dyslipidemia and Non-Smoker.  Sonographer:     Renella CunasJulia Swaim RDCS Referring Phys:  [id number] Cecille PoALEXANDER B MELVIN Diagnosing Phys: Armanda Magicraci Turner MD IMPRESSIONS  1. Left ventricular ejection fraction, by estimation, is 55 to 60%. The left ventricle has normal function. The left ventricle has no regional wall motion abnormalities. Left ventricular diastolic parameters are indeterminate.  2. Right ventricular systolic function is normal. The right ventricular size is normal. Tricuspid regurgitation signal is inadequate for assessing PA pressure.  3. The mitral valve is normal in structure. No evidence of mitral valve regurgitation. No evidence of mitral stenosis.  4. The aortic valve is tricuspid. Aortic valve  regurgitation is not visualized. Mild aortic valve sclerosis is present, with no evidence of aortic valve stenosis.  5. The inferior vena cava is normal in size with greater than 50% respiratory variability, suggesting right atrial pressure of 3 mmHg. FINDINGS  Left Ventricle: Left ventricular ejection fraction, by estimation, is 55 to 60%. The left ventricle has normal function. The left ventricle has no regional wall motion abnormalities. Definity contrast agent was given IV to delineate the left ventricular  endocardial borders. The left ventricular internal cavity size was normal in size. There is no left ventricular hypertrophy. Left ventricular diastolic parameters are indeterminate. Normal left ventricular filling pressure. Right Ventricle: The right ventricular size is normal. No increase in right ventricular wall thickness. Right ventricular systolic function is normal. Tricuspid regurgitation signal is inadequate for assessing PA pressure. Left Atrium: Left atrial size was normal in size. Right Atrium: Right atrial size was normal in size. Pericardium: There is no evidence of pericardial effusion. Mitral Valve: The mitral valve is normal in structure. No evidence of mitral valve regurgitation. No evidence of mitral valve stenosis. Tricuspid Valve: The tricuspid valve is normal in structure. Tricuspid valve regurgitation is not demonstrated. No evidence of tricuspid stenosis. Aortic Valve: The aortic valve is tricuspid. Aortic  valve regurgitation is not visualized. Mild aortic valve sclerosis is present, with no evidence of aortic valve stenosis. Pulmonic Valve: The pulmonic valve was normal in structure. Pulmonic valve regurgitation is not visualized. No evidence of pulmonic stenosis. Aorta: The aortic root is normal in size and structure. Venous: The inferior vena cava is normal in size with greater than 50% respiratory variability, suggesting right atrial pressure of 3 mmHg. IAS/Shunts: No atrial level  shunt detected by color flow Doppler.  LEFT VENTRICLE PLAX 2D LVIDd:         3.90 cm     Diastology LVIDs:         2.90 cm     LV e' medial:    6.24 cm/s LV PW:         0.80 cm     LV E/e' medial:  12.4 LV IVS:        0.80 cm     LV e' lateral:   7.14 cm/s LVOT diam:     2.30 cm     LV E/e' lateral: 10.8 LV SV:         71 LV SV Index:   35 LVOT Area:     4.15 cm  LV Volumes (MOD) LV vol d, MOD A2C: 81.6 ml LV vol d, MOD A4C: 94.2 ml LV vol s, MOD A2C: 39.3 ml LV vol s, MOD A4C: 39.9 ml LV SV MOD A2C:     42.3 ml LV SV MOD A4C:     94.2 ml LV SV MOD BP:      49.6 ml RIGHT VENTRICLE RV S prime:     12.20 cm/s TAPSE (M-mode): 1.7 cm LEFT ATRIUM             Index       RIGHT ATRIUM          Index LA diam:        3.10 cm 1.53 cm/m  RA Area:     8.90 cm LA Vol (A2C):   22.2 ml 10.92 ml/m RA Volume:   16.80 ml 8.26 ml/m LA Vol (A4C):   17.6 ml 8.66 ml/m LA Biplane Vol: 20.1 ml 9.89 ml/m  AORTIC VALVE LVOT Vmax:   78.30 cm/s LVOT Vmean:  58.400 cm/s LVOT VTI:    0.170 m  AORTA Ao Root diam: 3.40 cm MITRAL VALVE MV Area (PHT): 3.72 cm    SHUNTS MV Decel Time: 204 msec    Systemic VTI:  0.17 m MV E velocity: 77.10 cm/s  Systemic Diam: 2.30 cm MV A velocity: 77.10 cm/s MV E/A ratio:  1.00 Fransico Him MD Electronically signed by Fransico Him MD Signature Date/Time: 03/02/2020/2:53:51 PM    Final (Updated)    VAS US CAROTID (at [facility] and [facility] only)  Result Date: 03/02/2020 Carotid Arterial Duplex Study Indications:       TIA and Left side weakness. Risk Factors:      Hypertension, hyperlipidemia, Diabetes, no history of                    smoking. Other Factors:     CKD3, CHF. Comparison Study:  No prior carotid studies. Performing Technologist: Rogelia Rohrer  Examination Guidelines: A complete evaluation includes B-mode imaging, spectral Doppler, color Doppler, and power Doppler as needed of all accessible portions of each vessel. Bilateral testing is considered an integral part of a complete examination. Limited examinations  for reoccurring indications may be performed as noted.  Right Carotid Findings: +----------+--------+--------+--------+------------------+------------------+  PSV cm/sEDV cm/sStenosisPlaque DescriptionComments           +----------+--------+--------+--------+------------------+------------------+ CCA Prox  145     8               heterogenous      intimal thickening +----------+--------+--------+--------+------------------+------------------+ CCA Distal123     0               heterogenous      intimal thickening +----------+--------+--------+--------+------------------+------------------+ ICA Prox  51      8       1-39%   focal                                +----------+--------+--------+--------+------------------+------------------+ ICA Distal58      13              focal                                +----------+--------+--------+--------+------------------+------------------+ ECA       83      0                                                    +----------+--------+--------+--------+------------------+------------------+ +----------+--------+-------+----------------+-------------------+           PSV cm/sEDV cmsDescribe        Arm Pressure (mmHG) +----------+--------+-------+----------------+-------------------+ [id number]     0      Multiphasic, WNL                    +----------+--------+-------+----------------+-------------------+ +---------+--------+--+--------+-+---------+ VertebralPSV cm/s42EDV cm/s7Antegrade +---------+--------+--+--------+-+---------+  Left Carotid Findings: +----------+--------+--------+--------+------------------+------------------+           PSV cm/sEDV cm/sStenosisPlaque DescriptionComments           +----------+--------+--------+--------+------------------+------------------+ CCA Prox  128     0                                 intimal thickening  +----------+--------+--------+--------+------------------+------------------+ CCA Distal128     0               heterogenous      intimal thickening +----------+--------+--------+--------+------------------+------------------+ ICA Prox  52      14      1-39%                                        +----------+--------+--------+--------+------------------+------------------+ ICA Distal55      15                                                   +----------+--------+--------+--------+------------------+------------------+ ECA       129     0                                                    +----------+--------+--------+--------+------------------+------------------+ +----------+--------+--------+----------------+-------------------+  PSV cm/sEDV cm/sDescribe        Arm Pressure (mmHG) +----------+--------+--------+----------------+-------------------+ Subclavian130     0       Multiphasic, WNL                    +----------+--------+--------+----------------+-------------------+ +---------+--------+--+--------+-+---------+ VertebralPSV cm/s38EDV cm/s6Antegrade +---------+--------+--+--------+-+---------+   Summary: Right Carotid: Velocities in the right ICA are consistent with a 1-39% stenosis.                The extracranial vessels were near-normal with only minimal wall                thickening or plaque. Left Carotid: Velocities in the left ICA are consistent with a 1-39% stenosis.               The extracranial vessels were near-normal with only minimal wall               thickening or plaque. Vertebrals: Bilateral vertebral arteries demonstrate antegrade flow. *See table(s) above for measurements and observations.     Preliminary     Microbiology: Recent Results (from the past 240 hour(s))  Resp Panel by RT-PCR (Flu A&B, Covid) Nasopharyngeal Swab     Status: None   Collection Time: 03/01/20  8:40 PM   Specimen: Nasopharyngeal Swab;  Nasopharyngeal(NP) swabs in vial transport medium  Result Value Ref Range Status   SARS Coronavirus 2 by RT PCR NEGATIVE NEGATIVE Final    Comment: (NOTE) SARS-CoV-2 target nucleic acids are NOT DETECTED.  The SARS-CoV-2 RNA is generally detectable in upper respiratory specimens during the acute phase of infection. The lowest concentration of SARS-CoV-2 viral copies this assay can detect is 138 copies/mL. A negative result does not preclude SARS-Cov-2 infection and should not be used as the sole basis for treatment or other patient management decisions. A negative result may occur with  improper specimen collection/handling, submission of specimen other than nasopharyngeal swab, presence of viral mutation(s) within the areas targeted by this assay, and inadequate number of viral copies(<138 copies/mL). A negative result must be combined with clinical observations, patient history, and epidemiological information. The expected result is Negative.  Fact Sheet for Patients:  EntrepreneurPulse.com.au  Fact Sheet for Healthcare Providers:  IncredibleEmployment.be  This test is no t yet approved or cleared by the [place] FDA and  has been authorized for detection and/or diagnosis of SARS-CoV-2 by FDA under an Emergency Use Authorization (EUA). This EUA will remain  in effect (meaning this test can be used) for the duration of the COVID-19 declaration under Section 564(b)(1) of the Act, 21 U.S.C.section 360bbb-3(b)(1), unless the authorization is terminated  or revoked sooner.       Influenza A by PCR NEGATIVE NEGATIVE Final   Influenza B by PCR NEGATIVE NEGATIVE Final    Comment: (NOTE) The Xpert Xpress SARS-CoV-2/FLU/RSV plus assay is intended as an aid in the diagnosis of influenza from Nasopharyngeal swab specimens and should not be used as a sole basis for treatment. Nasal washings and aspirates are unacceptable for Xpert Xpress  SARS-CoV-2/FLU/RSV testing.  Fact Sheet for Patients: EntrepreneurPulse.com.au  Fact Sheet for Healthcare Providers: IncredibleEmployment.be  This test is not yet approved or cleared by the [place] FDA and has been authorized for detection and/or diagnosis of SARS-CoV-2 by FDA under an Emergency Use Authorization (EUA). This EUA will remain in effect (meaning this test can be used) for the duration of the COVID-19 declaration under Section 564(b)(1) of the Act, 21 U.S.C. section 360bbb-3(b)(1),  unless the authorization is terminated or revoked.  Performed at [facility], [place] [address] [place], [place] [address]      Labs: Basic Metabolic Panel: Recent Labs  Lab 03/01/20 1321 03/03/20 1007  NA 143 141  K 4.6 4.3  CL 109 105  CO2 25 26  GLUCOSE 104* 98  BUN 52* 28*  CREATININE 2.63* 1.79*  CALCIUM 9.8 9.5   Liver Function Tests: Recent Labs  Lab 03/01/20 1321  AST 12*  ALT <5  ALKPHOS 40  BILITOT 1.0  PROT 7.7  ALBUMIN 3.7   No results for input(s): LIPASE, AMYLASE in the last 168 hours. No results for input(s): AMMONIA in the last 168 hours. CBC: Recent Labs  Lab 03/01/20 1321  WBC 6.5  NEUTROABS 4.3  HGB 14.9  HCT 49.9  MCV 89.6  PLT 204   Cardiac Enzymes: No results for input(s): CKTOTAL, CKMB, CKMBINDEX, TROPONINI in the last 168 hours. BNP: BNP (last 3 results) No results for input(s): BNP in the last 8760 hours.  ProBNP (last 3 results) No results for input(s): PROBNP in the last 8760 hours.  CBG: Recent Labs  Lab 03/02/20 0730 03/02/20 1535 03/02/20 1809 03/02/20 2110 03/03/20 0630  GLUCAP 82 104* 87 158* 103*       Signed:  Kayleen Memos, MD [facility] 03/03/2020, 11:32 AM

## 2020-03-03 NOTE — Evaluation (Signed)
Speech Language Pathology Evaluation Patient Details Name: Tyler Lester MRN: [id number] DOB: 03-07-1945 Today's Date: 03/03/2020 Time: 3710-6269 SLP Time Calculation (min) (ACUTE ONLY): 25 min  Problem List:  Patient Active Problem List   Diagnosis Date Noted  . Acute focal neurological deficit 03/01/2020  . Ischial pain, right 08/25/2019  . Palpitations 11/24/2018  . Onychomycosis 08/05/2017  . Chronic bilateral low back pain with bilateral sciatica 01/30/2017  . Memory change 11/09/2016  . Hyperlipidemia 11/05/2016  . Diabetic polyneuropathy associated with type 2 diabetes mellitus (HCC) 04/13/2016  . 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 Diminished hearing, left 01/20/2016  . Type 2 diabetes mellitus with complication (HCC) 11/21/2015  . Gait abnormality 03/28/2015  . Postlaminectomy syndrome, cervical region 07/23/2014  . Parkinson's disease (HCC) 07/23/2014  . Back pain with sciatica 05/27/2014  . Bilateral leg pain 05/14/2014  . Depression 10/15/2013  . CKD (chronic kidney disease) stage 3, GFR 30-59 ml/min (HCC) 08/25/2013  . Tremor 05/23/2013  . Acute renal failure superimposed on stage 3 chronic kidney disease (HCC) 05/08/2013  . Chronic diastolic heart failure (HCC) 05/06/2013  . Onychogryposis of toenail 05/14/2012  . GERD (gastroesophageal reflux disease) 07/04/2011  . Essential hypertension 01/05/2011   Past Medical History:  Past Medical History:  Diagnosis Date  . Bilateral foot pain   . Chronic pain   . Diabetes mellitus    type 2, diet controlled now  . Foley catheter in place since nov 2018, last changed 1 week ago  . Hyperlipidemia   . Hypertension   . Inguinal hernia    right  . Numbness    T/O  . Parkinson disease (HCC) 08/26/2015   Followed by [facility]. Last visit 06/03/2015. History of poor compliance with his medication. Referred him back to [facility] in 10/2016. They didn't want to see him back due to poor compliance with medical advise. Last  plan from 06/03/2015 still valid  . Parkinson's disease (HCC)   . Pneumonia yrs ago  . Shortness of breath    with exertion  . Urinary retention    Past Surgical History:  Past Surgical History:  Procedure Laterality Date  . CATARACT EXTRACTION W/ INTRAOCULAR LENS IMPLANT Left 06/2012   laser for posterior capsule?  . COLON SURGERY    . POSTERIOR CERVICAL FUSION/FORAMINOTOMY N/A 11/12/2013   Procedure: Posterior cervical decompression fusion, cervical 3-4, cervical 4-5, cervical 5-6, cervical 6-7 with instrumentation and allograft   (LEVEL 4);  Surgeon: Emilee Hero, MD;  Location: [facility] OR;  Service: Orthopedics;  Laterality: N/A;  Posterior cervical decompression fusion, cervical 3-4, cervical 4-5, cervical 5-6, cervical 6-7 with instrumentation and allograft  . TONSILLECTOMY    . XI ROBOTIC ASSISTED SIMPLE PROSTATECTOMY N/A 03/07/2016   Procedure: XI ROBOTIC ASSISTED SIMPLE PROSTATECTOMY AND UMBILICAL HERNIA REPAIR flexible cystoscopy;  Surgeon: Sebastian Ache, MD;  Location: [facility] ORS;  Service: Urology;  Laterality: N/A;   HPI:  75 y.o. male with medical history significant of Parkinson's disease, back pain, BPH, CHF, CKD 3, depression, diabetes, hypertension, GERD, hyperlipidemia, gout who presents with 1 day of left-sided weakness 03/02/20 MRI brain indicated No acute intracranial abnormality  Assessment / Plan / Recommendation Clinical Impression  Pt administered the SLUMS ([employer] Mental Status Examination) with a score obtained of 16/26 with written portion eliminated d/t right weakness/decreased legibility.  He exhibited deficits within areas of attention, memory, auditory comprehension and organization.  OME revealed generalized oral weakness.  He had difficulty with simple calculation tasks, retrieving  new information or retrieval of words after a time constraint and answering questions after a short paragraph (50% accuracy).  Baseline cognitive functioning not  determined as no family present during evaluation.  ST recommended while in acute care to determine baseline function and address cognitive/linguistic deficits.  Thank you for this consult.    SLP Assessment  SLP Recommendation/Assessment: Patient needs continued Speech Language Pathology Services SLP Visit Diagnosis: Cognitive communication deficit (R41.841)    Follow Up Recommendations  None    Frequency and Duration min 1 x/week  1 week      SLP Evaluation Cognition  Overall Cognitive Status: No family/caregiver present to determine baseline cognitive functioning Arousal/Alertness: Awake/alert Orientation Level: Oriented X4 Attention: Sustained Sustained Attention: Impaired Sustained Attention Impairment: Verbal basic;Functional basic Memory: Impaired Memory Impairment: Decreased recall of new information;Retrieval deficit Immediate Memory Recall: Sock;Blue;Bed Memory Recall Sock: Without Cue Memory Recall Blue: Without Cue Memory Recall Bed: With Cue Awareness: Appears intact       Comprehension  Auditory Comprehension Overall Auditory Comprehension: Impaired Yes/No Questions: Within Functional Limits Commands: Impaired Multistep Basic Commands: 25-49% accurate Conversation: Simple Visual Recognition/Discrimination Discrimination: Not tested Reading Comprehension Reading Status: Not tested    Expression Expression Primary Mode of Expression: Verbal Verbal Expression Overall Verbal Expression: Appears within functional limits for tasks assessed Level of Generative/Spontaneous Verbalization: Conversation Repetition: No impairment Naming: No impairment Non-Verbal Means of Communication: Not applicable Written Expression Dominant Hand: Right Written Expression: Unable to assess (comment) (right weakness impacted legibility)   Oral / Motor  Oral Motor/Sensory Function Overall Oral Motor/Sensory Function: Mild impairment Facial ROM: Reduced right Facial  Symmetry: Abnormal symmetry right Motor Speech Overall Motor Speech: Appears within functional limits for tasks assessed Respiration: Within functional limits Phonation: Normal Resonance: Within functional limits Articulation: Within functional limitis Intelligibility: Intelligible Motor Planning: Witnin functional limits Motor Speech Errors: Not applicable                       Elvina Sidle, M.S., CCC-SLP 03/03/2020, 2:36 PM

## 2020-04-18 ENCOUNTER — Other Ambulatory Visit: Payer: Self-pay

## 2020-04-18 ENCOUNTER — Ambulatory Visit (HOSPITAL_COMMUNITY)
Admission: EM | Admit: 2020-04-18 | Discharge: 2020-04-18 | Disposition: A | Discharge status: Home / Self Care | Payer: 59 | Attending: Emergency Medicine | Admitting: Emergency Medicine

## 2020-04-18 ENCOUNTER — Encounter (HOSPITAL_COMMUNITY): Payer: Self-pay | Admitting: Emergency Medicine

## 2020-04-18 DIAGNOSIS — R112 Nausea with vomiting, unspecified: Secondary | ICD-10-CM | POA: Diagnosis not present

## 2020-04-18 MED ORDER — ONDANSETRON 4 MG PO TBDP
ORAL_TABLET | ORAL | Status: AC
  Filled 2020-04-18: qty 1

## 2020-04-18 MED ORDER — ONDANSETRON 4 MG PO TBDP: 4.0000 mg | ORAL_TABLET | Freq: Three times a day (TID) | ORAL | 0 refills | Status: DC | PRN

## 2020-04-18 MED ORDER — ONDANSETRON 4 MG PO TBDP
4.0000 mg | ORAL_TABLET | Freq: Once | ORAL | Status: AC
  Administered 2020-04-18: 4 mg via ORAL

## 2020-04-18 NOTE — ED Triage Notes (Signed)
Pt presents today with c/o of nausea/vomiting x3 today. Denies diarrhea.

## 2020-04-18 NOTE — Discharge Instructions (Addendum)
Take the antinausea medication as directed.    Keep yourself hydrated with clear liquids, such as water, Gatorade, Pedialyte, Sprite, or ginger ale.    Go to the emergency department if you have acute worsening symptoms.    Follow up with your primary care provider if your symptoms are not improving.      

## 2020-04-18 NOTE — ED Provider Notes (Signed)
[place]    CSN: [id number] Arrival date & time: 04/18/20  1604      History   Chief Complaint Chief Complaint  Patient presents with  . Nausea  . Emesis    HPI Tyler Lester is a 75 y.o. male.   Patient presents with nausea and emesis today.  3 episodes of emesis today.  He denies fever, abdominal pain, diarrhea, constipation, or other symptoms.  Treatment attempted with Alka-Seltzer.  No one at home with similar symptoms.  Patient states he has been able to drink fluids today and feels that he is able to keep himself hydrated at home.  He declines COVID test today.  His medical history includes Parkinson's disease, chronic pain, hypertension, heart failure, CKD, diabetes.  The history is provided by the patient and medical records.    Past Medical History:  Diagnosis Date  . Bilateral foot pain   . Chronic pain   . Diabetes mellitus    type 2, diet controlled now  . Foley catheter in place since nov 2018, last changed 1 week ago  . Hyperlipidemia   . Hypertension   . Inguinal hernia    right  . Numbness    T/O  . Parkinson disease ([place]) 08/26/2015   Followed by [facility]. Last visit 06/03/2015. History of poor compliance with his medication. Referred him back to [facility] in 10/2016. They didn't want to see him back due to poor compliance with medical advise. Last plan from 06/03/2015 still valid  . Parkinson's disease ([place])   . Pneumonia yrs ago  . Shortness of breath    with exertion  . Urinary retention     Patient Active Problem List   Diagnosis Date Noted  . Acute focal neurological deficit 03/01/2020  . Ischial pain, right 08/25/2019  . Palpitations 11/24/2018  . Onychomycosis 08/05/2017  . Chronic bilateral low back pain with bilateral sciatica 01/30/2017  . Memory change 11/09/2016  . Hyperlipidemia 11/05/2016  . Diabetic polyneuropathy associated with type 2 diabetes mellitus ([place]) 04/13/2016  . 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  Diminished hearing, left 01/20/2016  . Type 2 diabetes mellitus with complication ([name]) 00/86/7619  . Gait abnormality 03/28/2015  . Postlaminectomy syndrome, cervical region 07/23/2014  . Parkinson's disease ([place]) 07/23/2014  . Back pain with sciatica 05/27/2014  . Bilateral leg pain 05/14/2014  . Depression 10/15/2013  . CKD (chronic kidney disease) stage 3, GFR 30-59 ml/min (HCC) 08/25/2013  . Tremor 05/23/2013  . Acute renal failure superimposed on stage 3 chronic kidney disease ([place]) 05/08/2013  . Chronic diastolic heart failure ([place]) 05/06/2013  . Onychogryposis of toenail 05/14/2012  . GERD (gastroesophageal reflux disease) 07/04/2011  . Essential hypertension 01/05/2011    Past Surgical History:  Procedure Laterality Date  . CATARACT EXTRACTION W/ INTRAOCULAR LENS IMPLANT Left 06/2012   laser for posterior capsule?  . COLON SURGERY    . POSTERIOR CERVICAL FUSION/FORAMINOTOMY N/A 11/12/2013   Procedure: Posterior cervical decompression fusion, cervical 3-4, cervical 4-5, cervical 5-6, cervical 6-7 with instrumentation and allograft   (LEVEL 4);  Surgeon: Sinclair Ship, MD;  Location: [facility];  Service: Orthopedics;  Laterality: N/A;  Posterior cervical decompression fusion, cervical 3-4, cervical 4-5, cervical 5-6, cervical 6-7 with instrumentation and allograft  . TONSILLECTOMY    . XI ROBOTIC ASSISTED SIMPLE PROSTATECTOMY N/A 03/07/2016   Procedure: XI ROBOTIC ASSISTED SIMPLE PROSTATECTOMY AND UMBILICAL HERNIA REPAIR flexible cystoscopy;  Surgeon: Alexis Frock, MD;  Location: [facility];  Service: Urology;  Laterality: N/A;       Home Medications    Prior to Admission medications   Medication Sig Start Date End Date Taking? Authorizing Provider  ondansetron (ZOFRAN ODT) 4 MG disintegrating tablet Take 1 tablet (4 mg total) by mouth every 8 (eight) hours as needed for nausea or vomiting. 04/18/20  Yes Sharion Balloon, NP  allopurinol (ZYLOPRIM) 300 MG tablet Take 300 mg by  mouth daily. 02/21/20   [provider]  amLODipine (NORVASC) 5 MG tablet Take 1 tablet (5 mg total) by mouth daily. 03/04/20 04/03/20  Kayleen Memos, DO  aspirin EC 81 MG EC tablet Take 1 tablet (81 mg total) by mouth daily. Swallow whole. 03/04/20 06/02/20  Kayleen Memos, DO  atorvastatin (LIPITOR) 20 MG tablet Take 20 mg by mouth daily. 02/21/20   [provider]  carbidopa-levodopa (SINEMET) 25-100 MG tablet Take 2 tablets by mouth 4 (four) times daily. 03/03/20 06/01/20  Kayleen Memos, DO  cyclobenzaprine (FLEXERIL) 5 MG tablet Take 5 mg by mouth 3 (three) times daily as needed for muscle spasms. 02/05/20   [provider]  donepezil (ARICEPT) 10 MG tablet Take 1 tablet by mouth every day 12/15/19   Wilber Oliphant, MD  furosemide (LASIX) 40 MG tablet Take 40 mg by mouth daily. 01/06/20   [provider]  gabapentin (NEURONTIN) 300 MG capsule Take 300 mg by mouth daily as needed (nerve pain). 02/05/20   [provider]  omeprazole (PRILOSEC) 20 MG capsule Take 20 mg by mouth daily. 02/22/20   [provider]  oxyCODONE-acetaminophen (PERCOCET) 7.5-325 MG tablet Take 1 tablet by mouth 4 (four) times daily as needed for moderate pain. 09/10/19   [provider]  Potassium Chloride ER 20 MEQ TBCR Take 1 tablet by mouth daily. 02/05/20   [provider]  Vitamin D, Ergocalciferol, (DRISDOL) 1.25 MG (50000 UNIT) CAPS capsule Take 50,000 Units by mouth every Tuesday. 02/04/20   [provider]    Family History Family History  Problem Relation Age of Onset  . Cancer - Other Other   . Diabetes Other   . Hypertension Other     Social History Social History   Tobacco Use  . Smoking status: Never Smoker  . Smokeless tobacco: Never Used  Vaping Use  . Vaping Use: Never used  Substance Use Topics  . Alcohol use: Yes    Alcohol/week: 0.0 standard drinks    Comment: 1 shot a day  . Drug use: No     Allergies   Poison  ivy treatments   Review of Systems Review of Systems  Constitutional: Negative for chills and fever.  HENT: Negative for ear pain and sore throat.   Eyes: Negative for pain and visual disturbance.  Respiratory: Negative for cough and shortness of breath.   Cardiovascular: Negative for chest pain and palpitations.  Gastrointestinal: Positive for nausea and vomiting. Negative for abdominal pain and diarrhea.  Genitourinary: Negative for dysuria and hematuria.  Musculoskeletal: Negative for arthralgias and back pain.  Skin: Negative for color change and rash.  Neurological: Negative for seizures and syncope.  All other systems reviewed and are negative.    Physical Exam Triage Vital Signs ED Triage Vitals  Enc Vitals Group     BP      Pulse      Resp      Temp      Temp src      SpO2  Weight      Height      Head Circumference      Peak Flow      Pain Score      Pain Loc      Pain Edu?      Excl. in [place]?    No data found.  Updated Vital Signs BP (!) 141/74 (BP Location: Right Arm)   Pulse 76   Temp 97.7 F (36.5 C) (Oral)   Resp 18   SpO2 96%   Visual Acuity Right Eye Distance:   Left Eye Distance:   Bilateral Distance:    Right Eye Near:   Left Eye Near:    Bilateral Near:     Physical Exam Vitals and nursing note reviewed.  Constitutional:      General: He is not in acute distress.    Appearance: He is well-developed and well-nourished.  HENT:     Head: Normocephalic and atraumatic.     Mouth/Throat:     Mouth: Mucous membranes are moist.  Eyes:     Conjunctiva/sclera: Conjunctivae normal.  Cardiovascular:     Rate and Rhythm: Normal rate and regular rhythm.     Heart sounds: Normal heart sounds.  Pulmonary:     Effort: Pulmonary effort is normal. No respiratory distress.     Breath sounds: Normal breath sounds.  Abdominal:     General: Bowel sounds are normal.     Palpations: Abdomen is soft.     Tenderness: There is no abdominal  tenderness. There is no guarding or rebound.  Musculoskeletal:        General: No edema.     Cervical back: Neck supple.  Skin:    General: Skin is warm and dry.  Neurological:     Mental Status: He is alert. Mental status is at baseline.  Psychiatric:        Mood and Affect: Mood and affect and mood normal.        Behavior: Behavior normal.      UC Treatments / Results  Labs (all labs ordered are listed, but only abnormal results are displayed) Labs Reviewed - No data to display  EKG   Radiology No results found.  Procedures Procedures (including critical care time)  Medications Ordered in UC Medications  ondansetron (ZOFRAN-ODT) disintegrating tablet 4 mg (4 mg Oral Given 04/18/20 1827)    Initial Impression / Assessment and Plan / UC Course  I have reviewed the triage vital signs and the nursing notes.  Pertinent labs & imaging results that were available during my care of the patient were reviewed by me and considered in my medical decision making (see chart for details).   Nausea with non-intractable vomiting. Zofran given here and patient able to take oral fluids without emesis. Treating at home with Zofran. Instructed patient to keep himself hydrated with clear liquids. Instructed him to go to the ED if he develops acute worsening symptoms. Instructed him to follow-up with his PCP as needed. He agrees to plan of care.   Final Clinical Impressions(s) / UC Diagnoses   Final diagnoses:  Non-intractable vomiting with nausea, unspecified vomiting type     Discharge Instructions     Take the antinausea medication as directed.    Keep yourself hydrated with clear liquids, such as water, Gatorade, Pedialyte, Sprite, or ginger ale.    Go to the emergency department if you have acute worsening symptoms.    Follow up with your primary care provider if your  symptoms are not improving.         ED Prescriptions    Medication Sig Dispense Auth. Provider    ondansetron (ZOFRAN ODT) 4 MG disintegrating tablet Take 1 tablet (4 mg total) by mouth every 8 (eight) hours as needed for nausea or vomiting. 20 tablet Sharion Balloon, NP     PDMP not reviewed this encounter.   Sharion Balloon, NP 04/18/20 [phone]

## 2020-06-07 ENCOUNTER — Ambulatory Visit (HOSPITAL_COMMUNITY)
Admission: EM | Admit: 2020-06-07 | Discharge: 2020-06-07 | Disposition: A | Discharge status: Home / Self Care | Payer: 59

## 2020-06-07 ENCOUNTER — Emergency Department (HOSPITAL_COMMUNITY)
Admission: EM | Admit: 2020-06-07 | Discharge: 2020-06-07 | Disposition: A | Discharge status: Home / Self Care | Payer: 59 | Attending: Emergency Medicine | Admitting: Emergency Medicine

## 2020-06-07 ENCOUNTER — Other Ambulatory Visit: Payer: Self-pay

## 2020-06-07 ENCOUNTER — Emergency Department (HOSPITAL_COMMUNITY): Payer: 59

## 2020-06-07 ENCOUNTER — Encounter (HOSPITAL_COMMUNITY): Payer: Self-pay | Admitting: Emergency Medicine

## 2020-06-07 ENCOUNTER — Encounter (HOSPITAL_COMMUNITY): Payer: Self-pay

## 2020-06-07 DIAGNOSIS — N183 Chronic kidney disease, stage 3 unspecified: Secondary | ICD-10-CM | POA: Insufficient documentation

## 2020-06-07 DIAGNOSIS — Z79899 Other long term (current) drug therapy: Secondary | ICD-10-CM | POA: Insufficient documentation

## 2020-06-07 DIAGNOSIS — G20 Parkinson's disease: Secondary | ICD-10-CM | POA: Insufficient documentation

## 2020-06-07 DIAGNOSIS — I5032 Chronic diastolic (congestive) heart failure: Secondary | ICD-10-CM | POA: Insufficient documentation

## 2020-06-07 DIAGNOSIS — E1142 Type 2 diabetes mellitus with diabetic polyneuropathy: Secondary | ICD-10-CM | POA: Diagnosis not present

## 2020-06-07 DIAGNOSIS — R531 Weakness: Secondary | ICD-10-CM | POA: Diagnosis present

## 2020-06-07 DIAGNOSIS — E860 Dehydration: Secondary | ICD-10-CM | POA: Diagnosis not present

## 2020-06-07 DIAGNOSIS — R630 Anorexia: Secondary | ICD-10-CM | POA: Diagnosis not present

## 2020-06-07 DIAGNOSIS — I130 Hypertensive heart and chronic kidney disease with heart failure and stage 1 through stage 4 chronic kidney disease, or unspecified chronic kidney disease: Secondary | ICD-10-CM | POA: Insufficient documentation

## 2020-06-07 LAB — CBC WITH DIFFERENTIAL/PLATELET
Abs Immature Granulocytes: 0.06 10*3/uL (ref 0.00–0.07)
Basophils Absolute: 0 10*3/uL (ref 0.0–0.1)
Basophils Relative: 0 %
Eosinophils Absolute: 0 10*3/uL (ref 0.0–0.5)
Eosinophils Relative: 1 %
HCT: 50.9 % (ref 39.0–52.0)
Hemoglobin: 15.8 g/dL (ref 13.0–17.0)
Immature Granulocytes: 1 %
Lymphocytes Relative: 24 %
Lymphs Abs: 1.5 10*3/uL (ref 0.7–4.0)
MCH: 27 pg (ref 26.0–34.0)
MCHC: 31 g/dL (ref 30.0–36.0)
MCV: 87 fL (ref 80.0–100.0)
Monocytes Absolute: 0.6 10*3/uL (ref 0.1–1.0)
Monocytes Relative: 10 %
Neutro Abs: 3.8 10*3/uL (ref 1.7–7.7)
Neutrophils Relative %: 64 %
Platelets: 155 10*3/uL (ref 150–400)
RBC: 5.85 MIL/uL — ABNORMAL HIGH (ref 4.22–5.81)
RDW: 13.8 % (ref 11.5–15.5)
WBC: 6 10*3/uL (ref 4.0–10.5)
nRBC: 0 % (ref 0.0–0.2)

## 2020-06-07 LAB — COMPREHENSIVE METABOLIC PANEL
ALT: <5 U/L (ref 0–44)
AST: 10 U/L — ABNORMAL LOW (ref 15–41)
Albumin: 3.6 g/dL (ref 3.5–5.0)
Alkaline Phosphatase: 46 U/L (ref 38–126)
Anion gap: 7 (ref 5–15)
BUN: 32 mg/dL — ABNORMAL HIGH (ref 8–23)
CO2: 23 mmol/L (ref 22–32)
Calcium: 9.1 mg/dL (ref 8.9–10.3)
Chloride: 104 mmol/L (ref 98–111)
Creatinine, Ser: 2.33 mg/dL — ABNORMAL HIGH (ref 0.61–1.24)
GFR, Estimated: 29 mL/min — ABNORMAL LOW (ref >60)
Glucose, Bld: 93 mg/dL (ref 70–99)
Potassium: 5 mmol/L (ref 3.5–5.1)
Sodium: 134 mmol/L — ABNORMAL LOW (ref 135–145)
Total Bilirubin: 1 mg/dL (ref 0.3–1.2)
Total Protein: 7.3 g/dL (ref 6.5–8.1)

## 2020-06-07 LAB — LIPASE, BLOOD: Lipase: 66 U/L — ABNORMAL HIGH (ref 11–51)

## 2020-06-07 MED ORDER — SODIUM CHLORIDE 0.9 % IV BOLUS
500.0000 mL | Freq: Once | INTRAVENOUS | Status: AC
  Administered 2020-06-07: 500 mL via INTRAVENOUS

## 2020-06-07 MED ORDER — CARBIDOPA-LEVODOPA 25-100 MG PO TABS
2.0000 | ORAL_TABLET | Freq: Three times a day (TID) | ORAL | Status: DC
  Filled 2020-06-07: qty 2

## 2020-06-07 MED ORDER — SODIUM CHLORIDE 0.9 % IV BOLUS: 1000.0000 mL | Freq: Once | INTRAVENOUS | Status: DC

## 2020-06-07 NOTE — ED Notes (Signed)
Patient is being discharged from the Urgent Care and sent to the Emergency Department via POV .Per [place], [place], patient is in need of higher level of care due to presentation, alterred loc. Patient is aware and verbalizes understanding of plan of care.  Vitals:   06/07/20 1615  BP: 120/72  Pulse: 88  Resp: (!) 24  Temp: 98.6 F (37 C)  SpO2: 100%

## 2020-06-07 NOTE — ED Triage Notes (Signed)
Emergency Medicine Provider Triage Evaluation Note  Tyler Lester , a 75 y.o. male  was evaluated in triage.  Pt complains of n/v, decreased appetite, confusion. Pt states he has been feeling poorly for a week. Decreased PO intake. This is due to a new med his neurologist rx'd. Per UC triage note, pt's family concerned about confusion.   Review of Systems  Positive: Confusion, n/v, decreased apptetite Negative: Fever, cp, sob  Physical Exam  BP 113/70 (BP Location: Right Arm)   Pulse 92   Temp 98.6 F (37 C) (Oral)   Resp 15   SpO2 98%  Gen:   Awake, no distress   HEENT:  Atraumatic  Resp:  Normal effort  Cardiac:  Normal rate  Abd:   Nondistended, nontender  MSK:   Moves extremities without difficulty  Neuro:  Speech clear   Medical Decision Making  Medically screening exam initiated at 5:15 PM.  Appropriate orders placed.  Glenetta Borg was informed that the remainder of the evaluation will be completed by another provider, this initial triage assessment does not replace that evaluation, and the importance of remaining in the ED until their evaluation is complete.  Clinical Impression   Pt here for ams, n/v, and decreased PO. Per emr, Sinemet dose lowered, amantadine started. Labs, ct head, ua, and ekg ordered   Franchot Heidelberg, PA-C 06/07/20 1725

## 2020-06-07 NOTE — ED Provider Notes (Signed)
[facility] EMERGENCY DEPARTMENT Provider Note   CSN: [id number] Arrival date & time: 06/07/20  1627     History Chief Complaint  Patient presents with  . Weakness    Tyler Lester is a 75 y.o. male.  The history is provided by the patient, medical records and a caregiver.   Tyler Lester is a 75 y.o. male who presents to the Emergency Department complaining of weakness. History is provided by the patient and his friend. He has a history of Parkinson's disease. Last Wednesday his sentiment was changed from two tablets Q ID to one tablet Q ID. Since the medication change he reports poor appetite and does not want to eat. He reports early safety. He denies any fevers, chest pain, abdominal pain, nausea, vomiting, diarrhea, dysuria, change in urine quality. His friend attempted to call the neurologist's office today with no answer so he presents the emergency department for further evaluation.     Past Medical History:  Diagnosis Date  . Bilateral foot pain   . Chronic pain   . Diabetes mellitus    type 2, diet controlled now  . Foley catheter in place since nov 2018, last changed 1 week ago  . Hyperlipidemia   . Hypertension   . Inguinal hernia    right  . Numbness    T/O  . Parkinson disease ([place]) 08/26/2015   Follow[facility] by [facility]. Last visit 06/03/2015. History of poor compliance with his medication. Referred him back to [facility] in 10/2016. They didn't want to see him back due to poor compliance with medical advise. Last plan from 06/03/2015 still valid  . Parkinson's disease ([place])   . Pneumonia yrs ago  . Shortness of breath    with exertion  . Urinary retention     Patient Active Problem List   Diagnosis Date Noted  . Acute focal neurological deficit 03/01/2020  . Ischial pain, right 08/25/2019  . Palpitations 11/24/2018  . Onychomycosis 08/05/2017  . Chronic bilateral low back pain with bilateral sciatica 01/30/2017  . Memory change 11/09/2016  .  Hyperlipidemia 11/05/2016  . Diabetic polyneuropathy associated with type 2 diabetes mellitus ([place]) 04/13/2016  . 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 Diminished hearing, left 01/20/2016  . Type 2 diabetes mellitus with complication ([place]) 53/66/4403  . Gait abnormality 03/28/2015  . Postlaminectomy syndrome, cervical region 07/23/2014  . Parkinson's disease ([place]) 07/23/2014  . Back pain with sciatica 05/27/2014  . Bilateral leg pain 05/14/2014  . Depression 10/15/2013  . CKD (chronic kidney disease) stage 3, GFR 30-59 ml/min (HCC) 08/25/2013  . Tremor 05/23/2013  . Acute renal failure superimposed on stage 3 chronic kidney disease ([place]) 05/08/2013  . Chronic diastolic heart failure ([place]) 05/06/2013  . Onychogryposis of toenail 05/14/2012  . GERD (gastroesophageal reflux disease) 07/04/2011  . Essential hypertension 01/05/2011    Past Surgical History:  Procedure Laterality Date  . CATARACT EXTRACTION W/ INTRAOCULAR LENS IMPLANT Left 06/2012   laser for posterior capsule?  . COLON SURGERY    . POSTERIOR CERVICAL FUSION/FORAMINOTOMY N/A 11/12/2013   Procedure: Posterior cervical decompression fusion, cervical 3-4, cervical 4-5, cervical 5-6, cervical 6-7 with instrumentation and allograft   (LEVEL 4);  Surgeon: Sinclair Ship, MD;  Location: [facility];  Service: Orthopedics;  Laterality: N/A;  Posterior cervical decompression fusion, cervical 3-4, cervical 4-5, cervical 5-6, cervical 6-7 with instrumentation and allograft  . TONSILLECTOMY    . XI ROBOTIC ASSISTED SIMPLE PROSTATECTOMY N/A 03/07/2016  Procedure: XI ROBOTIC ASSISTED SIMPLE PROSTATECTOMY AND UMBILICAL HERNIA REPAIR flexible cystoscopy;  Surgeon: Alexis Frock, MD;  Location: [facility];  Service: Urology;  Laterality: N/A;       Family History  Problem Relation Age of Onset  . Cancer - Other Other   . Diabetes Other   . Hypertension Other     Social History   Tobacco Use  . Smoking status: Never  Smoker  . Smokeless tobacco: Never Used  Vaping Use  . Vaping Use: Never used  Substance Use Topics  . Alcohol use: Yes    Alcohol/week: 0.0 standard drinks    Comment: 1 shot a day  . Drug use: No    Home Medications Prior to Admission medications   Medication Sig Start Date End Date Taking? Authorizing Provider  allopurinol (ZYLOPRIM) 300 MG tablet Take 300 mg by mouth daily. 02/21/20   [provider]  amLODipine (NORVASC) 5 MG tablet Take 1 tablet (5 mg total) by mouth daily. 03/04/20 04/03/20  Kayleen Memos, DO  atorvastatin (LIPITOR) 20 MG tablet Take 20 mg by mouth daily. 02/21/20   [provider]  carbidopa-levodopa (SINEMET CR) 50-200 MG tablet Take by mouth. 06/01/20   [provider]  carbidopa-levodopa (SINEMET) 25-100 MG tablet Take 2 tablets by mouth 4 (four) times daily. 03/03/20 06/01/20  Kayleen Memos, DO  cyclobenzaprine (FLEXERIL) 5 MG tablet Take 5 mg by mouth 3 (three) times daily as needed for muscle spasms. 02/05/20   [provider]  donepezil (ARICEPT) 10 MG tablet Take 1 tablet by mouth every day 12/15/19   Wilber Oliphant, MD  furosemide (LASIX) 40 MG tablet Take 40 mg by mouth daily. 01/06/20   [provider]  gabapentin (NEURONTIN) 300 MG capsule Take 300 mg by mouth daily as needed (nerve pain). 02/05/20   [provider]  omeprazole (PRILOSEC) 20 MG capsule Take 20 mg by mouth daily. 02/22/20   [provider]  ondansetron (ZOFRAN ODT) 4 MG disintegrating tablet Take 1 tablet (4 mg total) by mouth every 8 (eight) hours as needed for nausea or vomiting. 04/18/20   Sharion Balloon, NP  oxyCODONE-acetaminophen (PERCOCET) 7.5-325 MG tablet Take 1 tablet by mouth 4 (four) times daily as needed for moderate pain. 09/10/19   [provider]  Potassium Chloride ER 20 MEQ TBCR Take 1 tablet by mouth daily. 02/05/20   [provider]  Vitamin D, Ergocalciferol, (DRISDOL) 1.25 MG (50000 UNIT) CAPS  capsule Take 50,000 Units by mouth every Tuesday. 02/04/20   [provider]    Allergies    Poison ivy treatments  Review of Systems   Review of Systems  All other systems reviewed and are negative.   Physical Exam Updated Vital Signs BP 134/88   Pulse 77   Temp 97.6 F (36.4 C) (Oral)   Resp 14   SpO2 100%   Physical Exam Vitals and nursing note reviewed.  Constitutional:      Appearance: He is well-developed.  HENT:     Head: Normocephalic and atraumatic.  Cardiovascular:     Rate and Rhythm: Normal rate and regular rhythm.     Heart sounds: No murmur heard.   Pulmonary:     Effort: Pulmonary effort is normal. No respiratory distress.     Breath sounds: Normal breath sounds.  Abdominal:     Palpations: Abdomen is soft.     Tenderness: There is no abdominal tenderness. There is no guarding or rebound.  Musculoskeletal:        General: No tenderness.  Skin:    General: Skin is warm and dry.  Neurological:     Mental Status: He is alert and oriented to person, place, and time.     Comments: Resting tremors to bilateral upper extremities. Five out of five strength in all four extremities  Psychiatric:        Behavior: Behavior normal.     ED Results / Procedures / Treatments   Labs (all labs ordered are listed, but only abnormal results are displayed) Labs Reviewed  CBC WITH DIFFERENTIAL/PLATELET - Abnormal; Notable for the following components:      Result Value   RBC 5.85 (*)    All other components within normal limits  COMPREHENSIVE METABOLIC PANEL - Abnormal; Notable for the following components:   Sodium 134 (*)    BUN 32 (*)    Creatinine, Ser 2.33 (*)    AST 10 (*)    GFR, Estimated 29 (*)    All other components within normal limits  LIPASE, BLOOD - Abnormal; Notable for the following components:   Lipase 66 (*)    All other components within normal limits    EKG None  Radiology DG Chest 2 View  Result Date:  06/07/2020 CLINICAL DATA:  Weakness, altered mental status. EXAM: CHEST - 2 VIEW COMPARISON:  March 01, 2020. FINDINGS: The heart size and mediastinal contours are within normal limits. Both lungs are clear. The visualized skeletal structures are unremarkable. IMPRESSION: No active cardiopulmonary disease. Electronically Signed   By: Marijo Conception M.D.   On: 06/07/2020 17:56   CT Head Wo Contrast  Result Date: 06/07/2020 CLINICAL DATA:  Altered mental status. EXAM: CT HEAD WITHOUT CONTRAST TECHNIQUE: Contiguous axial images were obtained from the base of the skull through the vertex without intravenous contrast. COMPARISON:  March 01, 2020 FINDINGS: Brain: There is mild cerebral atrophy with widening of the extra-axial spaces and ventricular dilatation. There are areas of decreased attenuation within the white matter tracts of the supratentorial brain, consistent with microvascular disease changes. Vascular: No hyperdense vessel or unexpected calcification. Skull: Normal. Negative for fracture or focal lesion. Sinuses/Orbits: No acute finding. Other: None. IMPRESSION: 1. Generalized cerebral atrophy. 2. No acute intracranial abnormality. Electronically Signed   By: Virgina Norfolk M.D.   On: 06/07/2020 18:53    Procedures Procedures   Medications Ordered in ED Medications  carbidopa-levodopa (SINEMET IR) 25-100 MG per tablet immediate release 2 tablet (has no administration in time range)  sodium chloride 0.9 % bolus 500 mL (0 mLs Intravenous Stopped 06/07/20 2239)    ED Course  I have reviewed the triage vital signs and the nursing notes.  Pertinent labs & imaging results that were available during my care of the patient were reviewed by me and considered in my medical decision making (see chart for details).    MDM Rules/Calculators/A&P                         patient with history of Parkinson's here for evaluation of poor appetite. On MSE evaluation he reported nausea vomiting. On  evaluation in the room patient denies nausea and vomiting but states he just gets full very easily. He is non-toxic appearing on evaluation and in no acute distress. Labs with renal insufficiency, similar when compared to priors. He has no focal neurologic deficits. He is requesting to eat in the emergency department. Will provide with a  sandwich. Will treat with gentle IV fluid hydration due to poor oral intake for the last few days. Discussed with patient and caregiver findings of studies. Recommend contacting his neurologist regarding future adjustments to his sinemet. Will provide a dose of the emergency department due to missed doses earlier today. Return precautions discussed.  Final Clinical Impression(s) / ED Diagnoses Final diagnoses:  Poor appetite  Dehydration    Rx / DC Orders ED Discharge Orders    None       Quintella Reichert, MD 06/07/20 2323

## 2020-06-07 NOTE — ED Triage Notes (Signed)
PATIENT HAS NOT BEEN ACTING HIMSELF.  PATIENT MENTAL STATUS CHANGES SINCE THE WEEKEND.  PATIENT IS NOT RESPONDING AS USUAL, NO EATING AS USUAL.  PATIENT HAS PARKINSON DISEASE.  PATIENT CAREGIVER REPORTS PATIENTS DOCTOR CHANGED MEDICINES LAST Wednesday, THEN CHANGES OCCURRED OVER THE WEEKEND.  AND NO IMPROVEMENT OVER THIS WEEK

## 2020-06-07 NOTE — ED Triage Notes (Signed)
Pt states he has been feeling weak, has not been himself, and has not had an appetite which is unusual for him. Symptoms started a week ago.

## 2020-06-24 ENCOUNTER — Telehealth: Payer: Self-pay

## 2020-06-24 NOTE — Telephone Encounter (Signed)
Spoke with patient's caregiver [name] and scheduled an in-person Palliative Consult for 07/18/20 @ 12:30PM  COVID screening was negative. No pets in home. Patient lives alone, but has a caregiver.  Consent obtained; updated Outlook/Netsmart/Team List and Epic.  Family is aware they may be receiving a call from NP the day before or day of to confirm appointment.

## 2020-07-18 ENCOUNTER — Other Ambulatory Visit: Payer: 59 | Admitting: Student

## 2020-07-18 ENCOUNTER — Other Ambulatory Visit: Payer: Self-pay

## 2020-07-18 DIAGNOSIS — G20 Parkinson's disease: Secondary | ICD-10-CM

## 2020-07-18 DIAGNOSIS — Z515 Encounter for palliative care: Secondary | ICD-10-CM

## 2020-07-18 DIAGNOSIS — R52 Pain, unspecified: Secondary | ICD-10-CM

## 2020-07-18 NOTE — Progress Notes (Signed)
[facility] Consult Note Telephone: [phone]  Fax: [phone]    Date of encounter: 07/18/20 PATIENT NAME: Tyler Lester [address] [place] [address]   [phone] (home)  DOB: [birth year] MRN: [id number] PRIMARY CARE PROVIDER:    Wilber Oliphant, MD,  [place]. [place] [address] [phone]  REFERRING PROVIDER:   Andy Gauss, NP-[facility]  RESPONSIBLE PARTY:    Contact Information    Name Relation Home Work Mobile   Ford,Felicia Denman George   [phone]       I met face to face with patient and caregiver in the home. Palliative Care was asked to follow this patient by consultation request of Andy Gauss, NP  to address advance care planning and complex medical decision making. This is the initial visit.                                     ASSESSMENT AND PLAN / RECOMMENDATIONS:   Advance Care Planning/Goals of Care: Goals include to maximize quality of life and symptom management. Our advance care planning conversation included a discussion about:     The value and importance of advance care planning   Experiences with loved ones who have been seriously ill or have died   Exploration of personal, cultural or spiritual beliefs that might influence medical decisions   Exploration of goals of care in the event of a sudden injury or illness   Identification and preparation of a healthcare agent-patient would like to designate HCPOA  MOST form introduced; patient would like to review further  CODE STATUS: DNR  Symptom Management/Plan:  Parkinson's disease-patient requires assistance with adl's. Continue walker for ambulation. Monitor for falls/safety. Continue carbidopa-levodopa as directed. Follow up with Neurology as scheduled.   Recommend hospital bed due to his Parkinson's disease diastolic heart failure and chronic pain. Tyler Lester requires frequent changes in body position. He  requires positioning of the body in ways not feasible with an ordinary bed to alleviate pain and requires head of bed be elevated more than 30 degrees most of the time due to heart failure and risk for aspiration.   Pain-patient with chronic pain, arthropathy, neuropathy. Continue routine gabapentin, prn oxycodone-acetaminophen, prn cyclobenzaprine as directed.    Follow up Palliative Care Visit: Palliative care will continue to follow for complex medical decision making, advance care planning, and clarification of goals. Return in 8 weeks or prn.  I spent 60 minutes providing this consultation. More than 50% of the time in this consultation was spent in counseling and care coordination.    PPS: 50%  HOSPICE ELIGIBILITY/DIAGNOSIS: TBD  Chief Complaint: Palliative Medicine Initial visit; Parkinson's disease.  HISTORY OF PRESENT ILLNESS:  ELIGIO Lester is a 75 y.o. year old male  with Parkinson's disease, diastolic heart failure, chronic pain, arthropathy, PVD, stasis dermatitis, neuropathy, anxiety, T2DM.  Mr. Tyler Lester resides at home; has in home caregiver 7 days a week. He reports pain to lower back, radiates down his legs. He states current regimen is effective. He does reports occasional shortness of breath at rest. He does require assistance with adl's. No recent falls or injury. States he is sleeping well at night. Last ER visit 06/07/20 due to decline in appetite; patient and caregiver state he is now eating better. Good appetite endorsed. Last hemoglobin A1C 6.1; not receiving any medications for diabetes.  History obtained from review of EMR, discussion with primary team, and interview with family, facility staff/caregiver and/or Mr. Tibbitts.  I reviewed available labs, medications, imaging, studies and related documents from the EMR.  Records reviewed and summarized above.   ROS  General: NAD EYES: denies vision changes ENMT: denies dysphagia Cardiovascular: denies chest  pain, occasional shortness of breath Pulmonary: denies cough Abdomen: endorses good appetite, denies constipation GU: denies dysuria, incontinence of urine MSK: weakness, no falls reported Skin: denies rashes or wounds Neurological: denies insomnia Psych: Endorses positive mood Heme/lymph/immuno: denies bruises, abnormal bleeding  Physical Exam: Pulse 84, resp 20, b/p 130/80, sats 98% on room air Constitutional: NAD General: frail appearing, thin EYES: anicteric sclera, lids intact, no discharge  ENMT: intact hearing, oral mucous membranes moist, dentition intact CV: S1S2, RRR, no LE edema Pulmonary: LCTA, no increased work of breathing, no cough Abdomen: normo-active BS + 4 quadrants, soft and non tender GU: deferred MSK:  moves all extremities, ambulatory with walker Skin: warm and dry, no rashes or wounds on visible skin Neuro: generalized weakness, tremor Psych: non-anxious affect, A & O x 3 Hem/lymph/immuno: no widespread bruising   CURRENT PROBLEM LIST:  Patient Active Problem List   Diagnosis Date Noted  . Acute focal neurological deficit 03/01/2020  . Ischial pain, right 08/25/2019  . Palpitations 11/24/2018  . Onychomycosis 08/05/2017  . Chronic bilateral low back pain with bilateral sciatica 01/30/2017  . Memory change 11/09/2016  . Hyperlipidemia 11/05/2016  . Diabetic polyneuropathy associated with type 2 diabetes mellitus ([place]) 04/13/2016  . 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 Diminished hearing, left 01/20/2016  . Type 2 diabetes mellitus with complication ([place]) 34/19/6222  . Gait abnormality 03/28/2015  . Postlaminectomy syndrome, cervical region 07/23/2014  . Parkinson's disease ([place]) 07/23/2014  . Back pain with sciatica 05/27/2014  . Bilateral leg pain 05/14/2014  . Depression 10/15/2013  . CKD (chronic kidney disease) stage 3, GFR 30-59 ml/min (HCC) 08/25/2013  . Tremor 05/23/2013  . Acute renal failure superimposed on stage 3  chronic kidney disease ([place]) 05/08/2013  . Chronic diastolic heart failure ([place]) 05/06/2013  . Onychogryposis of toenail 05/14/2012  . GERD (gastroesophageal reflux disease) 07/04/2011  . Essential hypertension 01/05/2011   PAST MEDICAL HISTORY:  Active Ambulatory Problems    Diagnosis Date Noted  . Essential hypertension 01/05/2011  . GERD (gastroesophageal reflux disease) 07/04/2011  . Onychogryposis of toenail 05/14/2012  . Chronic diastolic heart failure ([place]) 05/06/2013  . Acute renal failure superimposed on stage 3 chronic kidney disease ([place]) 05/08/2013  . Tremor 05/23/2013  . CKD (chronic kidney disease) stage 3, GFR 30-59 ml/min (HCC) 08/25/2013  . Depression 10/15/2013  . Bilateral leg pain 05/14/2014  . Back pain with sciatica 05/27/2014  . Postlaminectomy syndrome, cervical region 07/23/2014  . Parkinson's disease ([place]) 07/23/2014  . Gait abnormality 03/28/2015  . Type 2 diabetes mellitus with complication ([place]) 97/98/9211  . Diminished hearing, left 01/20/2016  . 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 Diabetic polyneuropathy associated with type 2 diabetes mellitus ([place]) 04/13/2016  . Hyperlipidemia 11/05/2016  . Memory change 11/09/2016  . Chronic bilateral low back pain with bilateral sciatica 01/30/2017  . Onychomycosis 08/05/2017  . Palpitations 11/24/2018  . Ischial pain, right 08/25/2019  . Acute focal neurological deficit 03/01/2020   Resolved Ambulatory Problems    Diagnosis Date Noted  . Prediabetes 01/05/2011  . Chronic pain 01/05/2011  . Anxiety 01/05/2011  . Skin lesion of lower extremity 01/26/2011  .  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 URI (upper respiratory infection) 05/17/2011  . Otitis externa 05/17/2011  . Fatigue 07/05/2011  . Preseptal cellulitis 10/23/2011  . Diarrhea 10/23/2011  . Hearing loss 12/18/2011  . Right hip pain 12/18/2011  . Community acquired pneumonia 02/09/2012  . Urinary retention 02/09/2012  . Weight loss  02/09/2012  . Court decision 02/09/2012  . Cough 03/27/2012  . Shoulder pain 03/27/2012  . Neck pain on left side 04/25/2012  . Bilateral lower extremity edema 05/14/2012  . Neck pain on right side 07/14/2012  . Knee pain, bilateral 12/12/2012  . Gait instability 02/21/2013  . Acute upper respiratory infections of unspecified site 04/30/2013  . Encounter for chronic pain management 07/13/2013  . Soft tissue mass 08/03/2013  . Myelopathy ([place]) 11/12/2013  . Tremor of left hand 01/05/2014  . Hearing loss of left ear due to cerumen impaction 04/27/2014  . Loss of weight 05/27/2014  . 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 Stool incontinence 10/08/2014  . Cough 10/08/2014  . Left arm swelling 11/11/2014  . Hypokalemia 11/11/2014  . Shortness of breath 03/28/2015  . Dyspnea 06/22/2015  . Rhinitis, allergic 08/25/2015  . Parkinson disease ([place]) 08/26/2015  . Chronic back pain 09/16/2015  . Routine adult health maintenance 06/18/2016  . Leg swelling 07/27/2016  . PAD (peripheral artery disease) ([place]) 07/28/2016  . Enrolled in chronic care management 10/02/2016  . Coordination of complex care 10/04/2016  . Care plan discussed with patient 06/27/2017  . Carpal tunnel syndrome of left wrist 11/06/2016  . Cubital tunnel syndrome on left 11/06/2016  . Hypotension 02/09/2019   Past Medical History:  Diagnosis Date  . Bilateral foot pain   . Diabetes mellitus   . Foley catheter in place since nov 2018, last changed 1 week ago  . Hypertension   . Inguinal hernia   . Numbness   . Pneumonia yrs ago   SOCIAL HX:  Social History   Tobacco Use  . Smoking status: Never Smoker  . Smokeless tobacco: Never Used  Substance Use Topics  . Alcohol use: Yes    Alcohol/week: 0.0 standard drinks    Comment: 1 shot a day   FAMILY HX:  Family History  Problem Relation Age of Onset  . Cancer - Other Other   . Diabetes Other   . Hypertension Other      ALLERGIES:   Allergies  Allergen Reactions  . Poison Ivy Treatments Hives    Pt denies allergy to this medication     PERTINENT MEDICATIONS:  Outpatient Encounter Medications as of 07/18/2020  Medication Sig  . allopurinol (ZYLOPRIM) 300 MG tablet Take 300 mg by mouth daily.  Marland Kitchen amLODipine (NORVASC) 5 MG tablet Take 1 tablet (5 mg total) by mouth daily.  Marland Kitchen atorvastatin (LIPITOR) 20 MG tablet Take 20 mg by mouth daily.  . carbidopa-levodopa (SINEMET CR) 50-200 MG tablet Take by mouth.  . carbidopa-levodopa (SINEMET) 25-100 MG tablet Take 2 tablets by mouth 4 (four) times daily.  . cyclobenzaprine (FLEXERIL) 5 MG tablet Take 5 mg by mouth 3 (three) times daily as needed for muscle spasms.  Marland Kitchen donepezil (ARICEPT) 10 MG tablet Take 1 tablet by mouth every day  . furosemide (LASIX) 40 MG tablet Take 40 mg by mouth daily.  Marland Kitchen gabapentin (NEURONTIN) 300 MG capsule Take 300 mg by mouth daily as needed (nerve pain).  Marland Kitchen omeprazole (PRILOSEC) 20 MG capsule Take 20 mg by mouth daily.  . ondansetron (ZOFRAN ODT) 4 MG disintegrating  tablet Take 1 tablet (4 mg total) by mouth every 8 (eight) hours as needed for nausea or vomiting.  Marland Kitchen oxyCODONE-acetaminophen (PERCOCET) 7.5-325 MG tablet Take 1 tablet by mouth 4 (four) times daily as needed for moderate pain.  Marland Kitchen Potassium Chloride ER 20 MEQ TBCR Take 1 tablet by mouth daily.  . Vitamin D, Ergocalciferol, (DRISDOL) 1.25 MG (50000 UNIT) CAPS capsule Take 50,000 Units by mouth every Tuesday.   No facility-administered encounter medications on file as of 07/18/2020.    Thank you for the opportunity to participate in the care of Mr. Hollars.  The palliative care team will continue to follow. Please call our office at [phone] if we can be of additional assistance.   Ezekiel Slocumb, NP   COVID-19 PATIENT SCREENING TOOL Asked and negative response unless otherwise noted:   Have you had symptoms of covid, tested positive or been in contact with someone with  symptoms/positive test in the past 5-10 days? No

## 2020-08-09 ENCOUNTER — Other Ambulatory Visit: Payer: 59 | Admitting: Internal Medicine

## 2020-08-22 ENCOUNTER — Telehealth: Payer: Self-pay | Admitting: Student

## 2020-08-22 NOTE — Telephone Encounter (Signed)
Palliative NP returned call to Bethann Punches with [facility] [phone]  for patient. Discussed current plan of care. She states to notify her if patient is in need of any additional support needs in the home.

## 2020-10-13 ENCOUNTER — Inpatient Hospital Stay (HOSPITAL_COMMUNITY)
Admission: EM | Admit: 2020-10-13 | Discharge: 2020-10-15 | DRG: 684 | Disposition: A | Discharge status: Home / Self Care | Payer: 59 | Attending: Family Medicine | Admitting: Family Medicine

## 2020-10-13 ENCOUNTER — Emergency Department (HOSPITAL_COMMUNITY): Payer: 59

## 2020-10-13 DIAGNOSIS — N1832 Chronic kidney disease, stage 3b: Secondary | ICD-10-CM | POA: Diagnosis present

## 2020-10-13 DIAGNOSIS — N179 Acute kidney failure, unspecified: Secondary | ICD-10-CM | POA: Diagnosis present

## 2020-10-13 DIAGNOSIS — E1122 Type 2 diabetes mellitus with diabetic chronic kidney disease: Secondary | ICD-10-CM | POA: Diagnosis present

## 2020-10-13 DIAGNOSIS — E875 Hyperkalemia: Secondary | ICD-10-CM | POA: Diagnosis present

## 2020-10-13 DIAGNOSIS — Z20822 Contact with and (suspected) exposure to covid-19: Secondary | ICD-10-CM | POA: Diagnosis present

## 2020-10-13 DIAGNOSIS — I129 Hypertensive chronic kidney disease with stage 1 through stage 4 chronic kidney disease, or unspecified chronic kidney disease: Secondary | ICD-10-CM | POA: Diagnosis present

## 2020-10-13 DIAGNOSIS — I959 Hypotension, unspecified: Secondary | ICD-10-CM | POA: Diagnosis present

## 2020-10-13 DIAGNOSIS — Z79899 Other long term (current) drug therapy: Secondary | ICD-10-CM | POA: Diagnosis not present

## 2020-10-13 DIAGNOSIS — I10 Essential (primary) hypertension: Secondary | ICD-10-CM | POA: Diagnosis present

## 2020-10-13 DIAGNOSIS — N183 Chronic kidney disease, stage 3 unspecified: Secondary | ICD-10-CM | POA: Diagnosis present

## 2020-10-13 DIAGNOSIS — R55 Syncope and collapse: Secondary | ICD-10-CM | POA: Diagnosis present

## 2020-10-13 DIAGNOSIS — G8929 Other chronic pain: Secondary | ICD-10-CM | POA: Diagnosis present

## 2020-10-13 DIAGNOSIS — Z8249 Family history of ischemic heart disease and other diseases of the circulatory system: Secondary | ICD-10-CM

## 2020-10-13 DIAGNOSIS — Z66 Do not resuscitate: Secondary | ICD-10-CM | POA: Diagnosis present

## 2020-10-13 DIAGNOSIS — G20 Parkinson's disease: Secondary | ICD-10-CM | POA: Diagnosis present

## 2020-10-13 DIAGNOSIS — E785 Hyperlipidemia, unspecified: Secondary | ICD-10-CM | POA: Diagnosis present

## 2020-10-13 DIAGNOSIS — K219 Gastro-esophageal reflux disease without esophagitis: Secondary | ICD-10-CM | POA: Diagnosis present

## 2020-10-13 DIAGNOSIS — G20A1 Parkinson's disease without dyskinesia, without mention of fluctuations: Secondary | ICD-10-CM | POA: Diagnosis present

## 2020-10-13 DIAGNOSIS — Z833 Family history of diabetes mellitus: Secondary | ICD-10-CM

## 2020-10-13 DIAGNOSIS — N400 Enlarged prostate without lower urinary tract symptoms: Secondary | ICD-10-CM | POA: Diagnosis present

## 2020-10-13 DIAGNOSIS — E860 Dehydration: Secondary | ICD-10-CM | POA: Diagnosis present

## 2020-10-13 LAB — COMPREHENSIVE METABOLIC PANEL
ALT: <5 U/L (ref 0–44)
AST: 13 U/L — ABNORMAL LOW (ref 15–41)
Albumin: 3.3 g/dL — ABNORMAL LOW (ref 3.5–5.0)
Alkaline Phosphatase: 47 U/L (ref 38–126)
Anion gap: 13 (ref 5–15)
BUN: 53 mg/dL — ABNORMAL HIGH (ref 8–23)
CO2: 21 mmol/L — ABNORMAL LOW (ref 22–32)
Calcium: 9.4 mg/dL (ref 8.9–10.3)
Chloride: 102 mmol/L (ref 98–111)
Creatinine, Ser: 4.46 mg/dL — ABNORMAL HIGH (ref 0.61–1.24)
GFR, Estimated: 13 mL/min — ABNORMAL LOW (ref >60)
Glucose, Bld: 124 mg/dL — ABNORMAL HIGH (ref 70–99)
Potassium: 5.8 mmol/L — ABNORMAL HIGH (ref 3.5–5.1)
Sodium: 136 mmol/L (ref 135–145)
Total Bilirubin: 0.7 mg/dL (ref 0.3–1.2)
Total Protein: 7.4 g/dL (ref 6.5–8.1)

## 2020-10-13 LAB — RESP PANEL BY RT-PCR (FLU A&B, COVID) ARPGX2
Influenza A by PCR: NEGATIVE
Influenza B by PCR: NEGATIVE
SARS Coronavirus 2 by RT PCR: NEGATIVE

## 2020-10-13 LAB — CBC WITH DIFFERENTIAL/PLATELET
Abs Immature Granulocytes: 0.06 10*3/uL (ref 0.00–0.07)
Basophils Absolute: 0 10*3/uL (ref 0.0–0.1)
Basophils Relative: 0 %
Eosinophils Absolute: 0.1 10*3/uL (ref 0.0–0.5)
Eosinophils Relative: 1 %
HCT: 47.8 % (ref 39.0–52.0)
Hemoglobin: 14.8 g/dL (ref 13.0–17.0)
Immature Granulocytes: 1 %
Lymphocytes Relative: 15 %
Lymphs Abs: 1.4 10*3/uL (ref 0.7–4.0)
MCH: 29 pg (ref 26.0–34.0)
MCHC: 31 g/dL (ref 30.0–36.0)
MCV: 93.5 fL (ref 80.0–100.0)
Monocytes Absolute: 0.9 10*3/uL (ref 0.1–1.0)
Monocytes Relative: 10 %
Neutro Abs: 7.2 10*3/uL (ref 1.7–7.7)
Neutrophils Relative %: 73 %
Platelets: 185 10*3/uL (ref 150–400)
RBC: 5.11 MIL/uL (ref 4.22–5.81)
RDW: 15 % (ref 11.5–15.5)
WBC: 9.7 10*3/uL (ref 4.0–10.5)
nRBC: 0 % (ref 0.0–0.2)

## 2020-10-13 MED ORDER — ONDANSETRON HCL 4 MG/2ML IJ SOLN: 4.0000 mg | Freq: Four times a day (QID) | INTRAMUSCULAR | Status: DC | PRN

## 2020-10-13 MED ORDER — SODIUM CHLORIDE 0.9 % IV BOLUS
1000.0000 mL | Freq: Once | INTRAVENOUS | Status: AC
  Administered 2020-10-13: 1000 mL via INTRAVENOUS

## 2020-10-13 MED ORDER — HEPARIN SODIUM (PORCINE) 5000 UNIT/ML IJ SOLN
5000.0000 [IU] | Freq: Three times a day (TID) | INTRAMUSCULAR | Status: DC
  Administered 2020-10-14 – 2020-10-15 (×4): 5000 [IU] via SUBCUTANEOUS
  Filled 2020-10-13 (×5): qty 1

## 2020-10-13 MED ORDER — ACETAMINOPHEN 650 MG RE SUPP: 650.0000 mg | Freq: Four times a day (QID) | RECTAL | Status: DC | PRN

## 2020-10-13 MED ORDER — LACTATED RINGERS IV SOLN: INTRAVENOUS | Status: DC

## 2020-10-13 MED ORDER — ONDANSETRON HCL 4 MG PO TABS: 4.0000 mg | ORAL_TABLET | Freq: Four times a day (QID) | ORAL | Status: DC | PRN

## 2020-10-13 MED ORDER — ACETAMINOPHEN 325 MG PO TABS: 650.0000 mg | ORAL_TABLET | Freq: Four times a day (QID) | ORAL | Status: DC | PRN

## 2020-10-13 NOTE — ED Provider Notes (Signed)
[facility] EMERGENCY DEPARTMENT Provider Note   CSN: [id number] Arrival date & time: 10/13/20  2130     History Chief Complaint  Patient presents with   Near Syncope    Tyler Lester is a 75 y.o. male.  The history is provided by the patient and medical records. No language interpreter was used.  Near Syncope This is a new problem. The current episode started less than 1 hour ago. The problem has not changed since onset.Pertinent negatives include no chest pain, no abdominal pain, no headaches and no shortness of breath. The symptoms are aggravated by standing. Nothing relieves the symptoms. He has tried nothing for the symptoms. The treatment provided no relief.      Past Medical History:  Diagnosis Date   Bilateral foot pain    Chronic pain    Diabetes mellitus    type 2, diet controlled now   Foley catheter in place since nov 2018, last changed 1 week ago   Hyperlipidemia    Hypertension    Inguinal hernia    right   Numbness    T/O   Parkinson disease (Tyler Lester) 08/26/2015   Followed by [facility]. Last visit 06/03/2015. History of poor compliance with his medication. Referred him back to [place] in 10/2016. They didn't want to see him back due to poor compliance with medical advise. 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   Diminished hearing, left 01/20/2016   Type 2 diabetes mellitus with complication (Tyler Lester)  [id number]   Gait abnormality 03/28/2015   Postlaminectomy syndrome, cervical region 07/23/2014   Parkinson's disease (Tyler Lester) 07/23/2014   Back pain with sciatica 05/27/2014   Bilateral leg pain 05/14/2014   Depression 10/15/2013   CKD (chronic kidney disease) stage 3, GFR 30-59 ml/min (HCC) 08/25/2013   Tremor 05/23/2013   Acute renal failure superimposed on stage 3 chronic kidney disease (Tyler Lester) 05/08/2013   Chronic diastolic heart failure (Tyler Lester) 05/06/2013   Onychogryposis of toenail 05/14/2012   GERD (gastroesophageal reflux disease) 07/04/2011   Essential hypertension 01/05/2011    Past Surgical History:  Procedure Laterality Date   CATARACT EXTRACTION W/ INTRAOCULAR LENS IMPLANT Left 06/2012   laser for posterior capsule?   COLON SURGERY     POSTERIOR CERVICAL FUSION/FORAMINOTOMY N/A 11/12/2013   Procedure: Posterior cervical decompression fusion, cervical 3-4, cervical 4-5, cervical 5-6, cervical 6-7 with instrumentation and allograft   (LEVEL 4);  Surgeon: Sinclair Ship, MD;  Location: [place];  Service: Orthopedics;  Laterality: N/A;  Posterior cervical decompression fusion, cervical 3-4, cervical 4-5, cervical 5-6, cervical 6-7 with instrumentation and allograft   TONSILLECTOMY     XI ROBOTIC ASSISTED SIMPLE PROSTATECTOMY N/A 03/07/2016   Procedure: XI ROBOTIC ASSISTED SIMPLE PROSTATECTOMY AND UMBILICAL HERNIA REPAIR flexible cystoscopy;  Surgeon: Alexis Frock, MD;  Location: [facility];  Service: Urology;  Laterality: N/A;       Family History  Problem Relation Age of  Onset   Cancer - Other Other    Diabetes Other    Hypertension Other     Social History   Tobacco Use   Smoking status: Never   Smokeless tobacco: Never  Vaping Use   Vaping Use: Never used  Substance Use Topics   Alcohol use: Yes    Alcohol/week: 0.0 standard drinks    Comment: 1 shot a day   Drug use: No    Home Medications Prior to Admission medications   Medication Sig Start Date End Date  Taking? Authorizing Provider  allopurinol (ZYLOPRIM) 300 MG tablet Take 300 mg by mouth daily. 02/21/20   [provider]  amLODipine (NORVASC) 5 MG tablet Take 1 tablet (5 mg total) by mouth daily. 03/04/20 04/03/20  Kayleen Memos, DO  atorvastatin (LIPITOR) 20 MG tablet Take 20 mg by mouth daily. Patient not taking: Reported on 07/18/2020 02/21/20   [provider]  carbidopa-levodopa (SINEMET CR) 50-200 MG tablet Take by mouth. 06/01/20   [provider]  carbidopa-levodopa (SINEMET) 25-100 MG tablet Take 2 tablets by mouth 4 (four) times daily. 03/03/20 06/01/20  Kayleen Memos, DO  cyclobenzaprine (FLEXERIL) 5 MG tablet Take 5 mg by mouth 3 (three) times daily as needed for muscle spasms. 02/05/20   [provider]  donepezil (ARICEPT) 10 MG tablet Take 1 tablet by mouth every day 12/15/19   Wilber Oliphant, MD  furosemide (LASIX) 40 MG tablet Take 40 mg by mouth daily. Patient not taking: Reported on 07/18/2020 01/06/20   [provider]  gabapentin (NEURONTIN) 300 MG capsule Take 300 mg by mouth daily as needed (nerve pain). 02/05/20   [provider]  omeprazole (PRILOSEC) 20 MG capsule Take 20 mg by mouth daily. 02/22/20   [provider]  ondansetron (ZOFRAN ODT) 4 MG disintegrating tablet Take 1 tablet (4 mg total) by mouth every 8 (eight) hours as needed for nausea or vomiting. 04/18/20   Sharion Balloon, NP  oxyCODONE-acetaminophen (PERCOCET) 7.5-325 MG tablet Take 1 tablet by mouth 4 (four) times daily as needed for moderate pain. 09/10/19   [provider]  Potassium Chloride ER 20 MEQ TBCR Take 1 tablet by mouth daily. Patient not taking: Reported on 07/18/2020 02/05/20   [provider]  Vitamin D, Ergocalciferol, (DRISDOL) 1.25 MG (50000 UNIT) CAPS capsule Take 50,000 Units by mouth every Tuesday. 02/04/20   [provider]    Allergies    Poison ivy treatments  Review of Systems   Review of Systems   Constitutional:  Positive for fatigue. Negative for chills and fever.  HENT:  Negative for congestion and rhinorrhea.   Respiratory:  Negative for cough, chest tightness, shortness of breath and wheezing.   Cardiovascular:  Positive for near-syncope. Negative for chest pain and palpitations.  Gastrointestinal:  Negative for abdominal pain, constipation, diarrhea, nausea and vomiting.  Genitourinary:  Positive for dysuria and frequency.  Musculoskeletal:  Negative for back pain, neck pain and neck stiffness.  Skin:  Negative for wound.  Neurological:  Positive for light-headedness. Negative for dizziness, weakness, numbness and headaches.  Psychiatric/Behavioral:  Negative for agitation.   All other systems reviewed and are negative.  Physical Exam Updated Vital Signs BP (!) 97/53   Pulse 87   Temp 98.2 F (36.8 C)   Resp (!) 28   Ht '5\' 9"'$  (1.753 m)   Wt 81.6 kg   SpO2 97%   BMI 26.58 kg/m   Physical Exam Vitals and  nursing note reviewed.  Constitutional:      General: He is not in acute distress.    Appearance: He is well-developed. He is not ill-appearing, toxic-appearing or diaphoretic.  HENT:     Head: Normocephalic and atraumatic.     Mouth/Throat:     Mouth: Mucous membranes are dry.  Eyes:     Conjunctiva/sclera: Conjunctivae normal.  Cardiovascular:     Rate and Rhythm: Normal rate and regular rhythm.     Heart sounds: No murmur heard. Pulmonary:     Effort: Pulmonary effort is normal. No respiratory distress.     Breath sounds: Normal breath sounds. No wheezing, rhonchi or rales.  Chest:     Chest wall: No tenderness.  Abdominal:     General: Abdomen is flat.     Palpations: Abdomen is soft.     Tenderness: There is no abdominal tenderness. There is no guarding or rebound.  Musculoskeletal:        General: No tenderness.     Cervical back: Neck supple.  Skin:    General: Skin is warm and dry.     Capillary Refill: Capillary refill takes less than 2  seconds.     Findings: No erythema.  Neurological:     General: No focal deficit present.     Mental Status: He is alert.  Psychiatric:        Mood and Affect: Mood normal.    ED Results / Procedures / Treatments   Labs (all labs ordered are listed, but only abnormal results are displayed) Labs Reviewed  COMPREHENSIVE METABOLIC PANEL - Abnormal; Notable for the following components:      Result Value   Potassium 5.8 (*)    CO2 21 (*)    Glucose, Bld 124 (*)    BUN 53 (*)    Creatinine, Ser 4.46 (*)    Albumin 3.3 (*)    AST 13 (*)    GFR, Estimated 13 (*)    All other components within normal limits  RESP PANEL BY RT-PCR (FLU A&B, COVID) [id number]  CULTURE, BLOOD (ROUTINE X 2)  CULTURE, BLOOD (ROUTINE X 2)  URINE CULTURE  CBC WITH DIFFERENTIAL/PLATELET  LACTIC ACID, PLASMA  LACTIC ACID, PLASMA  URINALYSIS, ROUTINE W REFLEX MICROSCOPIC  TSH  BASIC METABOLIC PANEL    EKG EKG Interpretation  Date/Time:  Thursday October 13 2020 22:11:18 EDT Ventricular Rate:  86 PR Interval:  147 QRS Duration: 86 QT Interval:  371 QTC Calculation: 444 R Axis:   6 Text Interpretation: Sinus rhythm Borderline ST elevation, anterior leads When compared to prior, similar appernace. No STEMI Confirmed by Antony Blackbird [phone]) on 10/13/2020 10:55:56 PM  Radiology DG Chest Portable 1 View  Result Date: 10/13/2020 CLINICAL DATA:  Recent syncopal episode EXAM: PORTABLE CHEST 1 VIEW COMPARISON:  06/07/2020 FINDINGS: Cardiac shadow is stable. The lungs are well aerated bilaterally. No focal infiltrate or sizable effusion is seen. No bony abnormality is noted. IMPRESSION: No acute abnormality noted. Electronically Signed   By: Inez Catalina M.D.   On: 10/13/2020 22:42    Procedures Procedures   Medications Ordered in ED Medications  lactated ringers infusion (has no administration in time range)  heparin injection 5,000 Units (has no administration in time range)  acetaminophen (TYLENOL)  tablet 650 mg (has no administration in time range)    Or  acetaminophen (TYLENOL) suppository 650 mg (has no administration in time range)  ondansetron (ZOFRAN) tablet 4 mg (has no administration in time range)  Or  ondansetron [facility]) injection 4 mg (has no administration in time range)  sodium chloride 0.9 % bolus 1,000 mL (0 mLs Intravenous Stopped 10/13/20 2326)    ED Course  I have reviewed the triage vital signs and the nursing notes.  Pertinent labs & imaging results that were available during my care of the patient were reviewed by me and considered in my medical decision making (see chart for details).    MDM Rules/Calculators/A&P                           MOTTY SPANO is a 75 y.o. male with a past medical history significant for CKD, Parkinson's disease, diabetes, GERD, hypertension, hyperlipidemia, and CHF who presents with several days of urinary frequency, dysuria, fatigue, and several episodes of near syncope.  Patient reports that he has had some urinary symptoms several days as a feeling more more tired.  He says that he has had lightheadedness when he tries to stand and then this evening when he try to get out of a car he became very lightheaded and near syncopal and went to the ground.  He did not hit his head and denies losing consciousness but on arrival was found to have blood pressure in the 70s.  He is reporting some mild cough but no fevers or chills.  He is denying any chest pain, palpitations, or shortness of breath.  Denies any new leg pain or leg swelling.  Reports his mouth feels dry and he has not been eating or drinking much since she has been feeling bad.  Has any COVID exposures.  Denies any arm pain, leg pain, back pain, or neck pain.  Says he had several near syncopal episodes as well.  On exam, while laying flat, blood pressure started to improve into the 90s from the 70s.  Lungs were clear.  Chest was nontender and abdomen was nontender.  Patient does  not have edema on exam.  Mouth was dry and mucous membranes were dry.  Patient was not tachycardic on exam but he was tachypneic.  Clinically I am concerned about dehydration related to decreased oral intake in the setting of possible UTI leading to near syncope.  Patient is likely high risk near syncope given the hypotension in the 70s on arrival, history of heart failure, and his episodes.  Had discussion with patient and we will get work-up to look for infections as well as give some fluids given feeling better.  Anticipate shared decision-making conversation about disposition after work-up is completed.  11:16 PM Creatinine is gone from 2.3 to 4.4.  This is concerning for AKI in the setting of the dehydration.  He has not yet been able to urinate for [place].  Chest x-ray revealed no pneumonia.  Patient will be admitted for rehydration for his AKI, and his concerning nursing left side.  Bladder scan was ordered to help assess if there is a bladder obstruction and if so he will likely need a Foley catheter.  Patient be admitted for further management.  Final Clinical Impression(s) / ED Diagnoses Final diagnoses:  Near syncope  Hypotension, unspecified hypotension type  AKI (acute kidney injury) ([place])       Clinical Impression: 1. Near syncope   2. Hypotension, unspecified hypotension type   3. AKI (acute kidney injury) ([place])   4. Acute kidney failure ([place])     Disposition: Admit  This note was prepared with assistance of Dragon voice  recognition software. Occasional wrong-word or sound-a-like substitutions may have occurred due to the inherent limitations of voice recognition software.     Kealii Thueson, Gwenyth Allegra, MD 10/14/20 [phone]

## 2020-10-13 NOTE — ED Triage Notes (Signed)
Pt bib gems after near syncope. Pt experiencing weakness for the past couple days and became very dizzy approx 2 hours ago. Pt endorses multiple falls recently but denies any head trauma. Hx of parkinsons.   BP: 76/43 HR: 80  Spo2: 98% RA  CBG: 133

## 2020-10-14 ENCOUNTER — Encounter (HOSPITAL_COMMUNITY): Payer: Self-pay | Admitting: Internal Medicine

## 2020-10-14 ENCOUNTER — Other Ambulatory Visit: Payer: Self-pay

## 2020-10-14 ENCOUNTER — Inpatient Hospital Stay (HOSPITAL_COMMUNITY): Payer: 59

## 2020-10-14 DIAGNOSIS — R55 Syncope and collapse: Secondary | ICD-10-CM | POA: Diagnosis not present

## 2020-10-14 DIAGNOSIS — N183 Chronic kidney disease, stage 3 unspecified: Secondary | ICD-10-CM

## 2020-10-14 DIAGNOSIS — I959 Hypotension, unspecified: Secondary | ICD-10-CM

## 2020-10-14 DIAGNOSIS — I10 Essential (primary) hypertension: Secondary | ICD-10-CM

## 2020-10-14 DIAGNOSIS — G20 Parkinson's disease: Secondary | ICD-10-CM

## 2020-10-14 DIAGNOSIS — N179 Acute kidney failure, unspecified: Principal | ICD-10-CM

## 2020-10-14 LAB — CBG MONITORING, ED
Glucose-Capillary: 90 mg/dL (ref 70–99)
Glucose-Capillary: 118 mg/dL — ABNORMAL HIGH (ref 70–99)
Glucose-Capillary: 82 mg/dL (ref 70–99)
Glucose-Capillary: 79 mg/dL (ref 70–99)
Glucose-Capillary: 133 mg/dL — ABNORMAL HIGH (ref 70–99)

## 2020-10-14 LAB — URINALYSIS, COMPLETE (UACMP) WITH MICROSCOPIC
Bilirubin Urine: NEGATIVE
Glucose, UA: NEGATIVE mg/dL
Ketones, ur: 5 mg/dL — AB
Nitrite: NEGATIVE
Protein, ur: 30 mg/dL — AB
Specific Gravity, Urine: 1.013 (ref 1.005–1.030)
WBC, UA: >50 WBC/hpf — ABNORMAL HIGH (ref 0–5)
pH: 5 (ref 5.0–8.0)

## 2020-10-14 LAB — GLUCOSE, CAPILLARY
Glucose-Capillary: 106 mg/dL — ABNORMAL HIGH (ref 70–99)
Glucose-Capillary: 83 mg/dL (ref 70–99)
Glucose-Capillary: 93 mg/dL (ref 70–99)

## 2020-10-14 LAB — BASIC METABOLIC PANEL
Anion gap: 10 (ref 5–15)
BUN: 48 mg/dL — ABNORMAL HIGH (ref 8–23)
CO2: 23 mmol/L (ref 22–32)
Calcium: 9.1 mg/dL (ref 8.9–10.3)
Chloride: 104 mmol/L (ref 98–111)
Creatinine, Ser: 3.91 mg/dL — ABNORMAL HIGH (ref 0.61–1.24)
GFR, Estimated: 15 mL/min — ABNORMAL LOW (ref >60)
Glucose, Bld: 89 mg/dL (ref 70–99)
Potassium: 5.3 mmol/L — ABNORMAL HIGH (ref 3.5–5.1)
Sodium: 137 mmol/L (ref 135–145)

## 2020-10-14 LAB — LACTIC ACID, PLASMA
Lactic Acid, Venous: 1.4 mmol/L (ref 0.5–1.9)
Lactic Acid, Venous: 1.1 mmol/L (ref 0.5–1.9)

## 2020-10-14 LAB — HEMOGLOBIN A1C
Hgb A1c MFr Bld: 6.1 % — ABNORMAL HIGH (ref 4.8–5.6)
Mean Plasma Glucose: 128.37 mg/dL

## 2020-10-14 LAB — MRSA NEXT GEN BY PCR, NASAL: MRSA by PCR Next Gen: NOT DETECTED

## 2020-10-14 MED ORDER — PANTOPRAZOLE SODIUM 40 MG PO TBEC
80.0000 mg | DELAYED_RELEASE_TABLET | Freq: Every day | ORAL | Status: DC
  Administered 2020-10-14 – 2020-10-15 (×2): 80 mg via ORAL
  Filled 2020-10-14 (×2): qty 2

## 2020-10-14 MED ORDER — CARBIDOPA-LEVODOPA 25-250 MG PO TABS
1.0000 | ORAL_TABLET | Freq: Four times a day (QID) | ORAL | Status: DC
  Administered 2020-10-14 – 2020-10-15 (×4): 1 via ORAL
  Filled 2020-10-14 (×8): qty 1

## 2020-10-14 MED ORDER — SODIUM CHLORIDE 0.9 % IV SOLN
INTRAVENOUS | Status: DC
  Administered 2020-10-14: 125 mL/h via INTRAVENOUS

## 2020-10-14 MED ORDER — MELATONIN 5 MG PO TABS
5.0000 mg | ORAL_TABLET | Freq: Every evening | ORAL | Status: DC | PRN
  Administered 2020-10-14: 5 mg via ORAL
  Filled 2020-10-14 (×2): qty 1

## 2020-10-14 MED ORDER — OXYCODONE-ACETAMINOPHEN 7.5-325 MG PO TABS
1.0000 | ORAL_TABLET | Freq: Four times a day (QID) | ORAL | Status: DC | PRN
  Filled 2020-10-14: qty 1

## 2020-10-14 MED ORDER — GABAPENTIN 600 MG PO TABS
600.0000 mg | ORAL_TABLET | Freq: Every evening | ORAL | Status: DC | PRN
  Administered 2020-10-14: 600 mg via ORAL
  Filled 2020-10-14: qty 1

## 2020-10-14 MED ORDER — DONEPEZIL HCL 10 MG PO TABS
10.0000 mg | ORAL_TABLET | Freq: Every day | ORAL | Status: DC
  Administered 2020-10-14 – 2020-10-15 (×2): 10 mg via ORAL
  Filled 2020-10-14 (×2): qty 1

## 2020-10-14 MED ORDER — INSULIN ASPART 100 UNIT/ML IJ SOLN: 0.0000 [IU] | INTRAMUSCULAR | Status: DC

## 2020-10-14 NOTE — Progress Notes (Addendum)
   Patient seen and examined at bedside, patient admitted after midnight, please see earlier detailed admission note by Etta Quill, DO. Briefly, patient presented secondary to near syncope and found to have an AKI on top of underlying CKD. Started on IV fluids  Subjective: Dry mouth. No other concerns  BP 136/80   Pulse 83   Temp 98.3 F (36.8 C)   Resp 16   Ht '5\' 9"'$  (1.753 m)   Wt 81.6 kg   SpO2 98%   BMI 26.58 kg/m   General exam: Appears calm and comfortable Respiratory system: Clear to auscultation. Respiratory effort normal. Cardiovascular system: S1 & S2 heard, RRR. No murmurs, rubs, gallops or clicks. Gastrointestinal system: Abdomen is nondistended, soft and nontender. No organomegaly or masses felt. Normal bowel sounds heard. Central nervous system: Alert and oriented. No focal neurological deficits. Musculoskeletal: No edema. No calf tenderness Skin: No cyanosis. No rashes Psychiatry: Judgement and insight appear normal. Mood & affect appropriate.   Brief assessment/Plan:  AKI on CKD stage IIIb Thought to be secondary to pre-renal cause from dehydration. Renal ultrasound without hydronephrosis. Started on IV fluids -Continue IV fluids -BMP in AM  Parkinson disease Patient is on Sinemet and Aricept as an outpatient. -PT/OT consult -Continue Sinemet and Aricept  Hyperkalemia In setting of AKI. Trending down with IV fluids. -BMP in AM  Primary hypertension Patient is on amlodipine as an outpatient which was held secondary to low blood pressure.  Family communication: None at bedside DVT prophylaxis: Subcutaneous heparin Disposition: Discharge home likely in 24 hours pending improvement of AKI, PT, OT  Cordelia Poche, MD [facility] 10/14/2020, 2:23 PM

## 2020-10-14 NOTE — H&P (Signed)
History and Physical    Tyler Lester [id number] DOB: 09/26/1945 DOA: 10/13/2020  PCP: [facility] [place]  Patient coming from: Home  I have personally briefly reviewed patient's old medical records in [place]  Chief Complaint: Near syncope  HPI: Tyler Lester is a 75 y.o. male with medical history significant of PD, HTN, CKD 3, DM.  Pt with somewhat chronic poor PO intake due to parkinson's dz.  H/o AKI in Jan this year felt to be due to dehydration.  Improved with IVF.  Pt presents to ED today with c/o several day h/o urinary frequency, dysuria, fatigue, and several episodes of near syncope.  Symptoms persistent, worsening.  Worse with standing up from seated position.  No fevers, chills, back pain, urinary retention.   ED Course: Initial BP [id number] systolic, improves to 0000000 with laying down flat.  Given 1L IVF bolus with SBP now in the 120s.  Creat 4.4 up from 1.7 baseline  WBC nl, no SIRS.  K 5.8   Review of Systems: As per HPI, otherwise all review of systems negative.  Past Medical History:  Diagnosis Date   Bilateral foot pain    Chronic pain    Diabetes mellitus    type 2, diet controlled now   Foley catheter in place since nov 2018, last changed 1 week ago   Hyperlipidemia    Hypertension    Inguinal hernia    right   Numbness    T/O   Parkinson disease ([place]) 08/26/2015   Followed by [facility]. Last visit 06/03/2015. History of poor compliance with his medication. Referred him back to [facility] in 10/2016. They didn't want to see him back due to poor compliance with medical advise. Last plan from 06/03/2015 still valid   Parkinson's disease ([place])    Pneumonia yrs ago   Shortness of breath    with exertion   Urinary retention     Past Surgical History:  Procedure Laterality Date   CATARACT EXTRACTION W/ INTRAOCULAR LENS IMPLANT Left 06/2012   laser for posterior capsule?   COLON SURGERY     POSTERIOR CERVICAL FUSION/FORAMINOTOMY N/A 11/12/2013    Procedure: Posterior cervical decompression fusion, cervical 3-4, cervical 4-5, cervical 5-6, cervical 6-7 with instrumentation and allograft   (LEVEL 4);  Surgeon: Sinclair Ship, MD;  Location: [facility];  Service: Orthopedics;  Laterality: N/A;  Posterior cervical decompression fusion, cervical 3-4, cervical 4-5, cervical 5-6, cervical 6-7 with instrumentation and allograft   TONSILLECTOMY     XI ROBOTIC ASSISTED SIMPLE PROSTATECTOMY N/A 03/07/2016   Procedure: XI ROBOTIC ASSISTED SIMPLE PROSTATECTOMY AND UMBILICAL HERNIA REPAIR flexible cystoscopy;  Surgeon: Alexis Frock, MD;  Location: [facility];  Service: Urology;  Laterality: N/A;     reports that he has never smoked. He has never used smokeless tobacco. He reports current alcohol use. He reports that he does not use drugs.  Allergies  Allergen Reactions   Poison Ivy Treatments Hives    Pt denies allergy to this medication    Family History  Problem Relation Age of Onset   Cancer - Other Other    Diabetes Other    Hypertension Other      Prior to Admission medications   Medication Sig Start Date End Date Taking? Authorizing Provider  allopurinol (ZYLOPRIM) 300 MG tablet Take 300 mg by mouth daily. 02/21/20  Yes [provider]  amantadine (SYMMETREL) 100 MG capsule Take 100 mg by mouth 2 (two) times daily. 09/29/20  Yes  [provider]  amLODipine (NORVASC) 5 MG tablet Take 1 tablet (5 mg total) by mouth daily. 03/04/20 10/13/20 Yes Hall, Carole N, DO  carbidopa-levodopa (SINEMET IR) 25-250 MG tablet Take 1 tablet by mouth 4 (four) times daily.   Yes [provider]  [facility] MELATONIN 5 MG TABS Take 5 mg by mouth at bedtime as needed (sleep). 09/02/20  Yes [provider]  cyclobenzaprine (FLEXERIL) 5 MG tablet Take 5 mg by mouth 3 (three) times daily as needed for muscle spasms. 02/05/20  Yes [provider]  diclofenac Sodium (VOLTAREN) 1 % GEL Apply 2-4 g topically daily as needed. Legs, hips  10/04/20  Yes [provider]  donepezil (ARICEPT) 10 MG tablet Take 1 tablet by mouth every day Patient taking differently: Take 10 mg by mouth daily. 12/15/19  Yes Wilber Oliphant, MD  gabapentin (NEURONTIN) 600 MG tablet Take 600 mg by mouth at bedtime as needed (pain). 10/04/20  Yes [provider]  metFORMIN (GLUCOPHAGE) 500 MG tablet Take 500 mg by mouth daily. 09/05/20  Yes [provider]  naloxone (NARCAN) nasal spray 4 mg/0.1 mL Place 4 mg into the nose as needed. 10/04/20  Yes [provider]  omeprazole (PRILOSEC) 40 MG capsule Take 40 mg by mouth 2 (two) times daily. 09/10/20  Yes [provider]  ondansetron (ZOFRAN ODT) 4 MG disintegrating tablet Take 1 tablet (4 mg total) by mouth every 8 (eight) hours as needed for nausea or vomiting. 04/18/20  Yes Sharion Balloon, NP  oxyCODONE-acetaminophen (PERCOCET) 7.5-325 MG tablet Take 1 tablet by mouth 4 (four) times daily as needed for moderate pain. 09/10/19  Yes [provider]  Vitamin D, Ergocalciferol, (DRISDOL) 1.25 MG (50000 UNIT) CAPS capsule Take 50,000 Units by mouth every Tuesday. 02/04/20  Yes [provider]  carbidopa-levodopa (SINEMET) 25-100 MG tablet Take 2 tablets by mouth 4 (four) times daily. Patient not taking: Reported on 10/13/2020 03/03/20 10/13/20  Kayleen Memos, DO    Physical Exam: Vitals:   10/13/20 2144 10/13/20 2145 10/13/20 2230 10/13/20 2245  BP: (!) 78/50 (!) 97/53 118/80 (!) 124/56  Pulse:   98   Resp:   (!) 27 (!) 27  Temp:      SpO2:   95% 100%  Weight:      Height:        Constitutional: NAD, calm, comfortable Eyes: PERRL, lids and conjunctivae normal ENMT: Mucous membranes are moist. Posterior pharynx clear of any exudate or lesions.Normal dentition.  Neck: normal, supple, no masses, no thyromegaly Respiratory: clear to auscultation bilaterally, no wheezing, no crackles. Normal respiratory effort. No accessory muscle use.  Cardiovascular:  Regular rate and rhythm, no murmurs / rubs / gallops. No extremity edema. 2+ pedal pulses. No carotid bruits.  Abdomen: no tenderness, no masses palpated. No hepatosplenomegaly. Bowel sounds positive.  Musculoskeletal: no clubbing / cyanosis. No joint deformity upper and lower extremities. Good ROM, no contractures. Normal muscle tone.  Skin: no rashes, lesions, ulcers. No induration Neurologic: Tremor in BUE, soft voice Psychiatric: Normal judgment and insight. Alert and oriented x 3. Normal mood.    Labs on Admission: I have personally reviewed following labs and imaging studies  CBC: Recent Labs  Lab 10/13/20 2150  WBC 9.7  NEUTROABS 7.2  HGB 14.8  HCT 47.8  MCV 93.5  PLT [id number]   Basic Metabolic Panel: Recent Labs  Lab 10/13/20 2150  NA 136  K 5.8*  CL 102  CO2 21*  GLUCOSE 124*  BUN 53*  CREATININE 4.46*  CALCIUM 9.4   GFR: Estimated Creatinine Clearance: 14.8 mL/min (A) (by C-G formula based on SCr of 4.46 mg/dL (H)). Liver Function Tests: Recent Labs  Lab 10/13/20 2150  AST 13*  ALT <5  ALKPHOS 47  BILITOT 0.7  PROT 7.4  ALBUMIN 3.3*   No results for input(s): LIPASE, AMYLASE in the last 168 hours. No results for input(s): AMMONIA in the last 168 hours. Coagulation Profile: No results for input(s): INR, PROTIME in the last 168 hours. Cardiac Enzymes: No results for input(s): CKTOTAL, CKMB, CKMBINDEX, TROPONINI in the last 168 hours. BNP (last 3 results) No results for input(s): PROBNP in the last 8760 hours. HbA1C: No results for input(s): HGBA1C in the last 72 hours. CBG: No results for input(s): GLUCAP in the last 168 hours. Lipid Profile: No results for input(s): CHOL, HDL, LDLCALC, TRIG, CHOLHDL, LDLDIRECT in the last 72 hours. Thyroid Function Tests: No results for input(s): TSH, T4TOTAL, FREET4, T3FREE, THYROIDAB in the last 72 hours. Anemia Panel: No results for input(s): VITAMINB12, FOLATE, FERRITIN, TIBC, IRON, RETICCTPCT in the last 72  hours. Urine analysis:    Component Value Date/Time   COLORURINE YELLOW 09/25/2019 1944   APPEARANCEUR CLEAR 09/25/2019 1944   LABSPEC 1.014 09/25/2019 1944   PHURINE 6.0 09/25/2019 1944   GLUCOSEU NEGATIVE 09/25/2019 1944   HGBUR NEGATIVE 09/25/2019 1944   BILIRUBINUR NEGATIVE 09/25/2019 1944   BILIRUBINUR NEGATIVE 05/27/2014 1020   KETONESUR NEGATIVE 09/25/2019 1944   PROTEINUR NEGATIVE 09/25/2019 1944   UROBILINOGEN 1.0 10/29/2014 0042   NITRITE NEGATIVE 09/25/2019 1944   LEUKOCYTESUR NEGATIVE 09/25/2019 1944    Radiological Exams on Admission: DG Chest Portable 1 View  Result Date: 10/13/2020 CLINICAL DATA:  Recent syncopal episode EXAM: PORTABLE CHEST 1 VIEW COMPARISON:  06/07/2020 FINDINGS: Cardiac shadow is stable. The lungs are well aerated bilaterally. No focal infiltrate or sizable effusion is seen. No bony abnormality is noted. IMPRESSION: No acute abnormality noted. Electronically Signed   By: Inez Catalina M.D.   On: 10/13/2020 22:42    EKG: Independently reviewed.  Assessment/Plan Principal Problem:   Acute renal failure superimposed on stage 3 chronic kidney disease (HCC) Active Problems:   Essential hypertension   Parkinson's disease ([place])    AKF on CKD 3 - Most likely pre-renal / dehydration, h/o same in Jan this year. Only 200cc on bladder scan Renal US to r/o obstruction IVF: 1L bolus in ED + NS at 125 Tele monitor for mild hyperkalemia Repeat BMP in AM Strict intake and output UA pending If not rapidly resolving then consult nephro in AM PD - Cont sinemet, aricept Hold amantadine due to AKI HTN - Holding Norvasc due to initial low BP in ED  DVT prophylaxis: Heparin Gregory Code Status: DNR - as per pal care note Family Communication: No family in room Disposition Plan: Home after renal recovery Consults called: None Admission status: Admit to inpatient  Severity of Illness: The appropriate patient status for this patient is INPATIENT. Inpatient  status is judged to be reasonable and necessary in order to provide the required intensity of service to ensure the patient's safety. The patient's presenting symptoms, physical exam findings, and initial radiographic and laboratory data in the context of their chronic comorbidities is felt to place them at high risk for further clinical deterioration. Furthermore, it is not anticipated that the patient will be medically stable for discharge from the hospital within 2 midnights of admission. The following factors  support the patient status of inpatient.   Patient has acute kidney injury.  Patient has one of the following: Increase in Serum Creatinine >0.3 mg/dL within 48h Increase in Serum Creatinine > 1.5 times baseline known or presumed to have been within the last 7 days Urine volume < 0.5 ml/kg/hr for 6 hours   * I certify that at the point of admission it is my clinical judgment that the patient will require inpatient hospital care spanning beyond 2 midnights from the point of admission due to high intensity of service, high risk for further deterioration and high frequency of surveillance required.*   Jhoel Stieg M. DO [facility]  How to contact the [facility] Attending or Consulting provider [place] or covering provider during after hours [place], for this patient?  Check the care team in [facility] and look for a) attending/consulting [facility] provider listed and b) the [facility] team listed Log into [email or web address]  Amion Physician Scheduling and messaging for groups and whole hospitals  On call and physician scheduling software for group practices, residents, hospitalists and other medical providers for call, clinic, rotation and shift schedules. On[employer] is a hospital-wide system for scheduling doctors and paging doctors on call. EasyPlot is for scientific plotting and data analysis.  [email or web address]  and use [place]'s universal password to access. If you do not have the password, please  contact the hospital operator.  Locate the [facility] provider you are looking for under [facility] and page to a number that you can be directly reached. If you still have difficulty reaching the provider, please page the [facility] (Director on Call) for the Hospitalists listed on amion for assistance.  10/14/2020, 12:35 AM

## 2020-10-14 NOTE — Progress Notes (Signed)
[facility] night shift telemetry coverage note.  The patient requested to the nursing staff to have his home oxycodone and gabapentin released due to persistent and increasing pain.  Gabapentin 600 mg as needed at bedtime and Percocet 7.5/325 mg p.o. 4 times daily as needed x2 doses ordered.  The primary dayshift team will address pain management in the morning.  Tennis Must, MD.

## 2020-10-14 NOTE — ED Notes (Signed)
Second lactic to be collected at 0207 due to delay in first lactic.

## 2020-10-14 NOTE — ED Notes (Signed)
Lab to add on hemoglobin A1c

## 2020-10-15 DIAGNOSIS — N179 Acute kidney failure, unspecified: Secondary | ICD-10-CM | POA: Diagnosis not present

## 2020-10-15 DIAGNOSIS — I10 Essential (primary) hypertension: Secondary | ICD-10-CM | POA: Diagnosis not present

## 2020-10-15 DIAGNOSIS — G20 Parkinson's disease: Secondary | ICD-10-CM | POA: Diagnosis not present

## 2020-10-15 DIAGNOSIS — N1832 Chronic kidney disease, stage 3b: Secondary | ICD-10-CM

## 2020-10-15 LAB — BASIC METABOLIC PANEL
Anion gap: 9 (ref 5–15)
BUN: 31 mg/dL — ABNORMAL HIGH (ref 8–23)
CO2: 21 mmol/L — ABNORMAL LOW (ref 22–32)
Calcium: 8.8 mg/dL — ABNORMAL LOW (ref 8.9–10.3)
Chloride: 106 mmol/L (ref 98–111)
Creatinine, Ser: 1.92 mg/dL — ABNORMAL HIGH (ref 0.61–1.24)
GFR, Estimated: 36 mL/min — ABNORMAL LOW (ref >60)
Glucose, Bld: 85 mg/dL (ref 70–99)
Potassium: 4.7 mmol/L (ref 3.5–5.1)
Sodium: 136 mmol/L (ref 135–145)

## 2020-10-15 LAB — GLUCOSE, CAPILLARY
Glucose-Capillary: 84 mg/dL (ref 70–99)
Glucose-Capillary: 113 mg/dL — ABNORMAL HIGH (ref 70–99)
Glucose-Capillary: 107 mg/dL — ABNORMAL HIGH (ref 70–99)

## 2020-10-15 NOTE — Progress Notes (Signed)
RN went over d/c summary with pt. Belongings with pt. Nt removed PIV and transported pt to private vehicle where pt caregiver transporting pt home.

## 2020-10-15 NOTE — Discharge Summary (Signed)
Physician Discharge Summary  Tyler Lester YOV:[id number] DOB: [birth year] DOA: 10/13/2020  PCP: [facility]  Admit date: 10/13/2020 Discharge date: 10/15/2020  Admitted From: Home Disposition: Home  Recommendations for Outpatient Follow-up:  Follow up with PCP in 1 week Consider discontinuing metformin as an outpatient in setting of kidney disease Please obtain BMP/CBC in one week Please follow up on the following pending results: None  Home Health: None Equipment/Devices: None  Discharge Condition: Stable CODE STATUS: DNR Diet recommendation: Heart healthy   Brief/Interim Summary:  Admission HPI written by Tyler Quill, DO   HPI: Tyler Lester is a 75 y.o. male with medical history significant of PD, HTN, CKD 3, DM.   Pt with somewhat chronic poor PO intake due to parkinson's dz.  H/o AKI in Jan this year felt to be due to dehydration.  Improved with IVF.   Pt presents to ED today with c/o several day h/o urinary frequency, dysuria, fatigue, and several episodes of near syncope.  Symptoms persistent, worsening.  Worse with standing up from seated position.   No fevers, chills, back pain, urinary retention.  Hospital course:  AKI on CKD stage IIIb Thought to be secondary to pre-renal cause from dehydration. Renal ultrasound without hydronephrosis. Started on IV fluids with improvement and resolution of AKI. Creatinine of 1.92 on discharge.  Near syncope Secondary to dehydration and resultant hypotension.   Parkinson disease Patient is on Sinemet and Aricept as an outpatient. -PT/OT consult -Continue Sinemet and Aricept   Hyperkalemia In setting of AKI. Resolved with IV fluids  Diabetes mellitus, type 2 Patient is on metformin as an outpatient. May want to consider switching with patient's borderline kidney disease.   Primary hypertension Patient is on amlodipine as an outpatient which was held secondary to low blood pressure. Now  uncontrolled after fluid resuscitation so will recommend to restart home regimen.  Discharge Diagnoses:  Principal Problem:   Acute renal failure superimposed on stage 3 chronic kidney disease ([place]) Active Problems:   Essential hypertension   Parkinson's disease ([place])    Discharge Instructions   Allergies as of 10/15/2020       Reactions   Poison Ivy Treatments Hives   Pt denies allergy to this medication        Medication List     TAKE these medications    allopurinol 300 MG tablet Commonly known as: ZYLOPRIM Take 300 mg by mouth daily.   amantadine 100 MG capsule Commonly known as: SYMMETREL Take 100 mg by mouth 2 (two) times daily.   amLODipine 5 MG tablet Commonly known as: NORVASC Take 1 tablet (5 mg total) by mouth daily.   carbidopa-levodopa 25-250 MG tablet Commonly known as: SINEMET IR Take 1 tablet by mouth 4 (four) times daily. What changed: Another medication with the same name was removed. Continue taking this medication, and follow the directions you see here.   [facility] Melatonin 5 MG Tabs Generic drug: melatonin Take 5 mg by mouth at bedtime as needed (sleep).   cyclobenzaprine 5 MG tablet Commonly known as: FLEXERIL Take 5 mg by mouth 3 (three) times daily as needed for muscle spasms.   diclofenac Sodium 1 % Gel Commonly known as: VOLTAREN Apply 2-4 g topically daily as needed. Legs, hips   donepezil 10 MG tablet Commonly known as: ARICEPT Take 1 tablet by mouth every day   gabapentin 600 MG tablet Commonly known as: NEURONTIN Take 600 mg by mouth at bedtime as needed (pain).  metFORMIN 500 MG tablet Commonly known as: GLUCOPHAGE Take 500 mg by mouth daily.   naloxone 4 MG/0.1ML Liqd nasal spray kit Commonly known as: NARCAN Place 4 mg into the nose as needed.   omeprazole 40 MG capsule Commonly known as: PRILOSEC Take 40 mg by mouth 2 (two) times daily.   ondansetron 4 MG disintegrating tablet Commonly known as: Zofran  ODT Take 1 tablet (4 mg total) by mouth every 8 (eight) hours as needed for nausea or vomiting.   oxyCODONE-acetaminophen 7.5-325 MG tablet Commonly known as: PERCOCET Take 1 tablet by mouth 4 (four) times daily as needed for moderate pain.   Vitamin D (Ergocalciferol) 1.25 MG (50000 UNIT) Caps capsule Commonly known as: DRISDOL Take 50,000 Units by mouth every Tuesday.        [name] Follow up in 1 week(s).   Why: For hospital follow-up Contact information: [place] [address] [phone]                Allergies  Allergen Reactions   Poison Ivy Treatments Hives    Pt denies allergy to this medication    Consultations: None   Procedures/Studies: US RENAL  Result Date: 10/14/2020 CLINICAL DATA:  Acute renal failure. EXAM: RENAL / URINARY TRACT ULTRASOUND COMPLETE COMPARISON:  None. FINDINGS: Right Kidney: Renal measurements: 10.4 cm x 4.9 cm x 4.7 cm = volume: 124.49 mL. Echogenicity within normal limits. A 1.5 cm x 1.2 cm x 1.5 cm anechoic structure is seen within the upper pole of the right kidney. No abnormal flow is noted within this region on color Doppler evaluation. No hydronephrosis is visualized. Left Kidney: Renal measurements: 9.5 cm x 4.6 cm x 3.6 cm = volume: 82.22 mL. Echogenicity within normal limits. No mass or hydronephrosis visualized. Bladder: Appears normal for degree of bladder distention. Other: Prostate gland measures 6.1 cm x 6.4 cm x 4.5 cm. IMPRESSION: 1. Simple right renal cyst. 2. Enlarged prostate gland. Electronically Signed   By: Virgina Norfolk M.D.   On: 10/14/2020 01:00   DG Chest Portable 1 View  Result Date: 10/13/2020 CLINICAL DATA:  Recent syncopal episode EXAM: PORTABLE CHEST 1 VIEW COMPARISON:  06/07/2020 FINDINGS: Cardiac shadow is stable. The lungs are well aerated bilaterally. No focal infiltrate or sizable effusion is seen. No bony abnormality is noted.  IMPRESSION: No acute abnormality noted. Electronically Signed   By: Inez Catalina M.D.   On: 10/13/2020 22:42     Subjective: No issues overnight. Feels like himself.  Discharge Exam: Vitals:   10/15/20 0756 10/15/20 1122  BP: (!) 143/84 126/76  Pulse: 83 84  Resp: 15 20  Temp: 98 F (36.7 C) 97.9 F (36.6 C)  SpO2: 97% 96%   Vitals:   10/14/20 2300 10/15/20 0341 10/15/20 0756 10/15/20 1122  BP: (!) 152/77 (!) 155/84 (!) 143/84 126/76  Pulse: 82 85 83 84  Resp: _0 Temp: 97.9 F (36.6 C) 98.1 F (36.7 C) 98 F (36.7 C) 97.9 F (36.6 C)  TempSrc: Oral Oral Oral Oral  SpO2: 97% 98% 97% 96%  Weight:      Height:        General: Pt is alert, awake, not in acute distress Cardiovascular: RRR, S1/S2 +, no rubs, no gallops Respiratory: CTA bilaterally, no wheezing, no rhonchi Abdominal: Soft, NT, ND, bowel sounds + Extremities: no edema, no cyanosis    The results of significant diagnostics from this  hospitalization (including imaging, microbiology, ancillary and laboratory) are listed below for reference.     Microbiology: Recent Results (from the past 240 hour(s))  Resp Panel by RT-PCR (Flu A&B, Covid) Nasopharyngeal Swab     Status: None   Collection Time: 10/13/20 10:03 PM   Specimen: Nasopharyngeal Swab; Nasopharyngeal(NP) swabs in vial transport medium  Result Value Ref Range Status   SARS Coronavirus 2 by RT PCR NEGATIVE NEGATIVE Final    Comment: (NOTE) SARS-CoV-2 target nucleic acids are NOT DETECTED.  The SARS-CoV-2 RNA is generally detectable in upper respiratory specimens during the acute phase of infection. The lowest concentration of SARS-CoV-2 viral copies this assay can detect is 138 copies/mL. A negative result does not preclude SARS-Cov-2 infection and should not be used as the sole basis for treatment or other patient management decisions. A negative result may occur with  improper specimen collection/handling, submission of specimen  other than nasopharyngeal swab, presence of viral mutation(s) within the areas targeted by this assay, and inadequate number of viral copies(<138 copies/mL). A negative result must be combined with clinical observations, patient history, and epidemiological information. The expected result is Negative.  Fact Sheet for Patients:  EntrepreneurPulse.com.au  Fact Sheet for Healthcare Providers:  IncredibleEmployment.be  This test is no t yet approved or cleared by the [place] FDA and  has been authorized for detection and/or diagnosis of SARS-CoV-2 by FDA under an Emergency Use Authorization (EUA). This EUA will remain  in effect (meaning this test can be used) for the duration of the COVID-19 declaration under Section 564(b)(1) of the Act, 21 U.S.C.section 360bbb-3(b)(1), unless the authorization is terminated  or revoked sooner.       Influenza A by PCR NEGATIVE NEGATIVE Final   Influenza B by PCR NEGATIVE NEGATIVE Final    Comment: (NOTE) The Xpert Xpress SARS-CoV-2/FLU/RSV plus assay is intended as an aid in the diagnosis of influenza from Nasopharyngeal swab specimens and should not be used as a sole basis for treatment. Nasal washings and aspirates are unacceptable for Xpert Xpress SARS-CoV-2/FLU/RSV testing.  Fact Sheet for Patients: EntrepreneurPulse.com.au  Fact Sheet for Healthcare Providers: IncredibleEmployment.be  This test is not yet approved or cleared by the [place] FDA and has been authorized for detection and/or diagnosis of SARS-CoV-2 by FDA under an Emergency Use Authorization (EUA). This EUA will remain in effect (meaning this test can be used) for the duration of the COVID-19 declaration under Section 564(b)(1) of the Act, 21 U.S.C. section 360bbb-3(b)(1), unless the authorization is terminated or revoked.  Performed at [facility], [place] [address], [place],  [place] [address]   Urine Culture     Status: Abnormal (Preliminary result)   Collection Time: 10/13/20 10:03 PM   Specimen: Urine, Clean Catch  Result Value Ref Range Status   Specimen Description URINE, CLEAN CATCH  Final   Special Requests NONE  Final   Culture (A)  Final    >=100,000 COLONIES/mL ESCHERICHIA COLI SUSCEPTIBILITIES TO FOLLOW Performed at [facility], [place] [address], [place], [place] [address]    Report Status PENDING  Incomplete  Blood culture (routine x 2)     Status: None (Preliminary result)   Collection Time: 10/13/20 10:15 PM   Specimen: BLOOD LEFT FOREARM  Result Value Ref Range Status   Specimen Description BLOOD LEFT FOREARM  Final   Special Requests   Final    BOTTLES DRAWN AEROBIC AND ANAEROBIC Blood Culture adequate volume   Culture   Final  NO GROWTH 2 DAYS Performed at [facility], [place] [address], [place], [place] [address]    Report Status PENDING  Incomplete  Blood culture (routine x 2)     Status: None (Preliminary result)   Collection Time: 10/13/20 10:22 PM   Specimen: BLOOD RIGHT HAND  Result Value Ref Range Status   Specimen Description BLOOD RIGHT HAND  Final   Special Requests   Final    BOTTLES DRAWN AEROBIC AND ANAEROBIC Blood Culture results may not be optimal due to an inadequate volume of blood received in culture bottles   Culture   Final    NO GROWTH 2 DAYS Performed at [facility], [place] [address], [place], [place] [address]    Report Status PENDING  Incomplete  MRSA Next Gen by PCR, Nasal     Status: None   Collection Time: 10/14/20  4:17 PM   Specimen: Nasal Mucosa; Nasal Swab  Result Value Ref Range Status   MRSA by PCR Next Gen NOT DETECTED NOT DETECTED Final    Comment: (NOTE) The GeneXpert MRSA Assay (FDA approved for NASAL specimens only), is one component of a comprehensive MRSA colonization surveillance program. It is not intended to diagnose MRSA infection nor to guide or monitor treatment for MRSA  infections. Test performance is not FDA approved in patients less than 50 years old. Performed at [facility], [place] [address], [place], [place] [address]      Labs: BNP (last 3 results) No results for input(s): BNP in the last 8760 hours. Basic Metabolic Panel: Recent Labs  Lab 10/13/20 2150 10/14/20 0151 10/15/20 0709  NA 136 137 136  K 5.8* 5.3* 4.7  CL 102 104 106  CO2 21* 23 21*  GLUCOSE 124* 89 85  BUN 53* 48* 31*  CREATININE 4.46* 3.91* 1.92*  CALCIUM 9.4 9.1 8.8*   Liver Function Tests: Recent Labs  Lab 10/13/20 2150  AST 13*  ALT <5  ALKPHOS 47  BILITOT 0.7  PROT 7.4  ALBUMIN 3.3*   No results for input(s): LIPASE, AMYLASE in the last 168 hours. No results for input(s): AMMONIA in the last 168 hours. CBC: Recent Labs  Lab 10/13/20 2150  WBC 9.7  NEUTROABS 7.2  HGB 14.8  HCT 47.8  MCV 93.5  PLT 185   Cardiac Enzymes: No results for input(s): CKTOTAL, CKMB, CKMBINDEX, TROPONINI in the last 168 hours. BNP: Invalid input(s): POCBNP CBG: Recent Labs  Lab 10/14/20 1928 10/14/20 2334 10/15/20 0340 10/15/20 0759 10/15/20 1113  GLUCAP 93 83 84 113* 107*   D-Dimer No results for input(s): DDIMER in the last 72 hours. Hgb A1c Recent Labs    10/13/20 2150  HGBA1C 6.1*   Lipid Profile No results for input(s): CHOL, HDL, LDLCALC, TRIG, CHOLHDL, LDLDIRECT in the last 72 hours. Thyroid function studies No results for input(s): TSH, T4TOTAL, T3FREE, THYROIDAB in the last 72 hours.  Invalid input(s): FREET3 Anemia work up No results for input(s): VITAMINB12, FOLATE, FERRITIN, TIBC, IRON, RETICCTPCT in the last 72 hours. Urinalysis    Component Value Date/Time   COLORURINE YELLOW 10/14/2020 0046   APPEARANCEUR CLOUDY (A) 10/14/2020 0046   LABSPEC 1.013 10/14/2020 0046   PHURINE 5.0 10/14/2020 0046   GLUCOSEU NEGATIVE 10/14/2020 0046   HGBUR SMALL (A) 10/14/2020 0046   BILIRUBINUR NEGATIVE 10/14/2020 0046   BILIRUBINUR NEGATIVE  05/27/2014 1020   KETONESUR 5 (A) 10/14/2020 0046   PROTEINUR 30 (A) 10/14/2020 0046   UROBILINOGEN 1.0  10/29/2014 0042   NITRITE NEGATIVE 10/14/2020 0046   LEUKOCYTESUR LARGE (A) 10/14/2020 0046   Sepsis Labs Invalid input(s): PROCALCITONIN,  WBC,  LACTICIDVEN Microbiology Recent Results (from the past 240 hour(s))  Resp Panel by RT-PCR (Flu A&B, Covid) Nasopharyngeal Swab     Status: None   Collection Time: 10/13/20 10:03 PM   Specimen: Nasopharyngeal Swab; Nasopharyngeal(NP) swabs in vial transport medium  Result Value Ref Range Status   SARS Coronavirus 2 by RT PCR NEGATIVE NEGATIVE Final    Comment: (NOTE) SARS-CoV-2 target nucleic acids are NOT DETECTED.  The SARS-CoV-2 RNA is generally detectable in upper respiratory specimens during the acute phase of infection. The lowest concentration of SARS-CoV-2 viral copies this assay can detect is 138 copies/mL. A negative result does not preclude SARS-Cov-2 infection and should not be used as the sole basis for treatment or other patient management decisions. A negative result may occur with  improper specimen collection/handling, submission of specimen other than nasopharyngeal swab, presence of viral mutation(s) within the areas targeted by this assay, and inadequate number of viral copies(<138 copies/mL). A negative result must be combined with clinical observations, patient history, and epidemiological information. The expected result is Negative.  Fact Sheet for Patients:  EntrepreneurPulse.com.au  Fact Sheet for Healthcare Providers:  IncredibleEmployment.be  This test is no t yet approved or cleared by the [place] FDA and  has been authorized for detection and/or diagnosis of SARS-CoV-2 by FDA under an Emergency Use Authorization (EUA). This EUA will remain  in effect (meaning this test can be used) for the duration of the COVID-19 declaration under Section 564(b)(1) of the Act,  21 U.S.C.section 360bbb-3(b)(1), unless the authorization is terminated  or revoked sooner.       Influenza A by PCR NEGATIVE NEGATIVE Final   Influenza B by PCR NEGATIVE NEGATIVE Final    Comment: (NOTE) The Xpert Xpress SARS-CoV-2/FLU/RSV plus assay is intended as an aid in the diagnosis of influenza from Nasopharyngeal swab specimens and should not be used as a sole basis for treatment. Nasal washings and aspirates are unacceptable for Xpert Xpress SARS-CoV-2/FLU/RSV testing.  Fact Sheet for Patients: EntrepreneurPulse.com.au  Fact Sheet for Healthcare Providers: IncredibleEmployment.be  This test is not yet approved or cleared by the [place] FDA and has been authorized for detection and/or diagnosis of SARS-CoV-2 by FDA under an Emergency Use Authorization (EUA). This EUA will remain in effect (meaning this test can be used) for the duration of the COVID-19 declaration under Section 564(b)(1) of the Act, 21 U.S.C. section 360bbb-3(b)(1), unless the authorization is terminated or revoked.  Performed at [facility], [place] [address] [place], [place] [address]   Urine Culture     Status: Abnormal (Preliminary result)   Collection Time: 10/13/20 10:03 PM   Specimen: Urine, Clean Catch  Result Value Ref Range Status   Specimen Description URINE, CLEAN CATCH  Final   Special Requests NONE  Final   Culture (A)  Final    >=100,000 COLONIES/mL ESCHERICHIA COLI SUSCEPTIBILITIES TO FOLLOW Performed at [facility], [place] [address] [place], [place] [address]    Report Status PENDING  Incomplete  Blood culture (routine x 2)     Status: None (Preliminary result)   Collection Time: 10/13/20 10:15 PM   Specimen: BLOOD LEFT FOREARM  Result Value Ref Range Status   Specimen Description BLOOD LEFT FOREARM  Final   Special Requests   Final    BOTTLES DRAWN AEROBIC AND ANAEROBIC Blood  Culture adequate volume   Culture   Final     NO GROWTH 2 DAYS Performed at [facility], [place] [address] [place], [place] [address]    Report Status PENDING  Incomplete  Blood culture (routine x 2)     Status: None (Preliminary result)   Collection Time: 10/13/20 10:22 PM   Specimen: BLOOD RIGHT HAND  Result Value Ref Range Status   Specimen Description BLOOD RIGHT HAND  Final   Special Requests   Final    BOTTLES DRAWN AEROBIC AND ANAEROBIC Blood Culture results may not be optimal due to an inadequate volume of blood received in culture bottles   Culture   Final    NO GROWTH 2 DAYS Performed at [facility], [place] [address], [place], [place] [address]    Report Status PENDING  Incomplete  MRSA Next Gen by PCR, Nasal     Status: None   Collection Time: 10/14/20  4:17 PM   Specimen: Nasal Mucosa; Nasal Swab  Result Value Ref Range Status   MRSA by PCR Next Gen NOT DETECTED NOT DETECTED Final    Comment: (NOTE) The GeneXpert MRSA Assay (FDA approved for NASAL specimens only), is one component of a comprehensive MRSA colonization surveillance program. It is not intended to diagnose MRSA infection nor to guide or monitor treatment for MRSA infections. Test performance is not FDA approved in patients less than 17 years old. Performed at [facility], [place] [address] [place], [place] [address]      Time coordinating discharge: 35 minutes  SIGNED:   Cordelia Poche, MD [facility] 10/15/2020, 12:12 PM

## 2020-10-15 NOTE — Evaluation (Signed)
Occupational Therapy Evaluation and Discharge Patient Details Name: Tyler Lester MRN: [id number] DOB: 01-16-1946 Today's Date: 10/15/2020    History of Present Illness The pt is a 75 yo male presenting 8/18 after enar-syncope, BP 76/43 on arrival. Upon further workup, pt found to have AKI on CKD IIIb. PMH includes: chronic pain, DM II, HLD, HTN, Parkinson's, and depression.   Clinical Impression   Pt ambulated with a cane primarily prior to admission. He is assisted for ADL and IADL by an aide 9 hours a day and his family can assist as needed outside of those hours. Pt is likely functioning at or near his baseline. He demonstrated stable VS with OOB activities this visit. No further OT needs.     Follow Up Recommendations  No OT follow up    Equipment Recommendations  None recommended by OT    Recommendations for Other Services       Precautions / Restrictions Precautions Precautions: Fall Restrictions Weight Bearing Restrictions: No      Mobility Bed Mobility Overal bed mobility: Modified Independent             General bed mobility comments: HOB up, no use of rail, but reports he sleeps in a hospital bed    Transfers Overall transfer level: Needs assistance Equipment used: Straight cane Transfers: Sit to/from Stand Sit to Stand: Min guard         General transfer comment: used momentum to stand from low surface    Balance Overall balance assessment: Needs assistance   Sitting balance-Leahy Scale: Good       Standing balance-Leahy Scale: Fair                             ADL either performed or assessed with clinical judgement   ADL Overall ADL's : At baseline                                             Vision Baseline Vision/History: Wears glasses Wears Glasses: At all times Patient Visual Report: No change from baseline       Perception     Praxis      Pertinent Vitals/Pain Pain Assessment: No/denies pain      Hand Dominance Right   Extremity/Trunk Assessment Upper Extremity Assessment Upper Extremity Assessment: RUE deficits/detail;LUE deficits/detail RUE Coordination: decreased fine motor LUE Coordination: decreased fine motor   Lower Extremity Assessment Lower Extremity Assessment: Defer to PT evaluation LLE Deficits / Details: pt with slightly increased tremors in LLE (LUE > LLE), and increased difficulty with moving against gravity. more impaired by PD at baseline LLE Coordination: decreased fine motor;decreased gross motor   Cervical / Trunk Assessment Cervical / Trunk Assessment: Kyphotic   Communication Communication Communication: No difficulties   Cognition Arousal/Alertness: Awake/alert Behavior During Therapy: WFL for tasks assessed/performed Overall Cognitive Status: No family/caregiver present to determine baseline cognitive functioning                                 General Comments: pt answering questions appropriately, able to state needs and identify safety concerns   General Comments       Exercises     Shoulder Instructions      Home Living Family/patient expects to be discharged to::  Private residence Living Arrangements: Alone Available Help at Discharge: Family;Available PRN/intermittently;Personal care attendant;Available 24 hours/day (aid 9 hour a day) Type of Home: House Home Access: Ramped entrance     Home Layout: One level     Bathroom Shower/Tub: Teacher, early years/pre: Handicapped height     Home Equipment: Environmental consultant - 2 wheels;Cane - single point;Shower seat;Grab bars - tub/shower;Hand held shower head;Wheelchair - manual;Hospital bed          Prior Functioning/Environment Level of Independence: Needs assistance  Gait / Transfers Assistance Needed: pt reports use of SPC for ambulation in the home, is able to call for his aide when needing assist for bed mobility/sit-stand or gait ADL's / Homemaking  Assistance Needed: states his caregiver provides            OT Problem List:        OT Treatment/Interventions:      OT Goals(Current goals can be found in the care plan section) Acute Rehab OT Goals Patient Stated Goal: home today  OT Frequency:     Barriers to D/C:            Co-evaluation   Reason for Co-Treatment: For patient/therapist safety;To address functional/ADL transfers PT goals addressed during session: Mobility/safety with mobility;Balance        AM-PAC OT "6 Clicks" Daily Activity     Outcome Measure Help from another person eating meals?: A Little Help from another person taking care of personal grooming?: A Little Help from another person toileting, which includes using toliet, bedpan, or urinal?: A Little Help from another person bathing (including washing, rinsing, drying)?: A Lot Help from another person to put on and taking off regular upper body clothing?: A Lot Help from another person to put on and taking off regular lower body clothing?: Total 6 Click Score: 14   End of Session Equipment Utilized During Treatment: Gait belt (cane)  Activity Tolerance: Patient tolerated treatment well Patient left: in chair;with call bell/phone within reach;with chair alarm set  OT Visit Diagnosis: Unsteadiness on feet (R26.81);Other abnormalities of gait and mobility (R26.89)                Time: [id number] OT Time Calculation (min): 25 min Charges:  OT General Charges $OT Visit: 1 Visit OT Evaluation $OT Eval Moderate Complexity: 1 Mod  Nestor Lewandowsky, OTR/L Acute Rehabilitation Services Pager: [phone] Office: [phone]   Malka So 10/15/2020, 11:39 AM

## 2020-10-15 NOTE — Plan of Care (Signed)

## 2020-10-15 NOTE — Evaluation (Signed)
Physical Therapy Evaluation Patient Details Name: Tyler Lester MRN: [id number] DOB: Nov 10, 1945 Today's Date: 10/15/2020   History of Present Illness  The pt is a 75 yo male presenting 8/18 after enar-syncope, BP 76/43 on arrival. Upon further workup, pt found to have AKI on CKD IIIb. PMH includes: chronic pain, DM II, HLD, HTN, Parkinson's, and depression.   Clinical Impression  Pt in bed upon arrival of PT, agreeable to evaluation at this time. Prior to admission the pt was ambulating with use of SPC within the home, relied on assist from PCA (9 hours each day) for ADLs and IADLs. The pt now presents with minor limitations in functional mobility or activity tolerance related to this admission. The pt was able to demo safe technique with bed mobility and sit-stand transfers with use of DME he has access to at home. He was also able to complete hallway ambulation with use of SPC, no LOB, VSS, and minG only for safety. The pt reports that current weakness and tremors (L > R), are his baseline with PD, and he is able to manage with the assist he has arranged at home and DME he has acquired. The pt is safe to return home from a mobility standpoint, no follow-up services needed. Thank you for the consult.      Follow Up Recommendations No PT follow up;Supervision for mobility/OOB    Equipment Recommendations  None recommended by PT (pt well equipped)    Recommendations for Other Services       Precautions / Restrictions Precautions Precautions: Fall Restrictions Weight Bearing Restrictions: No      Mobility  Bed Mobility Overal bed mobility: Modified Independent             General bed mobility comments: HOB up, no use of rail, but reports he sleeps in a hospital bed    Transfers Overall transfer level: Needs assistance Equipment used: Straight cane Transfers: Sit to/from Stand Sit to Stand: Min guard         General transfer comment: used momentum to stand from low  surface  Ambulation/Gait Ambulation/Gait assistance: Min guard;+2 safety/equipment (chair follow) Gait Distance (Feet): 75 Feet Assistive device: Straight cane Gait Pattern/deviations: Step-to pattern;Decreased step length - left;Decreased stride length;Decreased weight shift to left Gait velocity: WFL Gait velocity interpretation: 1.31 - 2.62 ft/sec, indicative of limited community ambulator General Gait Details: pt with steady gait, deficits persist in LLE but pt with good management and use of SPC, no LOB. VSS with ambulation.      Balance Overall balance assessment: Needs assistance Sitting-balance support: No upper extremity supported;Feet supported Sitting balance-Leahy Scale: Good     Standing balance support: Single extremity supported;During functional activity Standing balance-Leahy Scale: Fair Standing balance comment: pt reliant on single UE support                             Pertinent Vitals/Pain Pain Assessment: No/denies pain    Home Living Family/patient expects to be discharged to:: Private residence Living Arrangements: Alone Available Help at Discharge: Family;Available PRN/intermittently;Personal care attendant;Available 24 hours/day (aid 9 hour a day) Type of Home: House Home Access: Ramped entrance     Home Layout: One level Home Equipment: Walker - 2 wheels;Cane - single point;Shower seat;Grab bars - tub/shower;Hand held shower head;Wheelchair - manual;Hospital bed      Prior Function Level of Independence: Needs assistance   Gait / Transfers Assistance Needed: pt reports use of SPC  for ambulation in the home, is able to call for his aide when needing assist for bed mobility/sit-stand or gait  ADL's / Homemaking Assistance Needed: states his caregiver provides        Hand Dominance   Dominant Hand: Right    Extremity/Trunk Assessment   Upper Extremity Assessment Upper Extremity Assessment: RUE deficits/detail;LUE  deficits/detail RUE Coordination: decreased fine motor LUE Coordination: decreased fine motor    Lower Extremity Assessment Lower Extremity Assessment: Defer to PT evaluation LLE Deficits / Details: pt with slightly increased tremors in LLE (LUE > LLE), and increased difficulty with moving against gravity. more impaired by PD at baseline LLE Coordination: decreased fine motor;decreased gross motor    Cervical / Trunk Assessment Cervical / Trunk Assessment: Kyphotic  Communication   Communication: No difficulties  Cognition Arousal/Alertness: Awake/alert Behavior During Therapy: WFL for tasks assessed/performed Overall Cognitive Status: No family/caregiver present to determine baseline cognitive functioning                                 General Comments: pt answering questions appropriately, able to state needs and identify safety concerns      General Comments General comments (skin integrity, edema, etc.): VSS, BP elevated through session high of 181/103 post ambulation        Assessment/Plan    PT Assessment Patent does not need any further PT services         PT Goals (Current goals can be found in the Care Plan section)  Acute Rehab PT Goals Patient Stated Goal: home today PT Goal Formulation: With patient Time For Goal Achievement: 10/22/20 Potential to Achieve Goals: Good            Co-evaluation PT/OT/SLP Co-Evaluation/Treatment: Yes Reason for Co-Treatment: For patient/therapist safety;To address functional/ADL transfers PT goals addressed during session: Mobility/safety with mobility;Balance         AM-PAC PT "6 Clicks" Mobility  Outcome Measure Help needed turning from your back to your side while in a flat bed without using bedrails?: A Little Help needed moving from lying on your back to sitting on the side of a flat bed without using bedrails?: A Little Help needed moving to and from a bed to a chair (including a wheelchair)?: A  Little Help needed standing up from a chair using your arms (e.g., wheelchair or bedside chair)?: A Little Help needed to walk in hospital room?: A Little Help needed climbing 3-5 steps with a railing? : A Little 6 Click Score: 18    End of Session Equipment Utilized During Treatment: Gait belt Activity Tolerance: Patient tolerated treatment well Patient left: in chair;with call bell/phone within reach;with chair alarm set Nurse Communication: Mobility status PT Visit Diagnosis: Other abnormalities of gait and mobility (R26.89)    Time: [id number] PT Time Calculation (min) (ACUTE ONLY): 24 min   Charges:   PT Evaluation $PT Eval Low Complexity: 1 Low          Alexza Norbeck Allen Kell, PT, DPT   Acute Rehabilitation Department Pager #: [phone]  Sandra Cockayne 10/15/2020, 11:42 AM

## 2020-10-15 NOTE — Discharge Instructions (Addendum)
Tyler Lester,  You were in the hospital with dehydration. You were given IV fluids and have improved. I recommend to hold your medications but stay well hydrated. Your kidney function has returned to baseline.

## 2020-10-16 LAB — URINE CULTURE: Culture: >=100000 — AB

## 2020-10-18 LAB — CULTURE, BLOOD (ROUTINE X 2)
Culture: NO GROWTH
Culture: NO GROWTH
Special Requests: ADEQUATE

## 2020-10-27 ENCOUNTER — Other Ambulatory Visit: Payer: Self-pay

## 2020-10-27 ENCOUNTER — Encounter (HOSPITAL_COMMUNITY): Payer: Self-pay

## 2020-10-27 ENCOUNTER — Ambulatory Visit (INDEPENDENT_AMBULATORY_CARE_PROVIDER_SITE_OTHER): Payer: 59

## 2020-10-27 ENCOUNTER — Ambulatory Visit (HOSPITAL_COMMUNITY)
Admission: EM | Admit: 2020-10-27 | Discharge: 2020-10-27 | Disposition: A | Discharge status: Home / Self Care | Payer: 59 | Attending: Physician Assistant | Admitting: Physician Assistant

## 2020-10-27 DIAGNOSIS — U071 COVID-19: Secondary | ICD-10-CM | POA: Diagnosis not present

## 2020-10-27 DIAGNOSIS — J069 Acute upper respiratory infection, unspecified: Secondary | ICD-10-CM

## 2020-10-27 DIAGNOSIS — R059 Cough, unspecified: Secondary | ICD-10-CM

## 2020-10-27 LAB — POC INFLUENZA A AND B ANTIGEN (URGENT CARE ONLY)
INFLUENZA A ANTIGEN, POC: NEGATIVE
INFLUENZA B ANTIGEN, POC: NEGATIVE

## 2020-10-27 MED ORDER — BENZONATATE 100 MG PO CAPS: 100.0000 mg | ORAL_CAPSULE | Freq: Three times a day (TID) | ORAL | 0 refills | Status: DC

## 2020-10-27 NOTE — ED Triage Notes (Signed)
Pt in with c/o ST and productive cough that started yesterday  Pt has not had any medication for sx

## 2020-10-27 NOTE — Discharge Instructions (Addendum)
Your x-ray was normal which is great news.  Your flu test is negative.  We will contact you if your COVID test is positive.  Use Tessalon for cough.  Make sure you are alternating over-the-counter medications for symptom relief.  Follow-up with your primary care provider within a week to ensure symptom improvement.  If anything worsens he develop high fever, cough, nausea, vomiting, shortness of breath, chest pain you need to go to the hospital.

## 2020-10-27 NOTE — ED Provider Notes (Signed)
[facility]    CSN: [id number] Arrival date & time: 10/27/20  1316      History   Chief Complaint Chief Complaint  Patient presents with   Cough   Sore Throat    HPI Tyler Lester is a 75 y.o. male.   Patient presents today with a 2-day history of URI symptoms.  Reports sore throat, productive cough, fatigue, malaise.  He denies any chest pain, shortness of breath, nausea, vomiting.  He has not tried any over-the-counter medication for symptom management.  Denies any known sick contacts.  Denies any recent antibiotic use.  He denies any history of chronic lung condition such as asthma or COPD and does not smoke.  He does have a complicated medical history including diabetes as well as Parkinson's.  Reports that he is up-to-date on immunizations and believes that he has had COVID-19 vaccinations including booster.   Past Medical History:  Diagnosis Date   Bilateral foot pain    Chronic pain    Diabetes mellitus    type 2, diet controlled now   Foley catheter in place since nov 2018, last changed 1 week ago   Hyperlipidemia    Hypertension    Inguinal hernia    right   Numbness    T/O   Parkinson disease ([place]) 08/26/2015   Followed by [facility]. Last visit 06/03/2015. History of poor compliance with his medication. Referred him back to [facility] in 10/2016. They didn't want to see him back due to poor compliance with medical advise. Last plan from 06/03/2015 still valid   Parkinson's disease ([place])    Pneumonia yrs ago   Shortness of breath    with exertion   Urinary retention     Patient Active Problem List   Diagnosis Date Noted   Acute focal neurological deficit 03/01/2020   Ischial pain, right 08/25/2019   Palpitations 11/24/2018   Onychomycosis 08/05/2017   Chronic bilateral low back pain with bilateral sciatica 01/30/2017   Memory change 11/09/2016   Hyperlipidemia 11/05/2016   Diabetic polyneuropathy associated with type 2 diabetes mellitus ([place]) 04/13/2016   BPH  with Refracotry Urinary Retention 03/07/2016   Diminished hearing, left 01/20/2016   Type 2 diabetes mellitus with complication ([place]) [id number]   Gait abnormality 03/28/2015   Postlaminectomy syndrome, cervical region 07/23/2014   Parkinson's disease ([place]) 07/23/2014   Back pain with sciatica 05/27/2014   Bilateral leg pain 05/14/2014   Depression 10/15/2013   CKD (chronic kidney disease) stage 3, GFR 30-59 ml/min ([facility]) 08/25/2013   Tremor 05/23/2013   Acute renal failure superimposed on stage 3 chronic kidney disease ([place]) 05/08/2013   Chronic diastolic heart failure ([place]) 05/06/2013   Onychogryposis of toenail 05/14/2012   GERD (gastroesophageal reflux disease) 07/04/2011   Essential hypertension 01/05/2011    Past Surgical History:  Procedure Laterality Date   CATARACT EXTRACTION W/ INTRAOCULAR LENS IMPLANT Left 06/2012   laser for posterior capsule?   COLON SURGERY     POSTERIOR CERVICAL FUSION/FORAMINOTOMY N/A 11/12/2013   Procedure: Posterior cervical decompression fusion, cervical 3-4, cervical 4-5, cervical 5-6, cervical 6-7 with instrumentation and allograft   (LEVEL 4);  Surgeon: Sinclair Ship, MD;  Location: [facility];  Service: Orthopedics;  Laterality: N/A;  Posterior cervical decompression fusion, cervical 3-4, cervical 4-5, cervical 5-6, cervical 6-7 with instrumentation and allograft   TONSILLECTOMY     XI ROBOTIC ASSISTED SIMPLE PROSTATECTOMY N/A 03/07/2016   Procedure: XI ROBOTIC ASSISTED SIMPLE PROSTATECTOMY AND UMBILICAL HERNIA REPAIR flexible  cystoscopy;  Surgeon: Alexis Frock, MD;  Location: [facility];  Service: Urology;  Laterality: N/A;       Home Medications    Prior to Admission medications   Medication Sig Start Date End Date Taking? Authorizing Provider  benzonatate (TESSALON) 100 MG capsule Take 1 capsule (100 mg total) by mouth every 8 (eight) hours. 10/27/20  Yes Jumanah Hynson K, PA-C  allopurinol (ZYLOPRIM) 300 MG tablet Take 300 mg by mouth daily.  02/21/20   [provider]  amantadine (SYMMETREL) 100 MG capsule Take 100 mg by mouth 2 (two) times daily. 09/29/20   [provider]  amLODipine (NORVASC) 5 MG tablet Take 1 tablet (5 mg total) by mouth daily. 03/04/20 10/13/20  Kayleen Memos, DO  carbidopa-levodopa (SINEMET IR) 25-250 MG tablet Take 1 tablet by mouth 4 (four) times daily.    [provider]  [facility] MELATONIN 5 MG TABS Take 5 mg by mouth at bedtime as needed (sleep). 09/02/20   [provider]  cyclobenzaprine (FLEXERIL) 5 MG tablet Take 5 mg by mouth 3 (three) times daily as needed for muscle spasms. 02/05/20   [provider]  diclofenac Sodium (VOLTAREN) 1 % GEL Apply 2-4 g topically daily as needed. Legs, hips 10/04/20   [provider]  donepezil (ARICEPT) 10 MG tablet Take 1 tablet by mouth every day Patient taking differently: Take 10 mg by mouth daily. 12/15/19   Wilber Oliphant, MD  gabapentin (NEURONTIN) 600 MG tablet Take 600 mg by mouth at bedtime as needed (pain). 10/04/20   [provider]  metFORMIN (GLUCOPHAGE) 500 MG tablet Take 500 mg by mouth daily. 09/05/20   [provider]  naloxone [facility]) nasal spray 4 mg/0.1 mL Place 4 mg into the nose as needed. 10/04/20   [provider]  omeprazole (PRILOSEC) 40 MG capsule Take 40 mg by mouth 2 (two) times daily. 09/10/20   [provider]  ondansetron (ZOFRAN ODT) 4 MG disintegrating tablet Take 1 tablet (4 mg total) by mouth every 8 (eight) hours as needed for nausea or vomiting. 04/18/20   Sharion Balloon, NP  oxyCODONE-acetaminophen (PERCOCET) 7.5-325 MG tablet Take 1 tablet by mouth 4 (four) times daily as needed for moderate pain. 09/10/19   [provider]  Vitamin D, Ergocalciferol, (DRISDOL) 1.25 MG (50000 UNIT) CAPS capsule Take 50,000 Units by mouth every Tuesday. 02/04/20   [provider]    Family History Family History  Problem Relation Age of Onset   Cancer - Other  Other    Diabetes Other    Hypertension Other     Social History Social History   Tobacco Use   Smoking status: Never   Smokeless tobacco: Never  Vaping Use   Vaping Use: Never used  Substance Use Topics   Alcohol use: Yes    Alcohol/week: 0.0 standard drinks    Comment: 1 shot a day   Drug use: No     Allergies   Poison ivy treatments   Review of Systems Review of Systems  Constitutional:  Negative for activity change, appetite change, fatigue and fever.  HENT:  Positive for congestion, postnasal drip and sore throat. Negative for sinus pressure and sneezing.   Respiratory:  Positive for cough. Negative for shortness of breath.   Cardiovascular:  Negative for chest pain.  Gastrointestinal:  Negative for abdominal pain, diarrhea, nausea and vomiting.  Musculoskeletal:  Negative for arthralgias and myalgias.  Neurological:  Negative for dizziness, light-headedness and  headaches.    Physical Exam Triage Vital Signs ED Triage Vitals  Enc Vitals Group     BP 10/27/20 1353 108/61     Pulse Rate 10/27/20 1353 90     Resp 10/27/20 1353 18     Temp 10/27/20 1353 98.5 F (36.9 C)     Temp Source 10/27/20 1353 Oral     SpO2 10/27/20 1353 100 %     Weight --      Height --      Head Circumference --      Peak Flow --      Pain Score 10/27/20 1351 7     Pain Loc --      Pain Edu? --      Excl. in [place]? --    No data found.  Updated Vital Signs BP 108/61 (BP Location: Right Arm)   Pulse 90   Temp 98.5 F (36.9 C) (Oral)   Resp 18   SpO2 100%   Visual Acuity Right Eye Distance:   Left Eye Distance:   Bilateral Distance:    Right Eye Near:   Left Eye Near:    Bilateral Near:     Physical Exam Vitals reviewed.  Constitutional:      General: He is awake.     Appearance: Normal appearance. He is normal weight. He is not ill-appearing.     Comments: Very pleasant male appears stated age in no acute distress  HENT:     Head: Normocephalic and atraumatic.      Right Ear: Tympanic membrane, ear canal and external ear normal. Tympanic membrane is not erythematous or bulging.     Left Ear: Tympanic membrane, ear canal and external ear normal. Tympanic membrane is not erythematous or bulging.     Nose: Nose normal.     Mouth/Throat:     Pharynx: Uvula midline. Posterior oropharyngeal erythema present. No oropharyngeal exudate.  Cardiovascular:     Rate and Rhythm: Normal rate and regular rhythm.     Heart sounds: Normal heart sounds, S1 normal and S2 normal. No murmur heard. Pulmonary:     Effort: Pulmonary effort is normal. No accessory muscle usage or respiratory distress.     Breath sounds: No stridor. Examination of the right-lower field reveals rales. Examination of the left-lower field reveals rales. Rales present. No wheezing or rhonchi.  Abdominal:     General: Bowel sounds are normal.     Palpations: Abdomen is soft.     Tenderness: There is no abdominal tenderness.  Lymphadenopathy:     Head:     Right side of head: No submental, submandibular or tonsillar adenopathy.     Left side of head: No submental, submandibular or tonsillar adenopathy.     Cervical: No cervical adenopathy.  Neurological:     Mental Status: He is alert.  Psychiatric:        Behavior: Behavior is cooperative.     UC Treatments / Results  Labs (all labs ordered are listed, but only abnormal results are displayed) Labs Reviewed  SARS CORONAVIRUS 2 (TAT 6-24 HRS)  POC INFLUENZA A AND B ANTIGEN (URGENT CARE ONLY)    EKG   Radiology DG Chest 2 View  Result Date: 10/27/2020 CLINICAL DATA:  Rales, cough for 2 days EXAM: CHEST - 2 VIEW COMPARISON:  Radiograph 10/13/2020 FINDINGS: Unchanged cardiomediastinal silhouette. There is no new focal airspace disease. There is no large pleural effusion or visible pneumothorax. Bilateral shoulder degenerative changes. No acute osseous  abnormality. IMPRESSION: Evidence of acute cardiopulmonary disease. Electronically  Signed   By: Maurine Simmering M.D.   On: 10/27/2020 15:08    Procedures Procedures (including critical care time)  Medications Ordered in UC Medications - No data to display  Initial Impression / Assessment and Plan / UC Course  I have reviewed the triage vital signs and the nursing notes.  Pertinent labs & imaging results that were available during my care of the patient were reviewed by me and considered in my medical decision making (see chart for details).      Vital signs and physical exam reassuring today; no indication for emergent evaluation or imaging.  X-ray obtained given rales noted on exam showed no acute abnormality.  Flu testing was negative.  COVID test is pending.  Discussed likely viral etiology given short duration of symptoms.  Patient was encouraged to use over-the-counter medications for symptom management.  He was prescribed Tessalon for cough.  Encouraged him to follow-up with primary care provider within a week to ensure symptom improvement.  Discussed alarm symptoms that warrant emergent evaluation.  Strict return precautions given to which patient expressed understanding.  Final Clinical Impressions(s) / UC Diagnoses   Final diagnoses:  Upper respiratory tract infection, unspecified type  Cough     Discharge Instructions      Your x-ray was normal which is great news.  Your flu test is negative.  We will contact you if your COVID test is positive.  Use Tessalon for cough.  Make sure you are alternating over-the-counter medications for symptom relief.  Follow-up with your primary care provider within a week to ensure symptom improvement.  If anything worsens he develop high fever, cough, nausea, vomiting, shortness of breath, chest pain you need to go to the hospital.     ED Prescriptions     Medication Sig Dispense Auth. Provider   benzonatate (TESSALON) 100 MG capsule Take 1 capsule (100 mg total) by mouth every 8 (eight) hours. 21 capsule Paisly Fingerhut K,  PA-C      PDMP not reviewed this encounter.   Terrilee Croak, PA-C 10/27/20 1553

## 2020-10-28 LAB — SARS CORONAVIRUS 2 (TAT 6-24 HRS): SARS Coronavirus 2: POSITIVE — AB

## 2020-11-17 ENCOUNTER — Telehealth: Payer: Self-pay

## 2020-11-17 NOTE — Telephone Encounter (Signed)
Spoke with patient's caregiver [name] and rescheduled Palliative follow up appointment to 12/14/20 @ 1:30 PM with Dr. Hollace Kinnier. Documentation will be noted in [place].

## 2021-02-13 ENCOUNTER — Emergency Department (HOSPITAL_COMMUNITY): Payer: Medicare Other

## 2021-02-13 ENCOUNTER — Emergency Department (HOSPITAL_COMMUNITY)
Admission: EM | Admit: 2021-02-13 | Discharge: 2021-02-14 | Disposition: A | Discharge status: Home / Self Care | Payer: Medicare Other | Attending: Emergency Medicine | Admitting: Emergency Medicine

## 2021-02-13 ENCOUNTER — Other Ambulatory Visit: Payer: Self-pay

## 2021-02-13 ENCOUNTER — Encounter (HOSPITAL_COMMUNITY): Payer: Self-pay | Admitting: Emergency Medicine

## 2021-02-13 DIAGNOSIS — I5032 Chronic diastolic (congestive) heart failure: Secondary | ICD-10-CM | POA: Insufficient documentation

## 2021-02-13 DIAGNOSIS — N3000 Acute cystitis without hematuria: Secondary | ICD-10-CM | POA: Insufficient documentation

## 2021-02-13 DIAGNOSIS — Z79899 Other long term (current) drug therapy: Secondary | ICD-10-CM | POA: Insufficient documentation

## 2021-02-13 DIAGNOSIS — R399 Unspecified symptoms and signs involving the genitourinary system: Secondary | ICD-10-CM | POA: Diagnosis present

## 2021-02-13 DIAGNOSIS — E1142 Type 2 diabetes mellitus with diabetic polyneuropathy: Secondary | ICD-10-CM | POA: Insufficient documentation

## 2021-02-13 DIAGNOSIS — R531 Weakness: Secondary | ICD-10-CM | POA: Insufficient documentation

## 2021-02-13 DIAGNOSIS — Z20822 Contact with and (suspected) exposure to covid-19: Secondary | ICD-10-CM | POA: Diagnosis not present

## 2021-02-13 DIAGNOSIS — I130 Hypertensive heart and chronic kidney disease with heart failure and stage 1 through stage 4 chronic kidney disease, or unspecified chronic kidney disease: Secondary | ICD-10-CM | POA: Diagnosis not present

## 2021-02-13 DIAGNOSIS — N183 Chronic kidney disease, stage 3 unspecified: Secondary | ICD-10-CM | POA: Diagnosis not present

## 2021-02-13 DIAGNOSIS — Z7984 Long term (current) use of oral hypoglycemic drugs: Secondary | ICD-10-CM | POA: Insufficient documentation

## 2021-02-13 LAB — CBC WITH DIFFERENTIAL/PLATELET
Abs Immature Granulocytes: 0.02 10*3/uL (ref 0.00–0.07)
Basophils Absolute: 0 10*3/uL (ref 0.0–0.1)
Basophils Relative: 0 %
Eosinophils Absolute: 0.1 10*3/uL (ref 0.0–0.5)
Eosinophils Relative: 2 %
HCT: 50.7 % (ref 39.0–52.0)
Hemoglobin: 15.6 g/dL (ref 13.0–17.0)
Immature Granulocytes: 0 %
Lymphocytes Relative: 28 %
Lymphs Abs: 1.9 10*3/uL (ref 0.7–4.0)
MCH: 28.6 pg (ref 26.0–34.0)
MCHC: 30.8 g/dL (ref 30.0–36.0)
MCV: 93 fL (ref 80.0–100.0)
Monocytes Absolute: 0.5 10*3/uL (ref 0.1–1.0)
Monocytes Relative: 8 %
Neutro Abs: 4.1 10*3/uL (ref 1.7–7.7)
Neutrophils Relative %: 62 %
Platelets: 245 10*3/uL (ref 150–400)
RBC: 5.45 MIL/uL (ref 4.22–5.81)
RDW: 14.2 % (ref 11.5–15.5)
WBC: 6.7 10*3/uL (ref 4.0–10.5)
nRBC: 0 % (ref 0.0–0.2)

## 2021-02-13 LAB — COMPREHENSIVE METABOLIC PANEL
ALT: <5 U/L (ref 0–44)
AST: 13 U/L — ABNORMAL LOW (ref 15–41)
Albumin: 3.5 g/dL (ref 3.5–5.0)
Alkaline Phosphatase: 58 U/L (ref 38–126)
Anion gap: 7 (ref 5–15)
BUN: 36 mg/dL — ABNORMAL HIGH (ref 8–23)
CO2: 25 mmol/L (ref 22–32)
Calcium: 9.3 mg/dL (ref 8.9–10.3)
Chloride: 107 mmol/L (ref 98–111)
Creatinine, Ser: 2.4 mg/dL — ABNORMAL HIGH (ref 0.61–1.24)
GFR, Estimated: 28 mL/min — ABNORMAL LOW (ref >60)
Glucose, Bld: 113 mg/dL — ABNORMAL HIGH (ref 70–99)
Potassium: 5.9 mmol/L — ABNORMAL HIGH (ref 3.5–5.1)
Sodium: 139 mmol/L (ref 135–145)
Total Bilirubin: 0.4 mg/dL (ref 0.3–1.2)
Total Protein: 7.6 g/dL (ref 6.5–8.1)

## 2021-02-13 LAB — URINALYSIS, ROUTINE W REFLEX MICROSCOPIC
Bilirubin Urine: NEGATIVE
Glucose, UA: NEGATIVE mg/dL
Ketones, ur: NEGATIVE mg/dL
Nitrite: NEGATIVE
Protein, ur: 100 mg/dL — AB
Specific Gravity, Urine: 1.025 (ref 1.005–1.030)
pH: 5.5 (ref 5.0–8.0)

## 2021-02-13 LAB — URINALYSIS, MICROSCOPIC (REFLEX): WBC, UA: >50 WBC/hpf (ref 0–5)

## 2021-02-13 MED ORDER — SODIUM CHLORIDE 0.9 % IV BOLUS
1000.0000 mL | Freq: Once | INTRAVENOUS | Status: AC
  Administered 2021-02-14: 01:00:00 1000 mL via INTRAVENOUS

## 2021-02-13 MED ORDER — SODIUM CHLORIDE 0.9 % IV SOLN
1.0000 g | Freq: Once | INTRAVENOUS | Status: AC
  Administered 2021-02-14: 01:00:00 1 g via INTRAVENOUS
  Filled 2021-02-13: qty 10

## 2021-02-13 NOTE — ED Provider Notes (Signed)
Emergency Medicine Provider Triage Evaluation Note  Tyler Lester , a 75 y.o. male  was evaluated in triage.  Pt complains of past medical history of Parkinson's disease.  He complains of weakness in his upper and lower extremities, bilateral hip pain, pain into his groin bilaterally.  He states that he has been losing grip strength and dropping things.  He is taking Sinemet as directed.  Review of Systems  Positive: Weakness Negative: Fever  Physical Exam  BP 101/73 (BP Location: Right Arm)    Pulse 79    Temp 97.8 F (36.6 C) (Oral)    Resp 18    SpO2 99%  Gen:   Awake, no distress   Resp:  Normal effort  MSK:   Moves extremities without difficulty  Other:  Bilateral upper extremity weakness, cogwheel rigidity  Medical Decision Making  Medically screening exam initiated at 7:08 PM.  Appropriate orders placed.  Glenetta Borg was informed that the remainder of the evaluation will be completed by another provider, this initial triage assessment does not replace that evaluation, and the importance of remaining in the ED until their evaluation is complete.  Work-up initiated   Margarita Mail, PA-C 02/13/21 1909    Truddie Hidden, MD 02/13/21 2237

## 2021-02-13 NOTE — ED Triage Notes (Signed)
Pt here via [facility] from home, pt had mechanical fall earlier in the day, denied transport to hospital but pain wasn't going away. Pt has R knee and R hip pain. Pt has parkinson's and stated he has been getting more weak over the last few weeks. VSS, aox4

## 2021-02-13 NOTE — ED Notes (Signed)
Patient in scans at this time

## 2021-02-13 NOTE — ED Provider Notes (Signed)
[facility] EMERGENCY DEPARTMENT Provider Note   CSN: [id number] Arrival date & time: 02/13/21  1743     History Chief Complaint  Patient presents with   Fall   Weakness    Tyler Lester is a 75 y.o. male.  Patient presents to the emergency department with a chief complaint of generalized weakness and fall.  He states that he has Parkinson's and has trouble keeping his balance.  He states that he had a fall today and complains of knee and hip pain.  He also states that he has been feeling more weak than normal.  He denies any fever, chills, or cough.  He states that he was treated for a UTI about a month or so ago, but has finished his antibiotics.  He states that he still has some "heat" with urination.  He is not currently being treated for UTI.  The history is provided by the patient. No language interpreter was used.      Past Medical History:  Diagnosis Date   Bilateral foot pain    Chronic pain    Diabetes mellitus    type 2, diet controlled now   Foley catheter in place since nov 2018, last changed 1 week ago   Hyperlipidemia    Hypertension    Inguinal hernia    right   Numbness    T/O   Parkinson disease ([place]) 08/26/2015   Followed by [facility]. Last visit 06/03/2015. History of poor compliance with his medication. Referred him back to [place] in 10/2016. They didn't want to see him back due to poor compliance with medical advise. 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   Diminished hearing, left 01/20/2016   Type 2 diabetes mellitus with complication ([place]) 15/17/6160   Gait abnormality 03/28/2015   Postlaminectomy syndrome, cervical region 07/23/2014   Parkinson's disease ([place]) 07/23/2014   Back pain with sciatica 05/27/2014   Bilateral leg pain 05/14/2014   Depression 10/15/2013   CKD (chronic kidney disease) stage 3, GFR 30-59 ml/min ([facility]CC) 08/25/2013   Tremor 05/23/2013   Acute renal failure superimposed on stage 3 chronic kidney disease ([place]) 05/08/2013   Chronic diastolic heart failure ([place]) 05/06/2013   Onychogryposis of toenail 05/14/2012   GERD (gastroesophageal reflux disease) 07/04/2011   Essential hypertension 01/05/2011    Past Surgical History:  Procedure Laterality Date   CATARACT EXTRACTION W/ INTRAOCULAR LENS IMPLANT Left 06/2012   laser for posterior capsule?   COLON SURGERY     POSTERIOR CERVICAL FUSION/FORAMINOTOMY N/A 11/12/2013   Procedure: Posterior cervical decompression fusion, cervical 3-4, cervical 4-5, cervical 5-6, cervical 6-7 with instrumentation and allograft   (LEVEL 4);  Surgeon: Sinclair Ship, MD;  Location: [facility];  Service: Orthopedics;  Laterality: N/A;  Posterior cervical decompression fusion, cervical 3-4, cervical 4-5, cervical 5-6, cervical 6-7 with instrumentation and allograft   TONSILLECTOMY     XI ROBOTIC ASSISTED SIMPLE  PROSTATECTOMY N/A 03/07/2016   Procedure: XI ROBOTIC ASSISTED SIMPLE PROSTATECTOMY AND UMBILICAL HERNIA REPAIR flexible cystoscopy;  Surgeon: Alexis Frock, MD;  Location: [facility];  Service: Urology;  Laterality: N/A;       Family History  Problem Relation Age of Onset   Cancer - Other Other    Diabetes Other    Hypertension Other     Social History   Tobacco Use   Smoking status: Never   Smokeless tobacco: Never  Vaping Use   Vaping Use: Never used  Substance Use Topics   Alcohol use: Yes     Alcohol/week: 0.0 standard drinks    Comment: 1 shot a day   Drug use: No    Home Medications Prior to Admission medications   Medication Sig Start Date End Date Taking? Authorizing Provider  allopurinol (ZYLOPRIM) 300 MG tablet Take 300 mg by mouth daily. 02/21/20   [provider]  amantadine (SYMMETREL) 100 MG capsule Take 100 mg by mouth 2 (two) times daily. 09/29/20   [provider]  amLODipine (NORVASC) 5 MG tablet Take 1 tablet (5 mg total) by mouth daily. 03/04/20 10/13/20  Kayleen Memos, DO  benzonatate (TESSALON) 100 MG capsule Take 1 capsule (100 mg total) by mouth every 8 (eight) hours. 10/27/20   Raspet, Erin K, PA-C  carbidopa-levodopa (SINEMET IR) 25-250 MG tablet Take 1 tablet by mouth 4 (four) times daily.    [provider]  [facility] MELATONIN 5 MG TABS Take 5 mg by mouth at bedtime as needed (sleep). 09/02/20   [provider]  cyclobenzaprine (FLEXERIL) 5 MG tablet Take 5 mg by mouth 3 (three) times daily as needed for muscle spasms. 02/05/20   [provider]  diclofenac Sodium (VOLTAREN) 1 % GEL Apply 2-4 g topically daily as needed. Legs, hips 10/04/20   [provider]  donepezil (ARICEPT) 10 MG tablet Take 1 tablet by mouth every day Patient taking differently: Take 10 mg by mouth daily. 12/15/19   Wilber Oliphant, MD  gabapentin (NEURONTIN) 600 MG tablet Take 600 mg by mouth at bedtime as needed (pain). 10/04/20   [provider]  metFORMIN (GLUCOPHAGE) 500 MG tablet Take 500 mg by mouth daily. 09/05/20   [provider]  naloxone [facility]) nasal spray 4 mg/0.1 mL Place 4 mg into the nose as needed. 10/04/20   [provider]  omeprazole (PRILOSEC) 40 MG capsule Take 40 mg by mouth 2 (two) times daily. 09/10/20   [provider]  ondansetron (ZOFRAN ODT) 4 MG disintegrating tablet Take 1 tablet (4 mg total) by mouth every 8 (eight) hours as needed for nausea or vomiting. 04/18/20   Sharion Balloon, NP   oxyCODONE-acetaminophen (PERCOCET) 7.5-325 MG tablet Take 1 tablet by mouth 4 (four) times daily as needed for moderate pain. 09/10/19   [provider]  Vitamin D, Ergocalciferol, (DRISDOL) 1.25 MG (50000 UNIT) CAPS capsule Take 50,000 Units by mouth every Tuesday. 02/04/20   [provider]    Allergies    Poison ivy treatments  Review of Systems   Review of Systems  All other systems reviewed and are negative.  Physical Exam Updated Vital Signs BP 116/71 (BP Location: Left Arm)    Pulse 78    Temp 97.8 F (36.6 C) (Oral)    Resp 16    Ht 5\' 9"  (1.753 m)    Wt 59 kg    SpO2 100%    BMI 19.20 kg/m  Physical Exam Vitals and nursing note reviewed.  Constitutional:      General: He is not in acute distress.    Appearance: He is well-developed.  HENT:     Head: Normocephalic and atraumatic.  Eyes:     Conjunctiva/sclera: Conjunctivae normal.  Cardiovascular:     Rate and Rhythm: Normal rate and regular rhythm.     Heart sounds: No murmur heard. Pulmonary:     Effort: Pulmonary effort is normal. No respiratory distress.     Breath sounds: Normal breath sounds.  Abdominal:     Palpations: Abdomen is soft.     Tenderness: There is no abdominal tenderness.  Musculoskeletal:        General: No swelling.     Cervical back: Neck supple.  Skin:    General: Skin is warm and dry.     Capillary Refill: Capillary refill takes less than 2 seconds.  Neurological:     Mental Status: He is alert.  Psychiatric:        Mood and Affect: Mood normal.    ED Results / Procedures / Treatments   Labs (all labs ordered are listed, but only abnormal results are displayed) Labs Reviewed  COMPREHENSIVE METABOLIC PANEL - Abnormal; Notable for the following components:      Result Value   Potassium 5.9 (*)    Glucose, Bld 113 (*)    BUN 36 (*)    Creatinine, Ser 2.40 (*)    AST 13 (*)    GFR, Estimated 28 (*)    All other components within normal limits  URINALYSIS,  ROUTINE W REFLEX MICROSCOPIC - Abnormal; Notable for the following components:   APPearance TURBID (*)    Hgb urine dipstick TRACE (*)    Protein, ur 100 (*)    Leukocytes,Ua LARGE (*)    All other components within normal limits  URINALYSIS, MICROSCOPIC (REFLEX) - Abnormal; Notable for the following components:   Bacteria, UA MANY (*)    Non Squamous Epithelial PRESENT (*)    All other components within normal limits  RESP PANEL BY RT-PCR (FLU A&B, COVID) ARPGX2  CBC WITH DIFFERENTIAL/PLATELET    EKG None  Radiology DG Lumbar Spine Complete  Result Date: 02/13/2021 CLINICAL DATA:  Back pain. EXAM: LUMBAR SPINE - COMPLETE 4+ VIEW COMPARISON:  None. FINDINGS: There is no evidence of lumbar spine fracture. Alignment is normal. Mild multilevel degenerative endplate changes are noted from L3-S1. There are disc herniations with calcification of the disc annulus at L3-L4 and L4-L5. IMPRESSION: 1. No acute fracture or dislocation. 2. Multilevel degenerative changes. Electronically Signed   By: Brett Fairy M.D.   On: 02/13/2021 20:36   CT Cervical Spine Wo Contrast  Result Date: 02/13/2021 CLINICAL DATA:  Cervical radiculopathy, mechanical fall earlier today. EXAM: CT CERVICAL SPINE WITHOUT CONTRAST TECHNIQUE: Multidetector CT imaging of the cervical spine was performed without intravenous contrast. Multiplanar CT image reconstructions were also generated. COMPARISON:  03/02/2020. FINDINGS: Alignment: Normal. Skull base and vertebrae: No acute fracture. Posterior cervical spinal fusion hardware is present from C3-C6 with laminectomy changes. There is no evidence of hardware loosening. Soft tissues and spinal canal: Evaluation is limited due the presence of hardware artifact. No prevertebral fluid or swelling. No visible canal hematoma. Disc levels: Multilevel intervertebral disc space narrowing, degenerative endplate changes and facet arthropathy is noted, not well evaluated due to hardware  artifact. Upper chest: Negative. Other: Atherosclerotic calcification of the carotid arteries IMPRESSION: . 1. No acute fracture. 2. Posterior cervical  spinal fusion hardware from C3-C6 with no evidence of hardware loosening. 3. Multilevel degenerative changes. Limited evaluation of the spinal canal due to hardware artifact. Electronically Signed   By: Brett Fairy M.D.   On: 02/13/2021 21:27   DG Hip Unilat W or Wo Pelvis 2-3 Views Left  Result Date: 02/13/2021 CLINICAL DATA:  Bilateral hip pain. EXAM: DG HIP (WITH OR WITHOUT PELVIS) 2-3V RIGHT; DG HIP (WITH OR WITHOUT PELVIS) 2-3V LEFT COMPARISON:  Radiograph dated 08/21/2019. FINDINGS: No acute fracture or dislocation. The bones are well mineralized. Mild bilateral hip arthritic changes, left greater right. The soft tissues are unremarkable. Vascular calcifications noted. IMPRESSION: Negative. Electronically Signed   By: Anner Crete M.D.   On: 02/13/2021 20:37   DG Hip Unilat W or Wo Pelvis 2-3 Views Right  Result Date: 02/13/2021 CLINICAL DATA:  Bilateral hip pain. EXAM: DG HIP (WITH OR WITHOUT PELVIS) 2-3V RIGHT; DG HIP (WITH OR WITHOUT PELVIS) 2-3V LEFT COMPARISON:  Radiograph dated 08/21/2019. FINDINGS: No acute fracture or dislocation. The bones are well mineralized. Mild bilateral hip arthritic changes, left greater right. The soft tissues are unremarkable. Vascular calcifications noted. IMPRESSION: Negative. Electronically Signed   By: Anner Crete M.D.   On: 02/13/2021 20:37    Procedures Procedures   Medications Ordered in ED Medications - No data to display  ED Course  I have reviewed the triage vital signs and the nursing notes.  Pertinent labs & imaging results that were available during my care of the patient were reviewed by me and considered in my medical decision making (see chart for details).    MDM Rules/Calculators/A&P                         Patient here with generalized weakness and fall today.  Imaging  studies ordered in triage are negative.  No acute traumatic findings.  Patient is moving all of his extremities.  I suspect that his fall was secondary to his Parkinson's, and patient admits that he does have trouble keeping his balance.  Laboratory work-up is notable for what appears to be some signs of dehydration.  His K is noted to be 5.9, and his creatinine is increased to 2.3.  He was admitted back in August for dehydration when his creatinine was in the 4s.  Overall, patient is generally well-appearing.  I will give him a liter of fluid for rehydration along with some antibiotics for his abnormal urinalysis and presumed persistent UTI.  We will send urine for culture.  We will recheck labs after fluids.  If K is trending down and patient is feeling improved, feel that he can be safely discharged.  Patient seen by discussed with Dr. Dina Rich, who agrees with this plan.  Will reevaluate after fluids.  Patient is able to ambulate with her walker.  His potassium is rechecked and is 5.5, though still with some hemolysis.  Given the hemolysis, I believe this is still falsely elevated and that the patient can be safely discharged.  I discussed the plan with the patient, who is agreeable.      Final Clinical Impression(s) / ED Diagnoses Final diagnoses:  Acute cystitis without hematuria    Rx / DC Orders ED Discharge Orders     None        Montine Circle, PA-C 02/14/21 0424    Merryl Hacker, MD 02/14/21 [phone]

## 2021-02-14 LAB — BASIC METABOLIC PANEL
Anion gap: 6 (ref 5–15)
BUN: 35 mg/dL — ABNORMAL HIGH (ref 8–23)
CO2: 26 mmol/L (ref 22–32)
Calcium: 8.6 mg/dL — ABNORMAL LOW (ref 8.9–10.3)
Chloride: 107 mmol/L (ref 98–111)
Creatinine, Ser: 2.19 mg/dL — ABNORMAL HIGH (ref 0.61–1.24)
GFR, Estimated: 31 mL/min — ABNORMAL LOW (ref >60)
Glucose, Bld: 80 mg/dL (ref 70–99)
Potassium: 6.1 mmol/L — ABNORMAL HIGH (ref 3.5–5.1)
Sodium: 139 mmol/L (ref 135–145)

## 2021-02-14 LAB — RESP PANEL BY RT-PCR (FLU A&B, COVID) ARPGX2
Influenza A by PCR: NEGATIVE
Influenza B by PCR: NEGATIVE
SARS Coronavirus 2 by RT PCR: NEGATIVE

## 2021-02-14 LAB — POTASSIUM: Potassium: 5.5 mmol/L — ABNORMAL HIGH (ref 3.5–5.1)

## 2021-02-14 MED ORDER — CEPHALEXIN 500 MG PO CAPS: 500.0000 mg | ORAL_CAPSULE | Freq: Two times a day (BID) | ORAL | 0 refills | Status: DC

## 2021-02-14 NOTE — ED Notes (Signed)
Changed patients brief

## 2021-02-14 NOTE — ED Notes (Signed)
Pt ambulated with steady gait in hallway with walker. Oxygen saturations remained between 95-100%

## 2021-02-17 ENCOUNTER — Inpatient Hospital Stay (HOSPITAL_COMMUNITY)
Admission: EM | Admit: 2021-02-17 | Discharge: 2021-02-21 | DRG: 872 | Disposition: A | Discharge status: Home Health | Payer: Medicare Other | Attending: Osteopathic Medicine | Admitting: Osteopathic Medicine

## 2021-02-17 ENCOUNTER — Other Ambulatory Visit: Payer: Self-pay

## 2021-02-17 ENCOUNTER — Encounter (HOSPITAL_COMMUNITY): Payer: Self-pay | Admitting: Family Medicine

## 2021-02-17 ENCOUNTER — Emergency Department (HOSPITAL_COMMUNITY): Payer: Medicare Other

## 2021-02-17 ENCOUNTER — Observation Stay (HOSPITAL_COMMUNITY): Payer: Medicare Other

## 2021-02-17 DIAGNOSIS — Z79899 Other long term (current) drug therapy: Secondary | ICD-10-CM

## 2021-02-17 DIAGNOSIS — G8929 Other chronic pain: Secondary | ICD-10-CM | POA: Diagnosis present

## 2021-02-17 DIAGNOSIS — R338 Other retention of urine: Secondary | ICD-10-CM | POA: Diagnosis present

## 2021-02-17 DIAGNOSIS — I130 Hypertensive heart and chronic kidney disease with heart failure and stage 1 through stage 4 chronic kidney disease, or unspecified chronic kidney disease: Secondary | ICD-10-CM | POA: Diagnosis present

## 2021-02-17 DIAGNOSIS — H9192 Unspecified hearing loss, left ear: Secondary | ICD-10-CM | POA: Diagnosis present

## 2021-02-17 DIAGNOSIS — Z888 Allergy status to other drugs, medicaments and biological substances status: Secondary | ICD-10-CM

## 2021-02-17 DIAGNOSIS — N400 Enlarged prostate without lower urinary tract symptoms: Secondary | ICD-10-CM | POA: Diagnosis present

## 2021-02-17 DIAGNOSIS — E1122 Type 2 diabetes mellitus with diabetic chronic kidney disease: Secondary | ICD-10-CM | POA: Diagnosis present

## 2021-02-17 DIAGNOSIS — Z981 Arthrodesis status: Secondary | ICD-10-CM

## 2021-02-17 DIAGNOSIS — Z20822 Contact with and (suspected) exposure to covid-19: Secondary | ICD-10-CM | POA: Diagnosis present

## 2021-02-17 DIAGNOSIS — Z79890 Hormone replacement therapy (postmenopausal): Secondary | ICD-10-CM

## 2021-02-17 DIAGNOSIS — G20 Parkinson's disease: Secondary | ICD-10-CM | POA: Diagnosis present

## 2021-02-17 DIAGNOSIS — E118 Type 2 diabetes mellitus with unspecified complications: Secondary | ICD-10-CM | POA: Diagnosis present

## 2021-02-17 DIAGNOSIS — N1832 Chronic kidney disease, stage 3b: Secondary | ICD-10-CM | POA: Diagnosis present

## 2021-02-17 DIAGNOSIS — G20A1 Parkinson's disease without dyskinesia, without mention of fluctuations: Secondary | ICD-10-CM | POA: Diagnosis present

## 2021-02-17 DIAGNOSIS — E875 Hyperkalemia: Secondary | ICD-10-CM | POA: Diagnosis present

## 2021-02-17 DIAGNOSIS — A419 Sepsis, unspecified organism: Principal | ICD-10-CM | POA: Diagnosis present

## 2021-02-17 DIAGNOSIS — I10 Essential (primary) hypertension: Secondary | ICD-10-CM | POA: Diagnosis present

## 2021-02-17 DIAGNOSIS — I5032 Chronic diastolic (congestive) heart failure: Secondary | ICD-10-CM | POA: Diagnosis present

## 2021-02-17 DIAGNOSIS — E1142 Type 2 diabetes mellitus with diabetic polyneuropathy: Secondary | ICD-10-CM | POA: Diagnosis present

## 2021-02-17 DIAGNOSIS — Z8701 Personal history of pneumonia (recurrent): Secondary | ICD-10-CM

## 2021-02-17 DIAGNOSIS — R6520 Severe sepsis without septic shock: Secondary | ICD-10-CM | POA: Diagnosis not present

## 2021-02-17 DIAGNOSIS — Z7984 Long term (current) use of oral hypoglycemic drugs: Secondary | ICD-10-CM

## 2021-02-17 DIAGNOSIS — E785 Hyperlipidemia, unspecified: Secondary | ICD-10-CM | POA: Diagnosis present

## 2021-02-17 DIAGNOSIS — G4700 Insomnia, unspecified: Secondary | ICD-10-CM | POA: Diagnosis present

## 2021-02-17 DIAGNOSIS — Z8249 Family history of ischemic heart disease and other diseases of the circulatory system: Secondary | ICD-10-CM

## 2021-02-17 DIAGNOSIS — N390 Urinary tract infection, site not specified: Secondary | ICD-10-CM | POA: Diagnosis present

## 2021-02-17 DIAGNOSIS — Z87440 Personal history of urinary (tract) infections: Secondary | ICD-10-CM

## 2021-02-17 DIAGNOSIS — Z66 Do not resuscitate: Secondary | ICD-10-CM | POA: Diagnosis present

## 2021-02-17 DIAGNOSIS — N401 Enlarged prostate with lower urinary tract symptoms: Secondary | ICD-10-CM | POA: Diagnosis present

## 2021-02-17 DIAGNOSIS — M549 Dorsalgia, unspecified: Secondary | ICD-10-CM | POA: Diagnosis present

## 2021-02-17 DIAGNOSIS — A0472 Enterocolitis due to Clostridium difficile, not specified as recurrent: Secondary | ICD-10-CM | POA: Diagnosis present

## 2021-02-17 DIAGNOSIS — N183 Chronic kidney disease, stage 3 unspecified: Secondary | ICD-10-CM | POA: Diagnosis present

## 2021-02-17 DIAGNOSIS — K219 Gastro-esophageal reflux disease without esophagitis: Secondary | ICD-10-CM | POA: Diagnosis present

## 2021-02-17 DIAGNOSIS — E860 Dehydration: Secondary | ICD-10-CM | POA: Diagnosis present

## 2021-02-17 DIAGNOSIS — N179 Acute kidney failure, unspecified: Secondary | ICD-10-CM | POA: Diagnosis not present

## 2021-02-17 DIAGNOSIS — W19XXXA Unspecified fall, initial encounter: Secondary | ICD-10-CM

## 2021-02-17 DIAGNOSIS — R627 Adult failure to thrive: Secondary | ICD-10-CM | POA: Diagnosis present

## 2021-02-17 DIAGNOSIS — R296 Repeated falls: Secondary | ICD-10-CM | POA: Diagnosis present

## 2021-02-17 DIAGNOSIS — R64 Cachexia: Secondary | ICD-10-CM | POA: Diagnosis present

## 2021-02-17 DIAGNOSIS — Z833 Family history of diabetes mellitus: Secondary | ICD-10-CM

## 2021-02-17 DIAGNOSIS — F02818 Dementia in other diseases classified elsewhere, unspecified severity, with other behavioral disturbance: Secondary | ICD-10-CM | POA: Diagnosis present

## 2021-02-17 LAB — URINALYSIS, MICROSCOPIC (REFLEX)
RBC / HPF: >50 RBC/hpf (ref 0–5)
Squamous Epithelial / HPF: NONE SEEN (ref 0–5)

## 2021-02-17 LAB — URINALYSIS, ROUTINE W REFLEX MICROSCOPIC
Bilirubin Urine: NEGATIVE
Glucose, UA: 100 mg/dL — AB
Ketones, ur: NEGATIVE mg/dL
Nitrite: NEGATIVE
Protein, ur: 30 mg/dL — AB
Specific Gravity, Urine: 1.02 (ref 1.005–1.030)
pH: 5.5 (ref 5.0–8.0)

## 2021-02-17 LAB — I-STAT CHEM 8, ED
BUN: 45 mg/dL — ABNORMAL HIGH (ref 8–23)
Calcium, Ion: 1.01 mmol/L — ABNORMAL LOW (ref 1.15–1.40)
Chloride: 113 mmol/L — ABNORMAL HIGH (ref 98–111)
Creatinine, Ser: 3.2 mg/dL — ABNORMAL HIGH (ref 0.61–1.24)
Glucose, Bld: 128 mg/dL — ABNORMAL HIGH (ref 70–99)
HCT: 53 % — ABNORMAL HIGH (ref 39.0–52.0)
Hemoglobin: 18 g/dL — ABNORMAL HIGH (ref 13.0–17.0)
Potassium: 6 mmol/L — ABNORMAL HIGH (ref 3.5–5.1)
Sodium: 140 mmol/L (ref 135–145)
TCO2: 21 mmol/L — ABNORMAL LOW (ref 22–32)

## 2021-02-17 LAB — CBG MONITORING, ED
Glucose-Capillary: 79 mg/dL (ref 70–99)
Glucose-Capillary: 97 mg/dL (ref 70–99)

## 2021-02-17 LAB — CBC WITH DIFFERENTIAL/PLATELET
Abs Immature Granulocytes: 0.29 10*3/uL — ABNORMAL HIGH (ref 0.00–0.07)
Basophils Absolute: 0.1 10*3/uL (ref 0.0–0.1)
Basophils Relative: 0 %
Eosinophils Absolute: 0 10*3/uL (ref 0.0–0.5)
Eosinophils Relative: 0 %
HCT: 50.2 % (ref 39.0–52.0)
Hemoglobin: 15.3 g/dL (ref 13.0–17.0)
Immature Granulocytes: 1 %
Lymphocytes Relative: 2 %
Lymphs Abs: 0.4 10*3/uL — ABNORMAL LOW (ref 0.7–4.0)
MCH: 28.4 pg (ref 26.0–34.0)
MCHC: 30.5 g/dL (ref 30.0–36.0)
MCV: 93.3 fL (ref 80.0–100.0)
Monocytes Absolute: 1.2 10*3/uL — ABNORMAL HIGH (ref 0.1–1.0)
Monocytes Relative: 4 %
Neutro Abs: 24.5 10*3/uL — ABNORMAL HIGH (ref 1.7–7.7)
Neutrophils Relative %: 93 %
Platelets: 232 10*3/uL (ref 150–400)
RBC: 5.38 MIL/uL (ref 4.22–5.81)
RDW: 14.3 % (ref 11.5–15.5)
WBC: 26.4 10*3/uL — ABNORMAL HIGH (ref 4.0–10.5)
nRBC: 0 % (ref 0.0–0.2)

## 2021-02-17 LAB — COMPREHENSIVE METABOLIC PANEL
ALT: 7 U/L (ref 0–44)
AST: 21 U/L (ref 15–41)
Albumin: 3.3 g/dL — ABNORMAL LOW (ref 3.5–5.0)
Alkaline Phosphatase: 55 U/L (ref 38–126)
Anion gap: 9 (ref 5–15)
BUN: 37 mg/dL — ABNORMAL HIGH (ref 8–23)
CO2: 23 mmol/L (ref 22–32)
Calcium: 9.3 mg/dL (ref 8.9–10.3)
Chloride: 105 mmol/L (ref 98–111)
Creatinine, Ser: 3.08 mg/dL — ABNORMAL HIGH (ref 0.61–1.24)
GFR, Estimated: 21 mL/min — ABNORMAL LOW (ref >60)
Glucose, Bld: 152 mg/dL — ABNORMAL HIGH (ref 70–99)
Potassium: 6.1 mmol/L — ABNORMAL HIGH (ref 3.5–5.1)
Sodium: 137 mmol/L (ref 135–145)
Total Bilirubin: 0.7 mg/dL (ref 0.3–1.2)
Total Protein: 7.7 g/dL (ref 6.5–8.1)

## 2021-02-17 LAB — GLUCOSE, CAPILLARY: Glucose-Capillary: 115 mg/dL — ABNORMAL HIGH (ref 70–99)

## 2021-02-17 LAB — CK: Total CK: 821 U/L — ABNORMAL HIGH (ref 49–397)

## 2021-02-17 LAB — PROTIME-INR
INR: 1.1 (ref 0.8–1.2)
Prothrombin Time: 14.1 seconds (ref 11.4–15.2)

## 2021-02-17 LAB — LACTIC ACID, PLASMA
Lactic Acid, Venous: 2.3 mmol/L (ref 0.5–1.9)
Lactic Acid, Venous: 1.2 mmol/L (ref 0.5–1.9)

## 2021-02-17 LAB — APTT: aPTT: 28 seconds (ref 24–36)

## 2021-02-17 LAB — RESP PANEL BY RT-PCR (FLU A&B, COVID) ARPGX2
Influenza A by PCR: NEGATIVE
Influenza B by PCR: NEGATIVE
SARS Coronavirus 2 by RT PCR: NEGATIVE

## 2021-02-17 MED ORDER — ACETAMINOPHEN 650 MG RE SUPP: 650.0000 mg | Freq: Four times a day (QID) | RECTAL | Status: DC | PRN

## 2021-02-17 MED ORDER — ACETAMINOPHEN 325 MG PO TABS
650.0000 mg | ORAL_TABLET | Freq: Four times a day (QID) | ORAL | Status: DC | PRN
  Administered 2021-02-17 – 2021-02-19 (×2): 650 mg via ORAL
  Filled 2021-02-17 (×2): qty 2

## 2021-02-17 MED ORDER — ENOXAPARIN SODIUM 30 MG/0.3ML IJ SOSY
30.0000 mg | PREFILLED_SYRINGE | INTRAMUSCULAR | Status: DC
  Administered 2021-02-17 – 2021-02-19 (×3): 30 mg via SUBCUTANEOUS
  Filled 2021-02-17 (×4): qty 0.3

## 2021-02-17 MED ORDER — VANCOMYCIN VARIABLE DOSE PER UNSTABLE RENAL FUNCTION (PHARMACIST DOSING): Status: DC

## 2021-02-17 MED ORDER — LACTATED RINGERS IV BOLUS (SEPSIS)
1000.0000 mL | Freq: Once | INTRAVENOUS | Status: AC
  Administered 2021-02-17: 12:00:00 1000 mL via INTRAVENOUS

## 2021-02-17 MED ORDER — SODIUM ZIRCONIUM CYCLOSILICATE 10 G PO PACK
10.0000 g | PACK | Freq: Once | ORAL | Status: AC
  Administered 2021-02-17: 12:00:00 10 g via ORAL
  Filled 2021-02-17: qty 1

## 2021-02-17 MED ORDER — ACETAMINOPHEN 325 MG PO TABS
650.0000 mg | ORAL_TABLET | Freq: Once | ORAL | Status: AC
  Administered 2021-02-17: 12:00:00 650 mg via ORAL
  Filled 2021-02-17: qty 2

## 2021-02-17 MED ORDER — DEXTROSE 50 % IV SOLN
1.0000 | Freq: Once | INTRAVENOUS | Status: AC
  Administered 2021-02-17: 12:00:00 50 mL via INTRAVENOUS
  Filled 2021-02-17: qty 50

## 2021-02-17 MED ORDER — SODIUM CHLORIDE 0.9 % IV SOLN
2.0000 g | Freq: Once | INTRAVENOUS | Status: AC
  Administered 2021-02-17: 12:00:00 2 g via INTRAVENOUS
  Filled 2021-02-17: qty 2

## 2021-02-17 MED ORDER — OXYCODONE HCL 5 MG PO TABS
10.0000 mg | ORAL_TABLET | Freq: Four times a day (QID) | ORAL | Status: DC | PRN
  Administered 2021-02-19 – 2021-02-20 (×3): 10 mg via ORAL
  Filled 2021-02-17 (×3): qty 2

## 2021-02-17 MED ORDER — VANCOMYCIN HCL 1250 MG/250ML IV SOLN
1250.0000 mg | Freq: Once | INTRAVENOUS | Status: AC
  Administered 2021-02-17: 15:00:00 1250 mg via INTRAVENOUS
  Filled 2021-02-17: qty 250

## 2021-02-17 MED ORDER — METRONIDAZOLE 500 MG/100ML IV SOLN
500.0000 mg | Freq: Once | INTRAVENOUS | Status: AC
  Administered 2021-02-17: 12:00:00 500 mg via INTRAVENOUS
  Filled 2021-02-17: qty 100

## 2021-02-17 MED ORDER — LACTATED RINGERS IV BOLUS
1000.0000 mL | Freq: Once | INTRAVENOUS | Status: AC
  Administered 2021-02-17: 15:00:00 1000 mL via INTRAVENOUS

## 2021-02-17 MED ORDER — INSULIN ASPART 100 UNIT/ML IJ SOLN: 0.0000 [IU] | Freq: Three times a day (TID) | INTRAMUSCULAR | Status: DC

## 2021-02-17 MED ORDER — CHLORHEXIDINE GLUCONATE CLOTH 2 % EX PADS
6.0000 | MEDICATED_PAD | Freq: Every day | CUTANEOUS | Status: DC
  Administered 2021-02-18 – 2021-02-21 (×4): 6 via TOPICAL

## 2021-02-17 MED ORDER — LACTATED RINGERS IV SOLN: INTRAVENOUS | Status: AC

## 2021-02-17 MED ORDER — CEFEPIME HCL 2 G IJ SOLR
2.0000 g | INTRAMUSCULAR | Status: DC
Start: 2021-02-18 — End: 2021-02-18
  Administered 2021-02-18: 2 g via INTRAVENOUS
  Filled 2021-02-17: qty 2

## 2021-02-17 MED ORDER — OXYCODONE HCL 5 MG PO TABS: 5.0000 mg | ORAL_TABLET | ORAL | Status: DC | PRN

## 2021-02-17 MED ORDER — INSULIN ASPART 100 UNIT/ML IV SOLN
5.0000 [IU] | Freq: Once | INTRAVENOUS | Status: AC
  Administered 2021-02-17: 12:00:00 5 [IU] via INTRAVENOUS
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
## 2021-02-17 NOTE — Assessment & Plan Note (Signed)
Recent A1c of 6.1 in August 2022 Hold metformin in setting of acute on CKD and would px alternative diabetic medication with CKD SSI and accuchecks per protocol

## 2021-02-17 NOTE — ED Notes (Signed)
Attempted report x1. 

## 2021-02-17 NOTE — Assessment & Plan Note (Signed)
Hold home BP medication (norvasc) with hypotension with sepsis

## 2021-02-17 NOTE — Evaluation (Signed)
Physical Therapy Evaluation Patient Details Name: Tyler Lester MRN: [id number] DOB: May 23, 1945 Today's Date: 02/17/2021  History of Present Illness  KRISHANG READING is a 75 y.o. male with medical history significant of PD, HTN, CKD stage III, S9QZ, chronic diastolic CHF, BPH with urinary retention admitted after mechanical fall with recent weakness and worsening tremor after treated for UTI 12/19 in ED with fall and weakness.  Found to be septic.  Clinical Impression  Patient presents with decreased mobility due to weakness, decreased balance, decreased activity tolerance and recent falls at home.  Currently mod A overall for in room mobility and limtied by fatigue.  He has an aide during the day but no help in the evenings.  Patient will benefit from skilled PT in the acute setting and will need STSNF level rehab at d/c.        Recommendations for follow up therapy are one component of a multi-disciplinary discharge planning process, led by the attending physician.  Recommendations may be updated based on patient status, additional functional criteria and insurance authorization.  Follow Up Recommendations Skilled nursing-short term rehab (<3 hours/day)    Assistance Recommended at Discharge Frequent or constant Supervision/Assistance  Functional Status Assessment Patient has had a recent decline in their functional status and demonstrates the ability to make significant improvements in function in a reasonable and predictable amount of time.  Equipment Recommendations  None recommended by PT    Recommendations for Other Services       Precautions / Restrictions Precautions Precautions: Fall      Mobility  Bed Mobility Overal bed mobility: Needs Assistance Bed Mobility: Supine to Sit;Sit to Supine     Supine to sit: Mod assist;HOB elevated Sit to supine: Mod assist   General bed mobility comments: assist for legs off bed and trunk upright, to supine assist for legs with cues  for using UE's for return to sidelying    Transfers Overall transfer level: Needs assistance Equipment used: Rolling walker (2 wheels) Transfers: Sit to/from Stand Sit to Stand: Min assist;Mod assist           General transfer comment: up from EOB min A for balance, up from arrmchair mod lifting assist to stand    Ambulation/Gait Ambulation/Gait assistance: Min assist Gait Distance (Feet): 12 Feet (x 2) Assistive device: Rolling walker (2 wheels) Gait Pattern/deviations: Step-through pattern;Decreased stride length;Shuffle;Trunk flexed       General Gait Details: flexed posture throughout, initially with good control with RW around bed to sit in armchair, then after prolonged standing for assist with changing brief and hygiene noted increased flexed posture and increased speed needing cues and assist for safety  Stairs            Wheelchair Mobility    Modified Rankin (Stroke Patients Only)       Balance Overall balance assessment: Needs assistance Sitting-balance support: Feet supported Sitting balance-Leahy Scale: Fair     Standing balance support: Bilateral upper extremity supported Standing balance-Leahy Scale: Poor Standing balance comment: stood about 2-3 minutes while PT assisting for hygiene and changing brief due to incontinent of BM                             Pertinent Vitals/Pain Pain Assessment: 0-10 Pain Score: 8  Pain Location: hips and back Pain Descriptors / Indicators: Aching;Grimacing Pain Intervention(s): Monitored during session;Repositioned    Home Living Family/patient expects to be discharged to:: Skilled nursing  facility                        Prior Function Prior Level of Function : Needs assist       Physical Assist : ADLs (physical)   ADLs (physical): Bathing;Dressing;IADLs Mobility Comments: has an aide daily and brother comes at times to check on him       Hand Dominance         Extremity/Trunk Assessment   Upper Extremity Assessment Upper Extremity Assessment: RUE deficits/detail;LUE deficits/detail RUE Deficits / Details: strength 4/5, but tremors present RUE Coordination: decreased fine motor LUE Deficits / Details: strength 4/5, but tremors present LUE Coordination: decreased fine motor    Lower Extremity Assessment Lower Extremity Assessment: RLE deficits/detail;LLE deficits/detail RLE Deficits / Details: AROM grossly WFL, strength hip flexion 2/5, knee extension 4-/5, ankle DF 4/5 LLE Deficits / Details: AROM grossly WFL, strength hip flexion 2/5, knee extension 4-/5, ankle DF 4/5    Cervical / Trunk Assessment Cervical / Trunk Assessment: Other exceptions Cervical / Trunk Exceptions: general stiffness/soreness from falling  Communication   Communication: No difficulties  Cognition Arousal/Alertness: Awake/alert Behavior During Therapy: WFL for tasks assessed/performed Overall Cognitive Status: Within Functional Limits for tasks assessed                                          General Comments General comments (skin integrity, edema, etc.): pt with foley    Exercises     Assessment/Plan    PT Assessment Patient needs continued PT services  PT Problem List Decreased strength;Decreased mobility;Decreased activity tolerance;Decreased balance;Decreased knowledge of use of DME;Pain;Decreased safety awareness       PT Treatment Interventions DME instruction;Therapeutic activities;Gait training;Therapeutic exercise;Patient/family education;Balance training;Functional mobility training    PT Goals (Current goals can be found in the Care Plan section)  Acute Rehab PT Goals Patient Stated Goal: hopeful for home if he can get more help PT Goal Formulation: With patient Time For Goal Achievement: 03/03/21 Potential to Achieve Goals: Good    Frequency Min 3X/week   Barriers to discharge Decreased caregiver support       Co-evaluation               AM-PAC PT "6 Clicks" Mobility  Outcome Measure Help needed turning from your back to your side while in a flat bed without using bedrails?: A Lot Help needed moving from lying on your back to sitting on the side of a flat bed without using bedrails?: A Lot Help needed moving to and from a bed to a chair (including a wheelchair)?: A Lot Help needed standing up from a chair using your arms (e.g., wheelchair or bedside chair)?: A Lot Help needed to walk in hospital room?: Total Help needed climbing 3-5 steps with a railing? : Total 6 Click Score: 10    End of Session Equipment Utilized During Treatment: Gait belt Activity Tolerance: Patient limited by fatigue Patient left: in bed;with call bell/phone within reach   PT Visit Diagnosis: Other abnormalities of gait and mobility (R26.89);Muscle weakness (generalized) (M62.81);History of falling (Z91.81)    Time: 1600-1630 PT Time Calculation (min) (ACUTE ONLY): 30 min   Charges:   PT Evaluation $PT Eval Moderate Complexity: 1 Mod PT Treatments $Therapeutic Activity: 8-22 mins        Magda Kiel, PT Acute Rehabilitation Services CLEXN:[phone] [phone] 02/17/2021  Reginia Naas 02/17/2021, 5:34 PM

## 2021-02-17 NOTE — ED Triage Notes (Signed)
EMS stated, he has numerous falls over the last week. C/o weakness and dizziness, he cant hardly hold his body up. Pt is borderline hypotension.  18 g left forearm 200cc N/S bolus.

## 2021-02-17 NOTE — Assessment & Plan Note (Addendum)
Baseline appears to be around 2.0. At hospital discharge in 09/2020, creatinine was 1.92.  Bladder scan showed 200cc urine, foley placed and no urine in bag. -check renal US to r/o obstruction  Is mildly hyperkalemic-given lokelma, novolog, dextrose. Place on telemetry and monitor  Receiving 2L bolus IVF and will continue maintenance fluids Hx of BPH with refractory urine retention per chart. Check renal US Avoid nephrotoxic drugs, would not continue metformin Follow bmp  F/u on UA and strict I/O.

## 2021-02-17 NOTE — Progress Notes (Signed)
Pharmacy Antibiotic Note  Tyler Lester is a 75 y.o. male admitted on 02/17/2021 presenting with weakness, hypotension, concern for sepsis.  Pharmacy has been consulted for vancomycin and cefepime dosing.  Hx CKD, SCr inc 3.08>3.2, BL appears closer to just under 2  Plan: Vancomycin 1250 mg IV x 1, then variable dosing due to unstable renal function Cefepime 2g IV q 24h Monitor renal function, Cx and clinical progression to narrow Vancomycin level as needed     Temp (24hrs), Avg:100.4 F (38 C), Min:100.4 F (38 C), Max:100.4 F (38 C)  Recent Labs  Lab 02/13/21 1928 02/14/21 0148 02/17/21 0930 02/17/21 1102  WBC 6.7  --  26.4*  --   CREATININE 2.40* 2.19* 3.08* 3.20*  LATICACIDVEN  --   --  2.3*  --     Estimated Creatinine Clearance: 17.2 mL/min (A) (by C-G formula based on SCr of 3.2 mg/dL (H)).    Allergies  Allergen Reactions   Poison Ivy Treatments Hives    Pt denies allergy to this medication    Bertis Ruddy, PharmD Clinical Pharmacist ED Pharmacist Phone # [phone] 02/17/2021 11:22 AM

## 2021-02-17 NOTE — Assessment & Plan Note (Signed)
Has history of chronic back pain and hip pain in chart xrays with no acute findings here Pain control with oxycodone/tylenol If no improvement, may need further imaging

## 2021-02-17 NOTE — Assessment & Plan Note (Signed)
-  PD likely playing a large role in this -sepsis/UTI exacerbating  -check CK since on ground x 1 hour -PT to eval/tx as he lives at home alone

## 2021-02-17 NOTE — Progress Notes (Signed)
Pt arrived to room 5M07 at [facility] from the ED.  Pt was covered in stool.  Pt was cleaned and fresh linens provided.  VS as below.  Temp 101.5.  Pt is A+O x 3. Charge RN [place] notified of yellow MEWS as pt has been in the ED.  Will monitor VS q2x2 then q4 at least.    02/17/21 1931  Vitals  Temp (!) 101.5 F (38.6 C)  Temp Source Oral  BP 134/79  MAP (mmHg) 96  BP Location Right Arm  BP Method Automatic  Patient Position (if appropriate) Lying  Pulse Rate (!) 107  Pulse Rate Source Monitor  Resp 20  MEWS COLOR  MEWS Score Color Yellow  Oxygen Therapy  SpO2 99 %  O2 Device Room Air   Ayesha Mohair BSN RN [facility]

## 2021-02-17 NOTE — Progress Notes (Signed)
[facility] following for sepsis protocol. Communicated w/ RN r/t f/u lactic acid.

## 2021-02-17 NOTE — Assessment & Plan Note (Signed)
Volume down Last echo: 02/2020: EF of 55-60% with normal LVF and indeterminate diastolic parameters.  Intake/output Monitor volume status with fluid resuscitation

## 2021-02-17 NOTE — Assessment & Plan Note (Addendum)
History of recurrent falls and weakness in setting of sepsis PT to eval and tx and f/u on recommendations as he lives alone  Continue sinemet and symmetrel

## 2021-02-17 NOTE — H&P (Addendum)
History and Physical    KAMORI Lester BZJ:[id number] DOB: [birth year] DOA: 02/17/2021  PCP: [facility]:  neurology Dr. Buck Mam at [facility] and urology unsure of provider  Patient coming from:  Home - lives alone   Chief Complaint: 2-3 day weakness and fall.   HPI: Tyler Lester is a 75 y.o. male with medical history significant of PD, HTN, CKD stage III, O1BP, chronic diastolic CHF, BPH with urinary retention who presented to ED after mechanical fall this AM. He states he has had worsening weakness, mainly in his legs, worsening tremor over th past few days. Yesterday evening he did his exercises on the bike and was really tired so went to his bed. Around 7am he woke up and went to kitchen and opened fridge to get ensure. He states his leg just got weak and he fell onto his butt. He was on the ground for an hour before help came after pressing his button. He wasn't able to get up and is unsure if he can stand up on his legs as he hasn't tried. He has pain in his lower back and right hip area. Rates the pain as an 8/10. Has full movement and sensation in his legs. He has had more than one fall and complaints of weakness as seen in ED with similar complaints on 02/13/21. See below.   He denies any fever/chills, headaches, URI symptoms, chest pain or palpitations, shortness of breath or cough, stomach pain, N/V/D, leg swelling or sores on body. Denies any dysuria, urgency/frequency or trouble going to the bathroom. Denies any CVA tenderness.   Recently treated for UTI at [facility] for UTI. Came in on 12/19 for fall and weakness with knee and hip pain. He was treated for UTI a month before this and still had burning with urination. Treated with keflex, given IVF and xray imaging wnl. Urine culture not obtained.   ED Course: vitals: temp: 100.4, bp: 135/11, HR: 106, RR: 18, oxygen: 97% RA Pertinent labs: WBC: 26.4, lactic acid: 2.3> pending, potassium: 6.1, BUN: 37, creatinine:  3.08, BC pending, UA not obtained yet, covid/flu negative,  CXR: no active disease, hip xray: no fracture or acute finding, lumbar spine: no acute finding In ED: sepsis code activated. Bolused 2L and started on broad spectrum abx. Lokelma, novolog given for hyperkalemia. TR[facility] was asked to admit.   Review of Systems: As per HPI; otherwise review of systems reviewed and negative.   Ambulatory Status:  Ambulates with walker    Past Medical History:  Diagnosis Date   Bilateral foot pain    Chronic pain    Diabetes mellitus    type 2, diet controlled now   Foley catheter in place since nov 2018, last changed 1 week ago   Hyperlipidemia    Hypertension    Inguinal hernia    right   Numbness    T/O   Parkinson disease ([place]) 08/26/2015   Followed by [facility]. Last visit 06/03/2015. History of poor compliance with his medication. Referred him back to [place] in 10/2016. They didn't want to see him back due to poor compliance with medical advise. Last plan from 06/03/2015 still valid   Parkinson's disease ([place])    Pneumonia yrs ago   Shortness of breath    with exertion   Urinary retention     Past Surgical History:  Procedure Laterality Date   CATARACT EXTRACTION W/ INTRAOCULAR LENS IMPLANT Left 06/2012   laser for posterior capsule?  COLON SURGERY     POSTERIOR CERVICAL FUSION/FORAMINOTOMY N/A 11/12/2013   Procedure: Posterior cervical decompression fusion, cervical 3-4, cervical 4-5, cervical 5-6, cervical 6-7 with instrumentation and allograft   (LEVEL 4);  Surgeon: Sinclair Ship, MD;  Location: [place];  Service: Orthopedics;  Laterality: N/A;  Posterior cervical decompression fusion, cervical 3-4, cervical 4-5, cervical 5-6, cervical 6-7 with instrumentation and allograft   TONSILLECTOMY     XI ROBOTIC ASSISTED SIMPLE PROSTATECTOMY N/A 03/07/2016   Procedure: XI ROBOTIC ASSISTED SIMPLE PROSTATECTOMY AND UMBILICAL HERNIA REPAIR flexible cystoscopy;  Surgeon: Alexis Frock, MD;  Location: [facility];  Service: Urology;  Laterality: N/A;    Social History   Socioeconomic History   Marital status: Single    Spouse name: Not on file   Number of children: 2   Years of education: 12   Highest education level: Not on file  Occupational History   Occupation: Retired     Comment: Event organiser at [employer]  Tobacco Use   Smoking status: Never   Smokeless tobacco: Never  Vaping Use   Vaping Use: Never used  Substance and Sexual Activity   Alcohol use: Yes    Alcohol/week: 0.0 standard drinks    Comment: 1 shot a day   Drug use: No   Sexual activity: Not on file  Other Topics Concern   Not on file  Social History Narrative   Patient lives at home alone.  He has a caregiver from 9 AM- 5 PM Monday through Sunday.   Patient have 2 children.    Patient has a high school education    Patient is right handed.    Patient is retired on disability.    Social Determinants of Health   Financial Resource Strain: Not on file  Food Insecurity: Not on file  Transportation Needs: Not on file  Physical Activity: Not on file  Stress: Not on file  Social Connections: Not on file  Intimate Partner Violence: Not on file    Allergies  Allergen Reactions   Poison Ivy Treatments Hives    Pt denies allergy to this medication    Family History  Problem Relation Age of Onset   Cancer - Other Other    Diabetes Other    Hypertension Other     Prior to Admission medications   Medication Sig Start Date End Date Taking? Authorizing Provider  allopurinol (ZYLOPRIM) 300 MG tablet Take 300 mg by mouth daily. 02/21/20   [provider]  amantadine (SYMMETREL) 100 MG capsule Take 100 mg by mouth 2 (two) times daily. 09/29/20   [provider]  amLODipine (NORVASC) 5 MG tablet Take 1 tablet (5 mg total) by mouth daily. 03/04/20 10/13/20  Kayleen Memos, DO  benzonatate (TESSALON) 100 MG capsule Take 1 capsule (100 mg total) by mouth every 8 (eight) hours. 10/27/20   Raspet, Erin K,  PA-C  carbidopa-levodopa (SINEMET IR) 25-250 MG tablet Take 1 tablet by mouth 4 (four) times daily.    [provider]  cephALEXin (KEFLEX) 500 MG capsule Take 1 capsule (500 mg total) by mouth 2 (two) times daily. 02/14/21   Montine Circle, PA-C  [facility] MELATONIN 5 MG TABS Take 5 mg by mouth at bedtime as needed (sleep). 09/02/20   [provider]  cyclobenzaprine (FLEXERIL) 5 MG tablet Take 5 mg by mouth 3 (three) times daily as needed for muscle spasms. 02/05/20   [provider]  diclofenac Sodium (VOLTAREN) 1 % GEL Apply 2-4  g topically daily as needed. Legs, hips 10/04/20   [provider]  donepezil (ARICEPT) 10 MG tablet Take 1 tablet by mouth every day Patient taking differently: Take 10 mg by mouth daily. 12/15/19   Wilber Oliphant, MD  gabapentin (NEURONTIN) 600 MG tablet Take 600 mg by mouth at bedtime as needed (pain). 10/04/20   [provider]  metFORMIN (GLUCOPHAGE) 500 MG tablet Take 500 mg by mouth daily. 09/05/20   [provider]  naloxone [facility]) nasal spray 4 mg/0.1 mL Place 4 mg into the nose as needed. 10/04/20   [provider]  omeprazole (PRILOSEC) 40 MG capsule Take 40 mg by mouth 2 (two) times daily. 09/10/20   [provider]  ondansetron (ZOFRAN ODT) 4 MG disintegrating tablet Take 1 tablet (4 mg total) by mouth every 8 (eight) hours as needed for nausea or vomiting. 04/18/20   Sharion Balloon, NP  oxyCODONE-acetaminophen (PERCOCET) 7.5-325 MG tablet Take 1 tablet by mouth 4 (four) times daily as needed for moderate pain. 09/10/19   [provider]  Vitamin D, Ergocalciferol, (DRISDOL) 1.25 MG (50000 UNIT) CAPS capsule Take 50,000 Units by mouth every Tuesday. 02/04/20   [provider]    Physical Exam: Vitals:   02/17/21 1100 02/17/21 1130 02/17/21 1200 02/17/21 1245  BP: 128/70 125/69 134/78 115/85  Pulse:    100  Resp:    (!) 21  Temp:      TempSrc:      SpO2:    98%     General:   Appears calm and comfortable and is in NAD. Diaphoretic  Eyes:  PERRL, EOMI, normal lids, iris ENT:  grossly normal hearing, lips & tongue, mmm; poor and missing dentition  Neck:  no LAD, masses or thyromegaly; no carotid bruits Cardiovascular:  RRR, no m/r/g. Trace bilateral pedal edema.  Respiratory:   CTA bilaterally with no wheezes/rales/rhonchi.  Normal respiratory effort. Abdomen:  soft, NT, ND, NABS Back:   normal alignment, no CVAT bilaterally. TTP over SI joints bilaterally.  Skin:  no rash or induration seen on limited exam Musculoskeletal:  no resting tremor on exam. BLE strength 4/5, good ROM, no bony abnormality Lower extremity:  Limited foot exam with no ulcerations.  2+ distal pulses. Psychiatric:  grossly normal mood, flat affect, speech fluent and appropriate, AOx3 Neurologic:  CN 2-12 grossly intact, moves all extremities in coordinated fashion, sensation intact    Radiological Exams on Admission: Independently reviewed - see discussion in A/P where applicable  DG Chest 1 View  Result Date: 02/17/2021 CLINICAL DATA:  Weakness.  Fall today. EXAM: CHEST  1 VIEW COMPARISON:  10/27/2020 FINDINGS: The heart size and mediastinal contours are within normal limits. Both lungs are clear. The visualized skeletal structures are unremarkable. Chronic fracture of the lowest right cervical fusion screw and probable loosening of the lowest left cervical fusion screw incidentally noted. IMPRESSION: No active disease. Electronically Signed   By: Nelson Chimes M.D.   On: 02/17/2021 10:13   DG Lumbar Spine Complete  Result Date: 02/17/2021 CLINICAL DATA:  Fall, low back pain EXAM: LUMBAR SPINE - COMPLETE 4+ VIEW COMPARISON:  02/13/2021 FINDINGS: Lumbar vertebral body heights and alignment are maintained. No fracture or traumatic listhesis. Intervertebral disc heights are relatively well preserved. Mild degenerative endplate changes. Aortic atherosclerosis. IMPRESSION: No acute fracture or  traumatic listhesis of the lumbar spine. Electronically Signed   By: Davina Poke D.O.   On: 02/17/2021 10:10   DG Hip Unilat  W or Wo Pelvis 2-3 Views Right  Result Date: 02/17/2021 CLINICAL DATA:  Fall with low back pain and right hip pain EXAM: DG HIP (WITH OR WITHOUT PELVIS) 2-3V RIGHT COMPARISON:  02/13/2021 FINDINGS: No change. No evidence of acute regional fracture. Chronic sacroiliac osteoarthritis as seen previously. IMPRESSION: No acute or traumatic finding. Chronic sacroiliac osteoarthritis. Electronically Signed   By: Nelson Chimes M.D.   On: 02/17/2021 10:11    EKG: Independently reviewed.  Sinus tachycardia with rate 106; nonspecific ST changes with no evidence of acute ischemia   Labs on Admission: I have personally reviewed the available labs and imaging studies at the time of the admission.  Pertinent labs:  WBC: 26.4,  lactic acid: 2.3> pending,  potassium: 6.1,  BUN: 37,  creatinine: 3.08,  BC pending,  UA not obtained yet,  covid/flu negative,   Assessment/Plan  Sepsis ([place]) 75 year old male presenting with fever, WBC to 26.4, tachycardia and weakness with a fall likely septic from UTI from history, but still waiting on collecting a urine. CXR clear, no confusion, no diarrhea or stomach pain and no open wounds.  -observation to telemetry with mild hyperkalemia -continue broad spectrum abx until urine results and can tailor this down  -cultures pending -received 2L IVF and will continue maintenance IVF at 125cc/hour  -trend cbc -tylenol for fever  Acute renal failure superimposed on stage 3 chronic kidney disease (HCC) Baseline appears to be around 2.0. At hospital discharge in 09/2020, creatinine was 1.92.  Bladder scan showed 200cc urine, foley placed and no urine in bag.  -check renal US to r/o obstruction  -sepsis could be contributing as well  Is mildly hyperkalemic-given lokelma, novolog, dextrose. Place on telemetry and monitor  Receiving 2L bolus IVF  and will continue maintenance fluids Hx of BPH with refractory urine retention per chart. Check renal US Avoid nephrotoxic drugs, would not continue metformin Follow bmp  F/u on UA and strict I/O.  Consult nephrology if no improvement   Parkinson's disease ([place]) History of recurrent falls and weakness in setting of sepsis PT to eval and tx and f/u on recommendations as he lives alone  Continue sinemet and symmetrel-Mar not completed and per neurology notes on dose that is not in [facility], will wait till we can verify drugs before ordering   Frequent falls -PD likely playing a large role in this -sepsis/UTI exacerbating  -check CK since on ground x 1 hour -PT to eval/tx as he lives at home alone   Type 2 diabetes mellitus with complication ([employer]) Recent A1c of 6.1 in August 2022 Hold metformin in setting of acute on CKD and would px alternative diabetic medication with CKD SSI and accuchecks per protocol   Essential hypertension Hold home BP medication (norvasc) with hypotension with sepsis  Chronic diastolic heart failure ([place]) Volume down Last echo: 02/2020: EF of 55-60% with normal LVF and indeterminate diastolic parameters.  Intake/output Monitor volume status with fluid resuscitation   BPH with Refracotry Urinary Retention Checking renal US Enlarged prostate and likely cause of blood in urine Keep foley in place until urine clears and may need urology consult   back pain and hip pain Has history of chronic back pain and hip pain in chart xrays with no acute findings here Pain control with oxycodone/tylenol (on chronic oxy) If no improvement, may need further imaging    Med Rec not done as he doesn't know medication and felicia didn't have list when he was talking to her.  She will try to call and give medication. He is on different dosing of his PD drugs per recent OV note that is not in med rec.   There is no height or weight on file to calculate BMI.   Level of care:  Telemetry Medical DVT prophylaxis:  Lovenox  Code Status: DNR - confirmed with patient Family Communication: None present; I attempted to call  the patient's friend, felicia ford, by telephone at the time of admission. Unable to leave VM Disposition Plan:  The patient is from: home  Anticipated d/c is to: per day team  Patient placed in observation as anticipate less than 2 midnight stay. Requires hospitalization to treat UTI and evaluate for weakness and falls as he is not safe to return home at this time.    Patient is currently: stable  Consults called: none   Admission status:  observation    Orma Flaming MD [facility]   How to contact the [facility] Attending or Consulting provider [place] or covering provider during after hours [place], for this patient?  Check the care team in [facility] and look for a) attending/consulting [facility] provider listed and b) the [facility] team listed Log into [email or web address] and use [place]'s universal password to access. If you do not have the password, please contact the hospital operator. Locate the [facility] provider you are looking for under [facility] and page to a number that you can be directly reached. If you still have difficulty reaching the provider, please page the [facility] (Director on Call) for the Hospitalists listed on amion for assistance.   02/17/2021, 2:46 PM

## 2021-02-17 NOTE — Assessment & Plan Note (Signed)
75 year old male presenting with fever, WBC to 26.4, tachycardia and weakness with a fall likely septic from UTI from history, but still waiting on collecting a urine. CXR clear, no confusion, no diarrhea or stomach pain and no open wounds.  -observation to telemetry with mild hyperkalemia -continue broad spectrum abx until urine results and can tailor this down  -cultures pending -received 2L IVF and will continue maintenance IVF at 125cc/hour  -trend cbc -tylenol for fever

## 2021-02-17 NOTE — Assessment & Plan Note (Addendum)
Checking renal US Enlarged prostate and likely cause of blood in urine Keep foley in place until urine clears and may need urology consult

## 2021-02-17 NOTE — ED Provider Notes (Signed)
Emergency Medicine Provider Triage Evaluation Note  Tyler Lester , a 75 y.o. male  was evaluated in triage.  Pt complains of weakness that has been persistent since his recent visit.  Patient has history of CKD, Parkinson's.  Patient in route with EMS was hypotensive with BP of 90/60 received a bolus of 250 mL.  Patient denies recent fever chills, dysuria, abdominal pain, chest pain, or shortness of breath.  Patient was recently seen in this emergency room on 12/19 for acute cystitis.  Patient reports when he woke up this morning he went over to his exercise bike.  He reports he was unable to exercise because of weakness.  Patient reports he walked over to the refrigerator and could not keep himself off and fell backwards.  Denies head trauma.  Reports he fell onto his hip and back.  Reports right hip pain and back pain.  Review of Systems  Positive: Weakness, fall Negative: Dysuria, fever, chills, abdominal pain, shortness of breath  Physical Exam  BP (!) 135/111 (BP Location: Left Arm)    Pulse (!) 106    Temp (!) 100.4 F (38 C) (Oral)    Resp 18    SpO2 97%  Gen:   Awake, no distress   Resp:  Normal effort  MSK:   Moves extremities without difficulty  Other:    Medical Decision Making  Medically screening exam initiated at 9:09 AM.  Appropriate orders placed.  Glenetta Borg was informed that the remainder of the evaluation will be completed by another provider, this initial triage assessment does not replace that evaluation, and the importance of remaining in the ED until their evaluation is complete.  Patient is febrile 100.4, tachycardic and had soft BP in route with EMS.  We will collect sepsis labs.   Evlyn Courier, PA-C 02/17/21 9741    Carmin Muskrat, MD 02/17/21 [phone]

## 2021-02-18 DIAGNOSIS — G8929 Other chronic pain: Secondary | ICD-10-CM | POA: Diagnosis present

## 2021-02-18 DIAGNOSIS — A0472 Enterocolitis due to Clostridium difficile, not specified as recurrent: Secondary | ICD-10-CM | POA: Diagnosis present

## 2021-02-18 DIAGNOSIS — R64 Cachexia: Secondary | ICD-10-CM | POA: Diagnosis present

## 2021-02-18 DIAGNOSIS — I130 Hypertensive heart and chronic kidney disease with heart failure and stage 1 through stage 4 chronic kidney disease, or unspecified chronic kidney disease: Secondary | ICD-10-CM | POA: Diagnosis present

## 2021-02-18 DIAGNOSIS — R296 Repeated falls: Secondary | ICD-10-CM | POA: Diagnosis not present

## 2021-02-18 DIAGNOSIS — E785 Hyperlipidemia, unspecified: Secondary | ICD-10-CM | POA: Diagnosis present

## 2021-02-18 DIAGNOSIS — R338 Other retention of urine: Secondary | ICD-10-CM | POA: Diagnosis present

## 2021-02-18 DIAGNOSIS — E119 Type 2 diabetes mellitus without complications: Secondary | ICD-10-CM | POA: Diagnosis not present

## 2021-02-18 DIAGNOSIS — G20 Parkinson's disease: Secondary | ICD-10-CM | POA: Diagnosis present

## 2021-02-18 DIAGNOSIS — H9192 Unspecified hearing loss, left ear: Secondary | ICD-10-CM | POA: Diagnosis present

## 2021-02-18 DIAGNOSIS — E1142 Type 2 diabetes mellitus with diabetic polyneuropathy: Secondary | ICD-10-CM | POA: Diagnosis present

## 2021-02-18 DIAGNOSIS — N401 Enlarged prostate with lower urinary tract symptoms: Secondary | ICD-10-CM | POA: Diagnosis present

## 2021-02-18 DIAGNOSIS — F02818 Dementia in other diseases classified elsewhere, unspecified severity, with other behavioral disturbance: Secondary | ICD-10-CM | POA: Diagnosis present

## 2021-02-18 DIAGNOSIS — R627 Adult failure to thrive: Secondary | ICD-10-CM | POA: Diagnosis present

## 2021-02-18 DIAGNOSIS — G4700 Insomnia, unspecified: Secondary | ICD-10-CM | POA: Diagnosis present

## 2021-02-18 DIAGNOSIS — E1122 Type 2 diabetes mellitus with diabetic chronic kidney disease: Secondary | ICD-10-CM | POA: Diagnosis present

## 2021-02-18 DIAGNOSIS — I5032 Chronic diastolic (congestive) heart failure: Secondary | ICD-10-CM | POA: Diagnosis present

## 2021-02-18 DIAGNOSIS — E860 Dehydration: Secondary | ICD-10-CM | POA: Diagnosis present

## 2021-02-18 DIAGNOSIS — E875 Hyperkalemia: Secondary | ICD-10-CM | POA: Diagnosis present

## 2021-02-18 DIAGNOSIS — Z981 Arthrodesis status: Secondary | ICD-10-CM | POA: Diagnosis not present

## 2021-02-18 DIAGNOSIS — N179 Acute kidney failure, unspecified: Secondary | ICD-10-CM | POA: Diagnosis present

## 2021-02-18 DIAGNOSIS — Z66 Do not resuscitate: Secondary | ICD-10-CM | POA: Diagnosis present

## 2021-02-18 DIAGNOSIS — N1832 Chronic kidney disease, stage 3b: Secondary | ICD-10-CM | POA: Diagnosis present

## 2021-02-18 DIAGNOSIS — Z20822 Contact with and (suspected) exposure to covid-19: Secondary | ICD-10-CM | POA: Diagnosis present

## 2021-02-18 DIAGNOSIS — A419 Sepsis, unspecified organism: Secondary | ICD-10-CM | POA: Diagnosis present

## 2021-02-18 DIAGNOSIS — N390 Urinary tract infection, site not specified: Secondary | ICD-10-CM | POA: Diagnosis present

## 2021-02-18 LAB — CBC
HCT: 44.6 % (ref 39.0–52.0)
Hemoglobin: 14.3 g/dL (ref 13.0–17.0)
MCH: 28.7 pg (ref 26.0–34.0)
MCHC: 32.1 g/dL (ref 30.0–36.0)
MCV: 89.6 fL (ref 80.0–100.0)
Platelets: 193 10*3/uL (ref 150–400)
RBC: 4.98 MIL/uL (ref 4.22–5.81)
RDW: 14.3 % (ref 11.5–15.5)
WBC: 19.3 10*3/uL — ABNORMAL HIGH (ref 4.0–10.5)
nRBC: 0 % (ref 0.0–0.2)

## 2021-02-18 LAB — GASTROINTESTINAL PANEL BY PCR, STOOL (REPLACES STOOL CULTURE)

## 2021-02-18 LAB — GLUCOSE, CAPILLARY
Glucose-Capillary: 70 mg/dL (ref 70–99)
Glucose-Capillary: 90 mg/dL (ref 70–99)
Glucose-Capillary: 106 mg/dL — ABNORMAL HIGH (ref 70–99)
Glucose-Capillary: 96 mg/dL (ref 70–99)
Glucose-Capillary: 91 mg/dL (ref 70–99)

## 2021-02-18 LAB — BASIC METABOLIC PANEL
Anion gap: 10 (ref 5–15)
BUN: 31 mg/dL — ABNORMAL HIGH (ref 8–23)
CO2: 20 mmol/L — ABNORMAL LOW (ref 22–32)
Calcium: 8.7 mg/dL — ABNORMAL LOW (ref 8.9–10.3)
Chloride: 106 mmol/L (ref 98–111)
Creatinine, Ser: 2.3 mg/dL — ABNORMAL HIGH (ref 0.61–1.24)
GFR, Estimated: 29 mL/min — ABNORMAL LOW (ref >60)
Glucose, Bld: 68 mg/dL — ABNORMAL LOW (ref 70–99)
Potassium: 4.1 mmol/L (ref 3.5–5.1)
Sodium: 136 mmol/L (ref 135–145)

## 2021-02-18 LAB — C DIFFICILE QUICK SCREEN W PCR REFLEX
C Diff antigen: POSITIVE — AB
C Diff toxin: NEGATIVE

## 2021-02-18 LAB — CLOSTRIDIUM DIFFICILE BY PCR, REFLEXED: Toxigenic C. Difficile by PCR: POSITIVE — AB

## 2021-02-18 LAB — URINE CULTURE: Culture: NO GROWTH

## 2021-02-18 LAB — CK: Total CK: 902 U/L — ABNORMAL HIGH (ref 49–397)

## 2021-02-18 MED ORDER — DONEPEZIL HCL 10 MG PO TABS
10.0000 mg | ORAL_TABLET | Freq: Every morning | ORAL | Status: DC
  Administered 2021-02-19 – 2021-02-21 (×3): 10 mg via ORAL
  Filled 2021-02-18 (×3): qty 1

## 2021-02-18 MED ORDER — MELATONIN 5 MG PO TABS
5.0000 mg | ORAL_TABLET | Freq: Every evening | ORAL | Status: DC | PRN
  Administered 2021-02-20 (×2): 5 mg via ORAL
  Filled 2021-02-18 (×2): qty 1

## 2021-02-18 MED ORDER — CARBIDOPA-LEVODOPA ER 50-200 MG PO TBCR
2.0000 | EXTENDED_RELEASE_TABLET | Freq: Two times a day (BID) | ORAL | Status: DC
  Administered 2021-02-18 – 2021-02-20 (×5): 2 via ORAL
  Filled 2021-02-18 (×7): qty 2

## 2021-02-18 MED ORDER — CARBIDOPA-LEVODOPA ER 50-200 MG PO TBCR
1.0000 | EXTENDED_RELEASE_TABLET | Freq: Two times a day (BID) | ORAL | Status: DC
  Administered 2021-02-19 – 2021-02-21 (×5): 1 via ORAL
  Filled 2021-02-18 (×6): qty 1

## 2021-02-18 MED ORDER — AMANTADINE HCL 100 MG PO CAPS
100.0000 mg | ORAL_CAPSULE | Freq: Two times a day (BID) | ORAL | Status: DC
  Administered 2021-02-18 – 2021-02-21 (×6): 100 mg via ORAL
  Filled 2021-02-18 (×7): qty 1

## 2021-02-18 MED ORDER — OXYCODONE-ACETAMINOPHEN 10-325 MG PO TABS: 1.0000 | ORAL_TABLET | Freq: Two times a day (BID) | ORAL | Status: DC | PRN

## 2021-02-18 MED ORDER — CARBIDOPA-LEVODOPA ER 50-200 MG PO TBCR: 1.0000 | EXTENDED_RELEASE_TABLET | ORAL | Status: DC

## 2021-02-18 MED ORDER — FIDAXOMICIN 200 MG PO TABS
200.0000 mg | ORAL_TABLET | Freq: Two times a day (BID) | ORAL | Status: DC
  Administered 2021-02-18 – 2021-02-21 (×6): 200 mg via ORAL
  Filled 2021-02-18 (×7): qty 1

## 2021-02-18 NOTE — Care Management Obs Status (Signed)
[facility] NOTIFICATION   Patient Details  Name: Tyler Lester MRN: [id number] Date of Birth: September 24, 1945   Medicare Observation Status Notification Given:  Yes    Bartholomew Crews, RN 02/18/2021, 3:24 PM

## 2021-02-18 NOTE — TOC Initial Note (Signed)
Transition of Care [facility]) - Initial/Assessment Note    Patient Details  Name: Tyler Lester MRN: [id number] Date of Birth: Aug 07, 1945  Transition of Care [facility]) CM/SW Contact:    Coralee Pesa, [place] Phone Number: 02/18/2021, 1:51 PM  Clinical Narrative:                 CSW spoke with pt at bedside to discuss SNF recommendations. Pt is agreeable and notes a preference for [facility]. Pt has been covid vaccinated and plans on returning home after rehab. Pt requested CSW follow up with his Nurse/ Friend Felicia. CSW spoke with [place] who is also agreeable and prefers [facility]. All questions answered. CSW will fax pt out for bed offers. TOC will continue to follow for DC needs.  Expected Discharge Plan: Skilled Nursing Facility Barriers to Discharge: Insurance Authorization, SNF Pending bed offer, SNF Pending transportation, Continued Medical Work up   Patient Goals and CMS Choice Patient states their goals for this hospitalization and ongoing recovery are:: Pt states his goal is to return home. CMS Medicare.gov Compare Post Acute Care list provided to:: Patient Choice offered to / list presented to : Patient  Expected Discharge Plan and Services Expected Discharge Plan: [place] Choice: [place] arrangements for the past 2 months: Single Family Home                                      Prior Living Arrangements/Services Living arrangements for the past 2 months: Single Family Home Lives with:: Other (Comment) (Nurse) Patient language and need for interpreter reviewed:: Yes Do you feel safe going back to the place where you live?: Yes      Need for Family Participation in Patient Care: Yes (Comment) Care giver support system in place?: Yes (comment)   Criminal Activity/Legal Involvement Pertinent to Current Situation/Hospitalization: No - Comment as needed  Activities of Daily Living Home Assistive  Devices/Equipment: Wheelchair ADL Screening (condition at time of admission) Patient's cognitive ability adequate to safely complete daily activities?: Yes Is the patient deaf or have difficulty hearing?: No Does the patient have difficulty seeing, even when wearing glasses/contacts?: No Does the patient have difficulty concentrating, remembering, or making decisions?: No Patient able to express need for assistance with ADLs?: Yes Does the patient have difficulty dressing or bathing?: Yes Independently performs ADLs?: No Communication: Independent Dressing (OT): Needs assistance Is this a change from baseline?: Pre-admission baseline Grooming: Needs assistance Is this a change from baseline?: Pre-admission baseline Feeding: Independent Bathing: Needs assistance Is this a change from baseline?: Pre-admission baseline Toileting: Needs assistance Is this a change from baseline?: Pre-admission baseline In/Out Bed: Needs assistance Is this a change from baseline?: Pre-admission baseline Walks in Home: Needs assistance Is this a change from baseline?: Pre-admission baseline Does the patient have difficulty walking or climbing stairs?: Yes Weakness of Legs: Both Weakness of Arms/Hands: None  Permission Sought/Granted Permission sought to share information with : Family Supports Permission granted to share information with : Yes, Verbal Permission Granted  Share Information with NAME: Fransico Him     Permission granted to share info w Relationship: Nurse/ Friend  Permission granted to share info w Contact Information: [phone]  Emotional Assessment Appearance:: Appears stated age Attitude/Demeanor/Rapport: Engaged Affect (typically observed): Appropriate Orientation: : Oriented to Self, Oriented to Place, Oriented to  Time, Oriented to  Situation Alcohol / Substance Use: Not Applicable Psych Involvement: No (comment)  Admission diagnosis:  Fall [W19.XXXA] AKI (acute kidney  injury) ([place]) [N17.9] Fall, initial encounter [W19.XXXA] Sepsis ([place]) [A41.9] Sepsis, due to unspecified organism, unspecified whether acute organ dysfunction present [facility]) [A41.9] Patient Active Problem List   Diagnosis Date Noted   Sepsis ([place]) 02/17/2021   back pain and hip pain 02/17/2021   Frequent falls 02/17/2021   Acute focal neurological deficit 03/01/2020   Ischial pain, right 08/25/2019   Palpitations 11/24/2018   Onychomycosis 08/05/2017   Chronic bilateral low back pain with bilateral sciatica 01/30/2017   Memory change 11/09/2016   Diabetic polyneuropathy associated with type 2 diabetes mellitus ([place]) 04/13/2016   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   Diminished hearing, left 01/20/2016   Type 2 diabetes mellitus with complication ([place]) 02/63/7858   Gait abnormality 03/28/2015   Postlaminectomy syndrome, cervical region 07/23/2014   Parkinson's disease ([place]) 07/23/2014   Back pain with sciatica 05/27/2014   Bilateral leg pain 05/14/2014   Depression 10/15/2013   CKD (chronic kidney disease) stage 3, GFR 30-59 ml/min (HCC) 08/25/2013   Tremor 05/23/2013   Acute renal failure superimposed on stage 3 chronic kidney disease ([place]) 05/08/2013   Chronic diastolic heart failure ([place]) 05/06/2013   Onychogryposis of toenail 05/14/2012   Essential hypertension 01/05/2011   PCP:  [facility] [place]:   [id number] [place], [place] [address] Phone: [phone] Fax: [phone]  [facility] [phone] - [place], [place] - [address] [place] [address] Phone: [phone] Fax: [phone]     Social Determinants of Health (SDOH) Interventions    Readmission Risk Interventions No flowsheet data found.

## 2021-02-18 NOTE — Progress Notes (Addendum)
PROGRESS NOTE    Tyler Lester  BOF:[id number] DOB: 1945-12-01 DOA: 02/17/2021 [facility]    Chief Complaint  Patient presents with   Fall   Weakness    Brief Narrative:  Tyler Lester is a 75 y.o. male with medical history significant of PD, HTN, CKD stage III, D7OE, chronic diastolic CHF, BPH with urinary retention who presented to ED after mechanical fall   Subjective:  He is calm, denies pain, continue to have diarrhea He is slightly confused about the time, able to self correct,  He reports " l was weak and fell at home" Reports has a walker at home and uses wheelchair occationally   Assessment & Plan:   Principal Problem:   Sepsis ([place]) Active Problems:   Essential hypertension   Chronic diastolic heart failure (HCC)   Acute renal failure superimposed on stage 3 chronic kidney disease ([place])   Parkinson's disease ([place])   Type 2 diabetes mellitus with complication ([place])   BPH with Refracotry Urinary Retention   back pain and hip pain   Frequent falls   Cdiff colitis/sepsis on admission - presenting with fever, WBC to 26.4, tachycardia  -Chest x-ray clear, blood culture no growth, urine culture no growth -d/c vanc/cefepime, start dificid  AKI? On CKD3b vs CKDIV Cr appear has been fluctuating  Continue monitor  Renal dosing meds Renal US no obstructive nephropathy Urine culture no growth, d/c abx  BPH with enlarged prostate on renal US Foley in place, urology follow up  Non-insulin-dependent type 2 diabetes Appear diet controlled, most recently A1c 6.1 On SSI here  Parkinson's/dementia Continue home medication  FTT, falls Imaging on admission no acute findings Will order PT/OT  : Body mass index is 24.25 kg/m..  .    Unresulted Labs (From admission, onward)     Start     Ordered   02/19/21 4235  Basic metabolic panel  Tomorrow morning,   R        02/18/21 1117   02/19/21 0500  CBC with Differential/Platelet  Tomorrow  morning,   R        02/18/21 1930   02/19/21 0500  Magnesium  Tomorrow morning,   R        02/18/21 1930              DVT prophylaxis: enoxaparin (LOVENOX) injection 30 mg Start: 02/17/21 1800   Code Status:DNR Family Communication: patient Disposition:   Dispo: The patient is from: home              Anticipated d/c is to: SNF              Anticipated d/c date is: early next week                Consultants:  urology  Procedures:  Foley placement   Antimicrobials:   Anti-infectives (From admission, onward)    Start     Dose/Rate Route Frequency Ordered Stop   02/18/21 2200  fidaxomicin (DIFICID) tablet 200 mg        200 mg Oral 2 times daily 02/18/21 1923 02/28/21 2159   02/18/21 1200  ceFEPIme (MAXIPIME) 2 g in sodium chloride 0.9 % 100 mL IVPB  Status:  Discontinued        2 g 200 mL/hr over 30 Minutes Intravenous Every 24 hours 02/17/21 1124 02/18/21 1928   02/17/21 1130  metroNIDAZOLE (FLAGYL) IVPB 500 mg        500 mg  100 mL/hr over 60 Minutes Intravenous  Once 02/17/21 1120 02/17/21 1456   02/17/21 1130  ceFEPIme (MAXIPIME) 2 g in sodium chloride 0.9 % 100 mL IVPB        2 g 200 mL/hr over 30 Minutes Intravenous  Once 02/17/21 1122 02/17/21 1456   02/17/21 1130  vancomycin (VANCOREADY) IVPB 1250 mg/250 mL        1,250 mg 166.7 mL/hr over 90 Minutes Intravenous  Once 02/17/21 1123 02/17/21 1831   02/17/21 1123  vancomycin variable dose per unstable renal function (pharmacist dosing)  Status:  Discontinued         Does not apply See admin instructions 02/17/21 1123 02/18/21 1929           Objective: Vitals:   02/17/21 2338 02/18/21 0421 02/18/21 1100 02/18/21 1644  BP: (!) 142/88 (!) 144/89 (!) 152/92 (!) 149/91  Pulse: (!) 103 (!) 102 100 93  Resp: 17 17 17 17   Temp: 98.4 F (36.9 C) 98.3 F (36.8 C) (!) 97.4 F (36.3 C) 98.3 F (36.8 C)  TempSrc: Oral Oral Oral   SpO2: 97% 98% 97% 100%  Weight:  74.5 kg    Height:        Intake/Output  Summary (Last 24 hours) at 02/18/2021 1940 Last data filed at 02/18/2021 1900 Gross per 24 hour  Intake 2164.61 ml  Output 2875 ml  Net -710.39 ml   Filed Weights   02/17/21 1526 02/17/21 1931 02/18/21 0421  Weight: 59 kg 74.7 kg 74.5 kg    Examination:  General exam: weak , chronically ill appearing, some mild tremor, slightly confused but able to self correct, + foley, some sediment in urine Respiratory system: Clear to auscultation. Respiratory effort normal. Cardiovascular system:  RRR.  Gastrointestinal system: Abdomen is nondistended, soft and nontender.  Normal bowel sounds heard. Central nervous system: Alert and orientedx2. No focal neurological deficits. Extremities:  no edema Skin: No rashes, lesions or ulcers Psychiatry: slightly confused, able to self-correct    Data Reviewed: I have personally reviewed following labs and imaging studies  CBC: Recent Labs  Lab 02/13/21 1928 02/17/21 0930 02/17/21 1102 02/18/21 0240  WBC 6.7 26.4*  --  19.3*  NEUTROABS 4.1 24.5*  --   --   HGB 15.6 15.3 18.0* 14.3  HCT 50.7 50.2 53.0* 44.6  MCV 93.0 93.3  --  89.6  PLT 245 232  --  494    Basic Metabolic Panel: Recent Labs  Lab 02/13/21 1928 02/14/21 0148 02/14/21 0239 02/17/21 0930 02/17/21 1102 02/18/21 0240  NA 139 139  --  137 140 136  K 5.9* 6.1* 5.5* 6.1* 6.0* 4.1  CL 107 107  --  105 113* 106  CO2 25 26  --  23  --  20*  GLUCOSE 113* 80  --  152* 128* 68*  BUN 36* 35*  --  37* 45* 31*  CREATININE 2.40* 2.19*  --  3.08* 3.20* 2.30*  CALCIUM 9.3 8.6*  --  9.3  --  8.7*    GFR: Estimated Creatinine Clearance: 28.6 mL/min (A) (by C-G formula based on SCr of 2.3 mg/dL (H)).  Liver Function Tests: Recent Labs  Lab 02/13/21 1928 02/17/21 0930  AST 13* 21  ALT <5 7  ALKPHOS 58 55  BILITOT 0.4 0.7  PROT 7.6 7.7  ALBUMIN 3.5 3.3*    CBG: Recent Labs  Lab 02/17/21 1957 02/18/21 0422 02/18/21 0625 02/18/21 1152 02/18/21 1642  GLUCAP 115* 70  90  106* 96     Recent Results (from the past 240 hour(s))  Resp Panel by RT-PCR (Flu A&B, Covid) Nasopharyngeal Swab     Status: None   Collection Time: 02/14/21  1:48 AM   Specimen: Nasopharyngeal Swab; Nasopharyngeal(NP) swabs in vial transport medium  Result Value Ref Range Status   SARS Coronavirus 2 by RT PCR NEGATIVE NEGATIVE Final    Comment: (NOTE) SARS-CoV-2 target nucleic acids are NOT DETECTED.  The SARS-CoV-2 RNA is generally detectable in upper respiratory specimens during the acute phase of infection. The lowest concentration of SARS-CoV-2 viral copies this assay can detect is 138 copies/mL. A negative result does not preclude SARS-Cov-2 infection and should not be used as the sole basis for treatment or other patient management decisions. A negative result may occur with  improper specimen collection/handling, submission of specimen other than nasopharyngeal swab, presence of viral mutation(s) within the areas targeted by this assay, and inadequate number of viral copies(<138 copies/mL). A negative result must be combined with clinical observations, patient history, and epidemiological information. The expected result is Negative.  Fact Sheet for Patients:  EntrepreneurPulse.com.au  Fact Sheet for Healthcare Providers:  IncredibleEmployment.be  This test is no t yet approved or cleared by the [place] FDA and  has been authorized for detection and/or diagnosis of SARS-CoV-2 by FDA under an Emergency Use Authorization (EUA). This EUA will remain  in effect (meaning this test can be used) for the duration of the COVID-19 declaration under Section 564(b)(1) of the Act, 21 U.S.C.section 360bbb-3(b)(1), unless the authorization is terminated  or revoked sooner.       Influenza A by PCR NEGATIVE NEGATIVE Final   Influenza B by PCR NEGATIVE NEGATIVE Final    Comment: (NOTE) The Xpert Xpress SARS-CoV-2/FLU/RSV plus assay is  intended as an aid in the diagnosis of influenza from Nasopharyngeal swab specimens and should not be used as a sole basis for treatment. Nasal washings and aspirates are unacceptable for Xpert Xpress SARS-CoV-2/FLU/RSV testing.  Fact Sheet for Patients: EntrepreneurPulse.com.au  Fact Sheet for Healthcare Providers: IncredibleEmployment.be  This test is not yet approved or cleared by the [place] FDA and has been authorized for detection and/or diagnosis of SARS-CoV-2 by FDA under an Emergency Use Authorization (EUA). This EUA will remain in effect (meaning this test can be used) for the duration of the COVID-19 declaration under Section 564(b)(1) of the Act, 21 U.S.C. section 360bbb-3(b)(1), unless the authorization is terminated or revoked.  Performed at [facility], [place] [address], [place], [place] [address]   Blood Culture (routine x 2)     Status: None (Preliminary result)   Collection Time: 02/17/21  9:30 AM   Specimen: BLOOD RIGHT ARM  Result Value Ref Range Status   Specimen Description BLOOD RIGHT ARM  Final   Special Requests   Final    BOTTLES DRAWN AEROBIC AND ANAEROBIC Blood Culture results may not be optimal due to an inadequate volume of blood received in culture bottles   Culture   Final    NO GROWTH 1 DAY Performed at [facility], [place] [address] [place], [place] [address]    Report Status PENDING  Incomplete  Blood Culture (routine x 2)     Status: None (Preliminary result)   Collection Time: 02/17/21 10:25 AM   Specimen: BLOOD  Result Value Ref Range Status   Specimen Description BLOOD SITE NOT SPECIFIED  Final   Special Requests   Final  BOTTLES DRAWN AEROBIC AND ANAEROBIC Blood Culture adequate volume   Culture   Final    NO GROWTH < 24 HOURS Performed at [facility], [place] [address], [place], [place] [address]    Report Status PENDING  Incomplete  Resp Panel by RT-PCR (Flu  A&B, Covid) Nasopharyngeal Swab     Status: None   Collection Time: 02/17/21 10:55 AM   Specimen: Nasopharyngeal Swab; Nasopharyngeal(NP) swabs in vial transport medium  Result Value Ref Range Status   SARS Coronavirus 2 by RT PCR NEGATIVE NEGATIVE Final    Comment: (NOTE) SARS-CoV-2 target nucleic acids are NOT DETECTED.  The SARS-CoV-2 RNA is generally detectable in upper respiratory specimens during the acute phase of infection. The lowest concentration of SARS-CoV-2 viral copies this assay can detect is 138 copies/mL. A negative result does not preclude SARS-Cov-2 infection and should not be used as the sole basis for treatment or other patient management decisions. A negative result may occur with  improper specimen collection/handling, submission of specimen other than nasopharyngeal swab, presence of viral mutation(s) within the areas targeted by this assay, and inadequate number of viral copies(<138 copies/mL). A negative result must be combined with clinical observations, patient history, and epidemiological information. The expected result is Negative.  Fact Sheet for Patients:  EntrepreneurPulse.com.au  Fact Sheet for Healthcare Providers:  IncredibleEmployment.be  This test is no t yet approved or cleared by the [place] FDA and  has been authorized for detection and/or diagnosis of SARS-CoV-2 by FDA under an Emergency Use Authorization (EUA). This EUA will remain  in effect (meaning this test can be used) for the duration of the COVID-19 declaration under Section 564(b)(1) of the Act, 21 U.S.C.section 360bbb-3(b)(1), unless the authorization is terminated  or revoked sooner.       Influenza A by PCR NEGATIVE NEGATIVE Final   Influenza B by PCR NEGATIVE NEGATIVE Final    Comment: (NOTE) The Xpert Xpress SARS-CoV-2/FLU/RSV plus assay is intended as an aid in the diagnosis of influenza from Nasopharyngeal swab specimens  and should not be used as a sole basis for treatment. Nasal washings and aspirates are unacceptable for Xpert Xpress SARS-CoV-2/FLU/RSV testing.  Fact Sheet for Patients: EntrepreneurPulse.com.au  Fact Sheet for Healthcare Providers: IncredibleEmployment.be  This test is not yet approved or cleared by the [place] FDA and has been authorized for detection and/or diagnosis of SARS-CoV-2 by FDA under an Emergency Use Authorization (EUA). This EUA will remain in effect (meaning this test can be used) for the duration of the COVID-19 declaration under Section 564(b)(1) of the Act, 21 U.S.C. section 360bbb-3(b)(1), unless the authorization is terminated or revoked.  Performed at [facility], [place] [address], [place], [place] [address]   Urine Culture     Status: None   Collection Time: 02/17/21  2:55 PM   Specimen: In/Out Cath Urine  Result Value Ref Range Status   Specimen Description IN/OUT CATH URINE  Final   Special Requests NONE  Final   Culture   Final    NO GROWTH Performed at [facility], [place] [address] [place], [place] [address]    Report Status 02/18/2021 FINAL  Final  Gastrointestinal Panel by PCR , Stool     Status: None   Collection Time: 02/18/21  8:05 AM   Specimen: STOOL  Result Value Ref Range Status   Campylobacter species NOT DETECTED NOT DETECTED Final   Plesimonas shigelloides NOT DETECTED NOT DETECTED Final   Salmonella  species NOT DETECTED NOT DETECTED Final   Yersinia enterocolitica NOT DETECTED NOT DETECTED Final   Vibrio species NOT DETECTED NOT DETECTED Final   Vibrio cholerae NOT DETECTED NOT DETECTED Final   Enteroaggregative E coli (EAEC) NOT DETECTED NOT DETECTED Final   Enteropathogenic E coli (EPEC) NOT DETECTED NOT DETECTED Final   Enterotoxigenic E coli (ETEC) NOT DETECTED NOT DETECTED Final   Shiga like toxin producing E coli (STEC) NOT DETECTED NOT DETECTED Final    Shigella/Enteroinvasive E coli (EIEC) NOT DETECTED NOT DETECTED Final   Cryptosporidium NOT DETECTED NOT DETECTED Final   Cyclospora cayetanensis NOT DETECTED NOT DETECTED Final   Entamoeba histolytica NOT DETECTED NOT DETECTED Final   Giardia lamblia NOT DETECTED NOT DETECTED Final   Adenovirus F40/41 NOT DETECTED NOT DETECTED Final   Astrovirus NOT DETECTED NOT DETECTED Final   Norovirus GI/GII NOT DETECTED NOT DETECTED Final   Rotavirus A NOT DETECTED NOT DETECTED Final   Sapovirus (I, II, IV, and V) NOT DETECTED NOT DETECTED Final    Comment: Performed at [facility], [address] [place], [place] [address]  C Difficile Quick Screen w PCR reflex     Status: Abnormal   Collection Time: 02/18/21  8:05 AM   Specimen: STOOL  Result Value Ref Range Status   C Diff antigen POSITIVE (A) NEGATIVE Final   C Diff toxin NEGATIVE NEGATIVE Final   C Diff interpretation Results are indeterminate. See PCR results.  Final    Comment: Performed at [facility], [place] [address], [place], [place] [address]  C. Diff by PCR, Reflexed     Status: Abnormal   Collection Time: 02/18/21  8:05 AM  Result Value Ref Range Status   Toxigenic C. Difficile by PCR POSITIVE (A) NEGATIVE Final    Comment: Positive for toxigenic C. difficile with little to no toxin production. Only treat if clinical presentation suggests symptomatic illness. Performed at [facility], [place] [address], [place], [place] [address]          Radiology Studies: DG Chest 1 View  Result Date: 02/17/2021 CLINICAL DATA:  Weakness.  Fall today. EXAM: CHEST  1 VIEW COMPARISON:  10/27/2020 FINDINGS: The heart size and mediastinal contours are within normal limits. Both lungs are clear. The visualized skeletal structures are unremarkable. Chronic fracture of the lowest right cervical fusion screw and probable loosening of the lowest left cervical fusion screw incidentally noted. IMPRESSION: No active disease.  Electronically Signed   By: Nelson Chimes M.D.   On: 02/17/2021 10:13   DG Lumbar Spine Complete  Result Date: 02/17/2021 CLINICAL DATA:  Fall, low back pain EXAM: LUMBAR SPINE - COMPLETE 4+ VIEW COMPARISON:  02/13/2021 FINDINGS: Lumbar vertebral body heights and alignment are maintained. No fracture or traumatic listhesis. Intervertebral disc heights are relatively well preserved. Mild degenerative endplate changes. Aortic atherosclerosis. IMPRESSION: No acute fracture or traumatic listhesis of the lumbar spine. Electronically Signed   By: Davina Poke D.O.   On: 02/17/2021 10:10   US RENAL  Result Date: 02/17/2021 CLINICAL DATA:  Acute kidney injury EXAM: RENAL / URINARY TRACT ULTRASOUND COMPLETE COMPARISON:  10/14/2020 FINDINGS: Right Kidney: Renal measurements: 10.0 by 4.6 by 5.7 cm = volume: 139 mL. Abnormal accentuated echogenicity is shown on image 7 series 1. 1.6 by 1.6 by 1.7 cm right kidney upper pole cyst with enhanced through transmission. No solid mass or hydronephrosis visualized. Left Kidney: Renal measurements: 9.5 by 6.2 by 4.6 cm = volume: 141  mL. Suspected mildly accentuated echogenicity. No mass or hydronephrosis visualized. Bladder: Appears normal for degree of bladder distention. Other: Below the urinary bladder, soft tissue possibly reflecting prostate tissue regrowth observed measuring 6.0 by 4.7 by 6.4 cm (volume = 94 cm^3). IMPRESSION: 1. Mildly accentuated renal echogenicity compared to the liver bilaterally, raising suspicion for chronic medical renal disease. 2. The patient has had a prior simple prostatectomy on 03/08/2016, cannot exclude regrowth of prostate tissue given the soft tissue echogenicity structure corresponding to prostate below the urinary bladder. Electronically Signed   By: Van Clines M.D.   On: 02/17/2021 15:20   DG Hip Unilat W or Wo Pelvis 2-3 Views Right  Result Date: 02/17/2021 CLINICAL DATA:  Fall with low back pain and right hip pain  EXAM: DG HIP (WITH OR WITHOUT PELVIS) 2-3V RIGHT COMPARISON:  02/13/2021 FINDINGS: No change. No evidence of acute regional fracture. Chronic sacroiliac osteoarthritis as seen previously. IMPRESSION: No acute or traumatic finding. Chronic sacroiliac osteoarthritis. Electronically Signed   By: Nelson Chimes M.D.   On: 02/17/2021 10:11        Scheduled Meds:  amantadine  100 mg Oral BID   carbidopa-levodopa  1-2 tablet Oral See admin instructions   Chlorhexidine Gluconate Cloth  6 each Topical Daily   [START ON 02/19/2021] donepezil  10 mg Oral q morning   enoxaparin (LOVENOX) injection  30 mg Subcutaneous Q24H   fidaxomicin  200 mg Oral BID   insulin aspart  0-9 Units Subcutaneous TID WC   Continuous Infusions:     LOS: 0 days   Time spent: 51mins Greater than 50% of this time was spent in counseling, explanation of diagnosis, planning of further management, and coordination of care.   Voice Recognition Viviann Spare dictation system was used to create this note, attempts have been made to correct errors. Please contact the author with questions and/or clarifications.   Florencia Reasons, MD PhD FACP [facility]  Available via Epic secure chat 7am-7pm for nonurgent issues Please page for urgent issues To page the attending provider between 7A-7P or the covering provider during after hours 7P-7A, please log into the web site [email or web address] and access using universal [place] password for that web site. If you do not have the password, please call the hospital operator.    02/18/2021, 7:40 PM

## 2021-02-18 NOTE — NC FL2 (Signed)
[place] LEVEL OF CARE SCREENING TOOL     IDENTIFICATION  Patient Name: Tyler Lester Birthdate: [birth year] Sex: male Admission Date (Current Location): 02/17/2021  [facility] and [place] Number:  Herbalist and Address:  [facility] [place], [place] [address], [place], [place] [address]      Provider Number: [id number]  Attending Physician Name and Address:  Florencia Reasons, MD  Relative Name and Phone Number:  Fransico Him, [phone]    Current Level of Care: Hospital Recommended Level of Care: [place] Prior Approval Number:    Date Approved/Denied:   PASRR Number: [id number]  Discharge Plan: SNF    Current Diagnoses: Patient Active Problem List   Diagnosis Date Noted   Sepsis ([place]) 02/17/2021   back pain and hip pain 02/17/2021   Frequent falls 02/17/2021   Acute focal neurological deficit 03/01/2020   Ischial pain, right 08/25/2019   Palpitations 11/24/2018   Onychomycosis 08/05/2017   Chronic bilateral low back pain with bilateral sciatica 01/30/2017   Memory change 11/09/2016   Diabetic polyneuropathy associated with type 2 diabetes mellitus ([place]) 04/13/2016   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   Diminished hearing, left 01/20/2016   Type 2 diabetes mellitus with complication ([place]) 08/19/7626   Gait abnormality 03/28/2015   Postlaminectomy syndrome, cervical region 07/23/2014   Parkinson's disease ([place]) 07/23/2014   Back pain with sciatica 05/27/2014   Bilateral leg pain 05/14/2014   Depression 10/15/2013   CKD (chronic kidney disease) stage 3, GFR 30-59 ml/min (HCC) 08/25/2013   Tremor 05/23/2013   Acute renal failure superimposed on stage 3 chronic kidney disease (HCC) 05/08/2013   Chronic diastolic heart failure ([place]) 05/06/2013   Onychogryposis of toenail 05/14/2012   Essential hypertension 01/05/2011    Orientation RESPIRATION BLADDER Height & Weight     Self, Time, Situation,  Place  Normal Incontinent, External catheter Weight: 164 lb 3.9 oz (74.5 kg) Height:  5\' 9"  (175.3 cm)  BEHAVIORAL SYMPTOMS/MOOD NEUROLOGICAL BOWEL NUTRITION STATUS      Incontinent Diet (See DC summary)  AMBULATORY STATUS COMMUNICATION OF NEEDS Skin   Extensive Assist Verbally Normal                       Personal Care Assistance Level of Assistance  Bathing, Feeding, Dressing Bathing Assistance: Limited assistance Feeding assistance: Independent Dressing Assistance: Limited assistance     Functional Limitations Info  Sight, Hearing, Speech Sight Info: Adequate Hearing Info: Adequate Speech Info: Adequate    SPECIAL CARE FACTORS FREQUENCY  PT (By licensed PT), OT (By licensed OT)     PT Frequency: 5x week OT Frequency: 5x week            Contractures Contractures Info: Not present    Additional Factors Info  Code Status, Allergies, Insulin Sliding Scale Code Status Info: DNR Allergies Info: Poison Ivy Treatments   Insulin Sliding Scale Info: Insulin Aspart (Novolog) 0-9 U 3x daily w/ meals       Current Medications (02/18/2021):  This is the current hospital active medication list Current Facility-Administered Medications  Medication Dose Route Frequency Provider Last Rate Last Admin   acetaminophen (TYLENOL) tablet 650 mg  650 mg Oral Q6H PRN Orma Flaming, MD   650 mg at 02/17/21 2042   Or   acetaminophen (TYLENOL) suppository 650 mg  650 mg Rectal Q6H PRN Orma Flaming, MD       ceFEPIme (MAXIPIME) 2 g  in sodium chloride 0.9 % 100 mL IVPB  2 g Intravenous Q24H Bertis Ruddy, RPH 200 mL/hr at 02/18/21 1227 2 g at 02/18/21 1227   Chlorhexidine Gluconate Cloth 2 % PADS 6 each  6 each Topical Daily Orma Flaming, MD   6 each at 02/18/21 0841   enoxaparin (LOVENOX) injection 30 mg  30 mg Subcutaneous Q24H Orma Flaming, MD   30 mg at 02/17/21 1900   insulin aspart (novoLOG) injection 0-9 Units  0-9 Units Subcutaneous TID WC Orma Flaming, MD        oxyCODONE (Oxy IR/ROXICODONE) immediate release tablet 10 mg  10 mg Oral Q6H PRN Orma Flaming, MD       vancomycin variable dose per unstable renal function (pharmacist dosing)   Does not apply See admin instructions Bertis Ruddy, [facility]         Discharge Medications: Please see discharge summary for a list of discharge medications.  Relevant Imaging Results:  Relevant Lab Results:   Additional Information SS# [id number], [id number]

## 2021-02-19 DIAGNOSIS — R627 Adult failure to thrive: Secondary | ICD-10-CM | POA: Diagnosis not present

## 2021-02-19 DIAGNOSIS — R296 Repeated falls: Secondary | ICD-10-CM | POA: Diagnosis not present

## 2021-02-19 DIAGNOSIS — N1832 Chronic kidney disease, stage 3b: Secondary | ICD-10-CM | POA: Diagnosis not present

## 2021-02-19 DIAGNOSIS — N179 Acute kidney failure, unspecified: Secondary | ICD-10-CM | POA: Diagnosis not present

## 2021-02-19 LAB — BASIC METABOLIC PANEL
Anion gap: 7 (ref 5–15)
BUN: 23 mg/dL (ref 8–23)
CO2: 23 mmol/L (ref 22–32)
Calcium: 9 mg/dL (ref 8.9–10.3)
Chloride: 103 mmol/L (ref 98–111)
Creatinine, Ser: 1.79 mg/dL — ABNORMAL HIGH (ref 0.61–1.24)
GFR, Estimated: 40 mL/min — ABNORMAL LOW (ref >60)
Glucose, Bld: 87 mg/dL (ref 70–99)
Potassium: 3.7 mmol/L (ref 3.5–5.1)
Sodium: 133 mmol/L — ABNORMAL LOW (ref 135–145)

## 2021-02-19 LAB — GLUCOSE, CAPILLARY
Glucose-Capillary: 71 mg/dL (ref 70–99)
Glucose-Capillary: 79 mg/dL (ref 70–99)
Glucose-Capillary: 83 mg/dL (ref 70–99)
Glucose-Capillary: 125 mg/dL — ABNORMAL HIGH (ref 70–99)

## 2021-02-19 LAB — MAGNESIUM: Magnesium: 1.6 mg/dL — ABNORMAL LOW (ref 1.7–2.4)

## 2021-02-19 MED ORDER — HYDROXYZINE HCL 25 MG PO TABS
50.0000 mg | ORAL_TABLET | Freq: Every evening | ORAL | Status: DC | PRN
  Administered 2021-02-20 (×2): 50 mg via ORAL
  Filled 2021-02-19 (×3): qty 2

## 2021-02-19 MED ORDER — GABAPENTIN 600 MG PO TABS
300.0000 mg | ORAL_TABLET | Freq: Once | ORAL | Status: AC
  Administered 2021-02-19: 300 mg via ORAL
  Filled 2021-02-19: qty 1

## 2021-02-19 MED ORDER — MAGNESIUM SULFATE 2 GM/50ML IV SOLN
2.0000 g | Freq: Once | INTRAVENOUS | Status: AC
  Administered 2021-02-19: 2 g via INTRAVENOUS
  Filled 2021-02-19: qty 50

## 2021-02-19 MED ORDER — ALLOPURINOL 300 MG PO TABS
300.0000 mg | ORAL_TABLET | Freq: Every morning | ORAL | Status: DC
  Administered 2021-02-20 – 2021-02-21 (×2): 300 mg via ORAL
  Filled 2021-02-19 (×2): qty 1

## 2021-02-19 MED ORDER — FINASTERIDE 2.5 MG HALF TABLET
2.5000 mg | ORAL_TABLET | Freq: Every day | ORAL | Status: DC
  Administered 2021-02-20 – 2021-02-21 (×2): 2.5 mg via ORAL
  Filled 2021-02-19 (×2): qty 1

## 2021-02-19 MED ORDER — GABAPENTIN 600 MG PO TABS
600.0000 mg | ORAL_TABLET | Freq: Every morning | ORAL | Status: DC
  Administered 2021-02-20 – 2021-02-21 (×2): 600 mg via ORAL
  Filled 2021-02-19 (×2): qty 1

## 2021-02-19 MED ORDER — LACTATED RINGERS IV SOLN: INTRAVENOUS | Status: AC

## 2021-02-19 MED ORDER — TAMSULOSIN HCL 0.4 MG PO CAPS
0.4000 mg | ORAL_CAPSULE | Freq: Every day | ORAL | Status: DC
  Administered 2021-02-19 – 2021-02-20 (×2): 0.4 mg via ORAL
  Filled 2021-02-19 (×2): qty 1

## 2021-02-19 NOTE — Progress Notes (Signed)
PROGRESS NOTE    Tyler Lester  CBS:[id number] DOB: 02-22-1946 DOA: 02/17/2021 PCP: [facility]    Chief Complaint  Patient presents with   Fall   Weakness    Brief Narrative:  Tyler Lester is a 75 y.o. male with medical history significant of PD, HTN, CKD stage III, W4YK, chronic diastolic CHF, BPH with urinary retention who presented to ED after mechanical fall   Subjective:  He  appears is stronger, pleasant, aaox3, still has memory deficit, he denies ab pain, reports continue to have diarrhea , 10 times last 24hrs per documentation  Fever appears has resolved C/o insomnia  Urine is clear , 2.8 liter urine output last 24hrs per documentation   Assessment & Plan:   Principal Problem:   Sepsis ([place]) Active Problems:   Essential hypertension   Chronic diastolic heart failure (HCC)   Acute renal failure superimposed on stage 3 chronic kidney disease ([place])   Parkinson's disease ([place])   Type 2 diabetes mellitus with complication ([place])   BPH with Refracotry Urinary Retention   back pain and hip pain   Frequent falls   Cdiff colitis/sepsis on admission - presenting with fever, WBC to 26.4, tachycardia  -Chest x-ray clear, blood culture no growth, urine culture no growth -d/c vanc/cefepime, started on dificid  AKI On CKD3b  Cr 3.2 on presentation, cr 1.79 today Continue monitor  Renal dosing meds Renal US no obstructive nephropathy Urine culture no growth Aki is from dehydration, no urine after foley placement in the ED, improved, good urine output last 24hrs  Hyperkalemia , on presentation K Normalized   BPH with enlarged prostate on renal US Foley in place,start flomax, proscar , plan for voiding trial prior to discharge  recommend outpatient urology follow up  Non-insulin-dependent type 2 diabetes Appear diet controlled, most recently A1c 6.1 On SSI here  Parkinson's/dementia Continue home medication  Insomnia, restart home meds  melatonin and hydroxyzine   FTT, falls Imaging on admission no acute findings Seen by PT/OT, needs SNF placement   : Body mass index is 24.25 kg/m..  .    Unresulted Labs (From admission, onward)     Start     Ordered   02/20/21 0500  CBC  Tomorrow morning,   R       Question:  Specimen collection method  Answer:  Lab=Lab collect   02/19/21 1515   02/20/21 5993  Basic metabolic panel  Daily,   R     Question:  Specimen collection method  Answer:  Lab=Lab collect   02/19/21 1515   02/20/21 0500  Magnesium  Tomorrow morning,   STAT       Question:  Specimen collection method  Answer:  Lab=Lab collect   02/19/21 1515   02/20/21 0500  Uric acid  Tomorrow morning,   R       Question:  Specimen collection method  Answer:  Lab=Lab collect   02/19/21 1520   02/19/21 0500  CBC with Differential/Platelet  Tomorrow morning,   R        02/18/21 1930              DVT prophylaxis: enoxaparin (LOVENOX) injection 30 mg Start: 02/17/21 1800   Code Status:DNR Family Communication: patient Disposition:   Dispo: The patient is from: home              Anticipated d/c is to: SNF  Anticipated d/c date is: needs diarrhea to slow down, needs voiding trial, sometime next week                Consultants:  Admitting MD talked to urology over the phone, urology not formally consulted   Procedures:  Foley placement in the ED  Antimicrobials:   Anti-infectives (From admission, onward)    Start     Dose/Rate Route Frequency Ordered Stop   02/18/21 2200  fidaxomicin (DIFICID) tablet 200 mg        200 mg Oral 2 times daily 02/18/21 1923 02/28/21 2159   02/18/21 1200  ceFEPIme (MAXIPIME) 2 g in sodium chloride 0.9 % 100 mL IVPB  Status:  Discontinued        2 g 200 mL/hr over 30 Minutes Intravenous Every 24 hours 02/17/21 1124 02/18/21 1928   02/17/21 1130  metroNIDAZOLE (FLAGYL) IVPB 500 mg        500 mg 100 mL/hr over 60 Minutes Intravenous  Once 02/17/21 1120 02/17/21  1456   02/17/21 1130  ceFEPIme (MAXIPIME) 2 g in sodium chloride 0.9 % 100 mL IVPB        2 g 200 mL/hr over 30 Minutes Intravenous  Once 02/17/21 1122 02/17/21 1456   02/17/21 1130  vancomycin (VANCOREADY) IVPB 1250 mg/250 mL        1,250 mg 166.7 mL/hr over 90 Minutes Intravenous  Once 02/17/21 1123 02/17/21 1831   02/17/21 1123  vancomycin variable dose per unstable renal function (pharmacist dosing)  Status:  Discontinued         Does not apply See admin instructions 02/17/21 1123 02/18/21 1929           Objective: Vitals:   02/18/21 1644 02/18/21 1954 02/19/21 0533 02/19/21 0900  BP: (!) 149/91 (!) 148/83 (!) 152/112 140/84  Pulse: 93 98 93 92  Resp: 17 18 16 16   Temp: 98.3 F (36.8 C) 98.9 F (37.2 C) 98.2 F (36.8 C) (!) 97.5 F (36.4 C)  TempSrc:  Oral Oral Oral  SpO2: 100% 100%  96%  Weight:      Height:        Intake/Output Summary (Last 24 hours) at 02/19/2021 1534 Last data filed at 02/19/2021 0900 Gross per 24 hour  Intake 1077 ml  Output 2350 ml  Net -1273 ml   Filed Weights   02/17/21 1526 02/17/21 1931 02/18/21 0421  Weight: 59 kg 74.7 kg 74.5 kg    Examination:  General exam: appear slightly stronger, chronically ill appearing, some mild chronic tremor, aaox3, impaired memory, + foley Respiratory system: Clear to auscultation. Respiratory effort normal. Cardiovascular system:  RRR.  Gastrointestinal system: Abdomen is nondistended, soft and nontender.  Normal bowel sounds heard. Central nervous system: Alert and orientedx3. No focal neurological deficits. Extremities:  no edema Skin: No rashes, lesions or ulcers Psychiatry: calm and pleasant     Data Reviewed: I have personally reviewed following labs and imaging studies  CBC: Recent Labs  Lab 02/13/21 1928 02/17/21 0930 02/17/21 1102 02/18/21 0240  WBC 6.7 26.4*  --  19.3*  NEUTROABS 4.1 24.5*  --   --   HGB 15.6 15.3 18.0* 14.3  HCT 50.7 50.2 53.0* 44.6  MCV 93.0 93.3  --   89.6  PLT 245 232  --  259    Basic Metabolic Panel: Recent Labs  Lab 02/13/21 1928 02/14/21 0148 02/14/21 0239 02/17/21 0930 02/17/21 1102 02/18/21 0240 02/19/21 1241  NA 139 139  --  137 140 136 133*  K 5.9* 6.1* 5.5* 6.1* 6.0* 4.1 3.7  CL 107 107  --  105 113* 106 103  CO2 25 26  --  23  --  20* 23  GLUCOSE 113* 80  --  152* 128* 68* 87  BUN 36* 35*  --  37* 45* 31* 23  CREATININE 2.40* 2.19*  --  3.08* 3.20* 2.30* 1.79*  CALCIUM 9.3 8.6*  --  9.3  --  8.7* 9.0  MG  --   --   --   --   --   --  1.6*    GFR: Estimated Creatinine Clearance: 36.8 mL/min (A) (by C-G formula based on SCr of 1.79 mg/dL (H)).  Liver Function Tests: Recent Labs  Lab 02/13/21 1928 02/17/21 0930  AST 13* 21  ALT <5 7  ALKPHOS 58 55  BILITOT 0.4 0.7  PROT 7.6 7.7  ALBUMIN 3.5 3.3*    CBG: Recent Labs  Lab 02/18/21 1152 02/18/21 1642 02/18/21 2154 02/19/21 0639 02/19/21 1223  GLUCAP 106* 96 91 71 79     Recent Results (from the past 240 hour(s))  Resp Panel by RT-PCR (Flu A&B, Covid) Nasopharyngeal Swab     Status: None   Collection Time: 02/14/21  1:48 AM   Specimen: Nasopharyngeal Swab; Nasopharyngeal(NP) swabs in vial transport medium  Result Value Ref Range Status   SARS Coronavirus 2 by RT PCR NEGATIVE NEGATIVE Final    Comment: (NOTE) SARS-CoV-2 target nucleic acids are NOT DETECTED.  The SARS-CoV-2 RNA is generally detectable in upper respiratory specimens during the acute phase of infection. The lowest concentration of SARS-CoV-2 viral copies this assay can detect is 138 copies/mL. A negative result does not preclude SARS-Cov-2 infection and should not be used as the sole basis for treatment or other patient management decisions. A negative result may occur with  improper specimen collection/handling, submission of specimen other than nasopharyngeal swab, presence of viral mutation(s) within the areas targeted by this assay, and inadequate number of  viral copies(<138 copies/mL). A negative result must be combined with clinical observations, patient history, and epidemiological information. The expected result is Negative.  Fact Sheet for Patients:  EntrepreneurPulse.com.au  Fact Sheet for Healthcare Providers:  IncredibleEmployment.be  This test is no t yet approved or cleared by the [place] FDA and  has been authorized for detection and/or diagnosis of SARS-CoV-2 by FDA under an Emergency Use Authorization (EUA). This EUA will remain  in effect (meaning this test can be used) for the duration of the COVID-19 declaration under Section 564(b)(1) of the Act, 21 U.S.C.section 360bbb-3(b)(1), unless the authorization is terminated  or revoked sooner.       Influenza A by PCR NEGATIVE NEGATIVE Final   Influenza B by PCR NEGATIVE NEGATIVE Final    Comment: (NOTE) The Xpert Xpress SARS-CoV-2/FLU/RSV plus assay is intended as an aid in the diagnosis of influenza from Nasopharyngeal swab specimens and should not be used as a sole basis for treatment. Nasal washings and aspirates are unacceptable for Xpert Xpress SARS-CoV-2/FLU/RSV testing.  Fact Sheet for Patients: EntrepreneurPulse.com.au  Fact Sheet for Healthcare Providers: IncredibleEmployment.be  This test is not yet approved or cleared by the [place] FDA and has been authorized for detection and/or diagnosis of SARS-CoV-2 by FDA under an Emergency Use Authorization (EUA). This EUA will remain in effect (meaning this test can be used) for the duration of the COVID-19 declaration under Section 564(b)(1) of the Act, 21 U.S.C. section 360bbb-3(b)(1),  unless the authorization is terminated or revoked.  Performed at [facility], [place] [address] [name], [place] [address]   Blood Culture (routine x 2)     Status: None (Preliminary result)   Collection Time: 02/17/21  9:30  AM   Specimen: BLOOD RIGHT ARM  Result Value Ref Range Status   Specimen Description BLOOD RIGHT ARM  Final   Special Requests   Final    BOTTLES DRAWN AEROBIC AND ANAEROBIC Blood Culture results may not be optimal due to an inadequate volume of blood received in culture bottles   Culture   Final    NO GROWTH 2 DAYS Performed at [facility], [place] [address], [place], [place] [address]    Report Status PENDING  Incomplete  Blood Culture (routine x 2)     Status: None (Preliminary result)   Collection Time: 02/17/21 10:25 AM   Specimen: BLOOD  Result Value Ref Range Status   Specimen Description BLOOD SITE NOT SPECIFIED  Final   Special Requests   Final    BOTTLES DRAWN AEROBIC AND ANAEROBIC Blood Culture adequate volume   Culture   Final    NO GROWTH 2 DAYS Performed at [facility], [name] [place],  [address]    Report Status PENDING  Incomplete  Resp Panel by RT-PCR (Flu A&B, Covid) Nasopharyngeal Swab     Status: None   Collection Time: 02/17/21 10:55 AM   Specimen: Nasopharyngeal Swab; Nasopharyngeal(NP) swabs in vial transport medium  Result Value Ref Range Status   SARS Coronavirus 2 by RT PCR NEGATIVE NEGATIVE Final    Comment: (NOTE) SARS-CoV-2 target nucleic acids are NOT DETECTED.  The SARS-CoV-2 RNA is generally detectable in upper respiratory specimens during the acute phase of infection. The lowest concentration of SARS-CoV-2 viral copies this assay can detect is 138 copies/mL. A negative result does not preclude SARS-Cov-2 infection and should not be used as the sole basis for treatment or other patient management decisions. A negative result may occur with  improper specimen collection/handling, submission of specimen other than nasopharyngeal swab, presence of viral mutation(s) within the areas targeted by this assay, and inadequate number of viral copies(<138 copies/mL). A negative result must be combined with clinical observations,  patient history, and epidemiological information. The expected result is Negative.  Fact Sheet for Patients:  EntrepreneurPulse.com.au  Fact Sheet for Healthcare Providers:  IncredibleEmployment.be  This test is no t yet approved or cleared by the [place] FDA and  has been authorized for detection and/or diagnosis of SARS-CoV-2 by FDA under an Emergency Use Authorization (EUA). This EUA will remain  in effect (meaning this test can be used) for the duration of the COVID-19 declaration under Section 564(b)(1) of the Act, 21 U.S.C.section 360bbb-3(b)(1), unless the authorization is terminated  or revoked sooner.       Influenza A by PCR NEGATIVE NEGATIVE Final   Influenza B by PCR NEGATIVE NEGATIVE Final    Comment: (NOTE) The Xpert Xpress SARS-CoV-2/FLU/RSV plus assay is intended as an aid in the diagnosis of influenza from Nasopharyngeal swab specimens and should not be used as a sole basis for treatment. Nasal washings and aspirates are unacceptable for Xpert Xpress SARS-CoV-2/FLU/RSV testing.  Fact Sheet for Patients: EntrepreneurPulse.com.au  Fact Sheet for Healthcare Providers: IncredibleEmployment.be  This test is not yet approved or cleared by the [place] FDA and has been authorized for detection and/or diagnosis of SARS-CoV-2 by FDA under an Emergency Use Authorization (  EUA). This EUA will remain in effect (meaning this test can be used) for the duration of the COVID-19 declaration under Section 564(b)(1) of the Act, 21 U.S.C. section 360bbb-3(b)(1), unless the authorization is terminated or revoked.  Performed at [facility], [place] [address] [place], [place] [address]   Urine Culture     Status: None   Collection Time: 02/17/21  2:55 PM   Specimen: In/Out Cath Urine  Result Value Ref Range Status   Specimen Description IN/OUT CATH URINE  Final   Special Requests NONE   Final   Culture   Final    NO GROWTH Performed at [facility], [place] [address] [place], [place] [address]    Report Status 02/18/2021 FINAL  Final  Gastrointestinal Panel by PCR , Stool     Status: None   Collection Time: 02/18/21  8:05 AM   Specimen: STOOL  Result Value Ref Range Status   Campylobacter species NOT DETECTED NOT DETECTED Final   Plesimonas shigelloides NOT DETECTED NOT DETECTED Final   Salmonella species NOT DETECTED NOT DETECTED Final   Yersinia enterocolitica NOT DETECTED NOT DETECTED Final   Vibrio species NOT DETECTED NOT DETECTED Final   Vibrio cholerae NOT DETECTED NOT DETECTED Final   Enteroaggregative E coli (EAEC) NOT DETECTED NOT DETECTED Final   Enteropathogenic E coli (EPEC) NOT DETECTED NOT DETECTED Final   Enterotoxigenic E coli (ETEC) NOT DETECTED NOT DETECTED Final   Shiga like toxin producing E coli (STEC) NOT DETECTED NOT DETECTED Final   Shigella/Enteroinvasive E coli (EIEC) NOT DETECTED NOT DETECTED Final   Cryptosporidium NOT DETECTED NOT DETECTED Final   Cyclospora cayetanensis NOT DETECTED NOT DETECTED Final   Entamoeba histolytica NOT DETECTED NOT DETECTED Final   Giardia lamblia NOT DETECTED NOT DETECTED Final   Adenovirus F40/41 NOT DETECTED NOT DETECTED Final   Astrovirus NOT DETECTED NOT DETECTED Final   Norovirus GI/GII NOT DETECTED NOT DETECTED Final   Rotavirus A NOT DETECTED NOT DETECTED Final   Sapovirus (I, II, IV, and V) NOT DETECTED NOT DETECTED Final    Comment: Performed at [facility], [place] [place], [place] [address]  C Difficile Quick Screen w PCR reflex     Status: Abnormal   Collection Time: 02/18/21  8:05 AM   Specimen: STOOL  Result Value Ref Range Status   C Diff antigen POSITIVE (A) NEGATIVE Final   C Diff toxin NEGATIVE NEGATIVE Final   C Diff interpretation Results are indeterminate. See PCR results.  Final    Comment: Performed at [facility], [place] [address], [place], [place]  [address]  C. Diff by PCR, Reflexed     Status: Abnormal   Collection Time: 02/18/21  8:05 AM  Result Value Ref Range Status   Toxigenic C. Difficile by PCR POSITIVE (A) NEGATIVE Final    Comment: Positive for toxigenic C. difficile with little to no toxin production. Only treat if clinical presentation suggests symptomatic illness. Performed at [facility], [place] [address], [place], [place] [address]          Radiology Studies: No results found.      Scheduled Meds:  [START ON 02/20/2021] allopurinol  300 mg Oral q morning   amantadine  100 mg Oral BID   carbidopa-levodopa  1 tablet Oral BID   And   carbidopa-levodopa  2 tablet Oral BID   Chlorhexidine Gluconate Cloth  6 each Topical Daily   donepezil  10  mg Oral q morning   enoxaparin (LOVENOX) injection  30 mg Subcutaneous Q24H   fidaxomicin  200 mg Oral BID   gabapentin  300 mg Oral Once   [START ON 02/20/2021] gabapentin  600 mg Oral q morning   insulin aspart  0-9 Units Subcutaneous TID WC   Continuous Infusions:  lactated ringers     magnesium sulfate bolus IVPB        LOS: 1 day   Time spent: 52mins Greater than 50% of this time was spent in counseling, explanation of diagnosis, planning of further management, and coordination of care.   Voice Recognition Viviann Spare dictation system was used to create this note, attempts have been made to correct errors. Please contact the author with questions and/or clarifications.   Florencia Reasons, MD PhD FACP [facility]  Available via Epic secure chat 7am-7pm for nonurgent issues Please page for urgent issues To page the attending provider between 7A-7P or the covering provider during after hours 7P-7A, please log into the web site [email or web address] and access using universal [place] password for that web site. If you do not have the password, please call the hospital operator.    02/19/2021, 3:34 PM

## 2021-02-19 NOTE — Plan of Care (Signed)

## 2021-02-20 DIAGNOSIS — N179 Acute kidney failure, unspecified: Secondary | ICD-10-CM

## 2021-02-20 DIAGNOSIS — G20 Parkinson's disease: Secondary | ICD-10-CM | POA: Diagnosis not present

## 2021-02-20 DIAGNOSIS — A419 Sepsis, unspecified organism: Principal | ICD-10-CM

## 2021-02-20 DIAGNOSIS — N1832 Chronic kidney disease, stage 3b: Secondary | ICD-10-CM

## 2021-02-20 DIAGNOSIS — R296 Repeated falls: Secondary | ICD-10-CM | POA: Diagnosis not present

## 2021-02-20 DIAGNOSIS — E119 Type 2 diabetes mellitus without complications: Secondary | ICD-10-CM | POA: Diagnosis not present

## 2021-02-20 DIAGNOSIS — A0472 Enterocolitis due to Clostridium difficile, not specified as recurrent: Secondary | ICD-10-CM

## 2021-02-20 DIAGNOSIS — R627 Adult failure to thrive: Secondary | ICD-10-CM

## 2021-02-20 LAB — CBC
HCT: 45.5 % (ref 39.0–52.0)
Hemoglobin: 14.2 g/dL (ref 13.0–17.0)
MCH: 28.3 pg (ref 26.0–34.0)
MCHC: 31.2 g/dL (ref 30.0–36.0)
MCV: 90.8 fL (ref 80.0–100.0)
Platelets: 207 10*3/uL (ref 150–400)
RBC: 5.01 MIL/uL (ref 4.22–5.81)
RDW: 13.9 % (ref 11.5–15.5)
WBC: 6.3 10*3/uL (ref 4.0–10.5)
nRBC: 0 % (ref 0.0–0.2)

## 2021-02-20 LAB — URIC ACID: Uric Acid, Serum: 4.4 mg/dL (ref 3.7–8.6)

## 2021-02-20 LAB — GLUCOSE, CAPILLARY
Glucose-Capillary: 71 mg/dL (ref 70–99)
Glucose-Capillary: 90 mg/dL (ref 70–99)
Glucose-Capillary: 82 mg/dL (ref 70–99)
Glucose-Capillary: 113 mg/dL — ABNORMAL HIGH (ref 70–99)

## 2021-02-20 LAB — BASIC METABOLIC PANEL
Anion gap: 9 (ref 5–15)
BUN: 21 mg/dL (ref 8–23)
CO2: 26 mmol/L (ref 22–32)
Calcium: 9 mg/dL (ref 8.9–10.3)
Chloride: 101 mmol/L (ref 98–111)
Creatinine, Ser: 1.85 mg/dL — ABNORMAL HIGH (ref 0.61–1.24)
GFR, Estimated: 38 mL/min — ABNORMAL LOW (ref >60)
Glucose, Bld: 108 mg/dL — ABNORMAL HIGH (ref 70–99)
Potassium: 3.3 mmol/L — ABNORMAL LOW (ref 3.5–5.1)
Sodium: 136 mmol/L (ref 135–145)

## 2021-02-20 LAB — MAGNESIUM: Magnesium: 1.8 mg/dL (ref 1.7–2.4)

## 2021-02-20 MED ORDER — ENOXAPARIN SODIUM 40 MG/0.4ML IJ SOSY
40.0000 mg | PREFILLED_SYRINGE | INTRAMUSCULAR | Status: DC
  Administered 2021-02-20: 40 mg via SUBCUTANEOUS
  Filled 2021-02-20: qty 0.4

## 2021-02-20 NOTE — Progress Notes (Signed)
Patient requesting for lab to come back at later time. Provider on call notified.

## 2021-02-20 NOTE — Progress Notes (Signed)
Physical Therapy Treatment Patient Details Name: Tyler Lester MRN: [id number] DOB: [birth year] Today's Date: 02/20/2021   History of Present Illness Tyler Lester is a 75 y.o. male with medical history significant of PD, HTN, CKD stage III, I4PP, chronic diastolic CHF, BPH with urinary retention admitted after mechanical fall with recent weakness and worsening tremor after treated for UTI 12/19 in ED with fall and weakness.  Found to be septic.    PT Comments    Pt making good progress towards his physical therapy goals, requiring less assist overall for mobility this session and demonstrates improved activity tolerance. Pt ambulating 180 feet with a walker at a min guard assist level, utilizing step to pattern. Pt declining SNF. Will benefit from follow up HHPT to address strengthening, balance, and endurance.    Recommendations for follow up therapy are one component of a multi-disciplinary discharge planning process, led by the attending physician.  Recommendations may be updated based on patient status, additional functional criteria and insurance authorization.  Follow Up Recommendations  Home health PT     Assistance Recommended at Discharge Frequent or constant Supervision/Assistance  Equipment Recommendations  None recommended by PT    Recommendations for Other Services       Precautions / Restrictions Precautions Precautions: Fall Restrictions Weight Bearing Restrictions: No     Mobility  Bed Mobility Overal bed mobility: Modified Independent             General bed mobility comments: Increased time, use of bed rail    Transfers Overall transfer level: Needs assistance Equipment used: Rolling walker (2 wheels) Transfers: Sit to/from Stand Sit to Stand: Min guard                Ambulation/Gait Ambulation/Gait assistance: Min guard Gait Distance (Feet): 180 Feet Assistive device: Rolling walker (2 wheels) Gait Pattern/deviations: Decreased  stride length;Shuffle;Trunk flexed;Step-to pattern;Knee flexed in stance - right Gait velocity: decreased Gait velocity interpretation: <1.8 ft/sec, indicate of risk for recurrent falls   General Gait Details: Cues for monitoring speed/cadence, upright posture, and segmental turning. Pt utilizing step to pattern with decreased bilateral foot clearance, min guard due to mild bilateral knee instability   Stairs             Wheelchair Mobility    Modified Rankin (Stroke Patients Only)       Balance Overall balance assessment: Needs assistance Sitting-balance support: Feet supported Sitting balance-Leahy Scale: Fair     Standing balance support: Bilateral upper extremity supported Standing balance-Leahy Scale: Poor                              Cognition Arousal/Alertness: Awake/alert Behavior During Therapy: WFL for tasks assessed/performed Overall Cognitive Status: Within Functional Limits for tasks assessed                                          Exercises      General Comments        Pertinent Vitals/Pain Pain Assessment: No/denies pain    Home Living                          Prior Function            PT Goals (current goals can now be found in the care plan section)  Acute Rehab PT Goals Patient Stated Goal: home Potential to Achieve Goals: Good Progress towards PT goals: Progressing toward goals    Frequency    Min 3X/week      PT Plan Discharge plan needs to be updated    Co-evaluation              AM-PAC PT "6 Clicks" Mobility   Outcome Measure  Help needed turning from your back to your side while in a flat bed without using bedrails?: None Help needed moving from lying on your back to sitting on the side of a flat bed without using bedrails?: None Help needed moving to and from a bed to a chair (including a wheelchair)?: A Little Help needed standing up from a chair using your arms (e.g.,  wheelchair or bedside chair)?: A Little Help needed to walk in hospital room?: A Little Help needed climbing 3-5 steps with a railing? : A Lot 6 Click Score: 19    End of Session Equipment Utilized During Treatment: Gait belt Activity Tolerance: Patient tolerated treatment well Patient left: in chair;with call bell/phone within reach Nurse Communication: Mobility status PT Visit Diagnosis: Other abnormalities of gait and mobility (R26.89);Muscle weakness (generalized) (M62.81);History of falling (Z91.81)     Time: 7209-4709 PT Time Calculation (min) (ACUTE ONLY): 18 min  Charges:  $Gait Training: 8-22 mins                     Wyona Almas, PT, DPT Acute Rehabilitation Services Pager [phone] Office [phone]    Deno Etienne 02/20/2021, 12:47 PM

## 2021-02-20 NOTE — TOC Transition Note (Deleted)
Transition of Care [facility]) - CM/SW Discharge Note   Patient Details  Name: Tyler Lester MRN: [id number] Date of Birth: 1945/06/29  [facility] CM/SW Contact:  Tom-Johnson, Renea Ee, RN Phone Number: 02/20/2021, 11:18 AM   Clinical Narrative:    Case manager consulted for home health needs. SNF initially recommended by PT but patient declined requesting to go home with home health. MD made aware. CM spoke with patient at bedside and home health agency offers from Medicare.gov given to patient and he chose [place]. Referral made with Tommi Rumps [phone]) and acceptance voiced. Patient states brother lives with him. Has a care giver named Fransico Him and she is listed as his friend. Has necessary DME's at home. PCP is Rayford Halsted, MD and uses [facility] [address]. Felicia to transport at discharge. CM will continue to follow with needs.    Final next level of care: [place] Barriers to Discharge: Barriers Resolved   Patient Goals and CMS Choice Patient states their goals for this hospitalization and ongoing recovery are:: To go home CMS Medicare.gov Compare Post Acute Care list provided to:: Patient Choice offered to / list presented to : Patient  Discharge Placement                       Discharge Plan and Services     Post Acute Care Choice: [place]          DME Arranged: N/A DME Agency: NA       HH Arranged: PT, OT [place] Agency: [place] Date [place]: 02/20/21 Time [place]: 11 Representative spoke with at [place]: [place]) Interventions     Readmission Risk Interventions No flowsheet data found.

## 2021-02-20 NOTE — Progress Notes (Signed)
PROGRESS NOTE    Tyler Lester  ZSW:[id number] DOB: [birth year] DOA: 02/17/2021 PCP: [facility]  Chief Complaint  Patient presents with   Fall   Weakness    Brief Narrative:  Tyler Lester is a 75 y.o. male with medical history significant of PD, HTN, CKD stage III, D2KG, chronic diastolic CHF, BPH with urinary retention who presented to ED after mechanical fall    Assessment & Plan:   Principal Problem:   Sepsis ([place]) Active Problems:   Essential hypertension   Chronic diastolic heart failure (HCC)   Acute renal failure superimposed on stage 3 chronic kidney disease ([place])   Parkinson's disease ([place])   Type 2 diabetes mellitus with complication ([place])   BPH with Refracotry Urinary Retention   back pain and hip pain   Frequent falls  Cdiff colitis/sepsis on admission - presenting with fever, WBC to 26.4, tachycardia  -Chest x-ray clear, blood culture no growth, urine culture no growth -d/c vanc/cefepime, started on dificid   AKI On CKD3b  Cr 3.2 on presentation, cr 1.79 today Continue monitor  Renal dosing meds Renal US no obstructive nephropathy Urine culture no growth Aki is from dehydration, no urine after foley placement in the ED, improved, good urine output last 24hrs   Hyperkalemia , on presentation K Normalized    BPH with enlarged prostate on renal US Foley in place, started flomax, proscar , plan for voiding trial prior to discharge - nurse will remove Foley this evening and if he is able to void can f/u out patient or if unable to void can be sent w/ catheter in place hopefully can d/c tomorrow    Non-insulin-dependent type 2 diabetes Appear diet controlled, most recently A1c 6.1 On SSI here   Parkinson's/dementia Continue home medication   Insomnia, restart home meds melatonin and hydroxyzine    FTT, falls Imaging on admission no acute findings Seen by PT/OT, needs SNF placement but declined in favor of home w/ HH : Body mass  index is 24.25 kg/m..   .    DVT prophylaxis: lovenox Code Status: DNR Disposition Plan: home w/ HH   Consultants:  Admitting MD talked to urology over the phone, urology not formally consulted   Procedures: foley placement  Antimicrobials:  Anti-infectives (From admission, onward)    Start     Dose/Rate Route Frequency Ordered Stop   02/18/21 2200  fidaxomicin (DIFICID) tablet 200 mg        200 mg Oral 2 times daily 02/18/21 1923 02/28/21 2159   02/18/21 1200  ceFEPIme (MAXIPIME) 2 g in sodium chloride 0.9 % 100 mL IVPB  Status:  Discontinued        2 g 200 mL/hr over 30 Minutes Intravenous Every 24 hours 02/17/21 1124 02/18/21 1928   02/17/21 1130  metroNIDAZOLE (FLAGYL) IVPB 500 mg        500 mg 100 mL/hr over 60 Minutes Intravenous  Once 02/17/21 1120 02/17/21 1456   02/17/21 1130  ceFEPIme (MAXIPIME) 2 g in sodium chloride 0.9 % 100 mL IVPB        2 g 200 mL/hr over 30 Minutes Intravenous  Once 02/17/21 1122 02/17/21 1456   02/17/21 1130  vancomycin (VANCOREADY) IVPB 1250 mg/250 mL        1,250 mg 166.7 mL/hr over 90 Minutes Intravenous  Once 02/17/21 1123 02/17/21 1831   02/17/21 1123  vancomycin variable dose per unstable renal function (pharmacist dosing)  Status:  Discontinued  Does not apply See admin instructions 02/17/21 1123 02/18/21 1929         Subjective: Reports feeling okay today but tired, notes that he does not really want to go to SNF despite weakness and multiple falls, would like to pursue home health   Objective: Vitals:   02/19/21 2200 02/20/21 0619 02/20/21 0902 02/20/21 1653  BP: (!) 152/101 (!) 134/42 105/88 111/66  Pulse: 85 78 80 76  Resp: 14 19 17 18   Temp: 97.6 F (36.4 C) 97.8 F (36.6 C) (!) 97.5 F (36.4 C) (!) 97.5 F (36.4 C)  TempSrc: Oral Oral Oral Oral  SpO2: 100% 100% 100% 99%  Weight:      Height:        Intake/Output Summary (Last 24 hours) at 02/20/2021 1730 Last data filed at 02/20/2021 1508 Gross per  24 hour  Intake 2641.05 ml  Output 300 ml  Net 2341.05 ml   Filed Weights   02/17/21 1526 02/17/21 1931 02/18/21 0421  Weight: 59 kg 74.7 kg 74.5 kg    Examination:  General exam: Appears calm and comfortable  Respiratory system: Clear to auscultation. Respiratory effort normal. Cardiovascular system: S1 & S2 heard, RRR.  Gastrointestinal system: Abdomen is nondistended, soft and nontender.  Central nervous system: Alert and oriented. No focal neurological deficits Skin: No rashes, lesions or ulcers Psychiatry: Judgement and insight appear normal. Mood & affect appropriate.     Data Reviewed: I have personally reviewed following labs and imaging studies  CBC: Recent Labs  Lab 02/13/21 1928 02/17/21 0930 02/17/21 1102 02/18/21 0240 02/20/21 0845  WBC 6.7 26.4*  --  19.3* 6.3  NEUTROABS 4.1 24.5*  --   --   --   HGB 15.6 15.3 18.0* 14.3 14.2  HCT 50.7 50.2 53.0* 44.6 45.5  MCV 93.0 93.3  --  89.6 90.8  PLT 245 232  --  193 818   Basic Metabolic Panel: Recent Labs  Lab 02/14/21 0148 02/14/21 0239 02/17/21 0930 02/17/21 1102 02/18/21 0240 02/19/21 1241 02/20/21 0845  NA 139  --  137 140 136 133* 136  K 6.1*   < > 6.1* 6.0* 4.1 3.7 3.3*  CL 107  --  105 113* 106 103 101  CO2 26  --  23  --  20* 23 26  GLUCOSE 80  --  152* 128* 68* 87 108*  BUN 35*  --  37* 45* 31* 23 21  CREATININE 2.19*  --  3.08* 3.20* 2.30* 1.79* 1.85*  CALCIUM 8.6*  --  9.3  --  8.7* 9.0 9.0  MG  --   --   --   --   --  1.6* 1.8   < > = values in this interval not displayed.   GFR: Estimated Creatinine Clearance: 35.6 mL/min (A) (by C-G formula based on SCr of 1.85 mg/dL (H)). Liver Function Tests: Recent Labs  Lab 02/13/21 1928 02/17/21 0930  AST 13* 21  ALT <5 7  ALKPHOS 58 55  BILITOT 0.4 0.7  PROT 7.6 7.7  ALBUMIN 3.5 3.3*   No results for input(s): LIPASE, AMYLASE in the last 168 hours. No results for input(s): AMMONIA in the last 168 hours. Coagulation Profile: Recent  Labs  Lab 02/17/21 0930  INR 1.1   Cardiac Enzymes: Recent Labs  Lab 02/17/21 1538 02/18/21 0240  CKTOTAL 821* 902*   BNP (last 3 results) No results for input(s): PROBNP in the last 8760 hours. HbA1C: No results for input(s): HGBA1C  in the last 72 hours. CBG: Recent Labs  Lab 02/19/21 1703 02/19/21 2129 02/20/21 0653 02/20/21 1134 02/20/21 1641  GLUCAP 83 125* 71 90 82   Lipid Profile: No results for input(s): CHOL, HDL, LDLCALC, TRIG, CHOLHDL, LDLDIRECT in the last 72 hours. Thyroid Function Tests: No results for input(s): TSH, T4TOTAL, FREET4, T3FREE, THYROIDAB in the last 72 hours. Anemia Panel: No results for input(s): VITAMINB12, FOLATE, FERRITIN, TIBC, IRON, RETICCTPCT in the last 72 hours. Urine analysis:    Component Value Date/Time   COLORURINE AMBER (A) 02/17/2021 1455   APPEARANCEUR CLOUDY (A) 02/17/2021 1455   LABSPEC 1.020 02/17/2021 1455   PHURINE 5.5 02/17/2021 1455   GLUCOSEU 100 (A) 02/17/2021 1455   HGBUR LARGE (A) 02/17/2021 1455   BILIRUBINUR NEGATIVE 02/17/2021 1455   BILIRUBINUR NEGATIVE 05/27/2014 1020   KETONESUR NEGATIVE 02/17/2021 1455   PROTEINUR 30 (A) 02/17/2021 1455   UROBILINOGEN 1.0 10/29/2014 0042   NITRITE NEGATIVE 02/17/2021 1455   LEUKOCYTESUR TRACE (A) 02/17/2021 1455   Sepsis Labs: @LABRCNTIP (procalcitonin:4,lacticidven:4)  ) Recent Results (from the past 240 hour(s))  Resp Panel by RT-PCR (Flu A&B, Covid) Nasopharyngeal Swab     Status: None   Collection Time: 02/14/21  1:48 AM   Specimen: Nasopharyngeal Swab; Nasopharyngeal(NP) swabs in vial transport medium  Result Value Ref Range Status   SARS Coronavirus 2 by RT PCR NEGATIVE NEGATIVE Final    Comment: (NOTE) SARS-CoV-2 target nucleic acids are NOT DETECTED.  The SARS-CoV-2 RNA is generally detectable in upper respiratory specimens during the acute phase of infection. The lowest concentration of SARS-CoV-2 viral copies this assay can detect is 138 copies/mL. A  negative result does not preclude SARS-Cov-2 infection and should not be used as the sole basis for treatment or other patient management decisions. A negative result may occur with  improper specimen collection/handling, submission of specimen other than nasopharyngeal swab, presence of viral mutation(s) within the areas targeted by this assay, and inadequate number of viral copies(<138 copies/mL). A negative result must be combined with clinical observations, patient history, and epidemiological information. The expected result is Negative.  Fact Sheet for Patients:  EntrepreneurPulse.com.au  Fact Sheet for Healthcare Providers:  IncredibleEmployment.be  This test is no t yet approved or cleared by the [place] FDA and  has been authorized for detection and/or diagnosis of SARS-CoV-2 by FDA under an Emergency Use Authorization (EUA). This EUA will remain  in effect (meaning this test can be used) for the duration of the COVID-19 declaration under Section 564(b)(1) of the Act, 21 U.S.C.section 360bbb-3(b)(1), unless the authorization is terminated  or revoked sooner.       Influenza A by PCR NEGATIVE NEGATIVE Final   Influenza B by PCR NEGATIVE NEGATIVE Final    Comment: (NOTE) The Xpert Xpress SARS-CoV-2/FLU/RSV plus assay is intended as an aid in the diagnosis of influenza from Nasopharyngeal swab specimens and should not be used as a sole basis for treatment. Nasal washings and aspirates are unacceptable for Xpert Xpress SARS-CoV-2/FLU/RSV testing.  Fact Sheet for Patients: EntrepreneurPulse.com.au  Fact Sheet for Healthcare Providers: IncredibleEmployment.be  This test is not yet approved or cleared by the [place] FDA and has been authorized for detection and/or diagnosis of SARS-CoV-2 by FDA under an Emergency Use Authorization (EUA). This EUA will remain in effect (meaning this test can  be used) for the duration of the COVID-19 declaration under Section 564(b)(1) of the Act, 21 U.S.C. section 360bbb-3(b)(1), unless the authorization is terminated or revoked.  Performed  at [facility], [place] [address] [place], [place] [address]   Blood Culture (routine x 2)     Status: None (Preliminary result)   Collection Time: 02/17/21  9:30 AM   Specimen: BLOOD RIGHT ARM  Result Value Ref Range Status   Specimen Description BLOOD RIGHT ARM  Final   Special Requests   Final    BOTTLES DRAWN AEROBIC AND ANAEROBIC Blood Culture results may not be optimal due to an inadequate volume of blood received in culture bottles   Culture   Final    NO GROWTH 3 DAYS Performed at [facility], [place] [address] [place], [place] [address]    Report Status PENDING  Incomplete  Blood Culture (routine x 2)     Status: None (Preliminary result)   Collection Time: 02/17/21 10:25 AM   Specimen: BLOOD  Result Value Ref Range Status   Specimen Description BLOOD SITE NOT SPECIFIED  Final   Special Requests   Final    BOTTLES DRAWN AEROBIC AND ANAEROBIC Blood Culture adequate volume   Culture   Final    NO GROWTH 3 DAYS Performed at [facility], [address] [place], [place] [address]    Report Status PENDING  Incomplete  Resp Panel by RT-PCR (Flu A&B, Covid) Nasopharyngeal Swab     Status: None   Collection Time: 02/17/21 10:55 AM   Specimen: Nasopharyngeal Swab; Nasopharyngeal(NP) swabs in vial transport medium  Result Value Ref Range Status   SARS Coronavirus 2 by RT PCR NEGATIVE NEGATIVE Final    Comment: (NOTE) SARS-CoV-2 target nucleic acids are NOT DETECTED.  The SARS-CoV-2 RNA is generally detectable in upper respiratory specimens during the acute phase of infection. The lowest concentration of SARS-CoV-2 viral copies this assay can detect is 138 copies/mL. A negative result does not preclude SARS-Cov-2 infection and should not be used as the sole basis  for treatment or other patient management decisions. A negative result may occur with  improper specimen collection/handling, submission of specimen other than nasopharyngeal swab, presence of viral mutation(s) within the areas targeted by this assay, and inadequate number of viral copies(<138 copies/mL). A negative result must be combined with clinical observations, patient history, and epidemiological information. The expected result is Negative.  Fact Sheet for Patients:  EntrepreneurPulse.com.au  Fact Sheet for Healthcare Providers:  IncredibleEmployment.be  This test is no t yet approved or cleared by the [place] FDA and  has been authorized for detection and/or diagnosis of SARS-CoV-2 by FDA under an Emergency Use Authorization (EUA). This EUA will remain  in effect (meaning this test can be used) for the duration of the COVID-19 declaration under Section 564(b)(1) of the Act, 21 U.S.C.section 360bbb-3(b)(1), unless the authorization is terminated  or revoked sooner.       Influenza A by PCR NEGATIVE NEGATIVE Final   Influenza B by PCR NEGATIVE NEGATIVE Final    Comment: (NOTE) The Xpert Xpress SARS-CoV-2/FLU/RSV plus assay is intended as an aid in the diagnosis of influenza from Nasopharyngeal swab specimens and should not be used as a sole basis for treatment. Nasal washings and aspirates are unacceptable for Xpert Xpress SARS-CoV-2/FLU/RSV testing.  Fact Sheet for Patients: EntrepreneurPulse.com.au  Fact Sheet for Healthcare Providers: IncredibleEmployment.be  This test is not yet approved or cleared by the [place] FDA and has been authorized for detection and/or diagnosis of SARS-CoV-2 by FDA under an Emergency Use Authorization (EUA). This EUA will remain in effect (meaning this test  can be used) for the duration of the COVID-19 declaration under Section 564(b)(1) of the Act, 21  U.S.C. section 360bbb-3(b)(1), unless the authorization is terminated or revoked.  Performed at [facility], [place] [address], [place], [place] [address]   Urine Culture     Status: None   Collection Time: 02/17/21  2:55 PM   Specimen: In/Out Cath Urine  Result Value Ref Range Status   Specimen Description IN/OUT CATH URINE  Final   Special Requests NONE  Final   Culture   Final    NO GROWTH Performed at [facility], [place] [address] [place], [place] [address]    Report Status 02/18/2021 FINAL  Final  Gastrointestinal Panel by PCR , Stool     Status: None   Collection Time: 02/18/21  8:05 AM   Specimen: STOOL  Result Value Ref Range Status   Campylobacter species NOT DETECTED NOT DETECTED Final   Plesimonas shigelloides NOT DETECTED NOT DETECTED Final   Salmonella species NOT DETECTED NOT DETECTED Final   Yersinia enterocolitica NOT DETECTED NOT DETECTED Final   Vibrio species NOT DETECTED NOT DETECTED Final   Vibrio cholerae NOT DETECTED NOT DETECTED Final   Enteroaggregative E coli (EAEC) NOT DETECTED NOT DETECTED Final   Enteropathogenic E coli (EPEC) NOT DETECTED NOT DETECTED Final   Enterotoxigenic E coli (ETEC) NOT DETECTED NOT DETECTED Final   Shiga like toxin producing E coli (STEC) NOT DETECTED NOT DETECTED Final   Shigella/Enteroinvasive E coli (EIEC) NOT DETECTED NOT DETECTED Final   Cryptosporidium NOT DETECTED NOT DETECTED Final   Cyclospora cayetanensis NOT DETECTED NOT DETECTED Final   Entamoeba histolytica NOT DETECTED NOT DETECTED Final   Giardia lamblia NOT DETECTED NOT DETECTED Final   Adenovirus F40/41 NOT DETECTED NOT DETECTED Final   Astrovirus NOT DETECTED NOT DETECTED Final   Norovirus GI/GII NOT DETECTED NOT DETECTED Final   Rotavirus A NOT DETECTED NOT DETECTED Final   Sapovirus (I, II, IV, and V) NOT DETECTED NOT DETECTED Final    Comment: Performed at [facility], [address] [place], [place] [address]  C Difficile Quick  Screen w PCR reflex     Status: Abnormal   Collection Time: 02/18/21  8:05 AM   Specimen: STOOL  Result Value Ref Range Status   C Diff antigen POSITIVE (A) NEGATIVE Final   C Diff toxin NEGATIVE NEGATIVE Final   C Diff interpretation Results are indeterminate. See PCR results.  Final    Comment: Performed at [facility], [place] [address], [place], [place] [address]  C. Diff by PCR, Reflexed     Status: Abnormal   Collection Time: 02/18/21  8:05 AM  Result Value Ref Range Status   Toxigenic C. Difficile by PCR POSITIVE (A) NEGATIVE Final    Comment: Positive for toxigenic C. difficile with little to no toxin production. Only treat if clinical presentation suggests symptomatic illness. Performed at [facility], [place] [address], [place], [place] [address]          Radiology Studies: No results found.      Scheduled Meds:  allopurinol  300 mg Oral q morning   amantadine  100 mg Oral BID   carbidopa-levodopa  1 tablet Oral BID   And   carbidopa-levodopa  2 tablet Oral BID   Chlorhexidine Gluconate Cloth  6 each Topical Daily   donepezil  10 mg Oral q morning   enoxaparin (LOVENOX) injection  40 mg Subcutaneous  Q24H   fidaxomicin  200 mg Oral BID   finasteride  2.5 mg Oral Daily   gabapentin  600 mg Oral q morning   insulin aspart  0-9 Units Subcutaneous TID WC   tamsulosin  0.4 mg Oral QPC supper   Continuous Infusions:   LOS: 2 days    Time spent: 35 min     Emeterio Reeve, MD [facility] Pager [phone]  If 7PM-7AM, please contact night-coverage [email or web address] Password [facility] 02/20/2021, 5:30 PM

## 2021-02-20 NOTE — TOC Progression Note (Signed)
Transition of Care [facility]) - Progression Note    Patient Details  Name: Tyler Lester MRN: [id number] Date of Birth: 23-Sep-1945  [facility] CM/SW Contact  Tom-Johnson, Renea Ee, RN Phone Number: 02/20/2021, 12:51 PM  Clinical Narrative:     Case manager consulted for home health needs. SNF initially recommended by PT but patient declined requesting to go home with home health. MD made aware. CM spoke with patient at bedside and home health agency offers from Medicare.gov given to patient and he chose [place]. Referral made with Tommi Rumps [phone]) and acceptance voiced. Patient states brother lives with him. Has a care giver named Fransico Him and she is listed as his friend. Has necessary DME's at home. PCP is Rayford Halsted, MD and [facility] [address]. Felicia to transport at discharge. CM will continue to follow with needs.        Expected Discharge Plan: Skilled Nursing Facility Barriers to Discharge: Barriers Resolved  Expected Discharge Plan and Services Expected Discharge Plan: [place] Choice: [place] Living arrangements for the past 2 months: Single Family Home                 DME Arranged: N/A DME Agency: NA       HH Arranged: PT, OT HH Agency: [place] Date [place]: 02/20/21 Time HH Agency Contacted: 68 Representative spoke with at [place]: [place] ([place]) Interventions    Readmission Risk Interventions No flowsheet data found.

## 2021-02-21 ENCOUNTER — Other Ambulatory Visit (HOSPITAL_COMMUNITY): Payer: Self-pay

## 2021-02-21 DIAGNOSIS — E119 Type 2 diabetes mellitus without complications: Secondary | ICD-10-CM | POA: Diagnosis not present

## 2021-02-21 DIAGNOSIS — R296 Repeated falls: Secondary | ICD-10-CM | POA: Diagnosis not present

## 2021-02-21 DIAGNOSIS — A419 Sepsis, unspecified organism: Secondary | ICD-10-CM | POA: Diagnosis not present

## 2021-02-21 DIAGNOSIS — G20 Parkinson's disease: Secondary | ICD-10-CM | POA: Diagnosis not present

## 2021-02-21 LAB — BASIC METABOLIC PANEL
Anion gap: 6 (ref 5–15)
BUN: 20 mg/dL (ref 8–23)
CO2: 23 mmol/L (ref 22–32)
Calcium: 8.4 mg/dL — ABNORMAL LOW (ref 8.9–10.3)
Chloride: 107 mmol/L (ref 98–111)
Creatinine, Ser: 1.76 mg/dL — ABNORMAL HIGH (ref 0.61–1.24)
GFR, Estimated: 40 mL/min — ABNORMAL LOW (ref >60)
Glucose, Bld: 82 mg/dL (ref 70–99)
Potassium: 3.5 mmol/L (ref 3.5–5.1)
Sodium: 136 mmol/L (ref 135–145)

## 2021-02-21 LAB — GLUCOSE, CAPILLARY
Glucose-Capillary: 81 mg/dL (ref 70–99)
Glucose-Capillary: 102 mg/dL — ABNORMAL HIGH (ref 70–99)
Glucose-Capillary: 87 mg/dL (ref 70–99)

## 2021-02-21 MED ORDER — FINASTERIDE 5 MG PO TABS
2.5000 mg | ORAL_TABLET | Freq: Every day | ORAL | 0 refills | Status: DC
  Filled 2021-02-21: qty 15, 30d supply, fill #0

## 2021-02-21 MED ORDER — TAMSULOSIN HCL 0.4 MG PO CAPS
0.4000 mg | ORAL_CAPSULE | Freq: Every day | ORAL | 0 refills | Status: DC
  Filled 2021-02-21: qty 30, 30d supply, fill #0

## 2021-02-21 MED ORDER — FIDAXOMICIN 200 MG PO TABS
200.0000 mg | ORAL_TABLET | Freq: Two times a day (BID) | ORAL | 0 refills | Status: AC
  Filled 2021-02-21: qty 14, 7d supply, fill #0

## 2021-02-21 NOTE — TOC Benefit Eligibility Note (Signed)
Patient Teacher, English as a foreign language completed.    The patient is currently admitted and upon discharge could be taking Dificid 200 mg tablets.  The current 10 day co-pay is, $0.00.   The patient is insured through [place], [place] Patient Advocate Specialist [place] Patient Advocate Team Direct Number: [phone]  Fax: [phone]

## 2021-02-21 NOTE — TOC Transition Note (Signed)
Transition of Care [facility]) - CM/SW Discharge Note   Patient Details  Name: Tyler Lester MRN: [id number] Date of Birth: June 19, 1945  Transition of Care [facility]) CM/SW Contact:  Tom-Johnson, Renea Ee, RN Phone Number: 02/21/2021, 1:13 PM   Clinical Narrative:    Patient is scheduled for discharge today. Home health referral with Alvis Lemmings and information on [facility]. Denies any other needs. Brother to transport at discharge. No further TOC needs noted.   Final next level of care: [place] Barriers to Discharge: Barriers Resolved   Patient Goals and CMS Choice Patient states their goals for this hospitalization and ongoing recovery are:: To go home CMS Medicare.gov Compare Post Acute Care list provided to:: Patient Choice offered to / list presented to : Patient  Discharge Placement                       Discharge Plan and Services     Post Acute Care Choice: [place]          DME Arranged: N/A DME Agency: NA       HH Arranged: PT, OT [place] Agency: [place] Date [place]: 02/20/21 Time [place]: 17 Representative spoke with at [place]: [place] ([place]) Interventions     Readmission Risk Interventions No flowsheet data found.

## 2021-02-22 LAB — CULTURE, BLOOD (ROUTINE X 2)
Culture: NO GROWTH
Culture: NO GROWTH
Special Requests: ADEQUATE

## 2021-02-26 NOTE — Discharge Summary (Signed)
Physician Discharge Summary  FIELDS OROS SLH:[id number] DOB: 03-24-1945 DOA: 02/17/2021  PCP: [facility]  Admit date: 02/17/2021 Discharge date: 02/26/2021  Admitted From: home Disposition: home w/ [facility]  Recommendations for Outpatient Follow-up:  Follow up with PCP in 1-2 weeks Please obtain BMP/CBC in one week Please follow up on the following pending results:   Discharge Condition: stable  CODE STATUS: DNR  Diet recommendation: heart healthy   Brief/Interim Summary: Tyler Lester is a 76 y.o. male with medical history significant of PD, HTN, CKD stage III, L5BW, chronic diastolic CHF, BPH with urinary retention who presented to ED after mechanical fall d/t weakness in leg and also known parkinson's, found to be in sepsis d/t UTI, transitioned to po abx prior to discharge, AKI improved w/ fluids.   Discharge Diagnoses:  Principal Problem:   Sepsis ([place]) Active Problems:   Essential hypertension   Chronic diastolic heart failure (HCC)   Acute renal failure superimposed on stage 3 chronic kidney disease (HCC)   Parkinson's disease (HCC)   Type 2 diabetes mellitus with complication (HCC)   BPH with Refracotry Urinary Retention   back pain and hip pain   Frequent falls    Discharge Instructions  Discharge Instructions     Call MD for:  difficulty breathing, headache or visual disturbances   Complete by: As directed    Call MD for:  persistant dizziness or light-headedness   Complete by: As directed    Call MD for:  persistant nausea and vomiting   Complete by: As directed    Call MD for:  severe uncontrolled pain   Complete by: As directed    Call MD for:  temperature >100.4   Complete by: As directed    Diet - low sodium heart healthy   Complete by: As directed    Diet Carb Modified   Complete by: As directed    Discharge instructions   Complete by: As directed    Please call your urologist for hospital follow-up appointment   Increase activity  slowly   Complete by: As directed       Allergies as of 02/21/2021       Reactions   Poison Ivy Treatments Hives        Medication List     STOP taking these medications    cephALEXin 500 MG capsule Commonly known as: KEFLEX       TAKE these medications    allopurinol 300 MG tablet Commonly known as: ZYLOPRIM Take 300 mg by mouth every morning.   amantadine 100 MG capsule Commonly known as: SYMMETREL Take 100 mg by mouth 2 (two) times daily.   amLODipine 5 MG tablet Commonly known as: NORVASC Take 1 tablet (5 mg total) by mouth daily.   benzonatate 100 MG capsule Commonly known as: TESSALON Take 1 capsule (100 mg total) by mouth every 8 (eight) hours. What changed:  when to take this reasons to take this   carbidopa-levodopa 25-250 MG tablet Commonly known as: SINEMET IR Take 1 tablet by mouth See admin instructions. Take one tablet by mouth at 9am and 1pm   carbidopa-levodopa 50-200 MG tablet Commonly known as: SINEMET CR Take 1-2 tablets by mouth See admin instructions. Take one tablet by mouth at 9am and 1pm, take 2 tablets at 5pm and bedtime (9-10pm)   cyclobenzaprine 5 MG tablet Commonly known as: FLEXERIL Take 5 mg by mouth 3 (three) times daily as needed for muscle spasms.   diclofenac Sodium  1 % Gel Commonly known as: VOLTAREN Apply 2-4 g topically daily as needed (leg and hip pain).   Dificid 200 MG Tabs tablet Generic drug: fidaxomicin Take 1 tablet (200 mg total) by mouth 2 (two) times daily for 7 days.   donepezil 10 MG tablet Commonly known as: ARICEPT Take 1 tablet by mouth every day What changed: when to take this   finasteride 5 MG tablet Commonly known as: PROSCAR Take 1/2 tablet (2.5 mg total) by mouth daily.   gabapentin 600 MG tablet Commonly known as: NEURONTIN Take 600 mg by mouth every morning.   hydrOXYzine 50 MG tablet Commonly known as: ATARAX Take 50 mg by mouth at bedtime as needed (sleep).   melatonin 5 MG  Tabs Take 5 mg by mouth at bedtime as needed (sleep).   naloxone 4 MG/0.1ML Liqd nasal spray kit Commonly known as: NARCAN Place 4 mg into the nose once as needed (opioid overdose).   omeprazole 40 MG capsule Commonly known as: PRILOSEC Take 80 mg by mouth every morning.   ondansetron 4 MG disintegrating tablet Commonly known as: Zofran ODT Take 1 tablet (4 mg total) by mouth every 8 (eight) hours as needed for nausea or vomiting.   oxyCODONE-acetaminophen 10-325 MG tablet Commonly known as: PERCOCET Take 1 tablet by mouth 2 (two) times daily as needed for pain.   tamsulosin 0.4 MG Caps capsule Commonly known as: FLOMAX Take 1 capsule (0.4 mg total) by mouth daily after supper.   Vitamin D (Ergocalciferol) 1.25 MG (50000 UNIT) Caps capsule Commonly known as: DRISDOL Take 50,000 Units by mouth every Sunday.        Follow-up Information     [facility] Follow up.   Specialty: Home Health Services Why: Someone will call to schedule first home visit. Contact information: [place] [address] [phone]                Allergies  Allergen Reactions   Poison Ivy Treatments Hives    Consultations: Urology informal call from ED no formal consult    Procedures/Studies: DG Chest 1 View  Result Date: 02/17/2021 CLINICAL DATA:  Weakness.  Fall today. EXAM: CHEST  1 VIEW COMPARISON:  10/27/2020 FINDINGS: The heart size and mediastinal contours are within normal limits. Both lungs are clear. The visualized skeletal structures are unremarkable. Chronic fracture of the lowest right cervical fusion screw and probable loosening of the lowest left cervical fusion screw incidentally noted. IMPRESSION: No active disease. Electronically Signed   By: Nelson Chimes M.D.   On: 02/17/2021 10:13   DG Lumbar Spine Complete  Result Date: 02/17/2021 CLINICAL DATA:  Fall, low back pain EXAM: LUMBAR SPINE - COMPLETE 4+ VIEW COMPARISON:   02/13/2021 FINDINGS: Lumbar vertebral body heights and alignment are maintained. No fracture or traumatic listhesis. Intervertebral disc heights are relatively well preserved. Mild degenerative endplate changes. Aortic atherosclerosis. IMPRESSION: No acute fracture or traumatic listhesis of the lumbar spine. Electronically Signed   By: Davina Poke D.O.   On: 02/17/2021 10:10   DG Lumbar Spine Complete  Result Date: 02/13/2021 CLINICAL DATA:  Back pain. EXAM: LUMBAR SPINE - COMPLETE 4+ VIEW COMPARISON:  None. FINDINGS: There is no evidence of lumbar spine fracture. Alignment is normal. Mild multilevel degenerative endplate changes are noted from L3-S1. There are disc herniations with calcification of the disc annulus at L3-L4 and L4-L5. IMPRESSION: 1. No acute fracture or dislocation. 2. Multilevel degenerative changes. Electronically Signed  By: Brett Fairy M.D.   On: 02/13/2021 20:36   CT Cervical Spine Wo Contrast  Addendum Date: 02/17/2021   ADDENDUM REPORT: 02/17/2021 10:17 ADDENDUM: I had occasion to review this examination related to subsequent chest x-ray interpretation. It should be noted that there is chronic fracture of the right posterior C7 screw, present since at least 2016. It should be noted also that there is loosening of the posterior screw fragment on the right at C7 and of the entire left C7 screw. There is solid union from C3 through C6 but C6-7 remains a mobile level. Electronically Signed   By: Nelson Chimes M.D.   On: 02/17/2021 10:17   Result Date: 02/17/2021 CLINICAL DATA:  Cervical radiculopathy, mechanical fall earlier today. EXAM: CT CERVICAL SPINE WITHOUT CONTRAST TECHNIQUE: Multidetector CT imaging of the cervical spine was performed without intravenous contrast. Multiplanar CT image reconstructions were also generated. COMPARISON:  03/02/2020. FINDINGS: Alignment: Normal. Skull base and vertebrae: No acute fracture. Posterior cervical spinal fusion hardware is  present from C3-C6 with laminectomy changes. There is no evidence of hardware loosening. Soft tissues and spinal canal: Evaluation is limited due the presence of hardware artifact. No prevertebral fluid or swelling. No visible canal hematoma. Disc levels: Multilevel intervertebral disc space narrowing, degenerative endplate changes and facet arthropathy is noted, not well evaluated due to hardware artifact. Upper chest: Negative. Other: Atherosclerotic calcification of the carotid arteries IMPRESSION: . 1. No acute fracture. 2. Posterior cervical spinal fusion hardware from C3-C6 with no evidence of hardware loosening. 3. Multilevel degenerative changes. Limited evaluation of the spinal canal due to hardware artifact. Electronically Signed: By: Brett Fairy M.D. On: 02/13/2021 21:27   US RENAL  Result Date: 02/17/2021 CLINICAL DATA:  Acute kidney injury EXAM: RENAL / URINARY TRACT ULTRASOUND COMPLETE COMPARISON:  10/14/2020 FINDINGS: Right Kidney: Renal measurements: 10.0 by 4.6 by 5.7 cm = volume: 139 mL. Abnormal accentuated echogenicity is shown on image 7 series 1. 1.6 by 1.6 by 1.7 cm right kidney upper pole cyst with enhanced through transmission. No solid mass or hydronephrosis visualized. Left Kidney: Renal measurements: 9.5 by 6.2 by 4.6 cm = volume: 141 mL. Suspected mildly accentuated echogenicity. No mass or hydronephrosis visualized. Bladder: Appears normal for degree of bladder distention. Other: Below the urinary bladder, soft tissue possibly reflecting prostate tissue regrowth observed measuring 6.0 by 4.7 by 6.4 cm (volume = 94 cm^3). IMPRESSION: 1. Mildly accentuated renal echogenicity compared to the liver bilaterally, raising suspicion for chronic medical renal disease. 2. The patient has had a prior simple prostatectomy on 03/08/2016, cannot exclude regrowth of prostate tissue given the soft tissue echogenicity structure corresponding to prostate below the urinary bladder. Electronically  Signed   By: Van Clines M.D.   On: 02/17/2021 15:20   DG Hip Unilat W or Wo Pelvis 2-3 Views Left  Result Date: 02/13/2021 CLINICAL DATA:  Bilateral hip pain. EXAM: DG HIP (WITH OR WITHOUT PELVIS) 2-3V RIGHT; DG HIP (WITH OR WITHOUT PELVIS) 2-3V LEFT COMPARISON:  Radiograph dated 08/21/2019. FINDINGS: No acute fracture or dislocation. The bones are well mineralized. Mild bilateral hip arthritic changes, left greater right. The soft tissues are unremarkable. Vascular calcifications noted. IMPRESSION: Negative. Electronically Signed   By: Anner Crete M.D.   On: 02/13/2021 20:37   DG Hip Unilat W or Wo Pelvis 2-3 Views Right  Result Date: 02/17/2021 CLINICAL DATA:  Fall with low back pain and right hip pain EXAM: DG HIP (WITH OR WITHOUT PELVIS) 2-3V RIGHT COMPARISON:  02/13/2021 FINDINGS: No change. No evidence of acute regional fracture. Chronic sacroiliac osteoarthritis as seen previously. IMPRESSION: No acute or traumatic finding. Chronic sacroiliac osteoarthritis. Electronically Signed   By: Nelson Chimes M.D.   On: 02/17/2021 10:11   DG Hip Unilat W or Wo Pelvis 2-3 Views Right  Result Date: 02/13/2021 CLINICAL DATA:  Bilateral hip pain. EXAM: DG HIP (WITH OR WITHOUT PELVIS) 2-3V RIGHT; DG HIP (WITH OR WITHOUT PELVIS) 2-3V LEFT COMPARISON:  Radiograph dated 08/21/2019. FINDINGS: No acute fracture or dislocation. The bones are well mineralized. Mild bilateral hip arthritic changes, left greater right. The soft tissues are unremarkable. Vascular calcifications noted. IMPRESSION: Negative. Electronically Signed   By: Anner Crete M.D.   On: 02/13/2021 20:37      Subjective: Pt reports doing well today and ready to go home. SNF was discussed vs home w/ HH and heopts for [facility] which was arranged for discharge today    Discharge Exam: Vitals:   02/21/21 0500 02/21/21 0847  BP: 119/83 135/72  Pulse: 79 81  Resp: 18 18  Temp: 98.4 F (36.9 C) 97.9 F (36.6 C)  SpO2: 100% 100%    Vitals:   02/20/21 1653 02/20/21 2107 02/21/21 0500 02/21/21 0847  BP: 111/66 120/81 119/83 135/72  Pulse: 76 76 79 81  Resp: 18 16 18 18   Temp: (!) 97.5 F (36.4 C) 98.2 F (36.8 C) 98.4 F (36.9 C) 97.9 F (36.6 C)  TempSrc: Oral   Oral  SpO2: 99% 98% 100% 100%  Weight:      Height:        General: Pt is alert, awake, not in acute distress Cardiovascular: RRR, S1/S2 +, no rubs, no gallops Respiratory: CTA bilaterally, no wheezing, no rhonchi Abdominal: Soft, NT, ND, bowel sounds + Extremities: no edema, no cyanosis    The results of significant diagnostics from this hospitalization (including imaging, microbiology, ancillary and laboratory) are listed below for reference.     Microbiology: Recent Results (from the past 240 hour(s))  Blood Culture (routine x 2)     Status: None   Collection Time: 02/17/21  9:30 AM   Specimen: BLOOD RIGHT ARM  Result Value Ref Range Status   Specimen Description BLOOD RIGHT ARM  Final   Special Requests   Final    BOTTLES DRAWN AEROBIC AND ANAEROBIC Blood Culture results may not be optimal due to an inadequate volume of blood received in culture bottles   Culture   Final    NO GROWTH 5 DAYS Performed at [facility], [place] [address], [place], [place] [address]    Report Status 02/22/2021 FINAL  Final  Blood Culture (routine x 2)     Status: None   Collection Time: 02/17/21 10:25 AM   Specimen: BLOOD  Result Value Ref Range Status   Specimen Description BLOOD SITE NOT SPECIFIED  Final   Special Requests   Final    BOTTLES DRAWN AEROBIC AND ANAEROBIC Blood Culture adequate volume   Culture   Final    NO GROWTH 5 DAYS Performed at [facility], [address], [place],  [address]    Report Status 02/22/2021 FINAL  Final  Resp Panel by RT-PCR (Flu A&B, Covid) Nasopharyngeal Swab     Status: None   Collection Time: 02/17/21 10:55 AM   Specimen: Nasopharyngeal Swab; Nasopharyngeal(NP) swabs in vial transport  medium  Result Value Ref Range Status   SARS Coronavirus 2 by RT PCR NEGATIVE NEGATIVE Final  Comment: (NOTE) SARS-CoV-2 target nucleic acids are NOT DETECTED.  The SARS-CoV-2 RNA is generally detectable in upper respiratory specimens during the acute phase of infection. The lowest concentration of SARS-CoV-2 viral copies this assay can detect is 138 copies/mL. A negative result does not preclude SARS-Cov-2 infection and should not be used as the sole basis for treatment or other patient management decisions. A negative result may occur with  improper specimen collection/handling, submission of specimen other than nasopharyngeal swab, presence of viral mutation(s) within the areas targeted by this assay, and inadequate number of viral copies(<138 copies/mL). A negative result must be combined with clinical observations, patient history, and epidemiological information. The expected result is Negative.  Fact Sheet for Patients:  EntrepreneurPulse.com.au  Fact Sheet for Healthcare Providers:  IncredibleEmployment.be  This test is no t yet approved or cleared by the [place] FDA and  has been authorized for detection and/or diagnosis of SARS-CoV-2 by FDA under an Emergency Use Authorization (EUA). This EUA will remain  in effect (meaning this test can be used) for the duration of the COVID-19 declaration under Section 564(b)(1) of the Act, 21 U.S.C.section 360bbb-3(b)(1), unless the authorization is terminated  or revoked sooner.       Influenza A by PCR NEGATIVE NEGATIVE Final   Influenza B by PCR NEGATIVE NEGATIVE Final    Comment: (NOTE) The Xpert Xpress SARS-CoV-2/FLU/RSV plus assay is intended as an aid in the diagnosis of influenza from Nasopharyngeal swab specimens and should not be used as a sole basis for treatment. Nasal washings and aspirates are unacceptable for Xpert Xpress SARS-CoV-2/FLU/RSV testing.  Fact Sheet for  Patients: EntrepreneurPulse.com.au  Fact Sheet for Healthcare Providers: IncredibleEmployment.be  This test is not yet approved or cleared by the [place] FDA and has been authorized for detection and/or diagnosis of SARS-CoV-2 by FDA under an Emergency Use Authorization (EUA). This EUA will remain in effect (meaning this test can be used) for the duration of the COVID-19 declaration under Section 564(b)(1) of the Act, 21 U.S.C. section 360bbb-3(b)(1), unless the authorization is terminated or revoked.  Performed at [facility], [place] [address], [place], [place] [address]   Urine Culture     Status: None   Collection Time: 02/17/21  2:55 PM   Specimen: In/Out Cath Urine  Result Value Ref Range Status   Specimen Description IN/OUT CATH URINE  Final   Special Requests NONE  Final   Culture   Final    NO GROWTH Performed at [facility], [place] [address] [place],  [address]    Report Status 02/18/2021 FINAL  Final  Gastrointestinal Panel by PCR , Stool     Status: None   Collection Time: 02/18/21  8:05 AM   Specimen: STOOL  Result Value Ref Range Status   Campylobacter species NOT DETECTED NOT DETECTED Final   Plesimonas shigelloides NOT DETECTED NOT DETECTED Final   Salmonella species NOT DETECTED NOT DETECTED Final   Yersinia enterocolitica NOT DETECTED NOT DETECTED Final   Vibrio species NOT DETECTED NOT DETECTED Final   Vibrio cholerae NOT DETECTED NOT DETECTED Final   Enteroaggregative E coli (EAEC) NOT DETECTED NOT DETECTED Final   Enteropathogenic E coli (EPEC) NOT DETECTED NOT DETECTED Final   Enterotoxigenic E coli (ETEC) NOT DETECTED NOT DETECTED Final   Shiga like toxin producing E coli (STEC) NOT DETECTED NOT DETECTED Final   Shigella/Enteroinvasive E coli (EIEC) NOT DETECTED NOT DETECTED Final   Cryptosporidium NOT DETECTED NOT DETECTED Final  Cyclospora cayetanensis NOT DETECTED NOT DETECTED Final    Entamoeba histolytica NOT DETECTED NOT DETECTED Final   Giardia lamblia NOT DETECTED NOT DETECTED Final   Adenovirus F40/41 NOT DETECTED NOT DETECTED Final   Astrovirus NOT DETECTED NOT DETECTED Final   Norovirus GI/GII NOT DETECTED NOT DETECTED Final   Rotavirus A NOT DETECTED NOT DETECTED Final   Sapovirus (I, II, IV, and V) NOT DETECTED NOT DETECTED Final    Comment: Performed at [facility], [address], [place], [place] [address]  C Difficile Quick Screen w PCR reflex     Status: Abnormal   Collection Time: 02/18/21  8:05 AM   Specimen: STOOL  Result Value Ref Range Status   C Diff antigen POSITIVE (A) NEGATIVE Final   C Diff toxin NEGATIVE NEGATIVE Final   C Diff interpretation Results are indeterminate. See PCR results.  Final    Comment: Performed at [facility], [place] [address], [place], [place] [address]  C. Diff by PCR, Reflexed     Status: Abnormal   Collection Time: 02/18/21  8:05 AM  Result Value Ref Range Status   Toxigenic C. Difficile by PCR POSITIVE (A) NEGATIVE Final    Comment: Positive for toxigenic C. difficile with little to no toxin production. Only treat if clinical presentation suggests symptomatic illness. Performed at [facility], [place] [address], [place], [place] [address]      Labs: BNP (last 3 results) No results for input(s): BNP in the last 8760 hours. Basic Metabolic Panel: Recent Labs  Lab 02/20/21 0845 02/21/21 0643  NA 136 136  K 3.3* 3.5  CL 101 107  CO2 26 23  GLUCOSE 108* 82  BUN 21 20  CREATININE 1.85* 1.76*  CALCIUM 9.0 8.4*  MG 1.8  --    Liver Function Tests: No results for input(s): AST, ALT, ALKPHOS, BILITOT, PROT, ALBUMIN in the last 168 hours. No results for input(s): LIPASE, AMYLASE in the last 168 hours. No results for input(s): AMMONIA in the last 168 hours. CBC: Recent Labs  Lab 02/20/21 0845  WBC 6.3  HGB 14.2  HCT 45.5  MCV 90.8  PLT 207   Cardiac Enzymes: No results for input(s):  CKTOTAL, CKMB, CKMBINDEX, TROPONINI in the last 168 hours. BNP: Invalid input(s): POCBNP CBG: Recent Labs  Lab 02/20/21 1641 02/20/21 2103 02/21/21 0631 02/21/21 0843 02/21/21 1109  GLUCAP 82 113* 81 102* 87   D-Dimer No results for input(s): DDIMER in the last 72 hours. Hgb A1c No results for input(s): HGBA1C in the last 72 hours. Lipid Profile No results for input(s): CHOL, HDL, LDLCALC, TRIG, CHOLHDL, LDLDIRECT in the last 72 hours. Thyroid function studies No results for input(s): TSH, T4TOTAL, T3FREE, THYROIDAB in the last 72 hours.  Invalid input(s): FREET3 Anemia work up No results for input(s): VITAMINB12, FOLATE, FERRITIN, TIBC, IRON, RETICCTPCT in the last 72 hours. Urinalysis    Component Value Date/Time   COLORURINE AMBER (A) 02/17/2021 1455   APPEARANCEUR CLOUDY (A) 02/17/2021 1455   LABSPEC 1.020 02/17/2021 1455   PHURINE 5.5 02/17/2021 1455   GLUCOSEU 100 (A) 02/17/2021 1455   HGBUR LARGE (A) 02/17/2021 1455   BILIRUBINUR NEGATIVE 02/17/2021 1455   BILIRUBINUR NEGATIVE 05/27/2014 1020   KETONESUR NEGATIVE 02/17/2021 1455   PROTEINUR 30 (A) 02/17/2021 1455   UROBILINOGEN 1.0 10/29/2014 0042   NITRITE NEGATIVE 02/17/2021 1455   LEUKOCYTESUR TRACE (A) 02/17/2021 1455   Sepsis Labs Invalid input(s): PROCALCITONIN,  WBC,  LACTICIDVEN  Microbiology Recent Results (from the past 240 hour(s))  Blood Culture (routine x 2)     Status: None   Collection Time: 02/17/21  9:30 AM   Specimen: BLOOD RIGHT ARM  Result Value Ref Range Status   Specimen Description BLOOD RIGHT ARM  Final   Special Requests   Final    BOTTLES DRAWN AEROBIC AND ANAEROBIC Blood Culture results may not be optimal due to an inadequate volume of blood received in culture bottles   Culture   Final    NO GROWTH 5 DAYS Performed at [facility], [place] [address] [place], [place] [address]    Report Status 02/22/2021 FINAL  Final  Blood Culture (routine x 2)     Status: None    Collection Time: 02/17/21 10:25 AM   Specimen: BLOOD  Result Value Ref Range Status   Specimen Description BLOOD SITE NOT SPECIFIED  Final   Special Requests   Final    BOTTLES DRAWN AEROBIC AND ANAEROBIC Blood Culture adequate volume   Culture   Final    NO GROWTH 5 DAYS Performed at [facility], [address] [place], [place] [address]    Report Status 02/22/2021 FINAL  Final  Resp Panel by RT-PCR (Flu A&B, Covid) Nasopharyngeal Swab     Status: None   Collection Time: 02/17/21 10:55 AM   Specimen: Nasopharyngeal Swab; Nasopharyngeal(NP) swabs in vial transport medium  Result Value Ref Range Status   SARS Coronavirus 2 by RT PCR NEGATIVE NEGATIVE Final    Comment: (NOTE) SARS-CoV-2 target nucleic acids are NOT DETECTED.  The SARS-CoV-2 RNA is generally detectable in upper respiratory specimens during the acute phase of infection. The lowest concentration of SARS-CoV-2 viral copies this assay can detect is 138 copies/mL. A negative result does not preclude SARS-Cov-2 infection and should not be used as the sole basis for treatment or other patient management decisions. A negative result may occur with  improper specimen collection/handling, submission of specimen other than nasopharyngeal swab, presence of viral mutation(s) within the areas targeted by this assay, and inadequate number of viral copies(<138 copies/mL). A negative result must be combined with clinical observations, patient history, and epidemiological information. The expected result is Negative.  Fact Sheet for Patients:  EntrepreneurPulse.com.au  Fact Sheet for Healthcare Providers:  IncredibleEmployment.be  This test is no t yet approved or cleared by the [place] FDA and  has been authorized for detection and/or diagnosis of SARS-CoV-2 by FDA under an Emergency Use Authorization (EUA). This EUA will remain  in effect (meaning this test can be used) for the  duration of the COVID-19 declaration under Section 564(b)(1) of the Act, 21 U.S.C.section 360bbb-3(b)(1), unless the authorization is terminated  or revoked sooner.       Influenza A by PCR NEGATIVE NEGATIVE Final   Influenza B by PCR NEGATIVE NEGATIVE Final    Comment: (NOTE) The Xpert Xpress SARS-CoV-2/FLU/RSV plus assay is intended as an aid in the diagnosis of influenza from Nasopharyngeal swab specimens and should not be used as a sole basis for treatment. Nasal washings and aspirates are unacceptable for Xpert Xpress SARS-CoV-2/FLU/RSV testing.  Fact Sheet for Patients: EntrepreneurPulse.com.au  Fact Sheet for Healthcare Providers: IncredibleEmployment.be  This test is not yet approved or cleared by the [place] FDA and has been authorized for detection and/or diagnosis of SARS-CoV-2 by FDA under an Emergency Use Authorization (EUA). This EUA will remain in effect (meaning this test can be used) for the duration  of the COVID-19 declaration under Section 564(b)(1) of the Act, 21 U.S.C. section 360bbb-3(b)(1), unless the authorization is terminated or revoked.  Performed at [facility], [place] [address], [place], [place] [address]   Urine Culture     Status: None   Collection Time: 02/17/21  2:55 PM   Specimen: In/Out Cath Urine  Result Value Ref Range Status   Specimen Description IN/OUT CATH URINE  Final   Special Requests NONE  Final   Culture   Final    NO GROWTH Performed at [facility], [place] [address] [place], [place] [address]    Report Status 02/18/2021 FINAL  Final  Gastrointestinal Panel by PCR , Stool     Status: None   Collection Time: 02/18/21  8:05 AM   Specimen: STOOL  Result Value Ref Range Status   Campylobacter species NOT DETECTED NOT DETECTED Final   Plesimonas shigelloides NOT DETECTED NOT DETECTED Final   Salmonella species NOT DETECTED NOT DETECTED Final   Yersinia enterocolitica NOT  DETECTED NOT DETECTED Final   Vibrio species NOT DETECTED NOT DETECTED Final   Vibrio cholerae NOT DETECTED NOT DETECTED Final   Enteroaggregative E coli (EAEC) NOT DETECTED NOT DETECTED Final   Enteropathogenic E coli (EPEC) NOT DETECTED NOT DETECTED Final   Enterotoxigenic E coli (ETEC) NOT DETECTED NOT DETECTED Final   Shiga like toxin producing E coli (STEC) NOT DETECTED NOT DETECTED Final   Shigella/Enteroinvasive E coli (EIEC) NOT DETECTED NOT DETECTED Final   Cryptosporidium NOT DETECTED NOT DETECTED Final   Cyclospora cayetanensis NOT DETECTED NOT DETECTED Final   Entamoeba histolytica NOT DETECTED NOT DETECTED Final   Giardia lamblia NOT DETECTED NOT DETECTED Final   Adenovirus F40/41 NOT DETECTED NOT DETECTED Final   Astrovirus NOT DETECTED NOT DETECTED Final   Norovirus GI/GII NOT DETECTED NOT DETECTED Final   Rotavirus A NOT DETECTED NOT DETECTED Final   Sapovirus (I, II, IV, and V) NOT DETECTED NOT DETECTED Final    Comment: Performed at [facility], [place] [place], [place] [address]  C Difficile Quick Screen w PCR reflex     Status: Abnormal   Collection Time: 02/18/21  8:05 AM   Specimen: STOOL  Result Value Ref Range Status   C Diff antigen POSITIVE (A) NEGATIVE Final   C Diff toxin NEGATIVE NEGATIVE Final   C Diff interpretation Results are indeterminate. See PCR results.  Final    Comment: Performed at [facility], [place] [address], [place], [place] [address]  C. Diff by PCR, Reflexed     Status: Abnormal   Collection Time: 02/18/21  8:05 AM  Result Value Ref Range Status   Toxigenic C. Difficile by PCR POSITIVE (A) NEGATIVE Final    Comment: Positive for toxigenic C. difficile with little to no toxin production. Only treat if clinical presentation suggests symptomatic illness. Performed at [facility], [place] [address] [place], [place] [address]      Time coordinating discharge: Over 30 minutes  SIGNED:   Emeterio Reeve,  MD  [facility] 02/26/2021, 8:02 PM Pager   If 7PM-7AM, please contact night-coverage [email or web address] Password [email or web address]

## 2021-03-20 ENCOUNTER — Inpatient Hospital Stay (HOSPITAL_COMMUNITY)
Admission: EM | Admit: 2021-03-20 | Discharge: 2021-03-23 | DRG: 372 | Disposition: A | Discharge status: Home / Self Care | Payer: Medicare Other | Attending: Internal Medicine | Admitting: Internal Medicine

## 2021-03-20 ENCOUNTER — Ambulatory Visit (HOSPITAL_COMMUNITY)
Admission: EM | Admit: 2021-03-20 | Discharge: 2021-03-20 | Disposition: A | Discharge status: Home / Self Care | Payer: Medicare Other

## 2021-03-20 ENCOUNTER — Encounter (HOSPITAL_COMMUNITY): Payer: Self-pay | Admitting: *Deleted

## 2021-03-20 ENCOUNTER — Other Ambulatory Visit: Payer: Self-pay

## 2021-03-20 DIAGNOSIS — N183 Chronic kidney disease, stage 3 unspecified: Secondary | ICD-10-CM | POA: Diagnosis present

## 2021-03-20 DIAGNOSIS — R197 Diarrhea, unspecified: Secondary | ICD-10-CM

## 2021-03-20 DIAGNOSIS — E1122 Type 2 diabetes mellitus with diabetic chronic kidney disease: Secondary | ICD-10-CM | POA: Diagnosis present

## 2021-03-20 DIAGNOSIS — E118 Type 2 diabetes mellitus with unspecified complications: Secondary | ICD-10-CM | POA: Diagnosis present

## 2021-03-20 DIAGNOSIS — I10 Essential (primary) hypertension: Secondary | ICD-10-CM | POA: Diagnosis present

## 2021-03-20 DIAGNOSIS — E860 Dehydration: Secondary | ICD-10-CM

## 2021-03-20 DIAGNOSIS — Z20822 Contact with and (suspected) exposure to covid-19: Secondary | ICD-10-CM | POA: Diagnosis present

## 2021-03-20 DIAGNOSIS — N179 Acute kidney failure, unspecified: Secondary | ICD-10-CM | POA: Diagnosis present

## 2021-03-20 DIAGNOSIS — I5032 Chronic diastolic (congestive) heart failure: Secondary | ICD-10-CM | POA: Diagnosis present

## 2021-03-20 DIAGNOSIS — Z66 Do not resuscitate: Secondary | ICD-10-CM | POA: Diagnosis present

## 2021-03-20 DIAGNOSIS — R413 Other amnesia: Secondary | ICD-10-CM | POA: Diagnosis present

## 2021-03-20 DIAGNOSIS — G20 Parkinson's disease: Secondary | ICD-10-CM | POA: Diagnosis present

## 2021-03-20 DIAGNOSIS — A0472 Enterocolitis due to Clostridium difficile, not specified as recurrent: Secondary | ICD-10-CM | POA: Diagnosis not present

## 2021-03-20 DIAGNOSIS — I130 Hypertensive heart and chronic kidney disease with heart failure and stage 1 through stage 4 chronic kidney disease, or unspecified chronic kidney disease: Secondary | ICD-10-CM | POA: Diagnosis present

## 2021-03-20 DIAGNOSIS — R338 Other retention of urine: Secondary | ICD-10-CM | POA: Diagnosis present

## 2021-03-20 DIAGNOSIS — N178 Other acute kidney failure: Secondary | ICD-10-CM

## 2021-03-20 DIAGNOSIS — N401 Enlarged prostate with lower urinary tract symptoms: Secondary | ICD-10-CM | POA: Diagnosis present

## 2021-03-20 DIAGNOSIS — N400 Enlarged prostate without lower urinary tract symptoms: Secondary | ICD-10-CM | POA: Diagnosis present

## 2021-03-20 DIAGNOSIS — N1832 Chronic kidney disease, stage 3b: Secondary | ICD-10-CM | POA: Diagnosis present

## 2021-03-20 DIAGNOSIS — Z981 Arthrodesis status: Secondary | ICD-10-CM

## 2021-03-20 LAB — COMPREHENSIVE METABOLIC PANEL
ALT: 6 U/L (ref 0–44)
AST: 11 U/L — ABNORMAL LOW (ref 15–41)
Albumin: 3.3 g/dL — ABNORMAL LOW (ref 3.5–5.0)
Alkaline Phosphatase: 55 U/L (ref 38–126)
Anion gap: 11 (ref 5–15)
BUN: 45 mg/dL — ABNORMAL HIGH (ref 8–23)
CO2: 24 mmol/L (ref 22–32)
Calcium: 9.1 mg/dL (ref 8.9–10.3)
Chloride: 106 mmol/L (ref 98–111)
Creatinine, Ser: 3.13 mg/dL — ABNORMAL HIGH (ref 0.61–1.24)
GFR, Estimated: 20 mL/min — ABNORMAL LOW (ref >60)
Glucose, Bld: 105 mg/dL — ABNORMAL HIGH (ref 70–99)
Potassium: 3.6 mmol/L (ref 3.5–5.1)
Sodium: 141 mmol/L (ref 135–145)
Total Bilirubin: 0.5 mg/dL (ref 0.3–1.2)
Total Protein: 7.8 g/dL (ref 6.5–8.1)

## 2021-03-20 LAB — LIPASE, BLOOD: Lipase: 60 U/L — ABNORMAL HIGH (ref 11–51)

## 2021-03-20 LAB — CBC
HCT: 49.7 % (ref 39.0–52.0)
Hemoglobin: 15.2 g/dL (ref 13.0–17.0)
MCH: 27.5 pg (ref 26.0–34.0)
MCHC: 30.6 g/dL (ref 30.0–36.0)
MCV: 90 fL (ref 80.0–100.0)
Platelets: 229 10*3/uL (ref 150–400)
RBC: 5.52 MIL/uL (ref 4.22–5.81)
RDW: 13.7 % (ref 11.5–15.5)
WBC: 8.4 10*3/uL (ref 4.0–10.5)
nRBC: 0 % (ref 0.0–0.2)

## 2021-03-20 NOTE — ED Provider Triage Note (Signed)
Emergency Medicine Provider Triage Evaluation Note  Tyler Lester , a 76 y.o. male  was evaluated in triage.  Pt complains of diarrhea for the past 10 days.  Patient had been on antibiotics for urinary tract infection and has tried multiple antibiotics.  Unsure which one he has been taking recently.  Denies any nausea, vomiting or blood in the stool.  Has had 1-2 normal stools over the past 10 days.  Review of Systems    Physical Exam  BP (!) 136/118    Pulse (!) 106    Temp 98.4 F (36.9 C) (Oral)    Resp 18    SpO2 100%  Gen:   Awake, no distress   Resp:  Normal effort  MSK:   Moves extremities without difficulty  Other:  Ill-appearing, tachycardic  Medical Decision Making  Medically screening exam initiated at 6:17 PM.  Appropriate orders placed.  Glenetta Borg was informed that the remainder of the evaluation will be completed by another provider, this initial triage assessment does not replace that evaluation, and the importance of remaining in the ED until their evaluation is complete.     Rhae Hammock, PA-C 03/20/21 1818

## 2021-03-20 NOTE — ED Notes (Signed)
Pt called care giver and this writer reported Pt needed to go to ED for blood work and IVfluids.

## 2021-03-20 NOTE — ED Triage Notes (Signed)
Pt reports that he's had diarrhea x 1 week. Went to UC for same today and they sent him here for hypotension, however is hypertensive on arrival to ED.

## 2021-03-20 NOTE — Discharge Instructions (Addendum)
-  I am concerned about your uncontrollable diarrhea.  It is possible that you have an infection in your colon, since you were recently on antibiotics I am concerned about something called C. difficile.  You also are dehydrated as evidenced by your low blood pressure, and likely need IV fluids.  You also need rapid blood work which we cannot do at urgent care.  Please head to the emergency department where they can hopefully figure out the cause of your diarrhea, replenish your fluids, replenish electrolytes if necessary, perform imaging if necessary.

## 2021-03-20 NOTE — ED Provider Notes (Addendum)
[facility]    CSN: [id number] Arrival date & time: 03/20/21  1404      History   Chief Complaint Chief Complaint  Patient presents with   Diarrhea    HPI Tyler Lester is a 76 y.o. male presenting with diarrhea for about 10 days.  Medical history inguinal hernia, Parkinson disease, urinary retention, sepsis 1 month ago of unknown origin treated with multiple antibiotics including vancomycin and flagyl.  Describes 10 days of watery diarrhea, states every time he eats he poops it right back out.  Diarrhea is watery, without blood or mucus.  Denies associated nausea, vomiting.  Minimal abdominal pain.  Denies urinary symptoms.  HPI  Past Medical History:  Diagnosis Date   Bilateral foot pain    Chronic pain    Diabetes mellitus    type 2, diet controlled now   Foley catheter in place since nov 2018, last changed 1 week ago   Hyperlipidemia    Hypertension    Inguinal hernia    right   Numbness    T/O   Parkinson disease ([place]) 08/26/2015   Followed by [facility]. Last visit 06/03/2015. History of poor compliance with his medication. Referred him back to [facility] in 10/2016. They didn't want to see him back due to poor compliance with medical advise. 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   Memory change 11/09/2016   Diabetic polyneuropathy associated with type 2 diabetes mellitus ([place]) 04/13/2016   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   Diminished hearing, left 01/20/2016   Type 2 diabetes mellitus with  complication ([place]) 22/97/9892   Gait abnormality 03/28/2015   Postlaminectomy syndrome, cervical region 07/23/2014   Parkinson's disease ([place]) 07/23/2014   Back pain with sciatica 05/27/2014   Bilateral leg pain 05/14/2014   Depression 10/15/2013   CKD (chronic kidney disease) stage 3, GFR 30-59 ml/min ([facility]) 08/25/2013   Tremor 05/23/2013   Acute renal failure superimposed on stage 3 chronic kidney disease ([place]) 05/08/2013   Chronic diastolic heart failure ([place]) 05/06/2013   Onychogryposis of toenail 05/14/2012   Essential hypertension 01/05/2011    Past Surgical History:  Procedure Laterality Date   CATARACT EXTRACTION W/ INTRAOCULAR LENS IMPLANT Left 06/2012   laser for posterior capsule?   COLON SURGERY     POSTERIOR CERVICAL FUSION/FORAMINOTOMY N/A 11/12/2013   Procedure: Posterior cervical decompression fusion, cervical 3-4, cervical 4-5, cervical 5-6, cervical 6-7 with instrumentation and allograft   (LEVEL 4);  Surgeon: Sinclair Ship, MD;  Location: [facility];  Service: Orthopedics;  Laterality: N/A;  Posterior cervical decompression fusion, cervical 3-4, cervical 4-5, cervical 5-6, cervical 6-7 with instrumentation and allograft   TONSILLECTOMY     XI ROBOTIC ASSISTED SIMPLE PROSTATECTOMY N/A 03/07/2016   Procedure: XI ROBOTIC ASSISTED SIMPLE PROSTATECTOMY AND UMBILICAL HERNIA REPAIR flexible cystoscopy;  Surgeon: Alexis Frock, MD;  Location: [facility];  Service: Urology;  Laterality: N/A;       Home Medications    Prior to Admission  medications   Medication Sig Start Date End Date Taking? Authorizing Provider  allopurinol (ZYLOPRIM) 300 MG tablet Take 300 mg by mouth every morning. 02/21/20   [provider]  amantadine (SYMMETREL) 100 MG capsule Take 100 mg by mouth 2 (two) times daily. 09/29/20   [provider]  amLODipine (NORVASC) 5 MG tablet Take 1 tablet (5 mg total) by mouth daily. Patient not taking: Reported on 02/18/2021 03/04/20 10/13/20  Kayleen Memos, DO  benzonatate (TESSALON) 100 MG capsule Take 1 capsule (100 mg total) by mouth every 8 (eight) hours. Patient taking differently: Take 100 mg by mouth every 8 (eight) hours as needed for cough. 10/27/20   Raspet, Junie Panning K, PA-C  carbidopa-levodopa (SINEMET CR) 50-200 MG tablet Take 1-2 tablets by mouth See admin instructions. Take one tablet by mouth at 9am and 1pm, take 2 tablets at 5pm and bedtime (9-10pm) 11/29/20   [provider]  carbidopa-levodopa (SINEMET IR) 25-250 MG tablet Take 1 tablet by mouth See admin instructions. Take one tablet by mouth at 9am and 1pm    [provider]  cyclobenzaprine (FLEXERIL) 5 MG tablet Take 5 mg by mouth 3 (three) times daily as needed for muscle spasms. 02/05/20   [provider]  diclofenac Sodium (VOLTAREN) 1 % GEL Apply 2-4 g topically daily as needed (leg and hip pain). 10/04/20   [provider]  donepezil (ARICEPT) 10 MG tablet Take 1 tablet by mouth every day Patient taking differently: Take 10 mg by mouth every morning. 12/15/19   Wilber Oliphant, MD  finasteride (PROSCAR) 5 MG tablet Take 1/2 tablet (2.5 mg total) by mouth daily. 02/22/21   Emeterio Reeve, DO  gabapentin (NEURONTIN) 600 MG tablet Take 600 mg by mouth every morning. 10/04/20   [provider]  hydrOXYzine (ATARAX) 50 MG tablet Take 50 mg by mouth at bedtime as needed (sleep). 02/04/21   [provider]  melatonin 5 MG TABS Take 5 mg by mouth at bedtime as needed (sleep).    [provider]  naloxone [facility]) nasal spray 4 mg/0.1 mL Place 4 mg into the nose once as needed (opioid overdose). 10/04/20   [provider]  omeprazole (PRILOSEC) 40 MG capsule Take 80 mg by mouth every morning. 09/10/20   [provider]  ondansetron (ZOFRAN ODT) 4 MG disintegrating tablet Take 1 tablet (4 mg total) by mouth every 8 (eight) hours as needed for nausea or vomiting. Patient not taking: Reported on 02/18/2021  04/18/20   Sharion Balloon, NP  oxyCODONE-acetaminophen (PERCOCET) 10-325 MG tablet Take 1 tablet by mouth 2 (two) times daily as needed for pain. 02/04/21   [provider]  tamsulosin (FLOMAX) 0.4 MG CAPS capsule Take 1 capsule (0.4 mg total) by mouth daily after supper. 02/21/21   Emeterio Reeve, DO  Vitamin D, Ergocalciferol, (DRISDOL) 1.25 MG (50000 UNIT) CAPS capsule Take 50,000 Units by mouth every Sunday. 02/04/20   [provider]    Family History Family History  Problem Relation Age of Onset   Cancer - Other Other    Diabetes Other    Hypertension Other     Social History Social History   Tobacco Use   Smoking status: Never   Smokeless tobacco: Never  Vaping Use   Vaping Use: Never used  Substance Use Topics   Alcohol use: Yes    Alcohol/week: 0.0 standard drinks    Comment: 1 shot a day   Drug use: No  Allergies   Poison ivy treatments   Review of Systems Review of Systems  Gastrointestinal:  Positive for diarrhea.  All other systems reviewed and are negative.   Physical Exam Triage Vital Signs ED Triage Vitals  Enc Vitals Group     BP 03/20/21 1518 (!) 93/54     Pulse Rate 03/20/21 1518 100     Resp 03/20/21 1518 20     Temp 03/20/21 1518 97.7 F (36.5 C)     Temp src --      SpO2 03/20/21 1518 95 %     Weight --      Height --      Head Circumference --      Peak Flow --      Pain Score 03/20/21 1515 0     Pain Loc --      Pain Edu? --      Excl. in [place]? --    No data found.  Updated Vital Signs BP (!) 93/54    Pulse 100    Temp 97.7 F (36.5 C)    Resp 20    SpO2 95%   Visual Acuity Right Eye Distance:   Left Eye Distance:   Bilateral Distance:    Right Eye Near:   Left Eye Near:    Bilateral Near:     Physical Exam Vitals reviewed.  Constitutional:      General: He is not in acute distress.    Appearance: Normal appearance. He is cachectic. He is not ill-appearing.  HENT:     Head: Normocephalic and  atraumatic.     Mouth/Throat:     Mouth: Mucous membranes are moist.     Comments: Moist mucous membranes Eyes:     Extraocular Movements: Extraocular movements intact.     Pupils: Pupils are equal, round, and reactive to light.  Cardiovascular:     Rate and Rhythm: Normal rate and regular rhythm.     Heart sounds: Normal heart sounds.  Pulmonary:     Effort: Pulmonary effort is normal.     Breath sounds: Normal breath sounds. No wheezing, rhonchi or rales.  Abdominal:     General: Bowel sounds are increased. There is no distension.     Palpations: Abdomen is soft. There is no mass.     Tenderness: There is no abdominal tenderness. There is no right CVA tenderness, left CVA tenderness, guarding or rebound.  Skin:    General: Skin is warm.     Capillary Refill: Capillary refill takes 2 to 3 seconds.  Neurological:     General: No focal deficit present.     Mental Status: He is alert and oriented to person, place, and time.  Psychiatric:        Mood and Affect: Mood normal.        Behavior: Behavior normal.     UC Treatments / Results  Labs (all labs ordered are listed, but only abnormal results are displayed) Labs Reviewed - No data to display  EKG   Radiology No results found.  Procedures Procedures (including critical care time)  Medications Ordered in UC Medications - No data to display  Initial Impression / Assessment and Plan / UC Course  I have reviewed the triage vital signs and the nursing notes.  Pertinent labs & imaging results that were available during my care of the patient were reviewed by me and considered in my medical decision making (see chart for details).     This patient  is a very pleasant 76 y.o. year old male presenting with diarrhea of unknown origin x10 days. He did have sepsis 1 month ago of unknown origin treated with multiple antibiotics including vancomycin and flagyl. He appears dehydrated today with hypotension at 93/54 and  tachycardia. Given recent abx cannot exclude c dif. He would also benefit from IV fluids and rapid labwork that is not possible at urgent care particularly given time of day. Sent to ED via personal vehicle.  Level 5 as he was admitted for AKI.  Final Clinical Impressions(s) / UC Diagnoses   Final diagnoses:  Diarrhea of presumed infectious origin  Dehydration     Discharge Instructions      -I am concerned about your uncontrollable diarrhea.  It is possible that you have an infection in your colon, since you were recently on antibiotics I am concerned about something called C. difficile.  You also are dehydrated as evidenced by your low blood pressure, and likely need IV fluids.  You also need rapid blood work which we cannot do at urgent care.  Please head to the emergency department where they can hopefully figure out the cause of your diarrhea, replenish your fluids, replenish electrolytes if necessary, perform imaging if necessary.     ED Prescriptions   None    PDMP not reviewed this encounter.   [name] Sams, PA-C 03/20/21 1543    [name] Sams, PA-C 03/21/21 1355

## 2021-03-20 NOTE — ED Triage Notes (Signed)
Pt reports he has had diarrhea for one week . Pt reports he has approx. 3 diarrhea stolls a day. Pt eports he is hungry and when he does eat the diarrhea starts up again.

## 2021-03-21 ENCOUNTER — Emergency Department (HOSPITAL_COMMUNITY): Payer: Medicare Other

## 2021-03-21 DIAGNOSIS — N1832 Chronic kidney disease, stage 3b: Secondary | ICD-10-CM | POA: Diagnosis present

## 2021-03-21 DIAGNOSIS — G20 Parkinson's disease: Secondary | ICD-10-CM

## 2021-03-21 DIAGNOSIS — Z981 Arthrodesis status: Secondary | ICD-10-CM | POA: Diagnosis not present

## 2021-03-21 DIAGNOSIS — I5032 Chronic diastolic (congestive) heart failure: Secondary | ICD-10-CM

## 2021-03-21 DIAGNOSIS — I130 Hypertensive heart and chronic kidney disease with heart failure and stage 1 through stage 4 chronic kidney disease, or unspecified chronic kidney disease: Secondary | ICD-10-CM | POA: Diagnosis present

## 2021-03-21 DIAGNOSIS — N401 Enlarged prostate with lower urinary tract symptoms: Secondary | ICD-10-CM

## 2021-03-21 DIAGNOSIS — E1122 Type 2 diabetes mellitus with diabetic chronic kidney disease: Secondary | ICD-10-CM | POA: Diagnosis present

## 2021-03-21 DIAGNOSIS — N178 Other acute kidney failure: Secondary | ICD-10-CM

## 2021-03-21 DIAGNOSIS — Z66 Do not resuscitate: Secondary | ICD-10-CM

## 2021-03-21 DIAGNOSIS — Z20822 Contact with and (suspected) exposure to covid-19: Secondary | ICD-10-CM | POA: Diagnosis present

## 2021-03-21 DIAGNOSIS — N179 Acute kidney failure, unspecified: Secondary | ICD-10-CM | POA: Diagnosis present

## 2021-03-21 DIAGNOSIS — E118 Type 2 diabetes mellitus with unspecified complications: Secondary | ICD-10-CM

## 2021-03-21 DIAGNOSIS — A09 Infectious gastroenteritis and colitis, unspecified: Secondary | ICD-10-CM | POA: Diagnosis not present

## 2021-03-21 DIAGNOSIS — I10 Essential (primary) hypertension: Secondary | ICD-10-CM

## 2021-03-21 DIAGNOSIS — N138 Other obstructive and reflux uropathy: Secondary | ICD-10-CM

## 2021-03-21 DIAGNOSIS — R413 Other amnesia: Secondary | ICD-10-CM

## 2021-03-21 DIAGNOSIS — R197 Diarrhea, unspecified: Secondary | ICD-10-CM | POA: Diagnosis present

## 2021-03-21 DIAGNOSIS — A0472 Enterocolitis due to Clostridium difficile, not specified as recurrent: Secondary | ICD-10-CM | POA: Diagnosis present

## 2021-03-21 DIAGNOSIS — R338 Other retention of urine: Secondary | ICD-10-CM | POA: Diagnosis present

## 2021-03-21 LAB — RESP PANEL BY RT-PCR (FLU A&B, COVID) ARPGX2
Influenza A by PCR: NEGATIVE
Influenza B by PCR: NEGATIVE
SARS Coronavirus 2 by RT PCR: NEGATIVE

## 2021-03-21 MED ORDER — LACTATED RINGERS IV BOLUS
1000.0000 mL | Freq: Once | INTRAVENOUS | Status: AC
  Administered 2021-03-21: 10:00:00 1000 mL via INTRAVENOUS

## 2021-03-21 MED ORDER — AMANTADINE HCL 100 MG PO CAPS
100.0000 mg | ORAL_CAPSULE | Freq: Two times a day (BID) | ORAL | Status: DC
  Administered 2021-03-21 – 2021-03-23 (×4): 100 mg via ORAL
  Filled 2021-03-21 (×5): qty 1

## 2021-03-21 MED ORDER — CARBIDOPA-LEVODOPA 25-100 MG PO TABS
1.0000 | ORAL_TABLET | Freq: Two times a day (BID) | ORAL | Status: DC
  Administered 2021-03-22 – 2021-03-23 (×4): 1 via ORAL
  Filled 2021-03-21 (×4): qty 1

## 2021-03-21 MED ORDER — CARBIDOPA-LEVODOPA 25-250 MG PO TABS: 1.0000 | ORAL_TABLET | ORAL | Status: DC

## 2021-03-21 MED ORDER — OXYCODONE HCL 5 MG PO TABS: 5.0000 mg | ORAL_TABLET | Freq: Two times a day (BID) | ORAL | Status: DC | PRN

## 2021-03-21 MED ORDER — OXYCODONE-ACETAMINOPHEN 5-325 MG PO TABS
1.0000 | ORAL_TABLET | Freq: Two times a day (BID) | ORAL | Status: DC | PRN
  Administered 2021-03-22: 1 via ORAL
  Filled 2021-03-21: qty 1

## 2021-03-21 MED ORDER — FIDAXOMICIN 200 MG PO TABS
200.0000 mg | ORAL_TABLET | Freq: Two times a day (BID) | ORAL | Status: DC
  Administered 2021-03-22 – 2021-03-23 (×3): 200 mg via ORAL
  Filled 2021-03-21 (×5): qty 1

## 2021-03-21 MED ORDER — HYDRALAZINE HCL 20 MG/ML IJ SOLN: 5.0000 mg | INTRAMUSCULAR | Status: DC | PRN

## 2021-03-21 MED ORDER — ONDANSETRON HCL 4 MG/2ML IJ SOLN: 4.0000 mg | Freq: Four times a day (QID) | INTRAMUSCULAR | Status: DC | PRN

## 2021-03-21 MED ORDER — OXYCODONE-ACETAMINOPHEN 10-325 MG PO TABS: 1.0000 | ORAL_TABLET | Freq: Two times a day (BID) | ORAL | Status: DC | PRN

## 2021-03-21 MED ORDER — INSULIN ASPART 100 UNIT/ML IJ SOLN
0.0000 [IU] | Freq: Three times a day (TID) | INTRAMUSCULAR | Status: DC
  Administered 2021-03-22: 1 [IU] via SUBCUTANEOUS

## 2021-03-21 MED ORDER — ACETAMINOPHEN 325 MG PO TABS
650.0000 mg | ORAL_TABLET | Freq: Four times a day (QID) | ORAL | Status: DC | PRN
  Administered 2021-03-23: 650 mg via ORAL
  Filled 2021-03-21: qty 2

## 2021-03-21 MED ORDER — GABAPENTIN 300 MG PO CAPS
600.0000 mg | ORAL_CAPSULE | Freq: Every day | ORAL | Status: DC | PRN
  Administered 2021-03-23: 600 mg via ORAL
  Filled 2021-03-21: qty 2

## 2021-03-21 MED ORDER — DONEPEZIL HCL 10 MG PO TABS
10.0000 mg | ORAL_TABLET | Freq: Every morning | ORAL | Status: DC
  Administered 2021-03-22 – 2021-03-23 (×2): 10 mg via ORAL
  Filled 2021-03-21 (×2): qty 1

## 2021-03-21 MED ORDER — PANTOPRAZOLE SODIUM 40 MG PO TBEC
40.0000 mg | DELAYED_RELEASE_TABLET | Freq: Every day | ORAL | Status: DC
  Administered 2021-03-22 – 2021-03-23 (×2): 40 mg via ORAL
  Filled 2021-03-21 (×2): qty 1

## 2021-03-21 MED ORDER — ACETAMINOPHEN 650 MG RE SUPP: 650.0000 mg | Freq: Four times a day (QID) | RECTAL | Status: DC | PRN

## 2021-03-21 MED ORDER — ALLOPURINOL 300 MG PO TABS
300.0000 mg | ORAL_TABLET | Freq: Every morning | ORAL | Status: DC
  Administered 2021-03-22 – 2021-03-23 (×2): 300 mg via ORAL
  Filled 2021-03-21 (×2): qty 1

## 2021-03-21 MED ORDER — ONDANSETRON HCL 4 MG PO TABS: 4.0000 mg | ORAL_TABLET | Freq: Four times a day (QID) | ORAL | Status: DC | PRN

## 2021-03-21 MED ORDER — CYCLOBENZAPRINE HCL 5 MG PO TABS
5.0000 mg | ORAL_TABLET | Freq: Three times a day (TID) | ORAL | Status: DC | PRN
  Administered 2021-03-22: 5 mg via ORAL
  Filled 2021-03-21: qty 1

## 2021-03-21 MED ORDER — HYDROXYZINE HCL 25 MG PO TABS
50.0000 mg | ORAL_TABLET | Freq: Every evening | ORAL | Status: DC | PRN
  Administered 2021-03-23: 50 mg via ORAL
  Filled 2021-03-21: qty 2

## 2021-03-21 MED ORDER — CARBIDOPA-LEVODOPA ER 50-200 MG PO TBCR
2.0000 | EXTENDED_RELEASE_TABLET | ORAL | Status: DC
  Administered 2021-03-21 – 2021-03-22 (×3): 2 via ORAL
  Filled 2021-03-21 (×5): qty 2

## 2021-03-21 MED ORDER — LACTATED RINGERS IV SOLN: INTRAVENOUS | Status: DC

## 2021-03-21 MED ORDER — CARBIDOPA-LEVODOPA ER 50-200 MG PO TBCR
2.0000 | EXTENDED_RELEASE_TABLET | ORAL | Status: DC
Start: 2021-03-21 — End: 2021-03-21

## 2021-03-21 MED ORDER — AMLODIPINE BESYLATE 5 MG PO TABS
5.0000 mg | ORAL_TABLET | Freq: Every day | ORAL | Status: DC
  Administered 2021-03-21 – 2021-03-22 (×2): 5 mg via ORAL
  Filled 2021-03-21 (×3): qty 1

## 2021-03-21 MED ORDER — ENOXAPARIN SODIUM 30 MG/0.3ML IJ SOSY
30.0000 mg | PREFILLED_SYRINGE | INTRAMUSCULAR | Status: DC
  Administered 2021-03-21 – 2021-03-22 (×2): 30 mg via SUBCUTANEOUS
  Filled 2021-03-21 (×3): qty 0.3

## 2021-03-21 NOTE — Assessment & Plan Note (Signed)
-  Prostatomegaly on exam -Not on meds currently -May need foley placement but currently <300 cc urine on bladder scan

## 2021-03-21 NOTE — Assessment & Plan Note (Addendum)
-  Patient with prior h/o C diff colitis -He was admitted for this 1 month ago and treated for at least a few days with Dificid -He does not appear to have completed this therapy -He presents with recurrent diarrhea - which is likely C diff but probably not refractory since it does not appear to have been effectively treated -Will resume Dificid for 10 days -C diff and stool panel are pending -Plan was briefly discussed by telephone with ID  -Flexiseal placed

## 2021-03-21 NOTE — H&P (Signed)
History and Physical    Patient: Tyler Lester AVW:[id number] DOB: [birth year] DOA: 03/20/2021 DOS: the patient was seen and examined on 03/21/2021 PCP: [facility]  Patient coming from: Home - lives alone but has a 9-hour/day nurse, [name]; NOK: Caregiver, Fransico Him, [phone]   Chief Complaint: Diarrhea  HPI: Tyler Lester is a 76 y.o. male with medical history significant of Parkinson's; HTN; stage 3 CKD; DM; chronic diastolic CHF: and BPH with urinary retention presenting with persistent diarrhea.  He was seen in the ER on 12/19 and reported having been treated for UTI about a month prior.  He reported weakness and was found to have mild dehydration; he was given IVF and able to ambulate and so discharged.  He returned on 12/23 and was hospitalized from 12/23-12/27 for sepsis due to UTI and AKI.  The following morning he was noted to have diarrhea and was reported to have C diff colitis on admission (no report of such in H&P).  He was tested for C. Diff on 12/24 and was PCR positive and then Ag positive -with negative toxin  so with indeterminate results.   Urine culture was negative and Vanc/Cefepime was stopped and he was treated with Dificid.  The following morning he was reported to have ongoing diarrhea, "10 times last 24hrs per documentation."  There is no further documentation regarding diarrhea, although Dificid does appear to have been on his discharge medication list.  He took a gray and pink capsule for maybe a couple of days but he did not finish it - his caregiver is unsure if this was an antibiotic or other; med search appears to indicate that this might have been Clindamycin, unlikely to have been Dificid which is a white oblong tablet.  The patient reports having had diarrhea for about the last 10 days and denies having it immediately after having left the hospital.  He reports innumerable stools per day.  Denies abdominal pain.  Denies n/v.  His mouth feels very  dry.      ER Course:  10 days of diarrhea and AKI.  C diff pending.  Creatinine 3.13, baseline 1.8.  Renal imaging negative.       Review of Systems: As mentioned in the history of present illness. All other systems reviewed and are negative. Past Medical History:  Diagnosis Date   Bilateral foot pain    Chronic pain    Diabetes mellitus    type 2, diet controlled now   Foley catheter in place since nov 2018, last changed 1 week ago   Hyperlipidemia    Hypertension    Inguinal hernia    right   Numbness    T/O   Parkinson disease ([place]) 08/26/2015   Followed by [facility]. Last visit 06/03/2015. History of poor compliance with his medication. Referred him back to [place] in 10/2016. They didn't want to see him back due to poor compliance with medical advise. Last plan from 06/03/2015 still valid   Parkinson's disease ([place])    Pneumonia yrs ago   Shortness of breath    with exertion   Urinary retention    Past Surgical History:  Procedure Laterality Date   CATARACT EXTRACTION W/ INTRAOCULAR LENS IMPLANT Left 06/2012   laser for posterior capsule?   COLON SURGERY     POSTERIOR CERVICAL FUSION/FORAMINOTOMY N/A 11/12/2013   Procedure: Posterior cervical decompression fusion, cervical 3-4, cervical 4-5, cervical 5-6, cervical 6-7 with instrumentation and allograft   (LEVEL 4);  Surgeon:  Sinclair Ship, MD;  Location: [facility];  Service: Orthopedics;  Laterality: N/A;  Posterior cervical decompression fusion, cervical 3-4, cervical 4-5, cervical 5-6, cervical 6-7 with instrumentation and allograft   TONSILLECTOMY     XI ROBOTIC ASSISTED SIMPLE PROSTATECTOMY N/A 03/07/2016   Procedure: XI ROBOTIC ASSISTED SIMPLE PROSTATECTOMY AND UMBILICAL HERNIA REPAIR flexible cystoscopy;  Surgeon: Alexis Frock, MD;  Location: [facility];  Service: Urology;  Laterality: N/A;   Social History:  reports that he has never smoked. He has never used smokeless tobacco. He reports current alcohol use. He reports that he  does not use drugs.  Allergies  Allergen Reactions   Poison Ivy Treatments Hives    Family History  Problem Relation Age of Onset   Cancer - Other Other    Diabetes Other    Hypertension Other     Prior to Admission medications   Medication Sig Start Date End Date Taking? Authorizing Provider  allopurinol (ZYLOPRIM) 300 MG tablet Take 300 mg by mouth every morning. 02/21/20  Yes [provider]  amantadine (SYMMETREL) 100 MG capsule Take 100 mg by mouth 2 (two) times daily. 09/29/20  Yes [provider]  amLODipine (NORVASC) 5 MG tablet Take 1 tablet (5 mg total) by mouth daily. 03/04/20 03/21/21 Yes Hall, Carole N, DO  carbidopa-levodopa (SINEMET CR) 50-200 MG tablet Take 2 tablets by mouth See admin instructions. 2 tablets at 5pm and bedtime (9-10pm) 11/29/20  Yes [provider]  carbidopa-levodopa (SINEMET IR) 25-250 MG tablet Take 1 tablet by mouth See admin instructions. Take one tablet by mouth at 9am and 1pm   Yes [provider]  cyclobenzaprine (FLEXERIL) 5 MG tablet Take 5 mg by mouth 3 (three) times daily as needed for muscle spasms. 02/05/20  Yes [provider]  diclofenac Sodium (VOLTAREN) 1 % GEL Apply 2-4 g topically daily as needed (leg and hip pain). 10/04/20  Yes [provider]  donepezil (ARICEPT) 10 MG tablet Take 1 tablet by mouth every day Patient taking differently: Take 10 mg by mouth every morning. 12/15/19  Yes Wilber Oliphant, MD  gabapentin (NEURONTIN) 600 MG tablet Take 600 mg by mouth daily as needed (nerve pain). 10/04/20  Yes [provider]  hydrOXYzine (ATARAX) 50 MG tablet Take 50 mg by mouth at bedtime as needed (sleep). 02/04/21  Yes [provider]  melatonin 5 MG TABS Take 5 mg by mouth at bedtime as needed (sleep).   Yes [provider]  naloxone (NARCAN) nasal spray 4 mg/0.1 mL Place 4 mg into the nose once as needed (opioid overdose). 10/04/20  Yes [provider]   omeprazole (PRILOSEC) 40 MG capsule Take 80 mg by mouth every morning. 09/10/20  Yes [provider]  oxyCODONE-acetaminophen (PERCOCET) 10-325 MG tablet Take 1 tablet by mouth 2 (two) times daily as needed for pain. 02/04/21  Yes [provider]  Vitamin D, Ergocalciferol, (DRISDOL) 1.25 MG (50000 UNIT) CAPS capsule Take 50,000 Units by mouth every Sunday. 02/04/20  Yes [provider]  benzonatate (TESSALON) 100 MG capsule Take 1 capsule (100 mg total) by mouth every 8 (eight) hours. Patient not taking: Reported on 03/21/2021 10/27/20   Raspet, Junie Panning K, PA-C  finasteride (PROSCAR) 5 MG tablet Take 1/2 tablet (2.5 mg total) by mouth daily. Patient not taking: Reported on 03/21/2021 02/22/21   Emeterio Reeve, DO  tamsulosin (FLOMAX) 0.4 MG CAPS capsule Take 1 capsule (0.4 mg total) by mouth daily after supper. Patient not taking:  Reported on 03/21/2021 02/21/21   Emeterio Reeve, DO    Physical Exam: Vitals:   03/21/21 1600 03/21/21 1630 03/21/21 1700 03/21/21 1730  BP: 130/79 (!) 158/85 (!) 148/79 (!) 155/89  Pulse: 85 84 85 83  Resp: 16 17 16 12   Temp:      TempSrc:      SpO2: 100% 100% 100% 100%   General:  Appears calm and comfortable and is in NAD Eyes:  PERRL, EOMI, normal lids, iris ENT:  grossly normal hearing, lips & tongue, dry mm; absent dentition Neck:  no LAD, masses or thyromegaly Cardiovascular:  RRR, no m/r/g. No LE edema.  Respiratory:   CTA bilaterally with no wheezes/rales/rhonchi.  Normal respiratory effort. Abdomen:  soft, NT, ND, NABS Skin:  no rash or induration seen on limited exam Musculoskeletal:  increased tone BUE/BLE, no bony abnormality Psychiatric:  grossly normal mood and affect, speech fluent and appropriate, AOx3, ?mild cognitive impairment but able to answer questions appropriately Neurologic:  resting tremor noted   Radiological Exams on Admission: Independently reviewed - see discussion in A/P where applicable  CT  ABDOMEN PELVIS WO CONTRAST  Result Date: 03/21/2021 CLINICAL DATA:  76 year old male with history of nausea, vomiting and abdominal pain. EXAM: CT ABDOMEN AND PELVIS WITHOUT CONTRAST TECHNIQUE: Multidetector CT imaging of the abdomen and pelvis was performed following the standard protocol without IV contrast. RADIATION DOSE REDUCTION: This exam was performed according to the departmental dose-optimization program which includes automated exposure control, adjustment of the mA and/or kV according to patient size and/or use of iterative reconstruction technique. COMPARISON:  CT the abdomen and pelvis 03/25/2014. FINDINGS: Lower chest: Atherosclerotic calcifications in the thoracic aorta as well as the left main, left anterior descending, left circumflex and right coronary arteries. Hepatobiliary: No definite suspicious cystic or solid hepatic lesions are confidently identified on today's noncontrast CT examination. Unenhanced appearance of the gallbladder is unremarkable. Pancreas: No definite pancreatic mass or peripancreatic fluid collections or inflammatory changes are noted on today's noncontrast CT examination. Spleen: Unremarkable. Adrenals/Urinary Tract: No calcifications are identified within the collecting system of either kidney, along the course of either ureter, or within the lumen of the urinary bladder. There is no hydroureteronephrosis. In the upper pole of the right kidney (axial image 37 of series 3) there is a 2.1 cm low-attenuation lesion which is incompletely characterized on today's noncontrast CT examination, but statistically likely to represent a cyst. Unenhanced appearance of the left kidney and bilateral adrenal glands is otherwise normal. Urinary bladder is normal in appearance. Stomach/Bowel: Unenhanced appearance of the stomach is normal. There is no pathologic dilatation of small bowel or colon. The appendix is not confidently identified and may be surgically absent. Regardless, there  are no inflammatory changes noted adjacent to the cecum to suggest the presence of an acute appendicitis at this time. Vascular/Lymphatic: Atherosclerotic calcifications in the abdominal aorta. No lymphadenopathy noted in the abdomen or pelvis. Reproductive: Prostate gland is enlarged measuring 5.7 x 5.7 cm. Seminal vesicles are unremarkable in appearance. Other: No significant volume of ascites.  No pneumoperitoneum. Musculoskeletal: There are no aggressive appearing lytic or blastic lesions noted in the visualized portions of the skeleton. IMPRESSION: 1. No acute findings are noted in the abdomen or pelvis to account for the patient's symptoms. 2. Aortic atherosclerosis, in addition to left main and three-vessel coronary artery disease. Assessment for potential risk factor modification, dietary therapy or pharmacologic therapy may be warranted, if clinically indicated. 3. Prostatomegaly. 4. Additional incidental findings, as  above. Electronically Signed   By: Vinnie Langton M.D.   On: 03/21/2021 11:06    EKG: not done   Labs on Admission: I have personally reviewed the available labs and imaging studies at the time of the admission.  Pertinent labs:    BUN 45/Creatinine 3.13/GFR 20; 20/1.76/40 on 12/27 Albumin 3.3 Normal CBC COVID/flu negative   Assessment/Plan DNR (do not resuscitate)- (present on admission) -I have discussed code status with the patient and he would not desire resuscitation and would prefer to die a natural death should that situation arise. -He was able to provide history and appeared capable of making this decision at the time of admission  Diarrhea- (present on admission) -Patient with prior h/o C diff colitis -He was admitted for this 1 month ago and treated for at least a few days with Dificid -He does not appear to have completed this therapy -He presents with recurrent diarrhea - which is likely C diff but probably not refractory since it does not appear to have  been effectively treated -Will resume Dificid for 10 days -C diff and stool panel are pending -Plan was briefly discussed by telephone with ID  -Flexiseal placed  Memory change- (present on admission) -Continue Aricept  BPH with Refracotry Urinary Retention- (present on admission) -Prostatomegaly on exam -Not on meds currently -May need foley placement but currently <300 cc urine on bladder scan  Type 2 diabetes mellitus with complication ([place])- (present on admission) -Recent A1c was 6.1 -Previously on Glucophage but he does not appear to be on medications at this time -Cover with sensitive-scale SSI   Parkinson's disease ([place])- (present on admission) -Previously recommended for SNF but patient declined -He does have a full-time daytime caregiver who reports an ability to continue to care for him at home -Continue Sinemet, Amantadine, Neurontin  Acute renal failure superimposed on stage 3 chronic kidney disease ([place])- (present on admission) -Baseline stage 3b CKD -Now with renal failure -Very likely related to prerenal azotemia in the setting of severe diarrhea -Will rehydrate and follow -He does have prostatomegaly on imaging but otherwise no apparent obstruction -If not improving, consider nephrology consult  Chronic diastolic heart failure ([place])- (present on admission) -Volume depleted at this time -Will monitor with ongoing hydration  Essential hypertension- (present on admission) -Continue Norvasc -Will add prn IV hydralazine   Advance Care Planning:   Code Status: DNR   Consults: ID by telephone only  Family Communication: None present; I spoke with his caregiver at the time of admission  Severity of Illness: The appropriate patient status for this patient is INPATIENT. Inpatient status is judged to be reasonable and necessary in order to provide the required intensity of service to ensure the patient's safety. The patient's presenting symptoms, physical exam  findings, and initial radiographic and laboratory data in the context of their chronic comorbidities is felt to place them at high risk for further clinical deterioration. Furthermore, it is not anticipated that the patient will be medically stable for discharge from the hospital within 2 midnights of admission.   * I certify that at the point of admission it is my clinical judgment that the patient will require inpatient hospital care spanning beyond 2 midnights from the point of admission due to high intensity of service, high risk for further deterioration and high frequency of surveillance required.*  Author: Karmen Bongo, MD 03/21/2021 6:56 PM  For on call review [email or web address].

## 2021-03-21 NOTE — Assessment & Plan Note (Signed)
-  I have discussed code status with the patient and he would not desire resuscitation and would prefer to die a natural death should that situation arise. -He was able to provide history and appeared capable of making this decision at the time of admission

## 2021-03-21 NOTE — Assessment & Plan Note (Signed)
-  Previously recommended for SNF but patient declined -He does have a full-time daytime caregiver who reports an ability to continue to care for him at home -Continue Sinemet, Amantadine, Neurontin

## 2021-03-21 NOTE — ED Provider Notes (Signed)
[facility] EMERGENCY DEPARTMENT Provider Note   CSN: [id number] Arrival date & time: 03/20/21  1607     History  Chief Complaint  Patient presents with   Diarrhea    Tyler Lester is a 76 y.o. male.   Diarrhea    76 year old male with medical history significant for Parkinson's disease, chronic pain, DM2, HLD, HTN, urinary retention who presents emergency department with concern for diarrheal illness and dehydration.  Patient was seen in urgent care yesterday.  He had a recent hospitalization for sepsis 1 month ago and been treated with IV antibiotics to include vancomycin and Flagyl.  He states that for the past week and a half he has had watery diarrhea.  He states that anything he eats or drinks goes right through him.  He denies any blood or mucus in his stool.  He denies any nausea or vomiting.  He denies any fevers.  Endorses some generalized abdominal discomfort.  He denies any pain with urination or increased urinary frequency.  Home Medications Prior to Admission medications   Medication Sig Start Date End Date Taking? Authorizing Provider  allopurinol (ZYLOPRIM) 300 MG tablet Take 300 mg by mouth every morning. 02/21/20   [provider]  amantadine (SYMMETREL) 100 MG capsule Take 100 mg by mouth 2 (two) times daily. 09/29/20   [provider]  amLODipine (NORVASC) 5 MG tablet Take 1 tablet (5 mg total) by mouth daily. Patient not taking: Reported on 02/18/2021 03/04/20 10/13/20  Kayleen Memos, DO  benzonatate (TESSALON) 100 MG capsule Take 1 capsule (100 mg total) by mouth every 8 (eight) hours. Patient taking differently: Take 100 mg by mouth every 8 (eight) hours as needed for cough. 10/27/20   Raspet, Junie Panning K, PA-C  carbidopa-levodopa (SINEMET CR) 50-200 MG tablet Take 1-2 tablets by mouth See admin instructions. Take one tablet by mouth at 9am and 1pm, take 2 tablets at 5pm and bedtime (9-10pm) 11/29/20   [provider]   carbidopa-levodopa (SINEMET IR) 25-250 MG tablet Take 1 tablet by mouth See admin instructions. Take one tablet by mouth at 9am and 1pm    [provider]  cyclobenzaprine (FLEXERIL) 5 MG tablet Take 5 mg by mouth 3 (three) times daily as needed for muscle spasms. 02/05/20   [provider]  diclofenac Sodium (VOLTAREN) 1 % GEL Apply 2-4 g topically daily as needed (leg and hip pain). 10/04/20   [provider]  donepezil (ARICEPT) 10 MG tablet Take 1 tablet by mouth every day Patient taking differently: Take 10 mg by mouth every morning. 12/15/19   Wilber Oliphant, MD  finasteride (PROSCAR) 5 MG tablet Take 1/2 tablet (2.5 mg total) by mouth daily. 02/22/21   Emeterio Reeve, DO  gabapentin (NEURONTIN) 600 MG tablet Take 600 mg by mouth every morning. 10/04/20   [provider]  hydrOXYzine (ATARAX) 50 MG tablet Take 50 mg by mouth at bedtime as needed (sleep). 02/04/21   [provider]  melatonin 5 MG TABS Take 5 mg by mouth at bedtime as needed (sleep).    [provider]  naloxone [facility]) nasal spray 4 mg/0.1 mL Place 4 mg into the nose once as needed (opioid overdose). 10/04/20   [provider]  omeprazole (PRILOSEC) 40 MG capsule Take 80 mg by mouth every morning. 09/10/20   [provider]  ondansetron (ZOFRAN ODT) 4 MG disintegrating tablet Take 1 tablet (4 mg total) by mouth every 8 (eight) hours as  needed for nausea or vomiting. Patient not taking: Reported on 02/18/2021 04/18/20   Sharion Balloon, NP  oxyCODONE-acetaminophen (PERCOCET) 10-325 MG tablet Take 1 tablet by mouth 2 (two) times daily as needed for pain. 02/04/21   [provider]  tamsulosin (FLOMAX) 0.4 MG CAPS capsule Take 1 capsule (0.4 mg total) by mouth daily after supper. 02/21/21   Emeterio Reeve, DO  Vitamin D, Ergocalciferol, (DRISDOL) 1.25 MG (50000 UNIT) CAPS capsule Take 50,000 Units by mouth every Sunday. 02/04/20   [provider]      Allergies    Poison ivy treatments    Review of Systems   Review of Systems  Gastrointestinal:  Positive for diarrhea.  All other systems reviewed and are negative.  Physical Exam Updated Vital Signs BP (!) 156/83    Pulse 91    Temp 98.2 F (36.8 C) (Oral)    Resp 19    SpO2 95%  Physical Exam Vitals and nursing note reviewed.  Constitutional:      General: He is not in acute distress.    Appearance: He is well-developed.  HENT:     Head: Normocephalic and atraumatic.     Mouth/Throat:     Mouth: Mucous membranes are dry.  Eyes:     Conjunctiva/sclera: Conjunctivae normal.     Pupils: Pupils are equal, round, and reactive to light.  Cardiovascular:     Rate and Rhythm: Normal rate and regular rhythm.     Heart sounds: No murmur heard. Pulmonary:     Effort: Pulmonary effort is normal. No respiratory distress.     Breath sounds: Normal breath sounds.  Abdominal:     General: There is no distension.     Palpations: Abdomen is soft.     Tenderness: There is no abdominal tenderness. There is no guarding.  Musculoskeletal:        General: No swelling, deformity or signs of injury.     Cervical back: Neck supple.  Skin:    General: Skin is warm and dry.     Capillary Refill: Capillary refill takes less than 2 seconds.     Findings: No lesion or rash.  Neurological:     General: No focal deficit present.     Mental Status: He is alert. Mental status is at baseline.  Psychiatric:        Mood and Affect: Mood normal.    ED Results / Procedures / Treatments   Labs (all labs ordered are listed, but only abnormal results are displayed) Labs Reviewed  LIPASE, BLOOD - Abnormal; Notable for the following components:      Result Value   Lipase 60 (*)    All other components within normal limits  COMPREHENSIVE METABOLIC PANEL - Abnormal; Notable for the following components:   Glucose, Bld 105 (*)    BUN 45 (*)    Creatinine, Ser 3.13 (*)    Albumin 3.3 (*)     AST 11 (*)    GFR, Estimated 20 (*)    All other components within normal limits  C DIFFICILE QUICK SCREEN W PCR REFLEX    RESP PANEL BY RT-PCR (FLU A&B, COVID) [id number]  GASTROINTESTINAL PANEL BY PCR, STOOL (REPLACES STOOL CULTURE)  CBC  URINALYSIS, ROUTINE W REFLEX MICROSCOPIC    EKG None  Radiology CT ABDOMEN PELVIS WO CONTRAST  Result Date: 03/21/2021 CLINICAL DATA:  76 year old male with history of nausea, vomiting and abdominal pain. EXAM: CT ABDOMEN AND PELVIS WITHOUT CONTRAST TECHNIQUE:  Multidetector CT imaging of the abdomen and pelvis was performed following the standard protocol without IV contrast. RADIATION DOSE REDUCTION: This exam was performed according to the departmental dose-optimization program which includes automated exposure control, adjustment of the mA and/or kV according to patient size and/or use of iterative reconstruction technique. COMPARISON:  CT the abdomen and pelvis 03/25/2014. FINDINGS: Lower chest: Atherosclerotic calcifications in the thoracic aorta as well as the left main, left anterior descending, left circumflex and right coronary arteries. Hepatobiliary: No definite suspicious cystic or solid hepatic lesions are confidently identified on today's noncontrast CT examination. Unenhanced appearance of the gallbladder is unremarkable. Pancreas: No definite pancreatic mass or peripancreatic fluid collections or inflammatory changes are noted on today's noncontrast CT examination. Spleen: Unremarkable. Adrenals/Urinary Tract: No calcifications are identified within the collecting system of either kidney, along the course of either ureter, or within the lumen of the urinary bladder. There is no hydroureteronephrosis. In the upper pole of the right kidney (axial image 37 of series 3) there is a 2.1 cm low-attenuation lesion which is incompletely characterized on today's noncontrast CT examination, but statistically likely to represent a cyst. Unenhanced appearance of  the left kidney and bilateral adrenal glands is otherwise normal. Urinary bladder is normal in appearance. Stomach/Bowel: Unenhanced appearance of the stomach is normal. There is no pathologic dilatation of small bowel or colon. The appendix is not confidently identified and may be surgically absent. Regardless, there are no inflammatory changes noted adjacent to the cecum to suggest the presence of an acute appendicitis at this time. Vascular/Lymphatic: Atherosclerotic calcifications in the abdominal aorta. No lymphadenopathy noted in the abdomen or pelvis. Reproductive: Prostate gland is enlarged measuring 5.7 x 5.7 cm. Seminal vesicles are unremarkable in appearance. Other: No significant volume of ascites.  No pneumoperitoneum. Musculoskeletal: There are no aggressive appearing lytic or blastic lesions noted in the visualized portions of the skeleton. IMPRESSION: 1. No acute findings are noted in the abdomen or pelvis to account for the patient's symptoms. 2. Aortic atherosclerosis, in addition to left main and three-vessel coronary artery disease. Assessment for potential risk factor modification, dietary therapy or pharmacologic therapy may be warranted, if clinically indicated. 3. Prostatomegaly. 4. Additional incidental findings, as above. Electronically Signed   By: Vinnie Langton M.D.   On: 03/21/2021 11:06    Procedures Procedures    Medications Ordered in ED Medications  lactated ringers bolus 1,000 mL (1,000 mLs Intravenous New Bag/Given 03/21/21 4098)    ED Course/ Medical Decision Making/ A&P Clinical Course as of 03/21/21 1220  Tue Mar 21, 2021  0948 Lipase(!): 60 [JL]  0948 Creatinine(!): 3.13 [JL]  0948 BUN(!): 45 [JL]    Clinical Course User Index [JL] Regan Lemming, MD                           Medical Decision Making Amount and/or Complexity of Data Reviewed Labs: ordered. Decision-making details documented in ED Course. Radiology: ordered.  Risk Decision regarding  hospitalization.   76 year old male with medical history significant for Parkinson's disease, chronic pain, DM2, HLD, HTN, urinary retention who presents emergency department with concern for diarrheal illness and dehydration.  Patient was seen in urgent care yesterday.  He had a recent hospitalization for sepsis 1 month ago and been treated with IV antibiotics to include vancomycin and Flagyl.  He states that for the past week and a half he has had watery diarrhea.  He states that anything he eats  or drinks goes right through him.  He denies any blood or mucus in his stool.  He denies any nausea or vomiting.  He denies any fevers.  Endorses some generalized abdominal discomfort.  He denies any pain with urination or increased urinary frequency.  On arrival, the patient was afebrile, tachycardic P106, hemodynamically stable, mildly hypertensive BP 136/118, saturating 100% on room air.  His physical exam was generally unremarkable with the exception of mildly dry mucous membranes.  He presents with roughly 10 days of diarrheal illness and presents from urgent care due to concern for possible C. difficile infection given his recent antibiotic usage in the past month and 10 days of profuse watery diarrhea.  He has minimal abdominal discomfort on exam.  Low concern for toxic megacolon at this time.  Low concern for sepsis and the patient is not meeting SIRS criteria.  He has no evidence of nausea or vomiting.  He is tolerating oral intake but presents with concern for dehydration.  Screening laboratory work-up was initiated and reviewed by myself and was significant for a mildly elevated lipase of 60, CBC without a leukocytosis or anemia, CMP with concern for an AKI with a creatinine of 3.13 and a BUN of 45 from a baseline of around 1.8.  The patient was administered an IV fluid bolus for volume resuscitation.  Given his minimal abdominal tenderness, CT abdomen pelvis was ordered to evaluate for potential acute  abnormality.  CT without contrast was performed and was negative for any acute intra-abdominal abnormalities.  Given the patient's AKI in the setting of a diarrheal illness, concern for possible C. difficile or other intra-abdominal infection.  C. difficile and GI pathogen panel ordered.  Given the patient's AKI and dehydration, internal medicine consulted for admission for rehydration.   Final Clinical Impression(s) / ED Diagnoses Final diagnoses:  Diarrhea, unspecified type  AKI (acute kidney injury) [facility])    Rx / DC Orders ED Discharge Orders     None         Regan Lemming, MD 03/21/21 1220

## 2021-03-21 NOTE — Assessment & Plan Note (Signed)
-  Recent A1c was 6.1 -Previously on Glucophage but he does not appear to be on medications at this time -Cover with sensitive-scale SSI

## 2021-03-21 NOTE — Assessment & Plan Note (Addendum)
-  Baseline stage 3b CKD -Now with renal failure -Very likely related to prerenal azotemia in the setting of severe diarrhea -Will rehydrate and follow -He does have prostatomegaly on imaging but otherwise no apparent obstruction -If not improving, consider nephrology consult

## 2021-03-21 NOTE — Assessment & Plan Note (Signed)
Continue Aricept 

## 2021-03-21 NOTE — Assessment & Plan Note (Signed)
-  Volume depleted at this time -Will monitor with ongoing hydration

## 2021-03-21 NOTE — Assessment & Plan Note (Signed)
-  Continue Norvasc -Will add prn IV hydralazine

## 2021-03-21 NOTE — ED Notes (Signed)
Pt was incontinent of bowel and bladder. I changed pt and applied a clean brief. I was unable to obtain a stool sample, because the BM was water and soaked into the brief.

## 2021-03-21 NOTE — ED Notes (Signed)
Patient transported to CT 

## 2021-03-22 ENCOUNTER — Other Ambulatory Visit (HOSPITAL_COMMUNITY): Payer: Self-pay

## 2021-03-22 ENCOUNTER — Encounter (HOSPITAL_COMMUNITY): Payer: Self-pay | Admitting: Internal Medicine

## 2021-03-22 ENCOUNTER — Other Ambulatory Visit: Payer: Self-pay

## 2021-03-22 DIAGNOSIS — R197 Diarrhea, unspecified: Secondary | ICD-10-CM

## 2021-03-22 DIAGNOSIS — A0472 Enterocolitis due to Clostridium difficile, not specified as recurrent: Principal | ICD-10-CM

## 2021-03-22 LAB — CBC
HCT: 48 % (ref 39.0–52.0)
Hemoglobin: 15.6 g/dL (ref 13.0–17.0)
MCH: 28.5 pg (ref 26.0–34.0)
MCHC: 32.5 g/dL (ref 30.0–36.0)
MCV: 87.8 fL (ref 80.0–100.0)
Platelets: 210 10*3/uL (ref 150–400)
RBC: 5.47 MIL/uL (ref 4.22–5.81)
RDW: 13.5 % (ref 11.5–15.5)
WBC: 10.1 10*3/uL (ref 4.0–10.5)
nRBC: 0 % (ref 0.0–0.2)

## 2021-03-22 LAB — BASIC METABOLIC PANEL
Anion gap: 10 (ref 5–15)
BUN: 37 mg/dL — ABNORMAL HIGH (ref 8–23)
CO2: 22 mmol/L (ref 22–32)
Calcium: 9 mg/dL (ref 8.9–10.3)
Chloride: 105 mmol/L (ref 98–111)
Creatinine, Ser: 2.13 mg/dL — ABNORMAL HIGH (ref 0.61–1.24)
GFR, Estimated: 32 mL/min — ABNORMAL LOW (ref >60)
Glucose, Bld: 62 mg/dL — ABNORMAL LOW (ref 70–99)
Potassium: 3.3 mmol/L — ABNORMAL LOW (ref 3.5–5.1)
Sodium: 137 mmol/L (ref 135–145)

## 2021-03-22 LAB — CLOSTRIDIUM DIFFICILE BY PCR, REFLEXED: Toxigenic C. Difficile by PCR: POSITIVE — AB

## 2021-03-22 LAB — GASTROINTESTINAL PANEL BY PCR, STOOL (REPLACES STOOL CULTURE)

## 2021-03-22 LAB — C DIFFICILE QUICK SCREEN W PCR REFLEX
C Diff antigen: POSITIVE — AB
C Diff toxin: NEGATIVE

## 2021-03-22 LAB — GLUCOSE, CAPILLARY
Glucose-Capillary: 71 mg/dL (ref 70–99)
Glucose-Capillary: 114 mg/dL — ABNORMAL HIGH (ref 70–99)
Glucose-Capillary: 142 mg/dL — ABNORMAL HIGH (ref 70–99)
Glucose-Capillary: 67 mg/dL — ABNORMAL LOW (ref 70–99)
Glucose-Capillary: 76 mg/dL (ref 70–99)
Glucose-Capillary: 82 mg/dL (ref 70–99)
Glucose-Capillary: 79 mg/dL (ref 70–99)

## 2021-03-22 NOTE — Progress Notes (Signed)
PROGRESS NOTE    Tyler Lester  HER:[id number] DOB: 02-17-1946 DOA: 03/20/2021 PCP: [facility]   Brief Narrative:  Tyler Lester is a 76 y.o. male with medical history significant of Parkinson's; HTN; stage 3 CKDb; DM; chronic diastolic CHF: and BPH with urinary retention presenting with persistent diarrhea.  Previously evaluated for UTI on antibiotics with subsequently noted worsening diarrhea testing positive for C. difficile colitis in the outpatient setting -transition from vancomycin to Dificid with little to no improvement and presented back to the ED with greater than 10 bowel movements per 24 hours concerning for marked volume loss and uncontrolled C. difficile infection.   Assessment & Plan:   Acute intractable diarrhea- (present on admission) -Improving -Continue Dificid -Rectal tube to be removed, little to no output overnight other than gas, patient indicates he is having soft stool but not overt watery stool over the past 24 hours -C diff again toxin negative antigen positive, reflex positive however   BPH with Refracotry Urinary Retention- (present on admission) -Not currently on medications, defer to outpatient PCP   Type 2 diabetes mellitus with complication ([place])- (present on admission) -Well-controlled with recent A1c 6.1 -Continue sliding scale insulin, hypoglycemic protocol    Parkinson's disease ([place])- (present on admission) -Previously recommended for SNF but patient declined -He does have a full-time daytime caregiver who reports an ability to continue to care for him at home -Continue Sinemet, Amantadine, Neurontin, Aricept   Acute renal failure superimposed on stage 3b chronic kidney disease ([place])- (present on admission) - Likely related to prerenal azotemia in the setting of severe diarrhea -Resolving with fluids and p.o. intake   Chronic diastolic heart failure ([place])- (present on admission) -Not in acute exacerbation, volume down if  anything -Will monitor with ongoing hydration   Essential hypertension- (present on admission) -Continue Norvasc -Will add prn IV hydralazine   DVT prophylaxis: Lovenox Code Status: DNR Family Communication: None present  Status is: Inpatient  Dispo: The patient is from: Home              Anticipated d/c is to: To be determined              Anticipated d/c date is: 24 to 48 hours              Patient currently not medically stable for discharge  Consultants:  None  Procedures:  None  Antimicrobials:  Dificid  Subjective: No acute issues or events overnight, having difficulty having bowel movement with Flexi-Seal in, moderate gas noted, will remove given likely having more formed stool at this point.  Otherwise denies nausea vomiting headache fevers chills or chest pain.  Objective: Vitals:   03/22/21 0000 03/22/21 0100 03/22/21 0216 03/22/21 0635  BP: (!) 151/78 99/69 111/61 99/61  Pulse: 95  98 94  Resp: 13 (!) 22 15 18   Temp: 98.5 F (36.9 C)  98.2 F (36.8 C) 97.8 F (36.6 C)  TempSrc: Oral   Oral  SpO2: 100% 97% 100% 98%    Intake/Output Summary (Last 24 hours) at 03/22/2021 0751 Last data filed at 03/22/2021 8563 Gross per 24 hour  Intake 1795.77 ml  Output 900 ml  Net 895.77 ml   There were no vitals filed for this visit.  Examination:  General:  Pleasantly resting in bed, No acute distress. HEENT:  Normocephalic atraumatic.  Sclerae nonicteric, noninjected.  Extraocular movements intact bilaterally. Neck:  Without mass or deformity.  Trachea is midline. Lungs:  Clear to auscultate  bilaterally without rhonchi, wheeze, or rales. Heart:  Regular rate and rhythm.  Without murmurs, rubs, or gallops. Abdomen:  Soft, nontender, nondistended.  Without guarding or rebound.  Rectal tube intact with scant fecal output, moderate gas Vascular:  Dorsalis pedis and posterior tibial pulses palpable bilaterally.    Data Reviewed: I have personally reviewed  following labs and imaging studies  CBC: Recent Labs  Lab 03/20/21 1807 03/22/21 0240  WBC 8.4 10.1  HGB 15.2 15.6  HCT 49.7 48.0  MCV 90.0 87.8  PLT 229 664   Basic Metabolic Panel: Recent Labs  Lab 03/20/21 1807 03/22/21 0240  NA 141 137  K 3.6 3.3*  CL 106 105  CO2 24 22  GLUCOSE 105* 62*  BUN 45* 37*  CREATININE 3.13* 2.13*  CALCIUM 9.1 9.0   GFR: CrCl cannot be calculated (Unknown ideal weight.). Liver Function Tests: Recent Labs  Lab 03/20/21 1807  AST 11*  ALT 6  ALKPHOS 55  BILITOT 0.5  PROT 7.8  ALBUMIN 3.3*   Recent Labs  Lab 03/20/21 1807  LIPASE 60*   No results for input(s): AMMONIA in the last 168 hours. Coagulation Profile: No results for input(s): INR, PROTIME in the last 168 hours. Cardiac Enzymes: No results for input(s): CKTOTAL, CKMB, CKMBINDEX, TROPONINI in the last 168 hours. BNP (last 3 results) No results for input(s): PROBNP in the last 8760 hours. HbA1C: No results for input(s): HGBA1C in the last 72 hours. CBG: Recent Labs  Lab 03/22/21 0219 03/22/21 0636 03/22/21 0656  GLUCAP 71 114* 142*   Lipid Profile: No results for input(s): CHOL, HDL, LDLCALC, TRIG, CHOLHDL, LDLDIRECT in the last 72 hours. Thyroid Function Tests: No results for input(s): TSH, T4TOTAL, FREET4, T3FREE, THYROIDAB in the last 72 hours. Anemia Panel: No results for input(s): VITAMINB12, FOLATE, FERRITIN, TIBC, IRON, RETICCTPCT in the last 72 hours. Sepsis Labs: No results for input(s): PROCALCITON, LATICACIDVEN in the last 168 hours.  Recent Results (from the past 240 hour(s))  Resp Panel by RT-PCR (Flu A&B, Covid) Nasopharyngeal Swab     Status: None   Collection Time: 03/21/21  1:10 PM   Specimen: Nasopharyngeal Swab; Nasopharyngeal(NP) swabs in vial transport medium  Result Value Ref Range Status   SARS Coronavirus 2 by RT PCR NEGATIVE NEGATIVE Final    Comment: (NOTE) SARS-CoV-2 target nucleic acids are NOT DETECTED.  The SARS-CoV-2 RNA  is generally detectable in upper respiratory specimens during the acute phase of infection. The lowest concentration of SARS-CoV-2 viral copies this assay can detect is 138 copies/mL. A negative result does not preclude SARS-Cov-2 infection and should not be used as the sole basis for treatment or other patient management decisions. A negative result may occur with  improper specimen collection/handling, submission of specimen other than nasopharyngeal swab, presence of viral mutation(s) within the areas targeted by this assay, and inadequate number of viral copies(<138 copies/mL). A negative result must be combined with clinical observations, patient history, and epidemiological information. The expected result is Negative.  Fact Sheet for Patients:  EntrepreneurPulse.com.au  Fact Sheet for Healthcare Providers:  IncredibleEmployment.be  This test is no t yet approved or cleared by the [place] FDA and  has been authorized for detection and/or diagnosis of SARS-CoV-2 by FDA under an Emergency Use Authorization (EUA). This EUA will remain  in effect (meaning this test can be used) for the duration of the COVID-19 declaration under Section 564(b)(1) of the Act, 21 U.S.C.section 360bbb-3(b)(1), unless the authorization is terminated  or  revoked sooner.       Influenza A by PCR NEGATIVE NEGATIVE Final   Influenza B by PCR NEGATIVE NEGATIVE Final    Comment: (NOTE) The Xpert Xpress SARS-CoV-2/FLU/RSV plus assay is intended as an aid in the diagnosis of influenza from Nasopharyngeal swab specimens and should not be used as a sole basis for treatment. Nasal washings and aspirates are unacceptable for Xpert Xpress SARS-CoV-2/FLU/RSV testing.  Fact Sheet for Patients: EntrepreneurPulse.com.au  Fact Sheet for Healthcare Providers: IncredibleEmployment.be  This test is not yet approved or cleared by the  [place] FDA and has been authorized for detection and/or diagnosis of SARS-CoV-2 by FDA under an Emergency Use Authorization (EUA). This EUA will remain in effect (meaning this test can be used) for the duration of the COVID-19 declaration under Section 564(b)(1) of the Act, 21 U.S.C. section 360bbb-3(b)(1), unless the authorization is terminated or revoked.  Performed at [facility], [place] [address], [place], [place] [address]          Radiology Studies: CT ABDOMEN PELVIS WO CONTRAST  Result Date: 03/21/2021 CLINICAL DATA:  76 year old male with history of nausea, vomiting and abdominal pain. EXAM: CT ABDOMEN AND PELVIS WITHOUT CONTRAST TECHNIQUE: Multidetector CT imaging of the abdomen and pelvis was performed following the standard protocol without IV contrast. RADIATION DOSE REDUCTION: This exam was performed according to the departmental dose-optimization program which includes automated exposure control, adjustment of the mA and/or kV according to patient size and/or use of iterative reconstruction technique. COMPARISON:  CT the abdomen and pelvis 03/25/2014. FINDINGS: Lower chest: Atherosclerotic calcifications in the thoracic aorta as well as the left main, left anterior descending, left circumflex and right coronary arteries. Hepatobiliary: No definite suspicious cystic or solid hepatic lesions are confidently identified on today's noncontrast CT examination. Unenhanced appearance of the gallbladder is unremarkable. Pancreas: No definite pancreatic mass or peripancreatic fluid collections or inflammatory changes are noted on today's noncontrast CT examination. Spleen: Unremarkable. Adrenals/Urinary Tract: No calcifications are identified within the collecting system of either kidney, along the course of either ureter, or within the lumen of the urinary bladder. There is no hydroureteronephrosis. In the upper pole of the right kidney (axial image 37 of series 3) there is a 2.1  cm low-attenuation lesion which is incompletely characterized on today's noncontrast CT examination, but statistically likely to represent a cyst. Unenhanced appearance of the left kidney and bilateral adrenal glands is otherwise normal. Urinary bladder is normal in appearance. Stomach/Bowel: Unenhanced appearance of the stomach is normal. There is no pathologic dilatation of small bowel or colon. The appendix is not confidently identified and may be surgically absent. Regardless, there are no inflammatory changes noted adjacent to the cecum to suggest the presence of an acute appendicitis at this time. Vascular/Lymphatic: Atherosclerotic calcifications in the abdominal aorta. No lymphadenopathy noted in the abdomen or pelvis. Reproductive: Prostate gland is enlarged measuring 5.7 x 5.7 cm. Seminal vesicles are unremarkable in appearance. Other: No significant volume of ascites.  No pneumoperitoneum. Musculoskeletal: There are no aggressive appearing lytic or blastic lesions noted in the visualized portions of the skeleton. IMPRESSION: 1. No acute findings are noted in the abdomen or pelvis to account for the patient's symptoms. 2. Aortic atherosclerosis, in addition to left main and three-vessel coronary artery disease. Assessment for potential risk factor modification, dietary therapy or pharmacologic therapy may be warranted, if clinically indicated. 3. Prostatomegaly. 4. Additional incidental findings, as above. Electronically Signed   By: Mauri Brooklyn.D.  On: 03/21/2021 11:06        Scheduled Meds:  allopurinol  300 mg Oral q morning   amantadine  100 mg Oral BID   amLODipine  5 mg Oral Daily   carbidopa-levodopa  2 tablet Oral 2 times per day   carbidopa-levodopa  1 tablet Oral BID   donepezil  10 mg Oral q morning   enoxaparin (LOVENOX) injection  30 mg Subcutaneous Q24H   fidaxomicin  200 mg Oral BID   insulin aspart  0-9 Units Subcutaneous TID WC   pantoprazole  40 mg Oral Daily    Continuous Infusions:  lactated ringers 75 mL/hr at 03/22/21 0241     LOS: 1 day   Time spent: 71min  Ronneisha Jett C Karrina Lye, DO [facility]  If 7PM-7AM, please contact night-coverage [email or web address]  03/22/2021, 7:51 AM

## 2021-03-22 NOTE — Progress Notes (Signed)
New Admission Note:   Arrival Method:  stretcher Mental Orientation: alert x 3 Telemetry: none Assessment: Completed Skin: see flowsheet IV: NSL Pain: none Tubes: flexiseal Safety Measures: Safety Fall Prevention Plan has been discussed Admission: Completed [facility] Orientation: Patient has been orientated to the room, unit and staff.  Family: none at bedside  Orders have been reviewed and implemented. Will continue to monitor the patient. Call light has been placed within reach and bed alarm has been activated.   Rockie Neighbours BSN, RN Phone number: [phone]

## 2021-03-22 NOTE — Consult Note (Signed)
WOC Nurse Consult Note: Patient receiving care in [facility] 5M9. Primary RN, Sami, in room at time of my assessment. Reason for Consult: unstageable PI to sacrum, red scrotum Wound type: there was a barely discernible approximately 0.1 cm x 0.1 cm pink spot on the coccyx that is MASD-IAD.  The scrotum is slightly reddened.  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  The patient now has a Flexiseal fecal containment system and an external urinary collection pouch. Pressure Injury POA: Yes/No/NA Measurement: Wound bed: pink Drainage (amount, consistency, odor) none Periwound: intact Dressing procedure/placement/frequency: Ensure scrotum and sacral/coccyx areas are clean and dry, then apply Criticaid Clear (purple and white tube in clean utility) to areas. Provide each shift and prn after each cleansing. This topical treatment was provided at the time of my visit. Thank you for the consult.  Discussed plan of care with the patient and bedside nurse.  Laie nurse will not follow at this time.  Please re-consult the [place] team if needed.  Val Riles, RN, MSN, CWOCN, CNS-BC, pager [phone]

## 2021-03-22 NOTE — Progress Notes (Addendum)
°  Transition of Care [facility]) Screening Note   Patient Details  Name: Tyler Lester Date of Birth: Jan 10, 1946   Transition of Care [facility]) CM/SW Contact:    Tom-Johnson, Renea Ee, RN Phone Number: 03/22/2021, 12:38 PM  Patient is from home and brother lives with him. Has a care giver named Felicia Monday to Sunday from 11 am to 9 pm. Admitted for Diarrhea. Recently admitted and went home with Home health PT/OT. Patient gave CM permission to speak with [facility]. [name] states that they received a call from [place] once and no one showed up. PCP is Rayford Halsted and outpatient PT ordered from PCP's office and they are awaiting call to schedule appointment. CM told Felicia to call [facility] and get appointment scheduled. CM called [facility] and spoke with Tommi Rumps [phone]) and notified him of Felicia's report. Cory to find out what happened and get back with CM. Patient has all necessary DME's at home.  Transition of Care Department [facility]) has reviewed patient and no TOC needs have been identified at this time. We will continue to monitor patient advancement through interdisciplinary progression rounds. If new patient transition needs arise, please place a TOC consult.  13:34- Received a response from [place] with [facility] via E-mail and it states:  "Short Answer: No showed appts and then didn't answer the phone".   "Long answer: Called client again to coordinate [facility]; client refusing [place] until Thursday, 03/02/21, stating he will "be busy until then". MD notified of client's request and in agreement with delayed SOC date.  PT called patient and caregiver on Wednesday evening and Thursday as well, left several messages but no response. PT informed office regarding this and sent back [place]. Left VM message to schedule another SOC date."   Patient currently have a prescription for outpatient PT with [facility] by his PCP. CM informed Felicia, caregiver to call and schedule appointment. CM will continue to follow with  needs.

## 2021-03-22 NOTE — ED Notes (Signed)
This RN and [place], RN cleaned pt (peri care and foley care); linens, gown, and blankets changed. Pt resting comfortably.

## 2021-03-22 NOTE — TOC Benefit Eligibility Note (Signed)
Patient Teacher, English as a foreign language completed.    The patient is currently admitted and upon discharge could be taking Dificid 200 mg tablets.  The current 30 day co-pay is, $0.00.   The patient is insured through [employer] Patient Advocate Specialist [place] Patient Advocate Team Direct Number: [phone]  Fax: [phone]

## 2021-03-23 ENCOUNTER — Other Ambulatory Visit (HOSPITAL_COMMUNITY): Payer: Self-pay

## 2021-03-23 LAB — BASIC METABOLIC PANEL
Anion gap: 8 (ref 5–15)
BUN: 23 mg/dL (ref 8–23)
CO2: 25 mmol/L (ref 22–32)
Calcium: 8.8 mg/dL — ABNORMAL LOW (ref 8.9–10.3)
Chloride: 105 mmol/L (ref 98–111)
Creatinine, Ser: 1.53 mg/dL — ABNORMAL HIGH (ref 0.61–1.24)
GFR, Estimated: 48 mL/min — ABNORMAL LOW (ref >60)
Glucose, Bld: 75 mg/dL (ref 70–99)
Potassium: 3.6 mmol/L (ref 3.5–5.1)
Sodium: 138 mmol/L (ref 135–145)

## 2021-03-23 LAB — CBC
HCT: 43.9 % (ref 39.0–52.0)
Hemoglobin: 14.3 g/dL (ref 13.0–17.0)
MCH: 28.4 pg (ref 26.0–34.0)
MCHC: 32.6 g/dL (ref 30.0–36.0)
MCV: 87.1 fL (ref 80.0–100.0)
Platelets: 186 10*3/uL (ref 150–400)
RBC: 5.04 MIL/uL (ref 4.22–5.81)
RDW: 13.4 % (ref 11.5–15.5)
WBC: 8.4 10*3/uL (ref 4.0–10.5)
nRBC: 0 % (ref 0.0–0.2)

## 2021-03-23 LAB — GLUCOSE, CAPILLARY
Glucose-Capillary: 75 mg/dL (ref 70–99)
Glucose-Capillary: 76 mg/dL (ref 70–99)

## 2021-03-23 MED ORDER — FIDAXOMICIN 200 MG PO TABS
200.0000 mg | ORAL_TABLET | Freq: Two times a day (BID) | ORAL | 0 refills | Status: AC
  Filled 2021-03-23: qty 16, 8d supply, fill #0

## 2021-03-23 NOTE — Progress Notes (Signed)
[facility]M09 [facility] Hospital Liaison note:  This patient is currently enrolled in [facility] outpatient-based Palliative Care. Will continue to follow for disposition.  Please call with any outpatient palliative questions or concerns.  Thank you, Dee Curry, LPN [facility] Hospital Liaison [phone] 

## 2021-03-23 NOTE — Progress Notes (Signed)
Patient discharge teaching given, including activity, diet, follow-up appoints, and medications. Patient verbalized understanding of all discharge instructions. IV access was d/c'd. Vitals are stable. Skin is intact except as charted in most recent assessments. Pt to be escorted out by NT, to be driven home by family. 

## 2021-03-23 NOTE — TOC Transition Note (Signed)
Transition of Care [facility]) - CM/SW Discharge Note   Patient Details  Name: Tyler Lester MRN: [id number] Date of Birth: 27-Dec-1945  Transition of Care [facility]) CM/SW Contact:  Tom-Johnson, Renea Ee, RN Phone Number: 03/23/2021, 3:11 PM   Clinical Narrative:     Patient is scheduled for discharge today. No recommendations noted. Patient to call [facility] to schedule outpatient PT from previous PCP order. Felicia to transport at discharge. No further TOC needs noted.  Final next level of care: Home/Self Care Barriers to Discharge: Barriers Resolved   Patient Goals and CMS Choice Patient states their goals for this hospitalization and ongoing recovery are:: To return home CMS Medicare.gov Compare Post Acute Care list provided to:: Patient Choice offered to / list presented to : NA  Discharge Placement                Patient to be transferred to facility by: [facility]      Discharge Plan and Services                DME Arranged: N/A DME Agency: NA         HH Agency: NA        Social Determinants of Health (SDOH) Interventions     Readmission Risk Interventions No flowsheet data found.

## 2021-03-23 NOTE — Discharge Summary (Signed)
Physician Discharge Summary  Tyler Lester [id number]:[id number] DOB: 07-Jan-1946 DOA: 03/20/2021  PCP: [facility]  Admit date: 03/20/2021 Discharge date: 03/23/2021  Admitted From: Home Disposition: Home  Recommendations for Outpatient Follow-up:  Follow up with PCP in 1-2 weeks Please obtain BMP/CBC in one week  Discharge Condition: Stable CODE STATUS: DNR Diet recommendation: Low-salt low-fat bland diet  Brief/Interim Summary: Tyler Lester is a 76 y.o. male with medical history significant of Parkinson's; HTN; stage 3 CKDb; DM; chronic diastolic CHF: and BPH with urinary retention presenting with persistent diarrhea.  Previously evaluated for UTI on antibiotics with subsequently noted worsening diarrhea testing positive for C. difficile colitis in the outpatient setting -transition from vancomycin to Dificid with little to no improvement and presented back to the ED with greater than 10 bowel movements per 24 hours concerning for marked volume loss and uncontrolled C. difficile infection.     Assessment & Plan:   Acute intractable diarrhea secondary to recurrent versus ongoing C. difficile infection- (present on admission) -Bowel movements continue to improve in both frequency, number and consistency -Discharge on remainder of Dificid. -Lengthy discussion at bedside educating patient that his stool will not be normal for quite some time, it appears he may mistake soft stool for diarrhea given our lengthy conversation   BPH with Refracotry Urinary Retention- (present on admission) -Not currently on medications, defer to outpatient PCP   Type 2 diabetes mellitus with complication ([place])- (present on admission) -Well-controlled with recent A1c 6.1   Parkinson's disease ([place])- (present on admission) -Previously recommended for SNF but patient declined -He does have a full-time daytime caregiver who reports an ability to continue to care for him at home -Continue Sinemet,  Amantadine, Neurontin, Aricept   Acute renal failure superimposed on stage 3b chronic kidney disease ([place])- (present on admission) -Resolving with p.o. intake   Chronic diastolic heart failure ([place])- (present on admission) -Not in acute exacerbation, volume down if anything -Will monitor with ongoing hydration   Essential hypertension- (present on admission) -Continue Norvasc  Discharge Instructions  Discharge Instructions     Discharge patient   Complete by: As directed    Discharge disposition: 01-Home or Self Care   Discharge patient date: 03/23/2021      Allergies as of 03/23/2021       Reactions   Poison Ivy Treatments Hives        Medication List     TAKE these medications    allopurinol 300 MG tablet Commonly known as: ZYLOPRIM Take 300 mg by mouth every morning.   amantadine 100 MG capsule Commonly known as: SYMMETREL Take 100 mg by mouth 2 (two) times daily.   amLODipine 5 MG tablet Commonly known as: NORVASC Take 1 tablet (5 mg total) by mouth daily.   carbidopa-levodopa 25-250 MG tablet Commonly known as: SINEMET IR Take 1 tablet by mouth See admin instructions. Take one tablet by mouth at 9am and 1pm   carbidopa-levodopa 50-200 MG tablet Commonly known as: SINEMET CR Take 2 tablets by mouth See admin instructions. 2 tablets at 5pm and bedtime (9-10pm)   cyclobenzaprine 5 MG tablet Commonly known as: FLEXERIL Take 5 mg by mouth 3 (three) times daily as needed for muscle spasms.   diclofenac Sodium 1 % Gel Commonly known as: VOLTAREN Apply 2-4 g topically daily as needed (leg and hip pain).   Dificid 200 MG Tabs tablet Generic drug: fidaxomicin Take 1 tablet (200 mg total) by mouth 2 (two) times daily for 8  days.   donepezil 10 MG tablet Commonly known as: ARICEPT Take 1 tablet by mouth every day What changed: when to take this   gabapentin 600 MG tablet Commonly known as: NEURONTIN Take 600 mg by mouth daily as needed (nerve pain).    hydrOXYzine 50 MG tablet Commonly known as: ATARAX Take 50 mg by mouth at bedtime as needed (sleep).   melatonin 5 MG Tabs Take 5 mg by mouth at bedtime as needed (sleep).   naloxone 4 MG/0.1ML Liqd nasal spray kit Commonly known as: NARCAN Place 4 mg into the nose once as needed (opioid overdose).   omeprazole 40 MG capsule Commonly known as: PRILOSEC Take 80 mg by mouth every morning.   oxyCODONE-acetaminophen 10-325 MG tablet Commonly known as: PERCOCET Take 1 tablet by mouth 2 (two) times daily as needed for pain.   Vitamin D (Ergocalciferol) 1.25 MG (50000 UNIT) Caps capsule Commonly known as: DRISDOL Take 50,000 Units by mouth every Sunday.        Allergies  Allergen Reactions   Poison Ivy Treatments Hives    Consultations: None  Procedures/Studies: CT ABDOMEN PELVIS WO CONTRAST  Result Date: 03/21/2021 CLINICAL DATA:  76 year old male with history of nausea, vomiting and abdominal pain. EXAM: CT ABDOMEN AND PELVIS WITHOUT CONTRAST TECHNIQUE: Multidetector CT imaging of the abdomen and pelvis was performed following the standard protocol without IV contrast. RADIATION DOSE REDUCTION: This exam was performed according to the departmental dose-optimization program which includes automated exposure control, adjustment of the mA and/or kV according to patient size and/or use of iterative reconstruction technique. COMPARISON:  CT the abdomen and pelvis 03/25/2014. FINDINGS: Lower chest: Atherosclerotic calcifications in the thoracic aorta as well as the left main, left anterior descending, left circumflex and right coronary arteries. Hepatobiliary: No definite suspicious cystic or solid hepatic lesions are confidently identified on today's noncontrast CT examination. Unenhanced appearance of the gallbladder is unremarkable. Pancreas: No definite pancreatic mass or peripancreatic fluid collections or inflammatory changes are noted on today's noncontrast CT examination. Spleen:  Unremarkable. Adrenals/Urinary Tract: No calcifications are identified within the collecting system of either kidney, along the course of either ureter, or within the lumen of the urinary bladder. There is no hydroureteronephrosis. In the upper pole of the right kidney (axial image 37 of series 3) there is a 2.1 cm low-attenuation lesion which is incompletely characterized on today's noncontrast CT examination, but statistically likely to represent a cyst. Unenhanced appearance of the left kidney and bilateral adrenal glands is otherwise normal. Urinary bladder is normal in appearance. Stomach/Bowel: Unenhanced appearance of the stomach is normal. There is no pathologic dilatation of small bowel or colon. The appendix is not confidently identified and may be surgically absent. Regardless, there are no inflammatory changes noted adjacent to the cecum to suggest the presence of an acute appendicitis at this time. Vascular/Lymphatic: Atherosclerotic calcifications in the abdominal aorta. No lymphadenopathy noted in the abdomen or pelvis. Reproductive: Prostate gland is enlarged measuring 5.7 x 5.7 cm. Seminal vesicles are unremarkable in appearance. Other: No significant volume of ascites.  No pneumoperitoneum. Musculoskeletal: There are no aggressive appearing lytic or blastic lesions noted in the visualized portions of the skeleton. IMPRESSION: 1. No acute findings are noted in the abdomen or pelvis to account for the patient's symptoms. 2. Aortic atherosclerosis, in addition to left main and three-vessel coronary artery disease. Assessment for potential risk factor modification, dietary therapy or pharmacologic therapy may be warranted, if clinically indicated. 3. Prostatomegaly. 4. Additional incidental findings,  as above. Electronically Signed   By: Vinnie Langton M.D.   On: 03/21/2021 11:06     Subjective: No acute issues or events overnight   Discharge Exam: Vitals:   03/23/21 0438 03/23/21 0841  BP:  102/61 98/86  Pulse: 78 77  Resp: 17 16  Temp: (!) 97.4 F (36.3 C) (!) 97.5 F (36.4 C)  SpO2: 98% 99%   Vitals:   03/22/21 1649 03/22/21 2053 03/23/21 0438 03/23/21 0841  BP: (!) 131/97 (!) 152/70 102/61 98/86  Pulse: 80 86 78 77  Resp: _0 Temp: 98.4 F (36.9 C) 98.4 F (36.9 C) (!) 97.4 F (36.3 C) (!) 97.5 F (36.4 C)  TempSrc:    Oral  SpO2: 95% 97% 98% 99%    General: Pt is alert, awake, not in acute distress Cardiovascular: RRR, S1/S2 +, no rubs, no gallops Respiratory: CTA bilaterally, no wheezing, no rhonchi Abdominal: Soft, NT, ND, bowel sounds + Extremities: no edema, no cyanosis    The results of significant diagnostics from this hospitalization (including imaging, microbiology, ancillary and laboratory) are listed below for reference.     Microbiology: Recent Results (from the past 240 hour(s))  Resp Panel by RT-PCR (Flu A&B, Covid) Nasopharyngeal Swab     Status: None   Collection Time: 03/21/21  1:10 PM   Specimen: Nasopharyngeal Swab; Nasopharyngeal(NP) swabs in vial transport medium  Result Value Ref Range Status   SARS Coronavirus 2 by RT PCR NEGATIVE NEGATIVE Final    Comment: (NOTE) SARS-CoV-2 target nucleic acids are NOT DETECTED.  The SARS-CoV-2 RNA is generally detectable in upper respiratory specimens during the acute phase of infection. The lowest concentration of SARS-CoV-2 viral copies this assay can detect is 138 copies/mL. A negative result does not preclude SARS-Cov-2 infection and should not be used as the sole basis for treatment or other patient management decisions. A negative result may occur with  improper specimen collection/handling, submission of specimen other than nasopharyngeal swab, presence of viral mutation(s) within the areas targeted by this assay, and inadequate number of viral copies(<138 copies/mL). A negative result must be combined with clinical observations, patient history, and  epidemiological information. The expected result is Negative.  Fact Sheet for Patients:  EntrepreneurPulse.com.au  Fact Sheet for Healthcare Providers:  IncredibleEmployment.be  This test is no t yet approved or cleared by the [place] FDA and  has been authorized for detection and/or diagnosis of SARS-CoV-2 by FDA under an Emergency Use Authorization (EUA). This EUA will remain  in effect (meaning this test can be used) for the duration of the COVID-19 declaration under Section 564(b)(1) of the Act, 21 U.S.C.section 360bbb-3(b)(1), unless the authorization is terminated  or revoked sooner.       Influenza A by PCR NEGATIVE NEGATIVE Final   Influenza B by PCR NEGATIVE NEGATIVE Final    Comment: (NOTE) The Xpert Xpress SARS-CoV-2/FLU/RSV plus assay is intended as an aid in the diagnosis of influenza from Nasopharyngeal swab specimens and should not be used as a sole basis for treatment. Nasal washings and aspirates are unacceptable for Xpert Xpress SARS-CoV-2/FLU/RSV testing.  Fact Sheet for Patients: EntrepreneurPulse.com.au  Fact Sheet for Healthcare Providers: IncredibleEmployment.be  This test is not yet approved or cleared by the [place] FDA and has been authorized for detection and/or diagnosis of SARS-CoV-2 by FDA under an Emergency Use Authorization (EUA). This EUA will remain in effect (meaning this test can be used) for the duration of the COVID-19 declaration under  Section 564(b)(1) of the Act, 21 U.S.C. section 360bbb-3(b)(1), unless the authorization is terminated or revoked.  Performed at [facility], [place] [address] [place], [place] [address]   C Difficile Quick Screen w PCR reflex     Status: Abnormal   Collection Time: 03/22/21 10:20 AM   Specimen: STOOL  Result Value Ref Range Status   C Diff antigen POSITIVE (A) NEGATIVE Final   C Diff toxin NEGATIVE NEGATIVE  Final   C Diff interpretation Results are indeterminate. See PCR results.  Final    Comment: Performed at [facility], [place] [address], [place], [place] [address]  Gastrointestinal Panel by PCR , Stool     Status: None   Collection Time: 03/22/21 10:20 AM   Specimen: Stool  Result Value Ref Range Status   Campylobacter species NOT DETECTED NOT DETECTED Final   Plesimonas shigelloides NOT DETECTED NOT DETECTED Final   Salmonella species NOT DETECTED NOT DETECTED Final   Yersinia enterocolitica NOT DETECTED NOT DETECTED Final   Vibrio species NOT DETECTED NOT DETECTED Final   Vibrio cholerae NOT DETECTED NOT DETECTED Final   Enteroaggregative E coli (EAEC) NOT DETECTED NOT DETECTED Final   Enteropathogenic E coli (EPEC) NOT DETECTED NOT DETECTED Final   Enterotoxigenic E coli (ETEC) NOT DETECTED NOT DETECTED Final   Shiga like toxin producing E coli (STEC) NOT DETECTED NOT DETECTED Final   Shigella/Enteroinvasive E coli (EIEC) NOT DETECTED NOT DETECTED Final   Cryptosporidium NOT DETECTED NOT DETECTED Final   Cyclospora cayetanensis NOT DETECTED NOT DETECTED Final   Entamoeba histolytica NOT DETECTED NOT DETECTED Final   Giardia lamblia NOT DETECTED NOT DETECTED Final   Adenovirus F40/41 NOT DETECTED NOT DETECTED Final   Astrovirus NOT DETECTED NOT DETECTED Final   Norovirus GI/GII NOT DETECTED NOT DETECTED Final   Rotavirus A NOT DETECTED NOT DETECTED Final   Sapovirus (I, II, IV, and V) NOT DETECTED NOT DETECTED Final    Comment: Performed at [facility], [place] [place], [place] [address]  C. Diff by PCR, Reflexed     Status: Abnormal   Collection Time: 03/22/21 10:20 AM  Result Value Ref Range Status   Toxigenic C. Difficile by PCR POSITIVE (A) NEGATIVE Final    Comment: Positive for toxigenic C. difficile with little to no toxin production. Only treat if clinical presentation suggests symptomatic illness. Performed at [facility], [place] [address], [place], [place] [address]      Labs: BNP (last 3 results) No results for input(s): BNP in the last 8760 hours. Basic Metabolic Panel: Recent Labs  Lab 03/20/21 1807 03/22/21 0240 03/23/21 0651  NA 141 137 138  K 3.6 3.3* 3.6  CL 106 105 105  CO2 _0 GLUCOSE 105* 62* 75  BUN 45* 37* 23  CREATININE 3.13* 2.13* 1.53*  CALCIUM 9.1 9.0 8.8*   Liver Function Tests: Recent Labs  Lab 03/20/21 1807  AST 11*  ALT 6  ALKPHOS 55  BILITOT 0.5  PROT 7.8  ALBUMIN 3.3*   Recent Labs  Lab 03/20/21 1807  LIPASE 60*   No results for input(s): AMMONIA in the last 168 hours. CBC: Recent Labs  Lab 03/20/21 1807 03/22/21 0240 03/23/21 0651  WBC 8.4 10.1 8.4  HGB 15.2 15.6 14.3  HCT 49.7 48.0 43.9  MCV 90.0 87.8 87.1  PLT 229 210 186   Cardiac Enzymes: No results for input(s): CKTOTAL, CKMB, CKMBINDEX, TROPONINI in the last  168 hours. BNP: Invalid input(s): POCBNP CBG: Recent Labs  Lab 03/22/21 1154 03/22/21 1635 03/22/21 2049 03/23/21 0734 03/23/21 1135  GLUCAP 76 82 79 75 76   D-Dimer No results for input(s): DDIMER in the last 72 hours. Hgb A1c No results for input(s): HGBA1C in the last 72 hours. Lipid Profile No results for input(s): CHOL, HDL, LDLCALC, TRIG, CHOLHDL, LDLDIRECT in the last 72 hours. Thyroid function studies No results for input(s): TSH, T4TOTAL, T3FREE, THYROIDAB in the last 72 hours.  Invalid input(s): FREET3 Anemia work up No results for input(s): VITAMINB12, FOLATE, FERRITIN, TIBC, IRON, RETICCTPCT in the last 72 hours. Urinalysis    Component Value Date/Time   COLORURINE AMBER (A) 02/17/2021 1455   APPEARANCEUR CLOUDY (A) 02/17/2021 1455   LABSPEC 1.020 02/17/2021 1455   PHURINE 5.5 02/17/2021 1455   GLUCOSEU 100 (A) 02/17/2021 1455   HGBUR LARGE (A) 02/17/2021 1455   BILIRUBINUR NEGATIVE 02/17/2021 1455   BILIRUBINUR NEGATIVE 05/27/2014 1020   KETONESUR NEGATIVE 02/17/2021 1455   PROTEINUR 30 (A) 02/17/2021 1455    UROBILINOGEN 1.0 10/29/2014 0042   NITRITE NEGATIVE 02/17/2021 1455   LEUKOCYTESUR TRACE (A) 02/17/2021 1455   Sepsis Labs Invalid input(s): PROCALCITONIN,  WBC,  LACTICIDVEN Microbiology Recent Results (from the past 240 hour(s))  Resp Panel by RT-PCR (Flu A&B, Covid) Nasopharyngeal Swab     Status: None   Collection Time: 03/21/21  1:10 PM   Specimen: Nasopharyngeal Swab; Nasopharyngeal(NP) swabs in vial transport medium  Result Value Ref Range Status   SARS Coronavirus 2 by RT PCR NEGATIVE NEGATIVE Final    Comment: (NOTE) SARS-CoV-2 target nucleic acids are NOT DETECTED.  The SARS-CoV-2 RNA is generally detectable in upper respiratory specimens during the acute phase of infection. The lowest concentration of SARS-CoV-2 viral copies this assay can detect is 138 copies/mL. A negative result does not preclude SARS-Cov-2 infection and should not be used as the sole basis for treatment or other patient management decisions. A negative result may occur with  improper specimen collection/handling, submission of specimen other than nasopharyngeal swab, presence of viral mutation(s) within the areas targeted by this assay, and inadequate number of viral copies(<138 copies/mL). A negative result must be combined with clinical observations, patient history, and epidemiological information. The expected result is Negative.  Fact Sheet for Patients:  EntrepreneurPulse.com.au  Fact Sheet for Healthcare Providers:  IncredibleEmployment.be  This test is no t yet approved or cleared by the [place] FDA and  has been authorized for detection and/or diagnosis of SARS-CoV-2 by FDA under an Emergency Use Authorization (EUA). This EUA will remain  in effect (meaning this test can be used) for the duration of the COVID-19 declaration under Section 564(b)(1) of the Act, 21 U.S.C.section 360bbb-3(b)(1), unless the authorization is terminated  or revoked  sooner.       Influenza A by PCR NEGATIVE NEGATIVE Final   Influenza B by PCR NEGATIVE NEGATIVE Final    Comment: (NOTE) The Xpert Xpress SARS-CoV-2/FLU/RSV plus assay is intended as an aid in the diagnosis of influenza from Nasopharyngeal swab specimens and should not be used as a sole basis for treatment. Nasal washings and aspirates are unacceptable for Xpert Xpress SARS-CoV-2/FLU/RSV testing.  Fact Sheet for Patients: EntrepreneurPulse.com.au  Fact Sheet for Healthcare Providers: IncredibleEmployment.be  This test is not yet approved or cleared by the [place] FDA and has been authorized for detection and/or diagnosis of SARS-CoV-2 by FDA under an Emergency Use Authorization (EUA). This EUA will remain in effect (  meaning this test can be used) for the duration of the COVID-19 declaration under Section 564(b)(1) of the Act, 21 U.S.C. section 360bbb-3(b)(1), unless the authorization is terminated or revoked.  Performed at [facility], [place] [address] [place], [place] [address]   C Difficile Quick Screen w PCR reflex     Status: Abnormal   Collection Time: 03/22/21 10:20 AM   Specimen: STOOL  Result Value Ref Range Status   C Diff antigen POSITIVE (A) NEGATIVE Final   C Diff toxin NEGATIVE NEGATIVE Final   C Diff interpretation Results are indeterminate. See PCR results.  Final    Comment: Performed at [facility], [place] [address] [place], [place] [address]  Gastrointestinal Panel by PCR , Stool     Status: None   Collection Time: 03/22/21 10:20 AM   Specimen: Stool  Result Value Ref Range Status   Campylobacter species NOT DETECTED NOT DETECTED Final   Plesimonas shigelloides NOT DETECTED NOT DETECTED Final   Salmonella species NOT DETECTED NOT DETECTED Final   Yersinia enterocolitica NOT DETECTED NOT DETECTED Final   Vibrio species NOT DETECTED NOT DETECTED Final   Vibrio cholerae NOT DETECTED NOT DETECTED Final    Enteroaggregative E coli (EAEC) NOT DETECTED NOT DETECTED Final   Enteropathogenic E coli (EPEC) NOT DETECTED NOT DETECTED Final   Enterotoxigenic E coli (ETEC) NOT DETECTED NOT DETECTED Final   Shiga like toxin producing E coli (STEC) NOT DETECTED NOT DETECTED Final   Shigella/Enteroinvasive E coli (EIEC) NOT DETECTED NOT DETECTED Final   Cryptosporidium NOT DETECTED NOT DETECTED Final   Cyclospora cayetanensis NOT DETECTED NOT DETECTED Final   Entamoeba histolytica NOT DETECTED NOT DETECTED Final   Giardia lamblia NOT DETECTED NOT DETECTED Final   Adenovirus F40/41 NOT DETECTED NOT DETECTED Final   Astrovirus NOT DETECTED NOT DETECTED Final   Norovirus GI/GII NOT DETECTED NOT DETECTED Final   Rotavirus A NOT DETECTED NOT DETECTED Final   Sapovirus (I, II, IV, and V) NOT DETECTED NOT DETECTED Final    Comment: Performed at [facility], [place]., [place], [place] [address]  C. Diff by PCR, Reflexed     Status: Abnormal   Collection Time: 03/22/21 10:20 AM  Result Value Ref Range Status   Toxigenic C. Difficile by PCR POSITIVE (A) NEGATIVE Final    Comment: Positive for toxigenic C. difficile with little to no toxin production. Only treat if clinical presentation suggests symptomatic illness. Performed at [facility], [place] [address] [place], [place] [address]      Time coordinating discharge: Over 30 minutes  SIGNED:   Little Ishikawa, DO [facility] 03/23/2021, 5:34 PM Pager   If 7PM-7AM, please contact night-coverage [email or web address]

## 2021-03-28 ENCOUNTER — Other Ambulatory Visit: Payer: Self-pay

## 2021-03-28 ENCOUNTER — Other Ambulatory Visit: Payer: Medicare Other | Admitting: Internal Medicine

## 2021-03-28 MED ORDER — DONEPEZIL HCL 10 MG PO TABS: 10.0000 mg | ORAL_TABLET | Freq: Every morning | ORAL | 3 refills | Status: AC

## 2021-03-28 NOTE — Progress Notes (Deleted)
[facility] Palliative Care Follow-Up Visit Telephone: [phone]  Fax: [phone]   Date of encounter: 03/28/21 12:09 PM PATIENT NAME: Tyler Lester [address] [place] [address]   [phone] (home)  DOB: 04/29/1945 MRN: [id number] PRIMARY CARE PROVIDER:    [facility],  [place] [address] [phone]  [place], [facility] [address] [place],  [place] [address] [phone]  RESPONSIBLE PARTY:    Contact Information     Name Relation Home Work Mobile   Tyler Lester   [phone]        I met face to face with patient and family in *** home/facility. Palliative Care was asked to follow this patient by consultation request of  [facility] [place] to address advance care planning and complex medical decision making. This is follow-up visit.                                     ASSESSMENT AND PLAN / RECOMMENDATIONS:   Advance Care Planning/Goals of Care: Goals include to maximize quality of life and symptom management. Patient/health care surrogate gave his/her permission to discuss.Our advance care planning conversation included a discussion about:    The value and importance of advance care planning  Experiences with loved ones who have been seriously ill or have died  Exploration of personal, cultural or spiritual beliefs that might influence medical decisions  Exploration of goals of care in the event of a sudden injury or illness  Identification  of a healthcare agent  Review and updating or creation of an  advance directive document . Decision not to resuscitate or to de-escalate disease focused treatments due to poor prognosis. CODE STATUS:  Symptom Management/Plan:    Follow up Palliative Care Visit: Palliative care will continue to follow for complex medical decision making, advance care planning, and clarification of goals. Return  *** weeks or prn.  I spent *** minutes providing this consultation. More than 50% of the time in this consultation was spent in counseling and care coordination.  This visit was coded based on medical decision making (MDM).***  PPS: ***0%  HOSPICE ELIGIBILITY/DIAGNOSIS: TBD  Chief Complaint: Follow-up palliative visit  HISTORY OF PRESENT ILLNESS:  Tyler Lester is a 76 y.o. year old male with Parkinson's disease, cervical stenosis, htn, dm2, ckd, sialorrhea and recent admissions in dec and jan with UTI, sepsis secondary to UTI and acute renal failure, then c diff colitis with acute on chronic renal failure.  He was discharged home most recently on 03/24/21 to complete his course of dificid.  His caregiver, Tyler Lester, reported that he was not feeling well this am and did not want to be seen in person, but could speak on the phone.  I called patient, but no answer so voicemail was left.   History obtained from review of EMR, discussion with primary team, and interview with family, facility staff/caregiver and/or Tyler Lester.  I reviewed available labs, medications, imaging, studies and related documents from the EMR.  Records reviewed and summarized above.   ROS  *** General: NAD EYES: denies vision changes ENMT: denies dysphagia Cardiovascular: denies chest pain, denies DOE Pulmonary: denies cough, denies increased SOB Abdomen: endorses good appetite, denies constipation, endorses continence of bowel GU: denies dysuria, endorses continence of urine MSK:  denies increased weakness,  no falls reported Skin: denies rashes or wounds Neurological:  denies pain, denies insomnia Psych: Endorses positive mood Heme/lymph/immuno: denies bruises, abnormal bleeding  Physical Exam: Current and past weights: Constitutional: NAD General: frail appearing, thin/WNWD/obese  EYES: anicteric sclera, lids intact, no discharge  ENMT: intact hearing, oral mucous membranes moist, dentition intact CV:  S1S2, RRR, no LE edema Pulmonary: LCTA, no increased work of breathing, no cough, room air Abdomen: intake 100%, normo-active BS + 4 quadrants, soft and non tender, no ascites GU: deferred MSK: no sarcopenia, moves all extremities, ambulatory Skin: warm and dry, no rashes or wounds on visible skin Neuro:  no generalized weakness,  no cognitive impairment Psych: non-anxious affect, A and O x 3 Hem/lymph/immuno: no widespread bruising CURRENT PROBLEM LIST:  Patient Active Problem List   Diagnosis Date Noted   Diarrhea 03/21/2021   DNR (do not resuscitate) 03/21/2021   Sepsis ([place]) 02/17/2021   back pain and hip pain 02/17/2021   Frequent falls 02/17/2021   Acute focal neurological deficit 03/01/2020   Ischial pain, right 08/25/2019   Palpitations 11/24/2018   Onychomycosis 08/05/2017   Chronic bilateral low back pain with bilateral sciatica 01/30/2017   Memory change 11/09/2016   Diabetic polyneuropathy associated with type 2 diabetes mellitus ([place]) 04/13/2016   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   Diminished hearing, left 01/20/2016   Type 2 diabetes mellitus with complication ([place]) 62/13/0865   Gait abnormality 03/28/2015   Postlaminectomy syndrome, cervical region 07/23/2014   Parkinson's disease ([place]) 07/23/2014   Back pain with sciatica 05/27/2014   Bilateral leg pain 05/14/2014   Depression 10/15/2013   CKD (chronic kidney disease) stage 3, GFR 30-59 ml/min (HCC) 08/25/2013   Tremor 05/23/2013   Acute renal failure superimposed on stage 3 chronic kidney disease (HCC) 05/08/2013   Chronic diastolic heart failure ([place]) 05/06/2013   Onychogryposis of toenail 05/14/2012   Essential hypertension 01/05/2011   PAST MEDICAL HISTORY:  Active Ambulatory Problems    Diagnosis Date Noted   Essential hypertension 01/05/2011   Onychogryposis of toenail 05/14/2012   Chronic diastolic heart failure (HCC) 05/06/2013   Acute renal failure superimposed on stage 3 chronic  kidney disease ([place]) 05/08/2013   Tremor 05/23/2013   CKD (chronic kidney disease) stage 3, GFR 30-59 ml/min (HCC) 08/25/2013   Depression 10/15/2013   Bilateral leg pain 05/14/2014   Back pain with sciatica 05/27/2014   Postlaminectomy syndrome, cervical region 07/23/2014   Parkinson's disease ([place]) 07/23/2014   Gait abnormality 03/28/2015   Type 2 diabetes mellitus with complication ([place]) 78/46/9629   Diminished hearing, left 01/20/2016   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   Diabetic polyneuropathy associated with type 2 diabetes mellitus ([place]) 04/13/2016   Memory change 11/09/2016   Chronic bilateral low back pain with bilateral sciatica 01/30/2017   Onychomycosis 08/05/2017   Palpitations 11/24/2018   Ischial pain, right 08/25/2019   Acute focal neurological deficit 03/01/2020   Sepsis ([place]) 02/17/2021   back pain and hip pain 02/17/2021   Frequent falls 02/17/2021   Diarrhea 03/21/2021   DNR (do not resuscitate) 03/21/2021   Resolved Ambulatory Problems    Diagnosis Date Noted   Prediabetes 01/05/2011   Chronic pain 01/05/2011   Anxiety 01/05/2011   Skin lesion of lower extremity 01/26/2011   Organic impotence 01/26/2011   URI (upper respiratory infection) 05/17/2011   Otitis externa 05/17/2011   GERD (gastroesophageal reflux disease) 07/04/2011   Fatigue 07/05/2011   Preseptal cellulitis 10/23/2011   Diarrhea 10/23/2011   Hearing loss 12/18/2011   Right hip pain 12/18/2011  Community acquired pneumonia 02/09/2012   Urinary retention 02/09/2012   Weight loss 02/09/2012   Court decision 02/09/2012   Cough 03/27/2012   Shoulder pain 03/27/2012   Neck pain on left side 04/25/2012   Bilateral lower extremity edema 05/14/2012   Neck pain on right side 07/14/2012   Knee pain, bilateral 12/12/2012   Gait instability 02/21/2013   Acute upper respiratory infections of unspecified site 04/30/2013   Encounter for chronic pain management 07/13/2013   Soft  tissue mass 08/03/2013   Myelopathy ([place]) 11/12/2013   Tremor of left hand 01/05/2014   Hearing loss of left ear due to cerumen impaction 04/27/2014   Loss of weight 05/27/2014   Spondylosis, cervical, with myelopathy 07/23/2014   Stool incontinence 10/08/2014   Cough 10/08/2014   Left arm swelling 11/11/2014   Hypokalemia 11/11/2014   Shortness of breath 03/28/2015   Dyspnea 06/22/2015   Rhinitis, allergic 08/25/2015   Parkinson disease ([place]) 08/26/2015   Chronic back pain 09/16/2015   Routine adult health maintenance 06/18/2016   Leg swelling 07/27/2016   PAD (peripheral artery disease) ([place]) 07/28/2016   Enrolled in chronic care management 10/02/2016   Coordination of complex care 10/04/2016   Hyperlipidemia 11/05/2016   Care plan discussed with patient 06/27/2017   Carpal tunnel syndrome of left wrist 11/06/2016   Cubital tunnel syndrome on left 11/06/2016   Hypotension 02/09/2019   Acute kidney failure ([place]) 10/13/2020   Past Medical History:  Diagnosis Date   Bilateral foot pain    Diabetes mellitus    Foley catheter in place since nov 2018, last changed 1 week ago   Hypertension    Inguinal hernia    Numbness    Pneumonia yrs ago   SOCIAL HX:  Social History   Tobacco Use   Smoking status: Never   Smokeless tobacco: Never  Substance Use Topics   Alcohol use: Yes    Alcohol/week: 0.0 standard drinks    Comment: 1 shot a day     ALLERGIES:  Allergies  Allergen Reactions   Poison Ivy Treatments Hives     PERTINENT MEDICATIONS:  Outpatient Encounter Medications as of 03/28/2021  Medication Sig   allopurinol (ZYLOPRIM) 300 MG tablet Take 300 mg by mouth every morning.   amantadine (SYMMETREL) 100 MG capsule Take 100 mg by mouth 2 (two) times daily.   amLODipine (NORVASC) 5 MG tablet Take 1 tablet (5 mg total) by mouth daily.   carbidopa-levodopa (SINEMET CR) 50-200 MG tablet Take 2 tablets by mouth See admin instructions. 2 tablets at 5pm and bedtime  (9-10pm)   carbidopa-levodopa (SINEMET IR) 25-250 MG tablet Take 1 tablet by mouth See admin instructions. Take one tablet by mouth at 9am and 1pm   cyclobenzaprine (FLEXERIL) 5 MG tablet Take 5 mg by mouth 3 (three) times daily as needed for muscle spasms.   diclofenac Sodium (VOLTAREN) 1 % GEL Apply 2-4 g topically daily as needed (leg and hip pain).   donepezil (ARICEPT) 10 MG tablet Take 1 tablet (10 mg total) by mouth every morning.   fidaxomicin (DIFICID) 200 MG TABS tablet Take 1 tablet (200 mg total) by mouth 2 (two) times daily for 8 days.   gabapentin (NEURONTIN) 600 MG tablet Take 600 mg by mouth daily as needed (nerve pain).   hydrOXYzine (ATARAX) 50 MG tablet Take 50 mg by mouth at bedtime as needed (sleep).   melatonin 5 MG TABS Take 5 mg by mouth at bedtime as needed (sleep).  naloxone (NARCAN) nasal spray 4 mg/0.1 mL Place 4 mg into the nose once as needed (opioid overdose).   omeprazole (PRILOSEC) 40 MG capsule Take 80 mg by mouth every morning.   oxyCODONE-acetaminophen (PERCOCET) 10-325 MG tablet Take 1 tablet by mouth 2 (two) times daily as needed for pain.   Vitamin D, Ergocalciferol, (DRISDOL) 1.25 MG (50000 UNIT) CAPS capsule Take 50,000 Units by mouth every Sunday.   [DISCONTINUED] donepezil (ARICEPT) 10 MG tablet Take 1 tablet by mouth every day (Patient taking differently: Take 10 mg by mouth every morning.)   No facility-administered encounter medications on file as of 03/28/2021.   Thank you for the opportunity to participate in the care of Mr. Hendrickson.  The palliative care team will continue to follow. Please call our office at [phone] if we can be of additional assistance.   Hollace Kinnier, DO  COVID-19 PATIENT SCREENING TOOL Asked and negative response unless otherwise noted:  Have you had symptoms of covid, tested positive or been in contact with someone with symptoms/positive test in the past 5-10 days? no

## 2021-03-29 NOTE — Progress Notes (Signed)
Pt declined visit day of appt.  Requested phone call but then did not answer call or return call.

## 2021-05-02 ENCOUNTER — Ambulatory Visit: Payer: Medicare Other | Attending: Psychiatry | Admitting: Physical Therapy

## 2021-05-02 DIAGNOSIS — M6281 Muscle weakness (generalized): Secondary | ICD-10-CM | POA: Insufficient documentation

## 2021-05-02 DIAGNOSIS — R2689 Other abnormalities of gait and mobility: Secondary | ICD-10-CM | POA: Insufficient documentation

## 2021-05-02 DIAGNOSIS — R296 Repeated falls: Secondary | ICD-10-CM | POA: Insufficient documentation

## 2021-05-02 DIAGNOSIS — R2681 Unsteadiness on feet: Secondary | ICD-10-CM | POA: Insufficient documentation

## 2021-05-03 ENCOUNTER — Ambulatory Visit: Payer: Medicare Other | Admitting: Physical Therapy

## 2021-05-03 ENCOUNTER — Other Ambulatory Visit: Payer: Self-pay

## 2021-05-03 ENCOUNTER — Encounter: Payer: Self-pay | Admitting: Physical Therapy

## 2021-05-03 ENCOUNTER — Telehealth: Payer: Self-pay | Admitting: Physical Therapy

## 2021-05-03 DIAGNOSIS — M6281 Muscle weakness (generalized): Secondary | ICD-10-CM | POA: Diagnosis present

## 2021-05-03 DIAGNOSIS — R2681 Unsteadiness on feet: Secondary | ICD-10-CM | POA: Diagnosis present

## 2021-05-03 DIAGNOSIS — R2689 Other abnormalities of gait and mobility: Secondary | ICD-10-CM

## 2021-05-03 DIAGNOSIS — R296 Repeated falls: Secondary | ICD-10-CM | POA: Diagnosis present

## 2021-05-03 NOTE — Telephone Encounter (Signed)
FAXED-Provider not in Epic  ? ?Dr. Buck Mam, ?Tyler Lester was evaluated by physical therapy on 05/03/21.  The patient would benefit from an OT and SLP evaluation for PD-related deficits.   ?If you agree, please place an order in [facility] workque in [employer] or fax the order to [phone]. ?Thank you, ?Cruzita Lederer Doryce Mcgregory, PT, DPT ? ? ?[address] ?[address] ?[place], [place]  [address] ?Phone:  [phone] ?Fax:  [phone] ? ?

## 2021-05-03 NOTE — Therapy (Signed)
OUTPATIENT PHYSICAL THERAPY NEURO EVALUATION   Patient Name: Tyler Lester MRN: [id number] DOB:[birth year], 76 y.o., male Today's Date: 05/03/2021  PCP: [facility] [name] PROVIDER: Buck Mam, Audrea Muscat, MD   PT End of Session - 05/03/21 1243     Visit Number 1    Number of Visits 25   plus eval   Date for PT Re-Evaluation 07/26/21    Authorization Type UHC Medicare/ Medicaid of [place]    Progress Note Due on Visit 10    PT Start Time 1105    PT Stop Time 1145    PT Time Calculation (min) 40 min    Equipment Utilized During Treatment Gait belt    Activity Tolerance Patient tolerated treatment well;Patient limited by pain    Behavior During Therapy WFL for tasks assessed/performed             Past Medical History:  Diagnosis Date   Bilateral foot pain    Chronic pain    Diabetes mellitus    type 2, diet controlled now   Foley catheter in place since nov 2018, last changed 1 week ago   Hyperlipidemia    Hypertension    Inguinal hernia    right   Numbness    T/O   Parkinson disease ([place]) 08/26/2015   Followed by [facility]. Last visit 06/03/2015. History of poor compliance with his medication. Referred him back to [facility] in 10/2016. They didn't want to see him back due to poor compliance with medical advise. Last plan from 06/03/2015 still valid   Parkinson's disease ([place])    Pneumonia yrs ago   Shortness of breath    with exertion   Urinary retention    Past Surgical History:  Procedure Laterality Date   CATARACT EXTRACTION W/ INTRAOCULAR LENS IMPLANT Left 06/2012   laser for posterior capsule?   COLON SURGERY     POSTERIOR CERVICAL FUSION/FORAMINOTOMY N/A 11/12/2013   Procedure: Posterior cervical decompression fusion, cervical 3-4, cervical 4-5, cervical 5-6, cervical 6-7 with instrumentation and allograft   (LEVEL 4);  Surgeon: Sinclair Ship, MD;  Location: [facility];  Service: Orthopedics;  Laterality: N/A;  Posterior cervical decompression fusion, cervical 3-4,  cervical 4-5, cervical 5-6, cervical 6-7 with instrumentation and allograft   TONSILLECTOMY     XI ROBOTIC ASSISTED SIMPLE PROSTATECTOMY N/A 03/07/2016   Procedure: XI ROBOTIC ASSISTED SIMPLE PROSTATECTOMY AND UMBILICAL HERNIA REPAIR flexible cystoscopy;  Surgeon: Alexis Frock, MD;  Location[facility];  Service: Urology;  Laterality: N/A;   Patient Active Problem List   Diagnosis Date Noted   Diarrhea 03/21/2021   DNR (do not resuscitate) 03/21/2021   Sepsis ([place]) 02/17/2021   back pain and hip pain 02/17/2021   Frequent falls 02/17/2021   Acute focal neurological deficit 03/01/2020   Ischial pain, right 08/25/2019   Palpitations 11/24/2018   Onychomycosis 08/05/2017   Chronic bilateral low back pain with bilateral sciatica 01/30/2017   Memory change 11/09/2016   Diabetic polyneuropathy associated with type 2 diabetes mellitus ([place]) 04/13/2016   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   Diminished hearing, left 01/20/2016   Type 2 diabetes mellitus with complication ([place]) 70/35/0093   Gait abnormality 03/28/2015   Postlaminectomy syndrome, cervical region 07/23/2014   Parkinson's disease ([place]) 07/23/2014   Back pain with sciatica 05/27/2014   Bilateral leg pain 05/14/2014   Depression 10/15/2013   CKD (chronic kidney disease) stage 3, GFR 30-59 ml/min (HCC) 08/25/2013   Tremor 05/23/2013   Acute renal failure superimposed  on stage 3 chronic kidney disease ([place]) 05/08/2013   Chronic diastolic heart failure ([place]) 05/06/2013   Onychogryposis of toenail 05/14/2012   Essential hypertension 01/05/2011    ONSET DATE: 03/24/2021 (referral)   REFERRING DIAG: G20 (ICD-10-CM) - Parkinson's disease R26.89 (ICD-10-CM) - Other abnormalities of gait and mobility   THERAPY DIAG:  Other abnormalities of gait and mobility  Repeated falls  Unsteadiness on feet  Muscle weakness (generalized)  SUBJECTIVE:                                                                                                                                                                                               SUBJECTIVE STATEMENT: "I need help trying to walk better". Pt walked into clinic w/SPC but reports he uses RW at home. Pt reports falling at least 3-4 times in past 68moand is unable to get up from the floor.  Pt accompanied by:  Caregiver: Felicia   PERTINENT HISTORY: He has been struggling with balance and has had recurrent falls. He uses a RCorporate investment bankerfor stability and it helps some. No dizziness upon standing however he does experience some when bending over. He experiences gait freezing when sitting for prolonged periods and on turning.   Anxiety and depression   Chronic kidney disease   Diabetes mellitus type II, controlled ([place]   PAD (peripheral artery disease) ([place]   Parkinson disease ([place]   PAIN:  Are you having pain? Yes NPRS scale: 8/10 Pain location: Bilateral hips Pain orientation: Bilateral  PAIN TYPE: aching and throbbing Pain description: intermittent  Aggravating factors: Gait, bending  Relieving factors: rest, pain meds   PRECAUTIONS: Fall  WEIGHT BEARING RESTRICTIONS No  FALLS: Has patient fallen in last 6 months? Yes, Number of falls: 3-4   LIVING ENVIRONMENT: Lives with: lives alone, has caregiver 7 days a week, 9 hours per day  Lives in: House/apartment Stairs: Yes; External: 3 steps; none Has ramped entrance  Has following equipment at home: Single point cane, Walker - 2 wheeled, WEnvironmental consultant- 4 wheeled, Wheelchair (power), and Ramped entry  PLOF: Needs assistance with ADLs, Needs assistance with homemaking, Needs assistance with gait, and Needs assistance with transfers  PATIENT GOALS "Learn to walk without using a stick" "start running again"  OBJECTIVE:    COGNITION: Overall cognitive status: Difficult to assess Areas of impairment: Attention, Memory, Following commands, and Safety/judgement  SENSATION: Light touch: Deficits impaired  entire LLE, impaired distally RLE    COORDINATION: UE: hypometric movements of RUE > LUE, dysdiadochokinesia bilaterally  LE: hypometric heel to shin test RLE > LLE  Hypometric toe taps of  RLE > LLE    POSTURE: rounded shoulders, forward head, decreased lumbar lordosis, increased thoracic kyphosis, and flexed trunk   MMT:  MMT Right 05/03/2021 Left 05/03/2021  Hip flexion 4 3+  Hip extension    Hip abduction 4 4-  Hip adduction 3+ 3+  Hip internal rotation    Hip external rotation    Knee flexion 4 4-  Knee extension 4 4-  Ankle dorsiflexion 4 4  Ankle plantarflexion 4 4  Ankle inversion    Ankle eversion    (Blank rows = not tested)  TRANSFERS: Assistive device utilized: Single point cane  Sit to stand: Min A for immediate standing balance, multiple losses of balance posteriorly or anteriorly, occasional shuffle steps to attempt to regain balance  Stand to sit: Min A for eccentric control  Chair to chair: Min A and Mod A  GAIT: Gait pattern: step through pattern, decreased step length- Left, decreased stride length, decreased ankle dorsiflexion- Right, decreased ankle dorsiflexion- Left, antalgic, festinating, lateral hip instability, trunk flexed, poor foot clearance- Right, and poor foot clearance- Left Assistive device utilized: Single point cane Level of assistance: Min A Comments: Pt holds SPC on R side but with bilat Ues at times (inconsistent). Festination w/turns and slapping of LLE w/LUE (dyskinesia?). Increased dyskinesia w/effort (L hemibody > R). Decreased step clearance bilat w/variation of step-to and step-through pattern. Forward lean.   FUNCTIONAL TESTs:    [facility] PT Assessment - 05/03/21 1126       Transfers   Five time sit to stand comments  52.66s   With UE support and min-mod A for anterior LOB correction     Ambulation/Gait   Gait velocity 1.45 ft/s   limited community ambulator, recurrent fall risk     Balance   Balance Assessed Yes       Standardized Balance Assessment   Standardized Balance Assessment Mini-BESTest;Timed Up and Go Test      Mini-BESTest   Sit To Stand Moderate: Comes to stand WITH use of hands on first attempt.    Rise to Toes < 3 s.    Stand on one leg (left) Severe: Unable    Stand on one leg (right) Severe: Unable    Stand on one leg - lowest score 0    Compensatory Stepping Correction - Forward No step, OR would fall if not caught, OR falls spontaneously.   Needs assist from therapist to prevent from falling   Compensatory Stepping Correction - Backward No step, OR would fall if not caught, OR falls spontaneously.    Compensatory Stepping Correction - Left Lateral Severe: Falls, or cannot step    Compensatory Stepping Correction - Right Lateral Severe:  Falls, or cannot step    Stepping Corredtion Lateral - lowest score 0    Stance - Feet together, eyes open, firm surface  Severe: Unable    Stance - Feet together, eyes closed, foam surface  Severe: Unable    Incline - Eyes Closed Severe: Unable    Change in Gait Speed Severe: Unable to achieve significant change in walking speed AND signs of imbalance.    Walk with head turns - Horizontal Severe: performs head turns with imbalance    Walk with pivot turns Severe: Cannot turn with feet close at any speed without imbalance.    Step over obstacles Severe: Unable to step over box OR steps around box    Timed UP & GO with Dual Task Severe: Stops counting while walking OR stops walking  while counting.    Mini-BEST total score 1      Timed Up and Go Test   Normal TUG (seconds) 32.91   SPC and min guard   Cognitive TUG (seconds) 34.28   SPC and min guard, pt did not start counting until 20s into TUG              TODAY'S TREATMENT:  See clinical impression    PATIENT EDUCATION: Education details: POC, goals, outcome measure interpretation, importance of taking Sinemet on a regular schedule and not with protein, bringing in RW to next session due  to SIGNIFICANT fall risk, SLP and OT referrals due to deficits   Person educated: Patient and Caregiver Education method: Explanation Education comprehension: verbalized understanding and needs further education   HOME EXERCISE PROGRAM: To be established next session   ASSESSMENT:  CLINICAL IMPRESSION: Patient is a 76 year old male referred to Neuro OPPT for Parkinson's Disease.   Pt's PMH is significant for: Anxiety and depression, Chronic kidney disease, Diabetes mellitus type II, controlled ([place]), PAD (peripheral artery disease) ([place]). The following deficits were present during the exam: bradykinesia, impaired postural control, impaired timing/coordination of gait, impaired safety awareness, global deconditioning, impaired balance, dyskinesias bilaterally. Based on minibest, TUG, fall history, 5x STS and gait velocity, pt is an incr risk for falls. Deferred MiniBest due to significant impairments and plan to perform Berg next session to further assess balance. Pt would benefit from skilled PT to address these impairments and functional limitations to maximize functional mobility independence   OBJECTIVE IMPAIRMENTS Abnormal gait, decreased activity tolerance, decreased balance, decreased cognition, decreased coordination, decreased endurance, decreased knowledge of condition, decreased knowledge of use of DME, decreased mobility, difficulty walking, decreased ROM, decreased strength, decreased safety awareness, impaired perceived functional ability, impaired sensation, impaired UE functional use, improper body mechanics, postural dysfunction, and pain.   ACTIVITY LIMITATIONS community activity, meal prep, laundry, and medication management.   PERSONAL FACTORS Age, Behavior pattern, Fitness, Past/current experiences, Time since onset of injury/illness/exacerbation, and 1-2 comorbidities: anxiety and PAD  are also affecting patient's functional outcome.    REHAB POTENTIAL: Fair due to time  since onset of PD diagnosis  CLINICAL DECISION MAKING: Evolving/moderate complexity  EVALUATION COMPLEXITY: Moderate   GOALS: Goals reviewed with patient? Yes  SHORT TERM GOALS: ALL DUE 05/31/21  Pt will be independent w/caregiver assist with initial HEP for improved strength, balance, transfers and gait.  Baseline:  Target date: 05/31/2021 Goal status: INITIAL  2.  Pt will improve gait velocity to at least 1.8 ft/s with LRAD (RW vs rollator) and min guard for improved gait efficiency and performance   Baseline: 1.35f/s w/SPC and min guard on 05/03/21 Target date: 05/31/2021 Goal status: INITIAL  3.  Assess Berg and write LTG Baseline:  Target date: 05/31/2021 Goal status: INITIAL  4.  Pt will perform 5 repetitions of sit <>stand w/BUE support and S* for improved safety with transfers  Baseline: min A in 52.66s on 05/03/21 Target date: 05/31/2021 Goal status: INITIAL  5.  Pt and caregiver will verbalize and demonstrate techniques for fall prevention in the home  Baseline:  Target date: 05/31/2021 Goal status: INITIAL   LONG TERM GOALS: ALL DUE 06/28/21  Pt will improve Berg score to X for decreased fall risk  Baseline: To be assessed  Target date: 06/28/2021 Goal status: INITIAL  2.  Pt will improve normal TUG to less than or equal to 25 seconds w/LRAD and S* for improved functional mobility and  decreased fall risk.  Baseline: 32.91s w/SPC and min guard Target date: 06/28/2021 Goal status: INITIAL  3.  Pt will improve gait velocity to at least 2.1 ft/s w/LRAD and S* for improved gait efficiency and performance   Baseline: 1.45 ft/s w/SPC and min guard on 05/03/21 Target date: 06/28/2021 Goal status: INITIAL  4.  Pt will recover posterior and anterior balance in push-and-release test in 2-3 steps for improved balance recovery  Baseline: multiple steps required - would have fallen  Target date: 06/28/2021 Goal status: INITIAL  5.  Pt will improve 5 x STS to less than or equal to  40 seconds w/BUE support and S* to demonstrate improved functional strength and transfer efficiency.   Baseline: 52.44s w/min guard and BUE support on 05/03/21 Target date: 06/28/2021 Goal status: INITIAL   PLAN: PT FREQUENCY: 2x/week  PT DURATION: 12 weeks  PLANNED INTERVENTIONS: Therapeutic exercises, Therapeutic activity, Neuromuscular re-education, Balance training, Gait training, Patient/Family education, Joint mobilization, and DME instructions  PLAN FOR NEXT SESSION: BERG, HEP, taking amatadine? Gait training w/RW, sit <>stands, stepping strategy    Cruzita Lederer Raquelle Pietro, PT, DPT 05/03/2021, 2:53 PM

## 2021-05-10 ENCOUNTER — Ambulatory Visit: Payer: Medicare Other | Admitting: Physical Therapy

## 2021-05-10 ENCOUNTER — Other Ambulatory Visit: Payer: Self-pay

## 2021-05-10 DIAGNOSIS — M6281 Muscle weakness (generalized): Secondary | ICD-10-CM

## 2021-05-10 DIAGNOSIS — R2689 Other abnormalities of gait and mobility: Secondary | ICD-10-CM

## 2021-05-10 DIAGNOSIS — R2681 Unsteadiness on feet: Secondary | ICD-10-CM

## 2021-05-10 DIAGNOSIS — R296 Repeated falls: Secondary | ICD-10-CM

## 2021-05-10 NOTE — Therapy (Signed)
?OUTPATIENT PHYSICAL THERAPY TREATMENT NOTE ? ? ?Patient Name: Tyler Lester ?MRN: [id number] ?DOB:10/14/1945, 76 y.o., male ?Today's Date: 05/10/2021 ? ?[facility]P: [facility] ?REFERRING PROVIDER: Thenganatt, Audrea Muscat, MD  ? ? PT End of Session - 05/10/21 1419   ? ? Visit Number 2   ? Number of Visits 25   plus eval  ? Date for PT Re-Evaluation 07/26/21   ? Authorization Type UHC Medicare/ Medicaid of [place]   ? Progress Note Due on Visit 10   ? PT Start Time 1233   ? PT Stop Time 1315   ? PT Time Calculation (min) 42 min   ? Equipment Utilized During Treatment Gait belt   ? Activity Tolerance Patient tolerated treatment well;Patient limited by pain   ? Behavior During Therapy [facility] for tasks assessed/performed   ? ?  ?  ? ?  ? ? ?Past Medical History:  ?Diagnosis Date  ? Bilateral foot pain   ? Chronic pain   ? Diabetes mellitus   ? type 2, diet controlled now  ? Foley catheter in place since nov 2018, last changed 1 week ago  ? Hyperlipidemia   ? Hypertension   ? Inguinal hernia   ? right  ? Numbness   ? T/O  ? Parkinson disease ([place]) 08/26/2015  ? Followed by [facility]. Last visit 06/03/2015. History of poor compliance with his medication. Referred him back to [facility] in 10/2016. They didn't want to see him back due to poor compliance with medical advise. Last plan from 06/03/2015 still valid  ? Parkinson's disease ([place])   ? Pneumonia yrs ago  ? Shortness of breath   ? with exertion  ? Urinary retention   ? ?Past Surgical History:  ?Procedure Laterality Date  ? CATARACT EXTRACTION W/ INTRAOCULAR LENS IMPLANT Left 06/2012  ? laser for posterior capsule?  ? COLON SURGERY    ? POSTERIOR CERVICAL FUSION/FORAMINOTOMY N/A 11/12/2013  ? Procedure: Posterior cervical decompression fusion, cervical 3-4, cervical 4-5, cervical 5-6, cervical 6-7 with instrumentation and allograft   (LEVEL 4);  Surgeon: Sinclair Ship, MD;  Location: [facility];  Service: Orthopedics;  Laterality: N/A;  Posterior cervical decompression fusion, cervical 3-4,  cervical 4-5, cervical 5-6, cervical 6-7 with instrumentation and allograft  ? TONSILLECTOMY    ? XI ROBOTIC ASSISTED SIMPLE PROSTATECTOMY N/A 03/07/2016  ? Procedure: XI ROBOTIC ASSISTED SIMPLE PROSTATECTOMY AND UMBILICAL HERNIA REPAIR flexible cystoscopy;  Surgeon: Alexis Frock, MD;  [facility];  Service: Urology;  Laterality: N/A;  ? ?Patient Active Problem List  ? Diagnosis Date Noted  ? Diarrhea 03/21/2021  ? DNR (do not resuscitate) 03/21/2021  ? Sepsis ([place]) 02/17/2021  ? back pain and hip pain 02/17/2021  ? Frequent falls 02/17/2021  ? Acute focal neurological deficit 03/01/2020  ? Ischial pain, right 08/25/2019  ? Palpitations 11/24/2018  ? Onychomycosis 08/05/2017  ? Chronic bilateral low back pain with bilateral sciatica 01/30/2017  ? Memory change 11/09/2016  ? Diabetic polyneuropathy associated with type 2 diabetes mellitus ([place]) 04/13/2016  ? 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 Diminished hearing, left 01/20/2016  ? Type 2 diabetes mellitus with complication ([place]) 12/45/8099  ? Gait abnormality 03/28/2015  ? Postlaminectomy syndrome, cervical region 07/23/2014  ? Parkinson's disease ([place]) 07/23/2014  ? Back pain with sciatica 05/27/2014  ? Bilateral leg pain 05/14/2014  ? Depression 10/15/2013  ? CKD (chronic kidney disease) stage 3, GFR 30-59 ml/min (HCC) 08/25/2013  ? Tremor 05/23/2013  ? Acute renal failure  superimposed on stage 3 chronic kidney disease ([place]) 05/08/2013  ? Chronic diastolic heart failure ([place]) 05/06/2013  ? Onychogryposis of toenail 05/14/2012  ? Essential hypertension 01/05/2011  ? ? ?REFERRING DIAG: G20 (ICD-10-CM) - Parkinson's disease R26.89 (ICD-10-CM) - Other abnormalities of gait and mobility  ? ?THERAPY DIAG:  ?Other abnormalities of gait and mobility ? ?Repeated falls ? ?Unsteadiness on feet ? ?Muscle weakness (generalized) ? ?PERTINENT HISTORY:  Anxiety and depression  ? Chronic kidney disease  ? Diabetes mellitus type II, controlled ([place])  ? PAD  (peripheral artery disease) ([place])  ? Parkinson disease ([place])  ? ?PRECAUTIONS: Fall ? ?SUBJECTIVE: Pt reports he has "knots" all over his neck and chest, states it is because he has been sleeping on box springs in his hospital bed rather than a mattress because he does not have a mattress. Pt reports he has reached out to his physician, social worker and insurance regarding the mattress months ago and has not heard back. Pt also reports he has not been eating much and has no appetite, is losing weight rapidly.  ? ?PAIN:  ?Are you having pain? No ? ? ?OBJECTIVE:  ?  ?  WEIGHT (LBS): 143 (taken on 05/10/21) ?  ?  ?POSTURE: rounded shoulders, forward head, decreased lumbar lordosis, increased thoracic kyphosis, and flexed trunk  ? ?  ?TODAY'S TREATMENT:  ? ? [facility] PT Assessment - 05/10/21 1250   ? ?  ? Balance  ? Balance Assessed Yes   ?  ? Standardized Balance Assessment  ? Standardized Balance Assessment Berg Balance Test   ?  ? Berg Balance Test  ? Sit to Stand Able to stand  independently using hands   ? Standing Unsupported Able to stand 2 minutes with supervision   ? Sitting with Back Unsupported but Feet Supported on Floor or Stool Able to sit safely and securely 2 minutes   ? Stand to Sit Controls descent by using hands   ? Transfers Able to transfer with verbal cueing and /or supervision   ? Standing Unsupported with Eyes Closed Able to stand 10 seconds with supervision   ? Standing Unsupported with Feet Together Able to place feet together independently and stand for 1 minute with supervision   ? From Standing, Reach Forward with Outstretched Arm Loses balance while trying/requires external support   ? From Standing Position, Pick up Object from Floor Unable to try/needs assist to keep balance   ? From Standing Position, Turn to Look Behind Over each Shoulder Needs assist to keep from losing balance and falling   ? Turn 360 Degrees Needs close supervision or verbal cueing   ? Standing Unsupported, Alternately  Place Feet on Step/Stool Needs assistance to keep from falling or unable to try   ? Standing Unsupported, One Foot in [employer] balance while stepping or standing   ? Standing on One Leg Tries to lift leg/unable to hold 3 seconds but remains standing independently   ? Total Score 23   ? Berg comment: High fall risk   ? ?  ?  ? ?  ? ? ?GAIT: ?Gait pattern: decreased step length- Right, decreased step length- Left, decreased stride length, decreased ankle dorsiflexion- Right, decreased ankle dorsiflexion- Left, Right foot flat, Left foot flat, shuffling, festinating, trunk flexed, narrow BOS, poor foot clearance- Right, and poor foot clearance- Left ?Distance walked: clinic distances  ?Assistive device utilized: Environmental consultant - 2 wheeled ?Level of assistance: CGA ?Comments: Pt ambulated into clinic w/RW and demonstrated gait  pattern listed above. Pt fatigues very quickly and demonstrates significant forward lean that is not corrected by internal or external cues. ? ?  ?PATIENT EDUCATION: ?Education details: Contacting Dr. Buck Mam regarding hospital bed mattress, obtaining OT and SLP referral and rapid weight loss. Result interpretation of Berg, importance of using RW at home.  ?Person educated: Patient and Caregiver ?Education method: Explanation ?Education comprehension: verbalized understanding and needs further education ?  ?  ?HOME EXERCISE PROGRAM: ?To be established next session  ?  ?ASSESSMENT: ?  ?CLINICAL IMPRESSION: ?Emphasis of skilled PT session on assessing balance via Merrilee Jansky and patient education. Pt provided Dr. Normajean Baxter contact info and encouraged to contact her (see pt edu above). Pt reports continued difficulty to eat due to no appetite and has lost 25lbs since February 18, 2021. Inspected the "knots" that the patient was feeling on the back of his neck and informed him that he is feeling his spinous processes due to his weight loss. Pt achieved a 23/56 on Berg and required several seated rest breaks  throughout due to global deconditioning and fatigue. Pt aware of his high fall risk and verbalized understanding to solely use the RW at home. Continue POC.  ?  ?  ?OBJECTIVE IMPAIRMENTS Abnormal gait, d

## 2021-05-12 ENCOUNTER — Ambulatory Visit: Payer: Medicare Other | Admitting: Physical Therapy

## 2021-05-17 ENCOUNTER — Ambulatory Visit: Payer: Medicare Other | Admitting: Physical Therapy

## 2021-05-19 ENCOUNTER — Observation Stay (HOSPITAL_COMMUNITY)
Admission: EM | Admit: 2021-05-19 | Discharge: 2021-05-20 | Disposition: A | Discharge status: Home / Self Care | Payer: Medicare Other | Attending: Internal Medicine | Admitting: Internal Medicine

## 2021-05-19 ENCOUNTER — Other Ambulatory Visit: Payer: Self-pay

## 2021-05-19 ENCOUNTER — Ambulatory Visit: Payer: Medicare Other | Admitting: Physical Therapy

## 2021-05-19 DIAGNOSIS — Z79899 Other long term (current) drug therapy: Secondary | ICD-10-CM | POA: Insufficient documentation

## 2021-05-19 DIAGNOSIS — G8929 Other chronic pain: Secondary | ICD-10-CM | POA: Diagnosis present

## 2021-05-19 DIAGNOSIS — N183 Chronic kidney disease, stage 3 unspecified: Secondary | ICD-10-CM | POA: Diagnosis not present

## 2021-05-19 DIAGNOSIS — N401 Enlarged prostate with lower urinary tract symptoms: Secondary | ICD-10-CM | POA: Diagnosis not present

## 2021-05-19 DIAGNOSIS — N138 Other obstructive and reflux uropathy: Secondary | ICD-10-CM

## 2021-05-19 DIAGNOSIS — R2689 Other abnormalities of gait and mobility: Secondary | ICD-10-CM

## 2021-05-19 DIAGNOSIS — N400 Enlarged prostate without lower urinary tract symptoms: Secondary | ICD-10-CM | POA: Diagnosis present

## 2021-05-19 DIAGNOSIS — I10 Essential (primary) hypertension: Secondary | ICD-10-CM

## 2021-05-19 DIAGNOSIS — G20 Parkinson's disease: Secondary | ICD-10-CM | POA: Insufficient documentation

## 2021-05-19 DIAGNOSIS — I5032 Chronic diastolic (congestive) heart failure: Secondary | ICD-10-CM | POA: Diagnosis not present

## 2021-05-19 DIAGNOSIS — F32A Depression, unspecified: Secondary | ICD-10-CM | POA: Diagnosis present

## 2021-05-19 DIAGNOSIS — M5442 Lumbago with sciatica, left side: Secondary | ICD-10-CM | POA: Diagnosis not present

## 2021-05-19 DIAGNOSIS — I130 Hypertensive heart and chronic kidney disease with heart failure and stage 1 through stage 4 chronic kidney disease, or unspecified chronic kidney disease: Secondary | ICD-10-CM | POA: Insufficient documentation

## 2021-05-19 DIAGNOSIS — E1122 Type 2 diabetes mellitus with diabetic chronic kidney disease: Secondary | ICD-10-CM | POA: Insufficient documentation

## 2021-05-19 DIAGNOSIS — M5441 Lumbago with sciatica, right side: Secondary | ICD-10-CM | POA: Insufficient documentation

## 2021-05-19 DIAGNOSIS — R634 Abnormal weight loss: Secondary | ICD-10-CM

## 2021-05-19 DIAGNOSIS — M6281 Muscle weakness (generalized): Secondary | ICD-10-CM

## 2021-05-19 DIAGNOSIS — E118 Type 2 diabetes mellitus with unspecified complications: Secondary | ICD-10-CM

## 2021-05-19 DIAGNOSIS — N179 Acute kidney failure, unspecified: Principal | ICD-10-CM

## 2021-05-19 DIAGNOSIS — R296 Repeated falls: Secondary | ICD-10-CM

## 2021-05-19 DIAGNOSIS — R627 Adult failure to thrive: Secondary | ICD-10-CM | POA: Diagnosis not present

## 2021-05-19 DIAGNOSIS — M549 Dorsalgia, unspecified: Secondary | ICD-10-CM

## 2021-05-19 DIAGNOSIS — R531 Weakness: Secondary | ICD-10-CM | POA: Diagnosis present

## 2021-05-19 DIAGNOSIS — R2681 Unsteadiness on feet: Secondary | ICD-10-CM

## 2021-05-19 DIAGNOSIS — M5430 Sciatica, unspecified side: Secondary | ICD-10-CM

## 2021-05-19 LAB — I-STAT CHEM 8, ED
BUN: 45 mg/dL — ABNORMAL HIGH (ref 8–23)
Calcium, Ion: 1.35 mmol/L (ref 1.15–1.40)
Chloride: 103 mmol/L (ref 98–111)
Creatinine, Ser: 2.3 mg/dL — ABNORMAL HIGH (ref 0.61–1.24)
Glucose, Bld: 77 mg/dL (ref 70–99)
HCT: 49 % (ref 39.0–52.0)
Hemoglobin: 16.7 g/dL (ref 13.0–17.0)
Potassium: 4.8 mmol/L (ref 3.5–5.1)
Sodium: 140 mmol/L (ref 135–145)
TCO2: 28 mmol/L (ref 22–32)

## 2021-05-19 LAB — HEPATIC FUNCTION PANEL
ALT: <5 U/L (ref 0–44)
AST: 16 U/L (ref 15–41)
Albumin: 4 g/dL (ref 3.5–5.0)
Alkaline Phosphatase: 42 U/L (ref 38–126)
Bilirubin, Direct: <0.1 mg/dL (ref 0.0–0.2)
Total Bilirubin: 0.8 mg/dL (ref 0.3–1.2)
Total Protein: 7.1 g/dL (ref 6.5–8.1)

## 2021-05-19 LAB — CBC
HCT: 46.4 % (ref 39.0–52.0)
Hemoglobin: 14.6 g/dL (ref 13.0–17.0)
MCH: 27.9 pg (ref 26.0–34.0)
MCHC: 31.5 g/dL (ref 30.0–36.0)
MCV: 88.5 fL (ref 80.0–100.0)
Platelets: 192 10*3/uL (ref 150–400)
RBC: 5.24 MIL/uL (ref 4.22–5.81)
RDW: 15 % (ref 11.5–15.5)
WBC: 6.1 10*3/uL (ref 4.0–10.5)
nRBC: 0 % (ref 0.0–0.2)

## 2021-05-19 LAB — PHOSPHORUS: Phosphorus: 3.9 mg/dL (ref 2.5–4.6)

## 2021-05-19 LAB — BASIC METABOLIC PANEL
Anion gap: 7 (ref 5–15)
BUN: 42 mg/dL — ABNORMAL HIGH (ref 8–23)
CO2: 25 mmol/L (ref 22–32)
Calcium: 9.6 mg/dL (ref 8.9–10.3)
Chloride: 105 mmol/L (ref 98–111)
Creatinine, Ser: 2.31 mg/dL — ABNORMAL HIGH (ref 0.61–1.24)
GFR, Estimated: 29 mL/min — ABNORMAL LOW (ref >60)
Glucose, Bld: 81 mg/dL (ref 70–99)
Potassium: 4.6 mmol/L (ref 3.5–5.1)
Sodium: 137 mmol/L (ref 135–145)

## 2021-05-19 LAB — MAGNESIUM: Magnesium: 1.7 mg/dL (ref 1.7–2.4)

## 2021-05-19 LAB — GLUCOSE, CAPILLARY: Glucose-Capillary: 78 mg/dL (ref 70–99)

## 2021-05-19 MED ORDER — PANTOPRAZOLE SODIUM 40 MG PO TBEC
40.0000 mg | DELAYED_RELEASE_TABLET | Freq: Every day | ORAL | Status: DC
  Administered 2021-05-20: 40 mg via ORAL
  Filled 2021-05-19: qty 1

## 2021-05-19 MED ORDER — SODIUM CHLORIDE 0.9% FLUSH
3.0000 mL | Freq: Two times a day (BID) | INTRAVENOUS | Status: DC
  Administered 2021-05-19 – 2021-05-20 (×2): 3 mL via INTRAVENOUS

## 2021-05-19 MED ORDER — DONEPEZIL HCL 10 MG PO TABS
10.0000 mg | ORAL_TABLET | Freq: Every morning | ORAL | Status: DC
  Administered 2021-05-20: 10 mg via ORAL
  Filled 2021-05-19: qty 1

## 2021-05-19 MED ORDER — DICLOFENAC SODIUM 1 % EX GEL: 2.0000 g | Freq: Every day | CUTANEOUS | Status: DC | PRN

## 2021-05-19 MED ORDER — SODIUM CHLORIDE 0.9 % IV SOLN
INTRAVENOUS | Status: DC
  Administered 2021-05-19: 125 mL/h via INTRAVENOUS

## 2021-05-19 MED ORDER — CARBIDOPA-LEVODOPA ER 50-200 MG PO TBCR
2.0000 | EXTENDED_RELEASE_TABLET | Freq: Two times a day (BID) | ORAL | Status: DC
  Administered 2021-05-20: 2 via ORAL
  Filled 2021-05-19 (×3): qty 2

## 2021-05-19 MED ORDER — HYDROXYZINE HCL 25 MG PO TABS
50.0000 mg | ORAL_TABLET | Freq: Every evening | ORAL | Status: DC | PRN
  Administered 2021-05-19: 50 mg via ORAL
  Filled 2021-05-19: qty 2

## 2021-05-19 MED ORDER — CARBIDOPA-LEVODOPA 25-250 MG PO TABS
1.0000 | ORAL_TABLET | Freq: Two times a day (BID) | ORAL | Status: DC
  Administered 2021-05-20 (×2): 1 via ORAL
  Filled 2021-05-19 (×2): qty 1

## 2021-05-19 MED ORDER — ENOXAPARIN SODIUM 30 MG/0.3ML IJ SOSY
30.0000 mg | PREFILLED_SYRINGE | INTRAMUSCULAR | Status: DC
  Administered 2021-05-19: 30 mg via SUBCUTANEOUS
  Filled 2021-05-19: qty 0.3

## 2021-05-19 MED ORDER — ACETAMINOPHEN 325 MG PO TABS: 650.0000 mg | ORAL_TABLET | Freq: Four times a day (QID) | ORAL | Status: DC | PRN

## 2021-05-19 MED ORDER — ACETAMINOPHEN 650 MG RE SUPP: 650.0000 mg | Freq: Four times a day (QID) | RECTAL | Status: DC | PRN

## 2021-05-19 MED ORDER — AMANTADINE HCL 100 MG PO CAPS
100.0000 mg | ORAL_CAPSULE | Freq: Two times a day (BID) | ORAL | Status: DC
  Administered 2021-05-20 (×2): 100 mg via ORAL
  Filled 2021-05-19 (×3): qty 1

## 2021-05-19 MED ORDER — INSULIN ASPART 100 UNIT/ML IJ SOLN: 0.0000 [IU] | Freq: Three times a day (TID) | INTRAMUSCULAR | Status: DC

## 2021-05-19 MED ORDER — MELATONIN 5 MG PO TABS
5.0000 mg | ORAL_TABLET | Freq: Every evening | ORAL | Status: DC | PRN
  Administered 2021-05-19: 5 mg via ORAL
  Filled 2021-05-19: qty 1

## 2021-05-19 NOTE — ED Provider Notes (Signed)
?[facility] ?Provider Note ? ? ?CSN: [id number] ?Arrival date & time: 05/19/21  1259 ? ?  ? ?History ? ?No chief complaint on file. ? ? ?Tyler Lester is a 76 y.o. male with a past medical history of parkinsons, memory issues, hypertension, gout, and diabetes with neuropathy.  He presents with a chief complaint of weakness.  Patient may be an unreliable historian because he states that he does not think he has diabetes or neuropathy.  Patient reports that he has had a significant weight loss and loss of appetite for the past 1 month.  He reports he thinks he is lost approximately 30 pack 5 pounds in 1 month.  He is however currently asking to eat something and does state that he went to [place] yesterday.  He also complains that his legs are too weak to hold him up and he has been using a wheelchair at home.  He is able to use a walker at times.  He states that this is new for the past month.  He also tells me he has left-sided weakness which is chronic and thought to be due to his Parkinson's disease.  He denies abdominal pain, nausea, vomiting, diarrhea, chest pain, cough.  He denies a history of smoking and alcohol abuse.  He denies black or tarry stools, feelings of near syncope. ? ?HPI ? ?  ? ?Home Medications ?Prior to Admission medications   ?Medication Sig Start Date End Date Taking? Authorizing Provider  ?allopurinol (ZYLOPRIM) 300 MG tablet Take 300 mg by mouth every morning. 02/21/20   [provider]  ?amantadine (SYMMETREL) 100 MG capsule Take 100 mg by mouth 2 (two) times daily. 09/29/20   [provider]  ?amLODipine (NORVASC) 5 MG tablet Take 1 tablet (5 mg total) by mouth daily. 03/04/20 03/21/21  Kayleen Memos, DO  ?carbidopa-levodopa (SINEMET CR) 50-200 MG tablet Take 2 tablets by mouth See admin instructions. 2 tablets at 5pm and bedtime (9-10pm) 11/29/20   [provider]  ?carbidopa-levodopa (SINEMET IR) 25-250 MG tablet Take 1  tablet by mouth See admin instructions. Take one tablet by mouth at 9am and 1pm    [provider]  ?cyclobenzaprine (FLEXERIL) 5 MG tablet Take 5 mg by mouth 3 (three) times daily as needed for muscle spasms. 02/05/20   [provider]  ?diclofenac Sodium (VOLTAREN) 1 % GEL Apply 2-4 g topically daily as needed (leg and hip pain). 10/04/20   [provider]  ?donepezil (ARICEPT) 10 MG tablet Take 1 tablet (10 mg total) by mouth every morning. 03/28/21   Reed, Tiffany L, DO  ?gabapentin (NEURONTIN) 600 MG tablet Take 600 mg by mouth daily as needed (nerve pain). 10/04/20   [provider]  ?hydrOXYzine (ATARAX) 50 MG tablet Take 50 mg by mouth at bedtime as needed (sleep). 02/04/21   [provider]  ?melatonin 5 MG TABS Take 5 mg by mouth at bedtime as needed (sleep).    [provider]  ?naloxone (NARCAN) nasal spray 4 mg/0.1 mL Place 4 mg into the nose once as needed (opioid overdose). 10/04/20   [provider]  ?omeprazole (PRILOSEC) 40 MG capsule Take 80 mg by mouth every morning. 09/10/20   [provider]  ?oxyCODONE-acetaminophen (PERCOCET) 10-325 MG tablet Take 1 tablet by mouth 2 (two) times daily as needed for pain. 02/04/21   [provider]  ?Vitamin D, Ergocalciferol, (DRISDOL) 1.25 MG (50000 UNIT) CAPS capsule Take 50,000 Units  by mouth every Sunday. 02/04/20   [provider]  ?   ? ?Allergies    ?Poison ivy treatments   ? ?Review of Systems   ?Review of Systems ? ?Physical Exam ?Updated Vital Signs ?BP 106/70   Pulse 78   Temp (!) 97.5 ?F (36.4 ?C) (Oral)   Resp 18   SpO2 99%  ?Physical Exam ?Vitals and nursing note reviewed.  ?Constitutional:   ?   General: He is not in acute distress. ?   Appearance: He is underweight. He is not diaphoretic.  ?HENT:  ?   Head: Normocephalic and atraumatic.  ?Eyes:  ?   General: No scleral icterus. ?   Conjunctiva/sclera: Conjunctivae normal.  ?Cardiovascular:  ?   Rate and  Rhythm: Normal rate and regular rhythm.  ?   Heart sounds: Normal heart sounds.  ?Pulmonary:  ?   Effort: Pulmonary effort is normal. No respiratory distress.  ?   Breath sounds: Normal breath sounds.  ?Abdominal:  ?   Palpations: Abdomen is soft.  ?   Tenderness: There is no abdominal tenderness.  ?Musculoskeletal:  ?   Cervical back: Normal range of motion and neck supple.  ?Skin: ?   General: Skin is warm and dry.  ?Neurological:  ?   Mental Status: He is alert. He is confused.  ?   GCS: GCS eye subscore is 4. GCS verbal subscore is 5. GCS motor subscore is 6.  ?   Cranial Nerves: Cranial nerves 2-12 are intact.  ?   Motor: Weakness (Left upper and lower extremity) present.  ?Psychiatric:     ?   Behavior: Behavior normal.  ? ? ?ED Results / Procedures / Treatments   ?Labs ?(all labs ordered are listed, but only abnormal results are displayed) ?Labs Reviewed  ?BASIC METABOLIC PANEL - Abnormal; Notable for the following components:  ?    Result Value  ? BUN 42 (*)   ? Creatinine, Ser 2.31 (*)   ? GFR, Estimated 29 (*)   ? All other components within normal limits  ?I-STAT CHEM 8, ED - Abnormal; Notable for the following components:  ? BUN 45 (*)   ? Creatinine, Ser 2.30 (*)   ? All other components within normal limits  ?RESP PANEL BY RT-PCR (FLU A&B, COVID) ARPGX2  ?CBC  ?MAGNESIUM  ?PHOSPHORUS  ?HEPATIC FUNCTION PANEL  ?URINALYSIS, ROUTINE W REFLEX MICROSCOPIC  ?I-STAT CHEM 8, ED  ?CBG MONITORING, ED  ? ? ?EKG ?EKG Interpretation ? ?Date/Time:  Friday May 19 2021 12:54:44 EDT ?Ventricular Rate:  87 ?PR Interval:  138 ?QRS Duration: 84 ?QT Interval:  370 ?QTC Calculation: 445 ?R Axis:   -41 ?Text Interpretation: Normal sinus rhythm Left axis deviation Pulmonary disease pattern Abnormal ECG When compared with ECG of 17-Feb-2021 12:11, PREVIOUS ECG IS PRESENT since last tracing no significant change Confirmed by Malvin Johns [phone]) on 05/20/2021 11:19:39 AM ? ?Radiology ?No results  found. ? ?Procedures ?Procedures  ? ? ?Medications Ordered in ED ?Medications - No data to display ? ?ED Course/ Medical Decision Making/ A&P ?  ?                        ?Medical Decision Making ?Risk ?Decision regarding hospitalization. ? ?Patient here wit  c/o of weakness. The differential diagnosis of weakness includes but is not limited to neurologic causes (GBS, myasthenia gravis, CVA, MS, ALS, transverse myelitis, spinal cord injury, CVA, botulism, ) and other causes: ACS, Arrhythmia,  syncope, orthostatic hypotension, sepsis, hypoglycemia, electrolyte disturbance, hypothyroidism, respiratory failure, symptomatic anemia, dehydration, heat injury, polypharmacy, malignancy. ? ?I ordered and reviewed labs.  Patient has mildly elevated creatinine.  Appears to be somewhat baseline for the patient. ?Prior function panel within normal limits, CBC without abnormality. ?EKG shows sinus rhythm at a rate of 87. ? ?Case discussed with Dr. Trilby Drummer of the hospitalist service.  Will admit for failure to thrive, weakness.  Patient given fluids and fluid here in the emergency department ? ?I considered CT imaging to rule out malignancy as cause of weight loss however patient had a CT imaging the past in January of his abdomen and pelvis.  He also has very poor renal function and I want to avoid giving contrast agents at this time. ? ? ?  ? ? ? ?Final Clinical Impression(s) / ED Diagnoses ?Final diagnoses:  ?AKI (acute kidney injury) ([place])  ?Failure to thrive in adult  ?Unexplained weight loss  ? ? ?Rx / DC Orders ?ED Discharge Orders   ? ? None  ? ?  ? ? ?  ?Margarita Mail, PA-C ?05/20/21 1936 ? ?  ?[name], MD ?05/21/21 1546 ? ?

## 2021-05-19 NOTE — ED Provider Triage Note (Signed)
Emergency Medicine Provider Triage Evaluation Note ? ?Tyler Lester , a 76 y.o. male  was evaluated in triage.  Pt complains of generalized weakness, weight loss, and decreased appetite. Hx of parkinsons. He was seen by his doctor today. He has apparently lost 30 lb in 2-3 weeks. Reports not eating as much as normal. He does not have n/v/d. No chest pain, shortness of breath, focal weakness or numbness. Denies change in parkinsonism symptoms. Dr. Durene Cal him here for evaluation.  ? ?Review of Systems  ?Positive: Decreased appetite, weakness, weight loss ?Negative:  ? ?Physical Exam  ?BP (!) 114/96 (BP Location: Left Arm)   Pulse 89   Temp (!) 97.5 ?F (36.4 ?C) (Oral)   Resp 16   SpO2 99%  ?Gen:   Awake, no distress   ?Resp:  Normal effort  ?MSK:   Moves extremities without difficulty  ?Other:  Parkinson motor features. No speech deficits. Appears thin.  ? ?Medical Decision Making  ?Medically screening exam initiated at 1:29 PM.  Appropriate orders placed.  Tyler Lester was informed that the remainder of the evaluation will be completed by another provider, this initial triage assessment does not replace that evaluation, and the importance of remaining in the ED until their evaluation is complete. ? ? ?  ?Adolphus Birchwood, PA-C ?05/19/21 1332 ? ?

## 2021-05-19 NOTE — ED Triage Notes (Signed)
Pt here from home d/t weakness, loss of appetite and losing weight unintentionally X2 weeks. History of Parkinson. Pt went to therapy and was told to come to ED. Denies fever, N,V. Recently tested for Covid/flu and was negative.  ?

## 2021-05-19 NOTE — H&P (Addendum)
History and Physical   Tyler Lester WGN:[id number] DOB: 01-28-1946 DOA: 05/19/2021  [facility]   Patient coming from: Home  Chief Complaint: Weakness, decreased appetite, weight loss  HPI: Tyler Lester is a 76 y.o. male with medical history significant of hypertension, Parkinson disease, diastolic CHF, CKD 3, depression, chronic pain, diabetes, BPH presenting with ongoing weakness decreased appetite and weight loss.  Of note, patient appears as though he may be a poor historian he does have history of Parkinson disease with memory loss and a degree of baseline confusion.  He states that he has chronic left-sided weakness and has had recent generalized weakness for the past 2 to 4 weeks in the setting of 2 to 4 weeks of significant decreased appetite and decreased p.o. intake.  EDP discussed this with patient's friend who states that he does get episodes where he has a very poor appetite has significant weight loss, may have lost up to 30 pounds this time.  Does state that these episodes wax and wane and he feels like he is just now starting to get his appetite back, he was able to eat some [employer] yesterday.  Patient [place] fevers, chills, chest pain, shortness of breath, abdominal pain, constipation, diarrhea, nausea, vomiting.  ED Course: Vital signs in the ED significant for temperature of 97.5, blood pressure in the 100s to 110s systolic.  Lab work included CMP with BUN 42 and creatinine 2.31 from baseline 1.5.  Magnesium normal, phosphate normal.  CBC within normal limits.  Respiratory panel flu COVID pending.  Urinalysis pending.  Review of Systems: As per HPI otherwise all other systems reviewed and are negative.  Past Medical History:  Diagnosis Date   Bilateral foot pain    Chronic pain    Diabetes mellitus    type 2, diet controlled now   Foley catheter in place since nov 2018, last changed 1 week ago   Hyperlipidemia    Hypertension    Inguinal hernia     right   Numbness    T/O   Parkinson disease (HCC) 08/26/2015   Followed by [facility]. Last visit 06/03/2015. History of poor compliance with his medication. Referred him back to [facility] in 10/2016. They didn't want to see him back due to poor compliance with medical advise. Last plan from 06/03/2015 still valid   Parkinson's disease (HCC)    Pneumonia yrs ago   Shortness of breath    with exertion   Urinary retention     Past Surgical History:  Procedure Laterality Date   CATARACT EXTRACTION W/ INTRAOCULAR LENS IMPLANT Left 06/2012   laser for posterior capsule?   COLON SURGERY     POSTERIOR CERVICAL FUSION/FORAMINOTOMY N/A 11/12/2013   Procedure: Posterior cervical decompression fusion, cervical 3-4, cervical 4-5, cervical 5-6, cervical 6-7 with instrumentation and allograft   (LEVEL 4);  Surgeon: Emilee Hero, MD;  Location: [facility] OR;  Service: Orthopedics;  Laterality: N/A;  Posterior cervical decompression fusion, cervical 3-4, cervical 4-5, cervical 5-6, cervical 6-7 with instrumentation and allograft   TONSILLECTOMY     XI ROBOTIC ASSISTED SIMPLE PROSTATECTOMY N/A 03/07/2016   Procedure: XI ROBOTIC ASSISTED SIMPLE PROSTATECTOMY AND UMBILICAL HERNIA REPAIR flexible cystoscopy;  Surgeon: Sebastian Ache, MD;  Location: [facility]S;  Service: Urology;  Laterality: N/A;    Social History  reports that he has never smoked. He has never used smokeless tobacco. He reports current alcohol use. He reports that he does not use drugs.  Allergies  Allergen  Reactions   Poison Ivy Treatments Hives    Family History  Problem Relation Age of Onset   Cancer - Other Other    Diabetes Other    Hypertension Other   Reviewed on admission  Prior to Admission medications   Medication Sig Start Date End Date Taking? Authorizing Provider  allopurinol (ZYLOPRIM) 300 MG tablet Take 300 mg by mouth every morning. 02/21/20   [provider]  amantadine (SYMMETREL) 100 MG capsule Take 100 mg by mouth 2 (two)  times daily. 09/29/20   [provider]  amLODipine (NORVASC) 5 MG tablet Take 1 tablet (5 mg total) by mouth daily. 03/04/20 03/21/21  Darlin Drop, DO  carbidopa-levodopa (SINEMET CR) 50-200 MG tablet Take 2 tablets by mouth See admin instructions. 2 tablets at 5pm and bedtime (9-10pm) 11/29/20   [provider]  carbidopa-levodopa (SINEMET IR) 25-250 MG tablet Take 1 tablet by mouth See admin instructions. Take one tablet by mouth at 9am and 1pm    [provider]  cyclobenzaprine (FLEXERIL) 5 MG tablet Take 5 mg by mouth 3 (three) times daily as needed for muscle spasms. 02/05/20   [provider]  diclofenac Sodium (VOLTAREN) 1 % GEL Apply 2-4 g topically daily as needed (leg and hip pain). 10/04/20   [provider]  donepezil (ARICEPT) 10 MG tablet Take 1 tablet (10 mg total) by mouth every morning. 03/28/21   Reed, Tiffany L, DO  gabapentin (NEURONTIN) 600 MG tablet Take 600 mg by mouth daily as needed (nerve pain). 10/04/20   [provider]  hydrOXYzine (ATARAX) 50 MG tablet Take 50 mg by mouth at bedtime as needed (sleep). 02/04/21   [provider]  melatonin 5 MG TABS Take 5 mg by mouth at bedtime as needed (sleep).    [provider]  naloxone [facility]) nasal spray 4 mg/0.1 mL Place 4 mg into the nose once as needed (opioid overdose). 10/04/20   [provider]  omeprazole (PRILOSEC) 40 MG capsule Take 80 mg by mouth every morning. 09/10/20   [provider]  oxyCODONE-acetaminophen (PERCOCET) 10-325 MG tablet Take 1 tablet by mouth 2 (two) times daily as needed for pain. 02/04/21   [provider]  Vitamin D, Ergocalciferol, (DRISDOL) 1.25 MG (50000 UNIT) CAPS capsule Take 50,000 Units by mouth every Sunday. 02/04/20   [provider]    Physical Exam: Vitals:   05/19/21 1304 05/19/21 1540  BP: (!) 114/96 106/70  Pulse: 89 78  Resp: 16 18  Temp: (!) 97.5 F (36.4 C)   TempSrc: Oral    SpO2: 99% 99%    Physical Exam Constitutional:      General: He is not in acute distress.    Comments: Tired appearing  HENT:     Head: Normocephalic and atraumatic.     Mouth/Throat:     Mouth: Mucous membranes are moist.     Pharynx: Oropharynx is clear.  Eyes:     Extraocular Movements: Extraocular movements intact.     Pupils: Pupils are equal, round, and reactive to light.  Cardiovascular:     Rate and Rhythm: Normal rate and regular rhythm.     Pulses: Normal pulses.     Heart sounds: Normal heart sounds.  Pulmonary:     Effort: Pulmonary effort is normal. No respiratory distress.     Breath sounds: Normal breath sounds.  Abdominal:     General: Bowel sounds are normal. There is no distension.  Palpations: Abdomen is soft.     Tenderness: There is no abdominal tenderness.  Musculoskeletal:        General: No swelling or deformity.  Skin:    General: Skin is warm and dry.  Neurological:     General: No focal deficit present.     Motor: Weakness (Genrealized, Somewhat greater at L upper and lower extremity than R) present.    Labs on Admission: I have personally reviewed following labs and imaging studies  CBC: Recent Labs  Lab 05/19/21 1328 05/19/21 1357  WBC 6.1  --   HGB 14.6 16.7  HCT 46.4 49.0  MCV 88.5  --   PLT 192  --     Basic Metabolic Panel: Recent Labs  Lab 05/19/21 1328 05/19/21 1357  NA 137 140  K 4.6 4.8  CL 105 103  CO2 25  --   GLUCOSE 81 77  BUN 42* 45*  CREATININE 2.31* 2.30*  CALCIUM 9.6  --   MG 1.7  --   PHOS 3.9  --     GFR: CrCl cannot be calculated (Unknown ideal weight.).  Liver Function Tests: Recent Labs  Lab 05/19/21 1328  AST 16  ALT <5  ALKPHOS 42  BILITOT 0.8  PROT 7.1  ALBUMIN 4.0    Urine analysis:    Component Value Date/Time   COLORURINE AMBER (A) 02/17/2021 1455   APPEARANCEUR CLOUDY (A) 02/17/2021 1455   LABSPEC 1.020 02/17/2021 1455   PHURINE 5.5 02/17/2021 1455   GLUCOSEU 100 (A)  02/17/2021 1455   HGBUR LARGE (A) 02/17/2021 1455   BILIRUBINUR NEGATIVE 02/17/2021 1455   BILIRUBINUR NEGATIVE 05/27/2014 1020   KETONESUR NEGATIVE 02/17/2021 1455   PROTEINUR 30 (A) 02/17/2021 1455   UROBILINOGEN 1.0 10/29/2014 0042   NITRITE NEGATIVE 02/17/2021 1455   LEUKOCYTESUR TRACE (A) 02/17/2021 1455    Radiological Exams on Admission: No results found.  EKG: Independently reviewed.  Normal sinus rhythm at 87 bpm.  Baseline artifact.  Read as pulmonary disease pattern with possible S1 and T3, somewhat difficult to interpret due to artifact.  Similar to previous.  Assessment/Plan Principal Problem:   AKI (acute kidney injury) (HCC) Active Problems:   Essential hypertension   Chronic diastolic heart failure (HCC)   Depression   Back pain with sciatica   Parkinson's disease (HCC)   Type 2 diabetes mellitus with complication (HCC)   BPH with Refracotry Urinary Retention   Chronic bilateral low back pain with bilateral sciatica   Generalized weakness   Acute renal failure on CKD 3 > Presented with weakness and recent weight loss in the setting of decreased appetite. > Noted to have AKI with creatinine elevated 2.31 for baseline of 1.5.  Likely secondary to above. > Magnesium and phosphorus as well as calcium and potassium and sodium normal in the ED. > Does have history of BPH but is urinating okay. - IV fluids overnight - Trend renal function electrolytes - Avoid nephrotoxic agents  Generalized weakness > History of chronic weakness on the left side attributed to his Parkinson's disease. > New generalized weakness in the setting of decreased appetite and decreased p.o. intake.  Reportedly has had significant weight loss up to 30 pounds. > We will get patient reweigh to compare to prior weight of 74.5 kg a few months ago. > Lower suspicion for malignancy at this time as he has reason for weight loss with decreased p.o. intake.  Did have recent CT abdomen pelvis without  contrast in January  without any acute findings or masses.  Did have a chest x-ray in December without any acute abnormality as well. - PT/OT eval and treat - Encourage patient to eat and drink - IV fluids as above  Confusion Parkinson's disease > Confusion appears to be chronic for him.  Known history of Parkinson's disease which could be contributing to his memory changes. - Continue home amantadine, donepezil - Continue home carbidopa-levodopa  Chronic pain - Continue Percocet if able to confirm  Diabetes - SSI  Hypertension - Hold home amlodipine in the setting of low normal blood pressure  DVT prophylaxis: Lovenox Code Status:   DNR Family Communication:    Disposition Plan:   Patient is from:  Home  Anticipated DC to:  Home  Anticipated DC date:  1 to 3 days  Anticipated DC barriers: None on admission, EDP did speak with contact who is a friend. Consults called:  None Admission status:  Observation, MedSurg  Severity of Illness: The appropriate patient status for this patient is OBSERVATION. Observation status is judged to be reasonable and necessary in order to provide the required intensity of service to ensure the patient's safety. The patient's presenting symptoms, physical exam findings, and initial radiographic and laboratory data in the context of their medical condition is felt to place them at decreased risk for further clinical deterioration. Furthermore, it is anticipated that the patient will be medically stable for discharge from the hospital within 2 midnights of admission.    Synetta Fail MD [facility]  How to contact the [facility] Attending or Consulting provider 7A - 7P or covering provider during after hours 7P -7A, for this patient?   Check the care team in [facility] and look for a) attending/consulting [facility] provider listed and b) the [facility] team listed Log into [email or web address] and use [facility]'s universal password to access. If you do not have the  password, please contact the hospital operator. Locate the [facility] provider you are looking for under [facility] and page to a number that you can be directly reached. If you still have difficulty reaching the provider, please page the [facility] (Director on Call) for the Hospitalists listed on amion for assistance.  05/19/2021, 8:28 PM

## 2021-05-19 NOTE — Therapy (Signed)
?OUTPATIENT PHYSICAL THERAPY TREATMENT NOTE/ Arrived No Charge ? ? ?Patient Name: Tyler Lester ?MRN: [id number] ?DOB:12/24/1945, 76 y.o., male ?Today's Date: 05/19/2021 ? ?[facility]P: [facility] ?REFERRING PROVIDER: Thenganatt, Audrea Muscat, MD  ? ? PT End of Session - 05/19/21 1250   ? ? Visit Number 2   ? Number of Visits 25   plus eval  ? Date for PT Re-Evaluation 07/26/21   ? Authorization Type U[employer] Medicare/ Medicaid of [place]   ? Progress Note Due on Visit 10   ? PT Start Time 1230   ? PT Stop Time 4098   ? PT Time Calculation (min) 15 min   ? Activity Tolerance Treatment limited secondary to medical complications (Comment)   Pt sent to ED  ? ?  ?  ? ?  ? ? ? ?Past Medical History:  ?Diagnosis Date  ? Bilateral foot pain   ? Chronic pain   ? Diabetes mellitus   ? type 2, diet controlled now  ? Foley catheter in place since nov 2018, last changed 1 week ago  ? Hyperlipidemia   ? Hypertension   ? Inguinal hernia   ? right  ? Numbness   ? T/O  ? Parkinson disease ([place]) 08/26/2015  ? Followed by [facility]. Last visit 06/03/2015. History of poor compliance with his medication. Referred him back to [facility] in 10/2016. They didn't want to see him back due to poor compliance with medical advise. Last plan from 06/03/2015 still valid  ? Parkinson's disease ([place])   ? Pneumonia yrs ago  ? Shortness of breath   ? with exertion  ? Urinary retention   ? ?Past Surgical History:  ?Procedure Laterality Date  ? CATARACT EXTRACTION W/ INTRAOCULAR LENS IMPLANT Left 06/2012  ? laser for posterior capsule?  ? COLON SURGERY    ? POSTERIOR CERVICAL FUSION/FORAMINOTOMY N/A 11/12/2013  ? Procedure: Posterior cervical decompression fusion, cervical 3-4, cervical 4-5, cervical 5-6, cervical 6-7 with instrumentation and allograft   (LEVEL 4);  Surgeon: Sinclair Ship, MD;  Location: [facility];  Service: Orthopedics;  Laterality: N/A;  Posterior cervical decompression fusion, cervical 3-4, cervical 4-5, cervical 5-6, cervical 6-7 with instrumentation and  allograft  ? TONSILLECTOMY    ? XI ROBOTIC ASSISTED SIMPLE PROSTATECTOMY N/A 03/07/2016  ? Procedure: XI ROBOTIC ASSISTED SIMPLE PROSTATECTOMY AND UMBILICAL HERNIA REPAIR flexible cystoscopy;  Surgeon: Alexis Frock, MD;  Location: [facility];  Service: Urology;  Laterality: N/A;  ? ?Patient Active Problem List  ? Diagnosis Date Noted  ? Diarrhea 03/21/2021  ? DNR (do not resuscitate) 03/21/2021  ? Sepsis ([place]) 02/17/2021  ? back pain and hip pain 02/17/2021  ? Frequent falls 02/17/2021  ? Acute focal neurological deficit 03/01/2020  ? Ischial pain, right 08/25/2019  ? Palpitations 11/24/2018  ? Onychomycosis 08/05/2017  ? Chronic bilateral low back pain with bilateral sciatica 01/30/2017  ? Memory change 11/09/2016  ? Diabetic polyneuropathy associated with type 2 diabetes mellitus ([place]) 04/13/2016  ? 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 Diminished hearing, left 01/20/2016  ? Type 2 diabetes mellitus with complication ([place]) 11/91/4782  ? Gait abnormality 03/28/2015  ? Postlaminectomy syndrome, cervical region 07/23/2014  ? Parkinson's disease ([place]) 07/23/2014  ? Back pain with sciatica 05/27/2014  ? Bilateral leg pain 05/14/2014  ? Depression 10/15/2013  ? CKD (chronic kidney disease) stage 3, GFR 30-59 ml/min (HCC) 08/25/2013  ? Tremor 05/23/2013  ? Acute renal failure superimposed on stage 3 chronic kidney disease ([place]) 05/08/2013  ?  Chronic diastolic heart failure ([place]) 05/06/2013  ? Onychogryposis of toenail 05/14/2012  ? Essential hypertension 01/05/2011  ? ? ?REFERRING DIAG: G20 (ICD-10-CM) - Parkinson's disease R26.89 (ICD-10-CM) - Other abnormalities of gait and mobility  ? ?THERAPY DIAG:  ?Other abnormalities of gait and mobility ? ?Repeated falls ? ?Unsteadiness on feet ? ?Muscle weakness (generalized) ? ?PERTINENT HISTORY:  Anxiety and depression  ? Chronic kidney disease  ? Diabetes mellitus type II, controlled ([place])  ? PAD (peripheral artery disease) ([place])  ? Parkinson disease ([place])   ? ?PRECAUTIONS: Fall ? ?Pt arrived for scheduled PT appointment, reported he had not eaten well in the past week and notably sickly in appearance. Pt having significant dyskinesia and verbalized significant weakness in BLEs, had taken Sinemet hour prior w/protein shake. Advised pt go to ED due to recent significant weight loss and pt report of not feeling well.   ? ? ?Cruzita Lederer Willett Lefeber, PT, DPT ?05/19/2021, 12:51 PM ? ?  ? ?

## 2021-05-20 ENCOUNTER — Encounter (HOSPITAL_COMMUNITY): Payer: Self-pay | Admitting: Internal Medicine

## 2021-05-20 DIAGNOSIS — N179 Acute kidney failure, unspecified: Secondary | ICD-10-CM | POA: Diagnosis not present

## 2021-05-20 LAB — URINALYSIS, ROUTINE W REFLEX MICROSCOPIC
Bilirubin Urine: NEGATIVE
Glucose, UA: NEGATIVE mg/dL
Hgb urine dipstick: NEGATIVE
Ketones, ur: 5 mg/dL — AB
Leukocytes,Ua: NEGATIVE
Nitrite: NEGATIVE
Protein, ur: NEGATIVE mg/dL
Specific Gravity, Urine: 1.012 (ref 1.005–1.030)
pH: 5 (ref 5.0–8.0)

## 2021-05-20 LAB — CBC
HCT: 45.4 % (ref 39.0–52.0)
Hemoglobin: 14.6 g/dL (ref 13.0–17.0)
MCH: 27.7 pg (ref 26.0–34.0)
MCHC: 32.2 g/dL (ref 30.0–36.0)
MCV: 86.1 fL (ref 80.0–100.0)
Platelets: 172 10*3/uL (ref 150–400)
RBC: 5.27 MIL/uL (ref 4.22–5.81)
RDW: 14.8 % (ref 11.5–15.5)
WBC: 5.7 10*3/uL (ref 4.0–10.5)
nRBC: 0 % (ref 0.0–0.2)

## 2021-05-20 LAB — COMPREHENSIVE METABOLIC PANEL
ALT: <5 U/L (ref 0–44)
AST: 13 U/L — ABNORMAL LOW (ref 15–41)
Albumin: 3.5 g/dL (ref 3.5–5.0)
Alkaline Phosphatase: 39 U/L (ref 38–126)
Anion gap: 7 (ref 5–15)
BUN: 37 mg/dL — ABNORMAL HIGH (ref 8–23)
CO2: 23 mmol/L (ref 22–32)
Calcium: 9.5 mg/dL (ref 8.9–10.3)
Chloride: 108 mmol/L (ref 98–111)
Creatinine, Ser: 1.91 mg/dL — ABNORMAL HIGH (ref 0.61–1.24)
GFR, Estimated: 37 mL/min — ABNORMAL LOW (ref >60)
Glucose, Bld: 118 mg/dL — ABNORMAL HIGH (ref 70–99)
Potassium: 3.9 mmol/L (ref 3.5–5.1)
Sodium: 138 mmol/L (ref 135–145)
Total Bilirubin: 0.5 mg/dL (ref 0.3–1.2)
Total Protein: 6.5 g/dL (ref 6.5–8.1)

## 2021-05-20 LAB — GLUCOSE, CAPILLARY
Glucose-Capillary: 112 mg/dL — ABNORMAL HIGH (ref 70–99)
Glucose-Capillary: 85 mg/dL (ref 70–99)

## 2021-05-20 NOTE — Progress Notes (Signed)
Tyler Lester to be discharged Home per MD order. Discussed prescriptions and follow up appointments with the patient. Medication list explained in detail. Patient verbalized understanding. ? ?Skin clean, dry and intact without evidence of skin break down, no evidence of skin tears noted. IV catheter discontinued intact. Site without signs and symptoms of complications. Dressing and pressure applied. Pt denies pain at the site currently. No complaints noted. ? ?Patient free of lines, drains, and wounds.  ? ?An After Visit Summary (AVS) was printed and given to the patient. ?Patient escorted via wheelchair, and discharged home with caregiver via private auto. ? ?Amaryllis Dyke, RN  ?

## 2021-05-20 NOTE — Plan of Care (Signed)
NEW ADMISSION NOTE ?New Admission Note:  ?  ?Arrival Method: Stretcher ?Mental Orientation:  Alert and oriented x 4 ?Telemetry:# N/A ?Assessment: Complete ?Skin: intact ?IV: Rt fore arm ?Pain: None ?Tubes: Condom Cath ?Safety Measures: Reviewed with patient ?Admission: Complete ?[facility] Orientation: Patient has been oriented to the room, unit and staff.  ?Family: ?  ?Orders have been reviewed and implemented. Will continue to monitor the patient. Call light has been placed within reach and bed alarm has been activated.  ?

## 2021-05-20 NOTE — Evaluation (Signed)
Physical Therapy Evaluation ?Patient Details ?Name: Tyler Lester ?MRN: [id number]  ?DOB: April 23, 1945 ?Today's Date: 05/20/2021 ? ?History of Present Illness ? Tyler Lester is a 76 y.o. male with medical history significant of PD, HTN, CKD stage III, L8XQ, chronic diastolic CHF, BPH with urinary retention admitted after mechanical fall with recent weakness and worsening tremor after treated for UTI 12/19 in ED with fall and weakness.  Found to be septic.  ?Clinical Impression ? Pt is close to baseline functioning and should be safe at home with all the assist that PCA and family can provide. There are no further acute PT needs.  Will sign off at this time. ?   ?   ? ?Recommendations for follow up therapy are one component of a multi-disciplinary discharge planning process, led by the attending physician.  Recommendations may be updated based on patient status, additional functional criteria and insurance authorization. ? ?Follow Up Recommendations Outpatient PT ? ?  ?Assistance Recommended at Discharge Frequent or constant Supervision/Assistance  ?Patient can return home with the following ? A little help with walking and/or transfers;A little help with bathing/dressing/bathroom;Assistance with cooking/housework;Assist for transportation;Help with stairs or ramp for entrance ? ?  ?Equipment Recommendations None recommended by PT  ?Recommendations for Other Services ?    ?  ?Functional Status Assessment    ? ?  ?Precautions / Restrictions    ? ?  ? ?Mobility ? Bed Mobility ?Overal bed mobility: Modified Independent ?  ?  ?  ?  ?  ?  ?  ?  ? ?Transfers ?Overall transfer level: Needs assistance ?  ?Transfers: Sit to/from Stand ?Sit to Stand: Supervision ?  ?  ?  ?  ?  ?  ?  ? ?Ambulation/Gait ?Ambulation/Gait assistance: Min guard ?Gait Distance (Feet): 100 Feet ?Assistive device: Rolling walker (2 wheels) ?Gait Pattern/deviations: Step-through pattern ?Gait velocity: moderate and not fully controlled ?Gait velocity  interpretation: 1.31 - 2.62 ft/sec, indicative of limited community ambulator ?  ?General Gait Details: mildly unsteady with "run away" gait more than festinating.  Picks up the RW alot which adds to the instability, but no LOB, no "lock up" during direction changes or backing up. ? ?Stairs ?  ?  ?  ?  ?  ? ?Wheelchair Mobility ?  ? ?Modified Rankin (Stroke Patients Only) ?  ? ?  ? ?Balance Overall balance assessment: Mild deficits observed, not formally tested, Needs assistance ?Sitting-balance support: No upper extremity supported, Feet supported ?Sitting balance-Leahy Scale: Good ?  ?  ?  ?Standing balance-Leahy Scale: Fair (to poor) ?Standing balance comment: initial posterior bias that improved mildly as pt up. ?  ?  ?  ?  ?  ?  ?  ?  ?  ?  ?  ?   ? ? ? ?Pertinent Vitals/Pain Pain Assessment ?Pain Assessment: No/denies pain  ? ? ?Home Living Family/patient expects to be discharged to:: Private residence ?Living Arrangements: Alone ?Available Help at Discharge: Personal care attendant;Family;Other (Comment) (available most of the day.  pt states he is often by himself a couple of hours.) ?Type of Home: House ?Home Access: Ramped entrance ?  ?  ?  ?Home Layout: One level ?Home Equipment: Conservation officer, nature (2 wheels);Cane - single point ?   ?  ?Prior Function Prior Level of Function : Needs assist ?  ?  ?  ?  ?  ?  ?Mobility Comments: has an aide daily and brother comes at times to check on him ?  ?  ? ? ?  Hand Dominance  ? Dominant Hand: Right ? ?  ?Extremity/Trunk Assessment  ? Upper Extremity Assessment ?Upper Extremity Assessment: Overall WFL for tasks assessed ?  ? ?Lower Extremity Assessment ?Lower Extremity Assessment: Generalized weakness;Overall [facility] for tasks assessed ?  ? ?   ?Communication  ? Communication: No difficulties  ?Cognition Arousal/Alertness: Awake/alert ?Behavior During Therapy: [facility] for tasks assessed/performed ?Overall Cognitive Status:  (likely baseline mentation) ?  ?  ?  ?  ?  ?  ?  ?  ?  ?   ?  ?  ?  ?  ?  ?  ?  ?  ?  ? ?  ?General Comments   ? ?  ?Exercises    ? ?Assessment/Plan  ?  ?PT Assessment Patient needs continued PT services  ?PT Problem List Decreased strength;Decreased activity tolerance;Decreased balance;Decreased mobility;Decreased coordination ? ?   ?  ?PT Treatment Interventions     ? ?PT Goals (Current goals can be found in the Care Plan section)  ?Acute Rehab PT Goals ?Patient Stated Goal: home soon ?PT Goal Formulation: All assessment and education complete, DC therapy ? ?  ?Frequency   ?  ? ? ?Co-evaluation   ?  ?  ?  ?  ? ? ?  ?AM-PAC PT "6 Clicks" Mobility  ?Outcome Measure Help needed turning from your back to your side while in a flat bed without using bedrails?: A Little ?Help needed moving from lying on your back to sitting on the side of a flat bed without using bedrails?: A Little ?Help needed moving to and from a bed to a chair (including a wheelchair)?: A Little ?Help needed standing up from a chair using your arms (e.g., wheelchair or bedside chair)?: A Little ?Help needed to walk in hospital room?: A Little ?Help needed climbing 3-5 steps with a railing? : A Little ?6 Click Score: 18 ? ?  ?End of Session   ?Activity Tolerance: Patient tolerated treatment well ?Patient left: in bed;with call bell/phone within reach ?Nurse Communication: Mobility status ?PT Visit Diagnosis: Other abnormalities of gait and mobility (R26.89) ?  ? ?Time: 0263-7858 ?PT Time Calculation (min) (ACUTE ONLY): 28 min ? ? ?Charges:   PT Evaluation ?$PT Eval Low Complexity: 1 Low ?PT Treatments ?$Gait Training: 8-22 mins ?  ?   ? ? ?05/20/2021 ? ?Ginger Carne., PT ?Acute Rehabilitation Services ?[phone]  (pager) ?[phone]  (office) ? ?Tessie Fass Kellis Topete ?05/20/2021, 10:51 AM ? ?

## 2021-05-20 NOTE — TOC Transition Note (Signed)
Transition of Care (TOC) - CM/SW Discharge Note ? ? ?Patient Details  ?Name: Tyler Lester ?MRN: [id number] ?Date of Birth: 09/13/1945 ? ?Transition of Care (TOC) CM/SW Contact:  ?Bartholomew Crews, RN ?Phone Number: [phone] ?05/20/2021, 1:45 PM ? ? ?Clinical Narrative:    ? ?Patient to transition home. PT recommendations for OP Rehab - noted in chart review that patient already active for outpatient services.  ? ?Final next level of care: OP Rehab ?Barriers to Discharge: No Barriers Identified ? ? ?Patient Goals and CMS Choice ?  ?  ?  ? ?Discharge Placement ?  ?           ?  ?  ?  ?  ? ?Discharge Plan and Services ?  ?  ?           ?DME Arranged: N/A ?DME Agency: NA ?  ?  ?  ?HH Arranged: NA ?[place] Agency: NA ?  ?  ?  ? ?Social Determinants of Health (SDOH) Interventions ?  ? ? ?Readmission Risk Interventions ? ?  03/23/2021  ?  3:15 PM  ?Readmission Risk Prevention Plan  ?Transportation Screening Complete  ?PCP or Specialist Appt within 3-5 Days Complete  ?[place] or Home Care Consult Complete  ?Social Work Consult for [place] Planning/Counseling Complete  ?Palliative Care Screening Complete  ?Medication Review Press photographer) Complete  ? ? ? ? ? ?

## 2021-05-20 NOTE — Discharge Summary (Signed)
? ? ? ? ?Physician Discharge Summary  ?MARWIN PRIMMER KPT:[id number] DOB: 1945-06-18 DOA: 05/19/2021 ? ?PCP: [facility] ? ?Admit date: 05/19/2021 ?Discharge date: 05/20/2021 ? ?Admitted From: home ?Discharge disposition: home ? ? ?Recommendations for Outpatient Follow-Up:  ? ?Continued outpatient work up for FTT ?Encourage PO intake ?Outpatient PT ? ? ?Discharge Diagnosis:  ? ?Principal Problem: ?  AKI (acute kidney injury) ([place]) ?Active Problems: ?  Essential hypertension ?  Chronic diastolic heart failure ([place]) ?  Depression ?  Back pain with sciatica ?  Parkinson's disease ([place]) ?  Type 2 diabetes mellitus with complication (HCC) ?  BPH with Refracotry Urinary Retention ?  Chronic bilateral low back pain with bilateral sciatica ?  Generalized weakness ? ? ? ?Discharge Condition: Improved. ? ?Diet recommendation:  Regular. ? ?Wound care: None. ? ? ? ?History of Present Illness:  ? ? EDU ON is a 76 y.o. male with medical history significant of hypertension, Parkinson disease, diastolic CHF, CKD 3, depression, chronic pain, diabetes, BPH presenting with ongoing weakness decreased appetite and weight loss. ? ?Of note, patient appears as though he may be a poor historian he does have history of Parkinson disease with memory loss and a degree of baseline confusion.  He states that he has chronic left-sided weakness and has had recent generalized weakness for the past 2 to 4 weeks in the setting of 2 to 4 weeks of significant decreased appetite and decreased p.o. intake.  EDP discussed this with patient's friend who states that he does get episodes where he has a very poor appetite has significant weight loss, may have lost up to 30 pounds this time.  Does state that these episodes wax and wane and he feels like he is just now starting to get his appetite back, he was able to eat some [employer] yesterday. ?  ?Patient denies fevers, chills, chest pain, shortness of breath, abdominal pain, constipation,  diarrhea, nausea, vomiting. ? ? ?Hospital Course by Problem:  ? ?Acute renal failure on CKD 3 ?> Presented with weakness and recent weight loss in the setting of decreased appetite. ?> Noted to have AKI with creatinine elevated 2.31 for baseline of 1.5.  Likely secondary to above. ?> Magnesium and phosphorus as well as calcium and potassium and sodium normal in the ED. ?> Does have history of BPH but is urinating okay. ?- IV fluids overnight ?-improved ?-eating well and stable to go home with continued outpatient follow up ?  ?Generalized weakness ?> History of chronic weakness on the left side attributed to his Parkinson's disease. ?> New generalized weakness in the setting of decreased appetite and decreased p.o. intake.  Reportedly has had significant weight loss up to 30 pounds. ?> We will get patient reweigh to compare to prior weight of 74.5 kg a few months ago. ?> Lower suspicion for malignancy at this time as he has reason for weight loss with decreased p.o. intake.  Did have recent CT abdomen pelvis without contrast in January without any acute findings or masses.  Did have a chest x-ray in December without any acute abnormality as well. ?- PT/OT eval and treat ?- Encourage patient to eat and drink ?- did well with PT- at his baseline ? ? ?Parkinson's disease ?> Confusion appears to be chronic for him.  Known history of Parkinson's disease which could be contributing to his memory changes. ?- Continue home amantadine, donepezil ?- Continue home carbidopa-levodopa ?-consider outpatient palliative care ? ? ? ? ? ? ?  Medical Consultants:  ? ? ? ? ?Discharge Exam:  ? ?Vitals:  ? 05/20/21 0259 05/20/21 0903  ?BP: 121/76 134/74  ?Pulse: 79 76  ?Resp: 18 18  ?Temp: 98.2 ?F (36.8 ?C) 98 ?F (36.7 ?C)  ?SpO2: 99% 98%  ? ?Vitals:  ? 05/19/21 2259 05/20/21 0110 05/20/21 0259 05/20/21 9924  ?BP: (!) 171/94  121/76 134/74  ?Pulse: 90  79 76  ?Resp: 18  18 18   ?Temp: 98.3 ?F (36.8 ?C)  98.2 ?F (36.8 ?C) 98 ?F (36.7 ?C)   ?TempSrc: Oral  Oral   ?SpO2: 98%  99% 98%  ?Weight: 61 kg     ?Height:  5' 9"  (1.753 m)    ? ? ?General exam: Appears calm and comfortable.  ? ?The results of significant diagnostics from this hospitalization (including imaging, microbiology, ancillary and laboratory) are listed below for reference.   ? ? ?Procedures and Diagnostic Studies:  ? ?No results found. ? ? ?Labs:  ? ?Basic Metabolic Panel: ?Recent Labs  ?Lab 05/19/21 ?1328 05/19/21 ?1357 05/20/21 ?0305  ?NA 137 140 138  ?K 4.6 4.8 3.9  ?CL 105 103 108  ?CO2 25  --  23  ?GLUCOSE 81 77 118*  ?BUN 42* 45* 37*  ?CREATININE 2.31* 2.30* 1.91*  ?CALCIUM 9.6  --  9.5  ?MG 1.7  --   --   ?PHOS 3.9  --   --   ? ?GFR ?Estimated Creatinine Clearance: 29.7 mL/min (A) (by C-G formula based on SCr of 1.91 mg/dL (H)). ?Liver Function Tests: ?Recent Labs  ?Lab 05/19/21 ?1328 05/20/21 ?0305  ?AST 16 13*  ?ALT <5 <5  ?ALKPHOS 42 39  ?BILITOT 0.8 0.5  ?PROT 7.1 6.5  ?ALBUMIN 4.0 3.5  ? ?No results for input(s): LIPASE, AMYLASE in the last 168 hours. ?No results for input(s): AMMONIA in the last 168 hours. ?Coagulation profile ?No results for input(s): INR, PROTIME in the last 168 hours. ? ?CBC: ?Recent Labs  ?Lab 05/19/21 ?1328 05/19/21 ?1357 05/20/21 ?0305  ?WBC 6.1  --  5.7  ?HGB 14.6 16.7 14.6  ?HCT 46.4 49.0 45.4  ?MCV 88.5  --  86.1  ?PLT 192  --  172  ? ?Cardiac Enzymes: ?No results for input(s): CKTOTAL, CKMB, CKMBINDEX, TROPONINI in the last 168 hours. ?BNP: ?Invalid input(s): POCBNP ?CBG: ?Recent Labs  ?Lab 05/19/21 ?2312 05/20/21 ?0810  ?GLUCAP 78 112*  ? ?D-Dimer ?No results for input(s): DDIMER in the last 72 hours. ?Hgb A1c ?No results for input(s): HGBA1C in the last 72 hours. ?Lipid Profile ?No results for input(s): CHOL, HDL, LDLCALC, TRIG, CHOLHDL, LDLDIRECT in the last 72 hours. ?Thyroid function studies ?No results for input(s): TSH, T4TOTAL, T3FREE, THYROIDAB in the last 72 hours. ? ?Invalid input(s): FREET3 ?Anemia work up ?No results for input(s):  VITAMINB12, FOLATE, FERRITIN, TIBC, IRON, RETICCTPCT in the last 72 hours. ?Microbiology ?No results found for this or any previous visit (from the past 240 hour(s)). ? ? ?Discharge Instructions:  ? ?Discharge Instructions   ? ? Diet general   Complete by: As directed ?  ? Increase activity slowly   Complete by: As directed ?  ? ?  ? ?Allergies as of 05/20/2021   ? ?   Reactions  ? [place] Treatments Hives  ? ?  ? ?  ?Medication List  ?  ? ?STOP taking these medications   ? ?amLODipine 5 MG tablet ?Commonly known as: NORVASC ?  ?cyclobenzaprine 5 MG tablet ?Commonly known as: FLEXERIL ?  ?  gabapentin 600 MG tablet ?Commonly known as: NEURONTIN ?  ?melatonin 5 MG Tabs ?  ? ?  ? ?TAKE these medications   ? ?allopurinol 300 MG tablet ?Commonly known as: ZYLOPRIM ?Take 300 mg by mouth every morning. ?  ?amantadine 100 MG capsule ?Commonly known as: SYMMETREL ?Take 100 mg by mouth 2 (two) times daily. ?  ?carbidopa-levodopa 25-250 MG tablet ?Commonly known as: SINEMET IR ?Take 1 tablet by mouth See admin instructions. Take one tablet by mouth at 9am and 1pm ?  ?carbidopa-levodopa 50-200 MG tablet ?Commonly known as: SINEMET CR ?Take 2 tablets by mouth See admin instructions. 2 tablets at 5pm and bedtime (9-10pm) ?  ?diclofenac Sodium 1 % Gel ?Commonly known as: VOLTAREN ?Apply 2-4 g topically daily as needed (leg and hip pain). ?  ?donepezil 10 MG tablet ?Commonly known as: ARICEPT ?Take 1 tablet (10 mg total) by mouth every morning. ?  ?hydrOXYzine 50 MG tablet ?Commonly known as: ATARAX ?Take 50 mg by mouth at bedtime as needed (sleep). ?  ?naloxone 4 MG/0.1ML Liqd nasal spray kit ?Commonly known as: NARCAN ?Place 4 mg into the nose once as needed (opioid overdose). ?  ?omeprazole 40 MG capsule ?Commonly known as: PRILOSEC ?Take 80 mg by mouth every morning. ?  ?oxyCODONE-acetaminophen 10-325 MG tablet ?Commonly known as: PERCOCET ?Take 1 tablet by mouth 2 (two) times daily as needed for pain. ?  ?Vitamin D  (Ergocalciferol) 1.25 MG (50000 UNIT) Caps capsule ?Commonly known as: DRISDOL ?Take 50,000 Units by mouth every Sunday. ?  ? ?  ? ? ? ? ?Time coordinating discharge: 25 ? ?Signed: ? ?Geradine Girt DO  ?[employer]

## 2021-05-24 ENCOUNTER — Encounter: Payer: Self-pay | Admitting: Physical Therapy

## 2021-05-24 ENCOUNTER — Ambulatory Visit: Payer: Medicare Other | Admitting: Physical Therapy

## 2021-05-24 DIAGNOSIS — R2689 Other abnormalities of gait and mobility: Secondary | ICD-10-CM | POA: Diagnosis not present

## 2021-05-24 DIAGNOSIS — R2681 Unsteadiness on feet: Secondary | ICD-10-CM

## 2021-05-24 DIAGNOSIS — M6281 Muscle weakness (generalized): Secondary | ICD-10-CM

## 2021-05-24 DIAGNOSIS — R296 Repeated falls: Secondary | ICD-10-CM

## 2021-05-24 NOTE — Patient Instructions (Addendum)
Sitting With Rotation ? ? ? ?Sit on edge of seat with one hand on opposite knee, other on chair. Keep knees parallel. Pull with hand on knee and, if needed, push with hand on chair to rotate trunk to that side. Hold __3_ seconds. ?Repeat __5_ times per session. Do _2__ sessions per day. ? ?Copyright ? V[employer]. All rights reserved.  ? ? ?Access Code: [email or web address] ?URL: [email or web address] ?Date: 05/24/2021 ?Prepared by: Janann August ? ?Exercises ?- Sit to Stand with Armchair  - 2 x daily - 5 x weekly - 2 sets - 5 reps ?- Seated March  - 2 x daily - 5 x weekly - 2 sets - 10 reps ? ?Seated PWR Up/Rock ? ?

## 2021-05-24 NOTE — Therapy (Signed)
?OUTPATIENT PHYSICAL THERAPY TREATMENT NOTE ? ? ?Patient Name: Tyler Lester ?MRN: [id number] ?DOB:[birth year], 76 y.o., male ?Today's Date: 05/24/2021 ? ?PCP: [facility] ?REFERRING PROVIDER: Thenganatt, Audrea Muscat, MD  ? ? PT End of Session - 05/24/21 1236   ? ? Visit Number 3   ? Number of Visits 25   plus eval  ? Date for PT Re-Evaluation 07/26/21   ? Authorization Type UHC Medicare/ Medicaid of [place]   ? Progress Note Due on Visit 10   ? PT Start Time 1232   ? PT Stop Time 1314   ? PT Time Calculation (min) 42 min   ? Equipment Utilized During Treatment Gait belt   ? Activity Tolerance Patient tolerated treatment well   ? Behavior During Therapy [facility] for tasks assessed/performed   ? ?  ?  ? ?  ? ? ?Past Medical History:  ?Diagnosis Date  ? Bilateral foot pain   ? Chronic pain   ? Diabetes mellitus   ? type 2, diet controlled now  ? Foley catheter in place since nov 2018, last changed 1 week ago  ? Hyperlipidemia   ? Hypertension   ? Inguinal hernia   ? right  ? Numbness   ? T/O  ? Parkinson disease ([place]) 08/26/2015  ? Followed by [facility]. Last visit 06/03/2015. History of poor compliance with his medication. Referred him back to [place] in 10/2016. They didn't want to see him back due to poor compliance with medical advise. Last plan from 06/03/2015 still valid  ? Parkinson's disease ([place])   ? Pneumonia yrs ago  ? Shortness of breath   ? with exertion  ? Urinary retention   ? ?Past Surgical History:  ?Procedure Laterality Date  ? CATARACT EXTRACTION W/ INTRAOCULAR LENS IMPLANT Left 06/2012  ? laser for posterior capsule?  ? COLON SURGERY    ? POSTERIOR CERVICAL FUSION/FORAMINOTOMY N/A 11/12/2013  ? Procedure: Posterior cervical decompression fusion, cervical 3-4, cervical 4-5, cervical 5-6, cervical 6-7 with instrumentation and allograft   (LEVEL 4);  Surgeon: Sinclair Ship, MD;  Location: [facility];  Service: Orthopedics;  Laterality: N/A;  Posterior cervical decompression fusion, cervical 3-4, cervical 4-5, cervical  5-6, cervical 6-7 with instrumentation and allograft  ? TONSILLECTOMY    ? XI ROBOTIC ASSISTED SIMPLE PROSTATECTOMY N/A 03/07/2016  ? Procedure: XI ROBOTIC ASSISTED SIMPLE PROSTATECTOMY AND UMBILICAL HERNIA REPAIR flexible cystoscopy;  Surgeon: Alexis Frock, MD;  Location[facility];  Service: Urology;  Laterality: N/A;  ? ?Patient Active Problem List  ? Diagnosis Date Noted  ? AKI (acute kidney injury) ([place]) 05/19/2021  ? Generalized weakness 05/19/2021  ? Diarrhea 03/21/2021  ? DNR (do not resuscitate) 03/21/2021  ? Sepsis ([place]) 02/17/2021  ? back pain and hip pain 02/17/2021  ? Frequent falls 02/17/2021  ? Acute focal neurological deficit 03/01/2020  ? Ischial pain, right 08/25/2019  ? Palpitations 11/24/2018  ? Onychomycosis 08/05/2017  ? Chronic bilateral low back pain with bilateral sciatica 01/30/2017  ? Memory change 11/09/2016  ? Diabetic polyneuropathy associated with type 2 diabetes mellitus ([place]) 04/13/2016  ? 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 Diminished hearing, left 01/20/2016  ? Type 2 diabetes mellitus with complication ([place]) 02/54/2706  ? Gait abnormality 03/28/2015  ? Postlaminectomy syndrome, cervical region 07/23/2014  ? Parkinson's disease ([place]) 07/23/2014  ? Back pain with sciatica 05/27/2014  ? Bilateral leg pain 05/14/2014  ? Depression 10/15/2013  ? CKD (chronic kidney disease) stage 3, GFR 30-59 ml/min (HCC)  08/25/2013  ? Tremor 05/23/2013  ? Acute renal failure superimposed on stage 3 chronic kidney disease ([place]) 05/08/2013  ? Chronic diastolic heart failure ([place]) 05/06/2013  ? Onychogryposis of toenail 05/14/2012  ? Essential hypertension 01/05/2011  ? ? ?REFERRING DIAG: G20 (ICD-10-CM) - Parkinson's disease R26.89 (ICD-10-CM) - Other abnormalities of gait and mobility  ? ?THERAPY DIAG:  ?Other abnormalities of gait and mobility ? ?Repeated falls ? ?Unsteadiness on feet ? ?Muscle weakness (generalized) ? ?PERTINENT HISTORY:  Anxiety and depression  ? Chronic kidney disease   ? Diabetes mellitus type II, controlled ([place])  ? PAD (peripheral artery disease) ([place])  ? Parkinson disease ([place])  ? ?PRECAUTIONS: Fall ? ?SUBJECTIVE: Pt went to the ER last week. Was found to have acute kidney injury and was discharged home. Pt with resume orders for therapy. No falls. Walked in with [facility] today.  ? ?PAIN:  ?Are you having pain? No ? ? ?OBJECTIVE:  ?  ?  WEIGHT (LBS): 143 (taken on 05/10/21) ?  ?  ?POSTURE: rounded shoulders, forward head, decreased lumbar lordosis, increased thoracic kyphosis, and flexed trunk  ? ?  ?TODAY'S TREATMENT:  ? ?Pt by himself in waiting room. Pt said that his caregiver was in the car. Pt called his caregiver and had PT speak to her to see if she could come in for education for HEP for incr carryover for home. She said that she had already left, but would be available to come in at the end of the session to review HEP. Pt's caregiver never came back in to session and was not in waiting room at the end.  ? ? ? ? ?Pt performs PWR! Moves in seated position:  ?  ?PWR! Up for improved posture x10 reps - cues for open hands, scap retraction.  ? ?PWR! Rock for improved weightshifting - x5 reps each side coming down on thigh and looking up at hand, pt reports incr L hip pain with this, instead modified with big reaching across body which seemed to work better x5 reps bilat.  ? ?PWR! Twist for improved trunk rotation -2 sets of 3 reps each side. Cues for wide BOS, using legs to help pivot and opening up in the middle before twisting to opposite side. Pt with incr difficulty twisting to R. Pt fatigued easily with this exercise and needing restbreaks.  Instead performed modified having hands on the mat and looking over shoulder, cues for slowed pace, x7 reps each side (added this to HEP instead).   ? ?PWR! Step for improved step initiation - attempted side in and step out, but pt unable to perform/coordinate movement esp with LLE (pt kept having to pick up his leg). Instead modified  and performed as seated marching with focus on incr intensity of movement which worked better.  ? ?Cues provided for technique, larger amplitude of movement, and purpose of each and how it relates to function.  ? ?Standing with UE support on chair and using colorful floor dots as visual cues;  ?-lateral stepping strategy and forward/diagonal stepping x7 reps each side of each, verbal cues for incr foot clearance and verbal/manual cues for weight shift.  ? ? ? ?GAIT: ?Gait pattern: decreased step length- Right, decreased step length- Left, decreased stride length, decreased ankle dorsiflexion- Right, decreased ankle dorsiflexion- Left, Right foot flat, Left foot flat, shuffling, festinating, trunk flexed, narrow BOS, poor foot clearance- Right, and poor foot clearance- Left ?Distance walked: clinic distances  ?Assistive device utilized: Environmental consultant - 2 wheeled ?  Level of assistance: CGA ?Comments: Pt sitting in waiting room with SPC. PT got RW from clinic and brought out into waiting room for pt to ambulate in and out of session with. Discussed using RW at all times and bringing it to therapy instead of [facility] for safety.  ? ? ? ?  ?PATIENT EDUCATION: ?Education details: Using RW for safety with gait and bringing it into therapy vs. SPC.  ?Person educated: Patient  ?Education method: Explanation ?Education comprehension: verbalized understanding and needs further education ?  ?  ?HOME EXERCISE PROGRAM: ?Initiated HEP on 05/24/21: ? ?Access Code: [email or web address] ?URL: [email or web address] ?Date: 05/24/2021 ?Prepared by: Janann August ? ?Exercises ?- Sit to Stand with Armchair  - 2 x daily - 5 x weekly - 2 sets - 5 reps - with RW in front of pt, cued for incr forward lean and wider BOS.  ?- Seated March  - 2 x daily - 5 x weekly - 2 sets - 10 reps ? ?Seated PWR ?Modified seated Twist (see pt instructions) ?  ?ASSESSMENT: ?  ?CLINICAL IMPRESSION: ?Today's skilled session focused on initiating HEP for PWR moves for  larger amplitude movement patterns and sit <> stands. Pt's caregiver was supposed to come in at the end of session for review for incr carryover for home, but they were not present. Will have to review at

## 2021-05-26 ENCOUNTER — Ambulatory Visit: Payer: Medicare Other | Admitting: Physical Therapy

## 2021-05-31 ENCOUNTER — Ambulatory Visit: Payer: Medicare Other

## 2021-05-31 ENCOUNTER — Ambulatory Visit: Payer: Medicare Other | Admitting: Occupational Therapy

## 2021-05-31 ENCOUNTER — Telehealth: Payer: Self-pay | Admitting: Physical Therapy

## 2021-05-31 ENCOUNTER — Ambulatory Visit: Payer: Medicare Other | Attending: Psychiatry | Admitting: Physical Therapy

## 2021-05-31 DIAGNOSIS — M25622 Stiffness of left elbow, not elsewhere classified: Secondary | ICD-10-CM | POA: Diagnosis present

## 2021-05-31 DIAGNOSIS — R2689 Other abnormalities of gait and mobility: Secondary | ICD-10-CM | POA: Insufficient documentation

## 2021-05-31 DIAGNOSIS — M25612 Stiffness of left shoulder, not elsewhere classified: Secondary | ICD-10-CM | POA: Diagnosis present

## 2021-05-31 DIAGNOSIS — R278 Other lack of coordination: Secondary | ICD-10-CM | POA: Diagnosis present

## 2021-05-31 DIAGNOSIS — R471 Dysarthria and anarthria: Secondary | ICD-10-CM

## 2021-05-31 DIAGNOSIS — R41841 Cognitive communication deficit: Secondary | ICD-10-CM | POA: Diagnosis present

## 2021-05-31 DIAGNOSIS — M25611 Stiffness of right shoulder, not elsewhere classified: Secondary | ICD-10-CM | POA: Insufficient documentation

## 2021-05-31 DIAGNOSIS — M25621 Stiffness of right elbow, not elsewhere classified: Secondary | ICD-10-CM

## 2021-05-31 DIAGNOSIS — R293 Abnormal posture: Secondary | ICD-10-CM | POA: Insufficient documentation

## 2021-05-31 DIAGNOSIS — R41844 Frontal lobe and executive function deficit: Secondary | ICD-10-CM | POA: Diagnosis present

## 2021-05-31 DIAGNOSIS — M6281 Muscle weakness (generalized): Secondary | ICD-10-CM | POA: Insufficient documentation

## 2021-05-31 DIAGNOSIS — R2681 Unsteadiness on feet: Secondary | ICD-10-CM

## 2021-05-31 NOTE — Therapy (Signed)
?OUTPATIENT SPEECH LANGUAGE PATHOLOGY PARKINSON'S EVALUATION ? ? ?Patient Name: Tyler Lester ?MRN: [id number] ?DOB:05/23/1945, 76 y.o., male ?Today's Date: 05/31/2021 ? ?[facility]P: [facility] ?REFERRING PROVIDER: Thenganatt, Audrea Muscat, MD  ? ? End of Session - 05/31/21 1313   ? ? Visit Number 1   ? Number of Visits 25   ? Date for SLP Re-Evaluation 08/25/21   ? Authorization Type UHC Medicare & Traditional Medicaid   ? SLP Start Time 1315   ? SLP Stop Time  1355   ? SLP Time Calculation (min) 40 min   ? Activity Tolerance Patient tolerated treatment well   ? ?  ?  ? ?  ? ? ?Past Medical History:  ?Diagnosis Date  ? Bilateral foot pain   ? Chronic pain   ? Diabetes mellitus   ? type 2, diet controlled now  ? Foley catheter in place since nov 2018, last changed 1 week ago  ? Hyperlipidemia   ? Hypertension   ? Inguinal hernia   ? right  ? Numbness   ? T/O  ? Parkinson disease ([place]) 08/26/2015  ? [facility]. Last visit 06/03/2015. History of poor compliance with his medication. Referred him back to [place] in 10/2016. They didn't want to see him back due to poor compliance with medical advise. Last plan from 06/03/2015 still valid  ? Parkinson's disease ([place])   ? Pneumonia yrs ago  ? Shortness of breath   ? with exertion  ? Urinary retention   ? ?Past Surgical History:  ?Procedure Laterality Date  ? CATARACT EXTRACTION W/ INTRAOCULAR LENS IMPLANT Left 06/2012  ? laser for posterior capsule?  ? COLON SURGERY    ? POSTERIOR CERVICAL FUSION/FORAMINOTOMY N/A 11/12/2013  ? Procedure: Posterior cervical decompression fusion, cervical 3-4, cervical 4-5, cervical 5-6, cervical 6-7 with instrumentation and allograft   (LEVEL 4);  Surgeon: Sinclair Ship, MD;  Location: [facility];  Service: Orthopedics;  Laterality: N/A;  Posterior cervical decompression fusion, cervical 3-4, cervical 4-5, cervical 5-6, cervical 6-7 with instrumentation and allograft  ? TONSILLECTOMY    ? XI ROBOTIC ASSISTED SIMPLE PROSTATECTOMY N/A 03/07/2016   ? Procedure: XI ROBOTIC ASSISTED SIMPLE PROSTATECTOMY AND UMBILICAL HERNIA REPAIR flexible cystoscopy;  Surgeon: Alexis Frock, MD;  [facility];  Service: Urology;  Laterality: N/A;  ? ?Patient Active Problem List  ? Diagnosis Date Noted  ? AKI (acute kidney injury) ([place]) 05/19/2021  ? Generalized weakness 05/19/2021  ? Diarrhea 03/21/2021  ? DNR (do not resuscitate) 03/21/2021  ? Sepsis ([place]) 02/17/2021  ? back pain and hip pain 02/17/2021  ? Frequent falls 02/17/2021  ? Acute focal neurological deficit 03/01/2020  ? Ischial pain, right 08/25/2019  ? Palpitations 11/24/2018  ? Onychomycosis 08/05/2017  ? Chronic bilateral low back pain with bilateral sciatica 01/30/2017  ? Memory change 11/09/2016  ? Diabetic polyneuropathy associated with type 2 diabetes mellitus ([place]) 04/13/2016  ? 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 Diminished hearing, left 01/20/2016  ? Type 2 diabetes mellitus with complication ([place]) 89/21/1941  ? Gait abnormality 03/28/2015  ? Postlaminectomy syndrome, cervical region 07/23/2014  ? Parkinson's disease ([place]) 07/23/2014  ? Back pain with sciatica 05/27/2014  ? Bilateral leg pain 05/14/2014  ? Depression 10/15/2013  ? CKD (chronic kidney disease) stage 3, GFR 30-59 ml/min (HCC) 08/25/2013  ? Tremor 05/23/2013  ? Acute renal failure superimposed on stage 3 chronic kidney disease ([place]) 05/08/2013  ? Chronic diastolic heart failure ([place]) 05/06/2013  ? Onychogryposis  of toenail 05/14/2012  ? Essential hypertension 01/05/2011  ? ? ?ONSET DATE: diagnosed with PD ~2 years ago ? ?REFERRING DIAG: G20 (ICD-10-CM) - Parkinson's disease  ? ?THERAPY DIAG:  ?Dysarthria and anarthria ? ?Cognitive communication deficit ? ?SUBJECTIVE:  ? ?SUBJECTIVE STATEMENT: ?"My nerves are bad. My voice is getting lower and lower" ?Pt accompanied by: self ? ?PERTINENT HISTORY: Pt reportedly diagnosed with PD ~2 years. PMHX significant for PD, BPH, CHF, CKD 3, depression, diabetes, hypertension, GERD,  hyperlipidemia, gout ? ?PAIN:  ?Are you having pain? No ? ?FALLS: Has patient fallen in last 6 months?  No ? ?LIVING ENVIRONMENT: ?Lives with: lives alone (family visits often and has caregiver coming multiple hours daily) ?Lives in: House/apartment ? ?PLOF:  ?Level of assistance: Needed assistance with ADLs, Needed assistance with IADLS ?Employment: Retired  ? ?PATIENT GOALS "To be able to talk plain and for people to understand me better. People can't hear me on the phone and people have to talk for me." ? ?OBJECTIVE:  ? ?COGNITION: ?Overall cognitive status: Impaired ?Attention: Impaired: Selective, Alternating ?Memory: Impaired: Immediate ?Working ?Short term ?Awareness: Impaired: Intellectual, Emergent, and Anticipatory ?Executive function: Impaired: Error awareness and Slow processing ?Behavior: Within functional limits ?Functional deficits: Pt reported difficulty recalling day of the week  ? ?MOTOR SPEECH: ?Overall motor speech: impaired ?Level of impairment: Conversation ?Respiration: clavicular breathing ?Phonation: low vocal intensity ?Resonance: WFL ?Articulation: Impaired: conversation and reduced (may be in part due to lack of dentition) ?Intelligibility: Intelligibility reduced ?Motor planning: Impaired: aware and inconsistent ?Motor speech errors: inconsistent ?Interfering components:  NA ?Effective technique: increased vocal intensity and pacing ?Comments: "it's getting lower and lower"  ? ?ORAL MOTOR ASSESSMENT:   ?Impaired: Lingual: Comment: reduced lingual coordination/ROM ?Voice: Weak ? ? ?OBJECTIVE ASSESSMENT: ? ?Measured when a sound level meter was placed 30 cm away from pt's mouth, 10 minutes of simple conversational speech was reduced today, at average low to mid 60s (~65) dB (WNL= average 70-72dB) with range of 61-68 dB. Overall speech intelligibility for this listener in a quiet environment was affected, at approximately 80% in quiet environment. Production of loud /a/ averaged 68 dB  without cues for loudness. Pt then rated effort level at 3-4/10 for production of loud /a/ (10=maximal effort). In subsequent task pt was asked to use increased amount of effort as with loud /a/. Loudness average with this increased effort was 80 dB (8/10 effort) with occasional mod cues for loudness. Pt would benefit from skilled ST in order to improve speech intelligibility and pt's QOL. ? ?Pt does not report difficulty with swallowing warranting further evaluation. Pt is consuming softer foods due to limited dentition. Pt denied any coughing or throat clearing during meals.  ? ?Pt endorsed some changes in cognition as he "forgets a little." SLUMS completed with score of 12/26 (deferred written portion due to tremors). Pt was assessed via SLUMS during hospitalization in January 2022 with score of 16/26 achieved. Decline in score indicates increased difficulty with short term recall, attention, and executive functioning. It appears family/caregivers are managing most functional iADLs but pt would like to recall DOW and manage appointments better.  ? ? ? ?PATIENT REPORTED OUTCOME MEASURES (PROM): ?Cognitive function: Short Form: 19 and Communication Participation Item Bank: 28 ? ? ?TODAY'S TREATMENT:  ?SLP educated results of ST evaluation and clinical observations, in which pt verbalized understanding and agreeable to initiation of ST services. Pt was stimulable for increased volume for sustained phonation and short functional phrases when targeted this session.  ? ? ?  PATIENT EDUCATION: ?Education details: see above ?Person educated: Patient ?Education method: Explanation, Demonstration, and Handouts ?Education comprehension: verbalized understanding, returned demonstration, and needs further education ? ? ?HOME EXERCISE PROGRAM: ?Not provided today due to time constraints ? ? ? ? ?GOALS: ?Goals reviewed with patient? Yes ? ?SHORT TERM GOALS: Target date: 06/30/2021 ? ?Pt will complete HEP with occasional mod A over  3 sessions  ?Baseline: ?Goal status: INITIAL ? ?2.  Pt will carryover dysarthria compensations on structured speech tasks with 75% accuracy given occasional mod A over 2 sessions  ?Baseline:  ?Goal status: I

## 2021-05-31 NOTE — Therapy (Signed)
?OUTPATIENT OCCUPATIONAL THERAPY PARKINSON'S EVALUATION ? ?Patient Name: Tyler Lester ?MRN: [id number] ?DOB:1945-08-21, 76 y.o., male ?Today's Date: 06/02/2021 ? ?[facility]: [facility] [place] ?REFERRING PROVIDER: Audrea Muscat Thenganatt ? ? OT End of Session - 06/02/21 0932   ? ? Visit Number 1   ? Number of Visits 25   ? Date for OT Re-Evaluation 08/25/21   ? Authorization Type UHC Medicare   ? Authorization - Visit Number 1   ? Authorization - Number of Visits 10   ? Progress Note Due on Visit 10   ? OT Start Time 1405   ? OT Stop Time 7654   ? OT Time Calculation (min) 40 min   ? Activity Tolerance Patient tolerated treatment well   ? Behavior During Therapy [facility] for tasks assessed/performed   ? ?  ?  ? ?  ? ? ?Past Medical History:  ?Diagnosis Date  ? Bilateral foot pain   ? Chronic pain   ? Diabetes mellitus   ? type 2, diet controlled now  ? Foley catheter in place since nov 2018, last changed 1 week ago  ? Hyperlipidemia   ? Hypertension   ? Inguinal hernia   ? right  ? Numbness   ? T/O  ? Parkinson disease ([place]) 08/26/2015  ? Follow[facility] by [facility]. Last visit 06/03/2015. History of poor compliance with his medication. Referred him back to [place] in 10/2016. They didn't want to see him back due to poor compliance with medical advise. Last plan from 06/03/2015 still valid  ? Parkinson's disease ([place])   ? Pneumonia yrs ago  ? Shortness of breath   ? with exertion  ? Urinary retention   ? ?Past Surgical History:  ?Procedure Laterality Date  ? CATARACT EXTRACTION W/ INTRAOCULAR LENS IMPLANT Left 06/2012  ? laser for posterior capsule?  ? COLON SURGERY    ? POSTERIOR CERVICAL FUSION/FORAMINOTOMY N/A 11/12/2013  ? Procedure: Posterior cervical decompression fusion, cervical 3-4, cervical 4-5, cervical 5-6, cervical 6-7 with instrumentation and allograft   (LEVEL 4);  Surgeon: Sinclair Ship, MD;  Location: [place];  Service: Orthopedics;  Laterality: N/A;  Posterior cervical decompression fusion, cervical 3-4, cervical 4-5, cervical  5-6, cervical 6-7 with instrumentation and allograft  ? TONSILLECTOMY    ? XI ROBOTIC ASSISTED SIMPLE PROSTATECTOMY N/A 03/07/2016  ? Procedure: XI ROBOTIC ASSISTED SIMPLE PROSTATECTOMY AND UMBILICAL HERNIA REPAIR flexible cystoscopy;  Surgeon: Alexis Frock, MD;  [facility];  Service: Urology;  Laterality: N/A;  ? ?Patient Active Problem List  ? Diagnosis Date Noted  ? AKI (acute kidney injury) ([place]) 05/19/2021  ? Generalized weakness 05/19/2021  ? Diarrhea 03/21/2021  ? DNR (do not resuscitate) 03/21/2021  ? Sepsis ([place]) 02/17/2021  ? back pain and hip pain 02/17/2021  ? Frequent falls 02/17/2021  ? Acute focal neurological deficit 03/01/2020  ? Ischial pain, right 08/25/2019  ? Palpitations 11/24/2018  ? Onychomycosis 08/05/2017  ? Chronic bilateral low back pain with bilateral sciatica 01/30/2017  ? Memory change 11/09/2016  ? Diabetic polyneuropathy associated with type 2 diabetes mellitus ([place]) 04/13/2016  ? 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 Diminished hearing, left 01/20/2016  ? Type 2 diabetes mellitus with complication ([place]) 65/04/5463  ? Gait abnormality 03/28/2015  ? Postlaminectomy syndrome, cervical region 07/23/2014  ? Parkinson's disease ([place]) 07/23/2014  ? Back pain with sciatica 05/27/2014  ? Bilateral leg pain 05/14/2014  ? Depression 10/15/2013  ? CKD (chronic kidney disease) stage 3, GFR 30-59 ml/min ([facility]) 08/25/2013  ?  Tremor 05/23/2013  ? Acute renal failure superimposed on stage 3 chronic kidney disease ([place]) 05/08/2013  ? Chronic diastolic heart failure ([place]) 05/06/2013  ? Onychogryposis of toenail 05/14/2012  ? Essential hypertension 01/05/2011  ? ? ?ONSET DATE: 05/11/21 referral date  ? ?REFERRING DIAG: Parkinson's disease ? ?THERAPY DIAG:  ?Unsteadiness on feet - Plan: Ot plan of care cert/re-cert ? ?Muscle weakness (generalized) - Plan: Ot plan of care cert/re-cert ? ?Other lack of coordination - Plan: Ot plan of care cert/re-cert ? ?Other abnormalities of gait and  mobility - Plan: Ot plan of care cert/re-cert ? ?Abnormal posture - Plan: Ot plan of care cert/re-cert ? ?Frontal lobe and executive function deficit - Plan: Ot plan of care cert/re-cert ? ?Stiffness of right shoulder, not elsewhere classified - Plan: Ot plan of care cert/re-cert ? ?Stiffness of left shoulder, not elsewhere classified - Plan: Ot plan of care cert/re-cert ? ?Stiffness of right elbow, not elsewhere classified - Plan: Ot plan of care cert/re-cert ? ?Stiffness of left elbow, not elsewhere classified - Plan: Ot plan of care cert/re-cert ? ?SUBJECTIVE:  ? ?SUBJECTIVE STATEMENT: ?Denies pain ?Pt accompanied by: self ? ?PERTINENT HISTORY: Patient is a 76 year old male with Parkinson's Disease.   ?Pt's PMH is significant for: Anxiety and depression, Chronic kidney disease, Diabetes mellitus type II,  PAD (peripheral artery disease) , chronic diastolic heart failure , posterior cervical fusion ? ?PRECAUTIONS: Fall, hx of cervical fusion ? ?WEIGHT BEARING RESTRICTIONS No ? ?PAIN:  ?Are you having pain? No ? ?FALLS: Has patient fallen in last 6 months? No ? ?LIVING ENVIRONMENT: ?Lives with: lives alone and hired caregiver 9 hours day ?Lives in: House/apartment ?Stairs: No ?Has following equipment at home: Gilford Rile - 2 wheeled and tub bench ? ?PLOF: pt has required assist with ADLS and mobility for approximately 3 years ? ?PATIENT GOALS increase independence ? ?OBJECTIVE:  ? ?HAND DOMINANCE: Right ? ?ADLs: ?Overall ADLs: increased time required ?Transfers/ambulation related to ADLs:amb with RW ?Eating: more spills  ?Grooming: min-mod A ?UB Dressing: mod A ?LB Dressing: mod A ?Toileting: mod I with walker ?Bathing: mod A ?Tub Shower transfers: min A ?Equipment: Transfer tub bench ? ? ?IADLs:Pt's nurse does his shopping cooking and cleaning financial management as well as med management, pt lives alone. ? ? ? ?MOBILITY STATUS:  mod I with RW ? ?POSTURE COMMENTS:  ?rounded shoulders and forward head ? ? ? ?FUNCTIONAL  OUTCOME MEASURES: ? ?Physical performance test: PPT#2 (simulated eating) 38.56, PPT#4: sitting- donned in 55 secs, donned and doffed in 51mn 23 secs ? ?COORDINATION: ?9 Hole Peg test: Right: unable sec; Left: 9 pegs placed in 2 :02 sec ?Box and Blocks:  Right 18 blocks, Left 34 blocks ?Tremors: Resting,Right ? ?UE ROM:   impaired -RUE shoulder flexion :120, elbow extension -15, decreased supination, 90% finger extension, decreased wrist extension ?LUE shoulder flexion 110, elbow extension -35, decreased wrist extension ? ? ? ?SENSATION: ?WFL ? ? ? ?MUSCLE TONE: RUE: Moderate and Rigidity and LUE: Mild and Rigidity ? ?COGNITION: ?Overall cognitive status: Impaired: Attention: Deficits   ?Memory: Deficits short term memory ?Awareness: Deficits   ?Problem solving: Deficits   ?Executive function:impaired  ?Bradyphrenia: slowed processing  ? ? ?OBSERVATIONS:  tremors RUE ? ? ?TODAY'S TREATMENT:  ?N/a ? ? ?PATIENT EDUCATION: ?Education details: role of OT ?Person educated: Patient ?Education method: Explanation ?Education comprehension: verbalized understanding ? ? ?HOME EXERCISE PROGRAM: ?N/a ? ?ASSESSMENT: ? ?CLINICAL IMPRESSION: ?Patient is a 76y.o. male  who was  seen today for occupational therapy evaluation for Parkinson's disease. PMH is significant for: Anxiety and depression, Chronic kidney disease, Diabetes mellitus type II, controlled ([place]), PAD (peripheral artery disease) , hx of cervical fusion.  ? ?PERFORMANCE DEFICITS in functional skills including ADLs, IADLs, coordination, dexterity, tone, ROM, strength, flexibility, FMC, GMC, mobility, balance, endurance, decreased knowledge of precautions, decreased knowledge of use of DME, and UE functional use, cognitive skills including attention, energy/drive, learn, memory, problem solving, safety awareness, sequencing, thought, and understand, and psychosocial skills including coping strategies and routines and behaviors.  ? ?IMPAIRMENTS are limiting patient from  ADLs, IADLs, play, leisure, and social participation.  ? ?COMORBIDITIES may have co-morbidities  that affects occupational performance. Patient will benefit from skilled OT to address above impairme

## 2021-05-31 NOTE — Telephone Encounter (Signed)
Called pt's friend [name] regarding pt's no show to PT today. Felicia reported that she forgot pt had PT, but was going to bring pt for OT/ST evals later today. ? ?Janann August, PT, DPT ?05/31/21 12:56 PM  ? ?

## 2021-06-02 ENCOUNTER — Ambulatory Visit: Payer: Medicare Other | Admitting: Physical Therapy

## 2021-06-07 ENCOUNTER — Ambulatory Visit: Payer: Medicare Other | Admitting: Physical Therapy

## 2021-06-07 ENCOUNTER — Ambulatory Visit: Payer: Medicare Other | Admitting: Speech Pathology

## 2021-06-07 DIAGNOSIS — M6281 Muscle weakness (generalized): Secondary | ICD-10-CM

## 2021-06-07 DIAGNOSIS — R2689 Other abnormalities of gait and mobility: Secondary | ICD-10-CM

## 2021-06-07 DIAGNOSIS — R2681 Unsteadiness on feet: Secondary | ICD-10-CM | POA: Diagnosis not present

## 2021-06-07 DIAGNOSIS — R471 Dysarthria and anarthria: Secondary | ICD-10-CM

## 2021-06-07 NOTE — Therapy (Signed)
?OUTPATIENT PHYSICAL THERAPY TREATMENT NOTE ? ? ?Patient Name: Tyler Lester ?MRN: [id number] ?DOB:January 02, 1946, 76 y.o., male ?Today's Date: 06/07/2021 ? ?[facility]P: [facility] ?REFERRING PROVIDER: Thenganatt, Audrea Muscat, MD  ? ? PT End of Session - 06/07/21 1326   ? ? Visit Number 4   ? Number of Visits 25   plus eval  ? Date for PT Re-Evaluation 07/26/21   ? Authorization Type UHC Medicare/ Medicaid of [place]   ? Progress Note Due on Visit 10   ? PT Start Time 1315   ? PT Stop Time 1358   ? PT Time Calculation (min) 43 min   ? Equipment Utilized During Treatment Gait belt   ? Activity Tolerance Patient tolerated treatment well   ? Behavior During Therapy [facility] for tasks assessed/performed   ? ?  ?  ? ?  ? ? ? ?Past Medical History:  ?Diagnosis Date  ? Bilateral foot pain   ? Chronic pain   ? Diabetes mellitus   ? type 2, diet controlled now  ? Foley catheter in place since nov 2018, last changed 1 week ago  ? Hyperlipidemia   ? Hypertension   ? Inguinal hernia   ? right  ? Numbness   ? T/O  ? Parkinson disease ([place]) 08/26/2015  ? [facility]. Last visit 06/03/2015. History of poor compliance with his medication. Referred him back to [place] in 10/2016. They didn't want to see him back due to poor compliance with medical advise. Last plan from 06/03/2015 still valid  ? Parkinson's disease ([place])   ? Pneumonia yrs ago  ? Shortness of breath   ? with exertion  ? Urinary retention   ? ?Past Surgical History:  ?Procedure Laterality Date  ? CATARACT EXTRACTION W/ INTRAOCULAR LENS IMPLANT Left 06/2012  ? laser for posterior capsule?  ? COLON SURGERY    ? POSTERIOR CERVICAL FUSION/FORAMINOTOMY N/A 11/12/2013  ? Procedure: Posterior cervical decompression fusion, cervical 3-4, cervical 4-5, cervical 5-6, cervical 6-7 with instrumentation and allograft   (LEVEL 4);  Surgeon: Sinclair Ship, MD;  Location: [place];  Service: Orthopedics;  Laterality: N/A;  Posterior cervical decompression fusion, cervical 3-4, cervical 4-5,  cervical 5-6, cervical 6-7 with instrumentation and allograft  ? TONSILLECTOMY    ? XI ROBOTIC ASSISTED SIMPLE PROSTATECTOMY N/A 03/07/2016  ? Procedure: XI ROBOTIC ASSISTED SIMPLE PROSTATECTOMY AND UMBILICAL HERNIA REPAIR flexible cystoscopy;  Surgeon: Alexis Frock, MD;  [facility];  Service: Urology;  Laterality: N/A;  ? ?Patient Active Problem List  ? Diagnosis Date Noted  ? AKI (acute kidney injury) ([place]) 05/19/2021  ? Generalized weakness 05/19/2021  ? Diarrhea 03/21/2021  ? DNR (do not resuscitate) 03/21/2021  ? Sepsis ([place]) 02/17/2021  ? back pain and hip pain 02/17/2021  ? Frequent falls 02/17/2021  ? Acute focal neurological deficit 03/01/2020  ? Ischial pain, right 08/25/2019  ? Palpitations 11/24/2018  ? Onychomycosis 08/05/2017  ? Chronic bilateral low back pain with bilateral sciatica 01/30/2017  ? Memory change 11/09/2016  ? Diabetic polyneuropathy associated with type 2 diabetes mellitus ([place]) 04/13/2016  ? 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 Diminished hearing, left 01/20/2016  ? Type 2 diabetes mellitus with complication ([place]) 27/78/2423  ? Gait abnormality 03/28/2015  ? Postlaminectomy syndrome, cervical region 07/23/2014  ? Parkinson's disease ([place]) 07/23/2014  ? Back pain with sciatica 05/27/2014  ? Bilateral leg pain 05/14/2014  ? Depression 10/15/2013  ? CKD (chronic kidney disease) stage 3, GFR 30-59 ml/min (  [place]) 08/25/2013  ? Tremor 05/23/2013  ? Acute renal failure superimposed on stage 3 chronic kidney disease ([place]) 05/08/2013  ? Chronic diastolic heart failure ([place]) 05/06/2013  ? Onychogryposis of toenail 05/14/2012  ? Essential hypertension 01/05/2011  ? ? ?REFERRING DIAG: G20 (ICD-10-CM) - Parkinson's disease R26.89 (ICD-10-CM) - Other abnormalities of gait and mobility  ? ?THERAPY DIAG:  ?Unsteadiness on feet ? ?Muscle weakness (generalized) ? ?Other abnormalities of gait and mobility ? ?PERTINENT HISTORY:  Anxiety and depression  ? Chronic kidney disease  ?  Diabetes mellitus type II, controlled ([place])  ? PAD (peripheral artery disease) ([place])  ? Parkinson disease ([place])  ? ?PRECAUTIONS: Fall ? ?SUBJECTIVE: Pt arrived and noted large mass on R proximal collar bone, has not had it checked out. Pt reports he first noted the mass last week.  ? ?PAIN:  ?Are you having pain? No ? ? ?OBJECTIVE:  ?  ?  WEIGHT (LBS): 143 (taken on 05/10/21) ?  ?  ?POSTURE: rounded shoulders, forward head, decreased lumbar lordosis, increased thoracic kyphosis, and flexed trunk  ? ?  ?TODAY'S TREATMENT:  ? Pt sitting in waiting room alone, had RW with him today.  ? ?Ther Act  ? STG assessment  ? [facility] PT Assessment - 06/07/21 1332   ? ?  ? Transfers  ? Five time sit to stand comments  27.29s w/BUE support and CGA   ?  ? Ambulation/Gait  ? Gait velocity 1.43 ft/s with RW and CGA   ?  ? Berg Balance Test  ? Sit to Stand Able to stand without using hands and stabilize independently   ? Standing Unsupported Able to stand 2 minutes with supervision   ? Sitting with Back Unsupported but Feet Supported on Floor or Stool Able to sit safely and securely 2 minutes   ? Stand to Sit Sits safely with minimal use of hands   ? Transfers Able to transfer with verbal cueing and /or supervision   ? Standing Unsupported with Eyes Closed Able to stand 10 seconds with supervision   ? Standing Unsupported with Feet Together Able to place feet together independently and stand for 1 minute with supervision   ? From Standing, Reach Forward with Outstretched Arm Reaches forward but needs supervision   ? From Standing Position, Pick up Object from Floor Able to pick up shoe, needs supervision   ? From Standing Position, Turn to Look Behind Over each Shoulder Needs supervision when turning   ? Turn 360 Degrees Needs close supervision or verbal cueing   ? Standing Unsupported, Alternately Place Feet on Step/Stool Needs assistance to keep from falling or unable to try   Completed one tap and required max A to prevent fall  ?  Standing Unsupported, One Foot in [employer] balance while stepping or standing   ? Standing on One Leg Tries to lift leg/unable to hold 3 seconds but remains standing independently   ? Total Score 30   ? Berg comment: High fall risk   ? ?  ?  ? ?  ?Self-care/home management  ?Called pt's caregiver, [name], to inform her of large mass on pt's R collarbone and recommendations to see a doctor as soon as possible. Also requested caregiver join PT session on Friday to review HEP and discuss safety at home with pt. Felicia verbalized understanding.  ? ?Lengthy discussion regarding pt's progress towards goals and recommendations for pt to go to doctor to check large mass on collarbone. Pt reluctant to go  to doctor but verbalized understanding.  ? ? ?PATIENT EDUCATION: ?Education details: Going to doctor to have large mass checked, having caregiver join next session, continuing HEP, POC  ?Person educated: Patient  ?Education method: Explanation ?Education comprehension: verbalized understanding and needs further education ?  ?  ?HOME EXERCISE PROGRAM: ?Initiated HEP on 05/24/21: ? ?Access Code: [email or web address] ?URL: [email or web address] ?Date: 05/24/2021 ?Prepared by: Janann August ? ?Exercises ?- Sit to Stand with Armchair  - 2 x daily - 5 x weekly - 2 sets - 5 reps - with RW in front of pt, cued for incr forward lean and wider BOS.  ?- Seated March  - 2 x daily - 5 x weekly - 2 sets - 10 reps ? ?Seated PWR ?Modified seated Twist (see pt instructions) ?  ?ASSESSMENT: ?  ?CLINICAL IMPRESSION: ?Emphasis of skilled PT session on STG assessment. Pt has achieved 2 of 5 STGs. Pt's gait speed has remained consistent since eval and pt's goals limited due to caregiver not being present for sessions. Therapist called and spoke to pt's caregiver during session and requested she be present for PT session Friday to review safety at home and HEP with pt. Encouraged Felicia and pt to see a doctor regarding pt's mass on R  collarbone, as therapist has not noted the mass before and pt states it is new. Continue POC.  ? ?  ?  ?OBJECTIVE IMPAIRMENTS Abnormal gait, decreased activity tolerance, decreased balance, decreased cognition, decreased

## 2021-06-07 NOTE — Therapy (Signed)
?OUTPATIENT SPEECH LANGUAGE PATHOLOGY TREATMENT NOTE ? ? ?Patient Name: Tyler Lester ?MRN: [id number] ?DOB:09/06/1945, 76 y.o., male ?Today's Date: 06/07/2021 ? ?PCP: [facility] ?REFERRING PROVIDER: Thenganatt, Audrea Muscat, MD ? ?END OF SESSION:  ? End of Session - 06/07/21 1344   ? ? Visit Number 2   ? Number of Visits 25   ? Date for SLP Re-Evaluation 08/25/21   ? Authorization Type UHC Medicare & Traditional Medicaid   ? SLP Start Time 1400   ? SLP Stop Time  1445   ? SLP Time Calculation (min) 45 min   ? Activity Tolerance Patient tolerated treatment well   ? ?  ?  ? ?  ? ? ?Past Medical History:  ?Diagnosis Date  ? Bilateral foot pain   ? Chronic pain   ? Diabetes mellitus   ? type 2, diet controlled now  ? Foley catheter in place since nov 2018, last changed 1 week ago  ? Hyperlipidemia   ? Hypertension   ? Inguinal hernia   ? right  ? Numbness   ? T/O  ? Parkinson disease ([place]) 08/26/2015  ? Followed by [facility]. Last visit 06/03/2015. History of poor compliance with his medication. Referred him back to [place] in 10/2016. They didn't want to see him back due to poor compliance with medical advise. Last plan from 06/03/2015 still valid  ? Parkinson's disease ([place])   ? Pneumonia yrs ago  ? Shortness of breath   ? with exertion  ? Urinary retention   ? ?Past Surgical History:  ?Procedure Laterality Date  ? CATARACT EXTRACTION W/ INTRAOCULAR LENS IMPLANT Left 06/2012  ? laser for posterior capsule?  ? COLON SURGERY    ? POSTERIOR CERVICAL FUSION/FORAMINOTOMY N/A 11/12/2013  ? Procedure: Posterior cervical decompression fusion, cervical 3-4, cervical 4-5, cervical 5-6, cervical 6-7 with instrumentation and allograft   (LEVEL 4);  Surgeon: Sinclair Ship, MD;  Location: [facility];  Service: Orthopedics;  Laterality: N/A;  Posterior cervical decompression fusion, cervical 3-4, cervical 4-5, cervical 5-6, cervical 6-7 with instrumentation and allograft  ? TONSILLECTOMY    ? XI ROBOTIC ASSISTED SIMPLE PROSTATECTOMY N/A  03/07/2016  ? Procedure: XI ROBOTIC ASSISTED SIMPLE PROSTATECTOMY AND UMBILICAL HERNIA REPAIR flexible cystoscopy;  Surgeon: Alexis Frock, MD;  Location: [facility];  Service: Urology;  Laterality: N/A;  ? ?Patient Active Problem List  ? Diagnosis Date Noted  ? AKI (acute kidney injury) ([place]) 05/19/2021  ? Generalized weakness 05/19/2021  ? Diarrhea 03/21/2021  ? DNR (do not resuscitate) 03/21/2021  ? Sepsis ([place]) 02/17/2021  ? back pain and hip pain 02/17/2021  ? Frequent falls 02/17/2021  ? Acute focal neurological deficit 03/01/2020  ? Ischial pain, right 08/25/2019  ? Palpitations 11/24/2018  ? Onychomycosis 08/05/2017  ? Chronic bilateral low back pain with bilateral sciatica 01/30/2017  ? Memory change 11/09/2016  ? Diabetic polyneuropathy associated with type 2 diabetes mellitus ([place]) 04/13/2016  ? 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 Diminished hearing, left 01/20/2016  ? Type 2 diabetes mellitus with complication ([place]) 53/66/4403  ? Gait abnormality 03/28/2015  ? Postlaminectomy syndrome, cervical region 07/23/2014  ? Parkinson's disease ([place]) 07/23/2014  ? Back pain with sciatica 05/27/2014  ? Bilateral leg pain 05/14/2014  ? Depression 10/15/2013  ? CKD (chronic kidney disease) stage 3, GFR 30-59 ml/min (HCC) 08/25/2013  ? Tremor 05/23/2013  ? Acute renal failure superimposed on stage 3 chronic kidney disease ([place]) 05/08/2013  ? Chronic diastolic heart failure ([place]) 05/06/2013  ?  Onychogryposis of toenail 05/14/2012  ? Essential hypertension 01/05/2011  ? ? ?ONSET DATE: diagnosed with PD ~2 years ago ? ?REFERRING DIAG: G20 (ICD-10-CM) - Parkinson's disease  ? ?THERAPY DIAG:  ?Dysarthria and anarthria ? ?SUBJECTIVE: "It bothers me I can't do what I used to. My voice goes up and down." ? ?PAIN:  ?Are you having pain? No ? ?OBJECTIVE:  ? ?TODAY'S TREATMENT: ?SKILLED THERAPY:  ?06/07/2021: Targeting increasing pt's vocal intensity using format of speak out! Formal implementation of this program  contraindicated at this time 2/2 cognitive barriers. Pt unable to complete initial cognitive exercise, does not count to 12 despite max cues. Guided pt through lesson 1 of Speak Out!, modifying as necessary. ST provides models of all exercise. Pt requiring usual max-A to increase vocal intensity. Benefits from cue to use "powerful voice" Using intent does not resonant with pt, tells ST he does not understand.  ?Loud /a/: average 90 dB (2-4 second duration) Noted to have harsh vocal quality, unable to mitigate ?Counting: 71 dB ?            Oral reading (phrases): average 70 dB ?            Spontaneous speech, answering simple ?s: 61 dB ?  ?PATIENT EDUCATION: ?Education details: see above ?Person educated: Patient ?Education method: Explanation, Demonstration, and Handouts ?Education comprehension: verbalized understanding, returned demonstration, and needs further education ?  ?  ?HOME EXERCISE PROGRAM: ?Loud "ahs" 5x/day, use "powerful voice"  ?  ?  ?GOALS: ?Goals reviewed with patient? Yes ?  ?SHORT TERM GOALS: Target date: 06/30/2021 ?  ?Pt will complete HEP with occasional mod A over 3 sessions  ?Baseline: ?Goal status: ONGOING ?  ?2.  Pt will carryover dysarthria compensations on structured speech tasks with 75% accuracy given occasional mod A over 2 sessions  ?Baseline:  ?Goal status: ONGOING ?  ?3.  Pt will maintain WNL conversational volume (70-72 dB) in 5 minute conversation given usual mod A over 2 sessions  ?Baseline:  ?Goal status: ONGOING ?  ?4.  Pt will implement memory attention/compensations to aid daily functioning given usual mod A over 2 sessions  ?Baseline:  ?Goal status: ONGOING ?  ?LONG TERM GOALS: Target date:  08/25/21 ?  ?Pt will complete HEP at least 1x/day (BID recommended) >1 week ?Baseline:  ?Goal status: ONGOING ?  ?2.  Pt will carryover dysarthria compensations on structured speech tasks with 90% accuracy given occasional min A over 2 sessions  ?Baseline:  ?Goal status: ONGOING ?  ?3.  Pt  will maintain WNL conversational volume (70-72 dB) in 10-15 minute conversation/telephone call given occasional min A over 2 sessions  ?Baseline:  ?Goal status: ONGOING ?  ?4.  Pt will implement memory attention/compensations to aid daily functioning given occasional mod A over 2 sessions  ?Baseline:  ?Goal status: ONGOING ?  ?5.  Pt will report improved cognitive functioning and communication effectiveness via PROM by 2 points at last ST session ?Baseline:  ?Goal status: ONGOING ?  ?  ?ASSESSMENT: ?  ?CLINICAL IMPRESSION: ?Patient is a 76 y.o. 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 Pt has reportedly not received ST intervention prior. Pt would like to be better understood over the phone and be less reliant on family/caregivers for appointment management. Pt would benefit from skilled ST intervention to optimize communication effectiveness and functional independence.  ?  ?OBJECTIVE IMPAIRMENTS  ?Objective impairments include attention, memory, executive functioning,  and dysarthria. These impairments are limiting patient from household responsibilities, ADLs/IADLs, and effectively communicating at home and in community. Factors affecting potential to achieve goals and functional outcome are medical prognosis, previous level of function, and family/community support. Patient will benefit from skilled SLP services to address above impairments and improve overall function. ?  ?REHAB POTENTIAL: Good ?  ?PLAN: ?SLP FREQUENCY: 2x/week ?  ?SLP DURATION: 12 weeks ?  ?PLANNED INTERVENTIONS: Language facilitation, Cueing hierachy, Cognitive reorganization, Internal/external aids, Functional tasks, Multimodal communication approach, SLP instruction and feedback, Compensatory strategies, and Patient/family education ? ?Su Monks, CF-SLP ?06/07/2021, 1:44 PM ?  ?

## 2021-06-09 ENCOUNTER — Ambulatory Visit: Payer: Medicare Other | Admitting: Physical Therapy

## 2021-06-09 ENCOUNTER — Ambulatory Visit: Payer: Medicare Other | Admitting: Speech Pathology

## 2021-06-13 ENCOUNTER — Encounter: Payer: Self-pay | Admitting: Internal Medicine

## 2021-06-13 ENCOUNTER — Other Ambulatory Visit: Payer: Medicare Other | Admitting: Internal Medicine

## 2021-06-13 VITALS — BP 118/60 | HR 85 | Resp 16

## 2021-06-13 DIAGNOSIS — R634 Abnormal weight loss: Secondary | ICD-10-CM

## 2021-06-13 DIAGNOSIS — G20 Parkinson's disease: Secondary | ICD-10-CM

## 2021-06-13 DIAGNOSIS — K089 Disorder of teeth and supporting structures, unspecified: Secondary | ICD-10-CM

## 2021-06-13 DIAGNOSIS — R222 Localized swelling, mass and lump, trunk: Secondary | ICD-10-CM

## 2021-06-13 DIAGNOSIS — Z9189 Other specified personal risk factors, not elsewhere classified: Secondary | ICD-10-CM

## 2021-06-13 DIAGNOSIS — G20A1 Parkinson's disease without dyskinesia, without mention of fluctuations: Secondary | ICD-10-CM

## 2021-06-13 DIAGNOSIS — Z515 Encounter for palliative care: Secondary | ICD-10-CM

## 2021-06-13 NOTE — Progress Notes (Signed)
[facility] Follow-Up Visit Telephone: [phone]  Fax: [phone]   Date of encounter: 06/13/21 12:15 PM PATIENT NAME: Tyler Lester [address] [place] [address]   [phone] (home)  DOB: 1945-03-18 MRN: [id number] PRIMARY CARE PROVIDER:    [facility],  [place] [id number] [phone]  [place], [facility] [address] [place],  [place] [address] [phone]  RESPONSIBLE PARTY:    Contact Information     Name Relation Home Work Mobile   Tyler Lester   [phone]        I met face to face with patient and family in his home/facility. Palliative Care was asked to follow this patient by consultation request of  [facility], [facility] to address advance care planning and complex medical decision making. This is follow-up visit.                                     ASSESSMENT AND PLAN / RECOMMENDATIONS:   Advance Care Planning/Goals of Care: Goals include to maximize quality of life and symptom management. Patient/health care surrogate gave his/her permission to discuss.Our advance care planning conversation included a discussion about:    The value and importance of advance care planning  Experiences with loved ones who have been seriously ill or have died  Exploration of personal, cultural or spiritual beliefs that might influence medical decisions  Exploration of goals of care in the event of a sudden injury or illness  Identification  of a healthcare agent  Review and updating or creation of an  advance directive document . Decision not to resuscitate or to de-escalate disease focused treatments due to poor prognosis. CODE STATUS:  DNR, trying to avoid hospitalizations  Symptom Management/Plan: 1. Parkinson's disease ([place]) -continue current medication regimen -ideally should have nearly 24x7 care but has not had family  involved at all and caregiver/friend also takes care of her own mother who has dementia -[place] social services monitor him  2. Weight loss -due likely to mass on shoulder (?lymph node and underlying lung ca), not helped by his cognitive decline nor his poor dentition, being alone much of day and not alert enough or capable of preparing meals himself  3. Supraclavicular mass -? Lung cancer or lymphoma causing weight loss, was to f/u with PCP for further assessment--encouraged f/u here  4. At risk for medication error -caregiver expressed concern about his changed med list with hospitalization--pt reports feeling better since the changes were made   5. Poor dentition cannot chew a lot of things as many teeth are bad--needs dentist  6. Palliative care by specialist -pt reports feeling a lot better this week and that he's been eating more--likes fast food so that's what Tyler his caregiver provides (he won't eat what she's cooking anymore)    Follow up Palliative Care Visit: Palliative care will continue to follow for complex medical decision making, advance care planning, and clarification of goals. Return 4 weeks or prn.   This visit was coded based on medical decision making (MDM).  PPS: 40%  HOSPICE ELIGIBILITY/DIAGNOSIS: TBD  Chief Complaint: Follow-up palliative visit  HISTORY OF PRESENT ILLNESS:  Tyler Lester is a 76 y.o. year old male with Parkinson's disease with dementia, falls, poor dentition, chronic back pain, constipation, and most recently hospitalization for weakness where he was found to have acute on  chronic renal failure with cr up to 2.31 from baseline 1.5.  Returned to 1.9 before discharge.    When I arrived, he had eaten some crackers so far.  Tyler was out running errands.  Had a sprite and 3/4 of a 16oz water so far.  Says [name] will feed him when she arrives.  She was with her mother who has dementia.  He was 134 lbs at the hospital and had been 164  lbs in December.   He reports no falls since he's been home from the hospital.  Says he's eating better since he's been home.  I asked three meals per day and he just answers--I'm eating better.  Says his pain is in the middle--not bad but still has some.  Anxiety is improved since hospitalization.  There are multiple pill bottles setting out and 5 cups of pills 2 of which have been taken, 3 remain full.   He reported difficulty with his teeth interfering with eating.  There have apparently been challenges getting him in with a dentist.  History obtained from review of EMR, discussion with primary team, and interview with family, facility staff/caregiver and/or Tyler Lester.  I reviewed available labs, medications, imaging, studies and related documents from the EMR.  Records reviewed and summarized above.   ROS  General: NAD EYES: denies vision changes ENMT: denies dysphagia, poor dentition and dental pain, getting outpatient ST Cardiovascular: denies chest pain, denies DOE Pulmonary: denies cough, denies increased SOB Abdomen: endorses good appetite but losing weight--likes only fast food lately, reports hydrating well but appears dry, has constipation, endorses incontinence of bowel GU: denies dysuria, endorses incontinence of urine MSK:  has increased weakness,  h/o falls reported, getting outpatient PT Skin: denies rashes or wounds Neurological: has chronic low back pain, denies insomnia Psych: Endorses positive mood Heme/lymph/immuno: denies bruises, abnormal bleeding. Supraclavicular mass present  Physical Exam: Current and past weights:  no recent weight  Constitutional: NAD General: frail appearing, thin EYES: anicteric sclera, lids intact, no discharge  ENMT: intact hearing, oral mucous membranes dry, dentition poor; supraclavicular mass on right CV: S1S2, RRR, no LE edema Pulmonary: LCTA, no increased work of breathing, no cough, room air Abdomen: intake 100% of what is  given, normo-active BS + 4 quadrants, soft and non tender, no ascites GU: deferred MSK: has sarcopenia, moves all extremities, ambulatory with walker, resting tremor, also has akathisia with choreiform movements, rigidity of UEs Skin: warm and dry, no rashes or wounds on visible skin Neuro: has generalized weakness,  has cognitive impairment Psych: non-anxious affect, A and O x 2 Hem/lymph/immuno: no widespread bruising  CURRENT PROBLEM LIST:  Patient Active Problem List   Diagnosis Date Noted   AKI (acute kidney injury) ([place]) 05/19/2021   Generalized weakness 05/19/2021   Diarrhea 03/21/2021   DNR (do not resuscitate) 03/21/2021   Sepsis ([place]) 02/17/2021   back pain and hip pain 02/17/2021   Frequent falls 02/17/2021   Acute focal neurological deficit 03/01/2020   Ischial pain, right 08/25/2019   Palpitations 11/24/2018   Onychomycosis 08/05/2017   Chronic bilateral low back pain with bilateral sciatica 01/30/2017   Memory change 11/09/2016   Diabetic polyneuropathy associated with type 2 diabetes mellitus ([place]) 04/13/2016   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   Diminished hearing, left 01/20/2016   Type 2 diabetes mellitus with complication ([place]) 25/95/6387   Gait abnormality 03/28/2015   Postlaminectomy syndrome, cervical region 07/23/2014   Parkinson's disease ([place]) 07/23/2014   Back pain  with sciatica 05/27/2014   Bilateral leg pain 05/14/2014   Depression 10/15/2013   CKD (chronic kidney disease) stage 3, GFR 30-59 ml/min (HCC) 08/25/2013   Tremor 05/23/2013   Acute renal failure superimposed on stage 3 chronic kidney disease ([place]) 05/08/2013   Chronic diastolic heart failure ([place]) 05/06/2013   Onychogryposis of toenail 05/14/2012   Essential hypertension 01/05/2011   PAST MEDICAL HISTORY:  Active Ambulatory Problems    Diagnosis Date Noted   Essential hypertension 01/05/2011   Onychogryposis of toenail 05/14/2012   Chronic diastolic heart failure  ([place]) 05/06/2013   Acute renal failure superimposed on stage 3 chronic kidney disease (HCC) 05/08/2013   Tremor 05/23/2013   CKD (chronic kidney disease) stage 3, GFR 30-59 ml/min (HCC) 08/25/2013   Depression 10/15/2013   Bilateral leg pain 05/14/2014   Back pain with sciatica 05/27/2014   Postlaminectomy syndrome, cervical region 07/23/2014   Parkinson's disease ([place]) 07/23/2014   Gait abnormality 03/28/2015   Type 2 diabetes mellitus with complication ([place]) 43/32/9518   Diminished hearing, left 01/20/2016   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   Diabetic polyneuropathy associated with type 2 diabetes mellitus ([place]) 04/13/2016   Memory change 11/09/2016   Chronic bilateral low back pain with bilateral sciatica 01/30/2017   Onychomycosis 08/05/2017   Palpitations 11/24/2018   Ischial pain, right 08/25/2019   Acute focal neurological deficit 03/01/2020   Sepsis ([place]) 02/17/2021   back pain and hip pain 02/17/2021   Frequent falls 02/17/2021   Diarrhea 03/21/2021   DNR (do not resuscitate) 03/21/2021   AKI (acute kidney injury) ([place]) 05/19/2021   Generalized weakness 05/19/2021   Resolved Ambulatory Problems    Diagnosis Date Noted   Prediabetes 01/05/2011   Chronic pain 01/05/2011   Anxiety 01/05/2011   Skin lesion of lower extremity 01/26/2011   Organic impotence 01/26/2011   URI (upper respiratory infection) 05/17/2011   Otitis externa 05/17/2011   GERD (gastroesophageal reflux disease) 07/04/2011   Fatigue 07/05/2011   Preseptal cellulitis 10/23/2011   Diarrhea 10/23/2011   Hearing loss 12/18/2011   Right hip pain 12/18/2011   Community acquired pneumonia 02/09/2012   Urinary retention 02/09/2012   Weight loss 02/09/2012   Court decision 02/09/2012   Cough 03/27/2012   Shoulder pain 03/27/2012   Neck pain on left side 04/25/2012   Bilateral lower extremity edema 05/14/2012   Neck pain on right side 07/14/2012   Knee pain, bilateral 12/12/2012   Gait  instability 02/21/2013   Acute upper respiratory infections of unspecified site 04/30/2013   Encounter for chronic pain management 07/13/2013   Soft tissue mass 08/03/2013   Myelopathy ([place]) 11/12/2013   Tremor of left hand 01/05/2014   Hearing loss of left ear due to cerumen impaction 04/27/2014   Loss of weight 05/27/2014   Spondylosis, cervical, with myelopathy 07/23/2014   Stool incontinence 10/08/2014   Cough 10/08/2014   Left arm swelling 11/11/2014   Hypokalemia 11/11/2014   Shortness of breath 03/28/2015   Dyspnea 06/22/2015   Rhinitis, allergic 08/25/2015   Parkinson disease ([place]) 08/26/2015   Chronic back pain 09/16/2015   Routine adult health maintenance 06/18/2016   Leg swelling 07/27/2016   PAD (peripheral artery disease) ([place]) 07/28/2016   Enrolled in chronic care management 10/02/2016   Coordination of complex care 10/04/2016   Hyperlipidemia 11/05/2016   Care plan discussed with patient 06/27/2017   Carpal tunnel syndrome of left wrist 11/06/2016   Cubital tunnel syndrome on left 11/06/2016   Hypotension 02/09/2019  Acute kidney failure ([place]) 10/13/2020   Past Medical History:  Diagnosis Date   Bilateral foot pain    Diabetes mellitus    Foley catheter in place since nov 2018, last changed 1 week ago   Hypertension    Inguinal hernia    Numbness    Pneumonia yrs ago   SOCIAL HX:  Social History   Tobacco Use   Smoking status: Never   Smokeless tobacco: Never  Substance Use Topics   Alcohol use: Yes    Alcohol/week: 0.0 standard drinks    Comment: 1 shot a day     ALLERGIES:  Allergies  Allergen Reactions   Poison Ivy Treatments Hives     PERTINENT MEDICATIONS:  Outpatient Encounter Medications as of 06/13/2021  Medication Sig   allopurinol (ZYLOPRIM) 300 MG tablet Take 300 mg by mouth every morning.   amantadine (SYMMETREL) 100 MG capsule Take 100 mg by mouth 2 (two) times daily.   carbidopa-levodopa (SINEMET CR) 50-200 MG tablet Take 2  tablets by mouth See admin instructions. 2 tablets at 5pm and bedtime (9-10pm)   carbidopa-levodopa (SINEMET IR) 25-250 MG tablet Take 1 tablet by mouth See admin instructions. Take one tablet by mouth at 9am and 1pm   diclofenac Sodium (VOLTAREN) 1 % GEL Apply 2-4 g topically daily as needed (leg and hip pain).   donepezil (ARICEPT) 10 MG tablet Take 1 tablet (10 mg total) by mouth every morning.   hydrOXYzine (ATARAX) 50 MG tablet Take 50 mg by mouth at bedtime as needed (sleep).   naloxone (NARCAN) nasal spray 4 mg/0.1 mL Place 4 mg into the nose once as needed (opioid overdose).   omeprazole (PRILOSEC) 40 MG capsule Take 80 mg by mouth every morning.   oxyCODONE-acetaminophen (PERCOCET) 10-325 MG tablet Take 1 tablet by mouth 2 (two) times daily as needed for pain.   Vitamin D, Ergocalciferol, (DRISDOL) 1.25 MG (50000 UNIT) CAPS capsule Take 50,000 Units by mouth every Sunday.   No facility-administered encounter medications on file as of 06/13/2021.   Thank you for the opportunity to participate in the care of Mr. Markarian.  The palliative care team will continue to follow. Please call our office at [phone] if we can be of additional assistance.   Hollace Kinnier, DO  COVID-19 PATIENT SCREENING TOOL Asked and negative response unless otherwise noted:  Have you had symptoms of covid, tested positive or been in contact with someone with symptoms/positive test in the past 5-10 days? no

## 2021-06-14 ENCOUNTER — Ambulatory Visit: Payer: Medicare Other | Admitting: Occupational Therapy

## 2021-06-14 ENCOUNTER — Ambulatory Visit: Payer: Medicare Other | Admitting: Physical Therapy

## 2021-06-14 ENCOUNTER — Encounter: Payer: Self-pay | Admitting: Physical Therapy

## 2021-06-14 DIAGNOSIS — R293 Abnormal posture: Secondary | ICD-10-CM

## 2021-06-14 DIAGNOSIS — M6281 Muscle weakness (generalized): Secondary | ICD-10-CM

## 2021-06-14 DIAGNOSIS — R2681 Unsteadiness on feet: Secondary | ICD-10-CM

## 2021-06-14 DIAGNOSIS — M25611 Stiffness of right shoulder, not elsewhere classified: Secondary | ICD-10-CM

## 2021-06-14 DIAGNOSIS — R278 Other lack of coordination: Secondary | ICD-10-CM

## 2021-06-14 DIAGNOSIS — R41844 Frontal lobe and executive function deficit: Secondary | ICD-10-CM

## 2021-06-14 DIAGNOSIS — M25621 Stiffness of right elbow, not elsewhere classified: Secondary | ICD-10-CM

## 2021-06-14 DIAGNOSIS — R2689 Other abnormalities of gait and mobility: Secondary | ICD-10-CM

## 2021-06-14 DIAGNOSIS — M25612 Stiffness of left shoulder, not elsewhere classified: Secondary | ICD-10-CM

## 2021-06-14 DIAGNOSIS — M25622 Stiffness of left elbow, not elsewhere classified: Secondary | ICD-10-CM

## 2021-06-14 NOTE — Therapy (Signed)
?OUTPATIENT PHYSICAL THERAPY TREATMENT NOTE ? ? ?Patient Name: Tyler Lester ?MRN: [id number] ?DOB:[birth year], 76 y.o., male ?Today's Date: 06/14/2021 ? ?PCP: [facility] ?REFERRING PROVIDER: Thenganatt, Audrea Muscat, MD  ? ? PT End of Session - 06/14/21 1412   ? ? Visit Number 5   ? Number of Visits 25   plus eval  ? Date for PT Re-Evaluation 07/26/21   ? Authorization Type UHC Medicare/ Medicaid of [place]   ? Progress Note Due on Visit 10   ? PT Start Time 1318   ? PT Stop Time 1358   ? PT Time Calculation (min) 40 min   ? Equipment Utilized During Treatment Gait belt   ? Activity Tolerance Patient tolerated treatment well   ? Behavior During Therapy [facility] for tasks assessed/performed   ? ?  ?  ? ?  ? ? ? ? ?Past Medical History:  ?Diagnosis Date  ? Bilateral foot pain   ? Chronic pain   ? Diabetes mellitus   ? type 2, diet controlled now  ? Foley catheter in place since nov 2018, last changed 1 week ago  ? Hyperlipidemia   ? Hypertension   ? Inguinal hernia   ? right  ? Numbness   ? T/O  ? Parkinson disease ([place]) 08/26/2015  ? [facility]ed by [facility]. Last visit 06/03/2015. History of poor compliance with his medication. Referred him back to [facility] in 10/2016. They didn't want to see him back due to poor compliance with medical advise. Last plan from 06/03/2015 still valid  ? Parkinson's disease ([place])   ? Pneumonia yrs ago  ? Shortness of breath   ? with exertion  ? Urinary retention   ? ?Past Surgical History:  ?Procedure Laterality Date  ? CATARACT EXTRACTION W/ INTRAOCULAR LENS IMPLANT Left 06/2012  ? laser for posterior capsule?  ? COLON SURGERY    ? POSTERIOR CERVICAL FUSION/FORAMINOTOMY N/A 11/12/2013  ? Procedure: Posterior cervical decompression fusion, cervical 3-4, cervical 4-5, cervical 5-6, cervical 6-7 with instrumentation and allograft   (LEVEL 4);  Surgeon: Sinclair Ship, MD;  Location: [facility];  Service: Orthopedics;  Laterality: N/A;  Posterior cervical decompression fusion, cervical 3-4, cervical 4-5,  cervical 5-6, cervical 6-7 with instrumentation and allograft  ? TONSILLECTOMY    ? XI ROBOTIC ASSISTED SIMPLE PROSTATECTOMY N/A 03/07/2016  ? Procedure: XI ROBOTIC ASSISTED SIMPLE PROSTATECTOMY AND UMBILICAL HERNIA REPAIR flexible cystoscopy;  Surgeon: Alexis Frock, MD;  [facility];  Service: Urology;  Laterality: N/A;  ? ?Patient Active Problem List  ? Diagnosis Date Noted  ? AKI (acute kidney injury) ([place]) 05/19/2021  ? Generalized weakness 05/19/2021  ? Diarrhea 03/21/2021  ? DNR (do not resuscitate) 03/21/2021  ? Sepsis ([place]) 02/17/2021  ? back pain and hip pain 02/17/2021  ? Frequent falls 02/17/2021  ? Acute focal neurological deficit 03/01/2020  ? Ischial pain, right 08/25/2019  ? Palpitations 11/24/2018  ? Onychomycosis 08/05/2017  ? Chronic bilateral low back pain with bilateral sciatica 01/30/2017  ? Memory change 11/09/2016  ? Diabetic polyneuropathy associated with type 2 diabetes mellitus ([place]) 04/13/2016  ? 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 Diminished hearing, left 01/20/2016  ? Type 2 diabetes mellitus with complication ([place]) 84/13/2440  ? Gait abnormality 03/28/2015  ? Postlaminectomy syndrome, cervical region 07/23/2014  ? Parkinson's disease ([place]) 07/23/2014  ? Back pain with sciatica 05/27/2014  ? Bilateral leg pain 05/14/2014  ? Depression 10/15/2013  ? CKD (chronic kidney disease) stage 3, GFR 30-59  ml/min ([place]) 08/25/2013  ? Tremor 05/23/2013  ? Acute renal failure superimposed on stage 3 chronic kidney disease ([place]) 05/08/2013  ? Chronic diastolic heart failure ([place]) 05/06/2013  ? Onychogryposis of toenail 05/14/2012  ? Essential hypertension 01/05/2011  ? ? ?REFERRING DIAG: G20 (ICD-10-CM) - Parkinson's disease R26.89 (ICD-10-CM) - Other abnormalities of gait and mobility  ? ?THERAPY DIAG:  ?Unsteadiness on feet ? ?Muscle weakness (generalized) ? ?Abnormal posture ? ?Other abnormalities of gait and mobility ? ?PERTINENT HISTORY:  Anxiety and depression  ? Chronic  kidney disease  ? Diabetes mellitus type II, controlled ([place])  ? PAD (peripheral artery disease) ([place])  ? Parkinson disease ([place])  ? ?PRECAUTIONS: Fall ? ?SUBJECTIVE: Pt's caregiver reports that he went to [facility] regarding the mass on his collarbone. States that he had an x-ray and they said that it was just bone and is nothing to worry about. Pt's caregiver going to call to get more information regarding this. No falls.  ? ?PAIN:  ?Are you having pain? No ? ? ?OBJECTIVE:  ?  ?  WEIGHT (LBS): 143 (taken on 05/10/21) ?  ?  ?POSTURE: rounded shoulders, forward head, decreased lumbar lordosis, increased thoracic kyphosis, and flexed trunk  ? ?  ?TODAY'S TREATMENT:  ?Reviewed HEP with pt's caregiver for improved carryover at home. Provided new handout as pt's caregiver had not seen it before.  ?   ?Pt performs PWR! Moves in seated position:  ?  ?PWR! Up for improved posture x10 reps - cues for open hands, scap retraction.  ?  ?PWR! Rock for improved weightshifting - x5 reps each side with big reaching across body  x5 reps bilat.  ?  ?PWR! Twist for improved trunk rotation -performed modified having hands on the mat and looking over shoulder, cues for slowed pace, x7 reps each side ?  ?PWR! Step for improved step initiation - performed step in and step out x8 reps each side. Pt with incr difficulty with LLE.  ?  ?Cues provided for technique, larger amplitude of movement, and purpose of each and how it relates to function.  ? ?Standing w/ BUE support in // bars: ?-alternating marching x10 reps each leg for improved foot clearance/larger amplitude of movement.  ?-alternating stepping over 2" obstacle > 4" obstacle x10 reps each leg, cues for weight shifting and foot clearance.  ? ?GAIT: ?Gait pattern: decreased step length- Right, decreased step length- Left, decreased stride length, decreased ankle dorsiflexion- Right, decreased ankle dorsiflexion- Left, Right foot flat, Left foot flat, shuffling,  festinating, trunk flexed, narrow BOS, poor foot clearance- Right, and poor foot clearance- Left ?Distance walked: 2 x 60'  ?Assistive device utilized: Environmental consultant - 2 wheeled ?Level of assistance: CGA ?Comments: Provided tennis balls to pt's walker. Cues for posture and longer stride length. Pt fatigued after 60' once he got to the // bars and needing to sit down prior to standing exercises. Discussed with pt and pt's caregiver beginning a walking program at home with RW- walking laps around the house starting at 1-2 minutes 2-3 times a day, with pt walking with Felicia. Both verbalized understanding.  ?  ? ?2 sets of 5 reps of sit <> stands, cues for scooting out towards edge and leaning forwards and wider BOS. Pt with RW in front. Reviewed from HEP.  ? ? ?PATIENT EDUCATION: ?Education details: Walking program w/ RW, reviewed HEP w/ caregiver and provided new handout.  ?Person educated: Patient and caregiver ?Education method: Explanation, demo and handout ?  Education comprehension: verbalized understanding and needs further education ?  ?  ?HOME EXERCISE PROGRAM: ?Initiated HEP on 05/24/21: ? ?Access Code: [email or web address] ?URL: [email or web address] ?Date: 05/24/2021 ?Prepared by: Janann August ? ?Exercises ?- Sit to Stand with Armchair  - 2 x daily - 5 x weekly - 2 sets - 5 reps - with RW in front of pt, cued for incr forward lean and wider BOS.  ?- Seated March  - 2 x daily - 5 x weekly - 2 sets - 10 reps ? ?Seated PWR ?Modified seated Twist (see pt instructions) ?  ?ASSESSMENT: ?  ?CLINICAL IMPRESSION: ?Pt's caregiver present during session today. Reviewed HEP for sit <> stands and seated PWR moves for incr carryover at home. Also added a walking program with RW for improved endurance and to focus on taking bigger steps. Pt fatigued after walking ~60' and needed a seated rest break before standing exercises. Will continue to progress towards LTGs.  ? ? ?  ?  ?OBJECTIVE IMPAIRMENTS Abnormal gait, decreased  activity tolerance, decreased balance, decreased cognition, decreased coordination, decreased endurance, decreased knowledge of condition, decreased knowledge of use of DME, decreased mobility, difficulty walking, decre

## 2021-06-14 NOTE — Patient Instructions (Signed)
? ? ? ?  ELBOW: Extension / Chest Press (Frame) ? ? ? ?Lie on back with knees bent. Straighten elbows to raise frame.  Hold paper towel roll  __10_ reps per set, __1_ sets per day, _7__ days per week ?Use steering wheel or hula hoop instead of frame. ? ?Copyright ? VHI. All rights reserved.  ? ?Shoulder: Flexion (Supine) ? ? ? ?With hands shoulder width apart, slowly lower paper towel roll over head  Do not let elbows bend. Keep back flat. ?Hold __5__ seconds. Repeat __10__ times. Do __1__ sessions per day. ?CAUTION: Stretch slowly and gently. ? ?Copyright ? VHI. All rights reserved.  ?

## 2021-06-14 NOTE — Therapy (Signed)
?OUTPATIENT OCCUPATIONAL THERAPY TREATMENT NOTE ? ? ?Patient Name: Tyler Lester ?MRN: [id number] ?DOB:[birth year], 76 y.o., male ?Today's Date: 06/14/2021 ? ?PCP: [facility] ?REFERRING PROVIDER  Dr. Buck Mam ? ?END OF SESSION:  ? OT End of Session - 06/14/21 1424   ? ? Visit Number 2   ? Number of Visits 25   ? Date for OT Re-Evaluation 08/25/21   ? Authorization - Visit Number 2   ? Authorization - Number of Visits 10   ? Progress Note Due on Visit 10   ? OT Start Time 1405   ? OT Stop Time 4098   ? OT Time Calculation (min) 40 min   ? ?  ?  ? ?  ? ? ?Past Medical History:  ?Diagnosis Date  ? Bilateral foot pain   ? Chronic pain   ? Diabetes mellitus   ? type 2, diet controlled now  ? Foley catheter in place since nov 2018, last changed 1 week ago  ? Hyperlipidemia   ? Hypertension   ? Inguinal hernia   ? right  ? Numbness   ? T/O  ? Parkinson disease ([place]) 08/26/2015  ? [facility]. Last visit 06/03/2015. History of poor compliance with his medication. Referred him back to [facility] in 10/2016. They didn't want to see him back due to poor compliance with medical advise. Last plan from 06/03/2015 still valid  ? Parkinson's disease ([place])   ? Pneumonia yrs ago  ? Shortness of breath   ? with exertion  ? Urinary retention   ? ?Past Surgical History:  ?Procedure Laterality Date  ? CATARACT EXTRACTION W/ INTRAOCULAR LENS IMPLANT Left 06/2012  ? laser for posterior capsule?  ? COLON SURGERY    ? POSTERIOR CERVICAL FUSION/FORAMINOTOMY N/A 11/12/2013  ? Procedure: Posterior cervical decompression fusion, cervical 3-4, cervical 4-5, cervical 5-6, cervical 6-7 with instrumentation and allograft   (LEVEL 4);  Surgeon: Sinclair Ship, MD;  Location: [facility];  Service: Orthopedics;  Laterality: N/A;  Posterior cervical decompression fusion, cervical 3-4, cervical 4-5, cervical 5-6, cervical 6-7 with instrumentation and allograft  ? TONSILLECTOMY    ? XI ROBOTIC ASSISTED SIMPLE PROSTATECTOMY N/A 03/07/2016  ? Procedure:  XI ROBOTIC ASSISTED SIMPLE PROSTATECTOMY AND UMBILICAL HERNIA REPAIR flexible cystoscopy;  Surgeon: Alexis Frock, MD;  [facility];  Service: Urology;  Laterality: N/A;  ? ?Patient Active Problem List  ? Diagnosis Date Noted  ? AKI (acute kidney injury) ([place]) 05/19/2021  ? Generalized weakness 05/19/2021  ? Diarrhea 03/21/2021  ? DNR (do not resuscitate) 03/21/2021  ? Sepsis ([place]) 02/17/2021  ? back pain and hip pain 02/17/2021  ? Frequent falls 02/17/2021  ? Acute focal neurological deficit 03/01/2020  ? Ischial pain, right 08/25/2019  ? Palpitations 11/24/2018  ? Onychomycosis 08/05/2017  ? Chronic bilateral low back pain with bilateral sciatica 01/30/2017  ? Memory change 11/09/2016  ? Diabetic polyneuropathy associated with type 2 diabetes mellitus ([place]) 04/13/2016  ? 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 Diminished hearing, left 01/20/2016  ? Type 2 diabetes mellitus with complication ([place]) 11/91/4782  ? Gait abnormality 03/28/2015  ? Postlaminectomy syndrome, cervical region 07/23/2014  ? Parkinson's disease ([place]) 07/23/2014  ? Back pain with sciatica 05/27/2014  ? Bilateral leg pain 05/14/2014  ? Depression 10/15/2013  ? CKD (chronic kidney disease) stage 3, GFR 30-59 ml/min ([facility]) 08/25/2013  ? Tremor 05/23/2013  ? Acute renal failure superimposed on stage 3 chronic kidney disease ([place]) 05/08/2013  ? Chronic  diastolic heart failure ([place]) 05/06/2013  ? Onychogryposis of toenail 05/14/2012  ? Essential hypertension 01/05/2011  ? ? ?ONSET DATE: 05/11/21 ? ?REFERRING DIAG: Parkinson's disease ? ?THERAPY DIAG:  ?No diagnosis found. ? ? ?PERTINENT HISTORY: Patient is a 76 y.o. male  with  Parkinson's disease. PMH is significant for: Anxiety and depression, Chronic kidney disease, Diabetes mellitus type II, controlled ([place]), PAD (peripheral artery disease) , hx of cervical fusion.  ? ?PRECAUTIONS: fall risk, cervical fusion ? ?SUBJECTIVE: Denies pain ? ?PAIN:  ?Are you having pain?  No ? ? ? ? ? ? ?TODAY'S TREATMENT: ?Pt./ caregiver were educated in initial HEP. Se pt instructions. ?PWR! Up in seated 10 reps, min v.c ?Seated functional reaching to right and left sides mid range in seared. ? ?Simulated ADL, crumpling plastic bag with left and right hands and passing behind back mod v.c ? ? ? ? ? ? ?  ? ? ? ? ?PATIENT EDUCATION: ?Education details: HEP ?Person educated: Patient, caregiver ?Education method: Explanation, demonstration ?Education comprehension: verbalized understanding, returned demonstration ? ? ?HOME EXERCISE PROGRAM: ?N/a ? ?ASSESSMENT: ? ?CLINICAL IMPRESSION: ?Pt is progressing towards goals. Pt/ caregiver demonstrates understanding of HEP. ? ? ?PERFORMANCE DEFICITS in functional skills including ADLs, IADLs, coordination, dexterity, tone, ROM, strength, flexibility, FMC, GMC, mobility, balance, endurance, decreased knowledge of precautions, decreased knowledge of use of DME, and UE functional use, cognitive skills including attention, energy/drive, learn, memory, problem solving, safety awareness, sequencing, thought, and understand, and psychosocial skills including coping strategies and routines and behaviors.  ? ?IMPAIRMENTS are limiting patient from ADLs, IADLs, play, leisure, and social participation.  ? ?COMORBIDITIES may have co-morbidities  that affects occupational performance. Patient will benefit from skilled OT to address above impairments and improve overall function. ? ?MODIFICATION OR ASSISTANCE TO COMPLETE EVALUATION: Min-Moderate modification of tasks or assist with assess necessary to complete an evaluation.  ? ?OT OCCUPATIONAL PROFILE AND HISTORY: Detailed assessment: Review of records and additional review of physical, cognitive, psychosocial history related to current functional performance. ? ?CLINICAL DECISION MAKING: Moderate - several treatment options, min-mod task modification necessary ? ?REHAB POTENTIAL: Good ? ?EVALUATION COMPLEXITY:  Moderate ? ? ? ? ?GOALS: ?Potential goals discussed with pt. ? ?SHORT TERM GOALS: Target date: 06/30/2021 ? ?I with PD specific HEP ? ?Goal status: INITIAL ? ?2.  Pt will verbalize understanding of adapted strategies to maximize safety and I with ADLs/ IADLs . ? ? ?Goal status: INITIAL ? ?3.  Pt will demonstrate improved bilateral UE functional use as evidenced by increasing box/ blocks score by 3 blocks for RUE. ?Baseline: Box and Blocks:  Right 18 blocks, Left 34 blocks ?Goal status: INITIAL ? ?4.  Pt will demonstrate improved ease with feeding as evidenced by decreasing PPT#2 (self feeding) by 3 secs ?Baseline: 38.56 ?Goal status: INITIAL ? ?5.  Pt will demonstrate improved fine motor coordination for ADLs as evidenced by placing 9 pegs into 9 hole peg test in 1 min and 55 secs with LUE  ?Baseline: 2:02 to place 9 pegs  ?Goal status: INITIAL ? ? ? ?LONG TERM GOALS: Target date: 08/25/2021 ? ?Pt will verbalize understanding of ways to prevent future PD related complications ? ? ?Goal status: INITIAL ? ?2.  Pt will demonstrate increased ease with dressing  by donning a jacket in 51 secs on less in seated ? ?Baseline: 55 secs to donn ?Goal status: INITIAL ? ?3.  Pt will demonstrate ability to retrieve a lightweight object at 115 shoulder flexion and -30 elbow  extension with LUE ? ?Baseline: 110 shoulder flexion, -35 elbow extension ?Goal status: INITIAL ? ?4.  Pt will demonstrate ability to retrieve a lightweight object at 125 shoulder flexion and -10 elbow extension with RUE ? ?Baseline: RUE shoulder flexion 120, elbow extension -15 ?Goal status: INITIAL ? ?5.  Pt will demonstrate understanding of memory/ cognitive compensations ? ?Goal status: INITIAL ? ? ? ?PLAN: ?OT FREQUENCY: 2x/weekplus eval ? ?OT DURATION: 12 weeks ? ?PLANNED INTERVENTIONS: self care/ADL training, therapeutic exercise, therapeutic activity, neuromuscular re-education, manual therapy, passive range of motion, gait training, balance training,  functional mobility training, splinting, electrical stimulation, ultrasound, paraffin, fluidotherapy, moist heat, cryotherapy, patient/family education, cognitive remediation/compensation, visual/perceptual remediation/compensation

## 2021-06-16 ENCOUNTER — Ambulatory Visit: Payer: Medicare Other | Admitting: Physical Therapy

## 2021-06-16 ENCOUNTER — Telehealth: Payer: Self-pay | Admitting: Internal Medicine

## 2021-06-16 NOTE — Telephone Encounter (Signed)
I called Tyler Lester, pt's caregiver and friend, to review his medication list with her due to concerns she had and to review how she felt he was doing since his hospital stay; however, she informed me that she'd spoken to him at 9am this morning, was going to pick him up at 10am to go out to get fast food and some new clothes that fit him better after he'd lost weight.  When she arrived at his home, he had died.  Will inform our admin team.  Also, did offer her grief counseling services through [facility] if needed.   ?

## 2021-06-21 ENCOUNTER — Ambulatory Visit: Payer: Medicare Other | Admitting: Physical Therapy

## 2021-06-21 ENCOUNTER — Ambulatory Visit: Payer: Medicare Other | Admitting: Speech Pathology

## 2021-06-21 ENCOUNTER — Ambulatory Visit: Payer: Medicare Other | Admitting: Occupational Therapy

## 2021-06-23 ENCOUNTER — Ambulatory Visit: Payer: Medicare Other | Admitting: Speech Pathology

## 2021-06-23 ENCOUNTER — Ambulatory Visit: Payer: Medicare Other | Admitting: Physical Therapy

## 2021-06-26 DEATH — deceased

## 2021-06-28 ENCOUNTER — Ambulatory Visit: Payer: Medicare Other | Admitting: Physical Therapy

## 2021-06-28 ENCOUNTER — Encounter: Payer: Medicare Other | Admitting: Occupational Therapy

## 2021-06-30 ENCOUNTER — Ambulatory Visit: Payer: Medicare Other | Admitting: Physical Therapy

## 2021-07-05 ENCOUNTER — Ambulatory Visit: Payer: Medicare Other | Admitting: Physical Therapy

## 2021-07-05 ENCOUNTER — Encounter: Payer: Medicare Other | Admitting: Speech Pathology

## 2021-07-05 ENCOUNTER — Encounter: Payer: Medicare Other | Admitting: Occupational Therapy

## 2021-07-07 ENCOUNTER — Encounter: Payer: Medicare Other | Admitting: Speech Pathology

## 2021-07-07 ENCOUNTER — Ambulatory Visit: Payer: Medicare Other | Admitting: Physical Therapy

## 2021-07-12 ENCOUNTER — Ambulatory Visit: Payer: Medicare Other | Admitting: Physical Therapy

## 2021-07-12 ENCOUNTER — Encounter: Payer: Medicare Other | Admitting: Occupational Therapy

## 2021-07-14 ENCOUNTER — Ambulatory Visit: Payer: Medicare Other | Admitting: Physical Therapy

## 2021-07-14 ENCOUNTER — Encounter: Payer: Medicare Other | Admitting: Speech Pathology

## 2021-07-14 ENCOUNTER — Encounter: Payer: Medicare Other | Admitting: Occupational Therapy

## 2021-07-19 ENCOUNTER — Encounter: Payer: Medicare Other | Admitting: Occupational Therapy

## 2021-07-19 ENCOUNTER — Ambulatory Visit: Payer: Medicare Other | Admitting: Physical Therapy

## 2021-07-21 ENCOUNTER — Ambulatory Visit: Payer: Medicare Other | Admitting: Physical Therapy

## 2021-07-26 ENCOUNTER — Encounter: Payer: Medicare Other | Admitting: Occupational Therapy

## 2021-07-26 ENCOUNTER — Ambulatory Visit: Payer: Medicare Other | Admitting: Physical Therapy

## 2021-07-28 ENCOUNTER — Ambulatory Visit: Payer: Medicare Other | Admitting: Physical Therapy

## 2021-07-28 ENCOUNTER — Encounter: Payer: Medicare Other | Admitting: Occupational Therapy

## 2021-08-08 ENCOUNTER — Other Ambulatory Visit: Payer: Medicare Other | Admitting: Internal Medicine

## 2022-04-06 IMAGING — CR DG HIP (WITH OR WITHOUT PELVIS) 3-4V BILAT
4 series · 4 of 4 positions shown · non-contrast
Comparison: None.

CLINICAL DATA: Bilateral ischial pain

EXAM:
DG HIP (WITH OR WITHOUT PELVIS) 3-4V BILAT

[t pelvis ap]
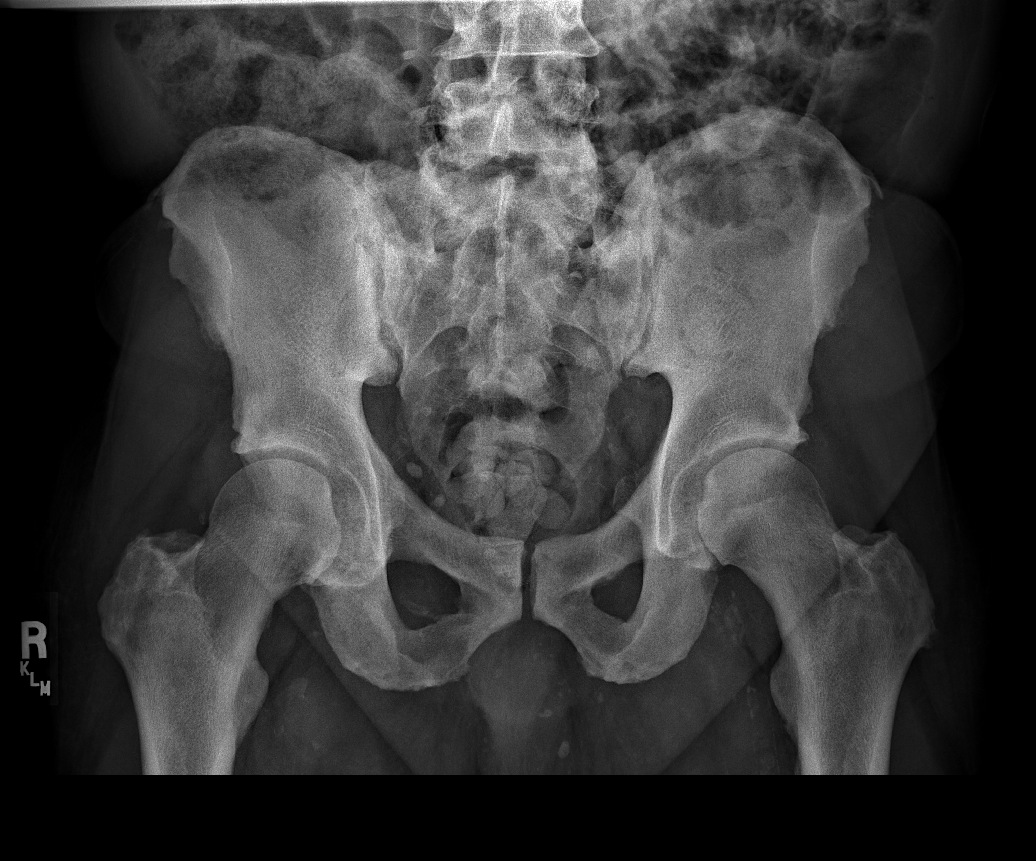

[t hip ap left]
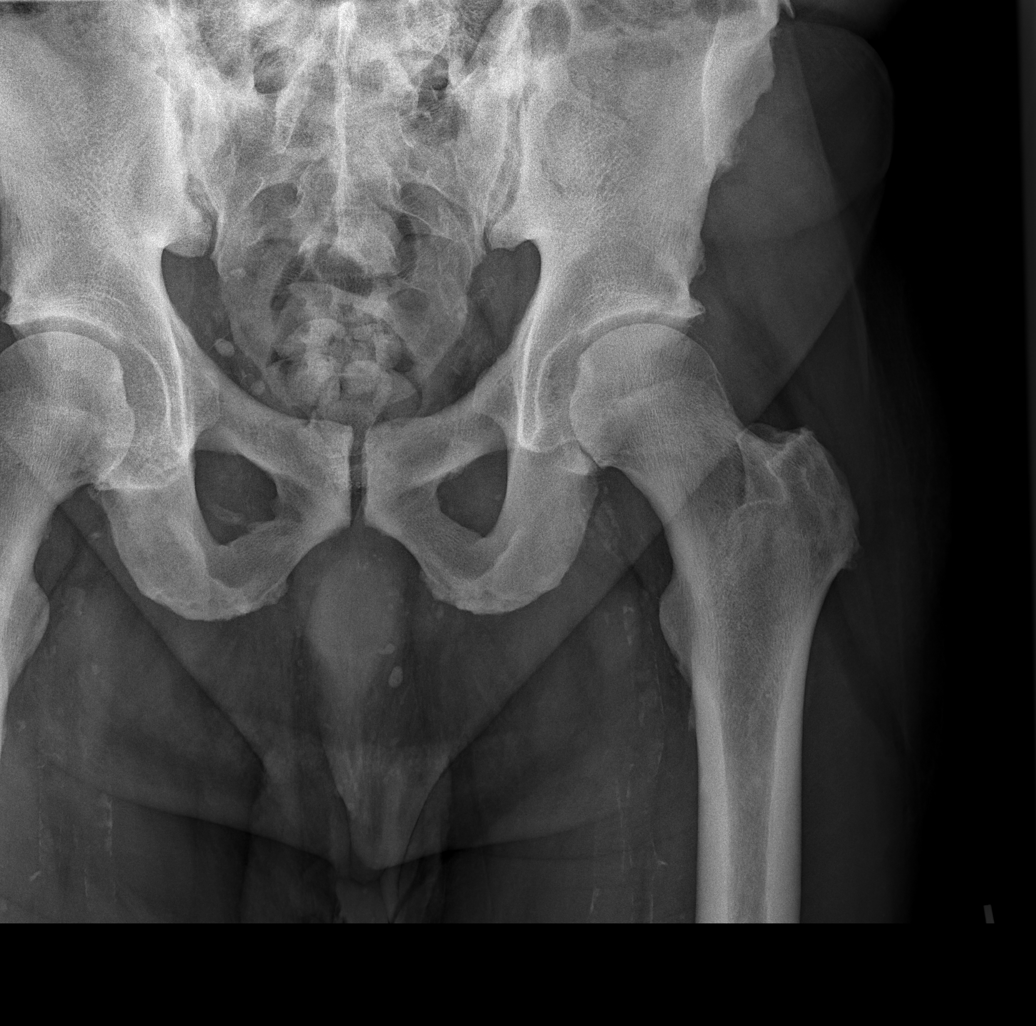

[t hip frog left]
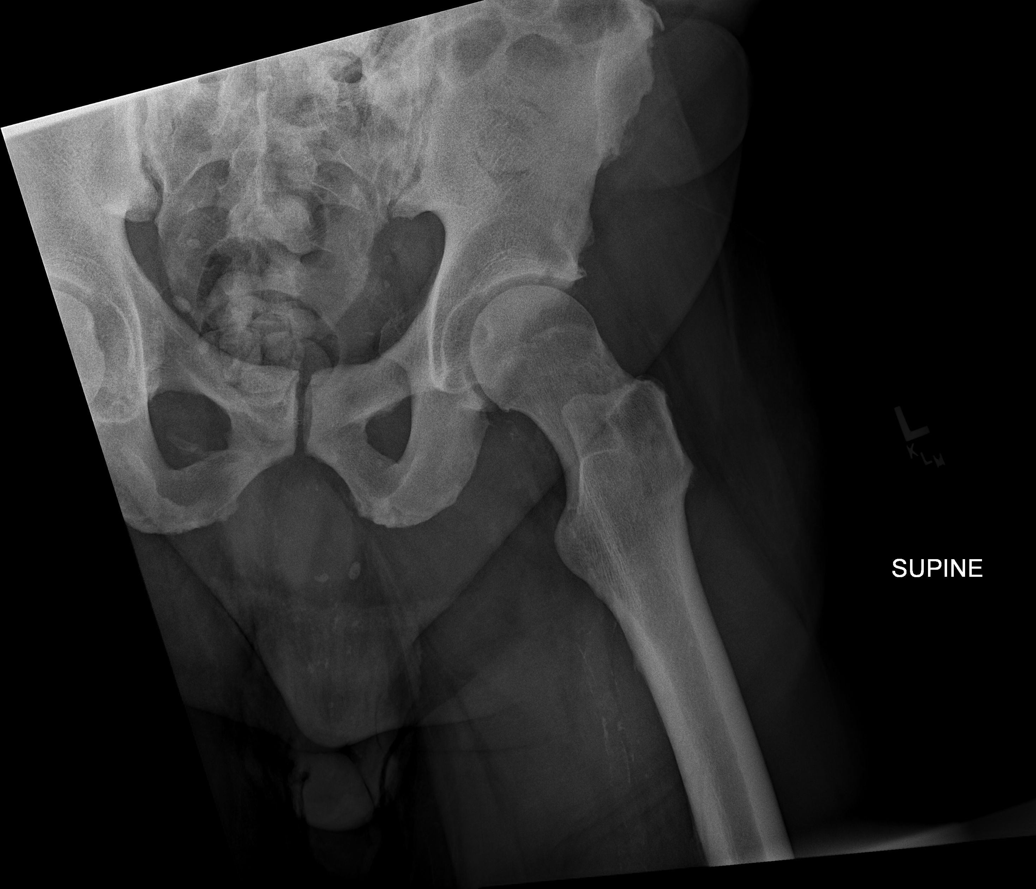

[t hip ap right]
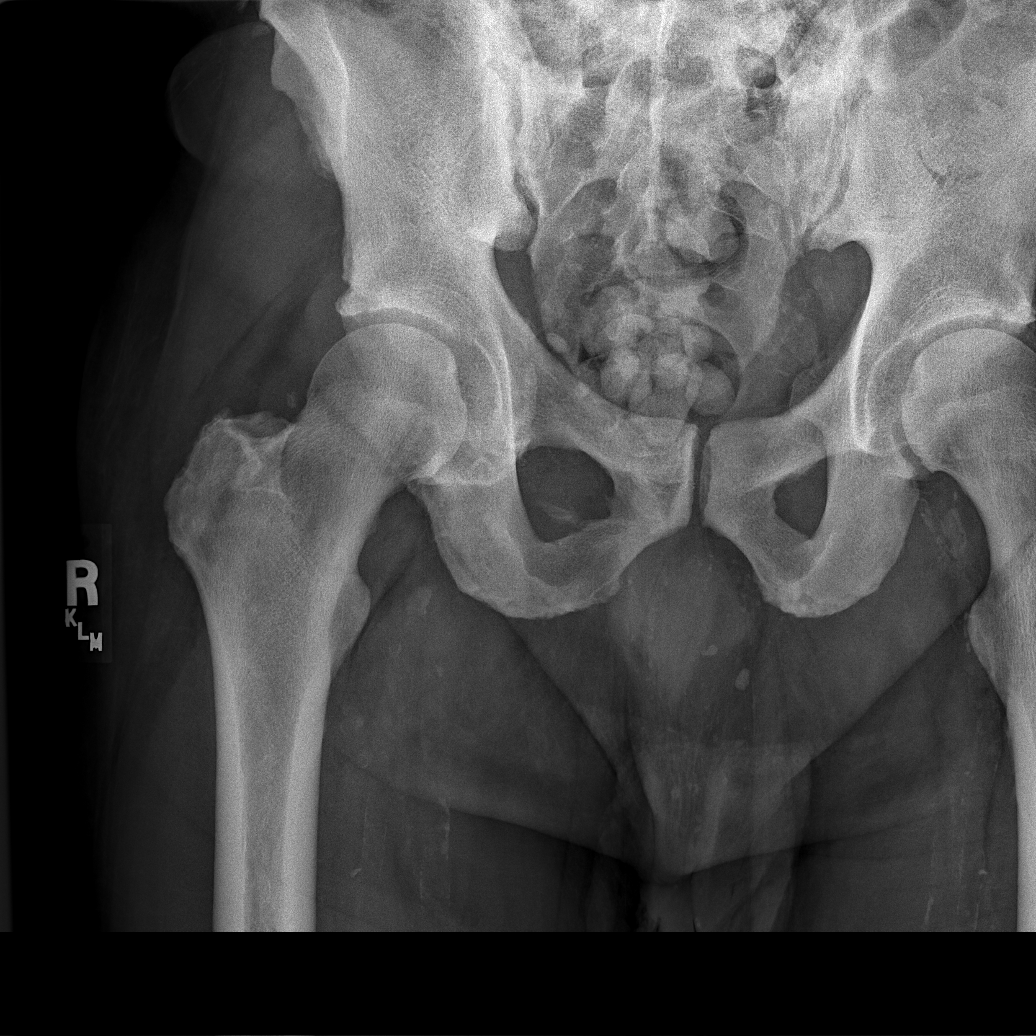

[4 of 4 positions shown; findings below may reference images not displayed]

FINDINGS: Frontal view of the pelvis as well as frontal and frogleg lateral
views of the left hip are obtained. No fracture, subluxation, or
dislocation. There is mild bilateral hip osteoarthritis left greater
than right. Sacroiliac joints are normal. Scattered vascular
calcifications are noted.
IMPRESSION: 1. Left greater than right hip osteoarthritis. No acute bony
abnormality.
